# Patient Record
Sex: Female | Born: 1975
Health system: Southern US, Community
[De-identification: ages and names within clinical notes are randomized; demographics above are authoritative.]

## PROBLEM LIST (undated history)

## (undated) DIAGNOSIS — F329 Major depressive disorder, single episode, unspecified: Secondary | ICD-10-CM

## (undated) DIAGNOSIS — M329 Systemic lupus erythematosus, unspecified: Secondary | ICD-10-CM

## (undated) DIAGNOSIS — F32A Depression, unspecified: Secondary | ICD-10-CM

## (undated) DIAGNOSIS — I82409 Acute embolism and thrombosis of unspecified deep veins of unspecified lower extremity: Secondary | ICD-10-CM

## (undated) DIAGNOSIS — N289 Disorder of kidney and ureter, unspecified: Secondary | ICD-10-CM

## (undated) DIAGNOSIS — N898 Other specified noninflammatory disorders of vagina: Secondary | ICD-10-CM

## (undated) DIAGNOSIS — I10 Essential (primary) hypertension: Secondary | ICD-10-CM

## (undated) DIAGNOSIS — N760 Acute vaginitis: Secondary | ICD-10-CM

## (undated) DIAGNOSIS — B9689 Other specified bacterial agents as the cause of diseases classified elsewhere: Secondary | ICD-10-CM

## (undated) DIAGNOSIS — Z992 Dependence on renal dialysis: Secondary | ICD-10-CM

## (undated) DIAGNOSIS — IMO0002 Reserved for concepts with insufficient information to code with codable children: Secondary | ICD-10-CM

## (undated) DIAGNOSIS — D649 Anemia, unspecified: Secondary | ICD-10-CM

## (undated) HISTORY — DX: Depression, unspecified: F32.A

## (undated) HISTORY — DX: Other specified noninflammatory disorders of vagina: N89.8

## (undated) HISTORY — DX: Other specified bacterial agents as the cause of diseases classified elsewhere: B96.89

## (undated) HISTORY — DX: Acute vaginitis: N76.0

## (undated) HISTORY — PX: AV FISTULA PLACEMENT: SHX1204

## (undated) HISTORY — DX: Major depressive disorder, single episode, unspecified: F32.9

---

## 2004-12-24 ENCOUNTER — Inpatient Hospital Stay (HOSPITAL_COMMUNITY): Admission: EM | Admit: 2004-12-24 | Discharge: 2004-12-30 | Payer: Self-pay | Admitting: Emergency Medicine

## 2005-01-06 ENCOUNTER — Encounter (HOSPITAL_COMMUNITY): Admission: RE | Admit: 2005-01-06 | Discharge: 2005-02-05 | Payer: Self-pay | Admitting: Internal Medicine

## 2005-01-28 ENCOUNTER — Ambulatory Visit: Payer: Self-pay | Admitting: *Deleted

## 2005-01-29 ENCOUNTER — Ambulatory Visit: Payer: Self-pay | Admitting: Oncology

## 2005-01-29 ENCOUNTER — Inpatient Hospital Stay (HOSPITAL_COMMUNITY): Admission: EM | Admit: 2005-01-29 | Discharge: 2005-01-31 | Payer: Self-pay | Admitting: Emergency Medicine

## 2005-01-29 ENCOUNTER — Ambulatory Visit: Payer: Self-pay | Admitting: *Deleted

## 2005-02-14 ENCOUNTER — Encounter (HOSPITAL_COMMUNITY): Admission: RE | Admit: 2005-02-14 | Discharge: 2005-03-07 | Payer: Self-pay | Admitting: Internal Medicine

## 2005-02-24 ENCOUNTER — Encounter (HOSPITAL_COMMUNITY): Admission: RE | Admit: 2005-02-24 | Discharge: 2005-02-24 | Payer: Self-pay | Admitting: Oncology

## 2005-02-24 ENCOUNTER — Ambulatory Visit (HOSPITAL_COMMUNITY): Payer: Self-pay | Admitting: Oncology

## 2005-02-24 ENCOUNTER — Encounter: Admission: RE | Admit: 2005-02-24 | Discharge: 2005-02-24 | Payer: Self-pay | Admitting: Oncology

## 2005-06-02 ENCOUNTER — Encounter: Admission: RE | Admit: 2005-06-02 | Discharge: 2005-06-02 | Payer: Self-pay | Admitting: Oncology

## 2005-06-02 ENCOUNTER — Ambulatory Visit (HOSPITAL_COMMUNITY): Payer: Self-pay | Admitting: Oncology

## 2005-06-02 ENCOUNTER — Encounter (HOSPITAL_COMMUNITY): Admission: RE | Admit: 2005-06-02 | Discharge: 2005-07-02 | Payer: Self-pay | Admitting: Oncology

## 2005-06-10 ENCOUNTER — Encounter (INDEPENDENT_AMBULATORY_CARE_PROVIDER_SITE_OTHER): Payer: Self-pay | Admitting: *Deleted

## 2005-06-10 ENCOUNTER — Ambulatory Visit: Payer: Self-pay | Admitting: Internal Medicine

## 2005-07-09 ENCOUNTER — Ambulatory Visit: Payer: Self-pay | Admitting: Internal Medicine

## 2005-08-12 ENCOUNTER — Ambulatory Visit: Payer: Self-pay | Admitting: Internal Medicine

## 2005-08-12 ENCOUNTER — Other Ambulatory Visit: Admission: RE | Admit: 2005-08-12 | Discharge: 2005-08-12 | Payer: Self-pay | Admitting: Internal Medicine

## 2005-08-25 ENCOUNTER — Emergency Department (HOSPITAL_COMMUNITY): Admission: EM | Admit: 2005-08-25 | Discharge: 2005-08-25 | Payer: Self-pay | Admitting: Emergency Medicine

## 2006-03-31 ENCOUNTER — Encounter: Payer: Self-pay | Admitting: Internal Medicine

## 2006-03-31 DIAGNOSIS — G43909 Migraine, unspecified, not intractable, without status migrainosus: Secondary | ICD-10-CM | POA: Insufficient documentation

## 2006-03-31 DIAGNOSIS — G609 Hereditary and idiopathic neuropathy, unspecified: Secondary | ICD-10-CM | POA: Insufficient documentation

## 2006-03-31 DIAGNOSIS — F329 Major depressive disorder, single episode, unspecified: Secondary | ICD-10-CM

## 2006-03-31 DIAGNOSIS — F3289 Other specified depressive episodes: Secondary | ICD-10-CM | POA: Insufficient documentation

## 2006-03-31 DIAGNOSIS — F418 Other specified anxiety disorders: Secondary | ICD-10-CM | POA: Insufficient documentation

## 2006-03-31 DIAGNOSIS — D7289 Other specified disorders of white blood cells: Secondary | ICD-10-CM | POA: Insufficient documentation

## 2006-03-31 DIAGNOSIS — M1990 Unspecified osteoarthritis, unspecified site: Secondary | ICD-10-CM | POA: Insufficient documentation

## 2006-03-31 DIAGNOSIS — R7989 Other specified abnormal findings of blood chemistry: Secondary | ICD-10-CM | POA: Insufficient documentation

## 2006-03-31 DIAGNOSIS — I10 Essential (primary) hypertension: Secondary | ICD-10-CM | POA: Insufficient documentation

## 2006-03-31 DIAGNOSIS — D649 Anemia, unspecified: Secondary | ICD-10-CM | POA: Insufficient documentation

## 2006-03-31 DIAGNOSIS — K219 Gastro-esophageal reflux disease without esophagitis: Secondary | ICD-10-CM | POA: Insufficient documentation

## 2006-04-01 DIAGNOSIS — M329 Systemic lupus erythematosus, unspecified: Secondary | ICD-10-CM | POA: Insufficient documentation

## 2006-04-19 ENCOUNTER — Observation Stay (HOSPITAL_COMMUNITY): Admission: EM | Admit: 2006-04-19 | Discharge: 2006-04-24 | Payer: Self-pay | Admitting: Emergency Medicine

## 2006-04-24 ENCOUNTER — Ambulatory Visit (HOSPITAL_COMMUNITY): Admission: RE | Admit: 2006-04-24 | Discharge: 2006-04-24 | Payer: Self-pay | Admitting: Internal Medicine

## 2006-04-29 ENCOUNTER — Inpatient Hospital Stay (HOSPITAL_COMMUNITY): Admission: EM | Admit: 2006-04-29 | Discharge: 2006-05-07 | Payer: Self-pay | Admitting: Emergency Medicine

## 2006-04-30 ENCOUNTER — Encounter (INDEPENDENT_AMBULATORY_CARE_PROVIDER_SITE_OTHER): Payer: Self-pay | Admitting: Internal Medicine

## 2006-05-01 ENCOUNTER — Encounter (INDEPENDENT_AMBULATORY_CARE_PROVIDER_SITE_OTHER): Payer: Self-pay | Admitting: Internal Medicine

## 2006-05-08 ENCOUNTER — Encounter (INDEPENDENT_AMBULATORY_CARE_PROVIDER_SITE_OTHER): Payer: Self-pay | Admitting: Internal Medicine

## 2006-05-11 ENCOUNTER — Telehealth (INDEPENDENT_AMBULATORY_CARE_PROVIDER_SITE_OTHER): Payer: Self-pay | Admitting: *Deleted

## 2006-05-14 ENCOUNTER — Encounter (INDEPENDENT_AMBULATORY_CARE_PROVIDER_SITE_OTHER): Payer: Self-pay | Admitting: Internal Medicine

## 2006-05-15 ENCOUNTER — Inpatient Hospital Stay (HOSPITAL_COMMUNITY): Admission: EM | Admit: 2006-05-15 | Discharge: 2006-05-22 | Payer: Self-pay | Admitting: Emergency Medicine

## 2006-05-15 ENCOUNTER — Ambulatory Visit: Payer: Self-pay | Admitting: Internal Medicine

## 2006-05-15 ENCOUNTER — Emergency Department (HOSPITAL_COMMUNITY): Admission: EM | Admit: 2006-05-15 | Discharge: 2006-05-15 | Payer: Self-pay | Admitting: Emergency Medicine

## 2006-05-15 DIAGNOSIS — I959 Hypotension, unspecified: Secondary | ICD-10-CM

## 2006-05-15 DIAGNOSIS — I319 Disease of pericardium, unspecified: Secondary | ICD-10-CM | POA: Insufficient documentation

## 2006-05-15 DIAGNOSIS — R000 Tachycardia, unspecified: Secondary | ICD-10-CM | POA: Insufficient documentation

## 2006-05-17 LAB — CONVERTED CEMR LAB
CO2: 20 meq/L
Creatinine, Ser: 0.65 mg/dL
GFR calc non Af Amer: >60 mL/min
Glucose, Bld: 69 mg/dL

## 2006-05-18 ENCOUNTER — Encounter (INDEPENDENT_AMBULATORY_CARE_PROVIDER_SITE_OTHER): Payer: Self-pay | Admitting: Internal Medicine

## 2006-05-18 ENCOUNTER — Ambulatory Visit: Payer: Self-pay | Admitting: Cardiology

## 2006-05-20 LAB — CONVERTED CEMR LAB
Basophils Relative: 0 %
Eosinophils Absolute: 0.1 10*3/uL
MCHC: 33.7 g/dL
MCV: 86.2 fL
Monocytes Relative: 10 %
Neutrophils Relative %: 57 %
Platelets: 340 10*3/uL
RBC: 3.56 M/uL

## 2006-05-25 ENCOUNTER — Telehealth (INDEPENDENT_AMBULATORY_CARE_PROVIDER_SITE_OTHER): Payer: Self-pay | Admitting: Internal Medicine

## 2006-05-26 ENCOUNTER — Ambulatory Visit: Payer: Self-pay | Admitting: Internal Medicine

## 2006-05-26 DIAGNOSIS — R630 Anorexia: Secondary | ICD-10-CM | POA: Insufficient documentation

## 2006-05-27 ENCOUNTER — Encounter (INDEPENDENT_AMBULATORY_CARE_PROVIDER_SITE_OTHER): Payer: Self-pay | Admitting: Internal Medicine

## 2006-05-29 ENCOUNTER — Encounter (INDEPENDENT_AMBULATORY_CARE_PROVIDER_SITE_OTHER): Payer: Self-pay | Admitting: Internal Medicine

## 2006-06-03 ENCOUNTER — Encounter (INDEPENDENT_AMBULATORY_CARE_PROVIDER_SITE_OTHER): Payer: Self-pay | Admitting: Internal Medicine

## 2006-06-08 ENCOUNTER — Encounter (INDEPENDENT_AMBULATORY_CARE_PROVIDER_SITE_OTHER): Payer: Self-pay | Admitting: Internal Medicine

## 2006-06-09 ENCOUNTER — Encounter (INDEPENDENT_AMBULATORY_CARE_PROVIDER_SITE_OTHER): Payer: Self-pay | Admitting: Internal Medicine

## 2006-06-09 ENCOUNTER — Telehealth (INDEPENDENT_AMBULATORY_CARE_PROVIDER_SITE_OTHER): Payer: Self-pay | Admitting: Internal Medicine

## 2006-06-23 ENCOUNTER — Encounter (INDEPENDENT_AMBULATORY_CARE_PROVIDER_SITE_OTHER): Payer: Self-pay | Admitting: Internal Medicine

## 2006-06-25 ENCOUNTER — Ambulatory Visit: Payer: Self-pay | Admitting: Internal Medicine

## 2006-06-25 ENCOUNTER — Inpatient Hospital Stay (HOSPITAL_COMMUNITY): Admission: EM | Admit: 2006-06-25 | Discharge: 2006-07-04 | Payer: Self-pay | Admitting: Emergency Medicine

## 2006-06-30 ENCOUNTER — Ambulatory Visit: Payer: Self-pay | Admitting: Internal Medicine

## 2006-07-01 ENCOUNTER — Ambulatory Visit: Payer: Self-pay | Admitting: Gastroenterology

## 2006-07-07 ENCOUNTER — Telehealth (INDEPENDENT_AMBULATORY_CARE_PROVIDER_SITE_OTHER): Payer: Self-pay | Admitting: Internal Medicine

## 2006-07-08 ENCOUNTER — Telehealth (INDEPENDENT_AMBULATORY_CARE_PROVIDER_SITE_OTHER): Payer: Self-pay | Admitting: Internal Medicine

## 2006-07-11 ENCOUNTER — Emergency Department (HOSPITAL_COMMUNITY): Admission: EM | Admit: 2006-07-11 | Discharge: 2006-07-11 | Payer: Self-pay | Admitting: Emergency Medicine

## 2006-07-13 ENCOUNTER — Encounter: Payer: Self-pay | Admitting: Internal Medicine

## 2006-07-14 ENCOUNTER — Encounter (INDEPENDENT_AMBULATORY_CARE_PROVIDER_SITE_OTHER): Payer: Self-pay | Admitting: Internal Medicine

## 2006-07-16 ENCOUNTER — Ambulatory Visit: Payer: Self-pay | Admitting: Internal Medicine

## 2006-07-16 DIAGNOSIS — R4182 Altered mental status, unspecified: Secondary | ICD-10-CM | POA: Insufficient documentation

## 2006-07-16 LAB — CONVERTED CEMR LAB
Calcium: 9 mg/dL (ref 8.4–10.5)
Creatinine, Ser: 0.59 mg/dL (ref 0.40–1.20)
Glucose, Bld: 77 mg/dL (ref 70–99)
Sodium: 137 meq/L (ref 135–145)

## 2006-07-17 ENCOUNTER — Encounter (INDEPENDENT_AMBULATORY_CARE_PROVIDER_SITE_OTHER): Payer: Self-pay | Admitting: Internal Medicine

## 2006-07-23 ENCOUNTER — Encounter (INDEPENDENT_AMBULATORY_CARE_PROVIDER_SITE_OTHER): Payer: Self-pay | Admitting: Internal Medicine

## 2006-07-24 ENCOUNTER — Ambulatory Visit: Payer: Self-pay | Admitting: Internal Medicine

## 2006-07-27 ENCOUNTER — Encounter (INDEPENDENT_AMBULATORY_CARE_PROVIDER_SITE_OTHER): Payer: Self-pay | Admitting: Internal Medicine

## 2006-07-27 ENCOUNTER — Telehealth (INDEPENDENT_AMBULATORY_CARE_PROVIDER_SITE_OTHER): Payer: Self-pay | Admitting: Internal Medicine

## 2006-07-28 ENCOUNTER — Encounter (INDEPENDENT_AMBULATORY_CARE_PROVIDER_SITE_OTHER): Payer: Self-pay | Admitting: Internal Medicine

## 2006-07-31 ENCOUNTER — Ambulatory Visit: Payer: Self-pay | Admitting: Internal Medicine

## 2006-08-04 ENCOUNTER — Encounter (INDEPENDENT_AMBULATORY_CARE_PROVIDER_SITE_OTHER): Payer: Self-pay | Admitting: Internal Medicine

## 2006-08-11 ENCOUNTER — Ambulatory Visit: Payer: Self-pay | Admitting: Internal Medicine

## 2006-08-14 ENCOUNTER — Encounter (INDEPENDENT_AMBULATORY_CARE_PROVIDER_SITE_OTHER): Payer: Self-pay | Admitting: Internal Medicine

## 2006-08-31 ENCOUNTER — Ambulatory Visit: Payer: Self-pay | Admitting: Internal Medicine

## 2006-08-31 LAB — CONVERTED CEMR LAB
ALT: 18 units/L (ref 0–35)
Albumin: 4.5 g/dL (ref 3.5–5.2)
CO2: 25 meq/L (ref 19–32)
Calcium: 10 mg/dL (ref 8.4–10.5)
Chloride: 106 meq/L (ref 96–112)
Eosinophils Relative: 0 % (ref 0–5)
Glucose, Bld: 59 mg/dL — ABNORMAL LOW (ref 70–99)
HCT: 45 % (ref 36.0–46.0)
Lymphocytes Relative: 20 % (ref 12–46)
Lymphs Abs: 1.7 10*3/uL (ref 0.7–3.3)
Neutro Abs: 5.8 10*3/uL (ref 1.7–7.7)
Neutrophils Relative %: 70 % (ref 43–77)
Platelets: 320 10*3/uL (ref 150–400)
Potassium: 4.3 meq/L (ref 3.5–5.3)
Sodium: 143 meq/L (ref 135–145)
TSH: 0.798 microintl units/mL (ref 0.350–5.50)
Total Protein: 7.3 g/dL (ref 6.0–8.3)
WBC: 8.3 10*3/uL (ref 4.0–10.5)

## 2006-09-17 ENCOUNTER — Telehealth (INDEPENDENT_AMBULATORY_CARE_PROVIDER_SITE_OTHER): Payer: Self-pay | Admitting: *Deleted

## 2006-09-22 ENCOUNTER — Encounter (INDEPENDENT_AMBULATORY_CARE_PROVIDER_SITE_OTHER): Payer: Self-pay | Admitting: Internal Medicine

## 2006-10-05 ENCOUNTER — Ambulatory Visit: Payer: Self-pay | Admitting: Internal Medicine

## 2006-10-13 ENCOUNTER — Encounter (INDEPENDENT_AMBULATORY_CARE_PROVIDER_SITE_OTHER): Payer: Self-pay | Admitting: Internal Medicine

## 2006-11-02 ENCOUNTER — Ambulatory Visit: Payer: Self-pay | Admitting: Internal Medicine

## 2006-11-23 ENCOUNTER — Encounter (INDEPENDENT_AMBULATORY_CARE_PROVIDER_SITE_OTHER): Payer: Self-pay | Admitting: Internal Medicine

## 2006-11-24 ENCOUNTER — Encounter (INDEPENDENT_AMBULATORY_CARE_PROVIDER_SITE_OTHER): Payer: Self-pay | Admitting: Internal Medicine

## 2006-11-26 ENCOUNTER — Telehealth (INDEPENDENT_AMBULATORY_CARE_PROVIDER_SITE_OTHER): Payer: Self-pay | Admitting: *Deleted

## 2006-12-01 ENCOUNTER — Encounter (INDEPENDENT_AMBULATORY_CARE_PROVIDER_SITE_OTHER): Payer: Self-pay | Admitting: Internal Medicine

## 2006-12-01 ENCOUNTER — Ambulatory Visit: Payer: Self-pay | Admitting: Internal Medicine

## 2006-12-01 ENCOUNTER — Telehealth (INDEPENDENT_AMBULATORY_CARE_PROVIDER_SITE_OTHER): Payer: Self-pay | Admitting: Internal Medicine

## 2006-12-01 ENCOUNTER — Other Ambulatory Visit: Admission: RE | Admit: 2006-12-01 | Discharge: 2006-12-01 | Payer: Self-pay | Admitting: Internal Medicine

## 2006-12-02 ENCOUNTER — Encounter (INDEPENDENT_AMBULATORY_CARE_PROVIDER_SITE_OTHER): Payer: Self-pay | Admitting: Internal Medicine

## 2006-12-02 ENCOUNTER — Ambulatory Visit (HOSPITAL_COMMUNITY): Payer: Self-pay | Admitting: Psychology

## 2006-12-04 ENCOUNTER — Telehealth (INDEPENDENT_AMBULATORY_CARE_PROVIDER_SITE_OTHER): Payer: Self-pay | Admitting: *Deleted

## 2006-12-09 ENCOUNTER — Telehealth (INDEPENDENT_AMBULATORY_CARE_PROVIDER_SITE_OTHER): Payer: Self-pay | Admitting: Internal Medicine

## 2006-12-12 ENCOUNTER — Encounter (INDEPENDENT_AMBULATORY_CARE_PROVIDER_SITE_OTHER): Payer: Self-pay | Admitting: Internal Medicine

## 2006-12-16 ENCOUNTER — Ambulatory Visit (HOSPITAL_COMMUNITY): Payer: Self-pay | Admitting: Psychology

## 2006-12-24 ENCOUNTER — Telehealth (INDEPENDENT_AMBULATORY_CARE_PROVIDER_SITE_OTHER): Payer: Self-pay | Admitting: *Deleted

## 2006-12-25 ENCOUNTER — Ambulatory Visit: Payer: Self-pay | Admitting: Internal Medicine

## 2006-12-30 ENCOUNTER — Emergency Department (HOSPITAL_COMMUNITY): Admission: EM | Admit: 2006-12-30 | Discharge: 2006-12-30 | Payer: Self-pay | Admitting: Emergency Medicine

## 2007-01-04 ENCOUNTER — Telehealth (INDEPENDENT_AMBULATORY_CARE_PROVIDER_SITE_OTHER): Payer: Self-pay | Admitting: *Deleted

## 2007-01-07 ENCOUNTER — Telehealth (INDEPENDENT_AMBULATORY_CARE_PROVIDER_SITE_OTHER): Payer: Self-pay | Admitting: *Deleted

## 2007-01-19 ENCOUNTER — Ambulatory Visit: Payer: Self-pay | Admitting: Internal Medicine

## 2007-01-19 DIAGNOSIS — F71 Moderate intellectual disabilities: Secondary | ICD-10-CM | POA: Insufficient documentation

## 2007-01-26 ENCOUNTER — Telehealth (INDEPENDENT_AMBULATORY_CARE_PROVIDER_SITE_OTHER): Payer: Self-pay | Admitting: Internal Medicine

## 2007-01-29 ENCOUNTER — Encounter (INDEPENDENT_AMBULATORY_CARE_PROVIDER_SITE_OTHER): Payer: Self-pay | Admitting: Internal Medicine

## 2007-02-04 ENCOUNTER — Telehealth (INDEPENDENT_AMBULATORY_CARE_PROVIDER_SITE_OTHER): Payer: Self-pay | Admitting: Internal Medicine

## 2007-02-09 ENCOUNTER — Encounter (INDEPENDENT_AMBULATORY_CARE_PROVIDER_SITE_OTHER): Payer: Self-pay | Admitting: Internal Medicine

## 2007-03-15 ENCOUNTER — Ambulatory Visit: Payer: Self-pay | Admitting: Internal Medicine

## 2007-03-15 DIAGNOSIS — R059 Cough, unspecified: Secondary | ICD-10-CM | POA: Insufficient documentation

## 2007-03-15 DIAGNOSIS — J45909 Unspecified asthma, uncomplicated: Secondary | ICD-10-CM | POA: Insufficient documentation

## 2007-03-15 DIAGNOSIS — R05 Cough: Secondary | ICD-10-CM

## 2007-03-15 DIAGNOSIS — M79609 Pain in unspecified limb: Secondary | ICD-10-CM | POA: Insufficient documentation

## 2007-03-23 ENCOUNTER — Ambulatory Visit (HOSPITAL_COMMUNITY): Admission: RE | Admit: 2007-03-23 | Discharge: 2007-03-23 | Payer: Self-pay | Admitting: Internal Medicine

## 2007-03-24 ENCOUNTER — Telehealth (INDEPENDENT_AMBULATORY_CARE_PROVIDER_SITE_OTHER): Payer: Self-pay | Admitting: *Deleted

## 2007-04-02 ENCOUNTER — Inpatient Hospital Stay (HOSPITAL_COMMUNITY): Admission: EM | Admit: 2007-04-02 | Discharge: 2007-04-23 | Payer: Self-pay

## 2007-04-02 ENCOUNTER — Encounter: Payer: Self-pay | Admitting: Emergency Medicine

## 2007-04-02 ENCOUNTER — Ambulatory Visit: Payer: Self-pay | Admitting: Internal Medicine

## 2007-04-02 ENCOUNTER — Telehealth (INDEPENDENT_AMBULATORY_CARE_PROVIDER_SITE_OTHER): Payer: Self-pay | Admitting: *Deleted

## 2007-04-02 DIAGNOSIS — R220 Localized swelling, mass and lump, head: Secondary | ICD-10-CM | POA: Insufficient documentation

## 2007-04-02 DIAGNOSIS — R221 Localized swelling, mass and lump, neck: Secondary | ICD-10-CM

## 2007-04-19 ENCOUNTER — Encounter: Payer: Self-pay | Admitting: Internal Medicine

## 2007-04-20 ENCOUNTER — Ambulatory Visit: Payer: Self-pay | Admitting: Internal Medicine

## 2007-04-28 ENCOUNTER — Telehealth (INDEPENDENT_AMBULATORY_CARE_PROVIDER_SITE_OTHER): Payer: Self-pay | Admitting: Internal Medicine

## 2007-04-30 ENCOUNTER — Telehealth (INDEPENDENT_AMBULATORY_CARE_PROVIDER_SITE_OTHER): Payer: Self-pay | Admitting: *Deleted

## 2007-05-14 ENCOUNTER — Encounter (HOSPITAL_COMMUNITY): Admission: RE | Admit: 2007-05-14 | Discharge: 2007-05-21 | Payer: Self-pay | Admitting: Nephrology

## 2007-08-25 ENCOUNTER — Ambulatory Visit: Payer: Self-pay | Admitting: Internal Medicine

## 2007-08-27 ENCOUNTER — Ambulatory Visit (HOSPITAL_COMMUNITY): Admission: RE | Admit: 2007-08-27 | Discharge: 2007-08-27 | Payer: Self-pay | Admitting: Internal Medicine

## 2007-09-02 ENCOUNTER — Encounter (HOSPITAL_COMMUNITY): Admission: RE | Admit: 2007-09-02 | Discharge: 2007-10-02 | Payer: Self-pay | Admitting: Internal Medicine

## 2007-09-06 ENCOUNTER — Emergency Department (HOSPITAL_COMMUNITY): Admission: EM | Admit: 2007-09-06 | Discharge: 2007-09-06 | Payer: Self-pay | Admitting: Emergency Medicine

## 2007-10-15 ENCOUNTER — Ambulatory Visit (HOSPITAL_COMMUNITY): Admission: RE | Admit: 2007-10-15 | Discharge: 2007-10-15 | Payer: Self-pay | Admitting: Internal Medicine

## 2007-10-28 ENCOUNTER — Inpatient Hospital Stay (HOSPITAL_COMMUNITY): Admission: AD | Admit: 2007-10-28 | Discharge: 2007-11-03 | Payer: Self-pay | Admitting: Internal Medicine

## 2007-12-14 ENCOUNTER — Ambulatory Visit (HOSPITAL_COMMUNITY): Admission: RE | Admit: 2007-12-14 | Discharge: 2007-12-14 | Payer: Self-pay | Admitting: Internal Medicine

## 2007-12-16 ENCOUNTER — Inpatient Hospital Stay (HOSPITAL_COMMUNITY): Admission: EM | Admit: 2007-12-16 | Discharge: 2007-12-28 | Payer: Self-pay | Admitting: Emergency Medicine

## 2007-12-21 ENCOUNTER — Other Ambulatory Visit: Payer: Self-pay | Admitting: Surgery

## 2007-12-21 ENCOUNTER — Ambulatory Visit: Payer: Self-pay | Admitting: Surgery

## 2007-12-31 ENCOUNTER — Inpatient Hospital Stay (HOSPITAL_COMMUNITY): Admission: EM | Admit: 2007-12-31 | Discharge: 2008-01-20 | Payer: Self-pay | Admitting: Emergency Medicine

## 2007-12-31 ENCOUNTER — Ambulatory Visit: Payer: Self-pay | Admitting: Cardiology

## 2008-01-11 ENCOUNTER — Encounter: Payer: Self-pay | Admitting: Cardiology

## 2008-01-11 ENCOUNTER — Ambulatory Visit: Payer: Self-pay | Admitting: Internal Medicine

## 2008-01-12 ENCOUNTER — Ambulatory Visit: Payer: Self-pay | Admitting: Internal Medicine

## 2008-01-15 ENCOUNTER — Ambulatory Visit: Payer: Self-pay | Admitting: Gastroenterology

## 2008-01-17 ENCOUNTER — Encounter: Payer: Self-pay | Admitting: Cardiology

## 2008-02-01 ENCOUNTER — Inpatient Hospital Stay (HOSPITAL_COMMUNITY): Admission: EM | Admit: 2008-02-01 | Discharge: 2008-02-09 | Payer: Self-pay | Admitting: Emergency Medicine

## 2008-02-03 ENCOUNTER — Encounter (INDEPENDENT_AMBULATORY_CARE_PROVIDER_SITE_OTHER): Payer: Self-pay | Admitting: Diagnostic Radiology

## 2008-02-09 ENCOUNTER — Inpatient Hospital Stay (HOSPITAL_COMMUNITY): Admission: EM | Admit: 2008-02-09 | Discharge: 2008-02-17 | Payer: Self-pay | Admitting: Emergency Medicine

## 2008-02-14 ENCOUNTER — Ambulatory Visit: Payer: Self-pay | Admitting: Internal Medicine

## 2008-02-15 ENCOUNTER — Ambulatory Visit: Payer: Self-pay | Admitting: Internal Medicine

## 2008-02-16 ENCOUNTER — Ambulatory Visit: Payer: Self-pay | Admitting: Internal Medicine

## 2008-04-10 ENCOUNTER — Telehealth (INDEPENDENT_AMBULATORY_CARE_PROVIDER_SITE_OTHER): Payer: Self-pay | Admitting: Internal Medicine

## 2008-04-13 ENCOUNTER — Encounter (INDEPENDENT_AMBULATORY_CARE_PROVIDER_SITE_OTHER): Payer: Self-pay | Admitting: Internal Medicine

## 2008-10-06 ENCOUNTER — Ambulatory Visit (HOSPITAL_COMMUNITY): Admission: RE | Admit: 2008-10-06 | Discharge: 2008-10-06 | Payer: Self-pay | Admitting: Neurosurgery

## 2009-01-29 ENCOUNTER — Ambulatory Visit (HOSPITAL_COMMUNITY): Admission: RE | Admit: 2009-01-29 | Discharge: 2009-01-29 | Payer: Self-pay | Admitting: Nephrology

## 2009-03-06 ENCOUNTER — Ambulatory Visit (HOSPITAL_COMMUNITY): Admission: RE | Admit: 2009-03-06 | Discharge: 2009-03-06 | Payer: Self-pay | Admitting: Nephrology

## 2009-06-26 ENCOUNTER — Ambulatory Visit (HOSPITAL_COMMUNITY): Admission: RE | Admit: 2009-06-26 | Discharge: 2009-06-26 | Payer: Self-pay | Admitting: Nephrology

## 2010-04-07 ENCOUNTER — Encounter: Payer: Self-pay | Admitting: Internal Medicine

## 2010-06-12 IMAGING — CR DG CHEST 1V PORT
1 series · 1 of 1 positions shown · non-contrast
Comparison: Chest radiograph 01/01/2008 and 12/23/2007.

CLINICAL DATA: Pneumonia.  On ventilator.

PORTABLE CHEST - 1 VIEW

[view not recorded]
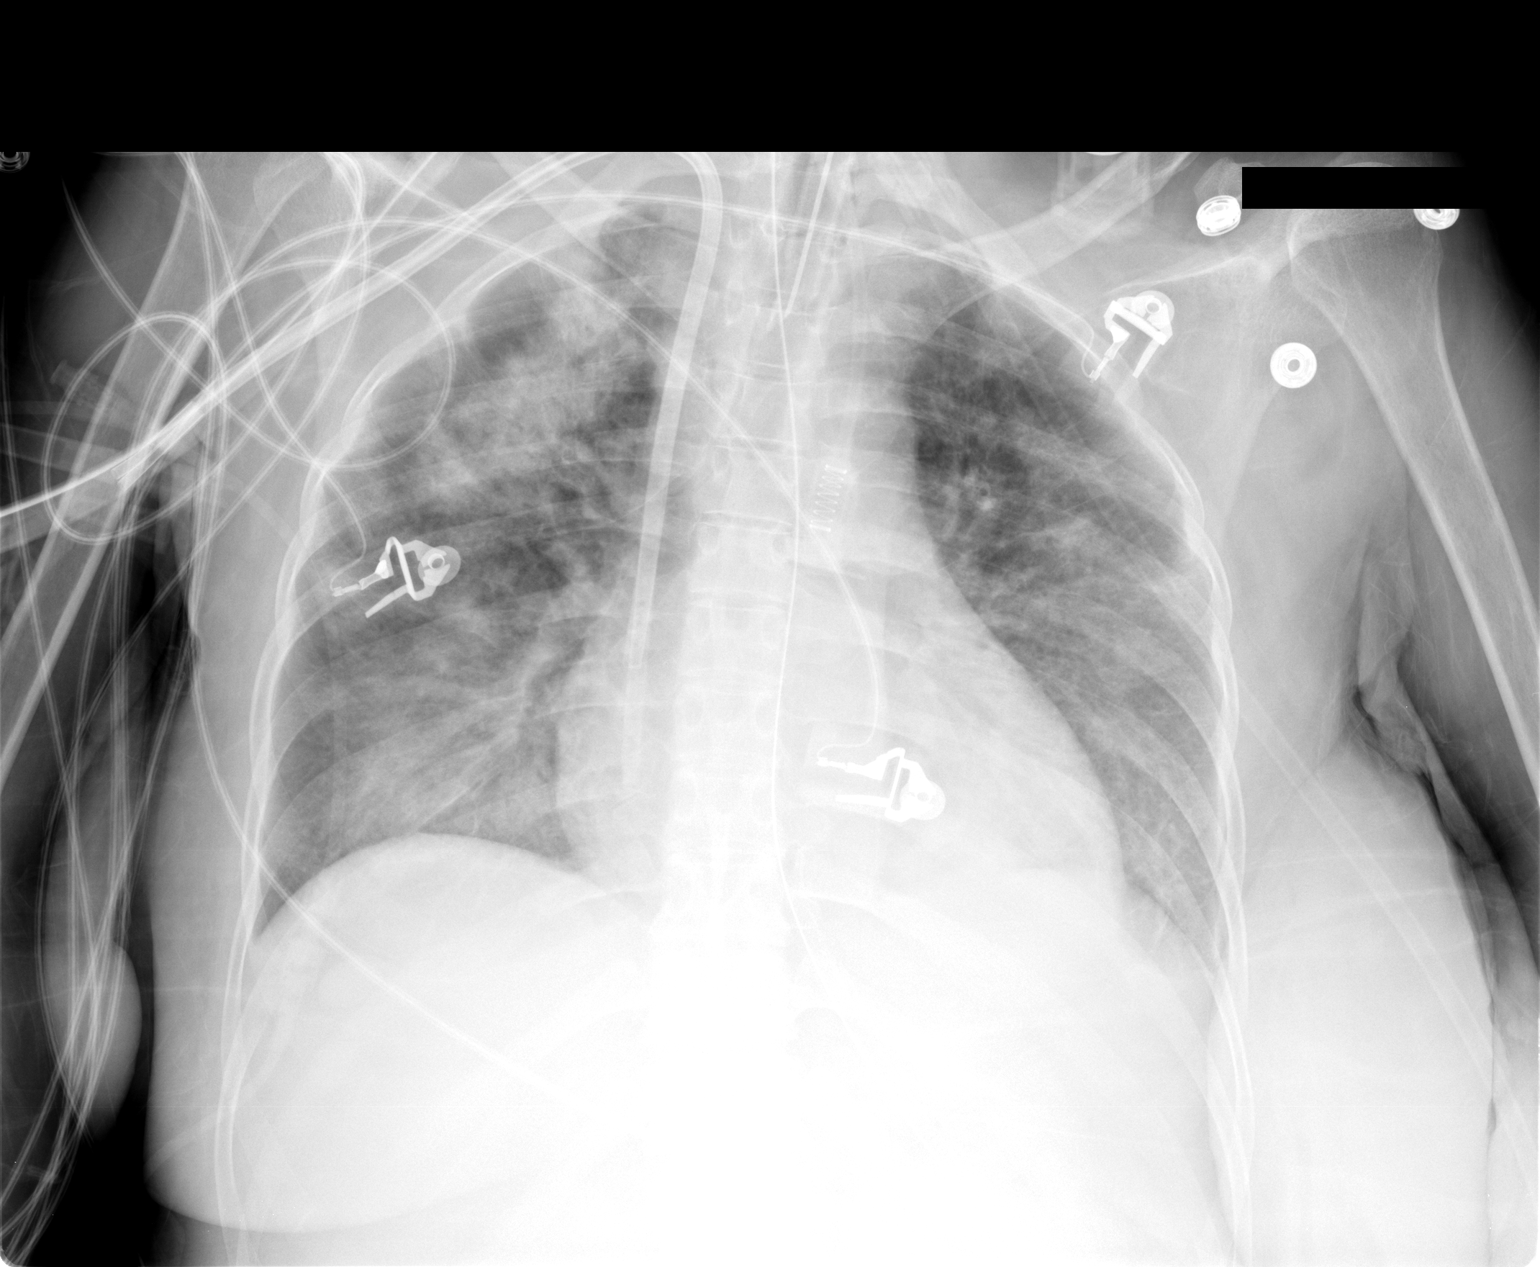

[1 of 1 positions shown; findings below may reference images not displayed]

FINDINGS: The endotracheal tube remains in satisfactory position,
with the tip at the level of the clavicular heads.  A right jugular
dialysis catheter is present, with one distal tip near the superior
cavoatrial junction and one tip in the right atrium.

A nasogastric tube has been placed, which courses into the stomach,
and continues below the edge of the image.

There has been interval improvement in airspace disease in the
right lung compared to 01/01/2008.  Currently, there is patchy
bilateral airspace disease, right greater than left.  Mild
pulmonary vascular congestion is present.  No definite pleural
effusion is appreciated.
IMPRESSION: 1.Bilateral airspace disease.  Improvement in aeration,
particularly in the right lung, compared to 01/01/2008.
2.  Nasogastric tube courses into the stomach but its distal tip is
not included on this image.

## 2010-06-14 IMAGING — CR DG CHEST 1V PORT
1 series · 1 of 1 positions shown · non-contrast
Comparison: 01/02/2008.

CLINICAL DATA: Pneumonia.

PORTABLE CHEST - 1 VIEW

[view not recorded]
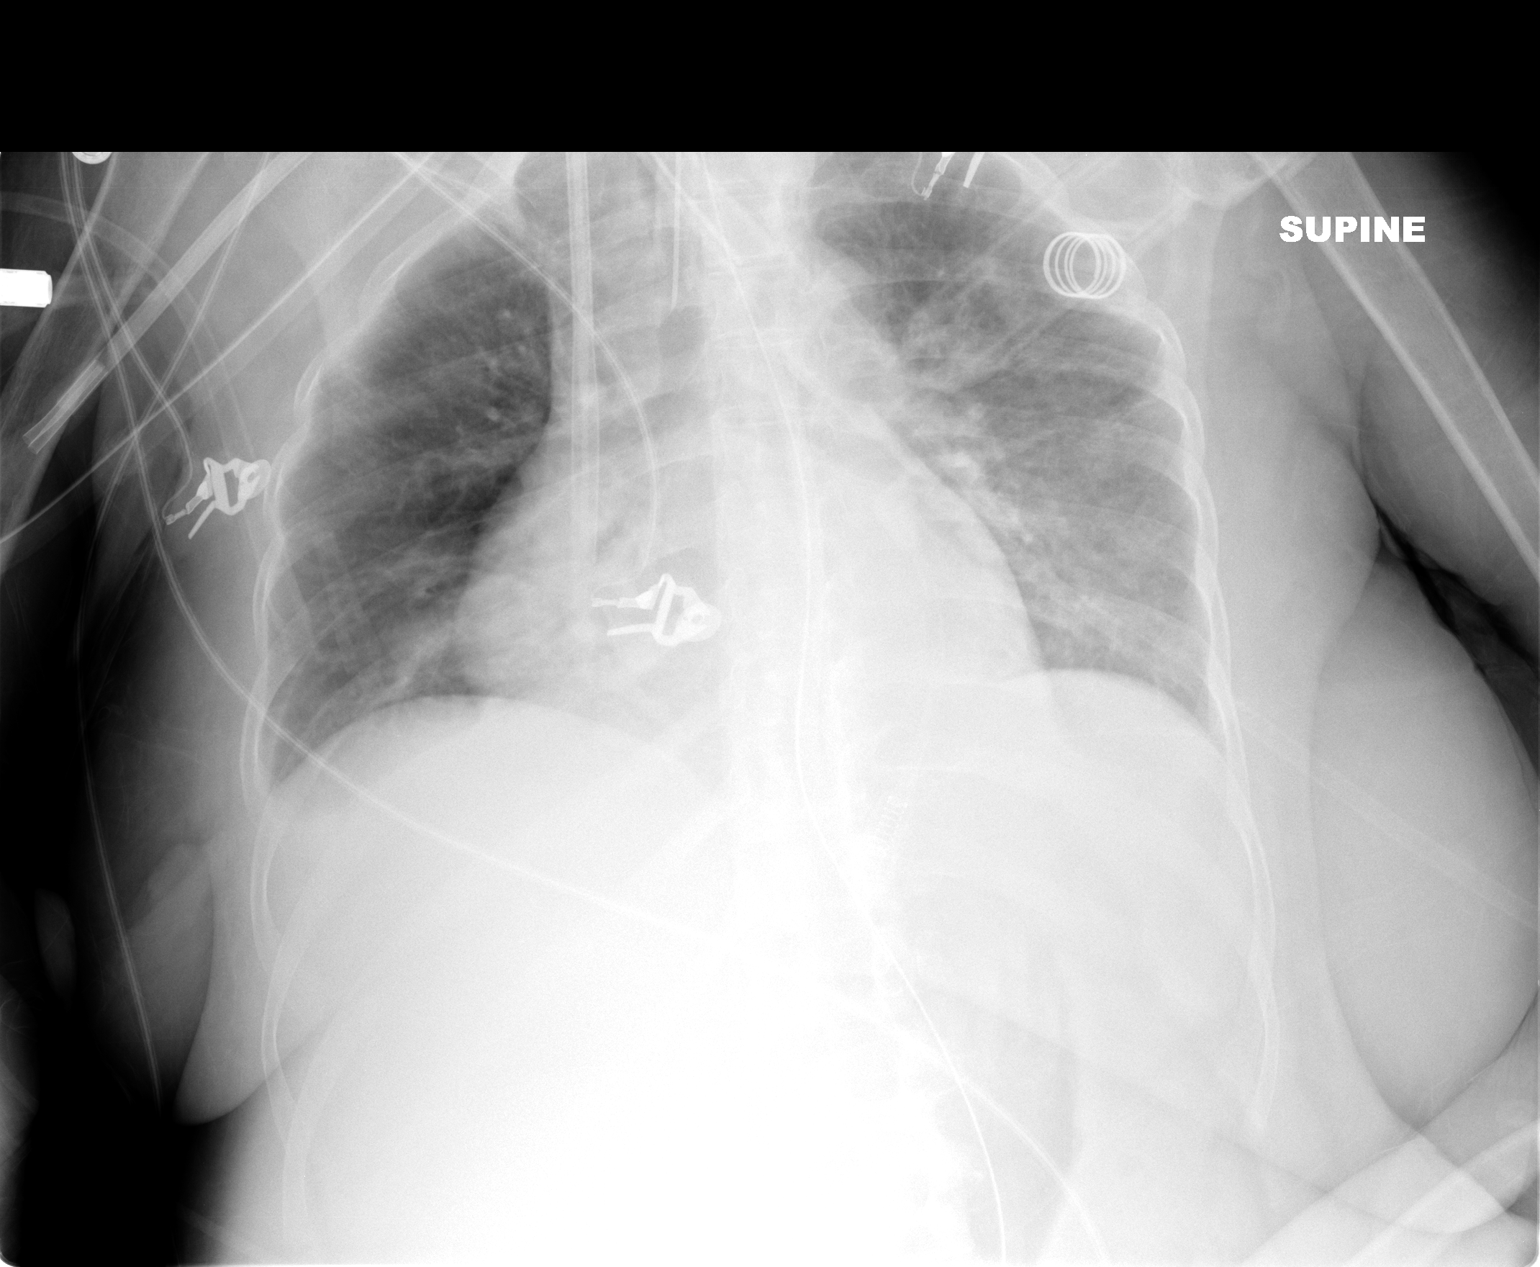

[1 of 1 positions shown; findings below may reference images not displayed]

FINDINGS: The patient remains intubated.  Endotracheal tube
terminates 3 cm above the carina.  An NG tube courses off the
inferior border the film.  The right IJ dialysis catheter is stable
position.  Previously seen airspace disease is mostly cleared.
Mild edema persists.  Minimal bibasilar atelectasis is noted.  The
patient is rotated to the right.
IMPRESSION: 1.  Interval resolution of right upper lobe airspace disease.
2.  Low lung volumes and mild edema persists.

## 2010-06-17 IMAGING — CR DG CHEST 1V PORT
1 series · 1 of 1 positions shown · non-contrast
Comparison: Portable chest 01/07/2008.

CLINICAL DATA: PICC placement.  Pneumonia.

PORTABLE CHEST - 1 VIEW

[view not recorded]
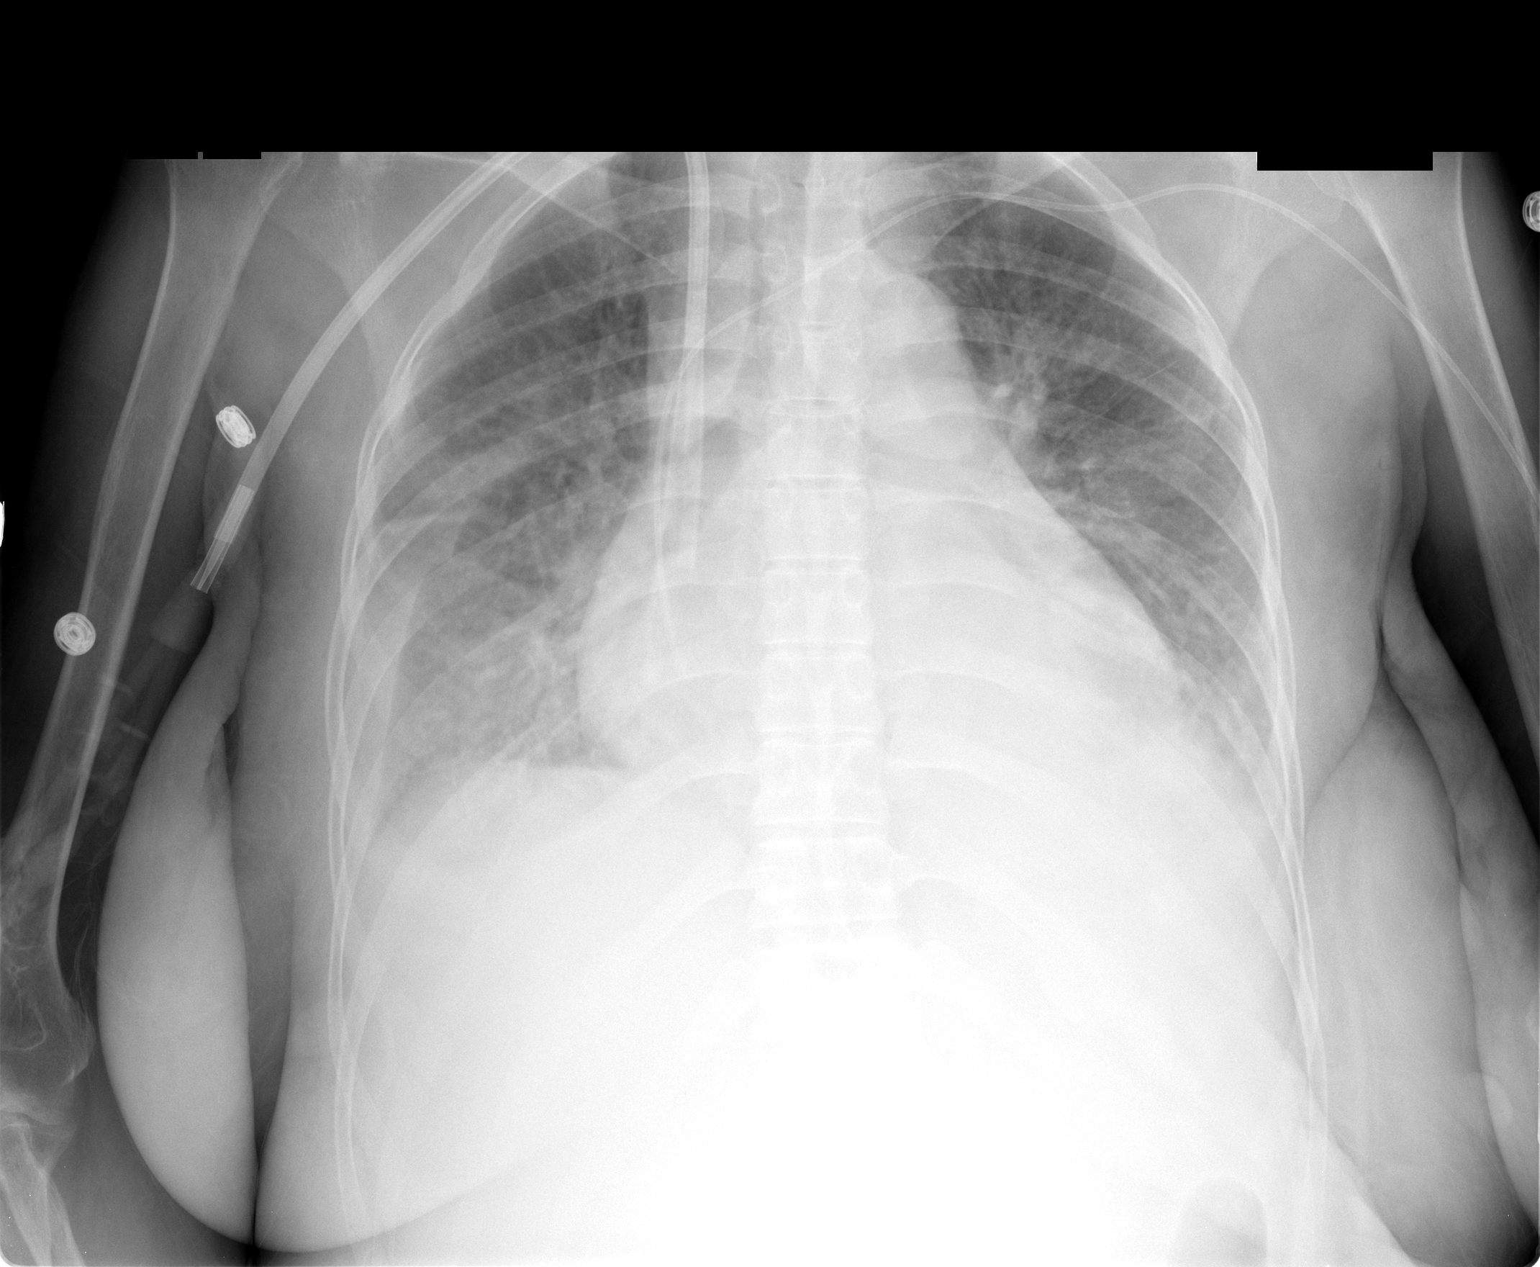

[1 of 1 positions shown; findings below may reference images not displayed]

FINDINGS: The patient has a new left PICC with the tip in the right
atrium.  The catheter should be withdrawn approximately 4 cm for
better positioning.  There is no pneumothorax.

There has been interval progression in bibasilar airspace disease
and a right pleural effusion.  Enlargement of the cardiopericardial
silhouette again noted.
IMPRESSION: 1.  Left PICC has its tip in the right atrium and should be
withdrawn approximately 4 cm.
2.  Some increase in bibasilar airspace disease and small right
effusion.

## 2010-06-19 LAB — GLUCOSE, CAPILLARY: Glucose-Capillary: 76 mg/dL (ref 70–99)

## 2010-06-22 IMAGING — CR DG CHEST 1V PORT
1 series · 1 of 1 positions shown · non-contrast
Comparison: 01/07/2008

CLINICAL DATA: Tube / catheter placement.  PICC placement.

PORTABLE CHEST - 1 VIEW

[view not recorded]
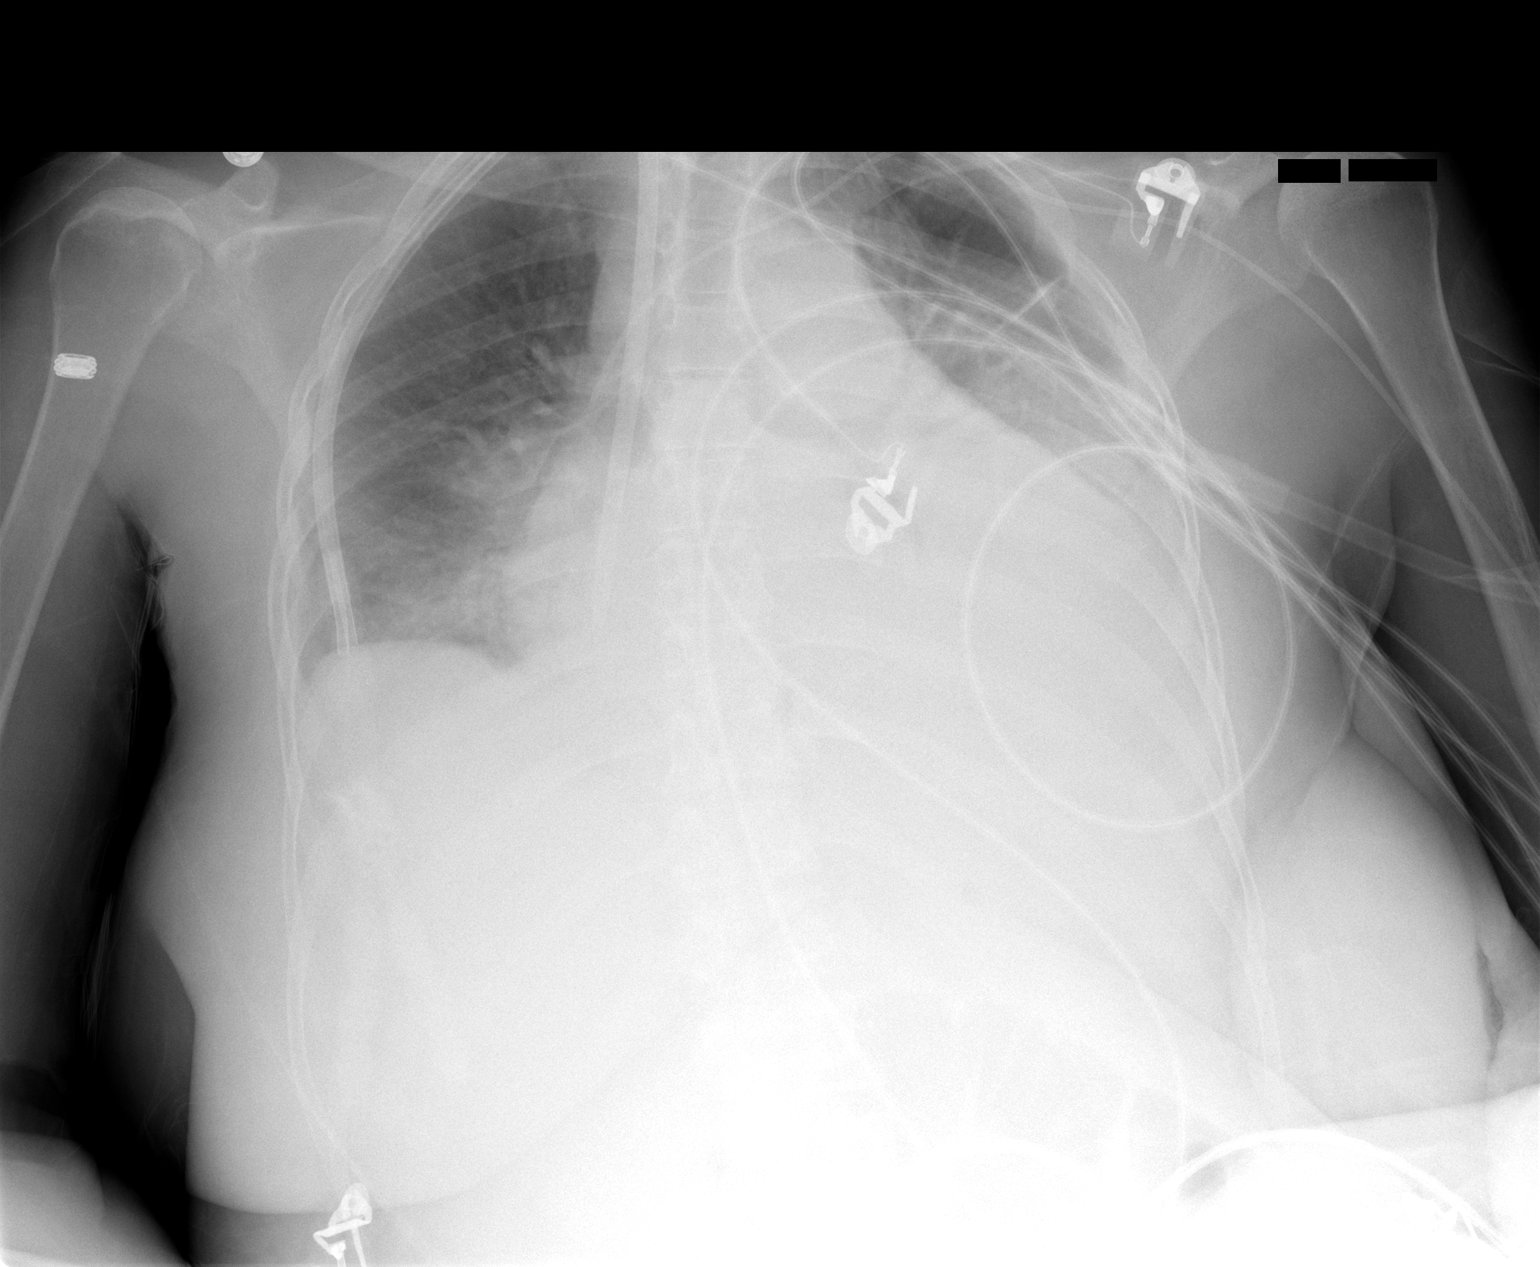

[1 of 1 positions shown; findings below may reference images not displayed]

FINDINGS: Trachea is midline.  Heart is enlarged, stable.  Right IJ
catheter tip projects over the right atrium.  Left PICC tip
projects over the SVC.

Lungs are low in volume with interstitial prominence and
indistinctness.  There is increasing left lower lobe consolidation.
Left pleural effusion.
IMPRESSION: 1.  Congestive heart failure.
2.  Worsening left lower lobe air space consolidation.  Pneumonia
is considered.

## 2010-06-23 LAB — CBC
MCHC: 33 g/dL (ref 30.0–36.0)
MCV: 90 fL (ref 78.0–100.0)
RDW: 16.1 % — ABNORMAL HIGH (ref 11.5–15.5)

## 2010-06-23 LAB — COMPREHENSIVE METABOLIC PANEL
ALT: 129 U/L — ABNORMAL HIGH (ref 0–35)
AST: 93 U/L — ABNORMAL HIGH (ref 0–37)
Calcium: 9 mg/dL (ref 8.4–10.5)
Creatinine, Ser: 2.67 mg/dL — ABNORMAL HIGH (ref 0.4–1.2)
GFR calc Af Amer: 25 mL/min — ABNORMAL LOW (ref >60)
GFR calc non Af Amer: 21 mL/min — ABNORMAL LOW (ref >60)
Glucose, Bld: 74 mg/dL (ref 70–99)
Sodium: 138 mEq/L (ref 135–145)
Total Protein: 7.1 g/dL (ref 6.0–8.3)

## 2010-06-23 LAB — PROTIME-INR: Prothrombin Time: 15 seconds (ref 11.6–15.2)

## 2010-06-23 LAB — APTT: aPTT: 42 seconds — ABNORMAL HIGH (ref 24–37)

## 2010-07-30 NOTE — Group Therapy Note (Signed)
[name], [name]             ACCOUNT NO.:  [id number]   MEDICAL RECORD NO.:  [id number]          PATIENT TYPE:  INP   LOCATION:  IC06                          FACILITY:  [facility]   PHYSICIAN:  Edward L. Luan Pulling, M.D.DATE OF BIRTH:  [birth year]   DATE OF PROCEDURE:  01/05/2008  DATE OF DISCHARGE:                                 PROGRESS NOTE   Ms. [name] was able to be successfully extubated yesterday, and she has  done well so far.  No new problems have been noted overnight.  She is  awake, responsive, although she is still somewhat slow.  Her pulse is  113.  Her blood pressure is 158/84.  Her chest is much clearer than  before.  Her heart is regular.  Her abdomen is soft.  Her phosphorus is  3.6.  Her white count is 17,000, hemoglobin is 9.5, platelets 203.  BMET, BUN is 31, creatinine is 2.16.  Respiratory culture, no organism  seen.   ASSESSMENT:  She is, I think, generally better.  She has had some  problems with volume overload related to her renal failure.  She, of  course, has renal failure based on her systemic lupus erythematosus.  She has had to be intubated and ventilated twice now in the last month  or so.  She has improved and plans are to continue with her treatments,  medications, and followup.      Edward L. Luan Pulling, M.D.  Electronically Signed     ELH/MEDQ  D:  01/05/2008  T:  01/05/2008  Job:  [id number]

## 2010-07-30 NOTE — Consult Note (Signed)
[name], [name]             ACCOUNT NO.:  [id number]   MEDICAL RECORD NO.:  [id number]          PATIENT TYPE:  AMB   LOCATION:  SDS                          FACILITY:  [facility]   PHYSICIAN:  Edward L. Luan Pulling, M.D.DATE OF BIRTH:  [birth year]   DATE OF CONSULTATION:  DATE OF DISCHARGE:  12/21/2007                                 CONSULTATION   REFERRING PHYSICIAN:  Tesfaye D. Legrand Rams, MD   REASON FOR CONSULTATION:  Respiratory failure.   SUBJECTIVE:  I saw [name] briefly last night when she had come back  from having a dialysis catheter placed and then had seizures.  [facility] was called, I was on the floor, so I asked them take her to the  intensive care unit for some labs, etc., and talked to Dr. Legrand Rams, but  then once she got to the ICU, she was essentially apneic and had to be  intubated and placed on mechanical ventilation.  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  When she was admitted to the hospital, she had a hemoglobin  of 6.9, hematocrit of 22.9, and worsened renal function.  She was  admitted on the first.   Her medications at the nursing home are metoprolol 100 mg b.i.d., Megace  400 mg b.i.d., Prilosec 20 mg daily, niacin 500 mg daily, Zoloft 150 mg  daily, ergocalciferol 50,000 units subcu weekly, MiraLax 17 g daily,  Norvasc 10 mg daily, Neurontin 100 mg daily, Plaquenil 200 mg daily,  multiple vitamin daily, Prostat 30 mL b.i.d., calcium carbonate 500 mg  t.i.d., Remeron which is 50 mg at bedtime, I am not sure if that is the  correct dose, Zaroxolyn 5 mg daily,  Demadex 60 mg b.i.d., Ensure 240 mL  b.i.d., oxycodone as needed for pain, and Anusol-HC ointment.   SOCIAL HISTORY:  She lives at [place].  She has been in the hospital  recently.  She does not use alcohol, tobacco, or illicit drugs.   PHYSICAL EXAMINATION:  GENERAL:  Now shows that she is sedated,  intubated.  She is on mechanical ventilation.  VITAL SIGNS:  Her heart rate is in the 90s, blood pressure 116/78,  respirations are 16, O2 sats 100% on the ventilator.  She is not very  responsive.  CHEST:  Shows rhonchi bilaterally.  HEART:  Regular without a gallop.  ABDOMEN:  Soft.  She has anasarca with diffuse swelling essentially  every where.   Her lab work this morning, blood gas about 5:15 show on a rate of 12, 5  of PEEP, tidal volume of 550% shows pH 7.35, pCO2 of 26, pO2 of 218.  White count is 19,300, hemoglobin is 11.4, platelets 166, and BMET shows  a BUN of 97, creatinine 4.33.  Chest x-ray post intubation shows  endotracheal tubes in good position.  The PICC line and dual-lumen  central venous catheter in good position, some atelectasis in the left  base.  CT head, no acute intracranial abnormality.   ASSESSMENT AND PLAN:  She has respiratory failure.  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  Plan is for her to have dialysis today and that should make it easier  for her to come off of the ventilator.  She is not going to be able to  be extubated today, I am certain, but if she can have dialysis and have  some fluid taken off, it will make things easier.      Edward L. Luan Pulling, M.D.  Electronically Signed     ELH/MEDQ  D:  12/22/2007  T:  12/22/2007  Job:  [id number]

## 2010-07-30 NOTE — Group Therapy Note (Signed)
NAMESHAWNDRIKA, BRINSON             ACCOUNT NO.:  [id number]   MEDICAL RECORD NO.:  [id number]          PATIENT TYPE:  INP   LOCATION:  A319                          FACILITY:  [facility]   PHYSICIAN:  Edward L. Luan Pulling, M.D.DATE OF BIRTH:  12-29-1975   DATE OF PROCEDURE:  DATE OF DISCHARGE:                                 PROGRESS NOTE   Patient of Dr. Josephine Cables.   Ms. Desanctis is overall I think about the same.  She still says she is  uncomfortable.   Her exam shows her temperature is 99.9, pulse 120, respirations 20,  blood pressure 125/93, O2 sats 96%.  Her chest shows some rhonchi, not a  great deal.  Her heart is regular.  Her abdomen is soft.   ASSESSMENT:  I think things are about the same.   PLAN:  Continue with the treatments, dialysis, etc., as needed.      Edward L. Luan Pulling, M.D.  Electronically Signed     ELH/MEDQ  D:  01/09/2008  T:  01/09/2008  Job:  [id number]

## 2010-07-30 NOTE — Procedures (Signed)
[name], [name]             ACCOUNT NO.:  [id number]   MEDICAL RECORD NO.:  [id number]          PATIENT TYPE:  INP   LOCATION:  IC03                          [facility]   PHYSICIAN:  Kofi A. Merlene Laughter, M.D. DATE OF BIRTH:  [birth year]   DATE OF PROCEDURE:  DATE OF DISCHARGE:                              EEG INTERPRETATION   HISTORY:  This is a 35 year old lady who presents with multiple  seizures.  She had a history of lupus.   MEDICATIONS:  Norvasc, Protonix, Megace, RenoCal, Zoloft, niacin,  Remeron, Lopressor, Neurontin, Lasix, Ativan, Diprivan, and Dilantin.   ANALYSIS:  A 16-channel recording is conducted using standard 10/20  measurements.  They recorded for 24 minutes.  There is a beta background  activity of 12 hertz.  Higher beta activity is observed in the frontal  area.  Photic stimulation is carried out without significant changes in  the background activity.  There is no focal or lateralized slowing.  There is no epileptiform activity observed.   IMPRESSION:  This recording is unremarkable.  There is no evidence of  epileptiform activity.  A single recording, however, does not rule out  epileptic seizures.      Kofi A. Merlene Laughter, M.D.  Electronically Signed     KAD/MEDQ  D:  12/23/2007  T:  12/23/2007  Job:  [id number]

## 2010-07-30 NOTE — Group Therapy Note (Signed)
NAMEJULIA, HUISMAN             ACCOUNT NO.:  [id number]   MEDICAL RECORD NO.:  [id number]          PATIENT TYPE:  INP   LOCATION:  IC03                          [facility]ILITY:  [facility]   PHYSICIAN:  Edward L. Luan Pulling, M.D.DATE OF BIRTH:  December 07, 1975   DATE OF PROCEDURE:  DATE OF DISCHARGE:                                 PROGRESS NOTE   Ms. Korzeniewski is, I think about, the same.  She is sluggish, but  arousable.  She has had no new complaints or problems noted at this  point.  She remains in the ICU.  She did get a feeding tube placed  yesterday and she started on nutritional replacement per NG.   PHYSICAL EXAMINATION:  VITAL SIGNS:  Her blood pressure 107/68 and pulse  111.  GENERAL:  She is afebrile.  CHEST:  Decreased breath sounds, some rhonchi.  HEART:  Regular with a mild tachycardia.  ABDOMEN:  Soft.   LABORATORY STUDIES:  Blood culture from the February 20, 2008, is  negative.  Her CBC this morning shows white count 19,800, which is about  the same.  Hemoglobin is 8.6.  She very well requires some blood.  Platelets 607.  BMET shows a potassium of 3.4, BUN at 12, and creatinine  2.39.   ASSESSMENT:  She has systemic lupus erythematosus with multiple involved  organ systems.  She has a chronic renal failure on dialysis.  She is  mildly hypokalemic.  She has very poor nutrition with an albumin level  yesterday at 1.  She is anemic and I will hold on blood transfusion  unless she gets below 8, but tomorrow we will go ahead and have her take  blood and go ahead and type and cross her as well.  We will plan to  continue with her antibiotics.  We do not have a source of her fever,  etc.   PLAN:  To continue with meds and follow.      Edward L. Luan Pulling, M.D.  Electronically Signed     ELH/MEDQ  D:  02/13/2008  T:  02/13/2008  Job:  [id number]

## 2010-07-30 NOTE — Discharge Summary (Signed)
NAMESHANNELL, MARICLE             ACCOUNT NO.:  [id number]   MEDICAL RECORD NO.:  [id number]          PATIENT TYPE:  INP   LOCATION:  A310                          FACILITY:  [facility]   PHYSICIAN:  Tesfaye D. Legrand Rams, MD   DATE OF BIRTH:  [birth year]   DATE OF ADMISSION:  12/31/2007  DATE OF DISCHARGE:  11/05/2009LH                               DISCHARGE SUMMARY   DISCHARGE DIAGNOSES:  1. Status post ventilatory dependent respiratory failure.  2. Pneumonia.  3. Sepsis.  4. Urinary tract infection.  5. Gastrointestinal bleed.  6. Anemia secondary to the above.  7. Chronic renal failure, on hemodialysis.  8. Seizure disorder.  9. Systemic lupus erythematosus.  10.Protein calorie malnutrition.  11.Anxiety/depression disorder.  12.Multiple electrolyte abnormalities.  13.Tachyarrhythmia.  14.Pericardial effusion.   DISCHARGE MEDICATIONS:  1. DuoNeb q.4 h. p.r.n.  2. Calcium acetate 667 mg 1 tablet t.i.d.  3. Plaquenil 200 mg daily.  4. Megace 400 mg b.i.d.  5. Remeron 15 mg q.h.s.  6. Multivitamin 1 tablet daily.  7. Renagel 800 mg a.c. and q.h.s.  8. Zoloft 175 mg daily.  9. Dilantin 200 mg b.i.d.  10.Neurontin 100 mg daily.  11.Lopressor 150 mg b.i.d.  12.Cardizem 240 mg p.o. daily.  13.Protonix 40 mg p.o. b.i.d.  14.Dulcolax suppository 10 mg per rectum p.r.n.  15.MiraLax 17 grams p.o. daily as needed.  16.Tylenol 650 q.4 h p.r.n.  17.Lortab 5/500 1 tablet q.6 h p.r.n.   DISPOSITION:  The patient will be discharged to [facility] home.   HOSPITAL COURSE:  This is a 35 year old female patient with a  complicated medical illness who was admitted to [facility] on  December 31, 2007 due to fever and chills.  The patient was in and out of  the hospital due to multiple problems.  She was recently started on  hemodialysis.  On admission the patient was found to have a left lower  lobe pneumonia.  She was started on IV antibiotics and was admitted to  the floor.   While the patient was on the floor she became severely  hypoxic and she was moved to the ICU and was intubated.  The patient was  managed on ventilatory support and later she was able to be weaned.  She  was continued on IV antibiotics and hemodialysis.  The patient had a  prolonged hospital stay.  She also suddenly developed a massive GI  bleed, for which she was moved back to the ICU and required about 11  units of blood transfusion.  GI consult was done and the patient was  evaluated and over the hospital stay her bleeding stopped and the  patient gradually improved.  The patient is currently able to tolerate  her hemodialysis in a dialysis chair.  She is off antibiotics and she is  gradually improving.  The patient will be discharged back to the nursing  home to continue her medication and outpatient dialysis.      Tesfaye D. Legrand Rams, MD  Electronically Signed     TDF/MEDQ  D:  01/20/2008  T:  01/20/2008  Job:  [id number]

## 2010-07-30 NOTE — H&P (Signed)
NAMENICHOLAS, [name]             ACCOUNT NO.:  [id number]   MEDICAL RECORD NO.:  [id number]          PATIENT TYPE:  INP   LOCATION:  A317                          FACILITY:  [facility]   PHYSICIAN:  Tesfaye D. Legrand Rams, MD   DATE OF BIRTH:  [birth year]   DATE OF ADMISSION:  10/28/2007  DATE OF DISCHARGE:  LH                              HISTORY & PHYSICAL   CHIEF COMPLAINT:  1. Low hemoglobin and hematocrit.  2. Generalized edema.   HISTORY OF PRESENT ILLNESS:  This is a 35 years old female patient with  history of chronic renal failure secondary to lupus and nephropathy was  directly admitted after the patient was found to have a hemoglobin of  7.9 with hematocrit of 23.  The patient also had progressively worsening  generalized edema in spite of high-dose diuretics.  The patient was  recently discharged from [facility] after she was treated for  hypercalcemia.   REVIEW OF SYSTEMS:  No fever, chills, cough, shortness of breath, chest  pain, nausea, vomiting, abdominal pain, dysuria, urgency, or frequency  of urination.   PAST MEDICAL HISTORY:  1. Lupus nephropathy.  2. Chronic renal failure secondary to the above.  3. Hypercalcemia.  4. Depression disorder.  5. Anemia.  6. Protein calorie malnutrition.  7. Generalized deconditioning.   CURRENT MEDICATIONS:  1. Vitamin D3 50,000 units q.4-hour 4 weeks.  2. Prednisolone 20 mg daily for 2 days, then 10 mg p.o. daily for      another 2 days.  3. Aranesp 40 mcg every weekly.  4. MiraLax 17 g daily.  5. Norvasc 10 mg daily.  6. Gabapentin 100 mg daily.  7. Calcium with vitamin D 1 tablet t.i.d.  8. Multivitamin 1 daily.  9. Hydroxychloroquine 200 mg daily.  10.Megace 40 mg daily.  11.Metoprolol 100 mg b.i.d.  12.Omeprazole 20 mg daily.  13.Bicarbonate 650 mg t.i.d.  14.Niacin 500 mg at bedtime.  15.Dulcolax 10 mg p.r.n.  16.Lasix 80 mg b.i.d.  17.Zoloft 150 mg daily.   SOCIAL HISTORY:  The patient has been a resident  of nursing home.  She  is disabled due to her illness.  No history of alcohol, tobacco, or  substance abuse.   PHYSICAL EXAMINATION:  GENERAL:  The patient is alert and awake with  chronically sick-looking.  VITAL SIGNS:  Blood pressure 113/80, pulse 98, respiratory rate 16, and  temperature 98.4 degrees Fahrenheit.  HEENT:  Pupils are equal and reactive.  NECK:  Supple.  CHEST:  Poor air entry.  Bilateral rhonchi.  CARDIOVASCULAR SYSTEM:  First and second heart sound heard.  No murmur  and no gallop.  ABDOMEN:  Soft and lax.  Bowel sound is positive.  No mass or no  organomegaly.  EXTREMITIES:  The patient has a generalized anasarca.   LABS:  On admission, CBC repeat after admission, WBC is 20.7, hemoglobin  9.2, hematocrit 27.8, platelet 200,000.  BMP, sodium 136, potassium 5.1,  chloride 100, carbon dioxide 22, glucose 154, BUN 29, creatinine 2.0,  and calcium 7.0.   ASSESSMENT:  1. Chronic renal failure secondary to lupus nephropathy  with      generalized anasarca.  2. Anemia.  3. Protein calorie malnutrition.  4. Generalized deconditioning.  5. History of hypercalcemia.  6. Anxiety, depression disorder.   PLAN:  We will hold planned transfusion at this time, since repeat  hemoglobin is 9.  We will continue the patient on IV diuretics.  We will  do nephrology consult.  Will monitor CBC and BMP.      Tesfaye D. Legrand Rams, MD  Electronically Signed     TDF/MEDQ  D:  10/28/2007  T:  10/29/2007  Job:  [id number]

## 2010-07-30 NOTE — Group Therapy Note (Signed)
NAMEYITTY, DACRES             ACCOUNT NO.:  [id number]   MEDICAL RECORD NO.:  [id number]          PATIENT TYPE:  INP   LOCATION:  IC06                          FACILITY:  [facility]   PHYSICIAN:  Edward L. Luan Pulling, M.D.DATE OF BIRTH:  1975-10-10   DATE OF PROCEDURE:  DATE OF DISCHARGE:                                 PROGRESS NOTE   The patient of Dr. Josephine Cables.   Ms. Beerman was brought back in over the weekend with increasing  problems with shortness of breath.  She ended up having to be intubated  again.  She reports she has renal failure, she has systemic lupus, and  she has had to have dialysis again.  She has Gram-negative rods in her  urine.  The etiology is not known yet, but she is on antibiotics, she  had covered that I think.   Her exam, her heart rate is about 100, blood pressure 135/70,  respirations 14.  Her chest is fairly clear with some rhonchi.  Her  heart is regular.  She still has some what looks like some peripheral  edema and some third spacing of fluids but is not as bad as earlier.   Her lab work, white count is 14,500, hemoglobin is 9.8, platelets 182.  Her BUN is 33, creatinine 2.43, and blood gas this morning she is on  40%, 500, rate 14, 5 of PEEP shows pO2 112, pCO2 of 34, pH 7.41.  Her  hemoglobin level is after receiving blood last night.  She did have a  heparin antibody screen as well, which was negative because she dropped  her platelet count.   ASSESSMENT:  She is going to continue with her treatments of medication,  and I think she is going to have dialysis today and then plan for being  if we can wean her later after she has had the dialysis.      Edward L. Luan Pulling, M.D.  Electronically Signed     ELH/MEDQ  D:  01/03/2008  T:  01/03/2008  Job:  [id number]

## 2010-07-30 NOTE — Discharge Summary (Signed)
NAME:  [name], [name]             ACCOUNT NO.:  [id number]   MEDICAL RECORD NO.:  [id number]          PATIENT TYPE:  INP   LOCATION:  A340                          FACILITY:  [facility]   PHYSICIAN:  Tesfaye D. Legrand Rams, MD   DATE OF BIRTH:  1976-02-15   DATE OF ADMISSION:  02/01/2008  DATE OF DISCHARGE:  LH                               DISCHARGE SUMMARY   DISCHARGE DIAGNOSES:  1. Pneumonia with pleural effusion.  2. Status post thoracentesis.  3. History of recurrent sepsis.  4. Chronic renal failure on hemodialysis.  5. Systemic lupus erythematosus.  6. Seizure disorder.  7. Protein calorie malnutrition.  8. Anxiety depression disorder.  9. History of multiple electrolyte abnormalities.  10.History of urinary tract infection.   DISCHARGE MEDICATIONS:  1. Renagel 800 mg with meals.  2. Zoloft 175 mg daily.  3. Neurontin 100 mg daily.  4. Lopressor 100 mg b.i.d.  5. Megace 400 mg b.i.d.  6. Plaquenil 200 mg daily.  7. Remeron 15 mg at bedtime.  8. Dilantin 200 mg b.i.d.  9. Cardizem CD 240 mg daily.  10.Rena-Vite 1 tablet daily.  11.Protonix 40 mg daily.  12.Rocephin 1 g IV piggyback q.24 h. for 5 more days.  13.MiraLax 17 g daily as needed.  14.Bisacodyl suppository 10 mg suppository daily as needed.  15.DuoNeb q.4 h. as needed.  16.Acetaminophen 500 mg q.6 h. as needed.  17.Xanax 0.25 mg q.6 h. as needed.   DISPOSITION:  The patient will be discharged back to [place]  to continue her treatment.   HOSPITAL COURSE:  This is a 35 year old female patient with complicated  medical history who was in and out of hospital, was readmitted on  February 01, 2008, due to chest pain and left-sided shortness of breath.  Her chest x-ray showed large pleural effusion, which was filling the  left side of the chest with consolidation and possible infiltrates.  The  patient was admitted as a case of pneumonia with pleural effusion.  She  was started on IV antibiotics and had  thoracentesis about 3 times.  Almost 3 L of fluid was aspirated.  Her lung re-expanded and the patient  started feeling better.  She was continued on hemodialysis and her  regular medications.  The patient is currently improved and she will be  discharged back to [place] to continue her medication  including IV Rocephin and to continue outpatient hemodialysis.      Tesfaye D. Legrand Rams, MD  Electronically Signed     TDF/MEDQ  D:  02/09/2008  T:  02/09/2008  Job:  [id number]

## 2010-07-30 NOTE — Group Therapy Note (Signed)
[name], [name]             ACCOUNT NO.:  [id number]   MEDICAL RECORD NO.:  [id number]          PATIENT TYPE:  INP   LOCATION:  IC06                          FACILITY:  [facility]   PHYSICIAN:  Edward L. Luan Pulling, M.D.DATE OF BIRTH:  08/11/1975   DATE OF PROCEDURE:  DATE OF DISCHARGE:                                 PROGRESS NOTE   A patient of Dr. Josephine Cables.   [name] is admitted with renal failure, what appears to be volume  overload.  She had what probably is a pneumonia.  She now has greater  than 100,000 colonies Gram-negative rods in her urine as well.  She  remains intubated and on the ventilator sedated.  She is going to be  dialyzed tomorrow, I believe, and I think we will try to see what we can  do with weaning after dialysis.  Otherwise, she is about the same.   PHYSICAL EXAMINATION:  VITAL SIGNS:  Her heart rate is about 116, looks  like sinus rhythm; blood pressure 114/77, respirations are 14.  HEENT:  Her pupils are reactive.  CHEST:  Shows rhonchi.  EXTREMITIES:  She still has some edema.  CENTRAL NERVOUS SYSTEM:  She is sedated, but she has no focal  neurological findings.   Chest x-ray actually looks a bit better, although there is significant  difference in technique.   On laboratory work, urine as mentioned is growing greater than 100,000  colonies.  The catheter tip culture so far is negative.  White blood  count 15,000, hemoglobin dropped to 7.4 from 9 yesterday.   We have not got a stool for blood yet, so I am going to order stools for  blood and go ahead and transfuse her 2 units of packed red blood cells.  Hold off on the weaning for now.   She has been getting Accu-Cheks done.  We will go ahead and put her on a  sensitive sliding-scale insulin.  She is receiving tube feedings etc.  She is currently on Avelox, Zosyn, and vancomycin, so I do not think we  need to add an antibiotic, and we will continue with all the other  treatments.  Hopefully, we  will be able to wean her tomorrow.      Edward L. Luan Pulling, M.D.  Electronically Signed     ELH/MEDQ  D:  01/02/2008  T:  01/02/2008  Job:  [id number]

## 2010-07-30 NOTE — Consult Note (Signed)
NAMECLIFFORD, [name]             ACCOUNT NO.:  [id number]   MEDICAL RECORD NO.:  [id number]          PATIENT TYPE:  INP   LOCATION:  A340                          FACILITY:  [facility]   PHYSICIAN:  Alison Murray, M.D.DATE OF BIRTH:  [birth year]   DATE OF CONSULTATION:  02/02/2008  DATE OF DISCHARGE:                                 CONSULTATION   REASON FOR CONSULT:  End-stage renal disease.   HISTORY OF PRESENT ILLNESS:  [name] is a 35 year old African  American with past medical history of lupus, history of seizure, history  of recurrent respiratory failure, status post intubation presently, came  yesterday from Dialysis Unit after the patient continued to complain of  chest pain and difficulty breathing.  [name] most of the time is  restless, and at the end of dialysis usually she complains of shortness  of breath, and because of her depression, she always cries.  However,  yesterday, she was stating she was having chest pain; because of that,  she was sent to the emergency room, where she was found she had pleural  effusion and also leukocytosis, admitted for that.  Presently, the  patient feels better, but still she complains of occasional shortness of  breath.  The patient most of the time is noncommunicative.  At this  moment, she denies any nausea or vomiting.   PAST MEDICAL HISTORY:  As stated above, a patient with:  1. History of lupus.  2  History of seizure disorder.  1. History of pericardial effusion.  2. History of tachyarrhythmia.  3. History of sepsis.  4. History of respiratory failure, she was on ventilator.  5. History of hypertension.  6. History of anemia.  7. History of, also, hypercholesterolemia.  8. History of deconditioning.   MEDICATIONS:  At this moment, consist of:  1. Zithromax 500 mg IV q.24 h.  2. Rocephin 1 g IV q.24 h.  3. Neurontin 100 mg p.o. daily.  4. Hydroxychloroquine 200 mg p.o. daily.  5. Megace 40 mg p.o. b.i.d.  6.  Lopressor 100 mg p.o. b.i.d.  7. Remeron 15 mg p.o. nightly.  8. Also, she is on Protonix 40 mg p.o. daily.  9. Dilantin 200 mg p.o. b.i.d.  10.Nephro-Vite 1 tablet p.o. daily.  11.Zoloft 175 mg p.o. daily.  12.She was on Renagel 800 mg 1 tablet p.o. t.i.d.  13.She is also on Ventolin inhaler.   ALLERGIES:  No known allergies.   SOCIAL HISTORY:  The patient is resident of [place].   REVIEW OF SYSTEMS:  Very difficult to get additional history, but she  shakes her head in response.   PHYSICAL EXAMINATION:  GENERAL:  On examination, the patient is  somnolent but arousable.  VITAL SIGNS:  Her blood pressure is 135/85, heart rate of 110, and O2  saturation is 95.  CHEST:  Decreased breath sounds.  At this moment, she does not have any  wheezing.  HEART:  Reveals regular rate and rhythm.  Seems to be somewhat  tachycardic.  No murmur.  ABDOMEN:  Soft.  Positive bowel sounds.  EXTREMITIES:  She has  trace edema.   LABORATORY DATA:  Her white blood cell count is 24.2, hemoglobin 10.8,  and hematocrit 32.5.  She has an INR of 1.7, PT of 17.8, and PTT of 53.  Sodium 141, potassium 4.1, BUN is 23, creatinine is 2.36, and albumin is  1.4.  Her chest x-ray shows she has a new left pleural effusion, which  seems to be very significant.  She also has some cardiomegaly, which she  has from before.  The pericardial effusion which was mentioned before at  this moment is noted.   ASSESSMENT:  1. End stage.  She is status post hemodialysis yesterday.  BUN and      creatinine within acceptable.  Normal potassium.  She does not have      any sign of fluid overload.  2. Pleural effusion.  It seems to be significant, for this moment      etiology not clear.  It is mainly on the left.  Since she has a      history of lupus, at this moment, the pericardial effusion and      probably also the pleural effusion could be associated with that;      however, also because of elevated white  blood cell count, pleural      effusion related to pneumonia to be considered.  3. History of lupus.  4. History of cardiomegaly with pericardial effusion.  5. History of seizure disorder.  She is on Dilantin.  No recent      seizure activity.  6. History of depression, which is a very severe problem.  7. History of protein-calorie malnutrition.  8. History of tachycardia.  Most of the time, it seems to be possibly      related to her anxiety disorder.  Most of the time it happens at      the end of dialysis when the patient becomes more restless and when      she starts complaining of, usually, shortness of breath.  Her heart      rate seems to have come down as she came to the hospital.   RECOMMENDATIONS:  We will make arrangement for her to get dialysis  tomorrow, which is her regular day.  We will continue this present  management and, hopefully, with thoracentesis the patient may feel  better.  However, because of her severe depression and also possible  anxiety disorder, still dialysis may be a little bit difficult.  Since  she came to the Dialysis Unit literally every day, the patient wanted to  come out of dialysis, and she becomes restless, crying.      Alison Murray, M.D.  Electronically Signed    BB/MEDQ  D:  02/02/2008  T:  02/02/2008  Job:  [id number]

## 2010-07-30 NOTE — Group Therapy Note (Signed)
[name], [name]             ACCOUNT NO.:  [id number]   MEDICAL RECORD NO.:  [id number]         PATIENT TYPE:  PINP   LOCATION:  IC03                          FACILITY:  [facility]   PHYSICIAN:  Edward L. Luan Pulling, M.D.DATE OF BIRTH:  18-Apr-1975   DATE OF PROCEDURE:  DATE OF DISCHARGE:                                 PROGRESS NOTE   [name] was successfully able to be weaned and extubated yesterday.  She is awake.  She is a little sluggish, but she is awake and seems to  be much improved in general.   Her exam shows her heart rate of 104, blood pressure 140/85, O2 sat is  97%.  Her chest is much clear.  She has still got significant edema  diffusely, but not as bad as it was.   Her white blood count is 9200, hemoglobin is 9.1, platelets 188.  BMET  shows a BUN is 45, creatinine 2.88, glucose of 225.   ASSESSMENT:  Overall, I think she is improved.   PLAN:  Continue with treatments and follow.      Edward L. Luan Pulling, M.D.  Electronically Signed     ELH/MEDQ  D:  12/25/2007  T:  12/25/2007  Job:  [id number]

## 2010-07-30 NOTE — Group Therapy Note (Signed)
NAMEPATIANCE, SOBCZYK             ACCOUNT NO.:  [id number]   MEDICAL RECORD NO.:  [id number]         PATIENT TYPE:  PINP   LOCATION:  IC03                          FACILITY:  [facility]   PHYSICIAN:  Edward L. Luan Pulling, M.D.DATE OF BIRTH:  04-30-1975   DATE OF PROCEDURE:  DATE OF DISCHARGE:                                 PROGRESS NOTE   This patient of Dr. Legrand Rams is in the ICU.  Ms. Stufflebean remains intubated  and on the ventilator.  She was dialyzed yesterday and got also a good  bit of fluid again.  Otherwise, she seems to be doing better.  She had a  lot of secretions yesterday which was something of a concern and had a  respiratory culture done which shows Gram-positive cocci in chains and  pairs plus Gram-negative rods.  She remains on vancomycin and Zosyn  which I think is totally appropriate.   Exam shows that she is awake and arousable.  Her blood pressure in the  120s, her pulse in the 70s now.  She is afebrile.  Her chest is must  clear.  Her heart is regular.  Her abdomen is soft.   ASSESSMENT:  She is doing well.   PLAN:  Continue with her treatments, medications, and followup.  Hopefully, we can get her off the ventilator later today.      Edward L. Luan Pulling, M.D.  Electronically Signed     ELH/MEDQ  D:  12/24/2007  T:  12/24/2007  Job:  [id number]

## 2010-07-30 NOTE — H&P (Signed)
NAME:  [name], [name]             ACCOUNT NO.:  [id number]   MEDICAL RECORD NO.:  [id number]          PATIENT TYPE:  INP   LOCATION:  A340                          FACILITY:  [facility]   PHYSICIAN:  Tesfaye D. Legrand Rams, MD   DATE OF BIRTH:  06-Mar-1976   DATE OF ADMISSION:  02/01/2008  DATE OF DISCHARGE:  LH                              HISTORY & PHYSICAL   CHIEF COMPLAINT:  Chest pain and shortness of breath.   HISTORY OF PRESENT ILLNESS:  This is a 35 year old female patient with  complicated medical history, who was in and out of hospital, was brought  to the emergency room from dialysis unit.  The patient was recently  started on hemodialysis.  She was discharged from this hospital about 2  weeks back after prolonged hospital stay due to respiratory failure and  renal failure.  She has been improved and was able to tolerate  outpatient dialysis.  However, today during dialysis, the patient  complained of shortness of breath and chest pain.  She was brought to  emergency room, where she was evaluated.  The patient was found to have  a large pleural effusion on the left side, which was almost filling the  whole hemithorax.  She had also leukocytosis.  The patient was admitted  with a possibility of pneumonia with large pleural effusion.  She was  started on IV antibiotics, and the patient is planned for thoracentesis.   REVIEW OF SYSTEMS:  The patient feels generally weak.  No headache,  fever, nausea, vomiting, abdominal pain, dysuria, urgency, or frequency  of urination.   PAST MEDICAL HISTORY:  1. Status post ventilatory-dependent respiratory failure.  2. Sepsis.  3. Urinary tract infection.  4. Chronic renal failure, on hemodialysis.  5. History of pneumonia.  6. Systemic lupus erythematosus.  7. Seizure disorder.  8. Protein-calorie malnutrition.  9. Pericardial effusion.  10.History of tachyarrhythmia.  11.History of multiple electrolyte abnormalities.   CURRENT  MEDICATIONS:  1. DuoNeb q.4 h. p.r.n.  2. Calcium carbonate 667 mg t.i.d.  3. Plaquenil 200 mg daily.  4. Megace 400 mg b.i.d.  5. Remeron 15 mg at bedtime.  6. Multivitamin 1 tablet daily.  7. Renagel 800 mg p.o. a.c. and nightly.  8. Zoloft 275 mg daily.  9. Dilantin 200 mg b.i.d.  10.Neurontin 100 mg daily.  11.Lopressor 150 mg b.i.d.  12.Cardizem 240 mg daily.  13.Protonix 40 mg b.i.d.  14.Dulcolax suppository 10 mg p.r.n.  15.MiraLax 17 g daily p.r.n.  16.Tylenol 650 p.r.n.  17.Lortab 5/500 one tablet p.o. b.i.d. p.r.n.   SOCIAL HISTORY:  The patient has been a resident of [facility].  No history of alcohol, tobacco, or substance abuse.   PHYSICAL EXAMINATION:  GENERAL:  The patient is alert, awake,  chronically sick-looking.  VITAL SIGNS:  Blood pressure 114/85, pulse 99, respiratory rate 20, and  temperature 99.1 degrees Fahrenheit.  HEENT:  Pupils are equal and reactive.  NECK:  Supple.  CHEST:  No air entry on the left side.  There are scattered rhonchi on  the right side.  CARDIOVASCULAR:  First and  second heart sounds heard.  Tachycardic.  Regular.  ABDOMEN:  Soft and lax.  Bowel sounds positive.  No masses or  organomegaly.  EXTREMITIES:  1+ leg edema.   LABORATORY DATA:  CBC; WBC 24.2, hemoglobin 10.8, hematocrit 32.5, and  platelets 400.  BNP; sodium 143, potassium 4.1, chloride 104, carbon  dioxide 26, glucose 91, BUN 23, creatinine 2.3, and calcium 9.2.   ASSESSMENT:  This is a 35 year old female patient with a complicated  medical history, who was in and out of hospital, came to emergency room  with shortness of breath and chest pain.  She has likely pleural  effusion on the left side, which has completely filled the hemothorax.  The patient has also leukocytosis.  The patient could have large pleural  effusion with possible underlying pneumonia.   PLAN:  We will do blood culture and start the patient on a combination  of IV antibiotics.  We  will do Radiology consult for therapeutic and  diagnostic thoracentesis.  We will do Nephrology consult for her  dialysis.  Continue her regular medications.      Tesfaye D. Legrand Rams, MD  Electronically Signed     TDF/MEDQ  D:  02/01/2008  T:  02/02/2008  Job:  [id number]

## 2010-07-30 NOTE — Group Therapy Note (Signed)
NAMEMARGEAN, Erica             ACCOUNT NO.:  [id number]   MEDICAL RECORD NO.:  [id number]          PATIENT TYPE:  INP   LOCATION:  IC03                          FACILITY:  [facility]   PHYSICIAN:  Edward L. Luan Pulling, M.D.DATE OF BIRTH:  1976-01-14   DATE OF PROCEDURE:  DATE OF DISCHARGE:                                 PROGRESS NOTE   [name] was admitted yesterday with possible urinary tract  infection.  She says she is a little better and she does look better.  She did require dopamine for a while yesterday, but this morning her  blood pressure is 120/89 on no dopamine, respirations 23, heart rate  115, O2 sats in the 90s and she looks significantly more comfortable.  Her chest is clearer than it was.  Her heart is regular.  Her abdomen is  soft.  Her extremities showed no edema.  CNS, she is sluggish, but  responsive.   LABORATORY WORK:  Albumin on a common metabolic profile yesterday was  1.4.  This morning white count is 21,400 down from 23,000 yesterday.  Hemoglobin is 9.3, platelets 520.  BMET, potassium is 3, so we are not  going to replace that.  Her BUN is up to 19, creatinine 3.72.  Cardiac  panel thus far negative.  Blood culture thus far negative.  Urine  culture is pending.   ASSESSMENT:  She has systemic lupus erythematosus with multiple  complications.  She has bilateral pleural effusion, the right now  greater than left.  She has renal failure consultation that has been  requested with [name] who has been providing her dialysis.  She is  hypokalemic.  She had some chest discomfort, but her cardiac enzymes  thus far negative.  She has seizure disorder.  Her Dilantin level was  okay at last visit.  She is severely malnourished with an albumin of  1.4.   PLAN:  Replace potassium, continue with her antibiotics, etc. and  follow.      Edward L. Luan Pulling, M.D.  Electronically Signed     ELH/MEDQ  D:  02/10/2008  T:  02/10/2008  Job:  [id number]

## 2010-07-30 NOTE — Group Therapy Note (Signed)
NAMERANDILYN, KRIGBAUM             ACCOUNT NO.:  [id number]   MEDICAL RECORD NO.:  [id number]          PATIENT TYPE:  INP   LOCATION:  A340                          FACILITY:  [facility]   PHYSICIAN:  Edward L. Luan Pulling, M.D.DATE OF BIRTH:  09/19/1975   DATE OF PROCEDURE:  02/07/2008  DATE OF DISCHARGE:                                 PROGRESS NOTE   HISTORY:  Ms. Mitnick overall seems to be about the same.  She has no  new complaints.  Dr. Legrand Rams has ordered another thoracentesis which I  think is appropriate.  Otherwise she is doing okay and had no new  complaints.   PHYSICAL EXAMINATION:  VITAL SIGNS:  Physical examination today shows  temperature is 98.2, pulse 95, respirations 18, blood pressure 104/84,  and O2 sats 97% on 2 liters.  CHEST:  Clear.   ASSESSMENT:  She is I think a little better.   PLAN:  To continue with her current treatments and medications and  follow.      Edward L. Luan Pulling, M.D.  Electronically Signed     ELH/MEDQ  D:  02/07/2008  T:  02/07/2008  Job:  [id number]

## 2010-07-30 NOTE — Discharge Summary (Signed)
NAME:  [name], [name]             ACCOUNT NO.:  [id number]   MEDICAL RECORD NO.:  [id number]          PATIENT TYPE:  INP   LOCATION:  A302                          FACILITY:  [facility]   PHYSICIAN:  Tesfaye D. Legrand Rams, MD   DATE OF BIRTH:  1975-12-31   DATE OF ADMISSION:  02/09/2008  DATE OF DISCHARGE:  12/03/2009LH                               DISCHARGE SUMMARY   DISCHARGE DIAGNOSES:  1. Pneumonia with pleural effusion.  2. End-stage renal failure on hemodialysis.  3. Failure to thrive.  4. Severe protein calorie malnutrition with albumin of 1.  5. Systemic lupus erythematosus.  6. Severe deconditioning.  7. Seizure disorder.  8. History of ventilatory-dependent respiratory failure.  9. History of urinary tract infection and recurrent sepsis.  10.Depression disorder.  11.Anemia of chronic disease.  12.Multiple electrolyte abnormalities.  13.History of pleural effusion and is status post multiple      thoracentesis.  14.History of gastrointestinal bleed.   DISCHARGE MEDICATIONS:  1. Renagel 800 mg with meals.  2. Plaquenil 200 mg daily.  3. Dilantin 200 mg b.i.d.  4. Zoloft 175 mg daily.  5. Cardizem CD 240 mg daily.  6. Gabapentin 100 mg daily.  7. Remeron 50 mg at bedtime.  8. Nephro-Vite 1 tablet daily.  9. Atrovent and Proventil nebulizer q.i.d. p.r.n.  10.Megace 400 mg b.i.d.  11.Lopressor 50 mg b.i.d.  12.Procrit 12,000 units during hemodialysis.  13.Xanax 0.25 mg q.i.d. p.r.n.  14.Acetaminophen 500 mg q.6 h. p.r.n.   DISPOSITION:  The patient will be transferred back to [facility].   HOSPITAL COURSE:  This 35 year old female patient with complicated  medical history was readmitted on February 09, 2008, after she was  discharged to nursing home on the same day.  The patient has been in and  out of hospital for the last 3 months.  She was admitted with shortness  of breath and fever.  The patient was found to have pneumonia with a  large pleural effusion.   She had a thoracentesis and was continued on IV  antibiotics for over 2 weeks.  The patient was discharged to continue  her treatment and hemodialysis in the nursing home.  However, the  patient was sent back in less than half an hour to the emergency room  because the patient continued to complain chest pain.  She was  readmitted and continued with IV antibiotics.  The patient remained very  fragile, malnourished, and severely deconditioned.  NG tube feeding was  initiated.  However, the patient continued to pull out NG tube.  A GI  consult was done also for PEG tube placement.  The patient refused to  have PEG placement.  I have tried to place the patient in long-term  hospital where she can be rehabilitated and nutritionally supported and  to continue her hemodialysis.  However, her insurance has declined and  did not approve her transfer to long-term hospital.  They have requested  to send her back to nursing home.  At this time, the patient is severely  deconditioned and severely malnourished and she remained critically ill.  The patient is on hemodialysis where she will be discharged back to  nursing home and will continue her regular medications.  We will also  continue nutritional support including protein supplements.  The patient  will continue outpatient hemodialysis.  Overall, the patient is very  weak and she requires total care at this time.      Tesfaye D. Legrand Rams, MD  Electronically Signed     TDF/MEDQ  D:  02/17/2008  T:  02/17/2008  Job:  [id number]

## 2010-07-30 NOTE — Group Therapy Note (Signed)
[name], [name]             ACCOUNT NO.:  [id number]   MEDICAL RECORD NO.:  [id number]          PATIENT TYPE:  INP   LOCATION:  IC03                          FACILITY:  [facility]   PHYSICIAN:  Edward L. Luan Pulling, M.D.DATE OF BIRTH:  March 09, 1976   DATE OF PROCEDURE:  DATE OF DISCHARGE:                                 PROGRESS NOTE   [name] is overall I think about the same.  She did have dialysis  yesterday, but she still got significant swelling.  She is, otherwise,  doing about the same.  She has no other new complaints.   Exam shows she is intubated, ventilated, and sedated.  Her blood  pressure is running in the 110-120 range and pulse about 90.  Her chest  is fairly clear with decreased breath sounds.  She has significant edema  still.   Her Dilantin level is 9.  BMET, BUN 70, creatinine 3.61 still elevated,  and glucose is 213.  CBC shows white count 10,600, hemoglobin is 9.9,  platelets 139, and blood gas on 40% at __________, 5 of PEEP, shows pH  of 7.4, PCO2 of 28, and PO2 of 140.  She has a concentrated metabolic  acidemia from a renal failure.   ASSESSMENT:  She has got respiratory failure, based upon multiple  factors including renal failure and __________.  We will treat her and  see if we can wean her today.  I do not think she will be able to come  off but we will see what she can do.      Edward L. Luan Pulling, M.D.  Electronically Signed     ELH/MEDQ  D:  12/23/2007  T:  12/23/2007  Job:  [id number]

## 2010-07-30 NOTE — Consult Note (Signed)
NAMEDEMRI, BRUGH             ACCOUNT NO.:  [id number]   MEDICAL RECORD NO.:  [id number]          PATIENT TYPE:  INP   LOCATION:  A317                          FACILITY:  [facility]   PHYSICIAN:  Alison Murray, M.D.DATE OF BIRTH:  Oct 27, 1975   DATE OF CONSULTATION:  10/29/2007  DATE OF DISCHARGE:                                 CONSULTATION   Ms. Caraker is 35 years old with chronic renal failure and history of  lupus nephritis, history of hypercalcemia, and presently admitted to the  hospital because of progressive worsening of edema.  Ms. Garguilo at this  moment __________ she has been seen at the [facility] and also at [facility].  Her biggest problem is worsening of her __________.  The patient  denies any shortness of breath, dizziness, or lightheadedness.   PAST MEDICAL HISTORY:  She has history of lupus nephritis, history of  chronic renal failure probably related to that, history of  hypercalcemia, history of depression, history of malnutrition, history  of GERD, history of hypertension, and history of generalized  deconditioning.  The patient spent most of the time lying in bed.   Her medications consist of:  1. Norvasc 10 mg p.o. daily.  2. Calcium carbonate 500 mg p.o. t.i.d.  3. Lasix 40 mg IV q.8 h.  4. Neurontin 100 mg p.o. daily.  5. Hydroxychloroquine 200 mg p.o. daily.  6. Megace 40 mg p.o. b.i.d.  7. Lopressor 400 mg p.o. daily.  8. Niacin 500 mg p.o. daily.  9. Protonix 40 mg p.o. daily.  10.She is also getting prednisone 40 mg p.o. daily.  11.Zoloft 150 mg p.o. daily.  12.She is also on a protein supplement.  13.She has received Kayexalate 1 dose.  14.She is getting __________ on a p.r.n. basis.   ALLERGIES:  No known allergies.   SOCIAL HISTORY:  No history of smoking, no history of alcohol abuse.  The patient seems to be a resident of [facility].   REVIEW OF SYSTEMS:  The patient at this moment denies any nausea or  vomiting.  She denies  also shortness of breath at this moment, but she  does not know much when she was in the hospital.  She states her  appetite is good.  She does not have any nausea or vomiting.  She has  swelling for __________.   PHYSICAL EXAMINATION:  GENERAL:  The patient is lying in bed.  She does  not seem to be in any apparent distress and looked very quiet and  depressed.  VITAL SIGNS:  __________, temperature is 97.5, pulse 88, blood pressure  is 147/100.  HEENT:  As stated above, the patient __________ and moist oral mucosa.  CHEST:  Decreased breath sounds Seems to be clear __________.  No  rhonchi.  No egophony.  HEART:  Regular rate and rhythm.  No murmur.  No S3.  ABDOMEN:  Soft.  Positive bowel sounds.  EXTREMITIES:  She has about 3+ edema.   Her urine output is 970 grams.  Her white blood cell count is 17.6,  hemoglobin 8.4, hematocrit 10.2.  Her sodium  is 107, potassium is 5.9,  BUN 61, and creatinine 2.6.  Her baseline creatinine seems to be around  2 when she was in in June at [facility].  Her calcium is 7.5.  Her ferritin is 900, iron saturation is 23, low albumin level.   ASSESSMENT:  1. Renal failure, possibly chronic, related to her lupus.  At this      moment, she does not have any uremic signs and symptoms.  BUN and      creatinine seems be stable.  2. Anasarca.  At this moment, the etiology is not clear.  Since she      had albumin last time, which was 1.3 in February 2009, and it was      1.4 when it was checked in April 23, 2007, probably she might      have nephrotic syndrome and probably that might account her      anasarca.  At this moment, since I do not have 24-hour urine, this      simply could be also because of malnutrition.  3. History of lupus.  She is on hydroxychloroquine and steroid.  No      signs of lupus flareup.  4. History of hypertension.  She is on Norvasc and also metoprolol.      Blood pressure seems to be controlled very well.  5.  Hyperkalemia.  She was given a dose of Kayexalate, probably related      to her renal failure.  6. History of depression.  7. History of metabolic acidosis.  She was on sodium bicarbonate as an      outpatient.  8. History of anemia, probably multifactorial including anemia of      chronic disease, and probably also iron-deficiency anemia.   RECOMMENDATIONS:  We will increase her Lasix to 100 mg IV b.i.d.  We  will add also metolazone 2.5 mg p.o. daily.  We will check her BMET,  albumin in the morning, and also we will check her phosphorus.  Since  the patient has previous workup, at this moment, we do not need to do a  workup.  The patient may require probably Epogen as an outpatient.  We  will continue his other treatment, and we will follow the patient.      Alison Murray, M.D.  Electronically Signed     BB/MEDQ  D:  10/29/2007  T:  10/30/2007  Job:  [id number]

## 2010-07-30 NOTE — Group Therapy Note (Signed)
[name], [name]             ACCOUNT NO.:  [id number]   MEDICAL RECORD NO.:  [id number]          PATIENT TYPE:  INP   LOCATION:  A303                          FACILITY:  [facility]   PHYSICIAN:  Kofi A. Merlene Laughter, M.D. DATE OF BIRTH:  Aug 18, 1975   DATE OF PROCEDURE:  DATE OF DISCHARGE:                                 PROGRESS NOTE   The patient has been extubated and placed in a floor.  She is awake and  alert and converses fairly well.   PHYSICAL EXAMINATION:  VITAL SIGNS:  Temperature 98.9, respirations 20,  pulse 116, and blood pressure 145/98.  HEENT:  Extraocular movements are full.  EXTREMITIES:  She does have poor strength in the upper extremities.  She  has at least anti-gravity in the legs.  She continued to have  significant edema, although improved.   LABORATORY DATA:  Dilantin level 9.   ASSESSMENT AND PLAN:  1. Resolved status epilepticus.  She has done well on Dilantin      therapy.  We will continue with current medication for seizure      control.  2. Systemic lupus erythematosus on Plaquenil and weaning dose of      prednisone.      Kofi A. Merlene Laughter, M.D.  Electronically Signed     KAD/MEDQ  D:  12/27/2007  T:  12/27/2007  Job:  [id number]

## 2010-07-30 NOTE — Group Therapy Note (Signed)
[name], [name]             ACCOUNT NO.:  [id number]   MEDICAL RECORD NO.:  [id number]          PATIENT TYPE:  INP   LOCATION:  IC03                          FACILITY:  [facility]   PHYSICIAN:  Edward L. Luan Pulling, M.D.DATE OF BIRTH:  1975-12-23   DATE OF PROCEDURE:  DATE OF DISCHARGE:                                 PROGRESS NOTE   Ms. [name] is coming with possible sepsis.  She had chest pain.  She  may have had urinary tract infection.  She is improved markedly.  She  has systemic lupus erythematosus and she has end-stage renal disease.  She has poor nutritional status as well.   Her exam; blood pressure 129/86, pulse 98, temperature is 99.7 or 37.6.  Her white blood count has come down from 23,000-20,500 this morning.  INR yesterday +3236, so far today +590, weight 51.3 kg, 48.6 yesterday.  Potassium is 3.   ASSESSMENT:  She is better.  Currently, her blood pressure has come up.  She looks better.  She seems to be improving.  She is set I think for  dialysis.  She is set for potential thoracentesis on the right lung this  time but overall, she does look improved and I think once we get things  and see how she does with dialysis, she can continue to move out of the  ICU.   My assessment then is that she has probable sepsis, which appears to be  potentially from urinary tract infection.  She has a pleural effusion,  malnutrition, end-stage renal disease, seizure disorder, encephalopathy,  and systemic lupus erythematosus.      Edward L. Luan Pulling, M.D.  Electronically Signed     ELH/MEDQ  D:  02/11/2008  T:  02/11/2008  Job:  [id number]

## 2010-07-30 NOTE — Group Therapy Note (Signed)
NAMESKYYE, GABA             ACCOUNT NO.:  [id number]   MEDICAL RECORD NO.:  [id number]          PATIENT TYPE:  INP   LOCATION:  A340                          FACILITY:  [facility]   PHYSICIAN:  Edward L. Luan Pulling, M.D.DATE OF BIRTH:  04-04-1975   DATE OF PROCEDURE:  02/09/2008  DATE OF DISCHARGE:                                 PROGRESS NOTE   Patient of Dr. Legrand Rams.  Dr. Legrand Rams has told me that he intends to send [name] back to the skilled care facility today.  So, I will plan to  sign off.   Thanks for allowing me to see her with you.      Edward L. Luan Pulling, M.D.  Electronically Signed     ELH/MEDQ  D:  02/09/2008  T:  02/09/2008  Job:  [id number]

## 2010-07-30 NOTE — Consult Note (Signed)
NAMEDARELI, IZZARD NO.:  [id number]   MEDICAL RECORD NO.:  [id number]          PATIENT TYPE:  INP   LOCATION:  2917                         FACILITY:  [facility]   PHYSICIAN:  Leonides Sake. Lucia Gaskins, M.D.DATE OF BIRTH:  1975-07-27   DATE OF CONSULTATION:  04/03/2007  DATE OF DISCHARGE:                                 CONSULTATION   REASON FOR CONSULTATION:  Evaluate patient with diffuse head and neck  swelling and cellulitis.   BRIEF HISTORY:  Deandre Brownfield is a 34 year old female with history of  SLE, seizure disorder and mental retardation.  She has had a several-day  history of sore throat, poor p.o. intake.  Was seen at [facility], where  she had a CT scan of the head and neck which showed diffuse swelling of  the neck consistent with cellulitis with questionable epiglottitis and a  questionable retropharyngeal abscess.  She is not having any airway  problems.  She is transferred to [facility] for IV antibiotics and  further care.  On initial evaluation by the medical service in [facility], she is dehydrated with renal failure secondary to dehydration.  She has diffuse neck swelling but no airway problems.   On exam at bedside, the patient has diffuse swelling of neck bilaterally  as well as the submental region.  Oral exam was clear.  She is having no  airway problems.  No stridor.   Review of CT scan at [facility]:  The patient has diffuse adenopathy on  both sides of the neck with some necrotic nodes as well as a diffuse  swelling of the palatine tonsils.  She has diffuse swelling of  hypopharyngeal mucosa as well as epiglottis and has an isolated area of  swelling, questionable small abscess on the distal end of the left AE  fold just above the arytenoid.  This was read as a questionable  retropharyngeal abscess, but the retropharynx did not show any evidence  of abscess and this area of swelling is actually in the left AE fold.   IMPRESSION:   Diffuse cellulitis, adenitis.  Questionable small abscess  involving the left aryepiglottic region.  There is no clinical evidence  of airway compromise.   RECOMMENDATIONS:  Treat with broad-spectrum IV antibiotics.  Obtain  blood cultures.  Will need repeat CT scan with contrast for evaluation  of neck swelling as well as the left AE fold region to rule out any  developing abscess.           ______________________________  Leonides Sake Lucia Gaskins, M.D.     CEN/MEDQ  D:  04/03/2007  T:  04/03/2007  Job:  [id number]

## 2010-07-30 NOTE — Group Therapy Note (Signed)
NAMECARMYNN, [name]             ACCOUNT NO.:  [id number]   MEDICAL RECORD NO.:  [id number]          PATIENT TYPE:  INP   LOCATION:  IC06                          FACILITY:  [facility]   PHYSICIAN:  Edward L. Luan Pulling, M.D.DATE OF BIRTH:  1975/05/13   DATE OF PROCEDURE:  DATE OF DISCHARGE:                                 PROGRESS NOTE   Patient of Dr. Legrand Rams.   [name] seems much better.  She is awake and alert.  She has been  extubated.  She has a respiratory rate of about 30 and a heart rate of  about 130, but overall she does look better.   Her exam this morning, as mentioned her heart rate is about 130, blood  pressure 167/93, respirations 23, O2 sats 99% that is on nasal cannula.  Her chest is fairly clear.  She does have the tachycardia.  She has much  less edema than before.   Her CBC shows white count 18,400, hemoglobin is 10, platelets 237 and  BMET shows potassium is 3.3, BUN of 20, creatinine 1.82.   ASSESSMENT:  She is better from a respiratory point of view.  She still  has significant problems with her renal function, etc., and with her  lupus, but overall I think she is improved.      Edward L. Luan Pulling, M.D.  Electronically Signed     ELH/MEDQ  D:  01/06/2008  T:  01/06/2008  Job:  [id number]

## 2010-07-30 NOTE — Op Note (Signed)
NAMEHILTON, [name]:  [id number]   MEDICAL RECORD NO.:  [id number]          PATIENT TYPE:  INP   LOCATION:  5531                         FACILITY:  [facility]   PHYSICIAN:  Lowella Bandy. Olevia Perches, MD     DATE OF BIRTH:  08-Mar-1976   DATE OF PROCEDURE:  04/19/2007  DATE OF DISCHARGE:                               OPERATIVE REPORT   PROCEDURE:  Flexible sigmoidoscopy.   INDICATIONS FOR PROCEDURE:  This 35 year old African-American female  with systemic lupus, CNS involvement, acute renal failure on high dose  steroids has developed a watery incontinent diarrhea, generalized  abdominal tenderness.  Clostridium difficile toxin has been negative  although she has been on multiple antibiotics for cellulitis.  She is  undergoing flexible sigmoidoscopy to assess for infectious colitis or  pseudomembranous colitis.   ENDOSCOPE:  Olympus single channel videoscope.   SEDATION:  Versed 5 mg IV, fentanyl 50 mcg IV.   FINDINGS:  Olympus single channel videoscope passed directly through the  rectum to the sigmoid colon.  The patient was monitored by pulse  oximetry and her oxygen saturations were normal and she was cooperative.  The patient's prep was only marginal.  She had yellow solid stool  throughout the left colon.  The colonic mucosa itself appeared normal.  Rectal tone was somewhat decreased, but the mucosa was normal.  There  was no diverticulosis or any evidence of inflammation.  There was solid  yellow stool in the sigmoid colon and descending colon.  Examination was  carried up to 60 cm.  Random biopsies of the left colon were obtained to  rule out microscopic colitis.   IMPRESSION:  Essentially normal left colon to 60 cm with solid formed  stool in the left colon, status post random biopsies.   PLAN:  The patient's diarrhea has improved on Flagyl, Questran, and  discontinuation of her hyperosmotic dietary supplements.  We will also  check stool for __________  stain.  We will continue the current measures  and await the results of the biopsies.      Lowella Bandy. Olevia Perches, MD  Electronically Signed     DMB/MEDQ  D:  04/19/2007  T:  04/20/2007  Job:  [id number]   cc:   Edythe Lynn, M.D.

## 2010-07-30 NOTE — Group Therapy Note (Signed)
NAMENEETU, BOKA             ACCOUNT NO.:  [id number]   MEDICAL RECORD NO.:  [id number]          PATIENT TYPE:  INP   LOCATION:  IC06                          FACILITY:  [facility]   PHYSICIAN:  Edward L. Luan Pulling, M.D.DATE OF BIRTH:  [birth year]   DATE OF PROCEDURE:  DATE OF DISCHARGE:                                 PROGRESS NOTE   Ms. Teater was dialyzed yesterday and seems to have done fairly well.  She remains intubated and on the ventilator.  She has no other new  complaints.   PHYSICAL EXAMINATION:  VITAL SIGNS:  Pulse 100, blood pressure 155/99,  respirations 14, and O2 sats 100%.  CHEST:  Fairly clear.  HEART:  Regular.  ABDOMEN:  Soft.  EXTREMITIES:  She does still have some edema.   Her blood gas shows pO2 of 169, pCO2 of 37, pH 7.44 this on 40%  __________ and a rate of 14.  CBC shows white count 14,900, hemoglobin  is 10, platelets 200 and BMET shows potassium is 3.1, BUN 23, and  creatinine 1.82.   ASSESSMENT:  She is better.   PLAN:  Plan is to see if we can do perhaps wean her today.  She still  has mostly cleared air space disease on her chest x-ray, there is still  some mild edema, but I think overall it is better.  We will see if she  can come off.  She may need another dialysis before she can do that.      Edward L. Luan Pulling, M.D.  Electronically Signed     ELH/MEDQ  D:  01/04/2008  T:  01/04/2008  Job:  [id number]

## 2010-07-30 NOTE — H&P (Signed)
NAMESUKHMANI, HALTERMAN             ACCOUNT NO.:  [id number]   MEDICAL RECORD NO.:  [id number]          PATIENT TYPE:  INP   LOCATION:  A329                          FACILITY:  [facility]   PHYSICIAN:  Tesfaye D. Legrand Rams, MD   DATE OF BIRTH:  [birth year]   DATE OF ADMISSION:  12/16/2007  DATE OF DISCHARGE:  LH                              HISTORY & PHYSICAL   CHIEF COMPLAINT:  Bleeding from left arm PICC line.   HISTORY OF PRESENT ILLNESS:  This is a 35 year old female patient with  complicated medical history who is currently a resident of [facility] brought to emergency room with above complaint.  The  patient had a PICC line inserted in [facility] yesterday.  However, since the insertion, the patient developed continuous slow  bleeding from the PICC line site.  She was brought to emergency room for  evaluation.  The patient was found to drop in her hemoglobin and  hematocrit.  Her hemoglobin was 6.9 and hemoglobin was 22.9.  She has  also worsening of her renal function as well.  The patient was then  started on IV fluids and admitted for type crossmatch in further  treatment.   REVIEW OF SYSTEMS:  The patient complains of generalized weakness and  edema.  Her appetite has been very poor.  No fever, chills, headache,  chest pain, shortness of breath, nausea, vomiting, abdominal pain,  dysuria, urgency, or frequency of urination.   PAST MEDICAL HISTORY:  1. Chronic renal failure secondary to lupus nephritis.  2. Anasarca.  3. Systemic lupus erythematosus.  4. Severe protein calorie malnutrition.  5. Anxiety/depression disorder.  6. Anemia of chronic disease.  7. Hypercalcemia.  8. Severe deconditioning.   MEDICATIONS:  1. Metoprolol 100 mg b.i.d.  2. Megace 400 mg b.i.d.  3. Prilosec 20 mg daily.  4. Niacin 500 mg daily.  5. Zoloft 150 mg daily.  6. Ergocalciferol 50,000 units subcu every weekly.  7. MiraLax 17 g daily.  8. Norvasc 10 mg daily.  9. Neurontin  100 mg daily.  10.Plaquenil 200 mg daily.  11.Multivitamin 1 tablet daily.  12.ProStat 30 mL b.i.d.  13.Calcium carbonate 500 mg t.i.d.  14.Remeron 50 mg at bedtime.  15.Zaroxolyn 5 mg daily.  16.Demadex 60 mg b.i.d.  17.Ensure 240 mg b.i.d.  18.Oxycodone every 5 mg q.6 h. p.r.n.  19.Anusol-HC ointment p.r.n.   SOCIAL HISTORY:  The patient is currently a resident of [facility].  She has been in and out of hospital with edema and renal failure.  No history of alcohol, tobacco, or substance abuse.   PHYSICAL EXAMINATION:  The patient is alert, awake, and chronically sick-  looking.  VITAL SIGNS:  Blood pressure 131/77, pulse 112, respiratory rate 16,  temperature 98 degrees Fahrenheit.  HEENT:  Pupils are equal and reactive.  NECK:  Supple.  CHEST:  Decreased air entry bilateral rhonchi.  CARDIOVASCULAR:  First and second heart sound heard.  No murmur, no  gallop.  ABDOMEN:  Soft and lax.  Bowel sound is positive.  No mass or  organomegaly.  EXTREMITIES:  4+ edema.   Labs on admission BNP, sodium 144, potassium 5.3, chloride 115, carbon  dioxide 15, glucose 112, BUN 120, creatinine 4.8, calcium 8.6.  CBC, WBC  22, hemoglobin 6.9, hematocrit 20.6, and platelets 240.   ASSESSMENT:  1. Anemia secondary to multifactorial causes including recent blood      loss.  2. Worsening chronic renal failure secondary to lupus nephritis.  3. Protein calorie malnutrition.  4. Anasarca slightly improved.  5. Anxiety depression disorder.  6. Systemic lupus erythematosus.  7. Severe deconditioning.   PLAN:  We will admit the patient and type crossmatch and transfuse 3  units of red blood cells.  We will continue the patient on IV fluids.  We will do nephrology consult.  We will continue the patient on the  current treatment and will do a strict intake and output monitoring as  well as daily weight.      Tesfaye D. Legrand Rams, MD  Electronically Signed     TDF/MEDQ  D:   12/16/2007  T:  12/17/2007  Job:  [id number]

## 2010-07-30 NOTE — Discharge Summary (Signed)
[name], [name]:  [id number]   MEDICAL RECORD NO.:  [id number]          PATIENT TYPE:  INP   LOCATION:  [id number]                         FACILITY:  [facility]   PHYSICIAN:  Girard Cooter, MD         DATE OF BIRTH:  Jul 12, 1975   DATE OF ADMISSION:  04/02/2007  DATE OF DISCHARGE:  04/23/2007                               DISCHARGE SUMMARY   ADDENDUM:  Please refer to Dr. Sampson Goon discharge summary dated April 16, 2007.   HOSPITAL COURSE:  Problem 1.  PHARANGEAL ABSCESS, WHICH HAS SINCE RESOLVED:  As stated  previously, the patient was admitted to the intensive care unit,  received intravenous antibiotics and fluids with vancomycin and Unasyn.  Multiple blood cultures since have been negative.  Since that time she  has been afebrile and she has been able to swallow without any  difficulty.  Since that time the ear, nose and throat doctor, Dr.  Chana Bode, has signed off.   Problem 2.  ACUTE RENAL FAILURE:  On April 16, 2007, her creatinine  was 3.8.  She continued to be monitored by [employer] on  a daily basis.  Currently her creatinine is 2.36.  again, per Dr. Anette Guarneri  discharge summary, the patient has a confirmed SLE diagnosis with  positive double-stranded DNA antibody and positive ANA antibody.  Given  her longstanding lupus erythematosus it would be possible, besides the  fact that she is having possible ATN from contrast nephropathy, that she  may also have underlying lupus nephritis.  Patient status post three  days of pulse steroids, which started April 12, 2007.  Her creatinine  has since decreased to 3.8.  It was thought by nephrology that the  patient is not a candidate for either immunosuppressant agents secondary  to longstanding comorbidities and compliance issues.  Since that time  nephrology has signed off.   Problem 3.  SEVERE METABOLIC ACIDOSIS SECONDARY TO RENAL FAILURE:  Resolved.   Problem 4.  HISTORY OF SEVERE  LUPUS:  The patient is to continue full  supervision.  She is clearly incompetent to make decisions.  She has a  guardian appointmentship by [facility] and should  be going to a skilled nursing facility, which has already been arranged.   Problem 5.  The patient's hospital course was complicated by having  persistent diarrhea and abdominal pain.  Stool studies, C. difficile  toxin were obtained.  The patient was started on empiric Flagyl.  Gastroenterology consult was requested.  The patient underwent a  flexible sigmoidoscopy on February 2, which was essentially normal in  the left colon.  The patient was advised to continue Flagyl and Questran  and it was recommended that she discontinue her hyperosmotic diet  supplement.  Since that time the patient's diarrhea has resolved.  She  is able to tolerate a regular diet.  Also a nutrition consult was  requested.  It was noted that her supplements were discontinued but it  was recommended by nutrition to restart them.  It was recommended to  change to Ensure With  Fiber with lower osmolarity than Ensure to  decrease the level of diarrhea.   Problem 6.  DEPRESSION:  This was noted by me when I saw her on the  floor on April 18, 2007.  The patient was tearful although taking into  account her multiple medical problems including SLE cerebritis and  encephalopathy and psychomotor retardation, she could still have  underlying depression.  In review of the records it was noted that she  was at some point on Paxil and somehow prior to this admission it was  switched to Zoloft.  In this regard, I started her back up on her Zoloft  25 mg x1 week and then 50 mg upon discharge.   Problem 7.  CHRONIC DECONDITIONING SECONDARY TO LONGSTANDING  HOSPITALIZATION:  A physical therapy consult was requested.  Essentially  they worked with her in the hospital.  It was noted that she has some  ankle dorsiflexion.  In this regard I  ordered her a resting foot splint.  This was thought to be secondary to her central deficits and spasticity.  She will need continuing physical therapy at the nursing home facility.   DISCHARGE MEDICATIONS:  1. Amlodipine (Norvasc) 10 mg p.o. nightly.  2. Calcitriol 0.5 mcg p.o. daily.  3. Questran 2 g p.o. q.24h. x3 additional days.  4. Aranesp 100 mcg subcutaneous every Thursday.  5. Lovenox 30 mg subcutaneous daily.  6. Ensure supplements 237 mL p.o. b.i.d.  7. Lopressor (metoprolol) 100 mg p.o. b.i.d.  8. Flagyl 250 mg p.o. t.i.d. for an additional 5 days.  9. Eucerin application, apply to dry skin three times a day.  10.Oxycodone 10 mg p.o. q.12h. p.r.n.  11.Protonix 40 mg p.o. daily.  12.She is to continue prednisone 60 mg p.o. daily for an additional 8      weeks.  13.Zoloft 50 mg p.o. daily.  14.Sodium bicarbonate tablets 1950 mg p.o. three times a day with      meals.  15.Tylenol 650 mg p.o. every 4 hours as needed.  [id number] cream application to her sacral area.  17.Dulcolax 10 mg per rectum p.r.n.  [id number] topical application to area p.r.n.  19.Zofran 4 mg IV q.4h. p.r.n.   DIET:  The patient is to resume a renal diet with 2 g of sodium.      Girard Cooter, MD  Electronically Signed     RR/MEDQ  D:  04/23/2007  T:  04/23/2007  Job:  [id number]   cc:   Nat Christen, M.D.  Donato Heinz, M.D.  Lowella Bandy. Olevia Perches, MD

## 2010-07-30 NOTE — Group Therapy Note (Signed)
[name], [name]             ACCOUNT NO.:  [id number]   MEDICAL RECORD NO.:  [id number]          PATIENT TYPE:  INP   LOCATION:  A319                          FACILITY:  [facility]   PHYSICIAN:  Edward L. Luan Pulling, M.D.DATE OF BIRTH:  [birth year]   DATE OF PROCEDURE:  DATE OF DISCHARGE:                                 PROGRESS NOTE   [name] is transferred out of the ICU, but continues to be pretty  sick.  She is awake and alert, but she has had fever.  Her temperature  now was 100.7, 99.9, and there was another one that was somewhat higher  while she was in the ICU.   Her exam this morning, she is awake and alert.  Blood pressure 165/90,  heart rate is 130, temperature as mentioned, and O2 sats 93% on 2 L.   Her CBC shows white count is 26,400.  BMET:  Potassium is 3.3, BUN is up  to 28, and creatinine 2.7.  Tracheal aspirate, no growth and blood  cultures were done on December 31, 2007, are still negative.   ASSESSMENT:  She remains quite sick.  Her source of her temperature is  not known at this point.  I have talk to Dr. Legrand Rams and I am going to  plan to order an another chest x-ray, etc and follow.      Edward L. Luan Pulling, M.D.  Electronically Signed     ELH/MEDQ  D:  01/07/2008  T:  01/07/2008  Job:  [id number]

## 2010-07-30 NOTE — Group Therapy Note (Signed)
NAMECASON, FALB             ACCOUNT NO.:  [id number]   MEDICAL RECORD NO.:  [id number]         PATIENT TYPE:  PINP   LOCATION:  A340                          FACILITY:  [facility]   PHYSICIAN:  Edward L. Luan Pulling, M.D.DATE OF BIRTH:  1975/05/22   DATE OF PROCEDURE:  DATE OF DISCHARGE:                                 PROGRESS NOTE   Ms. Molinelli is in dialysis, still having significant difficulties.  She  has no new complaints.  Her exam shows, blood pressure of 122/76,  temperature 98.5, pulse 118, and respirations 20.  She is complaining of  significant pain.  Chest is a little clearer.  The effusion seems to be  getting smaller, so I am happy with that.  We will plan to repeat a  chest x-ray in the morning and see where she is.  She has had another  thoracentesis which I think has been helpful, and I do not plan to  change any of her treatments now.      Edward L. Luan Pulling, M.D.  Electronically Signed     ELH/MEDQ  D:  02/05/2008  T:  02/05/2008  Job:  [id number]

## 2010-07-30 NOTE — H&P (Signed)
NAMEANDJELA, HJORTH             ACCOUNT NO.:  [id number]   MEDICAL RECORD NO.:  [id number]          PATIENT TYPE:  INP   LOCATION:  [id number]                          FACILITY:  [facility]   PHYSICIAN:  Tesfaye D. Legrand Rams, MD   DATE OF BIRTH:  28-Aug-1975   DATE OF ADMISSION:  12/31/2007  DATE OF DISCHARGE:  LH                              HISTORY & PHYSICAL   CHIEF COMPLAINT:  Fever and chills.   HISTORY OF PRESENT ILLNESS:  This is a 35 year old female patient with a  complicated medical history who was recently discharged from this  hospital to nursing home.  She was brought back with above complaint.  The patient was recently started on hemodialysis for worsening lupus  nephritis.  She had a prolonged hospital stay due to anasarca and  seizure disorder.  The patient was in nursing home for the last 5 days  since she was discharged from here.  She was receiving outpatient  hemodialysis; however, the patient developed fever in the range of 101  with chills yesterday.  The patient was brought to the emergency room  where she was evaluated.  Her chest x-ray showed left basilar air-space  opacity suspicious for pneumonia.  The patient also had abnormal  urinalysis with WBC too many to count.  She has also leukocytosis.  The  patient was started on IV antibiotics and she was admitted for further  treatment.   REVIEW OF SYSTEMS:  The patient complains of generalized weakness.  She  has intermittent to the right calf.  No chest pain, nausea, vomiting,  abdominal pain, dysuria, urgency, or frequency of urination.   PAST MEDICAL HISTORY:  1. End-stage renal failure on hemodialysis.  2. Anemia.  3. History of anasarca.  4. Systemic lupus erythematosus.  5. Seizure disorder.  6. History of ventilator-dependent respiratory failure.  7. Severe protein-calorie malnutrition.  8. Severe deconditioning.  9. Anxiety depression disorder.  10.Steroid-induced hyperglycemia.   CURRENT MEDICATIONS:  1.  Atrovent and Proventil nebulizer q.6 h.  2. Epogen 10,000 units during hemodialysis.  3. Norvasc 10 mg daily.  4. Gabapentin 100 mg daily.  5. Calcium carbonate 500 mg q.8 h.  6. Plaquenil 200 mg daily.  7. Megace 400 mg b.i.d.  8. Lopressor 100 mg b.i.d.  9. Remeron 15 mg nightly, for pain.  10.Multivitamin with minerals 1 tablet daily.  11.Niacin 500 mg once at bedtime.  12.Protonix 40 mg daily.  13.Dilantin 100 mg t.i.d.  14.MiraLax 17 g p.o. daily p.r.n.  15.Zoloft 175 mg daily.  16.Renagel 800 mg with meals.  17.Diflucan 100 mg p.o. daily for 5 more days.  18.Dulcolax suppository 10 mg daily as needed.   SOCIAL HISTORY:  The patient is currently disabled and she is a resident  of nursing home.  No history of alcohol, tobacco, or substance abuse.   PHYSICAL EXAMINATION:  GENERAL:  The patient is alert, awake, and  acutely sick looking.  VITAL SIGNS:  Blood pressure 155/77, pulse 127, respiratory rate 20, and  temperature 101.3 degrees Fahrenheit.  HEENT:  Pupils are equal and reactive.  NECK:  Supple.  CHEST:  Decreased air entry.  There are bilateral rhonchi at the bases.  CARDIOVASCULAR SYSTEM:  First and second heart sounds heard.  Tachycardic and regular.  ABDOMEN:  Soft and lax.  Bowel sound is positive.  No mass or  organomegaly.  EXTREMITIES:  Edema 3+.   LABORATORY DATA:  Urinalysis:  Specific gravity 1.015, pH is 6.0,  protein greater than 300.  Leuko esterase moderate, WBC too many to  count.  CBC:  WBC 7.5, hemoglobin 9.2, hematocrit 27.4, and platelet  222.  BMP:  Sodium 138, potassium 3.6, chloride 107, carbon dioxide 23,  glucose 201, BUN 51, creatinine 3.308, and calcium 7.8.   ASSESSMENT:  1. Left lower lobe pneumonia.  2. Urinary tract infection.  3. End-stage renal failure on hemodialysis.  4. Systemic lupus erythematosus.  5. Seizure disorder.  6. Protein-calorie malnutrition.  7. Anemia.  8. History of ventilator-dependence respiratory  failure.  9. Anxiety depression disorder.   PLAN:  We will do blood culture and urine culture.  We will continue  patient on combination of IV antibiotics.  We will do nephrology consult  to continue her hemodialysis.  We will continue the patient on her  regular medications.      Tesfaye D. Legrand Rams, MD  Electronically Signed     TDF/MEDQ  D:  12/31/2007  T:  12/31/2007  Job:  [id number]

## 2010-07-30 NOTE — Consult Note (Signed)
NAMECAYSE, WILLOCK             ACCOUNT NO.:  [id number]   MEDICAL RECORD NO.:  [id number]          PATIENT TYPE:  INP   LOCATION:  IC03                          FACILITY:  [facility]   PHYSICIAN:  R. Garfield Cornea, M.D. DATE OF BIRTH:  1976-02-06   DATE OF CONSULTATION:  02/14/2008  DATE OF DISCHARGE:                                 CONSULTATION   PRIORITY GASTROENTEROLOGY CONSULTATION   REFERRING PHYSICIAN:  Dr. Legrand Rams.   PRIMARY CARE PHYSICIAN:  ______________________.   REASON FOR CONSULTATION:  Possible PEG tube placement.   HISTORY OF PRESENT ILLNESS:  Ms. Depa is a 35 year old African  American female.  She has been hospitalized since February 09, 2008, and  was actually hospitalized prior to this with pneumonia.  She was sent  home and within the hour she was sent back to the nursing home and  within an hour she was returned to the hospital with chest pain.  She  has since undergone thoracentesis.  She is being treated for pneumonia  and sepsis.  She has a complicated history with significant weight loss,  failure to thrive, mentally challenged, systemic lupus erythematosus,  seizure disorder, chronic encephalopathy, gastroparesis, and a  torrential GI bleed in October of 2009.  We had been consulted  previously for PEG tube placement, but this woman has only declined.  She denies any abdominal pain at this time and denies any nausea or  vomiting.  She does have an NG tube in place and is tolerating feedings  well.  She has no residual per nursing staff.  She is having occasional  loose incontinent stools with tube feedings.   PAST MEDICAL AND SURGICAL HISTORY:  1. SLE with kidney disease and nephritis.  2. Osteoarthritis.  3. IDA.  4. Questioned GERD with torrentially bleeding  5. Peptic ulcer disease in October of 2009, which was the last EGD.  6. Hypertension.  7. Hyperglycemia.  8. Peripheral neuropathy.  9. Gastroparesis documented by gastric emptying study,  September 02, 2007.  10.A negative head CT on December 21, 2007.  11.A CT of the abdomen and pelvis without contrast on January 11, 2008.  _______________________.  12.A pericardial effusion and bilateral bibasilar pleural effusions.      She had a chest x-ray February 12, 2008, which showed bibasilar      opacities, pulmonary vascular congestion, interstitial edema, and a      right pleural effusion.  13.History of ventilator-dependent respiratory failure.  14.Seizure disorder.  15.Migraine headaches.  16.Cholelithiasis.  17.Benign hepatic lesions that were not seen on most recent CT.  18.Normal EGD by Dr. Juanita Craver on July 01, 2006.   MEDICATIONS PRIOR TO ADMISSION:  1. Renagel 800 mg with meals.  2. Zoloft 175 mg daily.  3. Neurontin 100 mg daily.  4. Lopressor 100 mg b.i.d.  5. Megace 400 mg b.i.d.  6. Plaquenil 200 mg daily.  7. Remeron 15 mg q.h.s.  8. Dilantin 200 mg b.i.d.  9. Cardizem-CD 207 mg daily.  10.Rena-Vite 1 tablet daily.  11.Protonix 40 mg daily.  12.Rocephin 1 g IV q.24  hours.  13.Maalox 17 g daily as needed.  14.Bisacodyl 10 mg per rectum p.r.n.  15.DuoNeb inhalers q.4 hours p.r.n.  16.Acetaminophen 500 mg q.6 hours p.r.n.  17.Xanax 0.25 mg q.6 hours p.r.n.   ALLERGIES:  No known drug allergies.   FAMILY HISTORY:  There is no known family history of __________________,  pneumonia, or chronic GI problems.   SOCIAL HISTORY:  Ms. Urbaniak resides at [facility].  She is a  ward of the [place].  Her mother lives in [place].  She does not have  contact with the patient on a regular basis.  She had previously resided  with her aunt.  Her aunt is caring for her 3 children.  Denies any  history of alcohol or drug use.  She quit smoking in 2006 with a 15-pack-  year history.   REVIEW OF SYSTEMS:  See HPI; otherwise, negative   PHYSICAL EXAMINATION:  VITAL SIGNS:  Temp 99.1, pulse 148, respirations  30, blood pressure 88/72, weight 52.2 kg, O2 SAT  98% on 2 L per minute.  GENERAL:  She is a thin, African American female, who is alert.  She  responds with head nods but is not communicative otherwise today.  HEENT:  Her sclerae are clear and nonicteric, conjunctivae pink.  Oropharynx is pink and moist without any lesions.  She does have an NG  tube intact.  NECK:  Supple without any thyromegaly.  CHEST:  With tachycardia.  LUNGS:  Clear to auscultation bilaterally.  ABDOMEN:  Positive bowel sounds x4, no __________________, nontender and  nondistended without pulse/hepatosplenomegaly, no rigidity or guarding.  EXTREMITIES:  Without edema.   LABORATORY STUDIES:  Hemoglobin is 7.7, hematocrit 23.5, white blood  cell count 16.9, platelets 535, calcium 8.3, sodium of 140, potassium 4,  chloride 110, CO2 21, BUN 17, creatinine 2.62, and glucose 100.  Total-  bili 10.5, alkaline-phosphatase 79, AST 23, ALT 18, total protein 4.7,  albumin 1.   IMPRESSION:  Ms. Baloga is a 35 year old African American female with  complicated medical history, who is mentally challenged, with a history  of systemic lupus erythematosus, seizure disorder, sepsis, pneumonia,  chronic encephalopathy, failure to thrive, gastroparesis, and  significant gastrointestinal bleeding in October of 2009.  We have been  consulted for a percutaneous endoscopic gastrostomy (PEG) placement, and  at this time Ms. Sonne does not want a percutaneous endoscopic  gastrostomy placement.  She is a ward of the [place].   PLAN:  1. I will discuss this further with Dr. Gala Romney as at this time Ms.      Kottke again does not to have a PEG tube placed.  She is doing      well with NG tube feedings at this time.   We would like to thank Dr. Legrand Rams for allowing [place] to participate in the  care of Ms. Starrett.      Vickey Huger, N.P.      Bridgette Habermann, M.D.  Electronically Signed    KJ/MEDQ  D:  02/14/2008  T:  02/14/2008  Job:  [id number]   cc:   Tesfaye D. Legrand Rams, MD   Fax: [phone]

## 2010-07-30 NOTE — Consult Note (Signed)
NAMEASUNCION, LUDLUM             ACCOUNT NO.:  [id number]   MEDICAL RECORD NO.:  [id number]          PATIENT TYPE:  INP   LOCATION:  IC07                          FACILITY:  [facility]   PHYSICIAN:  R. Garfield Cornea, M.D. DATE OF BIRTH:  04-Jul-1975   DATE OF CONSULTATION:  DATE OF DISCHARGE:  01/20/2008                                 CONSULTATION   REASON FOR CONSULTATION:  Torrential GI bleed.   HISTORY OF PRESENT ILLNESS:  Ms. Arnett Cardy is an unfortunately  mentally challenged 35 year old African American female who has been  hospitalized here since December 31, 2007, with pneumonia, respiratory  failure who has suffered a torrential GI bleed today.  According to  nursing staff, she started to have waxing and waning generalized  abdominal pain this morning and was noted to have multiple large grossly  bloody bowel movements this evening with associated hypertension and  tachycardia.  She was tachycardic earlier today.  There was no apparent  GI bleeding in fact.  Cardiology saw her for sinus tachycardia earlier  today.   She has not had any hematemesis.  There has been no melena.  She is  currently hypotensive with the blood pressure that is in 80 range  systolic and has a tachycardia 110 to 130 range.  Dr. Karie Kirks called me  and asked me to see this lady.   She is a mentally challenged.  She is a ward of the state and lives at  [facility].   I really get essentially no useful information from the patient  although, she is alert and conversant simply yes and no responses.   It is notable that her hemoglobin at 2025 today is 4.3 and hematocrit is  13.1.  Her INR is 2.6.  On January 08, 2008, her H and H was 9.2 and  27.2.  On January 06, 2008, her H and H was 10.0 and 30.4, previous to  that her hemoglobin was down to 7 range.  For this, she was transfused  with 3 units of packed blood cells.  She was hemoccult negative x2 on  January 05, 2008, and January 06, 2008, respectively.   Reporting to staff, she has had a very good appetite up until then when  she started having generalized abdominal pain.  Her oral intake today  has fallen off dramatically.   Plans are being made transfer her to ICU.  She was given a liter of  normal saline and 3 units of packed red blood cells have been ordered,  the first is hanging and 2 units of FFP have been thawed and are on  their way to the bedside at the time of this dictation.   In addition, she has received vitamin K 10 mg IV times 1 dose.  She is  getting hemodialysis regularly under the direction of Dr. Lowanda Foster; her  last session was yesterday.  We saw this lady in consultation back on  June 30, 2006, for failure to thrive and weight loss, has currently  placed on PEG at that time.  The patient was adamant about not having  the PEG placement.  She underwent an EGD by Dr. Stann Mainland on July 01, 2006,  which demonstrated a normal esophagus, stomach and duodenum.   PAST MEDICAL HISTORY:  1. She is mentally challenged nursing home her resident.  Longstanding      SLE was associated.  Renal failure, chronic hemodialysis, history      of the seizure disorder.  History of severe protein malnutrition      with anasarca, deconditioning anxiety, neurosis, depressed.  2. Encephalopathy secondary to SLE, history of hypertension,      peripheral neuropathy, osteoarthritis, and migraine headaches.   MEDICATIONS ON ADMISSION:  1. Atropine.  2. Epogen.  3. Norvasc.  4. Gabapentin.  5. Calcium carbonate.  6. Plaquenil.  7. Megace.  8. Lopressor.  9. Remeron.  10.Multivitamin.  11.Niacin.  12.Protonix.  13.Dilantin.  14.MiraLax.  15.Zoloft.  16.Renagel.  17.Diflucan.  18.Dulcolax.   CURRENT MEDICATIONS:  1. Albuterol.  2. Calcium acetate.  3. Diltiazem.  4. Epogen.  5. Fluconazole.  6. Gabapentin.  7. Hydrochloroquine.  8. Megestrol.  9. Metoprolol.  10.Mirtazapine.  11.Remeron.  12.Avelox.   13.Pantoprazole.  14.Phenytoin.  15.Vitamin K.  16.Zosyn.  17.Zoloft.  18.Renagel.  19.Vancomycin.   ALLERGIES:  No known drug allergies.   FAMILY HISTORY:  Negative for chronic GI or liver illness.   SOCIAL HISTORY:  The patient is [place] resident.  She has 3  young children.  She has the history of tobacco use, but not since 2006,  15-pack-year history.  No alchohol or illicit drugs.   REVIEW OF SYSTEMS:  Unobtainable at this time.   PHYSICAL EXAMINATION:  GENERAL:  Somewhat lethargic African American  female in room 318.  VITAL SIGNS:  Blood pressure 85/60, pulse rate 110.  SKIN:  Warm and dry.  HEENT:  No scleral icterus.  Conjunctivae are pale.  Oral cavity no  lesions.  CHEST AND LUNGS:  Poor inspiratory effort.  CARDIAC:  Tachycardia.  ABDOMEN:  Nondistended bowel sounds present.  No bruits.  The abdomen is  soft with diffuse guarding to palpation.  There is no rebound  tenderness.  There is a band-like firmness left lateral abdomen and  going for left upper quadrant until left lower quadrant laterally and  this seems to be an area of particular tenderness of the patient when  palpated.  No obvious hepatosplenomegaly.  EXTREMITIES:  Trace lower extremity edema.   LABORATORY DATA:  From this evening lipase is 14.  Hepatic function of  alkaline phosphatase 39, AST 39, ALT 22, albumin 1.0, total protein 3.7,  TSH from January 09, 2008 was 2.0.  White count from January 08, 2008  was 13,600, H & H  9.2 and  27.2.   IMPRESSION:  This is an unfortunate 35 year old African American female  with numerous medical problems.  She has had a 2 recent lengthy  hospitalizations related to pneumonia and respiratory failure requiring  ventilatory support and setting of renal failure and chronic  hemodialysis, who has now developed apparent torrential gastrointestinal  bleed, this likely explains her tachycardia to a good extent earlier  today and is well.   I am  deeply concerned about her abdominal pain in the setting of  gastrointestinal bleeding in her abdominal exam this evening.   The etiology of her bleeding is not clear.  There is multitude  possibilities, neither her upper or lower urinary tract as well as small  bowel slightly be concerned about the possibility of peptic ulcer  disease in addition, given her history of lupus, she is  at risk for  premature vascular disease and therefore gut ischemia in this setting.  It is also 1 of the many possibilities.   She does have a history of cholelithiasis on 2006 ultrasound.   RECOMMENDATIONS:  As discussed with Dr. Karie Kirks,  1. Moved to the ICU.  2. Change her PPI from the oral route to IV.  3. Agree with packed red blood cells.  She is likely need the 4 units      right off the bed and then possibly more than that over the next 24      hours.  4. Agree with vitamin K and FFP and it has been ordered.  5. Hold heparinization during dialysis at least in the immediate      future.  6. Discontinue niacin.  7. In addition to the above mentioned lab and check her lactic acid      level.  8. Eventually, endoscopic evaluation is feasible depending on her      stability.  9. Overall at this point in time her prognosis is guarded.  An albumin      of 1.0 and portends a poor outcome in this setting.   I thank Dr. Leslie Andrea, for his continuing confidence in me.  We  will be following Ms. Hairfield along with and closely.      Bridgette Habermann, M.D.  Electronically Signed     RMR/MEDQ  D:  01/11/2008  T:  01/12/2008  Job:  [id number]   cc:   Dr. Luan Pulling

## 2010-07-30 NOTE — Consult Note (Signed)
NAMETELENA, [name]             ACCOUNT NO.:  [id number]   MEDICAL RECORD NO.:  [id number]          PATIENT TYPE:  INP   LOCATION:  IC03                          FACILITY:  [facility]   PHYSICIAN:  Kofi A. Merlene Laughter, M.D. DATE OF BIRTH:  07/11/1975   DATE OF CONSULTATION:  DATE OF DISCHARGE:                                 CONSULTATION   REASON FOR CONSULTATION:  Seizures and altered mental status.   Patient is a 35 year old African American female who has a known history  of systemic lupus erythematosus.  The patient currently is a resident of  [facility].  She is being followed at [facility] for renal  failure secondary to lupus.  The patient was hospitalized because of  bleeding from her PICC line.  She had three generalized tonic/clonic  seizures on the floor and became unresponsive and apneic afterwards and  was sent to the ICU and intubated.  We saw her in consultation about a  year and a half ago, and she had altered mental status at that time.  She also had a couple of syncopal spells that were suspicious for  seizures and was on Dilantin, which was started by physicians in  [place].  She did have an EEG, which was unrevealing, here about a year  ago.  It is unclear why the Dilantin was discontinued.   PAST MEDICAL HISTORY:  1. Chronic renal failure secondary to lupus nephritis.  2. Anasarca.  3. Systemic lupus erythematosus.  4. Seizure disorder.  5. Anxiety disorder.  6. Depression, severe.  7. Protein malnutrition.  8. Chronic anemia.  9. Hypercalcemia.  10.Severe deconditioning.   ADMISSION MEDICATIONS:  1. Metoprolol 100 mg b.i.d.  2. Megace 400 mg b.i.d.  3. Prilosec 20 mg daily.  4. Niacin 500 mg daily.  5. Zoloft 150 mg daily.  6. Ergocalciferol 50,000 units subcu weekly.  7. MiraLax 17 gm daily.  8. Norvasc 10 mg daily.  9. Neurontin 100 mg.  10.Plaquenil 200 mg.  11.Multivitamins.  12.Pro-Stat 30 ml b.i.d.  13.Calcium carbonate 500 mg  t.i.d.  14.Remeron 50 mg at bedtime.  15.Zaroxolyn 5 mg.  16.Dermedex 60 mg b.i.d.  17.Ensure 200 mg b.i.d.  18.Oxycodone 5 mg p.r.n.  19.Anusol-HC ointment p.r.n.   SOCIAL HISTORY:  Patient is currently at [facility].  Patient has been in and out of the hospital due to renal failure and  significant edema.  No history of tobacco, alcohol, or illicit drug use.   PHYSICAL EXAMINATION:  Patient is currently intubated.  Temperature  98.9, pulse 120, respirations 24, blood pressure 214/126.  These vitals  were obtained after she was intubated and had her seizures.  Patient was examined just after Propofol was discontinued.  After a few  minutes, she did open her eyes to deep, painful stimuli.  The patient  does track and focuses, but she does not follow commands.  Cranial nerves evaluation:  The left pupil is 6 mm.  The right is 5.  Both are reactive.  Corneal reflexes are intact.  Oculocephalic reflexes  are intact.  Gag reflex is intact.  Motor  examination:  She has massive  edema, essentially anasarca of the body and extremities and only moves  the right leg minimally, 1+ to deep painful stimuli.  Again, she is  examined a couple of minutes after her Propofol is discontinued.  There  is no rigidity or cog-wheeling noted.  Coordination:  There is no  tremor.  Reflexes are markedly diminished or absent.   Head CT scan done after her event showed nothing acute.   Sodium 142, potassium 4.7, chloride 111, CO2 17, BUN 97, creatinine 4.3,  glucose 85, calcium 8.6.  WBC 19, hemoglobin 11.4, platelet count 166.  Calcium 8.5 on yesterday.  LFTs are normal.   ASSESSMENT:  1. Status epilepticus, resolved with a loading dose of Dilantin.  She      has been placed on 100 mg b.i.d., which may be a okay given her low      albumin and poor renal function.  2. Altered mental status due to multiple etiologies, including      postictal lethargy and metabolic derangement.  3. Systemic  lupus erythematosus:  This is most likely the cause of her      seizures.   RECOMMENDATIONS:  1. Agree with Dilantin.  Will just check a level in the morning.  2. EEG.  3. It may be worthwhile giving her pulse steroids over the next few      days and then weaning.   I will follow the patient and repeat her neurological exam.  Thanks for  this consultation.      Kofi A. Merlene Laughter, M.D.  Electronically Signed     KAD/MEDQ  D:  12/22/2007  T:  12/22/2007  Job:  [id number]

## 2010-07-30 NOTE — H&P (Signed)
NAMEVENETA, CICCONI             ACCOUNT NO.:  [id number]   MEDICAL RECORD NO.:  [id number]          PATIENT TYPE:  INP   LOCATION:  IC03                          FACILITY:  [facility]   PHYSICIAN:  Edward L. Luan Pulling, M.D.DATE OF BIRTH:  [birth year]   DATE OF ADMISSION:  02/09/2008  DATE OF DISCHARGE:  LH                              HISTORY & PHYSICAL   Patient of Dr. Legrand Rams.   REASON FOR ADMISSION:  Possible UTI and fever.   HISTORY:  Ms. Podraza is a 35 year old who has a very complicated  medical history and who has systemic lupus erythematosus, chronic renal  failure related to that.  She has been on hemodialysis.  She had been in  and out of the hospital the last several months.  She was hospitalized  from February 01, 2008, through February 09, 2008, and was in the  hospital then with a pneumonia and pleural effusion.  She had had  thoracentesis x3 with improvement, but continued to have some shortness  of breath.  She was transferred back to her nursing home today, but  within a very short time.  She began complaining of chest pain and  eventually was brought back to the emergency room.   Her past medical history is exceptionally complicated and includes  history of ventilator dependent respiratory failure, sepsis, history of  UTIs, chronic renal failure on hemodialysis, pneumonia, systemic lupus  erythematosus, seizure disorder, protein-calorie malnutrition,  pericardial effusions, pleural effusions, tachy arrhythmias, multiple  electrolyte abnormalities, depression, poor failure to thrive.   SOCIAL HISTORY:  She lives at [facility].  She does not use any  alcohol, tobacco, or substances.   FAMILY HISTORY:  Unknown.   REVIEW OF SYSTEMS:  Except as mentioned is negative.   Physical exam shows that she looks chronically ill.  Her blood pressure  is in the 80s.  It was about 100 when she was leaving the hospital, so  it has gone down some.  Pulses about 100.  Her  temperature is 100.8.  Her pupils are reactive.  Her mucous membranes are dry.  Her neck is  supple.  Her chest shows decreased breath sounds bilaterally.  Her heart  is regular without gallop.  Her abdomen is soft without masses.  The  extremities showed no edema.  Pulses are intact.   LABORATORY WORK:  White blood count is 23,000 with a hemoglobin of 9.5,  platelets 482.  Initial cardiac markers, myoglobin is 500, but troponin  is less than 0.05.  Chest x-ray shows that she has a dialysis catheter,  a left-sided PICC line.  She has a tiny left apical pneumothorax, a left  pleural effusion which is fairly small, a right-sided pleural effusion  as well.  There is said to be congestive heart failure.  Other labs are  pending.   ASSESSMENT:  Her urine was very infected in appearance.  I think at this  point since she is having chest pain and her history is so complicated  and she is somewhat hypotensive, although not a great deal different  than her baseline, I am going  to go and admit her in the ICU.  I am  going to put her on Avelox because that will help since she has  renal dysfunction, vancomycin, and Zosyn.  We would do cultures.  She  has had a fluid bolus in the emergency room.  She is going to have IV  fluids.  We will discuss with the nephrologist.  Continue with her  regular medications and follow.      Edward L. Luan Pulling, M.D.  Electronically Signed     ELH/MEDQ  D:  02/09/2008  T:  02/10/2008  Job:  [id number]

## 2010-07-30 NOTE — Op Note (Signed)
NAMELINNA, [name]             ACCOUNT NO.:  [id number]   MEDICAL RECORD NO.:  [id number]          PATIENT TYPE:  INP   LOCATION:  A303                          FACILITY:  [facility]   PHYSICIAN:  Theotis Burrow IV, MDDATE OF BIRTH:  January 17, 1976   DATE OF PROCEDURE:  12/21/2007  DATE OF DISCHARGE:  12/28/2007                               OPERATIVE REPORT   PREOPERATIVE DIAGNOSIS:  End-stage renal disease.   POSTOPERATIVE DIAGNOSIS:  End-stage renal disease.   PROCEDURES PERFORMED:  1. Ultrasound evaluation and access of right internal jugular vein.  2. Diatek catheter placement.   TYPE OF ANESTHESIA:  MAC.   BLOOD LOSS:  Minimal.   SPECIMENS:  None.   PROCEDURE:  The patient was identified in the holding area and taken to  room 17.  She was placed supine on the table.  MAC anesthesia was  administered.  The patient's neck and chest were prepped and draped in a  standard sterile fashion.  The right internal jugular vein was evaluated  with ultrasound and found to be easily compressible and widely patent.  Lidocaine 1% was used for local anesthesia.  The right internal jugular  vein was accessed with an 18-gauge needle under ultrasound guidance.  An  035 wire was then advanced into the inferior vena cava under  fluoroscopic visualization.  Dilators were then used to dilate  subcutaneous tract and peel-away sheath was placed.  A 28-cm Diatek  catheter was then advanced through the peel-away sheath, which was then  removed.  The site was selected for the skin exit site.  Lidocaine 1%  was used to anesthetize this area as well as the anticipated tunnel.  A  #11 blade was used to make a skin incision, subcutaneous tunnel was then  created, and then dilated with dilator from the kit.  Catheter was then  pulled through the tunnel and the cuff was placed in the skin exit site.  Fluoroscopy was used to confirm that there were no kinks within the  catheter and the catheter tip was in  the cavoatrial junction.  Both  ports flushed and aspirated without difficulty.  Catheter was sutured  into place with a 3-0 nylon.  The skin incision of the neck was closed  with 4-0 Vicryl.  Both ports flushed and aspirated without difficulty  and appropriate volume of heparin was  placed.  Sterile dressing was  applied.  There were no complications.           ______________________________  V. Leia Alf, MD  Electronically Signed     VWB/MEDQ  D:  12/21/2007  T:  12/22/2007  Job:  [id number]

## 2010-07-30 NOTE — Group Therapy Note (Signed)
[name]             ACCOUNT NO.:  [id number]   MEDICAL RECORD NO.:  [id number]          PATIENT TYPE:  INP   LOCATION:  A303                          FACILITY:  [facility]   PHYSICIAN:  Edward L. Luan Pulling, M.D.DATE OF BIRTH:  12-18-1975   DATE OF PROCEDURE:  DATE OF DISCHARGE:  12/28/2007                                 PROGRESS NOTE   Ms. Burow overall looks much better.  She was dialyzed again yesterday  and did quite well with that she has lost more fluid.  White count  12,100, hemoglobin 9, hematocrit 27.1, and platelets 186,000.  Her  electrolytes are normal with BUN of 30 and creatinine of 2.12.   Her physical examination this morning shows her chest is much clearer  than it has been.  Her heart is regular.  Abdomen is soft.   ASSESSMENT:  She is much improved.   PLAN:  Continue with treatment.  I will probably follow her a bit more  peripherally as we go.      Edward L. Luan Pulling, M.D.  Electronically Signed     ELH/MEDQ  D:  12/26/2007  T:  12/26/2007  Job:  [id number]

## 2010-07-30 NOTE — Group Therapy Note (Signed)
NAMECEASIA, DEFRAIN             ACCOUNT NO.:  [id number]   MEDICAL RECORD NO.:  [id number]          PATIENT TYPE:  INP   LOCATION:  A319                          FACILITY:  [facility]   PHYSICIAN:  Edward L. Luan Pulling, M.D.DATE OF BIRTH:  28-Oct-1975   DATE OF PROCEDURE:  DATE OF DISCHARGE:                                 PROGRESS NOTE   Ms. Angelica continues to be uncomfortable and was moaning with pain.  She has no other new complaints.   Her exam otherwise; temperature is 99, blood pressure 152/101,  respirations 20, pulse 124.  Temperature has been as high as 100  basically midnight last night, 101.2 before that, 100.9 before that.  We  still do not have a good source for her fever.  Chest x-ray done  yesterday showed increased bibasilar air space disease, and I think that  probably represents fluid.  Her BMET shows BUN 17, creatinine 2.1.  CBC;  white count 13,600, hemoglobin 9.2, platelets 249.  I do not plan to  change anything today and will plan to follow.      Edward L. Luan Pulling, M.D.  Electronically Signed     ELH/MEDQ  D:  01/08/2008  T:  01/08/2008  Job:  [id number]

## 2010-07-30 NOTE — Group Therapy Note (Signed)
[name], MARCHITTO             ACCOUNT NO.:  [id number]   MEDICAL RECORD NO.:  [id number]          PATIENT TYPE:  INP   LOCATION:  IC03                          FACILITY:  [facility]   PHYSICIAN:  Kofi A. Merlene Laughter, M.D. DATE OF BIRTH:  [birth year]   DATE OF PROCEDURE:  12/23/2007  DATE OF DISCHARGE:                                 PROGRESS NOTE   The patient continues to be intubated and sedated.  She already has been  attempted to be weaned because of significant fluid.  She did get  hemodialysis yesterday.  She was examined today.  She was sick, and  after  taken off propofol she actually has been more responsive today,  opened her eyes faster, and moving her extremities especially the right  side more vigorously.  Again, eyes are open.  She does have intact  brainstem reflexes with intact gag and corneal reflexes.  Pupils are  anisocoric again with the left being 5 and the right being 4.  Both are  reactive.  She continues to have significant anasarca.  Dilantin level  is 9.0, which is probably fine given that she has renal impairment and  low albumin.  Sodium 139, potassium 4.5, chloride 110, CO2 of 20, BUN 9,  creatinine 3.6, glucose 213.  WBC 10, hemoglobin 9.9, platelet count of  139, and calcium 8.7.   ASSESSMENT:  Multiple seizures, resolved, status epilepticus, and she  does have encephalopathy likely due to medication and nerve block  processes including acute illness.  Certainly, cognitive impairment is  present from lupus.  We will continue with the current Dilantin.  She  did have an EEG, which did not show any ongoing seizure activity.  We  will also continue with steroid bolus and wean thereafter.      Kofi A. Merlene Laughter, M.D.  Electronically Signed     KAD/MEDQ  D:  12/23/2007  T:  12/23/2007  Job:  [id number]

## 2010-07-30 NOTE — Consult Note (Signed)
NAMEALTOVISE, [name]             ACCOUNT NO.:  [id number]   MEDICAL RECORD NO.:  [id number]          PATIENT TYPE:  INP   LOCATION:  [id number]                          FACILITY:  [facility]   PHYSICIAN:  Alison Murray, M.D.DATE OF BIRTH:  June 12, 1975   DATE OF CONSULTATION:  12/31/2007  DATE OF DISCHARGE:                                 CONSULTATION   REASON FOR CONSULT:  End-stage renal disease for hemodialysis.   [name] is a 35 year old African American who has past medical  history of lupus, history of hypertension, depression, end-stage renal  disease on maintenance hemodialysis presently sent from nursing home  because of fever.  [name] was dialyzed yesterday.  End of dialysis,  the patient did not have any fever, chills or sweating.  This was her  first dialysis after she was discharged from the hospital.  Because of  her severe depression, the patient most of the time noncommunicative  hence very difficult to get any additional history.   PAST MEDICAL HISTORY:  As stated above.  The patient has lupus, history  of hypercalcemia, history of depression, history of severe malnutrition,  history of hypertension, history GERD, history of deconditioning,  history of anemia, history of end-stage renal disease on maintenance  hemodialysis.   MEDICATIONS:  1. Norvasc 10 mg p.o. daily.  2. Os-Cal 500 mg p.o. t.i.d.  3. She is getting Diflucan 100 mg p.o. daily.  4. Celebrex 500 mg daily.  5. Neurontin 100 mg p.o. daily.  6. Plaquenil 200 mg p.o. daily.  7. Megace 400 mg p.o. b.i.d.  8. Lopressor 100 mg p.o. b.i.d.  9. Remeron 15 mg p.o. nightly.  10.Niaspan 500 mg p.o. nightly.  11.Protonix 40 mg p.o. daily.  12.Dilantin 100 mg p.o. daily.  13.Zosyn 3.3 mg every q.h.  She is also on Zoloft 175 mg p.o. daily,      Renagel 800 mg p.o. daily, Vancomycin she received 100 mg IV q.24      h.  She is getting Atrovent inhaler and also MiraLax.   ALLERGIES:  No known allergies.   SOCIAL HISTORY:  She is resident of [facility].  No history of  smoking and no history also alcohol abuse.   REVIEW OF SYSTEMS:  At this moment not available, for every question the  patient shake her head. At this moment, she does not seem to have any  chest pain, no nausea, and no vomiting.   PHYSICAL EXAMINATION:  VITAL SIGNS:  Her temperature is 99.8, pulse of  120, blood pressure is 149/98.   Her white blood count of 9.2, hematocrit 27.4.  Her sodium 178,  potassium 3.5, BUN is 27, creatinine 2.27.  LFTs are normal.   ASSESSMENT:  1. Fever multiple source, she has IV line for antibiotics when she was      discharged and also she has a catheter.  Chest x-ray, there is a      question of pneumonia hence could be multiple source.  Presently,      she is afebrile, but she has leukocytosis.  2. End-stage, she is status post hemodialysis.  BUN and creatinine was      within acceptable, normal potassium.  She does not have any sign of      fluid overload.  3. History of hypertension.  Blood pressure seems to be reasonably      controlled.  4. History of lupus.  5. History of seizure.  No seizure activity.  6. History of deconditioning.  7. Malnutrition.  8. She has history of respiratory failure intubated and improved.   RECOMMENDATIONS:  We will make arrangement for the patient to get  dialysis tomorrow.  Possibly, we will continue her vancomycin and also  we will give her some gentamicin.  We will continue her other  medications and we will follow her blood culture.  At this moment, since  she has multiple reasons to have an infection, we will hold removing her  catheter.      Alison Murray, M.D.  Electronically Signed     BB/MEDQ  D:  12/31/2007  T:  12/31/2007  Job:  [id number]

## 2010-07-30 NOTE — Discharge Summary (Signed)
[name], [name]:  [id number]   MEDICAL RECORD NO.:  [id number]          PATIENT TYPE:  INP   LOCATION:  5531                         FACILITY:  [facility]   PHYSICIAN:  Edythe Lynn, M.D.       DATE OF BIRTH:  1976-02-18   DATE OF ADMISSION:  04/02/2007  DATE OF DISCHARGE:                               DISCHARGE SUMMARY   PRIMARY CARE PHYSICIAN:  Nat Christen, M.D. in [place], [place].   DISCHARGE DIAGNOSES:  1. Cellulitis of the face with small retropharyngeal phlegmon.  2. Longstanding history of systemic lupus erythematosus.  3. Chronic encephalopathy and seizure disorder felt to be secondary to      cerebral lupus.  4. Anemia of chronic disease.  5. Mental retardation.  6. Acute renal failure, multifactorial.   Medications and followup will be dictated at the time of the actual  discharge of the patient.   CONSULTATION:  1. Dr. Melony Overly from otorhinolaryngology.  2. [employer].   PROCEDURES:  1. On April 02, 2007, the patient underwent CT scan of the neck with      intravenous contrast with findings of edema of the subcutaneous      tissue in submandibular region, thickening of the epiglottis and      small left retropharyngeal phlegmon.  2. On April 05, 2007, renal ultrasound with findings of echogenic      kidney suggesting medical renal disease and cholelithiasis.  3. On April 09, 2007, placement of a peripherally inserted central      catheter line.   HISTORY OF PRESENT ILLNESS:  For admission history and physical, refer  to the dictated H&P which was done by Dr. Jana Hakim on April 02, 2007.   HOSPITAL COURSE:  Problem 1.  RETROPHARYNGEAL ABSCESS:  [name] was  admitted to the intensive care unit.  She received generous intravenous  fluids and broad-spectrum antibiotics with vancomycin and Unasyn.  The  patient had multiple blood cultures all of which were negative.  She  improved  significantly and by April 12, 2007, she was taken off of the  antibiotics.  She is currently afebrile and able to swallow without any  difficulties.   Problem 2.  ACUTE RENAL FAILURE:  Starting on September 02, 2007, it was  noted that the patient's creatinine was rising.  The patient has a  baseline normal creatinine at 0.65.  Upon admission, the creatinine was  a 1.6.  On September 02, 2007, has risen to 2.1.  Over the course of the  hospitalization, the patient was seen on a daily basis by the nephrology  service and under their careful observation, the creatinine has  continued to rise to a peak level of 4.9 on April 10, 2007.  When I  assumed care of this patient, I questioned if the patient may have lupus  nephritis.  Carefully studying her old records, it seems that she has a  confirmed SLE diagnosis with positive double-stranded DNA antibodies and  positive ANA antibodies.  It seems that given this patient's  longstanding history of system lupus  erythematosus, it would not be  impossible, besides the fact that the patient is having probable ATN  from contrast nephropathy, that she may also have underlying lupus  nephritis.  After this reasoning on April 12, 2007, the patient has  received 3 days of pulse-dose steroids.  By the time of my dictation on  April 16, 2007, the patient's creatinine has decreased to 3.8.  The  patient has continued to be monitored by the [facility]  on a daily basis.   Problem 3.  SEVERE METABOLIC ACIDOSIS SECONDARY TO RENAL FAILURE AND  DIARRHEA:  This has been corrected on a daily basis and currently the  patient's bicarbonate level measures at 24.   Problem 4.  HISTORY OF CEREBRAL LUPUS:  The patient is to continue full-  time supervision.  She is clearly incompetent to make any decisions.  She has a guardian appointed by the [facility] [employer]  and she will go to a skilled facility upon discharge.      Edythe Lynn,  M.D.  Electronically Signed     SL/MEDQ  D:  04/16/2007  T:  04/17/2007  Job:  [id number]   cc:   Nat Christen, M.D.  [facility]

## 2010-07-30 NOTE — Consult Note (Signed)
NAMEANALYSE, FRUEHAUF             ACCOUNT NO.:  [id number]   MEDICAL RECORD NO.:  [id number]          PATIENT TYPE:  INP   LOCATION:  [id number]                          FACILITY:  [facility]   PHYSICIAN:  Alison Murray, M.D.DATE OF BIRTH:  [birth year]   DATE OF CONSULTATION:  DATE OF DISCHARGE:                                 CONSULTATION   REASON FOR CONSULT:  Worsening of renal failure.   Ms. Lawler is a 35 year old African American who was seen by me in  August because of anasarca.  The patient with history of lupus being  followed up  in [facility].  Presently was brought because of altered  mental status, severe anemia, and blood work was done.  She was found to  have hyperkalemia and worsening of renal failure.  Hence, a consult is  called.  The patient at this moment seems to be somewhat alert, in no  apparent distress.  She does not communicate a lot.  She denies any  nausea or vomiting.   PAST MEDICAL HISTORY:  As stated above, the patient with:  1. History of lupus nephritis.  2. History of chronic renal failure.  3. History of hypercalcemia.  4. History of depression.  5. History of malnutrition.  6. History of hypertension.  7. History of GERD.  8. History of severe hypoalbuminemia.  9. History of deconditioning.  10.History of anemia.   MEDICATIONS:  Consist of:  1. Norvasc 10 mg p.o. daily.  2. Os-Cal 500 mg p.o. t.i.d.  3. __________ p.o. t.i.d.  4. Lasix 120 mg IV q.12 h.  5. Neurontin 100 mg p.o. daily.  6. She is on Plaquenil 200 mg p.o. daily.  7. Megace 400 mg p.o. b.i.d.  8. Lopressor 100 mg p.o. b.i.d.  9. Remeron 15 mg p.o. at bedtime.  10.Vitamin 1 tablet p.o. daily.  11.Protonix 40 mg p.o. daily.  12.She is on Zoloft 175 mg p.o. daily.  13.She is getting IV fluid at 100 mL per hour.  14.She is also on Demadex 60 mg p.o. daily.   ALLERGIES:  No known allergies.   SOCIAL HISTORY:  She is a nursing home resident.   FAMILY HISTORY:  No history of  renal failure.   REVIEW OF SYSTEMS:  At this moment, the patient does not have any  complaint.  She does not have any nausea or vomiting.  She shook her  head for shortness of breath, saying no.  No chest pain.  No urgency  or frequency.   PHYSICAL EXAMINATION:  GENERAL:  As stated above, the patient is alert.  Family members are with her.  Her face seems to be somewhat puffy.  VITAL SIGNS:  Temperature is 97.7, heart rate of 86, and blood pressure  is 142/100.  HEENT:  No conjunctival pallor.  No icterus.  CHEST:  Decreased breath sounds, otherwise, seems to be clear.  No  rales.  No rhonchi.  No egophony.  HEART:  Regular rate and rhythm.  No murmur.  ABDOMEN:  Soft, positive bowel sounds.  EXTREMITIES:  She has 3+ edema, but looks better than the last  time she  was here.  Her weight also has gone down to 69 kilos, previously it was  as high as 70.   Her white blood cell count is 18.9, hemoglobin 12.4, hematocrit is 46.1.  Her sodium is 140, potassium is 6, CO2 of 14, BUN is 116, creatinine is  4.5.  Her creatinine has been as low as 1.9 and 2.   ASSESSMENT:  1. Renal insufficiency, acute-on-chronic, probably related to her      fluid removal, however, since the patient has underlying chronic      renal failure and anasarca.  This also could be secondary to fluid      removal.  Presently, she does not have any uremic signs and      symptoms.  2. Hyperkalemia.  Potassium 6.  3. Low CO2 of 14, possibly metabolic acidosis.  4. History of lupus.  5. History of hypercalcemia.  Calcium seems to be normal.  But at this      moment, we do not have albumin.  6. History of anasarca, significant, possibly from her severe      malnutrition.  7. History of deconditioning, the patient is bedridden.   RECOMMENDATIONS:  We will do an ABG.  We will change her IV fluid to  half normal saline with sodium bicarbonate.  Since she is on Lasix, we  will discontinue Demadex.  We will check her  BMET and we will check also  her UA.  Since she has previous workup, we would just continue managing  her.  If her renal function does not improve, probably the patient may  require hemodialysis.      Alison Murray, M.D.  Electronically Signed     BB/MEDQ  D:  12/17/2007  T:  12/18/2007  Job:  [id number]

## 2010-07-30 NOTE — Group Therapy Note (Signed)
[name], [name]             ACCOUNT NO.:  [id number]   MEDICAL RECORD NO.:  [id number]          PATIENT TYPE:  INP   LOCATION:  IC03                          FACILITY:  [facility]   PHYSICIAN:  Kofi A. Merlene Laughter, M.D. DATE OF BIRTH:  1976/01/04   DATE OF PROCEDURE:  DATE OF DISCHARGE:                                 PROGRESS NOTE   The patient has not had any seizures.  She is on Diprivan, but this has  been weaned.  She will be undergoing a trial of weaning off the  ventilator today.  Temperature 98.6, pulse 100.  The patient is examined  on propofol.  She does only rise to the light sternal rub.  She actually  continues to have improved alertness.  She looks around, but does not  follow commands.  She continues to have anisocoria with the left pupil  being 5 mm and the right 4, both are reactive.  She does have corneal  and gag reflexes and full extraocular movements.   Sodium 137, potassium 3.9, chloride 103, CO2 23, BUN 24, creatinine 2.4,  glucose 227.  WBC 11, hemoglobin 9, platelet count of 158.   ASSESSMENT AND PLAN:  1. New-onset seizures status post status epilepticus.  She has done      well on the Dilantin.  2. Acute encephalopathy due to acute illness and medication effect.  3. Systemic lupus erythematosus.  She was given a bolus dose of      steroids.  So far, the patient has tolerated this.  She is supposed      to undergo a ventilation wean.  We will see how she does.      Kofi A. Merlene Laughter, M.D.  Electronically Signed     KAD/MEDQ  D:  12/24/2007  T:  12/24/2007  Job:  [id number]

## 2010-07-30 NOTE — Consult Note (Signed)
NAMEDENNY, [name]             ACCOUNT NO.:  [id number]   MEDICAL RECORD NO.:  [id number]          PATIENT TYPE:  INP   LOCATION:  A340                          FACILITY:  [facility]   PHYSICIAN:  Edward L. Luan Pulling, M.D.DATE OF BIRTH:  07-04-1975   DATE OF CONSULTATION:  DATE OF DISCHARGE:                                 CONSULTATION   REASON FOR CONSULTATION:  Pleural effusion.   HISTORY:  [name] is a 35 year old who has a very complicated  medical history and she has been in and out of the hospital and she was  brought to the ER from her dialysis unit.  She had been fairly recently  started on hemodialysis.  She had been hospitalized several times in the  past with problems with respiratory failure and renal failure.  During  dialysis on the day of admission, she complained of shortness of breath  and chest pain and she was brought to the emergency room and she was  found to have a very large pleural effusion on the left.  She had  leukocytosis.  She was found to have pneumonia and possible a large  pleural effusion.   Her past medical history shows that she has been quite sick for the last  several months.  She has had ventilator-dependent respiratory failure,  previous episodes of sepsis, urinary tract infection, chronic renal  failure on hemodialysis, systemic lupus erythematosus, seizure disorder,  protein-calorie malnutrition, pericardial effusion, history of  pneumonia, history of cardiac arrhythmias, and history of multiple  electrolyte abnormalities.   SOCIAL HISTORY:  She has been a resident at the [facility]  since she has gotten so sick.  She does not use alcohol, tobacco, or  illicit drugs.   Exam shows that she is awake she is alert.  She does she looks  chronically ill.  Her temperature 98.7, pulse 135, respirations 18,  blood pressure 110/58.  Her pupils are reactive.  Her mucous membranes  are moist.  Her nose and throat are clear.  Her chest shows  decreased  breath sounds on the left.  Her heart is regular.  I do not hear a  gallop.  Her abdomen is soft without masses.  Extremities showed edema,  1+ or so.   Her laboratory work on admission; her white blood count was 24,200,  hemoglobin is 10.8.  Electrolytes were normal.  Chest x-ray showed a  very large hemithorax.  She has a very large pleural effusion.  She has  had a thoracentesis, but there is not been a great deal of change in  this fusion.  She is going to get a PICC line today.  The information  from the thoracentesis fluid that we have available so far is at the  protein is 3.1, cell count 1120, 52% segmented forms, some lymphocytes.  Culture thus far okay.   ASSESSMENT:  She has a very large pleural effusion that is accumulated  rapidly.  I agree with Dr. Legrand Rams that we need to have her get another  thoracentesis, but she is having dialysis and a PICC line today, we may  not be able to get it done until tomorrow.  Otherwise, I would continue  with her treatments.  Her effusion may be better after the dialysis.  She may require chest tube drainage if we cannot get this to drain and  stay down.      Edward L. Luan Pulling, M.D.  Electronically Signed     ELH/MEDQ  D:  02/03/2008  T:  02/04/2008  Job:  [id number]

## 2010-07-30 NOTE — Group Therapy Note (Signed)
NAMEEMANII, KANNING             ACCOUNT NO.:  [id number]   MEDICAL RECORD NO.:  [id number]          PATIENT TYPE:  INP   LOCATION:  IC03                          FACILITY:  [facility]   PHYSICIAN:  Edward L. Luan Pulling, M.D.DATE OF BIRTH:  02/05/1976   DATE OF PROCEDURE:  DATE OF DISCHARGE:                                 PROGRESS NOTE   Ms. Pitzer is a patient of Dr. Legrand Rams.  She is in the intensive care  unit with what appeared to be sepsis.  She is much improved.   Her blood pressure today is 110/75, pulse is 104, her temperature is  37.7, her weight 50.9, INO -864 yesterday, +565 so far today.  She has  multiple medical problems including lupus erythematosus, which is  systemic, and she has multiple complications of it.  She has what  appears to be sepsis.  She has malnutrition.  She has chronic renal  failure, on dialysis.   Her laboratory work this morning shows her white blood count is 20,900,  hemoglobin is 8.8, albumin is 1, and creatinine 1.82.  She did have  dialysis yesterday.  She had a thoracentesis yesterday with 830 mL of  fluid removed.  This morning, I think the biggest problem now is that  her albumin level is so low.  I do not think she is going to be able to  maintain her nutritional status by mouth, and I think, we ought to go  ahead and try to get a feeding tube and then see if we can make any  difference with that.  Otherwise, she does seem to be doing generally  better.  Urine culture, which is a cath specimen grew 15,000 of multiple  colonies, so she is not totally clear what is the source for fever and  her sepsis syndrome is that she is much better.  I do not plan to change  anything else at this point, we will see what she does with all these  treatments.      Edward L. Luan Pulling, M.D.  Electronically Signed     ELH/MEDQ  D:  02/12/2008  T:  02/12/2008  Job:  [id number]

## 2010-07-30 NOTE — Consult Note (Signed)
[name], [name]             ACCOUNT NO.:  [id number]   MEDICAL RECORD NO.:  [id number]          PATIENT TYPE:  INP   LOCATION:  IC06                          FACILITY:  [facility]   PHYSICIAN:  Edward L. Luan Pulling, M.D.DATE OF BIRTH:  05-30-1975   DATE OF CONSULTATION:  DATE OF DISCHARGE:                                 CONSULTATION   Patient of Dr. Legrand Rams.   [name] is a 35 year old who has a complicated medical history with  renal failure on the basis of systemic lupus erythematosus.  She had  been discharged from the hospital on December 28, 2007, and had done well  initially, but then developed increasing problems with shortness of  breath, etc., eventually he was brought back to the emergency room, was  in respiratory distress, intubated.  Chest x-ray showed a left basilar  air space opacity suspicious for pneumonia, but also she has had  significant fluid.   Her past medical history is positive for renal failure.  She has been on  dialysis fairly briefly.  She has anemia related to chronic disease.  She had anasarca related to her renal failure.  She has SLE.  She has a  seizure disorder.  She has had a recent episode of ventilator-dependent  respiratory failure.  She has protein-calorie malnutrition,  deconditioning, anxiety, depression, and hyperglycemia that is thought  to be related to steroids.   MEDICATIONS:  1. Atrovent.  2. Proventil q.6 h.  3. Epogen 10,000 units which she gets in dialysis.  4. Norvasc 10 mg daily.  5. Gabapentin 100 mg daily.  6. Calcium carbonate 500 mg every 8 hours.  7. Plaquenil 200 mg daily.  8. Megace 400 mg b.i.d.  9. Lopressor 100 mg b.i.d.  10.Remeron 50 mg at night.  11.Multiple vitamin with minerals daily.  12.Niacin 500 mg at bedtime.  13.Protonix 40 mg daily.  14.Dilantin 100 mg t.i.d.  15.MiraLax 17 g daily.  16.Zoloft 175 mg daily,  I am not sure about that dose.  17.Renagel 800 mg with meals.  18.Diflucan 100 mg daily.  19.Dulcolax.   This list is from her history and physical.  She is unable to given me  any of this history now.   SOCIAL HISTORY:  She is a resident of a nursing home.  She does not use  alcohol, tobacco, or illicit drugs.   Her review of systems is unobtainable.   PHYSICAL EXAMINATION:  VITAL SIGNS:  Heart rate is 135, blood pressure  in the 123456 systolic.  HEENT:  Her pupils are reactive.  She does open her eyes.  NECK:  Supple without masses.  CHEST:  Clear without wheezes.  She does have some rhonchi.  HEART:  Regular without gallop.  ABDOMEN:  Soft without masses.  EXTREMITIES:  No edema.  She apparently had a midline catheter and I was  concerned that it was perhaps infected when she came in, it has been  removed.  She now has poor venous access.  She has already been dialyzed  since she has been here.   Blood gas done this morning shows on 60%  550 rate of 14, 5 of PEEP, pO2  is 134, pCO2 of 35, pH 7.49.  Her CBC shows white count 34,900,  hemoglobin is 12.1, platelets 315.  Her BMET, potassium is 2.2.  CBC,  white count 24,900 later white count with a hemoglobin of 9.  Chest x-  ray shows bilateral infiltrates, some question of pneumonia on the left.   ASSESSMENT:  She is obviously critically ill.  She has ventilator-  dependent respiratory failure again.  She has had volume overload.  She  has chronic renal failure.  She has been dialyzed.  She is hypokalemic.  We have poor venous access, and she may have a blood stream infection  from an infected IV.   PLAN:  Plan then is to see if we can get Dr. Arnoldo Morale to insert a central  catheter.  We are going to continue with antibiotics, etc.  She is  currently on Diflucan, Zosyn, vancomycin, and I think I would probably  add Avelox.      Edward L. Luan Pulling, M.D.  Electronically Signed     ELH/MEDQ  D:  01/01/2008  T:  01/01/2008  Job:  [id number]

## 2010-07-30 NOTE — Consult Note (Signed)
[name], [name]             ACCOUNT NO.:  [id number]   MEDICAL RECORD NO.:  [id number]          PATIENT TYPE:  INP   LOCATION:  5531                         FACILITY:  [facility]   PHYSICIAN:  Caren Griffins B. Lorrene Reid, M.D.DATE OF BIRTH:  1976-01-22   DATE OF CONSULTATION:  DATE OF DISCHARGE:                                 CONSULTATION   INTERIM CONSULTATION NOTE   This is a nephrology consultation.   HISTORY:  This is an interim update consult note on [name], who  is a 35 year old African-American female who has a past medical history  significant for longstanding lupus, who we are following in the hospital  for acute renal insufficiency.  She had a creatinine of 1.59 on January  16, 1.76 on January 17, and 2.18 on January 18, and had been receiving  lisinopril prior to her hospital admission, received IV contrast for a  CT scan of the head and neck April 02, 2007, and was volume depleted.  Her creatinine prior to hospital admission was 0.65.   Our initial working diagnosis was that of multifactorial acute kidney  injury, secondary to volume contraction, ACE, and contrast.  She has  received IV fluid hydration, IV sodium bicarbonate for metabolic  acidosis and replacement of potassium because of hypokalemia.   Her creatinine has continued to rise and today has reached max of 4.93  with a BUN of 29.   I have reviewed extensive E chart records.  I believe she probably has  underlying lupus nephritis.  In November 2006, I found a  24-hour urine  that showed 4.6 grams of protein in a 24-hour collection.  She states  that she has never seen a nephrologist, prior to this hospital admission  and has never had a renal biopsy.   Her urine protein-creatinine ratio at the current time is 3.22 grams of  protein per gram of creatinine, and she has significant  hypocomplementemia with a C3 of 28 and C4 of 5.  Her AMA is only weakly  positive at a level of 1 to 80.   She remains  not remains non-oliguric, and her creatinine of 4.93 today  is up from 4.24 yesterday, but is a slower rate of rise.   IMPRESSION:  1. Non-oliguric ATN - Still believe that there is probably a      multifactorial etiology here, with prior ACE inhibitor volume      depletion which may be ongoing to some degree because of diarrhea      and IV contrast exposure.  The rate of rise of creatinine appears      to be slowing, so hopefully the acute issue will improve.  Will      plan to replace her potassium, continue her IV fluids, and continue      to watch expectantly.  She has no dialysis indications.  2. She has probable underlying lupus nephritis of some duration, with      nephrotic range proteinuria as far back as 2006 with no prior renal      biopsy that she is aware of.  Even if her renal  function improves      baseline, with this      current acute kidney injury she will still require renal biopsy at      some point, but this could be postponed to a later time, after her      infectious issues are resolved.   We will continue to follow her closely in the hospital.           ______________________________  Elzie Rings. Lorrene Reid, M.D.     CBD/MEDQ  D:  04/10/2007  T:  04/10/2007  Job:  [id number]

## 2010-07-30 NOTE — Discharge Summary (Signed)
[name], [name]             ACCOUNT NO.:  [id number]   MEDICAL RECORD NO.:  [id number]          PATIENT TYPE:  INP   LOCATION:  A303                          FACILITY:  [facility]   PHYSICIAN:  Tesfaye D. Legrand Rams, MD   DATE OF BIRTH:  [birth year]   DATE OF ADMISSION:  12/16/2007  DATE OF DISCHARGE:  10/06/2009LH                               DISCHARGE SUMMARY   DISCHARGE DIAGNOSES:  1. Bleeding from peripherally inserted central catheter line.  2. Anemia secondary to the above.  3. Renal failure secondary to lupus nephritis.  4. Anasarca.  5. Systemic lupus  erythematosus.  6. Seizure disorder.  7. Status post ventilatory dependent respiratory failure.  8. Severe protein calorie malnutrition.  9. Anxiety depression disorder.  10.Severe deconditioning.  11.Yeast in urine.  12.Steroid induced hyperglycemia.   DISCHARGE MEDICATIONS:  1. Atrovent and Proventil nebulizer q.6 h p.r.n.  2. Epogen 10,000 units during hemodialysis.  3. Norvasc 10 mg daily.  4. Gabapentin 100 mg daily.  5. Calcium carbonate 500 mg q.8 h.  6. Plaquenil  200 mg p.o. on daily.  7. Megace 400 mg b.i.d.  8. Lopressor 100 mg b.i.d.  9. Remeron 15 mg nightly.  10.Multivitamin with minerals 1 tablet daily.  11.Niacin 500 mg nightly.  12.Protonix 40 mg daily.  13.Dilantin 100 mg p.o. t.i.d.  14.MiraLax 17 grams p.o. daily.  15.Zoloft 175 mg daily.  16.Renagel 800 mg p.o. q.8 h.  17.Diflucan 100 mg p.o. daily for 5 more days.  18.Dulcolax suppository 10 mg daily as needed.   DISPOSITION:  The patient will be discharged to [facility] to  continue her medical care.   HOSPITAL COURSE:  This is a 35 years old female patient with complicated  medical history who was in nursing home who was admitted through  emergency room due to bleeding from left PICC line.  On evaluation in  the emergency room, the patient had a hemoglobin of 6.9 with hematocrit  of 22.  The patient also had severe anasarca with  worsening renal  failure.  The patient was admitted and she was taped, crossmatched, and  transfused.  Her bleeding from PICC line was stopped.  The patient was  evaluated by a nephrologist and she was started on IV diuretics and IV  fluids.  However, her renal function did not improve significantly.  Decision was made to start the patient on hemodialysis.  While the  patient being prepared for hemodialysis, she had an episode of recurrent  seizure disorder and developed respiratory distress.  The patient was  immediately moved to ICU and she was intubated.  Pulmonary and Neurology  consult was done.  The patient was supported on ventilatory support.  She was started on IV Dilantin followed by oral Dilantin.  Her seizure  was controlled.  Over the few days, the patient was able to be  successfully extubated.  She was continued on hemodialysis.  The  patient's overall condition continued to improve.  Her edema is much  improved at this time.  Her oral fluid intake also has improved.  The  patient will be  discharged back to nursing home to continue her current  medications and follow up with dialysis unit for maintenance dialysis.      Tesfaye D. Legrand Rams, MD  Electronically Signed     TDF/MEDQ  D:  12/28/2007  T:  12/28/2007  Job:  [id number]

## 2010-07-30 NOTE — Assessment & Plan Note (Signed)
[name], [name]              CHART#:  [id number]   DATE:  08/25/2007                       DOB:  02-06-1976   REQUESTING PHYSICIAN:  Donato Heinz, MD.   PRIMARY CARE PHYSICIAN:  Tesfaye D. Fanta, MD.   CHIEF COMPLAINT:  Failure to thrive, chronic vomiting, anorexia, and  weight loss.   HISTORY OF PRESENT ILLNESS:  The patient is a 35 year old African  American female.  She is a resident of [facility].  She was  seen by Neil Crouch, PA-C, Dr. Gala Romney, and Dr. Stann Mainland while hospitalized  back in April 2008 for similar complaints and for consideration of PEG  tube.  Basically at that time, she had lost a significant amount of  weight and was refusing to eat.  She did have an element of depression  as well as a severe SLE, and she is mentally challenged.  She has been  on Megace for about 2 weeks.  I did get most of this report from a  combination of her nurse, Magda Paganini, at [facility], and her sister, Henderson Cloud,  with whom she used to reside.  Magda Paganini, her nurse, tells me over the last  month, she pretty much refuses to eat anything, she stays in her bed all  day and just lies there.  She is usually only getting up when assisted  by physical therapy.  She has become quite weak and withdrawn.  She  tells me that typically she will not eat her meals; however, if she does  eat her meals, she will either regurgitate or vomit within a few minutes  after eating.  She denies any abdominal pain.  She generally has regular  soft brown bowel movements on a daily basis.  She has previously been  treated with MiraLax; however, her nurse tells me she has refused this  recently.  There is no known history of rectal bleeding or melena.  Her  sister, Henderson Cloud, tells me that her only complaints are pain in her  left foot.  She has noticed her becoming weaker by the day.  She usually  goes and visits her everyday at the nursing home.  She also takes care  of her children.  She tells me she  has not been sexually active since  diagnosed with lupus about 3 years ago.  She has recently been treated  with Zoloft and the belief is that the dose has been increased recently.   The patient notes that she does eat 3 times per day; however, this is  not confirmed by nursing staff.  She tells me she likes tacos and  [place] food, and also tells me that she uses her food to feed her son  who resides at her aunt's house.   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  An abdominal  ultrasound last year showed hepatic lesions.  It was suggested that she  have a followup CT with hemangioma protocol; however, this was not done,  at least not per our records.  She had an EGD by Dr. Stann Mainland on July 01, 2006, which was completely normal.  CURRENT MEDICATIONS:  1. Omeprazole 20 mg daily.  2. Calcitriol 0.5 mcg daily.  3. Lasix 40 mg daily.  4. Niaspan 500 mg daily.  5. MiraLax 17 grams daily.  6. Multivitamin daily.  7. Metoprolol 100 mg b.i.d.  8. Sodium bicarb 650 mg t.i.d.  9. Amlodipine 10 mg daily.  10.Dulcolax suppositories p.r.n.  11.Zofran p.r.n. injections or tablets.  12.Phenergan p.r.n.  13.Tylenol 325 mg 2 q.4 h. p.r.n.  14.Oxycodone 5 mg 2 q.4 h. p.r.n. pain.  15.Zoloft 100 mg daily.  16.Megace 100 mg b.i.d.   ALLERGIES:  No known drug allergies.   FAMILY HISTORY:  There is no family history of colorectal carcinoma or  other chronic GI problems.   SOCIAL HISTORY:  The patient resides at [facility].  She does  not have power of attorney, but is a ward of the state.  Her mother is  living in [place].  She does not have contact with the patient on a  regular basis.   She previously resided with her aunt.  Her aunt is  taking care of her 3 children.  She has history of tobacco use, quit in  2006, 15-pack year history.  Denies any alcohol or drug use.   REVIEW OF SYSTEMS:  See HPI.  General:  She does have profound weakness,  fatigue, unable to get out of the bed most days.  Otherwise, negative  review of systems.   PHYSICAL EXAMINATION:  VITAL SIGNS:  Weight unable to obtain as she  could not stand upright on our scales, temperature 97.8, blood pressure  98/60, and pulse 72.  GENERAL:  The patient is a very thin, cachectic African American female  who is alert.  She is oriented to herself only.  HEENT:  Sclerae, clear and nonicteric.  Conjunctivae, pink.  Oropharynx,  pink and moist without any lesions.  NECK:  Supple without mass or thyromegaly.  CHEST:  Heart regular rate and rhythm.  Normal S1 and S2.  No murmurs,  clicks, rubs, or gallops.  LUNGS:  Clear to auscultation bilaterally.  ABDOMEN:  Positive bowel sounds x4.  No bruits auscultated.  Soft,  nontender, and nondistended without palpable mass or hepatosplenomegaly.  No rebound tenderness or guarding.  EXTREMITIES:  Without clubbing or edema bilaterally.   LABORATORY DATA:  Laboratory studies from Jul 22, 2007, showed white  blood cell count of 5.1, hemoglobin 13.7, hematocrit 41.2, and platelet  count 197.  She had an iron of 57, UIBC 79, TIBC 136 which is low,  percent saturation 42.  On August 19, 2007, she had an MCV of 73.6; she had  a creatinine of 1.67, and an albumin of 2.2, and otherwise normal renal  profile.  Ferritin was 521.  On Jul 27, 2007, she had an abdominal  ultrasound at [facility], which showed chronic renal insufficiency, liver  parenchyma was normal and uniform without hepatic mass, cyst, or biliary  ductal dilatation.  CBD was 3.5 mm.  Pancreas was grossly normal.   IMPRESSION:  The patient is a 35 year old African American female with  systemic lupus erythematosus and  chronic kidney disease who is mentally  challenged, who has had chronic anorexia, significant weight loss,  vomiting, and progressive weakness.  This case has been discussed with  Dr. Gala Romney as well.  Etiology of her above symptoms could be  multifactorial.  It is possible that it could be related to her  depression and mental illness, chronic kidney disease, complications  from systemic  lupus erythematosus including vasculitis and vasculitic  gastroparesis.  We also need to rule out obstruction and peptic ulcer  disease.  Central etiology should be ruled out as well.  Her situation  is quite complicated.   PLAN:  1. Begin with upper GI series.  If this is normal, we would proceed      with gastric emptying study.  2. Check LFTs.   Thank you Dr. Marval Regal for asking [place] to participate in the care of  this patient.       Vickey Huger, N.P.  Electronically Signed     R. Garfield Cornea, M.D.  Electronically Signed    KJ/MEDQ  D:  08/25/2007  T:  08/26/2007  Job:  [id number]   cc:   Donato Heinz, M.D.  Tesfaye D. Legrand Rams, MD

## 2010-07-30 NOTE — Consult Note (Signed)
Erica, [name]:  [id number]   MEDICAL RECORD NO.:  [id number]          PATIENT TYPE:  INP   LOCATION:  [id number]                          FACILITY:  [facility]   PHYSICIAN:  Satira Sark, MD DATE OF BIRTH:  17-Feb-1976   DATE OF CONSULTATION:  01/11/2008  DATE OF DISCHARGE:                                 CONSULTATION   REASON FOR CONSULTATION:  Tachycardia.   HISTORY OF PRESENT ILLNESS:  [name] is an unfortunate 35 year old  woman with multiple comorbid illnesses, recently residing and a nursing  facility.  She was admitted to the hospital on October 18 fever and  chills, having just recently been discharged back to her nursing home.  She has a history systemic lupus erythematosus as well as nephritis with  subsequent end-stage renal disease requiring hemodialysis, chronic  anemia, previously documented seizure disorder, severe protein calorie  malnutrition with anasarca, deconditioning, anxiety and depression, and  hyperglycemia related to steroid use.  She has had a recent episode  ventilator dependent respiratory failure related to general volume  overload and is presently being managed for presumed sepsis with  continued fevers.  She most recently had temperature up to 100.8 and has  had placement of a PICC line following removal of a previously placed  femoral central venous line.  She is being treated with Diflucan, Zosyn  and vancomycin at this time, and I see blood cultures from October 16  that are negative.  She did have greater than 100,000 colonies of  Klebsiella pneumonia in her urine at that time.  Previous tracheal  aspirate from October 19 was also negative.  I see a femoral catheter  tip culture from October 24 that is presently growing only 20 colonies  of Klebsiella pneumonia.  Her stool is negative for Clostridium  difficile toxin.  [name] gives a fairly limited history.  She  complains of generally feeling achy and has  significant malaise.  Denies  any chest pain or sense of palpitations.  We were asked to comment on  her rapid heart rate.  She is not on telemetry at this time.  In  reviewing her electrocardiogram.  She has sinus tachycardia, typically  in the 120-130 range.  She has fairly diffuse nonspecific ST-T wave  changes and borderline low voltage in the one leads.  I see no other  clearly documented dysrhythmia, even based on review of previous  telemetry strips in the chart dating back to mid October.  In reviewing  her record, I see that Dr. Wilhemina Cash saw her in consultation back in 2006,  also in the setting of sinus tachycardia and other acute illness.  She  had an echocardiogram done in November 2006 demonstrating a left  ventricular ejection fraction of 55-60% with no pericardial effusion and  normal right ventricular systolic function.   ALLERGIES:  NO KNOWN DRUG ALLERGIES.   PRESENT MEDICATIONS:  1. Ventolin 2.5 mg inhaled q.6 h.  2. PhosLo 1334 mg p.o. t.i.d.  3. Os-Cal 500 mg p.o. t.i.d. with meals.  4. Cardizem CD 60 mg p.o. q.6 h.  5. Epogen  10,000 units intravenously on Monday, Wednesday and Friday.  6. Diflucan 100 mg p.o. daily.  7. Neurontin 100 mg p.o. daily.  8. Heparin 5000 units subcu q.8 h.  9. Plaquenil 200 mg p.o. daily.  10.Atrovent 0.5 mg inhaled q.6 h.  11.Megace 400 mg p.o. b.i.d.  12.Lopressor 150 mg p.o. b.i.d.  13.Remeron 15 mg p.o. q.h.s.  14.Avelox 400 mg IV q.24 h.  15.Multivitamin one p.o. daily.  16.Niacin 500 mg p.o. q.h.s.  17.Protonix 40 mg p.o. daily.  18.Dilantin 300 mg p.o. b.i.d.  19.Zosyn 2.25 grams IV q.6 h.  20.Zoloft 175 mg daily.  21.Renagel 800 mg p.o. t.i.d.  22.Zosyn protocol.  23.Ventolin 2.5 mg inhaler q.3 h p.r.n.   PAST MEDICAL HISTORY:  As outlined above.  There is no obvious history  of cardiovascular disease or myopathy.  No described history of  pericardial effusion.   SOCIAL HISTORY:  [name] resides in a nursing home  at this time.  There is no active tobacco or alcohol use history.   FAMILY HISTORY:  Reviewed and is noncontributory at present.   REVIEW OF SYSTEMS:  As detailed above.  She has a poor appetite.  No  cough or hemoptysis.  Has chronic lower extremity edema, history of  anasarca.  No recent chest pain.  Otherwise, negative.   PHYSICAL EXAMINATION:  VITAL SIGNS:  Most recent T-max is 100.8 degrees,  heart rate has been fairly consistent in the 120 range in sinus rhythm,  respirations have been 20s to 30s.  Systolic blood pressure generally in  the 130-150's, lower more recently, saturation 95% 2 liters nasal  cannula.  GENERAL:  This is a chronically ill appearing woman 61 kg, in no acute  distress at this time.  Recent output 2700 mL more than in the last 24  hours.  HEENT:  Conjunctivae is normal.  Pharynx clear.  NECK:  Supple.  Increased girth.  No obvious elevated jugular venous  pressure.  No thyromegaly.  LUNGS:  Exhibit diffusely diminished breath sounds.  No rails at present  cardiac exam reveals a regular tachycardia without obvious S3 gallop or  pericardial rub.  Probable systolic flow murmur noted.  ABDOMEN:  Protuberant.  No guarding is noted.  Bowel sounds are  diminished throughout.  EXTREMITIES:  Exhibit chronic-appearing edema with diminished pulses.  MUSCULOSKELETAL:  No kyphosis is noted.  NEUROPSYCHIATRIC:  Patient is  alert, oriented x3.  Affect is flat.   LABORATORY DATA:  WBC 13.6 down from 26.4, hemoglobin 9.2 down from  10.4, hematocrit 27.2 down from 31.0, platelets 249.  Sodium 139,  potassium 3.6, chloride 104, bicarb 28, glucose 85, BUN 17, creatinine  2.1, TSH 2.01.   Chest x-ray from October 23 demonstrated bibasilar air space disease and  a small right pleural effusion.  There is description of a left sided  PICC line in place with a tip located in the right atrium.  Suggestion  is given that the catheter should be drawn approximately 4 cm for  better  positioning.  No pneumothorax was described.   IMPRESSION/RECOMMENDATIONS:  1. Apparent sinus tachycardia based on review of present strips.  I do      not have an old tracing in normal sinus rhythm for comparison.  It      is likely that she has sinus tachycardia secondary to underlying      active illness.  Please note that the patient's chest x-ray      following left sided PICC line  placement described the tip in the      right atrium with suggestion to move this catheter back 4 cm for      better positioning.  If not already accomplished, this should be      done to lessen the chance of another atrial arrhythmia.  Ms.      Bielefeld is denying any active chest at this time, but she has an      echocardiogram pending.  2. Severe comorbid illness with as outlined above including systemic      lupus erythematosus, previous nephritis, end-stage renal disease on      hemodialysis, apparent sepsis with documentation of urinary tract      infection and some minor growth of Klebsiella from recently removed      left femoral catheter.  She is on broad-spectrum antibiotics at      this time and did have a low grade fever yesterday.  She is being      given fluids for blood pressure stabilization as of this morning.      She continues to manifest a leukocytosis as well.   RECOMMENDATIONS:  Will plan to follow up on echocardiogram already  ordered to evaluate for sepsis related cardiomyopathy, pericardial  effusion, and exclude vegetations.  If heart rates fluctuate, it would  be worth considering placing Ms. Velis back on telemetry for more  clear documentation of her rhythm, specifically to include any rhythm  changes.  At this point, she appears to be in sinus tachycardia.  Would  suggest moving her left sided PICC catheter back 4 cm as recommended and  her chest x-ray report from October 23, if not already done.  This would  certainly lessen the chance of any other atrial  arrhythmias as well.  We  will plan to follow with you.      Satira Sark, MD  Electronically Signed     SGM/MEDQ  D:  01/11/2008  T:  01/11/2008  Job:  [id number]

## 2010-07-30 NOTE — Consult Note (Signed)
[name], AMORIM NO.:  [id number]   MEDICAL RECORD NO.:  [id number]          PATIENT TYPE:  INP   LOCATION:  2917                         FACILITY:  [facility]   PHYSICIAN:  Donato Heinz, M.D.DATE OF BIRTH:  03/10/1976   DATE OF CONSULTATION:  04/04/2007  DATE OF DISCHARGE:                                 CONSULTATION   REASON FOR CONSULTATION:  Acute renal failure.   HISTORY OF PRESENT ILLNESS:  Ms. Boodoo is a 35 year old African  American female with a past medical history significant for systemic  lupus erythematosus who presented to [facility] emergency room on  April 02, 2007, for increasing head and neck swelling.  She was found  to have extensive cellulitis and was admitted for IV antibiotics.  We  were asked to see the patient to evaluate rising serum creatinine since  her time of admission.  The trend in her creatinine is as follows:  Her  last known serum creatinine in October 2008 was 0.65.  On January 16 it  was 1.59; on January 17 it was 1.76; and today, January 18, it was 2.18.   Of note, the patient was on lisinopril 20 mg a day until time of  admission and had undergone a CT scan with IV contrast on April 02, 2007, to further evaluate her cellulitis and rule out abscess, and she  received 100 mL of contrast at that time.  She has no known drug  allergies.   PAST MEDICAL HISTORY:  1. Systemic lupus erythematosus with chronic encephalopathy.  2. Seizure disorder.  3. Anemia.  4. Mild mental retardation.  5. Hypertension.   OUTPATIENT MEDICATIONS:  1. Neurontin 100 mg at bedtime.  2. Paxil 20 mg daily.  3. Lisinopril 20 mg daily.  4. Multivitamin one a day.  5. Folic acid once a day.   INPATIENT MEDICATIONS:  Protonix IV, Zosyn IV, vancomycin IV, Tylenol  p.r.n., Dilaudid p.r.n., metoprolol IV, Zofran p.r.n.  She has not been  receiving her ACE inhibitor.   FAMILY HISTORY:  Is difficult to obtain due to the patient's poor  ability to convey information, but no family history of kidney disease.   SOCIAL HISTORY:  She lives with her aunt in [place].  Denies alcohol  or drug use but does smoke one cigarette a day.   REVIEW OF SYSTEMS:  GENERAL:  She has had malaise, fatigue.  HEENT:  She  has noticed increasing edema and pain in her face and neck prior to  admission.  CARDIAC:  No chest pain, palpitations, orthopnea.  PULMONARY:  No shortness of breath, hemoptysis, productive cough.  GI:  Has had some nausea.  No vomiting, hematochezia, melena, or bright red  blood per rectum.  GU:  No dysuria, pyuria, hematuria, urgency,  frequency or retention.  RHEUMATOLOGIC:  No arthralgias or myalgias.  DERMATOLOGIC:  She has had a rash around her neck prior to admission.  All other systems negative.   PHYSICAL EXAMINATION:  This is a frail, cachectic, chronically-ill-  appearing female in no apparent distress.  Temperature is 99.2, pulse  117, blood pressure 159/109,  respiratory rate 20.  HEENT:  She has some patches of alopecia and some discoid changes on her  scalp.  She has submandibular edema and erythema and some  hyperpigmentation around her neck with some lymphadenopathy that is  tender to touch.  Also some small pustules along the same area.  LUNGS:  Clear to auscultation and percussion.  No rales or rhonchi.  CARDIAC EXAM:  Tachycardic.  No precordial rub appreciated.  ABDOMINAL EXAM:  Normal active bowel sounds.  Soft, nontender.  EXTREMITIES:  No edema, 1+ pedal pulses bilaterally.   LABORATORIES:  Sodium 145, potassium 3.1, chloride 120, CO2 16, BUN 24,  creatinine 2.18, glucose 136, calcium 7.3, albumin 1.8, magnesium 1.4.  White blood cell count 28.8, hemoglobin 10.7, platelets 242.  Urinalysis  showed no red blood cells, no white blood cells on microscopic; she did  have proteinuria.   ASSESSMENT AND PLAN:  1. Acute renal failure.  This is likely multifactorial due to volume      contraction, ACE  inhibitor and contrast nephropathy.  Continue to      hold ACE inhibitor.  Continue with IV fluids and follow creatinine      and urine output.  Try to renal dose all medications and limit      nephrotoxic agents.  2. Metabolic acidosis secondary to acute renal failure.  Will change      IV fluids to include bicarb.  3. Hypernatremia.  The patient is n.p.o. for now given her extensive      head and neck swelling and edema.  We will change her IV fluids to      D5W with only two ampules of bicarbonate so that she receives      hypotonic fluids and follow her serum sodium levels.  Once she is      able to take p.o., will adjust her IV fluids that time.  4. Hypokalemia.  Agree with runs of potassium chloride but would not      add it in her bicarbonate drip in light of her renal failure.  Will      continue to replace her volume for now.  5. Hypertension.  Okay for IV labetalol or Lopressor per the primary      service.  No ACE or ARB.  If she is able to take p.o.'s would add      amlodipine 10 mg a day, but avoid ACE and ARB in the setting of      renal failure.  6. Cellulitis.  Renal dose antibiotics.  7. Anemia.  She is stable and follow.  8. Lupus, stable.   Thank you for this consultation.  Will continue to follow along with  you.           ______________________________  Donato Heinz, M.D.     JC/MEDQ  D:  04/04/2007  T:  04/04/2007  Job:  [id number]

## 2010-07-30 NOTE — Group Therapy Note (Signed)
NAMECHANEQUA, [name]             ACCOUNT NO.:  [id number]   MEDICAL RECORD NO.:  [id number]          PATIENT TYPE:  INP   LOCATION:  A340                          FACILITY:  [facility]   PHYSICIAN:  Edward L. Luan Pulling, M.D.DATE OF BIRTH:  07/26/1975   DATE OF PROCEDURE:  DATE OF DISCHARGE:                                 PROGRESS NOTE   Patient of Dr. Legrand Rams.  [name] had more fluid removed on Friday from  her left hemithorax and is doing a little bit better.  She has no other  new complaints.  She was dialyzed yesterday.   Her examination shows that she actually does look a little bit better.  Her temperature is 97.6, pulse 102, respirations 20, blood pressure  100/65, and O2 sats 100% on 2 L.  Her chest, I think, somewhat clear at  least.  Blood culture so far is negative.  Culture from the  thoracentesis fluid negative, so far have no staph, and then second  blood culture was positive for coag-negative staph, it is hard to say  what to make of that considering her clinical situation.   ASSESSMENT:  Overall, she seems a little better.   PLAN:  I have discussed her situation with Dr. Legrand Rams and the plan is to  go ahead and try to get perhaps another thoracentesis done tomorrow  depending on how she does.      Edward L. Luan Pulling, M.D.  Electronically Signed     ELH/MEDQ  D:  02/06/2008  T:  02/06/2008  Job:  [id number]

## 2010-07-30 NOTE — Discharge Summary (Signed)
[name], [name]             ACCOUNT NO.:  [id number]   MEDICAL RECORD NO.:  [id number]          PATIENT TYPE:  INP   LOCATION:  A317                          FACILITY:  [facility]   PHYSICIAN:  Tesfaye D. Legrand Rams, MD   DATE OF BIRTH:  Jul 18, 1975   DATE OF ADMISSION:  10/28/2007  DATE OF DISCHARGE:  08/19/2009LH                               DISCHARGE SUMMARY   DISCHARGE DIAGNOSES:  1. Anasarca.  2. Chronic renal failure secondary to lupus nephritis.  3. Severe protein calorie malnutrition.  4. Anxiety/depression disorder.  5. Anemia with significant drop in hemoglobin and hematocrit requiring      transfusion.  6. History of hypercalcemia.  7. Systemic lupus erythematosus.   DISCHARGE MEDICATIONS:  1. Metoprolol 100 mg p.o. b.i.d.  2. Megace 400 mg b.i.d.  3. Protonix 40 mg daily.  4. Niacin 500 mg p.o. nightly.  5. Zoloft 150 mg daily.  6. Ergocalciferol 50,000 units weekly.  7. MiraLax 17 gm daily.  8. Norvasc 10 mg daily.  9. Neurontin 100 mg daily.  10.__________200 mg daily.  11.Multivitamin with minerals 1 tablet daily.  12.Protein supplement b.i.d.  13.Calcium carbonate 500 mg t.i.d.  14.Remeron 15 mg at bedtime.  15.Zaroxolyn 5 mg daily.  16.Demadex 60 mg p.o. b.i.d.  17.Ensure 240 mL p.o. t.i.d. with meals.  18.Bisacodyl suppository 15 mg p.r.n.  19.Oxycodone 5 mg p.o. q.6 h., p.r.n.  20.Anusol HC ointment b.i.d.   DISPOSITION:  The patient will be transferred back to a nursing home.   HOSPITAL COURSE:  This is a 35 year old female patient who has a history  of multiple medical illnesses, including severe chronic renal failure  secondary to lupus nephritis with anasarca, who was admitted from a  nursing home due to a drop in hemoglobin and hematocrit and generalized  edema.  The patient was admitted and was started on IV diuretics.  A  nephrology consult was done and her medications were adjusted, and the  patient continued to diurese significantly.  Due to  her severe protein  calorie malnutrition, the patient remained edematous and chronically  sick looking.  She was transfused 2 units of packed red blood cells.  The patient is currently back to her baseline.  She will be discharged  back to a nursing home and will continue on a combination of high dose  diuretics.  We will continue to monitor her renal function and CBC.      Tesfaye D. Legrand Rams, MD  Electronically Signed     TDF/MEDQ  D:  11/03/2007  T:  11/03/2007  Job:  [id number]

## 2010-07-30 NOTE — Discharge Summary (Signed)
NAMECLOA, BERTZ             ACCOUNT NO.:  [id number]   MEDICAL RECORD NO.:  [id number]          PATIENT TYPE:  INP   LOCATION:  [id number]                          FACILITY:  [facility]   PHYSICIAN:  Anselmo Pickler, DO    DATE OF BIRTH:  07-13-1975   DATE OF ADMISSION:  06/25/2006  DATE OF DISCHARGE:  04/19/2008LH                               DISCHARGE SUMMARY   ADMISSION DIAGNOSES:  1. Dehydration with hypotension and tachycardia.  2. History of poor p.o. intake.  3. History of weight loss over 20 pounds in the last 1-2 months.  4. History of SLE.  5. History of chronic encephalopathy probably secondary to SLE.  6. History of seizure disorder but apparently no longer in Dilantin.  7. History of iron deficiency anemia.  8. History of possible mental retardation.   DISCHARGE DIAGNOSES:  Malnutrition and depression.   SERVICE:  The patient was admitted to the service on [place].   CONSULTATIONS:  Gastroenterology was consulted for PEG tube placement.   PROCEDURES:  On July 01, 2006, the patient had an EGD done.  Findings  were normal esophagus, normal stomach and normal duodenal bulb.   HISTORY AND PHYSICAL:  The pertinent findings upon admission to the ER  is that the patient is a 35 year old African American female who has  systemic lupus erythematosus who apparently had stopped eating for  several days and she was found to be while in the ED hypotensive at that  point in time.  Basically, she had a lot of social issues regarding her  family members, and because she had had poor nutrition and felt possibly  malnourished over the last couple of months, it was determined that may  she should be evaluated for PEG tube placement.  The patient was then  admitted for hydration and hypotension and it was planned to give her  fluids.  Also, have evaluated her by GI to see if she qualified for a  PEG tube placement to monitor her systemic lupus erythematous as well as  maintaining  her and monitoring her seizure activity.  She has a chronic  history of that.   HOSPITAL COURSE:  While the patient was here in the hospital, she was  seen by GI.  It was determined that after EGD that her results were  normal, and the patient also was followed by the dietician here to  determine caloric intake.  It was noted that she was not eating.  But it  was felt like her etiology of malnutrition was more psychiatric as  opposed to organic or anatomic.  At that point in time, it was  determined that social workers were working with her for placement of  her child as well as placement for herself.  It was deemed that the  family could not take care of her, and so it became more of an issue to  find a place where she could be independent.  While she was here, we  monitored her caloric intake.  When she was presented with the  possibility of a PEG tube, she began to eat more  heartily and was more  responsive to staff.  Also, PT/OT was consulted to get patient moving  and mobile.  Also, we had the ACT Team to come and evaluate her to help  facilitate placement as well.  It was determined through the work with  the social workers that Ms. Smithee then would be placed in a facility  and able to have some type of resolution with her child which was also a  concern.   DISPOSITION:  She was discharged to home with [facility] [place]  evaluation.  Also, she would be examined by the [facility] for examination.  She was sent home.   DISCHARGE MEDICATIONS:  1. Folic acid 1 mg p.o. daily.  2. Multivitamin one tablet p.o. daily.  3. Lisinopril 20 mg p.o. daily.  4. Paxil 20 mg p.o. daily.  5. Prednisone 20 mg p.o. daily.  6. Ferrous sulfate 325 mg p.o. b.i.d.  7. Neurontin 300 mg p.o. q.h.s.   FOLLOWUP:  It was recommended that she follow up with the [facility] and also to follow up with Dr. Truett Mainland, or to return to the  emergency room if symptoms  worsen.      Anselmo Pickler, DO  Electronically Signed     CB/MEDQ  D:  08/21/2006  T:  08/21/2006  Job:  [id number]   cc:   Nat Christen, M.D.

## 2010-07-30 NOTE — Op Note (Signed)
[name], [name]             ACCOUNT NO.:  [id number]   MEDICAL RECORD NO.:  [id number]          PATIENT TYPE:  INP   LOCATION:  IC07                          FACILITY:  [facility]   PHYSICIAN:  R. Garfield Cornea, M.D. DATE OF BIRTH:  December 03, 1975   DATE OF PROCEDURE:  01/12/2008  DATE OF DISCHARGE:                               OPERATIVE REPORT   PROCEDURE:  EGD with bleeding control therapy.   INDICATIONS FOR PROCEDURE:  A 35 year old lady with numerous medical  problems consisting of SCLE with encephalopathy, end-stage renal disease  on hemodialysis and hospitalized for prolonged period of time with  respiratory failure and pneumonia.  She has now developed a torrential  GI bleed, gross blood per rectum, and a multi-unit hemodynamically  unstable bleed.  She has now been stabilized in this process getting her  5th and 6th units of packed red blood cells.  She is not having  hematemesis.  EGD is now being done to rule out rapid transit upper GI  source.  I have explained this to [name] as best I could.  She has  limited understanding.  She is a ward of the state and there is no  family or power of attorney.  This procedure is being done emergently.   PROCEDURE NOTE:  O2 saturation, blood pressure, pulse, and respiration  were monitored throughout the entire procedure.   CONSCIOUS SEDATION:  Versed 3 mg IV and Demerol 50 mg IV in divided  doses.  Cetacaine spray for topical pharyngeal anesthesia.   INSTRUMENT:  Pentax video chip system.   FINDINGS:  Examination of the tubular esophagus revealed no mucosal  abnormalities.  EG junction was easily traversed.  Stomach:  In the  stomach, there was a large amount of fresh blood and clot, which  precluded complete examination of the stomach.  I was able to easily  advance the scope and antrum where she was noted to have some antral  erosions.  Pylorus was patent and easily traversed.  Examination of the  bulb and second portion  revealed no abnormalities.  Attention was turned  back to the stomach where I did do a lavage and suctioned out some of  the blood and clot.  I got a couple of 100 mLs out.  On the  retroflexion, I was able to see a 6-mm deep ulcer crater with brisk  bleeding on the angularis.  Please see multiple photographs.  This  lesion was treated with 3 mL of 1:10,000 epinephrine, injected  submucosally and a total of 5 hemostasis.  The clips were applied with  good hemostasis being achieved.  This was a difficult approach and I had  to work from the retroflex position.  Please see serial photographs  taken for the record.  A good two-thirds of the gastric mucosa were not  seen on today's examination.  The patient tolerated the procedure well.   IMPRESSION:  1. Normal esophagus.  2. Much blood and clot in stomach precluded complete examination,      actively bleeding gastric ulcer on the angularis and treated as  described above, antral erosions, patent pylorus, and normal-      appearing D1 and D2.   RECOMMENDATIONS:  1. Continue b.i.d. PPI therapy.  2. Clear liquid diet.  3. We would transfuse her to an H&H around 10 and 30.  4. Check H&H again at 1600 today.  5. Check H. pylori serologies.  6. No MRI until resolution clips are noted to have passed.      [name], M.D.  Electronically Signed     RMR/MEDQ  D:  01/12/2008  T:  01/13/2008  Job:  [id number]   cc:   Tesfaye D. Legrand Rams, MD  [phone]   Alison Murray, M.D.  Fax: [place] Karie Kirks, M.D.  Fax: [phone]

## 2010-07-30 NOTE — Group Therapy Note (Signed)
[name], [name]             ACCOUNT NO.:  [id number]   MEDICAL RECORD NO.:  [id number]          PATIENT TYPE:  INP   LOCATION:  IC07                          FACILITY:  [facility]   PHYSICIAN:  Edward L. Luan Pulling, M.D.DATE OF BIRTH:  03-06-1976   DATE OF PROCEDURE:  DATE OF DISCHARGE:                                 PROGRESS NOTE   The patient of Dr. Josephine Cables, Ms. Yoshinaga continues about the same.  She  has a little change.  She is still having fever.  She is growing Gram-  negative rods.  This is from her cath tip.   PHYSICAL EXAMINATION:  GENERAL:  She looks pretty well as far as her  respiratory status is concerned.  VITAL SIGNS:  Her exam otherwise, temperature 100.8, pulse still 124,  respirations 20, blood pressure 130/98, and O2 sats 98% on 3 L.  CHEST:  Her chest is clear.  HEART:  Her heart is regular.   ASSESSMENT:  She has multiple medical problems including respiratory  failure x2.  She has a renal failure based on her lupus, but she has  improved and I do not think there is much respiratory going on at this  point and I am going to plan to sing off at this point.   Thank you very much for allowing me to see her with you.  I will be glad  to see her again at your request.      Percell Miller L. Luan Pulling, M.D.  Electronically Signed     ELH/MEDQ  D:  01/11/2008  T:  01/11/2008  Job:  [id number]

## 2010-07-30 NOTE — H&P (Signed)
[name], [name]:  [id number]   MEDICAL RECORD NO.:  [id number]          PATIENT TYPE:  INP   LOCATION:  2917                         FACILITY:  [facility]   PHYSICIAN:  Jana Hakim, M.D. DATE OF BIRTH:  07/09/1975   DATE OF ADMISSION:  04/02/2007  DATE OF DISCHARGE:                              HISTORY & PHYSICAL   NOTE:  This is an unassigned patient.   CHIEF COMPLAINT:  Increased swelling of the face and neck.   HISTORY OF PRESENT ILLNESS:  This is a 35 year old female with a history  of systemic lupus erythematosus who presented to the emergency  department at [facility] for evaluation and had a workup which  included a CT scan of the neck which revealed a retropharyngeal abscess  and diffuse face and neck cellulitis.  The patient reported having a  sore throat and increased swelling and difficulty swallowing for the  past 3 days.  She also reports being unable to eat and drink.   PAST MEDICAL HISTORY:  Significant for systemic lupus erythematosus with  chronic encephalopathy, seizure disorder, anemia, mild mental  retardation.   MEDICATIONS:  1. Include Neurontin 100 mg one p.o. daily at bedtime.  2. Paxil 20 mg one p.o. daily.  3. Lisinopril 20 mg one p.o. daily.  4. Multivitamin one p.o. daily.  5. Folic acid 1 mg one p.o. daily.   ALLERGIES:  No known drug allergies.   SOCIAL HISTORY:  Nonsmoker, nondrinker.   FAMILY HISTORY:  Is noncontributory.   PHYSICAL EXAMINATION:  GENERAL:  This is a small in stature, well-  nourished, well-developed female in discomfort but no acute distress.  VITAL SIGNS:  Her vital signs are temperature 99.7, blood pressure  174/112, heart rate at [facility] was 148, respirations 16, O2  saturation 100% on room air.  HEENT:  Normocephalic except for the diffuse submental edema of her face  and neck.  This area is painful to palpation and fluctuant.  The patient  is unable to open her mouth  secondary to the swelling.  Eye examination,  pupils are equally round and reactive to light.  Extraocular muscles are  intact and funduscopic benign.  NECK:  As mentioned above.  Unable to visualize the oropharynx secondary  to the edema.  CHEST:  Wall nontender, regular rate and rhythm.  No murmurs, gallops or  rubs.  LUNGS:  Lungs are clear to auscultation bilaterally.  ABDOMEN:  Positive bowel sounds, soft, nontender, nondistended.  EXTREMITIES:  Without cyanosis, clubbing or edema.  NEUROLOGICAL:  The patient is alert.  Speech is sparse but clear.  There  are no focal deficits on examination.   LABORATORY STUDIES:  White blood cell count 31.6, hemoglobin 12.6,  hematocrit 37.9, MCV 82.9 and platelets 278.  Neutrophils 67%, band  neutrophils 27%, lymphocytes 3%.  Chemistry with a sodium of 140,  potassium 3.6, chloride 111, bicarb 19, BUN 21, creatinine 1.59 and  glucose 21.  Urinalysis - small bilirubin, trace ketones, greater than  300 protein per mg per deciliter and nitrates and leukocyte esterase  negative.  Urine glucose  negative.  Blood cultures were performed at  [facility] and are pending.   ASSESSMENT:  A 35 year old female being admitted with:  1. Face and neck cellulitis.  2. Retropharyngeal abscess and phlegmon on CT scan of the soft tissue      of the neck.  3. History of systemic lupus erythematosus.  4. Encephalopathy.  5. Early sepsis.   PLAN:  The patient has been admitted to [facility] for further  evaluation and treatment.  She will continue on IV antibiotic therapy of  clindamycin and Unasyn which may be changed pending culture results.  ENT was notified of the patient prior to transfer and will consult on  the patient for further surgical intervention if needed.  The patient  will be placed on a clear liquid diet and will be monitored for  cardiovascular and pulmonary changes.  The patient has no airway  compromise at this time.  This  patient will continue on her regular  medications, and DVT and GI prophylaxis have been ordered as well.      Jana Hakim, M.D.  Electronically Signed     HJ/MEDQ  D:  04/03/2007  T:  04/03/2007  Job:  [id number]   cc:   Leonides Sake. Lucia Gaskins, M.D.

## 2010-07-30 NOTE — Group Therapy Note (Signed)
[name], [name]             ACCOUNT NO.:  [id number]   MEDICAL RECORD NO.:  [id number]          PATIENT TYPE:  INP   LOCATION:  A319                          FACILITY:  [facility]   PHYSICIAN:  Edward L. Luan Pulling, M.D.DATE OF BIRTH:  10-10-1975   DATE OF PROCEDURE:  DATE OF DISCHARGE:                                 PROGRESS NOTE   The patient of Dr. Legrand Rams.  [name] is overall, I think a little bit  better.  She has no new complaints.  She is still complaining of pain.  She still does not seem to feel well, but nothing particularly new.   Her temperature is 99.2, pulse 120, respirations 20, blood pressure  160/100, O2 sats 98% on 2 L.   Her last chest x-ray did not show anything specific to explain her  fever.  She has a TSH, it was normal.  Stool culture, re-incubated.  C.  diff, negative.   ASSESSMENT:  She is tachycardic.  She still has some fever.  She, of  course, has renal failure.  She has a lupus.   PLANS:  To continue with her treatments and medications, no changes.      Edward L. Luan Pulling, M.D.  Electronically Signed     ELH/MEDQ  D:  01/10/2008  T:  01/10/2008  Job:  [id number]

## 2010-08-02 NOTE — Op Note (Signed)
NAMEJAIEL, [name]             ACCOUNT NO.:  [id number]   MEDICAL RECORD NO.:  [id number]          PATIENT TYPE:  INP   LOCATION:  [id number]                          FACILITY:  [facility]   PHYSICIAN:  Caro Hight, M.D.      DATE OF BIRTH:  1976/03/03   DATE OF PROCEDURE:  07/01/2006  DATE OF DISCHARGE:                                PROCEDURE NOTE   PRIMARY PHYSICIAN:  Nat Christen, M.D.   REFERRING PHYSICIAN:  [facility].   PROCEDURE:  Esophagogastroduodenoscopy.   INDICATIONS FOR PROCEDURE:  [name] is a 35 year old female who was  admitted to the hospital with decreased p.o. intake and a major  depressive disorder. She also has mild mental retardation.  She weighed  93.4 pounds according to the review of the electronic medical records in  October of 2006.  Her current weight is 94 pounds.  She also has an  albumin which ranges from 2.5 to 3.3.  When questioned she states she  does feel hungry.  She reports that she will eat if given the particular  foods that she prefers to eat.  She did have an abdominal ultrasound  which showed no evidence of cholecystitis or any acute intra-abdominal  pathology.  The EGD is being performed today to rule out any other  organic pathology which may be preventing her from eating.   FINDINGS:  1. Normal esophagus without any evidence of Barrett's erosion,      ulceration or mass.  2. Normal stomach without any evidence of erosions or ulcerations.  3. Normal duodenal bulb and second portion of the duodenum.   RECOMMENDATIONS:  1. [name] is adamantly against having a percutaneous feeding tube      being placed.  She is alert and aware enough to understand what a      PEG feeding tube means.  She would only pull a feeding tube out      and, therefore, is not a candidate for a percutaneous feeding tube.      Have discussed with her primary team.  2. I would treat her underlying depression.  If she requires any type      of assistance  with nutrition then I recommend placing a Port-A-Cath      with hyperalimentation.  3. I would encourage p.o. intake with offering her food that she      prefers to eat.   MEDICATIONS:  1. Demerol 50 mg IV.  2. Versed 3 mg IV.   PROCEDURE TECHNIQUE:  Physical examination was performed and informed  consent was obtained from the patient after explaining the benefits,  risks and alternatives to the procedure.  The patient was connected to  the monitor and placed in the left lateral position.  Continuous oxygen  was provided by nasal cannula and IV medicine administered with an  indwelling cannula.  After the administration of sedation, the patient's  esophagus was intubated  and the scope was advanced under direct visualization to the second  portion of the duodenum.  The scope was subsequently removed slowly  carefully examining the color, texture, anatomy  and integrity of the  mucosa on the way out.  The patient was recovered in the endoscopy and  then discharged to the floor in satisfactory condition.      Caro Hight, M.D.  Electronically Signed     SM/MEDQ  D:  07/01/2006  T:  07/01/2006  Job:  [id number]   cc:   Nat Christen, M.D.

## 2010-08-02 NOTE — H&P (Signed)
[name], [name]             ACCOUNT NO.:  [id number]   MEDICAL RECORD NO.:  [id number]          PATIENT TYPE:  INP   LOCATION:  A201                          FACILITY:  [facility]   PHYSICIAN:  Erica Haff, MD     DATE OF BIRTH:  [birth year]   DATE OF ADMISSION:  05/15/2006  DATE OF DISCHARGE:  LH                              HISTORY & PHYSICAL   Patient is followed by Dr. Nat [name].  She is also followed by Dr.  Merlene [name].   Please note patient was recently discharged on May 07, 2006 after  an eight day stay in the hospital for subacute encephalopathy.   ADMISSION DIAGNOSES:  1. Likely orthostatic hypotension, possibly secondary to dehydration.  2. Supraventricular tachycardia related to posture.  3. History of systemic lupus erythematosus.  4. Seizure disorder on Dilantin.  5. History of iron-deficiency anemia.  6. Hypertension.  7. History of chronic lower extremity weakness requiring a walker to      ambulate.  8. Possible mental retardation.  9. Subacute Encephalopathy   CHIEF COMPLAINT:  High heart rate and low blood pressure.   HISTORY OF PRESENT ILLNESS:  Patient is a 35 year old African-American  female who was recently discharged after being evaluated with a subacute  encephalopathy.  She was at her PMD's office earlier today and was found  to have a heart rate of more 140.  Blood pressure was unrecordable.  The  patient was alert.  She was sent to the ED, where she was noted to have  a normal vital signs and was sent back home.  At home, she was being  evaluated by the physical therapy where again, she was found to be  tachycardic, hypotensive, and was sent back to the ED.   The patient is completely asymptomatic.  Denies any chest pain,  shortness of breath, palpitations.  Denies any nausea, vomiting, or  diarrhea over the past few days.  She says she has been eating and  drinking okay.  She has been taking all of her medications as  prescribed.  In  all, the patient has somewhat of a flat affect and  speaks mostly in monosyllables.  Unable to give any history suggestive  of dehydration at this point.   HOME MEDICATIONS:  Not available.  Based on discharge summary  previously, she is on:  1. Folic acid 1 mg p.o. daily.  2. Lisinopril 20 mg p.o. daily.  3. Paxil 20 mg p.o. daily.  4. Prednisone 20 mg p.o. daily.  She is on a tapering dose.  5. Ferrous sulfate 325 mg p.o. daily.  6. Neurontin 300 mg p.o. nightly.  7. She is also on Dilantin 200 mg p.o. nightly.  8. Hydrocodone as needed.   ALLERGIES:  No known drug allergies.   PAST MEDICAL HISTORY:  1. Systemic lupus erythematosus for many years.  2. History of recently diagnosed seizure disorder.  3. History of mental retardation.  4. Iron-deficiency anemia.   During her previous admission, she had an extensive workup for  encephalopathy.  No clear etiology was found.  Dr. Merlene [name] felt that  this could all be related to SLE.  We made her an appointment with the  neurology clinic at [facility] to see if they had anything else to offer  this patient.   Of note, she was tachycardic during the previous admission, sinus  tachycardia in 120s with no clear-cut etiology.   FAMILY HISTORY:  Unobtainable.   SOCIAL HISTORY:  Lives with her aunt.  Other history is not available.   REVIEW OF SYSTEMS:  Unable to do on this patient.   PHYSICAL EXAMINATION:  VITAL SIGNS:  Temperature 98.3, heart rate when  she presented was in the 130s.  Blood pressure 98/64.  Heart rate went  up to 170s after some point when she was stood up; the blood pressure  was 103/70. The HR dropped immediately, to 103-110, sinus.  Blood  pressure last reading was 117/70s.  Saturations 100% on room air.  GENERAL:  A thin African-American female in no distress.  HEENT:  There is no pallor.  No icterus.  Oropharynx is moist.  No  lesions are noted.  LUNGS:  Clear to auscultation bilaterally.  CARDIOVASCULAR:  S1  and S2.  Slightly tachycardic.  Regular.  No murmurs  appreciated.  No carotid bruits heard.  ABDOMEN:  Soft, nontender, nondistended.  Bowel sounds present.  No mass  or organomegaly is appreciated.  EXTREMITIES:  No edema.  Peripheral pulses were palpable.  NEUROLOGIC:  She has evidence for nystagmus, otherwise no other focal  neurological deficits are present.   LAB DATA:  She had labs done earlier today, including a venous pH of  7.35, venous pCO2 43, hemoglobin 16.7, which is a dramatic rise from the  last level of 12.  Her BMET was unremarkable.   No imaging studies have been done.   The EKG was done, which showed sinus tachycardic rhythm at a rate of  113.  Axis is normal.  Intervals are normal.  No concerning Q waves are  noted.  No ST-T wave segment changes are noted.   When her heart rate went up to the 170s, the rhythm strip showed SVT.  Very regular.   Her TSH was normal just a few weeks ago.   ASSESSMENT:  This is a 35 year old African-American female with systemic  lupus erythematosus and subacute encephalopathy, possibly related to the  systemic lupus erythematosus, who was found to have tachycardia and  hypertensive and was sent into the ED.  Patient could have postural-  associated tachycardia.  She could be dehydrated, although clinical  examination does not suggest that.  I am not sure if SLE can cause any  of these autonomic dysfunctions.  Her other medical issues remain  stable.  She does have nystagmus, so Dilantin level will be important.   PLAN:  1. Cardiac issues:  We will observe her on telemetry to see what she      does.  Will hold her antihypertensive agent for now.  We will get      an echocardiogram to evaluate her cardiac structures.  Will rule      her out for acute cardiac syndrome with serial cardiac markers,      although it is unlikely that this would be positive.  If need be, a     cardiology consult will be obtained.  There is no reason to  suspect      sepsis at this point with her being afebrile; however, we will send      a UA and will also  get a chest x-ray done to rule out pneumonia.  2. Possible dehydration:  We will give her IV bolus and IV fluids in      order to correct this.  3. History of systemic lupus erythematosus:  Continue the prednisone      at 20 mg daily for now.  4. DVT/GI prophylaxis will be continued.  5. Continue all of her other medications as before.   Further management decision will be based on the results of initial  testing and patient's response to treatment.      Erica Haff, MD  Electronically Signed     GK/MEDQ  D:  05/15/2006  T:  05/15/2006  Job:  [id number]   cc:   [name], M.D.

## 2010-08-02 NOTE — Discharge Summary (Signed)
[name], [name]             ACCOUNT NO.:  [id number]   MEDICAL RECORD NO.:  [id number]          PATIENT TYPE:  INP   LOCATION:  [id number]                          FACILITY:  [facility]   PHYSICIAN:  Bonnielee Haff, MD     DATE OF BIRTH:  22-Mar-1975   DATE OF ADMISSION:  12/24/2004  DATE OF DISCHARGE:  10/16/2006LH                                 DISCHARGE SUMMARY   Please review H&P dictated by Dr. Marcello Moores for details regarding patient's  presenting illness.   DISCHARGE DIAGNOSES:  1.  Flare of systemic lupus erythematosus.  2.  Left foot pain likely neuropathic secondary to systemic lupus      erythematosus.  3.  Chronic anemia, severe iron deficient.   BRIEF HOSPITAL COURSE:  Briefly, this is a 35 year old African-American  female who is new to [place] from [place] who presented to the hospital  with complaints of pain and swelling in the left foot.  The patient  describes the pain as sharp shooting pain.  Patient was found to have left  foot drop as well.  This has been ongoing for more than two weeks.  Patient  moved from [place] where all these symptoms started.  Patient gave history  of SLE which was diagnosed in [place] for which she has required periodic  steroids to control the flare-ups.   Patient was also found to have fever during this hospitalization.  Since  patient was experiencing this left foot/leg pain, concern was about DVT for  which patient underwent Doppler study which did not show any DVT.  Patient  also underwent x-rays of the foot which also did not show any acute  traumatic finding.  Patient's pulses are all present.  She was subsequently  seen by physical therapy who was able to ambulate the patient using a  crutch.  It was recommended that patient have outpatient physical therapy.   The patient did not have any other focal neurological deficits.  Patient  initially was started on IV antibiotics for her fever as there was a  suspicion of cellulitis  as well.  However, on subsequent examinations there  was no erythema, warmth, or swelling, and the antibiotics were discontinued.  She remained febrile despite being on the antibiotics.  I discussed this  case with Dr. Kristen Loader, rheumatologist at [facility], who recommended  starting the patient on steroids which we did.  Subsequently, patient's  temperature has subsided.  She has been afebrile for over three days now.  Her IV Solu-Medrol has been changed over to p.o. prednisone and she is  considered today stable for discharge from this standpoint.   Patient, however, continues to have this left foot pain which is sharp pain.  I have started the patient on Neurontin which should help patient's pains.  This morning I stood the patient up and she did walk a few steps without any  crutch and she did okay with that.  I do not think there is any more acute  need for hospitalization.  She does need to have close follow-up with  rheumatologist, Dr. Justine Null, which will be arranged  by him.  I have also signed  Dr. Orson Ape as her primary doctor who is the unassigned physician for today.   The above has been discussed in detail with the patient and she seems to be  in agreement.   Patient was also found to be severely anemic with a hemoglobin of 8.  We did  iron studies which suggested severe iron deficiency anemia for which we have  started her on ferrous sulfate.  Her hemoglobin has been stable during this  hospital stay without requiring any transfusions. There was no evidence for  overt bleeding.   ANA and DsDNA antibodies were checked, both of which were significantly  elevated. We attempted to get records from [place] but were unable to do so.   DISCHARGE MEDICATIONS:  1.  Prednisone taper 60mg  for four more days followed by 40mg  for seven days      followed by 20mg  until seen by rheumatologist.  2.  Ferrous sulfate 325mg  b.i.d.  3.  Neurontin 300 mg b.i.d.  4.  Protonix 40 mg daily.  5.   Vicodin 7.5/500 p.r.n. for pain.   FOLLOW-UP:  1.  Follow-up care with Dr. Justine Null to be arranged by him.  He will call the      patient for appointment.  2.  Dr. Orson Ape in two weeks.   DIET:  No restrictions.   PHYSICAL ACTIVITY:  Patient to have outpatient physical therapy for left leg  pain.  She will need to use crutch until she has regular PT.   Imaging studies done during this hospital stay include lower extremity  venous Dopplers with no evidence for DVT and x-rays of the foot as mentioned  above.  She also had a chest x-ray which also did not show any acute event.   CONSULTS:  I did discuss this patient over the phone  with Dr. Kristen Loader.      Bonnielee Haff, MD  Electronically Signed     GK/MEDQ  D:  12/30/2004  T:  12/30/2004  Job:  [id number]   cc:   Michael Litter, M.D.  Fax: [phone]   Leonides Grills, M.D.  Fax: [phone]

## 2010-08-02 NOTE — Group Therapy Note (Signed)
[name], [name]             ACCOUNT NO.:  [id number]   MEDICAL RECORD NO.:  [id number]          PATIENT TYPE:  INP   LOCATION:  A340                          FACILITY:  [facility]   PHYSICIAN:  Audria Nine, M.D.DATE OF BIRTH:  Jun 14, 1975   DATE OF PROCEDURE:  05/17/2006  DATE OF DISCHARGE:                                 PROGRESS NOTE   SUBJECTIVE:  The patient feels better today.  She was able to ambulate  yesterday around her room into the bathroom.  She has not had any  seizures since being in the hospital.  She denies any fever or chills.  Blood pressure is slightly improved.   OBJECTIVE:  GENERAL:  Conscious, alert, remains weak, lethargic,  chronically ill looking.  VITAL SIGNS:  Blood pressure is improved to 120/70 with pulse of 108.  Respiratory rate of 18.  Temperature is 97.1.  Oxygen saturation was  100% on room air.  HEENT:  Normocephalic, atraumatic.  Oral mucosa was moist, no exudate.  NECK:  Supple, no JVD.  LUNGS:  Clear clinically.  HEART:  S1, S2, regular.  ABDOMEN:  Soft, nontender.  Bowel sounds present.  EXTREMITIES:  No edema.   LABORATORY DATA:  CBC and basic metabolic profile are stable.   ASSESSMENT/PLAN:  1. Orthostatic hypotension secondary to dehydration.  Patient received      IV fluids and blood pressure a lot better now.  She is also able to      ambulate without feeling lightheaded or dizzy.  Will decrease her      IV fluids to 75 cc an hour.  I have discontinued her lisinopril      which may have been exacerbating her hypotension.  2. Urinary tract infection.  Patient is stable with no fevers, no      evidence suggestive of sepsis.  She will continue Levaquin.  Per      disposition, will continue telemetry and transfer her to 3A.      Audria Nine, M.D.  Electronically Signed     AM/MEDQ  D:  05/17/2006  T:  05/17/2006  Job:  [id number]

## 2010-08-02 NOTE — H&P (Signed)
[name], [name]             ACCOUNT NO.:  [id number]   MEDICAL RECORD NO.:  [id number]          PATIENT TYPE:  INP   LOCATION:  A211                          FACILITY:  [facility]   PHYSICIAN:  Audria Nine, M.D.DATE OF BIRTH:  [birth year]   DATE OF ADMISSION:  12/24/2004  DATE OF DISCHARGE:  LH                                HISTORY & PHYSICAL   PRIMARY CARE PHYSICIAN:  The patient is unassigned.   ADMISSION DIAGNOSES:  1.  Fever.  2.  Pain with swelling of the left leg, foot, and ankle.  3.  History of systemic lupus erythematosus diagnosed in December 2005 with      frequent exacerbations.  4.  Chronic anemia which patient states is familial.   CHIEF COMPLAINT:  Pain left foot and weakness with fever.   HISTORY OF PRESENT ILLNESS:  [name] is a 35 year old African-American  female who presented to the emergency room with complaints of pain and  swelling in her left foot. She reports that this has been going on for a  over week.  The patient has just recently relocated from [place] where she  was living until now.  She currently lives with her cousin and two other  children.  She was diagnosed with lupus about a year ago she had extensive  body rash with a butterfly rash on her face.  The patient states that she  has been admitted to the hospital for frequent exacerbations, has been on  multiple steroid tapers.  She currently is not on any steroids.  She reports  falling and injuring her left foot but did not do anything to have it  evaluated until now.  Sometime this evening, she also noticed that she was  having a fever.   She reports that whenever she gets exacerbations, she usually gets fever  with no underlying etiology other than her lupus identified.  She denies any  cough, no shortness of breath. No hemoptysis.  No headache, dizziness or  light-headedness.  No nausea, vomiting, abdominal pain or diarrhea.  She  continues to have general body itching with  rash.   Denies any change in her urine color or volume.   Initial evaluation in the emergency room revealed a fever of 102.5, some  slight leukocytosis with increased bands.  A D-dimer was also slightly  elevated.  She was also anemic but patient says she is about at her baseline  although she has had transfusions in the past.  She has been admitted now  for further evaluation and management.   PAST MEDICAL HISTORY:  1.  Systemic lupus erythematosus diagnosed in November 2005 in [place].  2.  Chronic anemia, familial with past history of transfusions.   MEDICATIONS:  She is currently taking Tylenol p.r.n. but has been on  prednisone in the past for what she describes are exacerbations of her SLE.   ALLERGIES:  She has no known drug allergies.   FAMILY HISTORY:  Positive for chronic anemia.  She does not know the exact  etiology.  It is also positive for diabetes and hypertension.   SOCIAL HISTORY:  The patient is a lifelong nonsmoker.  Does not use drugs or  drink alcohol.  She has two children, ages 86 and 2.  She is a single mom.  She recently located from [place] about a week ago. She flew from [place] to  [place].  She denies any previous history of deep venous thrombosis.   REVIEW OF SYSTEMS:  Ten point review of systems otherwise negative except as  mentioned in the history of present illness.   PHYSICAL EXAMINATION:  Conscious, alert, comfortable, no acute distress.  Well oriented to time, place, and person.  The patient was continuously  itching while I was examining her in the emergency room.  VITAL SIGNS:  Blood pressure 118/63, pulse right around 114, respiratory  rate 20, temperature max was 102.5 when she was high.  She had a pain scale  on admission of 8/10; this improved to 1/10 with analgesia.  HEENT:  Normocephalic, atraumatic.  The patient has a butterfly rash on her  face.  Oral mucosa is moist with no exudates.  NECK:  Supple.  No JVD, lymphadenopathy.   LUNGS:  Clear clinically with good air entry bilaterally.  HEART:  S1, S2 regular.  No S3, S4 gallops or rubs.  ABDOMEN:  Soft, nontender.  Bowel sounds positive. No mass palpable.  EXTREMITIES:  No pitting pedal edema was noted on the right.  The left foot  appeared slightly tender and warm to touch.  It was not markedly swollen.  Dorsalis pedis pulse was palpable bilaterally.  Range of movement in the  ankle was satisfactory.  CNS:  Exam grossly intact with no focal deficits.   LABORATORY/DIAGNOSTIC STUDIES:  She had a CBC with a white blood cell count  of 11.7, hemoglobin 8, hematocrit 24.2, MCV 69.2, MCH is 32.9.  She has  bandemia with greater than 20% bands.  A U/A was negative.  Sodium 130,  potassium 4.0, chloride 100, CO2 25, glucose 98, BUN 6, creatinine 0.7.  Calcium 7.3.  Total protein 6, albumin 2.2, AST 23, ALT 12, alkaline  phosphatase 73, total bilirubin 0.3.  ESR 42.  ________ was negative. PT  15.1, INR 1.2, PTT 38.  D-dimer 3.88.  Blood cultures are pending.   ASSESSMENT/PLAN:  [name] is a 35 year old African-American female with  a history of lupus diagnosed in November 2005 in [place].  The patient  recently located to [place] about a week ago.  She presented to the  emergency room with complaints of fever and pain in her left leg, foot, and  ankle.  The patient has documented a history of trauma.   Possible differentials include infection in that leg, possibly early  cellulitis although it does not have induration .  Other possibilities  include deep venous thrombosis.  The patient's D-dimer is elevated.  She  also has lupus and possibly could have a lupus anticoagulant which could  make her hypercoagulable.   Our plan is to admit her at this time and treat her empirically with Ancef  for presumed cellulitis in her left leg.  We will also start her empirically  on Lovenox 1 mg/kg and with a Doppler of her left leg in the morning.  We will also get an  x-ray to rule out occult fracture.   If her fever continues despite these measures, the patient may need to be  referred to [facility] for rheumatological evaluation.   I will institute pain control p.r.n.   I have discussed our plan with the patient.  She verbalized full  understanding.  Her severe anemia appears to be stable at this time.  She is  hemodynamically stable.  Will not transfuse her unless her H&H shows a trend  towards dropping especially with her being on Lovenox.   Stool OB x3 has been ordered.   CODE STATUS:  She is a full code.      Audria Nine, M.D.  Electronically Signed     AM/MEDQ  D:  12/25/2004  T:  12/25/2004  Job:  [id number]

## 2010-08-02 NOTE — Group Therapy Note (Signed)
[name], [name]             ACCOUNT NO.:  [id number]   MEDICAL RECORD NO.:  [id number]          PATIENT TYPE:  INP   LOCATION:  A212                          FACILITY:  [facility]   PHYSICIAN:  Audria Nine, M.D.DATE OF BIRTH:  [birth year]   DATE OF PROCEDURE:  06/27/2006  DATE OF DISCHARGE:                                 PROGRESS NOTE   SUBJECTIVE:  The patient feels better today. She is sitting up in a  chair trying to eat her breakfast. She denies any pain. No headaches. No  dizziness. States her right thigh pain is slightly improved. She denies  any chest pain or shortness of breath.   OBJECTIVE:  Conscious, alert, comfortable, not in acute distress.  VITAL SIGNS:  Blood pressure was 100/82, pulse of 90, respiratory rate  of 18, temperature 98.9. Oxygen saturation was 100% on room air.  HEENT EXAM:  Normocephalic, atraumatic. Oral mucosa was dry. No  exudates.  NECK:  Was supple. No JVD or lymphadenopathy.  LUNGS:  Were clear clinically. Good air entry bilaterally.  HEART:  S1 and S2 regular. No S3, gallops or rubs.  ABDOMEN:  Was soft, nontender. Bowel sounds were positive. No masses  palpable.  EXTREMITIES:  No edema.   LABORATORY DATA:  White blood cell count was 7.9, hemoglobin 10.2,  hematocrit 30.3, platelet count 244.   Electrolytes were normal. Magnesium 1.7. Calcium was 8.4.   ASSESSMENT AND PLAN:  1. Dehydration. Patient improving. BUN and creatinine shows      improvement. We will continue hydration and also her diet. Will      plan to discontinue IV fluids over the next day also.  2. History of right leg pain. No evidence of DVT. No injury or trauma.      Will continue to follow.  3. History of systemic lupus erythematosus. The patient has had      multiple admissions in the past for what was felt to be      exacerbation of the lupus. 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 She is currently on prednisone 30 mg p.o. once a day.  4.  Chronic encephalopathy, likely from systemic lupus erythematosus      above.  5. Seizure disorder. The patient remains on Neurontin 300 mg p.o.      every night and also Dilantin 200 mg p.o. every night. No recent      history of seizure. Last level was noted to be subtherapeutic. We      will repeat a level on Monday.  6. History of malnutrition. The patient is currently on calorie count.      We will continue to encourage increased p.o. intake. Her weight,      though, has been noticed to be stable. She is on a fair amount of      prednisone, and one would have expected a slight amount of weight      gain.  7. Depression with likely psychomotor retardation. The patient's Paxil      was recently increased.  8. Anemia. Stable. Continue to follow.  DISPOSITION:  Continue DVT prophylaxis and GI prophylaxis. The patient  likely go home in the next two to three days.      Audria Nine, M.D.  Electronically Signed     AM/MEDQ  D:  06/27/2006  T:  06/27/2006  Job:  [id number]

## 2010-08-02 NOTE — Consult Note (Signed)
NAMECIANNE, USUI             ACCOUNT NO.:  [id number]   MEDICAL RECORD NO.:  [id number]          PATIENT TYPE:  INP   LOCATION:  A204                          FACILITY:  [facility]   PHYSICIAN:  Gaston Islam. Neijstrom, MD  DATE OF BIRTH:  [birth year]   DATE OF CONSULTATION:  01/29/2005  DATE OF DISCHARGE:                                   CONSULTATION   DIAGNOSES:  1.  Systemic lupus erythematosus.  2.  Microcytic anemia.  3.  Leukocytosis with fever.   HISTORY:  This is a 35 year old African-American lady who has moved here  from [place] in October.  She states that she was diagnosed with lupus  approximately one year ago but has never seen a rheumatologist. She was  managed by, perhaps, doctors in the hospital setting with several  admissions.  She was given blood transfusion one time, she states, but is  not sure of how many units.   She recently was here on October 16th with an acute flare of lupus.  She had  an appointment with Dr. Justine Null this Monday, but it was rescheduled because of  death in his family, she states.   She was having pain all over, upper and lower extremities, torso, fevers, no  distinct chills.  She has a sore throat.  She was not having chest pain.  She felt slightly winded at times, however.  She states that she was  diagnosed a year ago with a rash over her face, joint pains, fevers.   MEDICATIONS ON ADMISSION:  Hydrocodone, prednisone, ferrous sulfate,  omeprazole, gabapentin, Combivent.   She has no known allergies.   She is a single mother.  She states that she has three children, ages 35, 76,  and 89.  She states that they are in good health.  She states that she has  one sister who is younger.  Her mother lives in [place], [place].  She is  alive and well.  She did not know her father.  She has not worked outside  the home.   She states that her biggest problem is weakness and fevers, just not feeling  well all over.  She occasionally has headaches.   She does not have one now.  She states that she knows she is in a hospital, but she cannot remember the  name.   PAST MEDICAL HISTORY:  As mentioned above.   She denies any tobacco use, alcohol use, or illegal drug use.   PHYSICAL EXAMINATION:  VITAL SIGNS:  She was as high as 101 degrees on  yesterday's flow chart.  She has been as high as 100.9 degrees today at 7  a.m.  SKIN:  Warm and dry to the touch.  It does not feel hot at this time.  LYMPH:  She has no palpable cervical, clavicular, supraclavicular, axillary,  or inguinal adenopathy.  She has no palpable hepatosplenomegaly.  HEART:  Regular rate and rhythm.  The rate is right around 80.  Blood  pressure is 140/90.  Respirations are 18 and unlabored.  Her heart does not  show murmur, rub or gallop.  LUNGS:  Clear at this time, anteriorly and posteriorly.  HEENT:  She has some candida within her mouth.  Pupils are asymmetric.  The  left is approximately 3-4 mm.  The right is 2 mm, at most.  They both  respond to light very minimally.  ABDOMEN:  Nontender.  Bowel sounds are diminished.  She does have what looks  like a butterfly rash over her nasal bridge.  NEUROLOGIC:  She is alert.  She knows the day of the week, the date, the  month, and the year, but she seems somewhat lethargic.  She has some faint  alopecia over her scalp.  She is wearing a hair piece.  She is left-handed.  She follows all commands, but she seems very slow and sluggish mentally.  In  talking with her, however, all questions appear to be answered adequately.  Toes are downgoing.  There is no evidence for a Babinski's sign.  She is  weak, however, in her lower extremities, as far as pelvic girdle  musculature.  She can easily overcome with her psoas muscles.   Her labs on this admission show that her white count is elevated at 23-  24,000.  She does have a shift to the left.  She did come in on steroids.  Hemoglobin 10.5, MCV 72.7, platelets 639,000.  Her  ESR was only 12  yesterday.  Her PT/PTT were normal.  Her liver enzymes were normal.  Albumin  was 2.7.  Total protein was 6.2.  BUN and creatinine were normal at 13 and  0.8, respectively.  Her C-reactive protein was high at 6.3 yesterday.  Normal is less than 0.6.   This lady has a microcytic anemia, and Dr. Megan Salon has already ordered all  of the appropriate lab tests in my opinion.  He has ordered a  hemoglobin  electrophoresis.  He has ordered some iron studies in the form of serum  ferritin.  I think she needs some Diflucan for her mouth.  She certainly  needs to keep up her appointment with Dr. Justine Null, but she has adequate reason  for microcytic anemia from anemia of chronic disease alone, but we will make  sure she is not iron deficient.  Ferritin may or may not be helpful in that  regard.   We will probably see her as an outpatient and treat her as needed.  Right  now, her hemoglobin does not need therapy.      Gaston Islam. Tressie Stalker, MD  Electronically Signed     ESN/MEDQ  D:  01/29/2005  T:  01/29/2005  Job:  [id number]   cc:   Karlyn Agee, M.D.

## 2010-08-02 NOTE — Consult Note (Signed)
[name], GENERETTE NO.:  [id number]   MEDICAL RECORD NO.:  [id number]          PATIENT TYPE:  INP   LOCATION:  A204                          FACILITY:  [facility]   PHYSICIAN:  Scarlett Presto, M.D.   DATE OF BIRTH:  1975-11-29   DATE OF CONSULTATION:  DATE OF DISCHARGE:                                   CONSULTATION   REFERRING PHYSICIAN:  Karlyn Agee, M.D.   PRIMARY CARE PHYSICIAN:  None currently.   HISTORY OF PRESENT ILLNESS:  Ms. Treto is a 35 year old female with a past  medical history significant for lupus, who was admitted to [facility] with complaints of fever and pain all over.  She describes sharp  anterior chest discomfort and shortness of breath.  She states that these  symptoms are typical of her lupus flares.  Upon admission, she was noted to  have elevated heart rate and blood pressure; therefore, cardiology was  consulted.  Ms. Overbaugh has been treated with antibiotics, steroids, and  pain medication and has noted some improvement in her symptoms since that  time.   PAST MEDICAL HISTORY:  1.  Systemic lupus.  2.  Chronic anemia with iron deficiency.   MEDICATIONS PRIOR TO ADMISSION:  Hydrocodone/APAP, prednisone, ferrous  sulfate, omeprazole, gabapentin, Combivent.   ALLERGIES:  No known drug allergies.   HOSPITAL MEDICATIONS:  1.  Rocephin 1 gm IV daily.  2.  Lovenox 40 mg daily.  3.  Ferrous sulfate 325 mg t.i.d.  4.  Neurontin 300 mg t.i.d.  5.  Toradol IV q.6h.  6.  Levaquin 750 mg IV daily.  7.  Solu-Medrol 60 mg IV q.6h.  8.  Lopressor 5 mg IV q.6h.  9.  Lopressor 25 mg b.i.d.  10. Protonix 40 mg p.o. daily.  11. IV normal saline potassium 150 cc/hr.   SOCIAL HISTORY:  Ms. Alias currently lives in [place] with family.  She  has recently moved here from [place] within the last one month.  She is  single and has two children.  She is on disability.  She denies any history  of tobacco abuse, alcohol use, or  illegal drug use.   FAMILY HISTORY:  Mother is alive.  Uncertain of her age.  She has a history  of anemia.  No coronary disease.  Father is deceased of unknown age and  unknown cause.  She has one younger sister with no coronary disease.   REVIEW OF SYSTEMS:  Positive for fevers and chills.  Positive for sore  throat.  No congestion.  No rashes.  Positive for chest discomfort and  shortness of breath.  No dyspnea on exertion, orthopnea, PND, or peripheral  edema.  Positive for palpitations.  No syncope.  No coughing.  No wheezing.  She denies any urinary frequency or dysuria.  She reports generalized  weakness.  She denies any nausea, vomiting, diarrhea, bright red blood per  rectum, or melena.  She does report myalgias and arthralgias all over her  body.  All other systems are reviewed and are negative, except as per HPI.   PHYSICAL EXAMINATION:  VITAL SIGNS:  Temperature 98.9, T max 100.9, pulse  103, respirations 20, blood pressure 149/101.  Weight 99.7.  GENERAL:  This is a well-developed female in no acute distress.  HEENT:  Normocephalic and atraumatic.  Pupils are equal, round and reactive  to light.  She does have a butterfly pattern rash on her face.  NECK:  No bruits.  No jugular venous distention.  CARDIOVASCULAR:  Regular rate and rhythm with a 2/6 systolic ejection murmur  noted.  LUNGS:  Clear to auscultation bilaterally.  BREASTS:  Clinical breast exam is deferred.  ABDOMEN:  Soft and nontender with active bowel sounds.  GU/RECTAL:  Deferred.  EXTREMITIES:  No clubbing, cyanosis or edema.  Distal pulses are intact  bilaterally.  No joint deformity is noted.  NEUROLOGIC:  She is alert and oriented x3.  Cranial nerves are grossly  intact.   She had right shoulder films which showed no acute abnormalities.   Bilateral hip and pelvis film with no acute abnormalities.   Chest x-ray revealed no acute abnormalities.   Electrocardiogram reveals sinus tachycardia at 139  beats per minute.  Axis  is normal.  Normal P-R interval and QRS duration and QTC.  She has some  nonspecific ST abnormalities.  No hypertrophy is noted.   LABORATORY DATA:  White blood cells 24.5, hemoglobin 10.4, hematocrit 31.6,  MCV 73, MCHC 33, platelets 547.  Sodium 134, potassium 4.5, chloride 107,  CO2 25, BUN 8, creatinine 0.6, glucose 110, total bilirubin 0.4, alk phos  78, AST 17, ALT 13, total protein 6.2, albumin 2.7, calcium 7.5.  CRP is  elevated at 6.3.  ESR 12.  BNP 336.  INR 1.1.  Urinalysis reveals protein  greater than 300.  Specific gravity 1.020, pH 5, negative leukocytes,  negative nitrites.   IMPRESSION/PLAN:  1.  Sinus tachycardia, likely secondary to lupus flare, fever, and/or severe      pain:  Her heart rate has decreased since admission and treatment of her      flare and treatment with beta blockers.  Would continue with her beta      blockers but be cautious, considering her sinus tachycardia is likely      secondary to underlying condition.  Would further evaluate with a 2D      echocardiogram.  2.  Systemic lupus flare:  Treatment per the primary team.  3.  Chronic anemia with iron deficiency, per primary team.  4.  Hypertension, possibly secondary to pain.  Patient has no history of      hypertension.  Would continue her      treatment with beta blockers and continue to monitor blood pressure      through her treatment during hospitalization.   We appreciate this consult and will be happy to follow this patient along  with you while she is in the hospital.      Cherre Blanc, P.A. [facility]      Scarlett Presto, M.D.  Electronically Signed    AB/MEDQ  D:  01/29/2005  T:  01/29/2005  Job:  [id number]

## 2010-08-02 NOTE — Group Therapy Note (Signed)
[name], [name]             ACCOUNT NO.:  [id number]   MEDICAL RECORD NO.:  [id number]          PATIENT TYPE:  INP   LOCATION:  [id number]                          FACILITY:  [facility]   PHYSICIAN:  Audria Nine, M.D.DATE OF BIRTH:  09-Jun-1975   DATE OF PROCEDURE:  06/28/2006  DATE OF DISCHARGE:                                 PROGRESS NOTE   SUBJECTIVE:  The patient says she is a little better today.  Appetite is  much improved.  Denies any nausea or vomiting.  Right foot pain still  continues to bother her but slightly improved.  She denies chest pain or  shortness of breath.   OBJECTIVE:  GENERAL:  Conscious, alert, comfortable.  Not in acute  distress.  VITAL SIGNS:  Blood pressure stable, unchanged from yesterday.  Saturation 100% on room air.  Physical exam is really unremarkable and unchanged from June 27, 2006.   LABORATORY DATA:  White blood cell count 8.1, hemoglobin 10.8,  hematocrit 31.6, platelet count 145.  Basic metabolic profile shows  potassium of 3.4, otherwise was unremarkable.   ASSESSMENT AND PLAN:  1. Dehydration.  The patient is improved.  Will discontinue IV fluids      at this time and continue to encourage p.o. intake.  2. History of right leg pain, possibly a neuropathy.  No evidence of      DVT.  Continue pain control as needed.  3. History of systemic lupus erythematosus .  The patient has had      multiple admissions in the past.  She is currently on prednisone 30      mg p.o. once a day.  4. Chronic encephalopathy, likely secondary to systemic lupus      erythematosus and cerebritis.  The patient appears much more stable      and lucid and well oriented today.  5. Malnutrition.  Prealbumin was slightly low at 14, and dietary      consult is pending.   DISPOSITION:  We are concerned about the patient being able to live with  her sister at home with a care giver who also works.  We have requested  social services to see her in the morning.  We  will transfer her to 3A,  discontinue telemetry, and continue dietary recommendations when  available.      Audria Nine, M.D.  Electronically Signed     AM/MEDQ  D:  06/28/2006  T:  06/28/2006  Job:  [id number]

## 2010-08-02 NOTE — Discharge Summary (Signed)
[name], [name]             ACCOUNT NO.:  [id number]   MEDICAL RECORD NO.:  [id number]          PATIENT TYPE:  INP   LOCATION:  A311                          FACILITY:  [facility]   PHYSICIAN:  Bonnielee Haff, MD     DATE OF BIRTH:  July 07, 1975   DATE OF ADMISSION:  04/29/2006  DATE OF DISCHARGE:  02/21/2008LH                               DISCHARGE SUMMARY   PRIMARY CARE PHYSICIAN:  Nat Christen, M.D.   She is also followed by Dr. Merlene Laughter for seizure disorder and  encephalopathy.  She also sees a rheumatologist in [place].   DISCHARGE DIAGNOSES:  1. Subacute encephalopathy, likely related to SLE, though, etiology      not clear.  2. History of systemic lupus erythematosus.  3. History of seizure disorder with Dilantin toxicity, now improved.  4. History of iron deficiency anemia, stable.  5. Hypertension, stable.  6. Lower extremity weakness, improved.  7. Depression.  8. Poor p.o. intake.   BRIEF HOSPITAL COURSE:  #1 - MENTAL STATUS CHANGES:  The patient is a 35-  year-old [place] American female who presented to the ED with confusion.  There was a mention of chest pain, but she did not really mention it to  me when I saw her in evaluation.  The patient was admitted to the  hospital.  It was found out that she was recently discharged from this  hospital with similar issues to just about a week ago.  According to the  family, she was much more confused, and thus she was sent back to the  ED.  The patient did have somewhat of an evaluation during her previous  admission including MRI of the brain which showed extensive white matter  changes but no acute lesion was noted.  Because she was readmitted to  the hospital with similar issues, again Neurology was consulted, and  this time she underwent a little bit more of a workup.  She underwent a  spinal tap by Dr. Merlene Laughter.  The results were not specific for any  problem.  Some of the blood tests are still pending, but the  cell count  was mildly elevated.  Glucose and protein were not very remarkable.  RPR  was negative.  JC virus antigen is still pending at the time.  HIV test  is also pending at this time.  The patient's mental status has somewhat  improved.  She is not confused.  Confounding this issue is also the fact  that the patient has mental retardation as a child.  Hence, I did not  know how much of that is interfering with her probable diagnosis of  encephalopathy.  In any case, the patient has been stable for the past  few days.  Dr. Merlene Laughter feels that she needs to be referred to [facility], and indeed, we have sent them paperwork for an  appointment, and the appointment has been made which will be discussed  below.  This will be at the Neurology Clinic.   #2 - CHEST PAIN:  Again, there was a mention in the ED notes that the  patient was apparently having some chest pain.  She denied this to me  when I saw her.  We did obtain a chest x-ray which was unremarkable.  EKG showed just sinus tachycardia at a rate of 107, but otherwise, was  again not really remarkable.  Cardiac enzymes were negative.  D. dimer  was only mildly elevated at 0.62.  Since the patient did not have any  more chest pains and shortness of breath since he was saturating 100% on  room air, we did not proceed with workup of this at this point.   #3 - SYSTEMIC LUPUS ERYTHEMATOSUS (SLE):  She is being followed by a  rheumatologist in [place].  She will be continued on the steroid taper,  and she was asked to follow up with him as soon as possible.   #4 - SEIZURE DISORDER:  The patient was diagnosed with seizures at  [facility] and was put on Dilantin.  She had a  high Dilantin level when she presented to the hospital here and required  adjustment to her doses.  She will need other Dilantin level checked in  a few days time.   #5- GAIT IMPAIRMENT:  This was noted when physical therapy was  trying to  see her initially.  Over a period of time, she did improve, and she was  able to ambulate about 300 feet with a walker.  She does have home PT  which will be continued.  We did obtain an MRI of the L/S spine to rule  out any concerning lesions in her spine, and no abnormality was noted.  I think her gait impairment is probably a combination of SLE and  generalized weakness from poor p.o. intake and poor nutritional status.  She was encouraged to take p.o. intake.   #6 - HYPERTENSION:  Stable.   #7 - DEPRESSION:  The patient was noted to be tearful during this  admission.  She was missing her family.  It is possible that some of her  poor p.o. intake may be a sign of depression.  Hence, I will go ahead  and start the patient on Paxil and PMD to please follow up on this.  She  does not, however, have any suicidal or homicidal ideations.   Her clinical status was discussed with patient's aunt.   DISCHARGE MEDICATIONS:  1. Folic acid 1 mg daily.  2. Lisinopril 20 mg once daily.  3. Paxil 20 mg once daily.  4. Dilantin 200 mg q.h.s., this is a changed dose.  5. Prednisone taper 40 mg for five days, then 30 mg for five days,      then 20 mg for five days, then 10 mg daily until seen by a      rheumatologist.  6. She may continue her ferrous sulfate, hydrocodone as needed and      Neurontin as before.   FOLLOWUP:  1. Appointment with Neurology Clinic at [facility] on      March 31 at 8:45 a.m.  2. Dr.  Merlene Laughter February 27 at 12:45 p.m.  3. The patient has been asked to call her rheumatologist as soon as      possible for an appointment.  4. PMD as needed.   DISCHARGE INSTRUCTIONS:  1. Home Health will be resumed.  2. Diet:  Low-salt diet.  3. Activity:  She uses a walker.   CONSULTATIONS:  Dr. Merlene Laughter.   PROCEDURES:  Lumbar puncture, discussed earlier.  She  also had an EEG  which was normal recording.  TOTAL TIME SPENT AT DISCHARGE:  40  minutes.      Bonnielee Haff, MD  Electronically Signed     GK/MEDQ  D:  05/07/2006  T:  05/07/2006  Job:  [id number]   cc:   Nat Christen, M.D.   Kofi A. Merlene Laughter, M.D.  Fax: [facility]

## 2010-08-02 NOTE — Consult Note (Signed)
NAMELACRETIA, DICIOCCIO             ACCOUNT NO.:  [id number]   MEDICAL RECORD NO.:  [id number]          PATIENT TYPE:  INP   LOCATION:  A331                          FACILITY:  [facility]   PHYSICIAN:  R. Garfield Cornea, M.D. DATE OF BIRTH:  April 10, 1975   DATE OF CONSULTATION:  06/30/2006  DATE OF DISCHARGE:                                 CONSULTATION   REQUESTING PHYSICIAN:  [facility].   REASON FOR CONSULTATION:  PEG placement.   HISTORY OF PRESENT ILLNESS:  The patient is a 35 year old female, a  patient of Dr. Nat Christen, who was admitted to the hospitalists  service due to dehydration, poor p.o. intake with a history of weight  loss of 20 pounds in the last few months.  She has a history of SLE and  chronic encephalopathy as well as mental retardation.  Since her  hospitalization, she has had a calorie count which has shown that she is  meeting 25% of her caloric needs and 35% of her protein needs in over a  3-day period of time.  Her prealbumin is slightly low at 14.3; her  albumin was 2.5.  Cortisol level and TSH levels have been normal.  She  is going to be placed in the nursing home upon discharge.  We have been  asked to see the patient regarding possible PEG placement.  The patient  denies any gastroesophageal reflux disease, dysphagia, abdominal pain,  constipation, melena or bright red blood per rectum.  She had an  abdominal ultrasound a couple of months that showed gallstones.   MEDICATIONS AT HOME:  1. Folic acid 1 mg daily.  2. Multivitamin daily.  3. Lisinopril 20 mg daily.  4. Paxil 20 mg daily.  5. Prednisone 20 mg daily.  6. Ferrous sulfate 325 mg b.i.d.  7. Dilantin 200 mg daily.  8. Neurontin 300 mg nightly.   ALLERGIES:  No known drug allergies.   PAST MEDICAL HISTORY:  1. History of IDA.  2. Depression.  3. GERD.  4. Hypertension.  5. Hyperglycemia.  6. Peripheral neuropathy.  7. Osteoarthritis.  8. SLE with nephritis.  9. Migraines.  10.Left foot drop.  11.Seizure disorder.  12.Chronic encephalopathy possibly due to SLE.  13.Multiple hospitalizations over the last several months and a recent      one for dehydration.  14.History of mental retardation.  15.Gallstones.   FAMILY HISTORY:  Maternal grandfather had lung cancer.   SOCIAL HISTORY:  She lives with her aunt.  Her mother is living in  [place].  Nursing home placement is planned.  The patient has 3  children, ages 43, 43 and 57; the 73-year-old is living with her.  She has a  history of tobacco use, but quit in 2006, reportedly a 15-pack-year  history.  She denies any alcohol or drug use.   REVIEW OF SYSTEMS:  See HPI for GI and CONSTITUTIONAL.  CARDIOPULMONARY:  She denies any chest pain or shortness of breath.  GENITOURINARY:  She  denies any dysuria or hematuria.   PHYSICAL EXAMINATION:  VITAL SIGNS:  Temperature 97.5, pulse 86,  respirations 13 and  blood pressure 125/84.  Weight 97 pounds; November  2006, her weight was 114 pounds.  GENERAL:  A young, black, thin female in no acute distress.  She becomes  tearful on exam when nursing home placement is discussed.  SKIN:  Warm and dry.  No jaundice.  HEENT:  Sclerae anicteric.  Oropharyngeal mucosa moist and pink.  No  lesions, erythema or exudate.  NECK:  No lymphadenopathy or thyromegaly.  CHEST:  Lungs are clear to auscultation.  CARDIAC:  Exam reveals regular rate and rhythm.  Normal S1 and S2.  No  murmurs, rubs, or gallops.  ABDOMEN:  Positive bowel sounds.  Abdomen is soft, nontender and non-  distended.  No organomegaly or masses.  No rebound tenderness or  guarding.  No abdominal hernias.  EXTREMITIES:  No edema.   LABORATORY DATA:  White count 8100, hemoglobin 10.3, hematocrit 31.6,  MCV 85.7 and platelets 254,000.  Sodium 141, potassium 3.5, BUN 3,  creatinine 0.56, glucose 78, total bilirubin 0.2, alkaline phosphatase  47, AST 16, ALT 13, albumin 2.5, magnesium 1.7.  Phenytoin level less   than 2.5.  Prealbumin 14.3.  A.m. cortisol level 24.1.  TSH was 1.088 in  March 2008.  In February 2008, her iron level was elevated at 166, iron  saturation 69%, ferritin 387, TIBC 241, B12 level 445, folate 526.   IMPRESSION:  The patient is a 35 year old female with systemic lupus  erythematosus, depression, chronic encephalopathy possibly secondary to  systemic lupus erythematosus and history of mental retardation, who  presents with a 20-pound weight loss and poor oral intake.  It is felt  that this may be due to her severe depression and/or mental disorders.  Calorie count is abnormal.  The patient denies any pain associated with  eating or dysphagia.  She also has a history of anemia which has been  stable.  Iron studies in February revealed elevated iron saturations and  ferritin possibly due to supplements.  With regards to her weight loss  and poor oral intake, she is not meeting her  caloric needs; this may be due to her severe depression.  I will discuss  further with Dr. Gala Romney; we may need to consider ruling out organic cause  for the weight loss prior to consideration of percutaneous endoscopic  gastrostomy placement.  Also consider repeating iron studies.      Neil Crouch, P.ABridgette Habermann, M.D.  Electronically Signed    LL/MEDQ  D:  06/30/2006  T:  06/30/2006  Job:  [id number]   cc:   Nat Christen, M.D.   [facility]

## 2010-08-02 NOTE — Op Note (Signed)
NAMELIANI, LEBEOUF             ACCOUNT NO.:  [id number]   MEDICAL RECORD NO.:  [id number]          PATIENT TYPE:  INP   LOCATION:  A311                          FACILITY:  [facility]   PHYSICIAN:  Kofi A. Merlene Laughter, M.D. DATE OF BIRTH:  03-27-1975   DATE OF PROCEDURE:  05/03/2006  DATE OF DISCHARGE:                                PROCEDURE NOTE   PROCEDURE:  Spinal tap.   INDICATIONS FOR PROCEDURE:  A 35 year old with altered mental status and  abnormal MRI findings.   DESCRIPTION OF PROCEDURE:  Informed consent was obtained from the aunt.  The patient was initially placed in a lying position and prepped and  draped, but the interspace between the L3-L4 and L4-L5 levels were nt  accessible after 3-4 attempts.  She was subsequently placed in the  sitting position, and the L4-L5 interspace was accessed on the second  attempt.  The fluid was traumatic but did clear and sent for appropriate  labs.  She tolerated the procedure well.      Kofi A. Merlene Laughter, M.D.  Electronically Signed     KAD/MEDQ  D:  05/03/2006  T:  05/03/2006  Job:  [id number]

## 2010-08-02 NOTE — Group Therapy Note (Signed)
[name], FRANCIS             ACCOUNT NO.:  [id number]   MEDICAL RECORD NO.:  [id number]          PATIENT TYPE:  INP   LOCATION:  A311                          FACILITY:  [facility]   PHYSICIAN:  Kofi A. Merlene Laughter, M.D. DATE OF BIRTH:  [birth year]   DATE OF PROCEDURE:  05/02/2006  DATE OF DISCHARGE:                                 PROGRESS NOTE   I did get a chance to speak with the patient's aunt today.  It appears  the patient relocated from the [place] area about three years ago and  has been staying with her mother in the [place] area.  The aunt  indicated that the patient has been on disability for a few years due to  her medical problems and has essentially not worked.  However, she has  apparently been relatively intact cognitively until last month,  essentially since she was hospitalized at [facility] for a  seizure.  Since then it appears that she has had a persistent impaired  cognition characterized by nonsensical speech and confusion.   PHYSICAL EXAMINATION:  GENERAL:  The patient on exam today seems  essentially unchanged from her initial presentation.  She is awake and  alert but has poor insight into her medical condition, sometimes has  some nonsensical speech.  NEUROLOGIC:  She has good strength of the upper and lower extremities  and no focal deficits.   ASSESSMENT/PLAN:  Subacute encephalopathy.  She does have extensive  white matter hypersensitivity on diffusion imaging but not on flare  imaging or T2 or T1 imaging.  The suspicion is that this is due to lupus  vasculopathy.  She has been started on a bolus of steroid.  I think we  will continue this.  I think we probably should do a spinal tap to rule  out for any other processes.  The aunt has agreed with this and  therefore we will proceed with this.      Kofi A. Merlene Laughter, M.D.  Electronically Signed     KAD/MEDQ  D:  05/02/2006  T:  05/02/2006  Job:  [id number]

## 2010-08-02 NOTE — Procedures (Signed)
NAMELARONA, SHEFFLER NO.:  [id number]   MEDICAL RECORD NO.:  [id number]          PATIENT TYPE:  INP   LOCATION:  A204                          FACILITY:  [facility]   PHYSICIAN:  Scarlett Presto, M.D.   DATE OF BIRTH:  December 03, 1975   DATE OF PROCEDURE:  01/30/2005  DATE OF DISCHARGE:                                  ECHOCARDIOGRAM   TAPE NUMBER:  [id number].  Tape count 0-553.   INDICATIONS:  This is a woman with dehydration and question of sepsis who  has lupus.   Technical quality study is adequate.   M-MODE TRACINGS:  Aorta is 24 mm.   Left atrium is 40 mm.   Septum is 10 mm.   Posterior wall 10 mm.   Left ventricular diastolic dimension 45 mm.   Left ventricular systolic dimension 34 mm.   Two-dimensional echocardiogram and Doppler imaging:  The left ventricle is  normal size with normal systolic function.  No wall motion abnormalities  were seen.  The overall ejection fraction is 55-60%.   Right ventricle is normal size with normal systolic function.   Both atria are top normal in size.   The aortic valve is morphologically unremarkable with no stenosis or  regurgitation.   Mitral valve has trace regurgitation.   Tricuspid valve has trace regurgitation.   Pulmonic valve has trace regurgitation.   No pericardial effusion.   The inferior vena cava is normal size.      Scarlett Presto, M.D.  Electronically Signed     JH/MEDQ  D:  01/30/2005  T:  01/30/2005  Job:  [id number]

## 2010-08-02 NOTE — Discharge Summary (Signed)
[name], VANDENBOSSCHE NO.:  [id number]   MEDICAL RECORD NO.:  [id number]          PATIENT TYPE:  INP   LOCATION:  A204                          FACILITY:  [facility]   PHYSICIAN:  Karlyn Agee, M.D. DATE OF BIRTH:  02-19-1976   DATE OF ADMISSION:  01/28/2005  DATE OF DISCHARGE:  11/17/2006LH                                 DISCHARGE SUMMARY   PRIMARY CARE PHYSICIAN:  Unassigned.  Assigned to Dr. Orson Ape   DISCHARGE DIAGNOSES:  1.  Sepsis syndrome, resolved.  2.  Systemic lupus erythematosus flare.  3.  Lupus nephritis with marked proteinuria.  4.  Lupus arthritis.  5.  Severe dehydration.  6.  Hypertension much improved.  7.  B12 deficiency.  8.  Anemia of chronic disease.  9.  Neuropathic pain, likely related to lupus.  10. Oral Candida.   DISPOSITION:  Discharged to home.   DISCHARGE CONDITION:  Stable.   DISCHARGE MEDICATIONS:  1.  Prednisone 50 mg daily for 4 days, then 40 mg daily for 7 days, then 30      mg daily until seen by Dr. Justine Null.  2.  Diflucan 100 mg daily for 3 weeks.  3.  Neurontin 600 mg three times daily.  4.  Levaquin 750 mg daily for 5 days.  5.  Lopressor 25 mg twice daily.  6.  Lortab 5/500 one tablet q.4 h. when necessary.  7.  Cyanocobalamin 2000 mg daily.  8.  Iron sulfate 325 mg three times daily.  9.  Aranesp 100 mcg subcu weekly.   FOLLOWUP:  The patient is to be followed up by Dr. Justine Null, the rheumatologist  in [place], on 02/19/05.  She is to make an appointment to see her  assigned primary care physician, Dr. Orson Ape.  The patient has been provided  with home health to resume physical therapy and to monitor her blood  pressures.  She has been advised to stop the Lopressor if her systolic blood  pressure falls below 100.   HOSPITAL COURSE:  Please refer to the admission history and physical  dictated on the day of discharge.  This is a 35 year old African-American  lady with known systemic lupus erythematosus who  recently returned to the  area after living in [place].  She was seen at this facility less than a  month ago, treated for lupus flare, and discharged home with an appointment  to see Dr. Justine Null on 01/27/05.  For some reason she was unable to keep that  appointment and returned to the emergency room on 01/28/05, in almost  prostate, very weak and confused, tachycardic, hypertensive, and dehydrated.  The patient was admitted, hydrated vigorously.  Because she had a  leukocytosis and a fever over 101, she was cultured and started on  antibiotics.  Because she had joint pains and was noted to have a microcytic  anemia with a normal red cell count and target cells, she was also  investigated for possible hemoglobinopathy.  She has a family history of  chronic anemia.  She also had a full anemia workup with the exception of a  bone marrow,  and she also had the benefit of a consult with the hematologist  and a consult with the cardiologist.   The patient's mental status improved considerably with hydration and high-  dose intravenous steroids.  Her fevers settled with introduction of  antibiotics and it is not clear if she actually did have an infection since  she also received steroids and hydration.  Her tachycardia and hypertension  improved with hydration and institution of beta blocker.  She had the  benefit of a 2-D echo which was reviewed by the cardiologist and was  essentially normal.  There was no evidence of valvular disease.   She had a repeat anemia workup which was done during her previous admission.   Once again, her B12 level was found to be low and she was started on B12  replacement intramuscularly in the hospital and she will be continued on  oral vitamin B12 as an outpatient.  Her iron levels were found once again to  be low at this time despite the fact that she was already on iron  supplementation.  Her serum ferritin was 70 and she has been started on  Aranesp, as well as  continued on iron preparation.   Although her joint pains resolved somewhat with Dilaudid, she complained of  shooting-like neuropathic type pains moving down the left leg and her  Neurontin dose was increased, and has also developed swelling and effusion  of the knees, Right greater than Left.   This morning the patient is alert and oriented, apart from the pains in her  leg and the weakness.  Her temperature is 96.9, pulse 90, respirations 20,  blood pressure 129/73.  Her weight is recorded as 114.7 pounds.  Her oral  candida is much improved.   Her white count is 19.4, hemoglobin 8.6, MCV 71.9, platelets 475.  Her retic  count was normal at 2.3, absolute retic count normal at 97.  Her PT, PTT,  and INR are normal.  Her sodium is 133, potassium 4.2, chloride 106, CO2 22,  glucose 151, BUN 17, creatinine 0.8.  Her calcium is 8.2.  Her LDH was 132,  which is normal.  Her liver function tests were unremarkable.  Her serum  iron was 14, TIBC 230, 6% saturation.  Her serum folate was 9.5, serum  ferritin was 79, and B12 level was 193.  Her homocysteine was 12.4.  Her C-  reactive protein was 6.3 on admission.  Her ESR was 12 on admission.   She had a 24-hour urine collection in which she produced 1225 mL of urine in  24 hours, and it contained 4631 mg of protein.  Urinalysis contained over  300 protein but no red cells.   X-rays of her shoulder and hip girdle revealed no evidence of  necrosis or  degeneration.  Abdominal ultrasound revealed a contracted gallbladder with  multiple small gallstones but liver and spleen were unremarkable.     Karlyn Agee, M.D.  Electronically Signed    LC/MEDQ  D:  01/31/2005  T:  01/31/2005  Job:  [id number]

## 2010-08-02 NOTE — Discharge Summary (Signed)
[name]:  [id number]   MEDICAL RECORD NO.:  [id number]          PATIENT TYPE:  INP   LOCATION:  A328                          FACILITY:  [facility]   PHYSICIAN:  Salem Caster, DO    DATE OF BIRTH:  [birth year]   DATE OF ADMISSION:  04/19/2006  DATE OF DISCHARGE:  02/07/2008LH                               DISCHARGE SUMMARY   DISCHARGE DIAGNOSES:  1. Acute flare of systemic lupus erythematosus.  2. Altered mental status change.  3. Seizure disorder.  4. Chronic anemia.  5. History of mental retardation.   BRIEF HOSPITAL COURSE:  [name] is a 35 year old African American  female who presented with a long history of lupus.  The patient was  recently discharged from [facility] on April 14, 2006  with new onset of seizures.  At [facility], she was placed on a load of  Dilantin, seen by neurology, had EEG, MRI, which were all within normal  limits.  The patient was told at that time to follow up with her primary  care physician in [place] and then start back on her Plaquenil.  Apparently, the patient did not follow up with the physician, and on the  day of admission here, just recently moved back to [place] with her  aunt.  The patient has been seen multiple times with similar flare of  her lupus.  The patient presented on admission with mental status  changes and complaining of body aches.  The patient does have a history  of mental retardation, and the patient was apparently seen by a  rheumatologist back in 2006, but never followed up with the  rheumatologist at that time.  The patient was admitted to the [facility].  She was placed on IV steroids, as well as IV Ativan as needed  for any seizure activity.  IV steroids were changed over to p.o.  Neurology was consulted, and the patient was placed on seizure  precautions.  The patient has had no seizures since admission, and  neurology consult was obtained, and her  Dilantin dose was increased.  The patient did have a CT of her head which showed no acute intracranial  findings and some probable atrophy.  The patient had a chest x-ray which  showed no acute disease.  The patient at this time says she is feeling  better, decreased body aches, and is stable enough for discharge.  The  patient's vitals have remained stable throughout her hospital stay.  The  patient did have blood cultures which have shown no growth.   DISCHARGE CONDITION:  Stable.   The patient will be discharged to home.   MEDICATIONS:  1. Ferrous sulfate 325 mg daily.  2. Lisinopril 5 mg daily.  3. Dilantin 300 mg p.o. at bedtime.  4. Prednisone taper 40 mg p.o. for 4 more days, followed by 20 mg for      7 days, then 10 mg until seen by rheumatologist.  5. Hydrocodone/acetaminophen 1 tab p.o. p.r.n.  6. Neurontin 100 mg daily.   DIET:  No restrictions.   The patient  is to follow up with her primary care physician as well as  rheumatologist for an appointment in the next 1-2 weeks.   PHYSICAL ACTIVITIES:  Increase activity slowly.   The patient is instructed on the importance of taking medications as  directed and following up with the above physicians in the near future.  Patient instructed to return to the ER if any complications or  increasing concerns or complaints.      Salem Caster, DO  Electronically Signed     SM/MEDQ  D:  04/23/2006  T:  04/23/2006  Job:  [id number]

## 2010-08-02 NOTE — Discharge Summary (Signed)
[name], [name]             ACCOUNT NO.:  [id number]   MEDICAL RECORD NO.:  [id number]          PATIENT TYPE:  INP   LOCATION:  A340                          FACILITY:  [facility]   PHYSICIAN:  Merry Lofty, MD   DATE OF BIRTH:  [birth year]   DATE OF ADMISSION:  05/15/2006  DATE OF DISCHARGE:  [place] [facility]                               DISCHARGE SUMMARY   [name] is a 35 year old female patient with a history of SLE and  seizures, anemia and history of mental retardation and depression, who  presented on May 15, 2006 with tachycardia and hypotension and was  admitted with assessment of dehydration. She was put on IV fluids. She  slowly got rehydrated over days and had good urine output. The pulse  rate normalized and her blood pressure also remained stable. Despite the  improvement in blood pressure and heart rate, she remained bedridden and  severely deconditioned and unable to walk alone without support.  Physical therapy was consulted to help her ambulate. They tried but  still she remained very limited and she is severely depressed, always  sleeping with less appetite and uncooperative in taking her meals  regularly and the physical therapy team was called to evaluate her and  it was recommended that she have follow-up as an outpatient rather than  inpatient treatment.  The other issue was whether to send her home or to  resting facility. I recommended it would be better to send her to a  resting facility to have proper help, physical therapy as well as  nutritional support but she declined as well as her aunt who lives with  her. I talked to her and she declined as well.  Investigation was done  during her hospital stay on top of the daily chemistries and CBC.  Chest  x-ray was done which shows no acute disease and an echocardiogram was  also done which showed basically no abnormality with a normal ejection  fraction. Stool guaiac was negative and thyroid stimulating hormone  level was within normal range.   PHYSICAL EXAMINATION TODAY:  GENERAL: She is lying on the bed without  any respiratory distress.  VITAL SIGNS: Within normal range. Blood pressure is 114/74, temperature  97.7, pulse is 98, respiratory rate is 20 per minute and O2 saturation  is 99%.  HEENT: She has slight pale conjunctivae and non icteric sclerae. Buccal  mucosa is moist.  NECK: Supple, no lymphadenopathy.  CHEST: She has good air entry bilaterally.  CARDIAC: S1, S2 regular, no murmur.  ABDOMEN: Soft without tenderness.  EXTREMITIES: No pedal edema.  CNS: She is alert and oriented but very slow to respond and depressed.   The last lab we have on May 20, 2006: CBC shows white blood cells 4.6  thousand, hemoglobin is 10.3, hematocrit 30.6, MCV is 86, platelet count  is 340,000.  Chemistries on May 17, 2006 were basically normal.   ASSESSMENT AT DISCHARGE:  1. Dehydration. The patient presented with hypotension and tachycardia      which are well resolved.  2. Symptomatic lupus erythematosus which is stable; she will continue  on prednisone.  3. Depression. She is currently on medication but needs follow-up by a      psychiatrist.  4. Seizure disorder. She is on Dilantin and will follow-up with Dr.      Merlene Laughter as well.  5. Mental retardation. Stable.  6. Chronic anemia probably related to symptomatic lupus erythematosus      as well as malnutrition.  7. She is undernourished. She has a very poor appetite and depression      is contributing as well.   HOME MEDICATIONS:  1. Folic acid 1 mg p.o. daily.  2. Multivitamin one tablet b.i.d.  3. Lisinopril 20 mg p.o. daily.  4. Paxil 20 mg p.o. daily.  5. Prednisone 20 mg p.o. daily.  6. Ferrous-sulfate 325 mg p.o. b.i.d.  7. Neurontin 300 mg p.o. at h.s.  8. Dilantin 200 mg p.o. daily.   PLAN:  The plan is to discharge her home with her medication and to have  follow-up with the neurologist and with her primary care  physician as  well as a psychiatrist. [facility] physical therapist and a  visiting nurse also recommended at home to help her ambulate and take  care of herself.      Merry Lofty, MD  Electronically Signed     MT/MEDQ  D:  05/22/2006  T:  05/22/2006  Job:  [id number]   cc:   Trey Sailors A. Merlene Laughter, M.D.  Fax: [phone]

## 2010-08-02 NOTE — H&P (Signed)
[name], [name]:  [id number]   MEDICAL RECORD NO.:  [id number]          PATIENT TYPE:  INP   LOCATION:  A311                          FACILITY:  [facility]   PHYSICIAN:  Erica Haff, MD     DATE OF BIRTH:  26-Dec-1975   DATE OF ADMISSION:  04/29/2006  DATE OF DISCHARGE:  [facility]                              HISTORY & PHYSICAL   Patient is supposed to follow up with Dr. Truett [name], who is supposed to be  her primary care physician.  She also sees a rheumatologist in [place].   Patient was discharged on February 7th from this hospital after being  managed for possible lupus flare and mental status changes.   ADMISSION DIAGNOSES:  1. Altered mental status of unclear etiology.  2. Transient chest pain, unclear etiology.  3. History of systemic lupus erythematosus.  4. History of seizure disorder.  5. Iron-deficiency anemia.   CHIEF COMPLAINT:  Unclear.   HISTORY OF PRESENT ILLNESS:  Patient is a 35 year old African-American  female who has SLE and was diagnosed with a seizure disorder just a few  weeks ago at [facility] and was put on Dilantin.  Patient presented to the  ED and was actually brought in by the EMS for complaints of chest pain.  By the time she got into the ED, the pain had resolved.  Currently,  unfortunately, the patient is not able to give me any history.  She is  awake and alert, but she does not know why she is in the hospital.  She  is very confused.  She states that she has moved from [place] recently;  however, she has been living in [place] for at least a few months to  maybe a couple of years.  She denies any pain or discomfort at this  time.  She denies any fever, chills, nausea, vomiting, diarrhea,  abdominal pain.  Denied any dysuria.  Denied any recent falls.  No  headaches.  Basically, the patient is not offering too much history.  Based on some of her previous records, it appears that she has a history  of mental retardation.   Medications at home, based on discharge summary, discharged by Dr.  Sherryle [name], it appears that she was on prednisone taper, Neurontin,  ferrous sulfate, lisinopril, hydrocodone for pain as needed, Dilantin  300 mg nightly.   ALLERGIES:  She has no known drug allergies, based on previous records.   PAST MEDICAL HISTORY:  History of SLE for many years.  Recently  diagnosed with seizure disorder at [facility] and  was started on Dilantin.  She was seen by Dr. Merlene [name] the last time she  was admitted, and he recommended increasing the dose of Dilantin.  She  also has a history of iron-deficiency anemia.  No other history is  elicited at this time.   SOCIAL HISTORY:  Patient mentioned that she lives alone; however, am not  very sure.  Her history is not very accurate.  There is no history  suggestive of smoking or drinking use.   FAMILY HISTORY:  Unobtainable at this time.  REVIEW OF SYSTEMS:  Could not be done because of patient's confusion.   PHYSICAL EXAMINATION:  VITAL SIGNS:  When she presented to the ED, she  had a temperature of 97.8, blood pressure 122/88, heart rate in the  130s, respiratory rate 16, saturation 100%.  Her heart rate now is about  105, otherwise vital signs are stable.  GENERAL:  This is a thin African-American female in no distress, mildly  confused but no agitation is noted.  HEENT:  There is no pallor, no icterus.  Oral mucous membranes are  moist.  No oral lesions are noted.  NECK:  Soft and supple with full range of motion.  CARDIOVASCULAR:  S1 and S2 is regular and normal.  No murmurs  appreciated.  No S3 or S4.  LUNGS:  Clear to auscultation bilaterally with reduced air entry at the  bases.  ABDOMEN:  Soft, nontender, nondistended.  Bowel sounds are present.  No  mass or organomegaly is appreciated.  EXTREMITIES:  Without edema.  NEUROLOGIC:  Patient is alert but disoriented.  She does not know where  she is.  She does not know  the date or time; however, she does know her  name.  Cranial nerves seem intact.  Tone seems intact, upper and lower  extremities.  Motor strength:  There is mild weakness, probably about  4/5 strength in the right lower extremity but has 5/5 in the left lower.  Both upper extremities have 5/5 strength.  Cerebellar signs cannot be  elicited because of lack of patient cooperation.  Really no other focal  deficits are appreciated.   LAB DATA:  White count was 13,700, hemoglobin 13.3, platelet count 470,  85% neutrophils.  BMET was unremarkable.  Ammonia level is normal.  Urine tox screen was unremarkable.  Fenytoin level was 24.8, mildly  elevated.  UA was unremarkable.   Chest x-ray was done, which did not show any acute process.   Other imaging studies done earlier include an MRI done on February 8,  which showed a lot of white matter changes but no acute abnormalities.   ASSESSMENT:  This is a 35 year old African-American female with systemic  lupus erythematosus who was recently diagnosed with possible seizure  disorder, who presents with mental status changes.  She is noted to have  a history of mental retardation based on some of these previous records,  but when she was examined back in 2006, she was oriented.  I think  patient likely has acute encephalopathy for unclear reasons.  Some of  this could be related to medication.  It could also be related to her  lupus.  I wonder if this patient needs to have a lumbar puncture done at  some point.  Could she be having unwitnessed seizures causing her  encephalopathy?  Yes, it is a possibility which also needs to be  entertained.  The chest pain was transient.  She was very tachycardic  when she came in; hence, we will also do a preliminary workup for this.   PLAN:  1. Acute encephalopathy of unclear etiology.  We will reconsult      neurologist to see this patient.  I will also order an EEG on this     individual.  She has already  had an MRI recently; hence, we will      not proceed in that direction.  I think the patient may need to      have a lumbar puncture at some point.  Will continue the Dilantin      although would skip tonight's dose because of a high level, and we      will recheck her Dilantin level tomorrow.  2. Transient chest pain of unclear etiology:  We will take a D-dimer,      one set of cardiac markers, and an EKG for this individual.      Patient is at a risk for thromboembolic events because of her      lupus.  3. History of systemic lupus erythematosus:  Continue prednisone taper      that was ongoing.  Patient is supposed to be on Plaquenil after      discussions with her rheumatologist.  I will not start her on this      medication for now.  4. Deep venous thrombosis/gastrointestinal prophylaxis will be      initiated.  5. Continue all of her other medications as before.  6. PT evaluation will be obtained.  For now, we will leave the Foley      in.  7. According to the ED note, she also had some fecal incontinence in      the emergency department,  We will continue to monitor her for that      for now.  If she continues to have evidence for diarrhea or      incontinence, stool studies will be sent, and GI consult may be      obtained.   Further management decision will be based on the results of initial  testing and patient's response to treatment.      Erica Haff, MD  Electronically Signed     GK/MEDQ  D:  04/29/2006  T:  04/29/2006  Job:  [id number]   cc:   Trey Sailors A. [name], M.D.  Fax: [phone]

## 2010-08-02 NOTE — Group Therapy Note (Signed)
[name], [name]             ACCOUNT NO.:  [id number]   MEDICAL RECORD NO.:  [id number]          PATIENT TYPE:  INP   LOCATION:  A311                          FACILITY:  [facility]   PHYSICIAN:  Kofi A. Merlene Laughter, M.D. DATE OF BIRTH:  [birth year]   DATE OF PROCEDURE:  05/04/2006  DATE OF DISCHARGE:                                 PROGRESS NOTE   The patient has no new complaints today.  She is awake and alert.  She  seems slightly more responsive, but still not oriented totally. She  thinks she is in the hospital at [facility]. She does follow commands  well.  She has normal strength in the upper extremities.  Her legs are  4/5.  Reflexes are diminished significantly in the legs; however spinal  fluid analyses show wbc of 7, rbc 18, glucose 72, protein 59,  Cryptococcal antigen negative.   ASSESSMENT AND PLAN:  1. Subacute encephalopathy, of unknown etiology.  The spinal fluid      analyses seemed to suggest mild leukocytosis; possibly due to      lupus.  She has other things pending, such as herpes PCRA and the      JC virus VCR.  We should continue with the 3-day course of steroid      bolus and subsequently wean.  2. Seizure disorder.  Dilantin level was slightly high.  This is      probably fine at a 23 range.  This likely does not contribute to      her altered mental status state.  3. I think we may consider tertiary care input to assess this subacute      encephalopathy issue.  This could be done in an outpatient setting      once discharged.      Kofi A. Merlene Laughter, M.D.  Electronically Signed     KAD/MEDQ  D:  05/04/2006  T:  05/04/2006  Job:  [id number]

## 2010-08-02 NOTE — Consult Note (Signed)
[name], SPRUNG             ACCOUNT NO.:  [id number]   MEDICAL RECORD NO.:  [id number]          PATIENT TYPE:  INP   LOCATION:  A328                          FACILITY:  [facility]   PHYSICIAN:  Kofi A. Merlene Laughter, M.D. DATE OF BIRTH:  03/30/1975   DATE OF CONSULTATION:  04/21/2006  DATE OF DISCHARGE:                                 CONSULTATION   REASON FOR CONSULTATION:  This is a 35 year old [place] female,  who has a long history of lupus.  She was recently admitted to the  [facility] for new onset seizures.  She apparently had an MRI and  electroencephalogram, both of which were unrevealing.  She was started  on Dilantin.  The patient apparently has had flare ups in the past with  some alteration of mentation, with the most recent one probably being  one year ago.  The patient presented to the hospital, apparently taken  here by her aunt, with alteration of mental status and pain all over.  She probably has a baseline history of mental retardation.  No reports  of fevers are reported.  No joint aches, despite the complaint of  hurting all over.  No headaches are reported.   PAST MEDICAL HISTORY:  1. Systemic lupus.  2. New onset seizures.   ADMISSION MEDICATIONS:  1. Dilantin 200 mg.  2. Gabapentin 10 mg.  3. Iron sulfate 325 mg.   ALLERGIES:  No known drug allergies.   SOCIAL HISTORY:  No alcohol, tobacco or illicit drug use.   FAMILY HISTORY:  Positive for coronary artery disease and hypertension.   REVIEW OF SYSTEMS:  Unremarkable other than stated in the history of  present illness.   PHYSICAL EXAMINATION:  GENERAL:  A thin, somewhat chronically ill-  appearing female, in no acute distress.  VITAL SIGNS:  She is afebrile.  Temperature 98.6 degrees, pulse 108,  respirations 20, blood pressure 120/83.  HEENT/NECK:  Neck is supple.  Head is normocephalic and atraumatic.  ABDOMEN:  Soft.  EXTREMITIES:  No significant varicosities or edema.  There is  some mild  swelling, non-pitting, involving the upper extremities, particularly the  hands and feet.  NEUROLOGIC:  Mentation:  She is somewhat slow cognitively-wise,  indicating some bradyphrenia.  She does follow commands.  Speech is  normal.  Language is also normal.  No dysarthria is noted.  She is  oriented x2, however.  Cranial nerve evaluation:  Pupils equal, round,  reactive to light and accommodation.  Extraocular movements full.  Visual fields are intact.  Facial muscle strength si symmetric.  Tongue  is midline.  Uvula is also midline.  Shoulder shrugs are normal.  Motor  examination shows mild global weakness 4+/5.  Bulk and tone are normal,  however,  Reflexes are normal with plantar reflexes downgoing.  Sensation is normal to temperature and light touch.  Coordination is  normal.   A head CT scan shows nothing acute.  A chest x-ray is also negative.   LABORATORY DATA:  WBC 7, hemoglobin 10, platelet count 462, neutrophil  81%, sedimentation rate 9.  Sodium 139, potassium 3.8, chloride 110,  glucose 120, BUN 1, creatinine 0.6.  Alkaline phosphatase 62, AST 75,  ALT 100, calcium 9.1, ammonia 15, protein 5, albumin 3.2.  TSH 0.7.  Admission phenantoin level 10, repeat 4.2.  Urine drug screen positive  for metabolites of barbiturates.  Urinalysis negative.   ASSESSMENT:  Acute encephalopathy in a patient who probably has a  baseline cognitive impairment/mental retardation.  The etiology of the  acute encephalopathy is unknown at this time, but she appears to have  improved, which may point to an un-witnessed seizure event as the  explanation.  She certainly could have acute changes relative to her  lupus.  Medication effect also has been raised.   RECOMMENDATIONS:  I would suggest that the Dilantin be increased to 300  mg, and the use of the brand name only, to reduce the risk of erratic  absorption.  Will also continue with serial neurological evaluations.   Thank you for  this consultation.      Kofi A. Merlene Laughter, M.D.  Electronically Signed     KAD/MEDQ  D:  04/21/2006  T:  04/21/2006  Job:  [id number]

## 2010-08-02 NOTE — H&P (Signed)
NAMEBRUCE, MARTINDALE NO.:  [id number]   MEDICAL RECORD NO.:  [id number]          PATIENT TYPE:  INP   LOCATION:  A212                          FACILITY:  [facility]   PHYSICIAN:  Bonnielee Haff, MD     DATE OF BIRTH:  15-Apr-1975   DATE OF ADMISSION:  06/25/2006  DATE OF DISCHARGE:  [facility]                              HISTORY & PHYSICAL   PRIMARY CARE PHYSICIAN:  Dr. Nat Christen   ADMISSION DIAGNOSES:  1. Dehydration with hypotension and tachycardia.  2. History of poor p.o. intake.  3. History of weight loss of over 20 pounds in the last 1 or 2 months.  4. History of SLE.  5. History of chronic encephalopathy, possibly secondary to SLE.  6. History of seizure disorder, but apparently no longer on dilantin.  7. History of iron deficiency anemia.  8. History of possible mental retardation.   CHIEF COMPLAINT:  Dehydration and hypotension.   HISTORY OF PRESENT ILLNESS:  The patient is a 35 year old African-  American female who has SLE, possibly chronic encephalopathy secondary  to SLE who was last admitted back in February for dehydration,  tachycardia and hypotension.  The patient was in Dr. Truett Mainland' office  earlier today and she was again found to be hypotensive and tachycardic,  though she was not symptomatic with this.  The patient was sent in to  the ED for further workup and evaluation.  In the ED she was noted to  have a blood pressure initially 86/65 with a heart rate of 120.  This  improved to 92/56 with a heart rate of 110.  She was given some IV  hydration.  The patient is completely asymptomatic at this point, though  she admits to feeling weak.  The patient is actually tearful and says  that she wants to be close to her family.   I was informed by Dr. Truett Mainland that patient has lost a lot of weight over  the past couple of months.  Apparently her family is not able to take  care of her at home.  She has not had good p.o. intake over the past  many weeks to  months as well.  Dr. Truett Mainland wants the patient to be  evaluated for a PEG tube because of this situation.  She also wanted the  patient to be placed in a nursing home because of poor and unsafe home  environment.   MEDICATIONS AT HOME:  Paroxetine 20 mg daily.  Mirtazapine 15 mg at bedtime.  Prednisone 30 mg once a day.  Neurontin 100 mg once a day.  Lortab as needed.  Folic acid 1 mg once a day.   She is also supposed to be on ferrous sulfate, multivitamins.  She is  supposed to be on dilantin, although it says that this is a medication  that has been discontinued but I am not quite sure.   ALLERGIES:  No known drug allergies.   PAST MEDICAL HISTORY:  Consists of SLE for many years, seizure disorder  on dilantin, history of mental retardation, iron deficiency anemia,  encephalopathy thought to  be secondary to SLE.   SOCIAL HISTORY:  Lives with her aunt.  Does not smoke cigarettes.  No  alcohol consumption.  No illicit drug use.   FAMILY HISTORY:  Unobtainable.   REVIEW OF SYSTEMS:  Difficult to do on this patient who does not answer  consistently, but she is denying any major symptoms.   PHYSICAL EXAMINATION:  VITAL SIGNS:  Temperature 97.6.  Blood pressure  92/56, initially was 86/65.  Saturation 100%.  GENERAL EXAM:  This is a thin female, crying at this time, wanting to go  home.  HEENT:  There is no pallor, no icterus.  Oral mucous membranes dry, no  oral lesions are noted.  NECK is soft and supple.  No thyromegaly is appreciated.  CARDIOVASCULAR:  S1, S2, slightly tachycardic, regular, no murmurs  appreciated.  ABDOMEN is soft, nontender, nondistended.  Bowel sounds are present.  No  masses or organomegaly is appreciated.  EXTREMITIES:  Without edema.  Peripheral pulses are palpable.  NEUROLOGICAL:  Patient is alert, not fully oriented, but no focal  neurological deficits are present.   LABORATORY DATA:  Her CBC reveals a hemoglobin of 11.4; otherwise  unremarkable.   D-dimer is normal.  BMET reveals a BUN of 5.   No imaging studies have been done.   ASSESSMENT:  This is a 35 year old African-American female with medical  problems as stated earlier who was sent by her PMD for dehydration,  hypotension and tachycardia.  I was contacted by Dr. Truett Mainland because this  patient apparently is not doing well at home.  She does have family but  they are not there all the time and as a result patient does not eat or  drink.  She has also lost a significant amount of weight.  Home safety  is a problem at this time.  I note that she was offered nursing home  placement during the last admission and she refused and her aunt also  refused, but apparently these situations have changed now.  I was  informed by Dr. Truett Mainland that her aunt is agreeable to nursing home  placement.  Nutrition is also an issue and Dr. Truett Mainland wants this patient  evaluated for a PEG tube placement.  Venous access is also an issue at  this point.   PLAN:  1. Dehydration with hypotension.  We will give IV fluids, monitor on      telemetry.  She is on chronic steroids, hence adrenal insufficiency      needs to be considered and we will check a cortisol level in the      morning.  No suspicion of any sepsis at this point.  Will go ahead      and check a UA on her.  If needed, pressors will be used.  2. Poor p.o. intake.  Dr. Truett Mainland wants this patient evaluated for PEG      tube.  We will do a calorie count, have a nutritionist see her, see      what her caloric intake is over the next few days, and then we will      consult GI to consider this patient for PEG tube if she does seem      to need it.  Her albumin was 3.7 back in February of this year.  We      will check a pre-albumin level as well tomorrow morning.  3. SLE with encephalopathy.  This is chronic.  Continue prednisone.  She is not on any immunosuppressant at this time.  I think patient     probably has not followed up with her  rheumatologist.  We will      continue the prednisone.  4. I will withhold her antihypertensive agents.  Continue the rest of      her medications.  5. Seizure disorder.  She is supposed to be on dilantin, but I am      unsure if she continues to be on it at this point or not.  We will      check a dilantin and obtain a medication list from her PMD's      office.  6. Venous access is also an issue.  She has very poor veins and this      is always a problem when she comes in.  She may be a candidate for      Port-A-Cath placement.  We will consult General Surgery for this.  7. Social Work will be consulted to find a place for this patient as      it appears that she is no longer safe to go back home.  8. DVT/GI prophylaxis has been initiated.   Further management decisions will be based on results of initial testing  and patient's response to treatment.      Bonnielee Haff, MD  Electronically Signed     GK/MEDQ  D:  06/25/2006  T:  06/25/2006  Job:  [id number]   cc:   Nat Christen, M.D.

## 2010-08-02 NOTE — H&P (Signed)
NAMEIVAN, BENNE NO.:  [id number]   MEDICAL RECORD NO.:  [id number]          PATIENT TYPE:  OBV   LOCATION:  A204                          FACILITY:  [facility]   PHYSICIAN:  Karlyn Agee, M.D. DATE OF BIRTH:  1975-12-15   DATE OF ADMISSION:  01/28/2005  DATE OF DISCHARGE:  LH                                HISTORY & PHYSICAL   PRIMARY CARE PHYSICIAN:  Unassigned.   CHIEF COMPLAINT:  Pain all over, feeling weak.   HISTORY OF PRESENT ILLNESS:  This is a 35 year old African American lady  with a history of SLE who was discharged from this facility, on December 30, 2004, after treatment of an acute flare.  The patient at that time was  scheduled to follow up with Dr. Orson Ape as a primary care physician and with  Dr. Justine Null in [place] as a rheumatologist but the patient has not yet seen  these physicians and comes back to the emergency room today complaining of  the above symptoms for the past few days.  The patient denies fever, sore  throat, chest pain, or shortness of breath but says this is how she usually  feels when she is entering a flare of her lupus.  The patient states she has  been eating and drinking okay but is feeling very thirsty.  She has had no  nausea, vomiting, diarrhea.  She in particular has been having pain in the  right upper extremity and the left lower extremity.   PAST MEDICAL HISTORY:  1.  SLE.  2.  Left foot pain.  3.  Chronic anemia with iron deficiency.   MEDICATIONS:  Hydrocodone acetaminophen, prednisone, ferrous sulfate,  omeprazole, gabapentin, Combivent.   ALLERGIES:  No known drug allergies.   SOCIAL HISTORY:  She is a single mother of two children.  Denies tobacco,  alcohol, or illicit drug use.  Recently relocated from [place] in October  and has no local physicians.   FAMILY HISTORY:  Significant for chronic anemia of unknown cause.   REVIEW OF SYSTEMS:  Other than noted above, a 10-point review of systems was  negative.   PHYSICAL EXAMINATION:  GENERAL:  This is a very weak and ill-looking African  American lady lying in bed.  VITAL SIGNS:  Her temp is 101.  Her pulse is 147, respirations 20, blood  pressure 157/105.  She is saturating at 100% on room air.  HEENT:  Her pupils are round, equal, and reactive.  Her mucous membranes are  dry.  Her oral cavity, there appears that there is a possibility of  Candidiasis but it may be food.  On her scalp, she has marked alopecia  covered by a wig.  She has marked malar rash but it does not appear to be an  acute flare.  There is no jugular venous distention, no thyromegaly, no  cervical lymphadenopathy.  CHEST:  Clear to auscultation throughout. She is not tachypneic.  CARDIOVASCULAR:  She is tachycardic.  ABDOMEN:  Soft but she does complain of generalized tenderness and it is  somewhat distended.  EXTREMITIES:  She has a trace  edema bilaterally.  She has 2+ pulses  bilaterally.  She has no bone or joint deformities.   LABORATORY DATA:  Her white count is 23.5.  Her hemoglobin is 10.5.  Her MCV  is 72.7.  Her platelets are 639 with 90% neutrophils, absolute neutrophil  count is 21.2.  Her RBC morphology shows polychromasia, 2-5 schistocytes,  target cells are present and ESR is only 12.  Her coags are normal.  Her  sodium is 134, potassium 3.8, chloride 101, CO2 26, glucose 108, BUN 13,  creatinine 0.8.  Total bilirubin is 0.4.  Liver function tests are  essentially normal.  Total protein is 6.1, albumin is 2.7, calcium is 8.2.   ASSESSMENT:  1.  Acute flare of systemic lupus erythematosus by history.  The patient was      admitted for a similar episode a year ago.  2.  Chronic microcytic anemia with a normal RBC count with a family history      of chronic anemia.  The patient possibly has thalassemia in view of the      description of target cells also.  3.  History of iron-deficiency anemia.  4.  Dehydration as evidenced by the tachycardia.   5.  Leukocytosis and fever, sepsis syndrome, although this may be related to      the flare of systemic lupus erythematosus.  6.  Elevated BP without h/o HTN   PLAN:  1.  We will admit this lady.  2.  Hydrate her vigorously.  3.  Get a 2D echo.  4.  We will bear in mind that she may be having pericarditis.  5.  As well as hydrating her, we will also give her NSAIDs for the pain and      for a possible pericarditis.  6.  We will also give her intravenous steroids.  7.  We will get her hemoglobin electrophoresis and see what comes out of      that.  8.  We will do blood cultures and cover her with antibiotics.  9.  We will treat her hypertension with metoprolol.      Karlyn Agee, M.D.  Electronically Signed     LC/MEDQ  D:  01/28/2005  T:  01/28/2005  Job:  [id number]

## 2010-08-02 NOTE — H&P (Signed)
[name], [name] NO.:  [id number]   MEDICAL RECORD NO.:  [id number]          PATIENT TYPE:  INP   LOCATION:  A328                          FACILITY:  [facility]   PHYSICIAN:  Thayer Headings, MD    DATE OF BIRTH:  1975/03/29   DATE OF ADMISSION:  04/19/2006  DATE OF DISCHARGE:  LH                              HISTORY & PHYSICAL   HISTORY OF PRESENT ILLNESS:  This is a 35 year old African American  female with a reported history of lupus, recently discharged from  [facility] in [place] on January 29 with a new onset of  seizures.  The patient, at that time, was loaded with Dilantin, seen by  Neurology, had an EEG and MRI, both reported to be normal, and  discharged on po Dilantin as well as plan to follow up with the PCP in  [place] and start her back on Plaquenil.  However, the patient did not  follow up with the physician, and the day of admission moved to the  [place] area with her aunt.  The patient has lived in the [place]  area for about a year.  Previously, she had been in the [place] area  and has actually been admitted for a similar flare about a year ago.  The patient presents today with mental status changes and hurt all  over.  the patient also has apparently a history of mental retardation.  The patient was apparently seen by a rheumatologist a year ago for  following her hospital admission late 2006, but never followed up with  that rheumatologist after one visit.  The patient is a poor historian,  and the aunt has not been involved with her care over the last year or  so.  Of note, the patient was in [place] for about three years prior to  her last admission of a year and a half ago and was followed by a  rheumatologist there.  She does have three children, one of which is in  [place], one with the aunt who is caring for her now and one in  [place].  The patient and her aunt report no current fever, joint aches  or any other  issues.   PAST MEDICAL HISTORY:  1. SLE.  2. New onset seizure disorder.   MEDICATIONS:  1. Dilantin 200 mg q.h.s.  2. Ferrous sulfate 325 mg daily.  3. Gabapentin 100 mg daily.   ALLERGIES:  No known drug allergies.   SOCIAL HISTORY:  Denies any smoking, drinking or drug use.   FAMILY HISTORY:  CAD and hypertension.   REVIEW OF SYSTEMS:  My 12-point review of systems was obtained and was  negative and that is reported in the history of present illness.   PHYSICAL EXAMINATION:  VITAL SIGNS:  Temperature 98.2, blood pressure  106/72, pulse 130, respirations 18.  GENERAL:  The patient is awake, alert and does not answer many  questions, only yes and no.  CARDIOVASCULAR:  Regular rate and rhythm.  No murmurs, rubs or gallops.  LUNGS:  Clear to auscultation bilaterally.  EXTREMITIES:  No cyanosis, clubbing  or edema.  SKIN:  Slight darkening of the cheeks bilaterally consistent with a mild  malar rash, also noted by the aunt.   LABORATORY DATA:  CT of the head with no acute disease, some noted  atrophy.  Chest x-ray:  No acute disease.  Urine drug screen, positive  for barbiturates.  Urinalysis with 15 ketones.  CBC with WBC 4.5,  hemoglobin 11.8, platelets 406.  Sodium 135, potassium 3.8, chloride  102, bicarbonate 25, glucose 87, BUN 3, creatinine 0.61.  Total  bilirubin 0.6, alk-phos 71, AST 92, ALT 102, total protein 6.8 and  albumin 3.8.  Ammonia 15, Dilantin level of 10.2 and a sed rate of 9.   ASSESSMENT/PLAN:  1. Mental status changes.  Etiology certainly of mental status changes      could be the barbiturates that she has taken.  Although, the aunt      of the patient is unaware of any medications containing      barbiturates that she has had.  Additionally, a lupus CNS flare,      although, the sed rate is normal.  Another etiology could be a      hepatic encephalopathy, although pneumonia is negative.  She does      have an elevation of her transaminase of unclear  etiology.  I am      really unclear at this time if this is patient's baseline and if an      atrophy noted on CT scan is consistent with her usual functioning.      Will have patient seen by a rheumatologist while in house and      likely start her on Plaquenil.  I will also have her start Solu-      Medrol, in the meantime, pending further evaluation of her mental      status.  2. Transaminitis. Etiology could be the patient's Dilantin or the      Neurontin that she has been taking.  We will hold off on her p.m.      dose of Dilantin tonight and use Ativan as needed for any seizure      activity and reevaluate her liver function in conjunction with the      neurologist.   DISPOSITION:  Will have Case Management get her a primary care physician  as well as a rheumatologist in the area.  Also, will get records of an  EEG and an MRI that was done at the [facility].  The  patient is a full code.      Thayer Headings, MD  Electronically Signed     RWC/MEDQ  D:  04/19/2006  T:  04/20/2006  Job:  [id number]

## 2010-08-02 NOTE — Group Therapy Note (Signed)
[name], [name]             ACCOUNT NO.:  [id number]   MEDICAL RECORD NO.:  [id number]          PATIENT TYPE:  INP   LOCATION:  A311                          FACILITY:  [facility]   PHYSICIAN:  Audria Nine, M.D.DATE OF BIRTH:  1975-08-15   DATE OF PROCEDURE:  05/03/2006  DATE OF DISCHARGE:                                 PROGRESS NOTE   SUBJECTIVE:  The patient really does not communicate well, but she  states she feels a little bit better.  Continues to feel weak.  She is a  little more alert today.  She, however, has no insight into her medical  condition.  Continues to complain of weakness and she is essentially  curled in the fetal position.   OBJECTIVE:  GENERAL:  The patient is curled in a fetal position.  She is  awake and alert but not really communicative.  VITAL SIGNS:  Blood pressure 129/94.  Pulse 108.  Respirations 20.  Temperature 97.8.  Oxygen saturation was 100% on room air.  HEENT EXAM:  Normocephalic, atraumatic.  Oral mucosa is moist.  Pupils  are equal, reactive to light and accommodation.  NECK:  Supple.  No JVD or lymphadenopathy.  LUNGS:  Clear clinically anteriorly bilaterally.  HEART:  S1, S2.  No S3, S4, gallops, or rubs.  ABDOMEN:  Soft, nontender.  Bowel sounds positive.  CNS EXAM:  Really unremarkable other than her being lethargic.  She had  no focal neurological deficits.   LABORATORY/DIAGNOSTIC DATA:  1. White blood cell count 12.5.  Hemoglobin 12.2.  Hematocrit 6.9.      Platelet count 433.  Neutrophils 93%.  2. Basic metabolic profile was normal except for a mildly elevated      blood glucose of 122, likely secondary to the steroids she      received.  Calcium 9.4.   ASSESSMENT AND PLAN:  This is a 35 year old African-American female with  a history of SLE, admitted to the hospital with altered mental status  and what appears to be a seizure episode.  The patient has  had impaired  cognition with nonsensical speech and confusion.  The  patient appears to  be a little more alert today, although she continues to be lethargic.  The patient received a bolus of steroid as by Dr. Merlene Laughter, neurologist.  The patient also had an EEG which was really unremarkable.  As per the  neurologist, there is a plan to have an LP to evaluate for possibility  of lupus cerebritis.  In the interim, she remains on Neurontin 100 mg  p.o. once a day and Dilantin for seizure control.  She is also now on  Prednisone 40 mg p.o. once a day.  DVT prophylaxis is with Lovenox.  We  are deferring to the neurologist, really, in terms of ongoing care and  recommendations.     Audria Nine, M.D.  Electronically Signed    AM/MEDQ  D:  05/03/2006  T:  05/03/2006  Job:  [id number]

## 2010-08-02 NOTE — Consult Note (Signed)
NAMECATHALENE, [name]             ACCOUNT NO.:  [id number]   MEDICAL RECORD NO.:  [id number]          PATIENT TYPE:  INP   LOCATION:  A311                          FACILITY:  [facility]   PHYSICIAN:  Kofi A. Merlene Laughter, M.D. DATE OF BIRTH:  October 06, 1975   DATE OF CONSULTATION:  DATE OF DISCHARGE:                                 CONSULTATION   This is a 34 year old lady who was recently discharged from the hospital  for altered mental status that was thought to be due to un-witnessed  seizure.  The patient presented to the hospital again for an episode of  chest pain.  The history was actually obtained from the chart, notes,  and in talking to relatives via telephone.  It appears that the patient  was undergoing physical therapy and on walking around she had a drop in  her blood pressure.  EMS was subsequently activated and the patient was  taken to the hospital with a complaint of chest pain.  During the  evaluation downstairs by Dr. Maryland Pink, the patient was noted to have  altered mental status, not knowing why she was in the hospital, and not  being able to give a significant history.  On the last hospitalization,  the patient was given a high dose of prednisone and her Dilantin was  increased from 200 to 300 mg.  The family members were questioned about  her current episode, and no syncope or loss of consciousness was  reported.  No headaches were reported.  No fevers, focal neurological  symptoms, or abdominal symptoms.   PAST MEDICAL HISTORY:  1. Seizure disorder.  2. Systemic lupus erythematosus.  3. Iron-deficiency anemia.   CURRENT MEDICATIONS:  1. Prednisone 10 mg.  2. Protonix 40 mg.  3. Lisinopril.  4. Neurontin.  5. Lovenox.  6. Dilantin 300 mg q.h.s.   SOCIAL HISTORY:  Apparently lives alone, although this is in doubt as I  was able to contact family member at the number on the chart.  No  history of tobacco, alcohol, or illicit drug use.   PHYSICAL EXAMINATION:   GENERAL:  Showed a thin, somewhat chronically ill-  appearing, pleasant female in no acute distress.  VITAL SIGNS:  Temperature 98.6, pulse 108, respirations 22, blood  pressure 117/74.  HEENT:  Neck is supple.  Head is normocephalic atraumatic.  ABDOMEN:  Soft.  EXTREMITIES:  Mild edema.  MENTATION:  The patient is awake and alert.  She is cooperative with the  evaluation.  She follows commands well.  No dysarthria is noted.  No  aphasia is noted.  She is disoriented, however, stating that it is June  2007.  She, however, knows that she is in the [facility].  She  has poor insight into her medical problems stating that she is in the  hospital to be with her family member and was unable to state why she  was admitted to the hospital.  CRANIAL NERVES:  Pupils are equal, round, and reactive to light and  accommodation.  Extraocular movements are full.  Visual fields are  intact.  Facial muscle strength is symmetric.  Tongue is midline.  Uvula  is midline.  Shoulder shrugs are normal.  MOTOR:  Shows normal tone, bulk, and strength.  There is no pronator  drift.  Coordination is intact.  Reflexes are slow and slightly  diminished in the legs but normal in the upper extremities.  Plantar  reflexes are downgoing. Coordination is normal.  Finger-to-nose  bilaterally there is no dysmetria, tremors, rigidity, or bradykinesia.  Face shows normal to temperature and light touch.   An MRI done about a week ago shows a diffuse white moderate hyper-  intensity on diffusion imaging, flare imaging, and all those sequences  are essentially unremarkable.  There is some mild hyper-intensity noted  on diffusion in the periventricular regions.   Dilantin level 24.7, sed rate 5, ammonia level is normal, calcium 8,  liver enzymes are normal, and chemistry is essentially unrevealing.   ASSESSMENT:  1. Mild to moderate encephalopathy, unfortunately this may be the      patient's baseline as a  sequelae of her lupus.  2. Seizure disorder.  3. Mild toxicity from Dilantin with a sustained end gaze nystagmus      noted on physical examination.   RECOMMENDATIONS:  1. EEG which actually has been ordered.  This will be followed.  2. Trial of a bolus of pulse steroids.  I recommend Solu-Medrol 500 mg      for 3 days and then taper afterwards.  3. Reduce the Dilantin level, probably alternating dose of 300 and 200      every other day.   Thanks for this consultation.      Kofi A. Merlene Laughter, M.D.  Electronically Signed     KAD/MEDQ  D:  04/30/2006  T:  04/30/2006  Job:  [id number]

## 2010-08-02 NOTE — Group Therapy Note (Signed)
[name], MOHRBACHER             ACCOUNT NO.:  [id number]   MEDICAL RECORD NO.:  [id number]          PATIENT TYPE:  INP   LOCATION:  A201                          FACILITY:  [facility]   PHYSICIAN:  Audria Nine, M.D.DATE OF BIRTH:  [birth year]   DATE OF PROCEDURE:  05/16/2006  DATE OF DISCHARGE:                                 PROGRESS NOTE   PROGRESS NOTE  May 16, 2006   SUBJECTIVE:  The patient feels a little better today but continues to be  weak.  The patient is curled up in the fetal position. Does not really  respond to questioning. She has had no seizure since being in the  hospital. The patient does have a history of seizures with  subtherapeutic dilantin and that has been loaded and is being followed  by the pharmacist.  She was recently hospitalized for subacute  encephalopathy.  The patient admitted status post an orthostatic  hypotension and tachycardia felt likely secondary to fluids.  She  remains on IV normal saline at 200 cc/Hr.  Her blood pressure is  slightly improved but is still low.   OBJECTIVE:  GENERAL APPEARANCE: She is a lethargic, chronically ill  looking, weak patient.  VITAL SIGNS: Blood pressure was 93/65, pulse of 118, respiration of 18,  temperature 97.9, oxygen saturation was 100% on room air.  HEENT EXAM: Normocephalic, atraumatic. Oral mucosa was moist with no  exudates.  NECK: Supple, no JVD. No lymphadenopathy.  LUNGS: Clear clinically with good air entry bilaterally.  HEART: S1, S2 regular, no S3, gallops or rubs.  ABDOMEN: Soft, nontender, bowel sounds positive. No masses palpable.  EXTREMITIES: No edema.   LABORATORY/DIAGNOSTIC DATA:  Cardiac enzymes were negative. Pregnancy  test is negative. A phenytoin level checked yesterday was 2.9. Urine  toxicology screen was negative.   ASSESSMENT AND PLAN:  This is a 35 year old African-American female with  SLE, felt to have subacute encephalopathy from a recent hospitalization.  Patient was  admitted with what appeared to be orthostatic hypotension  and subtherapeutic dilantin levels. The patient was treated with  dilantin and had no trouble tolerating it.   Her blood pressure is slightly improved but still in a hypotensive  range. Will continue her fluid therapy at this level at a current rate  of 200 cc/Hr.  Will continue to monitor her closely and institute fall precautions.   Would discontinue her Lisinopril entirely. Will check a CBC, BMET in the  a.m.      Audria Nine, M.D.  Electronically Signed     AM/MEDQ  D:  05/16/2006  T:  05/16/2006  Job:  [id number]

## 2010-08-02 NOTE — Procedures (Signed)
NAMETWANNA, CROWEL             ACCOUNT NO.:  [id number]   MEDICAL RECORD NO.:  [id number]          PATIENT TYPE:  INP   LOCATION:  A340                          FACILITY:  [facility]   PHYSICIAN:  Cristopher Estimable. Lattie Haw, MD, FACCDATE OF BIRTH:  [birth year]   DATE OF PROCEDURE:  05/18/2006  DATE OF DISCHARGE:                                ECHOCARDIOGRAM   REFERRING PHYSICIAN:  Cristopher Estimable. Lattie Haw, MD.   CLINICAL DATA:  A 35 year old woman with hypotension and seizures.   M-mode:  Aorta 2.5, left atrium 2.7, septum 1.4, posterior wall 1.0, LV  diastole 2.7, LV systole 1.8.   1. Technically adequate echocardiographic study.  2. Normal left atrium, right atrium and right ventricle.  3. Normal aortic, mitral and tricuspid valves; normal Doppler study      with physiologic tricuspid regurgitation.  4. Normal proximal pulmonary artery; pulmonic valve not well seen, but      is grossly normal.  5. Normal left ventricular size; borderline hypertrophy of the septum;      normal regional and global LV systolic function.  6. Normal IVC.  7. Comparison with prior study of January 30, 2005:  No significant      interval change.      Cristopher Estimable. Lattie Haw, MD, [facility]  Electronically Signed     RMR/MEDQ  D:  05/18/2006  T:  05/19/2006  Job:  [id number]

## 2010-08-02 NOTE — Procedures (Signed)
NAMEZILA, LEAS             ACCOUNT NO.:  [id number]   MEDICAL RECORD NO.:  [id number]          PATIENT TYPE:  INP   LOCATION:  A311                          FACILITY:  [facility]   PHYSICIAN:  Kofi A. Merlene Laughter, M.D. DATE OF BIRTH:  13-May-1975   DATE OF PROCEDURE:  05/01/2006  DATE OF DISCHARGE:                              EEG INTERPRETATION   This is a 35 year old lady who has history of seizures and recurrent  altered mental status.   MEDICATION:  Zestril, Dilantin, Lovenox, Neurontin, Protonix, Vicodin.   This is a 16-channel recording conducted for 27 minutes.  There is a  well formed posterior beta activity of 14 hertz bilaterally which  attenuates with eye opening.  There is low electrical voltage noted.  Weight gain, sleep activity are both observed with K complexes and sleep  spindles noted, indicating stage 2 sleep.  There is no focal slowing,  lateralized slowing or epileptiform activity noted.   IMPRESSION:  This is a normal recording of the awake and sleep state.      Kofi A. Merlene Laughter, M.D.  Electronically Signed     KAD/MEDQ  D:  05/01/2006  T:  05/01/2006  Job:  [id number]

## 2010-12-05 LAB — RENAL FUNCTION PANEL
Albumin: 1 — ABNORMAL LOW
Albumin: 1 — ABNORMAL LOW
Albumin: 1 — ABNORMAL LOW
Albumin: 1.2 — ABNORMAL LOW
Albumin: 1.2 — ABNORMAL LOW
Albumin: 1.4 — ABNORMAL LOW
Albumin: 1.4 — ABNORMAL LOW
BUN: 25 — ABNORMAL HIGH
BUN: 25 — ABNORMAL HIGH
BUN: 25 — ABNORMAL HIGH
CO2: 16 — ABNORMAL LOW
CO2: 20
CO2: 24
CO2: 28
Calcium: 6.7 — ABNORMAL LOW
Calcium: 6.9 — ABNORMAL LOW
Calcium: 6.9 — ABNORMAL LOW
Calcium: 7.3 — ABNORMAL LOW
Chloride: 105
Chloride: 111
Chloride: 117 — ABNORMAL HIGH
Creatinine, Ser: 3.65 — ABNORMAL HIGH
Creatinine, Ser: 4.29 — ABNORMAL HIGH
Creatinine, Ser: 4.93 — ABNORMAL HIGH
GFR calc Af Amer: 12 — ABNORMAL LOW
GFR calc Af Amer: 12 — ABNORMAL LOW
GFR calc Af Amer: 14 — ABNORMAL LOW
GFR calc Af Amer: 14 — ABNORMAL LOW
GFR calc Af Amer: 17 — ABNORMAL LOW
GFR calc Af Amer: 27 — ABNORMAL LOW
GFR calc Af Amer: >60
GFR calc non Af Amer: 10 — ABNORMAL LOW
GFR calc non Af Amer: 10 — ABNORMAL LOW
GFR calc non Af Amer: 12 — ABNORMAL LOW
GFR calc non Af Amer: 12 — ABNORMAL LOW
GFR calc non Af Amer: 14 — ABNORMAL LOW
GFR calc non Af Amer: 23 — ABNORMAL LOW
GFR calc non Af Amer: >60
Glucose, Bld: 106 — ABNORMAL HIGH
Glucose, Bld: 98
Phosphorus: 2.6
Phosphorus: 3.6
Phosphorus: 3.6
Phosphorus: 3.7
Phosphorus: 3.9
Phosphorus: 4.2
Potassium: 2.9 — ABNORMAL LOW
Potassium: 3.3 — ABNORMAL LOW
Potassium: 3.8
Potassium: 3.9
Potassium: 4.4
Sodium: 132 — ABNORMAL LOW
Sodium: 135
Sodium: 136

## 2010-12-05 LAB — DIFFERENTIAL
Band Neutrophils: 27 — ABNORMAL HIGH
Band Neutrophils: 15 — ABNORMAL HIGH
Basophils Absolute: 0
Basophils Absolute: 0
Basophils Relative: 0
Basophils Relative: 0
Blasts: 0
Eosinophils Absolute: 0
Eosinophils Relative: 1
Lymphocytes Relative: 6 — ABNORMAL LOW
Lymphs Abs: 0.8
Metamyelocytes Relative: 1
Monocytes Absolute: 0.6
Monocytes Absolute: 0.9
Monocytes Relative: 2 — ABNORMAL LOW
Myelocytes: 0
Myelocytes: 0
Neutro Abs: 12.7 — ABNORMAL HIGH
Neutrophils Relative %: 93 — ABNORMAL HIGH
Promyelocytes Absolute: 0
Promyelocytes Absolute: 0
WBC Morphology: INCREASED
nRBC: 0

## 2010-12-05 LAB — TSH: TSH: 1.379

## 2010-12-05 LAB — URINALYSIS, ROUTINE W REFLEX MICROSCOPIC
Bilirubin Urine: NEGATIVE
Glucose, UA: NEGATIVE
Glucose, UA: NEGATIVE
Leukocytes, UA: NEGATIVE
Nitrite: NEGATIVE
Protein, ur: >300 — AB
Protein, ur: >300 — AB
Urobilinogen, UA: 0.2

## 2010-12-05 LAB — SODIUM, URINE, RANDOM: Sodium, Ur: 69

## 2010-12-05 LAB — BASIC METABOLIC PANEL
BUN: 21
CO2: 19
CO2: 17 — ABNORMAL LOW
Chloride: 111
Chloride: 112
Chloride: 123 — ABNORMAL HIGH
Creatinine, Ser: 1.59 — ABNORMAL HIGH
Creatinine, Ser: 1.76 — ABNORMAL HIGH
GFR calc Af Amer: 40 — ABNORMAL LOW
GFR calc non Af Amer: 23 — ABNORMAL LOW
GFR calc non Af Amer: 27 — ABNORMAL LOW
Glucose, Bld: 97
Glucose, Bld: 108 — ABNORMAL HIGH
Glucose, Bld: 97
Potassium: 3.6
Potassium: 3.3 — ABNORMAL LOW
Potassium: 3.9
Sodium: 141
Sodium: 145

## 2010-12-05 LAB — CBC
HCT: 37.9
HCT: 29.7 — ABNORMAL LOW
HCT: 29.9 — ABNORMAL LOW
HCT: 30.8 — ABNORMAL LOW
HCT: 31 — ABNORMAL LOW
HCT: 31.4 — ABNORMAL LOW
HCT: 32.8 — ABNORMAL LOW
Hemoglobin: 10 — ABNORMAL LOW
Hemoglobin: 10.3 — ABNORMAL LOW
Hemoglobin: 10.4 — ABNORMAL LOW
Hemoglobin: 10.7 — ABNORMAL LOW
Hemoglobin: 11.3 — ABNORMAL LOW
MCHC: 33.1
MCHC: 33.2
MCHC: 33.7
MCV: 82.9
MCV: 80.5
MCV: 82.2
MCV: 82.5
MCV: 84.3
Platelets: 278
Platelets: 211
Platelets: 226
Platelets: 261
Platelets: 270
Platelets: 390
Platelets: 408 — ABNORMAL HIGH
Platelets: 444 — ABNORMAL HIGH
RBC: 3.71 — ABNORMAL LOW
RBC: 3.76 — ABNORMAL LOW
RBC: 3.81 — ABNORMAL LOW
RBC: 4.04
RDW: 15.5
RDW: 14.4
RDW: 14.9
RDW: 15.3
RDW: 15.4
RDW: 15.5
RDW: 16.2 — ABNORMAL HIGH
WBC: 11.3 — ABNORMAL HIGH
WBC: 14.2 — ABNORMAL HIGH
WBC: 14.2 — ABNORMAL HIGH
WBC: 17.8 — ABNORMAL HIGH
WBC: 20.7 — ABNORMAL HIGH
WBC: 28.8 — ABNORMAL HIGH

## 2010-12-05 LAB — COMPREHENSIVE METABOLIC PANEL
ALT: 10
ALT: 10
ALT: 11
AST: 28
AST: 29
Albumin: 1.1 — ABNORMAL LOW
Albumin: 1.4 — ABNORMAL LOW
Albumin: <1 — ABNORMAL LOW
Alkaline Phosphatase: 108
Alkaline Phosphatase: 69
Alkaline Phosphatase: 81
BUN: 20
BUN: 28 — ABNORMAL HIGH
CO2: 16 — ABNORMAL LOW
CO2: 31
Calcium: 5.6 — CL
Chloride: 103
Chloride: 103
Chloride: 120 — ABNORMAL HIGH
Creatinine, Ser: 3.17 — ABNORMAL HIGH
Creatinine, Ser: 3.54 — ABNORMAL HIGH
GFR calc Af Amer: 13 — ABNORMAL LOW
GFR calc Af Amer: 21 — ABNORMAL LOW
GFR calc non Af Amer: 17 — ABNORMAL LOW
GFR calc non Af Amer: 26 — ABNORMAL LOW
Glucose, Bld: 136 — ABNORMAL HIGH
Potassium: 3.1 — ABNORMAL LOW
Potassium: 3.2 — ABNORMAL LOW
Potassium: 3.4 — ABNORMAL LOW
Potassium: 3.6
Sodium: 136
Sodium: 136
Sodium: 145
Total Bilirubin: 0.5
Total Bilirubin: 0.7
Total Bilirubin: 0.7
Total Protein: 4.3 — ABNORMAL LOW
Total Protein: 4.8 — ABNORMAL LOW
Total Protein: 5.4 — ABNORMAL LOW
Total Protein: 5.9 — ABNORMAL LOW

## 2010-12-05 LAB — URINE MICROSCOPIC-ADD ON

## 2010-12-05 LAB — CLOSTRIDIUM DIFFICILE EIA
C difficile Toxins A+B, EIA: NEGATIVE
C difficile Toxins A+B, EIA: NEGATIVE
C difficile Toxins A+B, EIA: NEGATIVE

## 2010-12-05 LAB — CREATININE, URINE, RANDOM: Creatinine, Urine: 81.8

## 2010-12-05 LAB — PHOSPHORUS: Phosphorus: 1.5 — ABNORMAL LOW

## 2010-12-05 LAB — PTH, INTACT AND CALCIUM: PTH: 196.8 — ABNORMAL HIGH

## 2010-12-05 LAB — CULTURE, BLOOD (ROUTINE X 2)
Culture: NO GROWTH
Culture: NO GROWTH

## 2010-12-05 LAB — VANCOMYCIN, TROUGH
Vancomycin Tr: 13
Vancomycin Tr: 16.7

## 2010-12-05 LAB — URINALYSIS, MICROSCOPIC ONLY
Ketones, ur: NEGATIVE
Nitrite: NEGATIVE
Protein, ur: >300 — AB

## 2010-12-05 LAB — ANTI-NUCLEAR AB-TITER (ANA TITER): ANA Titer 1: 1:80 {titer} — ABNORMAL HIGH

## 2010-12-05 LAB — FECAL LACTOFERRIN, QUANT: Fecal Lactoferrin: NEGATIVE

## 2010-12-05 LAB — PROTEIN / CREATININE RATIO, URINE
Creatinine, Urine: 69
Protein Creatinine Ratio: 3.22 — ABNORMAL HIGH (ref <0.15)
Total Protein, Urine: 222

## 2010-12-05 LAB — IRON AND TIBC
Iron: 71
UIBC: 69

## 2010-12-05 LAB — CALCIUM, IONIZED: Calcium, Ion: 0.86 — ABNORMAL LOW

## 2010-12-05 LAB — STOOL CULTURE

## 2010-12-05 LAB — GIARDIA/CRYPTOSPORIDIUM SCREEN(EIA): Cryptosporidium Screen (EIA): NEGATIVE

## 2010-12-05 LAB — C4 COMPLEMENT: Complement C4, Body Fluid: 5 — ABNORMAL LOW

## 2010-12-06 LAB — HEMOGLOBIN AND HEMATOCRIT, BLOOD
HCT: 27.3 — ABNORMAL LOW
HCT: 28 — ABNORMAL LOW
HCT: 31 — ABNORMAL LOW
HCT: 34.7 — ABNORMAL LOW
Hemoglobin: 10.4 — ABNORMAL LOW
Hemoglobin: 11.6 — ABNORMAL LOW
Hemoglobin: 9 — ABNORMAL LOW
Hemoglobin: 9.3 — ABNORMAL LOW
Hemoglobin: 9.5 — ABNORMAL LOW

## 2010-12-06 LAB — CROSSMATCH
ABO/RH(D): O POS
Antibody Screen: NEGATIVE

## 2010-12-06 LAB — CBC
HCT: 26 — ABNORMAL LOW
HCT: 27.4 — ABNORMAL LOW
HCT: 29.7 — ABNORMAL LOW
Hemoglobin: 10 — ABNORMAL LOW
Hemoglobin: 10.2 — ABNORMAL LOW
Hemoglobin: 8.7 — ABNORMAL LOW
Hemoglobin: 8.8 — ABNORMAL LOW
Hemoglobin: 9.3 — ABNORMAL LOW
MCHC: 33.2
MCHC: 33.4
MCHC: 33.6
MCV: 82.1
MCV: 82.4
MCV: 83.8
MCV: 83.8
Platelets: 264
Platelets: 279
Platelets: 308
RBC: 3.17 — ABNORMAL LOW
RBC: 3.2 — ABNORMAL LOW
RBC: 3.29 — ABNORMAL LOW
RBC: 3.35 — ABNORMAL LOW
RBC: 3.54 — ABNORMAL LOW
RBC: 3.65 — ABNORMAL LOW
RDW: 15.8 — ABNORMAL HIGH
RDW: 16.2 — ABNORMAL HIGH
RDW: 16.9 — ABNORMAL HIGH
RDW: 17.3 — ABNORMAL HIGH
WBC: 16.8 — ABNORMAL HIGH
WBC: 19.3 — ABNORMAL HIGH
WBC: 19.7 — ABNORMAL HIGH
WBC: 19.7 — ABNORMAL HIGH
WBC: 22.1 — ABNORMAL HIGH

## 2010-12-06 LAB — CLOSTRIDIUM DIFFICILE EIA: C difficile Toxins A+B, EIA: NEGATIVE

## 2010-12-06 LAB — RENAL FUNCTION PANEL
Albumin: 1.3 — ABNORMAL LOW
Albumin: 1.3 — ABNORMAL LOW
BUN: 26 — ABNORMAL HIGH
BUN: 27 — ABNORMAL HIGH
BUN: 30 — ABNORMAL HIGH
CO2: 23
CO2: 24
Calcium: 7.4 — ABNORMAL LOW
Chloride: 107
Chloride: 108
Chloride: 111
Creatinine, Ser: 2.64 — ABNORMAL HIGH
Creatinine, Ser: 3.46 — ABNORMAL HIGH
GFR calc Af Amer: 25 — ABNORMAL LOW
GFR calc Af Amer: 29 — ABNORMAL LOW
GFR calc non Af Amer: 21 — ABNORMAL LOW
GFR calc non Af Amer: 24 — ABNORMAL LOW
Glucose, Bld: 115 — ABNORMAL HIGH
Glucose, Bld: 139 — ABNORMAL HIGH
Phosphorus: 1.8 — ABNORMAL LOW
Phosphorus: 2.9
Potassium: 3.1 — ABNORMAL LOW
Potassium: 3.3 — ABNORMAL LOW
Potassium: 3.4 — ABNORMAL LOW
Sodium: 138
Sodium: 138

## 2010-12-06 LAB — COMPREHENSIVE METABOLIC PANEL
ALT: 11
ALT: 12
AST: 18
Alkaline Phosphatase: 109
Alkaline Phosphatase: 97
BUN: 26 — ABNORMAL HIGH
CO2: 20
CO2: 22
Calcium: 7.4 — ABNORMAL LOW
Calcium: 7.7 — ABNORMAL LOW
Chloride: 103
Creatinine, Ser: 2.65 — ABNORMAL HIGH
GFR calc Af Amer: 29 — ABNORMAL LOW
GFR calc non Af Amer: 20 — ABNORMAL LOW
GFR calc non Af Amer: 21 — ABNORMAL LOW
Glucose, Bld: 109 — ABNORMAL HIGH
Glucose, Bld: 279 — ABNORMAL HIGH
Potassium: 3.6
Potassium: 3.6
Sodium: 130 — ABNORMAL LOW
Sodium: 137
Total Bilirubin: 0.4
Total Protein: 5.3 — ABNORMAL LOW

## 2010-12-06 LAB — IRON AND TIBC
Iron: 44
Saturation Ratios: 31
TIBC: 141 — ABNORMAL LOW
UIBC: 97

## 2010-12-06 LAB — COMPREHENSIVE METABOLIC PANEL WITH GFR
ALT: 13
AST: 21
Albumin: 1.3 — ABNORMAL LOW
Alkaline Phosphatase: 101
BUN: 30 — ABNORMAL HIGH
CO2: 22
Calcium: 7.3 — ABNORMAL LOW
Chloride: 109
Creatinine, Ser: 3.12 — ABNORMAL HIGH
GFR calc Af Amer: 21 — ABNORMAL LOW
GFR calc non Af Amer: 17 — ABNORMAL LOW
Glucose, Bld: 137 — ABNORMAL HIGH
Potassium: 3.6
Sodium: 138
Total Bilirubin: 0.5
Total Protein: 4.8 — ABNORMAL LOW

## 2010-12-06 LAB — FERRITIN: Ferritin: 345 — ABNORMAL HIGH (ref 10–291)

## 2010-12-06 LAB — ABO/RH: ABO/RH(D): O POS

## 2010-12-06 LAB — FECAL FAT, QUALITATIVE

## 2010-12-06 LAB — VITAMIN B12: Vitamin B-12: 447 (ref 211–911)

## 2010-12-06 LAB — PHOSPHORUS: Phosphorus: 2.8

## 2010-12-06 LAB — OCCULT BLOOD X 1 CARD TO LAB, STOOL
Fecal Occult Bld: NEGATIVE
Fecal Occult Bld: POSITIVE
Fecal Occult Bld: POSITIVE

## 2010-12-06 LAB — MAGNESIUM
Magnesium: 1.5
Magnesium: 1.6

## 2010-12-12 LAB — URINE MICROSCOPIC-ADD ON

## 2010-12-12 LAB — CBC
MCV: 78.5
Platelets: 486 — ABNORMAL HIGH
WBC: 11.2 — ABNORMAL HIGH

## 2010-12-12 LAB — URINALYSIS, ROUTINE W REFLEX MICROSCOPIC
Glucose, UA: NEGATIVE
Leukocytes, UA: NEGATIVE
Protein, ur: >300 — AB
Specific Gravity, Urine: 1.015
pH: 8

## 2010-12-12 LAB — DIFFERENTIAL
Basophils Absolute: 0
Eosinophils Absolute: 0.1
Lymphocytes Relative: 17
Lymphs Abs: 1.9
Neutro Abs: 8.8 — ABNORMAL HIGH

## 2010-12-12 LAB — BASIC METABOLIC PANEL
BUN: 29 — ABNORMAL HIGH
Calcium: 11.4 — ABNORMAL HIGH
Chloride: 105
Creatinine, Ser: 2.12 — ABNORMAL HIGH

## 2010-12-13 LAB — BASIC METABOLIC PANEL
CO2: 22
Calcium: 7.5 — ABNORMAL LOW
Chloride: 110
Creatinine, Ser: 2.05 — ABNORMAL HIGH
GFR calc Af Amer: 32 — ABNORMAL LOW
GFR calc Af Amer: 34 — ABNORMAL LOW
GFR calc non Af Amer: 26 — ABNORMAL LOW
Glucose, Bld: 154 — ABNORMAL HIGH
Potassium: 5.9 — ABNORMAL HIGH
Sodium: 137

## 2010-12-13 LAB — CBC
HCT: 25.2 — ABNORMAL LOW
Hemoglobin: 8.4 — ABNORMAL LOW
MCHC: 33.1
MCV: 87.3
Platelets: 205
RBC: 2.89 — ABNORMAL LOW
RDW: 16.9 — ABNORMAL HIGH
WBC: 17.6 — ABNORMAL HIGH

## 2010-12-13 LAB — DIFFERENTIAL
Basophils Absolute: 0
Basophils Relative: 0
Eosinophils Absolute: 0.2
Eosinophils Relative: 0
Eosinophils Relative: 1
Lymphocytes Relative: 4 — ABNORMAL LOW
Lymphocytes Relative: 5 — ABNORMAL LOW
Lymphs Abs: 0.7
Lymphs Abs: 1
Monocytes Absolute: 0.6
Monocytes Relative: 2 — ABNORMAL LOW
Monocytes Relative: 3
Neutro Abs: 18.9 — ABNORMAL HIGH
Neutrophils Relative %: 91 — ABNORMAL HIGH
WBC Morphology: INCREASED

## 2010-12-13 LAB — IRON AND TIBC: Iron: 31 — ABNORMAL LOW

## 2010-12-13 LAB — FERRITIN: Ferritin: 964 — ABNORMAL HIGH (ref 10–291)

## 2010-12-13 LAB — CROSSMATCH: Antibody Screen: NEGATIVE

## 2010-12-13 LAB — PROTIME-INR
INR: 1
Prothrombin Time: 13.1

## 2010-12-13 LAB — VITAMIN B12: Vitamin B-12: 203 — ABNORMAL LOW (ref 211–911)

## 2010-12-13 LAB — ABO/RH: ABO/RH(D): O POS

## 2010-12-16 LAB — BASIC METABOLIC PANEL
BUN: 106 — ABNORMAL HIGH
BUN: 15
BUN: 20
BUN: 25 — ABNORMAL HIGH
BUN: 29 — ABNORMAL HIGH
BUN: 45 — ABNORMAL HIGH
BUN: 70 — ABNORMAL HIGH
BUN: 97 — ABNORMAL HIGH
CO2: 14 — ABNORMAL LOW
CO2: 15 — ABNORMAL LOW
CO2: 15 — ABNORMAL LOW
CO2: 17 — ABNORMAL LOW
CO2: 22
CO2: 23
CO2: 23
CO2: 24
CO2: 24
CO2: 26
CO2: 28
CO2: 28
Calcium: 7.8 — ABNORMAL LOW
Calcium: 7.9 — ABNORMAL LOW
Calcium: 8 — ABNORMAL LOW
Calcium: 8.1 — ABNORMAL LOW
Calcium: 8.1 — ABNORMAL LOW
Calcium: 8.2 — ABNORMAL LOW
Calcium: 8.2 — ABNORMAL LOW
Calcium: 8.3 — ABNORMAL LOW
Calcium: 8.4
Calcium: 8.7
Calcium: 8.8
Calcium: 8.8
Chloride: 103
Chloride: 104
Chloride: 104
Chloride: 104
Chloride: 104
Chloride: 104
Chloride: 107
Chloride: 108
Chloride: 111
Chloride: 111
Chloride: 114 — ABNORMAL HIGH
Chloride: 114 — ABNORMAL HIGH
Chloride: 115 — ABNORMAL HIGH
Chloride: 115 — ABNORMAL HIGH
Creatinine, Ser: 1.82 — ABNORMAL HIGH
Creatinine, Ser: 1.82 — ABNORMAL HIGH
Creatinine, Ser: 1.86 — ABNORMAL HIGH
Creatinine, Ser: 2.12 — ABNORMAL HIGH
Creatinine, Ser: 2.16 — ABNORMAL HIGH
Creatinine, Ser: 2.43 — ABNORMAL HIGH
Creatinine, Ser: 2.63 — ABNORMAL HIGH
Creatinine, Ser: 2.88 — ABNORMAL HIGH
Creatinine, Ser: 3.61 — ABNORMAL HIGH
Creatinine, Ser: 4.1 — ABNORMAL HIGH
Creatinine, Ser: 4.28 — ABNORMAL HIGH
Creatinine, Ser: 4.4 — ABNORMAL HIGH
Creatinine, Ser: 4.5 — ABNORMAL HIGH
GFR calc Af Amer: 14 — ABNORMAL LOW
GFR calc Af Amer: 15 — ABNORMAL LOW
GFR calc Af Amer: 15 — ABNORMAL LOW
GFR calc Af Amer: 21 — ABNORMAL LOW
GFR calc Af Amer: 23 — ABNORMAL LOW
GFR calc Af Amer: 24 — ABNORMAL LOW
GFR calc Af Amer: 25 — ABNORMAL LOW
GFR calc Af Amer: 28 — ABNORMAL LOW
GFR calc Af Amer: 28 — ABNORMAL LOW
GFR calc Af Amer: 32 — ABNORMAL LOW
GFR calc Af Amer: 33 — ABNORMAL LOW
GFR calc Af Amer: 33 — ABNORMAL LOW
GFR calc Af Amer: 38 — ABNORMAL LOW
GFR calc Af Amer: 39 — ABNORMAL LOW
GFR calc Af Amer: >60
GFR calc non Af Amer: 12 — ABNORMAL LOW
GFR calc non Af Amer: 15 — ABNORMAL LOW
GFR calc non Af Amer: 17 — ABNORMAL LOW
GFR calc non Af Amer: 20 — ABNORMAL LOW
GFR calc non Af Amer: 23 — ABNORMAL LOW
GFR calc non Af Amer: 23 — ABNORMAL LOW
GFR calc non Af Amer: 27 — ABNORMAL LOW
GFR calc non Af Amer: 31 — ABNORMAL LOW
GFR calc non Af Amer: 32 — ABNORMAL LOW
GFR calc non Af Amer: >60
Glucose, Bld: 112 — ABNORMAL HIGH
Glucose, Bld: 114 — ABNORMAL HIGH
Glucose, Bld: 117 — ABNORMAL HIGH
Glucose, Bld: 122 — ABNORMAL HIGH
Glucose, Bld: 213 — ABNORMAL HIGH
Glucose, Bld: 72
Glucose, Bld: 85
Glucose, Bld: 88
Glucose, Bld: 99
Glucose, Bld: 99
Potassium: 2.2 — CL
Potassium: 3.5
Potassium: 3.6
Potassium: 3.6
Potassium: 4.7
Potassium: 5.1
Potassium: 5.2 — ABNORMAL HIGH
Sodium: 136
Sodium: 137
Sodium: 138
Sodium: 139
Sodium: 139
Sodium: 139
Sodium: 140
Sodium: 140
Sodium: 142
Sodium: 142
Sodium: 143

## 2010-12-16 LAB — BLOOD GAS, ARTERIAL
Acid-Base Excess: 0.9
Acid-Base Excess: 6.3 — ABNORMAL HIGH
Acid-base deficit: 0.2
Acid-base deficit: 10 — ABNORMAL HIGH
Acid-base deficit: 10.6 — ABNORMAL HIGH
Acid-base deficit: 12.2 — ABNORMAL HIGH
Acid-base deficit: 9.9 — ABNORMAL HIGH
Bicarbonate: 14.2 — ABNORMAL LOW
Bicarbonate: 21.9
Bicarbonate: 23
Bicarbonate: 24.4 — ABNORMAL HIGH
Bicarbonate: 24.4 — ABNORMAL HIGH
Bicarbonate: 25.2 — ABNORMAL HIGH
FIO2: 0.4
FIO2: 100
FIO2: 40
FIO2: 40
FIO2: 50
MECHVT: 500
MECHVT: 500
MECHVT: 500
MECHVT: 500
MECHVT: 550
Mode: POSITIVE
Mode: POSITIVE
O2 Content: 100
O2 Content: 5
O2 Saturation: 81.4
O2 Saturation: 92.5
O2 Saturation: 93.5
O2 Saturation: 96.1
O2 Saturation: 97.7
O2 Saturation: 98.3
O2 Saturation: 98.6
O2 Saturation: 99.3
PEEP: 5
PEEP: 5
PEEP: 5
PEEP: 5
PEEP: 5
PEEP: 5
PEEP: 5
PEEP: 5
Patient temperature: 37
Patient temperature: 37
Patient temperature: 37
Patient temperature: 37
Patient temperature: 37
Pressure support: 5
RATE: 12
RATE: 12
RATE: 14
RATE: 14
RATE: 14
TCO2: 13
TCO2: 14.1
TCO2: 20.2
TCO2: 21.8
TCO2: 23.3
TCO2: 24.8
pCO2 arterial: 22.8 — ABNORMAL LOW
pCO2 arterial: 26.5 — ABNORMAL LOW
pCO2 arterial: 30.6 — ABNORMAL LOW
pCO2 arterial: 33.4 — ABNORMAL LOW
pCO2 arterial: 35.3
pCO2 arterial: 35.6
pCO2 arterial: 36.2
pCO2 arterial: 36.2
pH, Arterial: 7.357
pH, Arterial: 7.405 — ABNORMAL HIGH
pH, Arterial: 7.42 — ABNORMAL HIGH
pH, Arterial: 7.474 — ABNORMAL HIGH
pH, Arterial: 7.496 — ABNORMAL HIGH
pH, Arterial: 7.526 — ABNORMAL HIGH
pO2, Arterial: 112 — ABNORMAL HIGH
pO2, Arterial: 134 — ABNORMAL HIGH
pO2, Arterial: 218 — ABNORMAL HIGH
pO2, Arterial: 247 — ABNORMAL HIGH
pO2, Arterial: 43 — ABNORMAL LOW
pO2, Arterial: 69.4 — ABNORMAL LOW

## 2010-12-16 LAB — URINE CULTURE
Colony Count: >=100000
Colony Count: >=100000
Special Requests: NEGATIVE

## 2010-12-16 LAB — DIFFERENTIAL
Band Neutrophils: 13 — ABNORMAL HIGH
Basophils Absolute: 0
Basophils Absolute: 0
Basophils Absolute: 0
Basophils Absolute: 0
Basophils Absolute: 0
Basophils Absolute: 0
Basophils Absolute: 0
Basophils Absolute: 0.1
Basophils Absolute: 0.1
Basophils Absolute: 0.1
Basophils Absolute: 0.1
Basophils Absolute: 0.1
Basophils Relative: 0
Basophils Relative: 0
Basophils Relative: 0
Basophils Relative: 0
Basophils Relative: 0
Basophils Relative: 0
Basophils Relative: 0
Basophils Relative: 0
Basophils Relative: 1
Basophils Relative: 1
Basophils Relative: 1
Basophils Relative: 1
Blasts: 0
Eosinophils Absolute: 0
Eosinophils Absolute: 0.2
Eosinophils Absolute: 0.2
Eosinophils Absolute: 0.3
Eosinophils Absolute: 0.3
Eosinophils Absolute: 0.4
Eosinophils Absolute: 0.4
Eosinophils Absolute: 0.5
Eosinophils Absolute: 0.7
Eosinophils Absolute: 0.8 — ABNORMAL HIGH
Eosinophils Absolute: 0.9 — ABNORMAL HIGH
Eosinophils Absolute: 1 — ABNORMAL HIGH
Eosinophils Absolute: 1 — ABNORMAL HIGH
Eosinophils Relative: 0
Eosinophils Relative: 0
Eosinophils Relative: 1
Eosinophils Relative: 2
Eosinophils Relative: 2
Eosinophils Relative: 2
Eosinophils Relative: 2
Eosinophils Relative: 2
Eosinophils Relative: 3
Eosinophils Relative: 4
Eosinophils Relative: 5
Eosinophils Relative: 6 — ABNORMAL HIGH
Eosinophils Relative: 7 — ABNORMAL HIGH
Lymphocytes Relative: 10 — ABNORMAL LOW
Lymphocytes Relative: 11 — ABNORMAL LOW
Lymphocytes Relative: 15
Lymphocytes Relative: 5 — ABNORMAL LOW
Lymphocytes Relative: 5 — ABNORMAL LOW
Lymphocytes Relative: 6 — ABNORMAL LOW
Lymphocytes Relative: 6 — ABNORMAL LOW
Lymphocytes Relative: 6 — ABNORMAL LOW
Lymphocytes Relative: 8 — ABNORMAL LOW
Lymphocytes Relative: 8 — ABNORMAL LOW
Lymphocytes Relative: 8 — ABNORMAL LOW
Lymphocytes Relative: 8 — ABNORMAL LOW
Lymphs Abs: 1.1
Lymphs Abs: 1.1
Lymphs Abs: 1.2
Lymphs Abs: 1.4
Lymphs Abs: 1.4
Lymphs Abs: 1.5
Lymphs Abs: 1.5
Lymphs Abs: 1.6
Lymphs Abs: 1.6
Lymphs Abs: 1.6
Lymphs Abs: 1.8
Lymphs Abs: 1.9
Lymphs Abs: 2.1
Lymphs Abs: 3.9
Monocytes Absolute: 0.4
Monocytes Absolute: 0.4
Monocytes Absolute: 0.5
Monocytes Absolute: 0.8
Monocytes Absolute: 0.9
Monocytes Absolute: 1
Monocytes Absolute: 1.1 — ABNORMAL HIGH
Monocytes Absolute: 1.1 — ABNORMAL HIGH
Monocytes Absolute: 1.1 — ABNORMAL HIGH
Monocytes Absolute: 1.1 — ABNORMAL HIGH
Monocytes Absolute: 1.2 — ABNORMAL HIGH
Monocytes Absolute: 1.2 — ABNORMAL HIGH
Monocytes Absolute: 1.2 — ABNORMAL HIGH
Monocytes Absolute: 1.2 — ABNORMAL HIGH
Monocytes Absolute: 1.3 — ABNORMAL HIGH
Monocytes Absolute: 1.5 — ABNORMAL HIGH
Monocytes Absolute: 2.7 — ABNORMAL HIGH
Monocytes Relative: 1 — ABNORMAL LOW
Monocytes Relative: 10
Monocytes Relative: 10
Monocytes Relative: 11
Monocytes Relative: 3
Monocytes Relative: 3
Monocytes Relative: 4
Monocytes Relative: 4
Monocytes Relative: 4
Monocytes Relative: 5
Monocytes Relative: 5
Monocytes Relative: 7
Monocytes Relative: 7
Monocytes Relative: 7
Monocytes Relative: 8
Monocytes Relative: 8
Neutro Abs: 10.1 — ABNORMAL HIGH
Neutro Abs: 10.5 — ABNORMAL HIGH
Neutro Abs: 12 — ABNORMAL HIGH
Neutro Abs: 12.2 — ABNORMAL HIGH
Neutro Abs: 14.2 — ABNORMAL HIGH
Neutro Abs: 15.5 — ABNORMAL HIGH
Neutro Abs: 15.7 — ABNORMAL HIGH
Neutro Abs: 16.4 — ABNORMAL HIGH
Neutro Abs: 16.4 — ABNORMAL HIGH
Neutro Abs: 20.7 — ABNORMAL HIGH
Neutro Abs: 21.7 — ABNORMAL HIGH
Neutro Abs: 22.1 — ABNORMAL HIGH
Neutro Abs: 9.6 — ABNORMAL HIGH
Neutro Abs: 9.8 — ABNORMAL HIGH
Neutro Abs: 9.9 — ABNORMAL HIGH
Neutrophils Relative %: 75
Neutrophils Relative %: 75
Neutrophils Relative %: 79 — ABNORMAL HIGH
Neutrophils Relative %: 81 — ABNORMAL HIGH
Neutrophils Relative %: 82 — ABNORMAL HIGH
Neutrophils Relative %: 82 — ABNORMAL HIGH
Neutrophils Relative %: 83 — ABNORMAL HIGH
Neutrophils Relative %: 83 — ABNORMAL HIGH
Neutrophils Relative %: 83 — ABNORMAL HIGH
Neutrophils Relative %: 83 — ABNORMAL HIGH
Neutrophils Relative %: 84 — ABNORMAL HIGH
Neutrophils Relative %: 86 — ABNORMAL HIGH
Neutrophils Relative %: 89 — ABNORMAL HIGH
Neutrophils Relative %: 91 — ABNORMAL HIGH
Neutrophils Relative %: 94 — ABNORMAL HIGH

## 2010-12-16 LAB — CBC
HCT: 21.7 — ABNORMAL LOW
HCT: 24.2 — ABNORMAL LOW
HCT: 25.1 — ABNORMAL LOW
HCT: 25.4 — ABNORMAL LOW
HCT: 28.5 — ABNORMAL LOW
HCT: 29.2 — ABNORMAL LOW
HCT: 33.6 — ABNORMAL LOW
HCT: 36
HCT: 36.2
HCT: 37.2
HCT: 38
Hemoglobin: 10.1 — ABNORMAL LOW
Hemoglobin: 10.2 — ABNORMAL LOW
Hemoglobin: 11.4 — ABNORMAL LOW
Hemoglobin: 12.1
Hemoglobin: 12.4
Hemoglobin: 12.5
Hemoglobin: 12.7
Hemoglobin: 6.9 — CL
Hemoglobin: 8.4 — ABNORMAL LOW
Hemoglobin: 8.8 — ABNORMAL LOW
Hemoglobin: 9 — ABNORMAL LOW
Hemoglobin: 9 — ABNORMAL LOW
Hemoglobin: 9.2 — ABNORMAL LOW
Hemoglobin: 9.4 — ABNORMAL LOW
Hemoglobin: 9.8 — ABNORMAL LOW
MCHC: 32.9
MCHC: 33.5
MCHC: 33.5
MCHC: 33.8
MCHC: 33.8
MCHC: 33.8
MCHC: 33.8
MCHC: 34
MCHC: 34.1
MCHC: 34.1
MCHC: 34.1
MCHC: 34.3
MCHC: 34.5
MCHC: 34.8
MCHC: 35
MCHC: 35.3
MCV: 86.2
MCV: 86.2
MCV: 86.4
MCV: 86.6
MCV: 86.7
MCV: 86.8
MCV: 86.9
MCV: 86.9
MCV: 87.2
MCV: 87.3
MCV: 87.3
MCV: 87.5
MCV: 87.7
MCV: 89.3
MCV: 90.3
MCV: 92.3
Platelets: 139 — ABNORMAL LOW
Platelets: 162
Platelets: 166
Platelets: 181
Platelets: 186
Platelets: 196
Platelets: 222
Platelets: 237
Platelets: 238
Platelets: 267
Platelets: 286
RBC: 2.37 — ABNORMAL LOW
RBC: 2.58 — ABNORMAL LOW
RBC: 3.04 — ABNORMAL LOW
RBC: 3.1 — ABNORMAL LOW
RBC: 3.11 — ABNORMAL LOW
RBC: 3.11 — ABNORMAL LOW
RBC: 3.16 — ABNORMAL LOW
RBC: 3.26 — ABNORMAL LOW
RBC: 3.27 — ABNORMAL LOW
RBC: 3.34 — ABNORMAL LOW
RBC: 3.48 — ABNORMAL LOW
RBC: 3.58 — ABNORMAL LOW
RBC: 4.1
RBC: 4.14
RBC: 4.38
RDW: 14.7
RDW: 14.9
RDW: 15.3
RDW: 15.4
RDW: 15.6 — ABNORMAL HIGH
RDW: 15.6 — ABNORMAL HIGH
RDW: 15.7 — ABNORMAL HIGH
RDW: 15.8 — ABNORMAL HIGH
RDW: 15.9 — ABNORMAL HIGH
RDW: 16 — ABNORMAL HIGH
RDW: 16.1 — ABNORMAL HIGH
RDW: 16.5 — ABNORMAL HIGH
RDW: 16.8 — ABNORMAL HIGH
RDW: 17.2 — ABNORMAL HIGH
WBC: 12.1 — ABNORMAL HIGH
WBC: 13.6 — ABNORMAL HIGH
WBC: 14.5 — ABNORMAL HIGH
WBC: 14.9 — ABNORMAL HIGH
WBC: 16.9 — ABNORMAL HIGH
WBC: 17 — ABNORMAL HIGH
WBC: 17.5 — ABNORMAL HIGH
WBC: 18 — ABNORMAL HIGH
WBC: 18.4 — ABNORMAL HIGH
WBC: 19.7 — ABNORMAL HIGH
WBC: 20 — ABNORMAL HIGH
WBC: 24.9 — ABNORMAL HIGH
WBC: 26.4 — ABNORMAL HIGH
WBC: 34.9 — ABNORMAL HIGH

## 2010-12-16 LAB — CULTURE, BLOOD (ROUTINE X 2): Culture: NO GROWTH

## 2010-12-16 LAB — GLUCOSE, CAPILLARY
Glucose-Capillary: 100 — ABNORMAL HIGH
Glucose-Capillary: 104 — ABNORMAL HIGH
Glucose-Capillary: 113 — ABNORMAL HIGH
Glucose-Capillary: 113 — ABNORMAL HIGH
Glucose-Capillary: 113 — ABNORMAL HIGH
Glucose-Capillary: 118 — ABNORMAL HIGH
Glucose-Capillary: 128 — ABNORMAL HIGH
Glucose-Capillary: 129 — ABNORMAL HIGH
Glucose-Capillary: 134 — ABNORMAL HIGH
Glucose-Capillary: 134 — ABNORMAL HIGH
Glucose-Capillary: 143 — ABNORMAL HIGH
Glucose-Capillary: 144 — ABNORMAL HIGH
Glucose-Capillary: 168 — ABNORMAL HIGH
Glucose-Capillary: 183 — ABNORMAL HIGH
Glucose-Capillary: 199 — ABNORMAL HIGH
Glucose-Capillary: 250 — ABNORMAL HIGH
Glucose-Capillary: 26 — CL
Glucose-Capillary: 69 — ABNORMAL LOW
Glucose-Capillary: 82
Glucose-Capillary: 86
Glucose-Capillary: 89
Glucose-Capillary: 91
Glucose-Capillary: 94
Glucose-Capillary: 99

## 2010-12-16 LAB — PREPARE FRESH FROZEN PLASMA

## 2010-12-16 LAB — COMPREHENSIVE METABOLIC PANEL
AST: 17
AST: 25
AST: 25
Albumin: 1.7 — ABNORMAL LOW
Albumin: 1.8 — ABNORMAL LOW
Albumin: 2 — ABNORMAL LOW
BUN: 98 — ABNORMAL HIGH
Calcium: 8.1 — ABNORMAL LOW
Calcium: 8.5
Chloride: 107
Creatinine, Ser: 2.27 — ABNORMAL HIGH
Creatinine, Ser: 4.38 — ABNORMAL HIGH
Creatinine, Ser: 4.41 — ABNORMAL HIGH
GFR calc Af Amer: 14 — ABNORMAL LOW
GFR calc Af Amer: 14 — ABNORMAL LOW
GFR calc Af Amer: 30 — ABNORMAL LOW
GFR calc non Af Amer: 12 — ABNORMAL LOW
Total Bilirubin: 0.4
Total Protein: 4.2 — ABNORMAL LOW
Total Protein: 4.5 — ABNORMAL LOW

## 2010-12-16 LAB — POCT I-STAT 4, (NA,K, GLUC, HGB,HCT)
Glucose, Bld: 78
HCT: 35 — ABNORMAL LOW
Hemoglobin: 11.9 — ABNORMAL LOW
Potassium: 4.8
Sodium: 135

## 2010-12-16 LAB — HEPATIC FUNCTION PANEL
ALT: 22
Albumin: 1 — ABNORMAL LOW
Alkaline Phosphatase: 39
Total Bilirubin: 0.6
Total Protein: 3.7 — ABNORMAL LOW

## 2010-12-16 LAB — HEMOGLOBIN AND HEMATOCRIT, BLOOD
HCT: 25.5 — ABNORMAL LOW
Hemoglobin: 8.9 — ABNORMAL LOW
Hemoglobin: 8.9 — ABNORMAL LOW

## 2010-12-16 LAB — CROSSMATCH
ABO/RH(D): O POS
Antibody Screen: NEGATIVE
Antibody Screen: NEGATIVE
Antibody Screen: NEGATIVE

## 2010-12-16 LAB — CULTURE, RESPIRATORY W GRAM STAIN

## 2010-12-16 LAB — OCCULT BLOOD X 1 CARD TO LAB, STOOL
Fecal Occult Bld: NEGATIVE
Fecal Occult Bld: NEGATIVE
Fecal Occult Bld: NEGATIVE

## 2010-12-16 LAB — LIPID PANEL
Cholesterol: 123
LDL Cholesterol: 66
Triglycerides: 136

## 2010-12-16 LAB — PHENYTOIN LEVEL, TOTAL
Phenytoin Lvl: 4.1 — ABNORMAL LOW
Phenytoin Lvl: 9 — ABNORMAL LOW

## 2010-12-16 LAB — URINALYSIS, ROUTINE W REFLEX MICROSCOPIC
Bilirubin Urine: NEGATIVE
Bilirubin Urine: NEGATIVE
Glucose, UA: 250 — AB
Glucose, UA: NEGATIVE
Ketones, ur: NEGATIVE
Ketones, ur: NEGATIVE
Nitrite: NEGATIVE
Protein, ur: >300 — AB
pH: 8

## 2010-12-16 LAB — URINE MICROSCOPIC-ADD ON

## 2010-12-16 LAB — PROTIME-INR
INR: 1.3
Prothrombin Time: 16.6 — ABNORMAL HIGH
Prothrombin Time: 17.3 — ABNORMAL HIGH
Prothrombin Time: 30 — ABNORMAL HIGH

## 2010-12-16 LAB — APTT: aPTT: 50 — ABNORMAL HIGH

## 2010-12-16 LAB — H. PYLORI ANTIBODY, IGG: H Pylori IgG: 0.4

## 2010-12-16 LAB — CLOSTRIDIUM DIFFICILE EIA

## 2010-12-16 LAB — CATH TIP CULTURE

## 2010-12-16 LAB — STOOL CULTURE

## 2010-12-16 LAB — TSH: TSH: 2.01

## 2010-12-17 LAB — CBC
HCT: 26.3 % — ABNORMAL LOW (ref 36.0–46.0)
HCT: 27.6 % — ABNORMAL LOW (ref 36.0–46.0)
HCT: 29.5 — ABNORMAL LOW
HCT: 29.8 — ABNORMAL LOW
Hemoglobin: 7.7 g/dL — CL (ref 12.0–15.0)
Hemoglobin: 9.5 g/dL — ABNORMAL LOW (ref 12.0–15.0)
Hemoglobin: 10.8 — ABNORMAL LOW
Hemoglobin: 7.8 — CL
Hemoglobin: 9.3 — ABNORMAL LOW
Hemoglobin: 9.9 — ABNORMAL LOW
Hemoglobin: 9.9 — ABNORMAL LOW
MCHC: 32.5 g/dL (ref 30.0–36.0)
MCHC: 32.7 g/dL (ref 30.0–36.0)
MCHC: 33.2 g/dL (ref 30.0–36.0)
MCHC: 33
MCHC: 33.1
MCHC: 33.2
MCHC: 33.3
MCV: 85.3 fL (ref 78.0–100.0)
MCV: 85.5 fL (ref 78.0–100.0)
MCV: 85.7 fL (ref 78.0–100.0)
MCV: 85.5
MCV: 85.8
MCV: 86.6
MCV: 91.9
Platelets: 520 10*3/uL — ABNORMAL HIGH (ref 150–400)
Platelets: 543 10*3/uL — ABNORMAL HIGH (ref 150–400)
Platelets: 577 10*3/uL — ABNORMAL HIGH (ref 150–400)
Platelets: 607 10*3/uL — ABNORMAL HIGH (ref 150–400)
Platelets: 414 — ABNORMAL HIGH
Platelets: 437 — ABNORMAL HIGH
Platelets: 442 — ABNORMAL HIGH
RBC: 2.74 MIL/uL — ABNORMAL LOW (ref 3.87–5.11)
RBC: 3.35 MIL/uL — ABNORMAL LOW (ref 3.87–5.11)
RBC: 2.66 — ABNORMAL LOW
RBC: 3.06 — ABNORMAL LOW
RBC: 3.13 — ABNORMAL LOW
RBC: 3.22 — ABNORMAL LOW
RBC: 3.5 — ABNORMAL LOW
RBC: 3.74 — ABNORMAL LOW
RDW: 17.2 % — ABNORMAL HIGH (ref 11.5–15.5)
RDW: 17.5 % — ABNORMAL HIGH (ref 11.5–15.5)
RDW: 17.7 % — ABNORMAL HIGH (ref 11.5–15.5)
RDW: 15.4
RDW: 16.9 — ABNORMAL HIGH
RDW: 17.3 — ABNORMAL HIGH
WBC: 16.9 10*3/uL — ABNORMAL HIGH (ref 4.0–10.5)
WBC: 20.9 10*3/uL — ABNORMAL HIGH (ref 4.0–10.5)
WBC: 13.9 — ABNORMAL HIGH
WBC: 15.8 — ABNORMAL HIGH
WBC: 19.4 — ABNORMAL HIGH
WBC: 20 — ABNORMAL HIGH
WBC: 21.1 — ABNORMAL HIGH

## 2010-12-17 LAB — CULTURE, BLOOD (ROUTINE X 2)
Culture: NO GROWTH
Report Status: 11302009
Report Status: 11222009

## 2010-12-17 LAB — DIFFERENTIAL
Basophils Absolute: 0 10*3/uL (ref 0.0–0.1)
Basophils Absolute: 0.1 10*3/uL (ref 0.0–0.1)
Basophils Absolute: 0.1 10*3/uL (ref 0.0–0.1)
Basophils Absolute: 0
Basophils Absolute: 0
Basophils Absolute: 0
Basophils Absolute: 0.1
Basophils Relative: 0 % (ref 0–1)
Basophils Relative: 1 % (ref 0–1)
Basophils Relative: 0
Basophils Relative: 1
Basophils Relative: 1
Eosinophils Absolute: 0.5 10*3/uL (ref 0.0–0.7)
Eosinophils Absolute: 0.7 10*3/uL (ref 0.0–0.7)
Eosinophils Absolute: 0.4
Eosinophils Absolute: 0.5
Eosinophils Absolute: 0.6
Eosinophils Absolute: 0.9 — ABNORMAL HIGH
Eosinophils Relative: 2 % (ref 0–5)
Eosinophils Relative: 3 % (ref 0–5)
Eosinophils Relative: 3 % (ref 0–5)
Eosinophils Relative: 4 % (ref 0–5)
Eosinophils Relative: 3
Eosinophils Relative: 6 — ABNORMAL HIGH
Lymphocytes Relative: 5 % — ABNORMAL LOW (ref 12–46)
Lymphocytes Relative: 5 % — ABNORMAL LOW (ref 12–46)
Lymphocytes Relative: 6 % — ABNORMAL LOW (ref 12–46)
Lymphocytes Relative: 10 — ABNORMAL LOW
Lymphocytes Relative: 15
Lymphocytes Relative: 7 — ABNORMAL LOW
Lymphocytes Relative: 8 — ABNORMAL LOW
Lymphs Abs: 1.1 10*3/uL (ref 0.7–4.0)
Lymphs Abs: 1.4 10*3/uL (ref 0.7–4.0)
Lymphs Abs: 1.1
Lymphs Abs: 1.3
Lymphs Abs: 1.5
Lymphs Abs: 1.6
Lymphs Abs: 1.7
Monocytes Absolute: 1.4 10*3/uL — ABNORMAL HIGH (ref 0.1–1.0)
Monocytes Absolute: 1.1 — ABNORMAL HIGH
Monocytes Absolute: 1.2 — ABNORMAL HIGH
Monocytes Absolute: 2.1 — ABNORMAL HIGH
Monocytes Relative: 7 % (ref 3–12)
Monocytes Relative: 9 % (ref 3–12)
Monocytes Relative: 12
Monocytes Relative: 13 — ABNORMAL HIGH
Monocytes Relative: 6
Monocytes Relative: 7
Monocytes Relative: 9
Neutro Abs: 17.1 10*3/uL — ABNORMAL HIGH (ref 1.7–7.7)
Neutro Abs: 18.1 10*3/uL — ABNORMAL HIGH (ref 1.7–7.7)
Neutro Abs: 14.2 — ABNORMAL HIGH
Neutro Abs: 14.6 — ABNORMAL HIGH
Neutro Abs: 16.2 — ABNORMAL HIGH
Neutro Abs: 16.3 — ABNORMAL HIGH
Neutro Abs: 8.8 — ABNORMAL HIGH
Neutro Abs: 9.8 — ABNORMAL HIGH
Neutrophils Relative %: 81 % — ABNORMAL HIGH (ref 43–77)
Neutrophils Relative %: 81 % — ABNORMAL HIGH (ref 43–77)
Neutrophils Relative %: 85 % — ABNORMAL HIGH (ref 43–77)
Neutrophils Relative %: 68
Neutrophils Relative %: 71
Neutrophils Relative %: 81 — ABNORMAL HIGH
Neutrophils Relative %: 81 — ABNORMAL HIGH
Neutrophils Relative %: 81 — ABNORMAL HIGH
Neutrophils Relative %: 84 — ABNORMAL HIGH
Neutrophils Relative %: 86 — ABNORMAL HIGH

## 2010-12-17 LAB — CARDIAC PANEL(CRET KIN+CKTOT+MB+TROPI)
CK, MB: 0.9 ng/mL (ref 0.3–4.0)
Relative Index: INVALID (ref 0.0–2.5)
Relative Index: INVALID
Relative Index: INVALID
Total CK: 56 U/L (ref 7–177)
Total CK: 107
Total CK: 46
Troponin I: 0.02 ng/mL (ref 0.00–0.06)
Troponin I: 0.02
Troponin I: <0.01

## 2010-12-17 LAB — CROSSMATCH
ABO/RH(D): O POS
Antibody Screen: NEGATIVE
Antibody Screen: NEGATIVE

## 2010-12-17 LAB — URINE CULTURE
Colony Count: 15000
Colony Count: NO GROWTH
Culture: NO GROWTH

## 2010-12-17 LAB — BASIC METABOLIC PANEL
BUN: 12 mg/dL (ref 6–23)
BUN: 19 mg/dL (ref 6–23)
BUN: 17
BUN: 22
BUN: 30 — ABNORMAL HIGH
CO2: 24 mEq/L (ref 19–32)
CO2: 25
CO2: 28
CO2: 29
Calcium: 8.2 mg/dL — ABNORMAL LOW (ref 8.4–10.5)
Calcium: 8.3 — ABNORMAL LOW
Calcium: 8.5
Calcium: 8.6
Calcium: 8.8
Calcium: 9
Calcium: 9
Chloride: 108 mEq/L (ref 96–112)
Chloride: 100
Chloride: 105
Chloride: 105
Creatinine, Ser: 2.39 mg/dL — ABNORMAL HIGH (ref 0.4–1.2)
Creatinine, Ser: 2.02 — ABNORMAL HIGH
Creatinine, Ser: 2.46 — ABNORMAL HIGH
Creatinine, Ser: 2.63 — ABNORMAL HIGH
Creatinine, Ser: 3.1 — ABNORMAL HIGH
GFR calc Af Amer: 17 — ABNORMAL LOW
GFR calc Af Amer: 21 — ABNORMAL LOW
GFR calc Af Amer: 25 — ABNORMAL LOW
GFR calc Af Amer: 27 — ABNORMAL LOW
GFR calc Af Amer: 34 — ABNORMAL LOW
GFR calc non Af Amer: 14 mL/min — ABNORMAL LOW (ref >60)
GFR calc non Af Amer: 14 — ABNORMAL LOW
GFR calc non Af Amer: 16 — ABNORMAL LOW
GFR calc non Af Amer: 17 — ABNORMAL LOW
GFR calc non Af Amer: 19 — ABNORMAL LOW
GFR calc non Af Amer: 28 — ABNORMAL LOW
Glucose, Bld: 134 mg/dL — ABNORMAL HIGH (ref 70–99)
Glucose, Bld: 80
Glucose, Bld: 84
Potassium: 3.6
Potassium: 3.8
Sodium: 138
Sodium: 138
Sodium: 138
Sodium: 140
Sodium: 141

## 2010-12-17 LAB — COMPREHENSIVE METABOLIC PANEL
ALT: 18 U/L (ref 0–35)
ALT: 35 U/L (ref 0–35)
ALT: 16
AST: 17 U/L (ref 0–37)
AST: 23 U/L (ref 0–37)
Albumin: 1 g/dL — ABNORMAL LOW (ref 3.5–5.2)
Alkaline Phosphatase: 79 U/L (ref 39–117)
Alkaline Phosphatase: 79
CO2: 21 mEq/L (ref 19–32)
CO2: 23 mEq/L (ref 19–32)
CO2: 26
Calcium: 8 mg/dL — ABNORMAL LOW (ref 8.4–10.5)
Calcium: 8.3 mg/dL — ABNORMAL LOW (ref 8.4–10.5)
Calcium: 8.8 mg/dL (ref 8.4–10.5)
Calcium: 9.2
Chloride: 103 mEq/L (ref 96–112)
Chloride: 110 mEq/L (ref 96–112)
Chloride: 104
Creatinine, Ser: 1.82 mg/dL — ABNORMAL HIGH (ref 0.4–1.2)
Creatinine, Ser: 3.72 mg/dL — ABNORMAL HIGH (ref 0.4–1.2)
GFR calc Af Amer: 17 mL/min — ABNORMAL LOW (ref >60)
GFR calc Af Amer: 25 mL/min — ABNORMAL LOW (ref >60)
GFR calc Af Amer: 39 mL/min — ABNORMAL LOW (ref >60)
GFR calc non Af Amer: 14 mL/min — ABNORMAL LOW (ref >60)
GFR calc non Af Amer: 21 mL/min — ABNORMAL LOW (ref >60)
GFR calc non Af Amer: 24 — ABNORMAL LOW
Glucose, Bld: 100 mg/dL — ABNORMAL HIGH (ref 70–99)
Glucose, Bld: 99 mg/dL (ref 70–99)
Glucose, Bld: 91
Potassium: 4 mEq/L (ref 3.5–5.1)
Potassium: 4.1
Sodium: 132 mEq/L — ABNORMAL LOW (ref 135–145)
Sodium: 140 mEq/L (ref 135–145)
Sodium: 141
Total Bilirubin: 0.5 mg/dL (ref 0.3–1.2)
Total Bilirubin: 0.5 mg/dL (ref 0.3–1.2)
Total Bilirubin: 0.4
Total Protein: 4.9 g/dL — ABNORMAL LOW (ref 6.0–8.3)
Total Protein: 6.6 g/dL (ref 6.0–8.3)

## 2010-12-17 LAB — PROTIME-INR
INR: 1.3
Prothrombin Time: 19.6 seconds — ABNORMAL HIGH (ref 11.6–15.2)
Prothrombin Time: 17.1 — ABNORMAL HIGH
Prothrombin Time: 17.8 — ABNORMAL HIGH

## 2010-12-17 LAB — BODY FLUID CELL COUNT WITH DIFFERENTIAL
Lymphs, Fluid: 8
Neutrophil Count, Fluid: 52 — ABNORMAL HIGH
Total Nucleated Cell Count, Fluid: 1120 — ABNORMAL HIGH

## 2010-12-17 LAB — POCT CARDIAC MARKERS
CKMB, poc: 1.2 ng/mL (ref 1.0–8.0)
Myoglobin, poc: >500 ng/mL (ref 12–200)
Myoglobin, poc: 259
Troponin i, poc: <0.05

## 2010-12-17 LAB — GLUCOSE, CAPILLARY
Glucose-Capillary: 147 — ABNORMAL HIGH
Glucose-Capillary: 89
Glucose-Capillary: 96

## 2010-12-17 LAB — URINALYSIS, ROUTINE W REFLEX MICROSCOPIC
Bilirubin Urine: NEGATIVE
Glucose, UA: NEGATIVE
Ketones, ur: 15 mg/dL — AB
Nitrite: NEGATIVE
Nitrite: NEGATIVE
Protein, ur: >300 mg/dL — AB
Protein, ur: >300 — AB
pH: 8

## 2010-12-17 LAB — BODY FLUID CULTURE: Culture: NO GROWTH

## 2010-12-17 LAB — BLOOD GAS, ARTERIAL
O2 Saturation: 87.3 %
Patient temperature: 37
pH, Arterial: 7.48 — ABNORMAL HIGH (ref 7.350–7.400)
pO2, Arterial: 53.1 mmHg — ABNORMAL LOW (ref 80.0–100.0)

## 2010-12-17 LAB — PHENYTOIN LEVEL, TOTAL: Phenytoin Lvl: 14.3 ug/mL (ref 10.0–20.0)

## 2010-12-17 LAB — VANCOMYCIN, TROUGH: Vancomycin Tr: 15.2

## 2010-12-17 LAB — APTT: aPTT: 46 — ABNORMAL HIGH

## 2010-12-17 LAB — URINE MICROSCOPIC-ADD ON

## 2010-12-20 LAB — CBC
HCT: 33.3 % — ABNORMAL LOW (ref 36.0–46.0)
HCT: 35.9 % — ABNORMAL LOW (ref 36.0–46.0)
Hemoglobin: 11.1 g/dL — ABNORMAL LOW (ref 12.0–15.0)
MCHC: 32.9 g/dL (ref 30.0–36.0)
MCV: 87.2 fL (ref 78.0–100.0)
Platelets: 591 10*3/uL — ABNORMAL HIGH (ref 150–400)
RBC: 3.83 MIL/uL — ABNORMAL LOW (ref 3.87–5.11)
RDW: 16.7 % — ABNORMAL HIGH (ref 11.5–15.5)
WBC: 14.9 10*3/uL — ABNORMAL HIGH (ref 4.0–10.5)

## 2010-12-20 LAB — BASIC METABOLIC PANEL
BUN: 8 mg/dL (ref 6–23)
CO2: 25 mEq/L (ref 19–32)
Chloride: 109 mEq/L (ref 96–112)
Creatinine, Ser: 1.9 mg/dL — ABNORMAL HIGH (ref 0.4–1.2)
GFR calc Af Amer: 41 mL/min — ABNORMAL LOW (ref >60)
GFR calc non Af Amer: 33 mL/min — ABNORMAL LOW (ref >60)
Glucose, Bld: 66 mg/dL — ABNORMAL LOW (ref 70–99)
Glucose, Bld: 67 mg/dL — ABNORMAL LOW (ref 70–99)
Potassium: 3.7 mEq/L (ref 3.5–5.1)
Potassium: 3.8 mEq/L (ref 3.5–5.1)
Sodium: 138 mEq/L (ref 135–145)

## 2010-12-20 LAB — DIFFERENTIAL
Basophils Relative: 0 % (ref 0–1)
Eosinophils Absolute: 0.5 10*3/uL (ref 0.0–0.7)
Eosinophils Relative: 3 % (ref 0–5)
Eosinophils Relative: 5 % (ref 0–5)
Lymphocytes Relative: 11 % — ABNORMAL LOW (ref 12–46)
Lymphs Abs: 1.6 10*3/uL (ref 0.7–4.0)
Monocytes Absolute: 1.3 10*3/uL — ABNORMAL HIGH (ref 0.1–1.0)
Monocytes Relative: 8 % (ref 3–12)
Neutrophils Relative %: 80 % — ABNORMAL HIGH (ref 43–77)

## 2010-12-25 LAB — CBC
HCT: 39
Hemoglobin: 12.7
MCHC: 32.5
RBC: 4.74
RDW: 15.1 — ABNORMAL HIGH

## 2010-12-25 LAB — DIFFERENTIAL
Basophils Absolute: 0
Basophils Relative: 0
Lymphocytes Relative: 21
Lymphs Abs: 1.2
Monocytes Relative: 10
Neutro Abs: 4

## 2010-12-25 LAB — BASIC METABOLIC PANEL
CO2: 26
GFR calc Af Amer: >60
GFR calc non Af Amer: >60
Glucose, Bld: 79
Potassium: 3.6
Sodium: 144

## 2010-12-27 DIAGNOSIS — M3210 Systemic lupus erythematosus, organ or system involvement unspecified: Secondary | ICD-10-CM | POA: Insufficient documentation

## 2010-12-27 DIAGNOSIS — D631 Anemia in chronic kidney disease: Secondary | ICD-10-CM | POA: Insufficient documentation

## 2010-12-27 DIAGNOSIS — Z992 Dependence on renal dialysis: Secondary | ICD-10-CM | POA: Insufficient documentation

## 2010-12-27 DIAGNOSIS — E119 Type 2 diabetes mellitus without complications: Secondary | ICD-10-CM | POA: Insufficient documentation

## 2010-12-27 DIAGNOSIS — F329 Major depressive disorder, single episode, unspecified: Secondary | ICD-10-CM | POA: Insufficient documentation

## 2010-12-27 DIAGNOSIS — N186 End stage renal disease: Secondary | ICD-10-CM | POA: Insufficient documentation

## 2010-12-27 DIAGNOSIS — N2581 Secondary hyperparathyroidism of renal origin: Secondary | ICD-10-CM | POA: Insufficient documentation

## 2011-03-19 DIAGNOSIS — D509 Iron deficiency anemia, unspecified: Secondary | ICD-10-CM | POA: Diagnosis not present

## 2011-03-19 DIAGNOSIS — N186 End stage renal disease: Secondary | ICD-10-CM | POA: Diagnosis not present

## 2011-03-19 DIAGNOSIS — N2581 Secondary hyperparathyroidism of renal origin: Secondary | ICD-10-CM | POA: Diagnosis not present

## 2011-03-19 DIAGNOSIS — D631 Anemia in chronic kidney disease: Secondary | ICD-10-CM | POA: Diagnosis not present

## 2011-03-26 DIAGNOSIS — E119 Type 2 diabetes mellitus without complications: Secondary | ICD-10-CM | POA: Diagnosis not present

## 2011-04-17 DIAGNOSIS — N186 End stage renal disease: Secondary | ICD-10-CM | POA: Diagnosis not present

## 2011-04-18 DIAGNOSIS — N2581 Secondary hyperparathyroidism of renal origin: Secondary | ICD-10-CM | POA: Diagnosis not present

## 2011-04-18 DIAGNOSIS — D509 Iron deficiency anemia, unspecified: Secondary | ICD-10-CM | POA: Diagnosis not present

## 2011-04-18 DIAGNOSIS — N186 End stage renal disease: Secondary | ICD-10-CM | POA: Diagnosis not present

## 2011-04-18 DIAGNOSIS — D631 Anemia in chronic kidney disease: Secondary | ICD-10-CM | POA: Diagnosis not present

## 2011-04-18 DIAGNOSIS — N039 Chronic nephritic syndrome with unspecified morphologic changes: Secondary | ICD-10-CM | POA: Diagnosis not present

## 2011-04-21 DIAGNOSIS — N186 End stage renal disease: Secondary | ICD-10-CM | POA: Diagnosis not present

## 2011-04-21 DIAGNOSIS — D509 Iron deficiency anemia, unspecified: Secondary | ICD-10-CM | POA: Diagnosis not present

## 2011-04-21 DIAGNOSIS — N039 Chronic nephritic syndrome with unspecified morphologic changes: Secondary | ICD-10-CM | POA: Diagnosis not present

## 2011-04-21 DIAGNOSIS — N2581 Secondary hyperparathyroidism of renal origin: Secondary | ICD-10-CM | POA: Diagnosis not present

## 2011-04-21 DIAGNOSIS — D631 Anemia in chronic kidney disease: Secondary | ICD-10-CM | POA: Diagnosis not present

## 2011-04-23 DIAGNOSIS — D631 Anemia in chronic kidney disease: Secondary | ICD-10-CM | POA: Diagnosis not present

## 2011-04-23 DIAGNOSIS — N186 End stage renal disease: Secondary | ICD-10-CM | POA: Diagnosis not present

## 2011-04-23 DIAGNOSIS — E119 Type 2 diabetes mellitus without complications: Secondary | ICD-10-CM | POA: Diagnosis not present

## 2011-04-23 DIAGNOSIS — N039 Chronic nephritic syndrome with unspecified morphologic changes: Secondary | ICD-10-CM | POA: Diagnosis not present

## 2011-04-23 DIAGNOSIS — D509 Iron deficiency anemia, unspecified: Secondary | ICD-10-CM | POA: Diagnosis not present

## 2011-04-23 DIAGNOSIS — N2581 Secondary hyperparathyroidism of renal origin: Secondary | ICD-10-CM | POA: Diagnosis not present

## 2011-04-25 DIAGNOSIS — D509 Iron deficiency anemia, unspecified: Secondary | ICD-10-CM | POA: Diagnosis not present

## 2011-04-25 DIAGNOSIS — D631 Anemia in chronic kidney disease: Secondary | ICD-10-CM | POA: Diagnosis not present

## 2011-04-25 DIAGNOSIS — N2581 Secondary hyperparathyroidism of renal origin: Secondary | ICD-10-CM | POA: Diagnosis not present

## 2011-04-25 DIAGNOSIS — N186 End stage renal disease: Secondary | ICD-10-CM | POA: Diagnosis not present

## 2011-04-25 DIAGNOSIS — N039 Chronic nephritic syndrome with unspecified morphologic changes: Secondary | ICD-10-CM | POA: Diagnosis not present

## 2011-04-28 DIAGNOSIS — D509 Iron deficiency anemia, unspecified: Secondary | ICD-10-CM | POA: Diagnosis not present

## 2011-04-28 DIAGNOSIS — N039 Chronic nephritic syndrome with unspecified morphologic changes: Secondary | ICD-10-CM | POA: Diagnosis not present

## 2011-04-28 DIAGNOSIS — N186 End stage renal disease: Secondary | ICD-10-CM | POA: Diagnosis not present

## 2011-04-28 DIAGNOSIS — D631 Anemia in chronic kidney disease: Secondary | ICD-10-CM | POA: Diagnosis not present

## 2011-04-28 DIAGNOSIS — N2581 Secondary hyperparathyroidism of renal origin: Secondary | ICD-10-CM | POA: Diagnosis not present

## 2011-04-30 DIAGNOSIS — D509 Iron deficiency anemia, unspecified: Secondary | ICD-10-CM | POA: Diagnosis not present

## 2011-04-30 DIAGNOSIS — N186 End stage renal disease: Secondary | ICD-10-CM | POA: Diagnosis not present

## 2011-04-30 DIAGNOSIS — N2581 Secondary hyperparathyroidism of renal origin: Secondary | ICD-10-CM | POA: Diagnosis not present

## 2011-04-30 DIAGNOSIS — D631 Anemia in chronic kidney disease: Secondary | ICD-10-CM | POA: Diagnosis not present

## 2011-05-02 DIAGNOSIS — D509 Iron deficiency anemia, unspecified: Secondary | ICD-10-CM | POA: Diagnosis not present

## 2011-05-02 DIAGNOSIS — N2581 Secondary hyperparathyroidism of renal origin: Secondary | ICD-10-CM | POA: Diagnosis not present

## 2011-05-02 DIAGNOSIS — D631 Anemia in chronic kidney disease: Secondary | ICD-10-CM | POA: Diagnosis not present

## 2011-05-02 DIAGNOSIS — N186 End stage renal disease: Secondary | ICD-10-CM | POA: Diagnosis not present

## 2011-05-05 DIAGNOSIS — D631 Anemia in chronic kidney disease: Secondary | ICD-10-CM | POA: Diagnosis not present

## 2011-05-05 DIAGNOSIS — N186 End stage renal disease: Secondary | ICD-10-CM | POA: Diagnosis not present

## 2011-05-05 DIAGNOSIS — D509 Iron deficiency anemia, unspecified: Secondary | ICD-10-CM | POA: Diagnosis not present

## 2011-05-05 DIAGNOSIS — N2581 Secondary hyperparathyroidism of renal origin: Secondary | ICD-10-CM | POA: Diagnosis not present

## 2011-05-05 DIAGNOSIS — N039 Chronic nephritic syndrome with unspecified morphologic changes: Secondary | ICD-10-CM | POA: Diagnosis not present

## 2011-05-07 DIAGNOSIS — N2581 Secondary hyperparathyroidism of renal origin: Secondary | ICD-10-CM | POA: Diagnosis not present

## 2011-05-07 DIAGNOSIS — N186 End stage renal disease: Secondary | ICD-10-CM | POA: Diagnosis not present

## 2011-05-07 DIAGNOSIS — N039 Chronic nephritic syndrome with unspecified morphologic changes: Secondary | ICD-10-CM | POA: Diagnosis not present

## 2011-05-07 DIAGNOSIS — D509 Iron deficiency anemia, unspecified: Secondary | ICD-10-CM | POA: Diagnosis not present

## 2011-05-07 DIAGNOSIS — D631 Anemia in chronic kidney disease: Secondary | ICD-10-CM | POA: Diagnosis not present

## 2011-05-09 DIAGNOSIS — D631 Anemia in chronic kidney disease: Secondary | ICD-10-CM | POA: Diagnosis not present

## 2011-05-09 DIAGNOSIS — N186 End stage renal disease: Secondary | ICD-10-CM | POA: Diagnosis not present

## 2011-05-09 DIAGNOSIS — D509 Iron deficiency anemia, unspecified: Secondary | ICD-10-CM | POA: Diagnosis not present

## 2011-05-09 DIAGNOSIS — N2581 Secondary hyperparathyroidism of renal origin: Secondary | ICD-10-CM | POA: Diagnosis not present

## 2011-05-12 DIAGNOSIS — N186 End stage renal disease: Secondary | ICD-10-CM | POA: Diagnosis not present

## 2011-05-12 DIAGNOSIS — D631 Anemia in chronic kidney disease: Secondary | ICD-10-CM | POA: Diagnosis not present

## 2011-05-12 DIAGNOSIS — D509 Iron deficiency anemia, unspecified: Secondary | ICD-10-CM | POA: Diagnosis not present

## 2011-05-12 DIAGNOSIS — N2581 Secondary hyperparathyroidism of renal origin: Secondary | ICD-10-CM | POA: Diagnosis not present

## 2011-05-14 DIAGNOSIS — N039 Chronic nephritic syndrome with unspecified morphologic changes: Secondary | ICD-10-CM | POA: Diagnosis not present

## 2011-05-14 DIAGNOSIS — N186 End stage renal disease: Secondary | ICD-10-CM | POA: Diagnosis not present

## 2011-05-14 DIAGNOSIS — N2581 Secondary hyperparathyroidism of renal origin: Secondary | ICD-10-CM | POA: Diagnosis not present

## 2011-05-14 DIAGNOSIS — D631 Anemia in chronic kidney disease: Secondary | ICD-10-CM | POA: Diagnosis not present

## 2011-05-14 DIAGNOSIS — D509 Iron deficiency anemia, unspecified: Secondary | ICD-10-CM | POA: Diagnosis not present

## 2011-05-15 DIAGNOSIS — N186 End stage renal disease: Secondary | ICD-10-CM | POA: Diagnosis not present

## 2011-05-16 DIAGNOSIS — D631 Anemia in chronic kidney disease: Secondary | ICD-10-CM | POA: Diagnosis not present

## 2011-05-16 DIAGNOSIS — N186 End stage renal disease: Secondary | ICD-10-CM | POA: Diagnosis not present

## 2011-05-16 DIAGNOSIS — N2581 Secondary hyperparathyroidism of renal origin: Secondary | ICD-10-CM | POA: Diagnosis not present

## 2011-05-16 DIAGNOSIS — D509 Iron deficiency anemia, unspecified: Secondary | ICD-10-CM | POA: Diagnosis not present

## 2011-05-19 DIAGNOSIS — D509 Iron deficiency anemia, unspecified: Secondary | ICD-10-CM | POA: Diagnosis not present

## 2011-05-19 DIAGNOSIS — N186 End stage renal disease: Secondary | ICD-10-CM | POA: Diagnosis not present

## 2011-05-19 DIAGNOSIS — D631 Anemia in chronic kidney disease: Secondary | ICD-10-CM | POA: Diagnosis not present

## 2011-05-19 DIAGNOSIS — N2581 Secondary hyperparathyroidism of renal origin: Secondary | ICD-10-CM | POA: Diagnosis not present

## 2011-05-21 DIAGNOSIS — D509 Iron deficiency anemia, unspecified: Secondary | ICD-10-CM | POA: Diagnosis not present

## 2011-05-21 DIAGNOSIS — D631 Anemia in chronic kidney disease: Secondary | ICD-10-CM | POA: Diagnosis not present

## 2011-05-21 DIAGNOSIS — N186 End stage renal disease: Secondary | ICD-10-CM | POA: Diagnosis not present

## 2011-05-21 DIAGNOSIS — E119 Type 2 diabetes mellitus without complications: Secondary | ICD-10-CM | POA: Diagnosis not present

## 2011-05-21 DIAGNOSIS — N2581 Secondary hyperparathyroidism of renal origin: Secondary | ICD-10-CM | POA: Diagnosis not present

## 2011-05-23 DIAGNOSIS — D631 Anemia in chronic kidney disease: Secondary | ICD-10-CM | POA: Diagnosis not present

## 2011-05-23 DIAGNOSIS — N039 Chronic nephritic syndrome with unspecified morphologic changes: Secondary | ICD-10-CM | POA: Diagnosis not present

## 2011-05-23 DIAGNOSIS — D509 Iron deficiency anemia, unspecified: Secondary | ICD-10-CM | POA: Diagnosis not present

## 2011-05-23 DIAGNOSIS — N186 End stage renal disease: Secondary | ICD-10-CM | POA: Diagnosis not present

## 2011-05-23 DIAGNOSIS — N2581 Secondary hyperparathyroidism of renal origin: Secondary | ICD-10-CM | POA: Diagnosis not present

## 2011-05-26 DIAGNOSIS — D509 Iron deficiency anemia, unspecified: Secondary | ICD-10-CM | POA: Diagnosis not present

## 2011-05-26 DIAGNOSIS — N186 End stage renal disease: Secondary | ICD-10-CM | POA: Diagnosis not present

## 2011-05-26 DIAGNOSIS — D631 Anemia in chronic kidney disease: Secondary | ICD-10-CM | POA: Diagnosis not present

## 2011-05-26 DIAGNOSIS — N2581 Secondary hyperparathyroidism of renal origin: Secondary | ICD-10-CM | POA: Diagnosis not present

## 2011-05-28 DIAGNOSIS — D509 Iron deficiency anemia, unspecified: Secondary | ICD-10-CM | POA: Diagnosis not present

## 2011-05-28 DIAGNOSIS — D631 Anemia in chronic kidney disease: Secondary | ICD-10-CM | POA: Diagnosis not present

## 2011-05-28 DIAGNOSIS — N186 End stage renal disease: Secondary | ICD-10-CM | POA: Diagnosis not present

## 2011-05-28 DIAGNOSIS — N2581 Secondary hyperparathyroidism of renal origin: Secondary | ICD-10-CM | POA: Diagnosis not present

## 2011-05-30 DIAGNOSIS — N039 Chronic nephritic syndrome with unspecified morphologic changes: Secondary | ICD-10-CM | POA: Diagnosis not present

## 2011-05-30 DIAGNOSIS — N186 End stage renal disease: Secondary | ICD-10-CM | POA: Diagnosis not present

## 2011-05-30 DIAGNOSIS — D509 Iron deficiency anemia, unspecified: Secondary | ICD-10-CM | POA: Diagnosis not present

## 2011-05-30 DIAGNOSIS — N2581 Secondary hyperparathyroidism of renal origin: Secondary | ICD-10-CM | POA: Diagnosis not present

## 2011-05-30 DIAGNOSIS — D631 Anemia in chronic kidney disease: Secondary | ICD-10-CM | POA: Diagnosis not present

## 2011-06-02 DIAGNOSIS — D631 Anemia in chronic kidney disease: Secondary | ICD-10-CM | POA: Diagnosis not present

## 2011-06-02 DIAGNOSIS — D509 Iron deficiency anemia, unspecified: Secondary | ICD-10-CM | POA: Diagnosis not present

## 2011-06-02 DIAGNOSIS — N186 End stage renal disease: Secondary | ICD-10-CM | POA: Diagnosis not present

## 2011-06-02 DIAGNOSIS — N2581 Secondary hyperparathyroidism of renal origin: Secondary | ICD-10-CM | POA: Diagnosis not present

## 2011-06-04 DIAGNOSIS — N2581 Secondary hyperparathyroidism of renal origin: Secondary | ICD-10-CM | POA: Diagnosis not present

## 2011-06-04 DIAGNOSIS — N186 End stage renal disease: Secondary | ICD-10-CM | POA: Diagnosis not present

## 2011-06-04 DIAGNOSIS — D631 Anemia in chronic kidney disease: Secondary | ICD-10-CM | POA: Diagnosis not present

## 2011-06-04 DIAGNOSIS — N039 Chronic nephritic syndrome with unspecified morphologic changes: Secondary | ICD-10-CM | POA: Diagnosis not present

## 2011-06-04 DIAGNOSIS — D509 Iron deficiency anemia, unspecified: Secondary | ICD-10-CM | POA: Diagnosis not present

## 2011-06-06 DIAGNOSIS — N186 End stage renal disease: Secondary | ICD-10-CM | POA: Diagnosis not present

## 2011-06-06 DIAGNOSIS — D631 Anemia in chronic kidney disease: Secondary | ICD-10-CM | POA: Diagnosis not present

## 2011-06-06 DIAGNOSIS — N2581 Secondary hyperparathyroidism of renal origin: Secondary | ICD-10-CM | POA: Diagnosis not present

## 2011-06-06 DIAGNOSIS — N039 Chronic nephritic syndrome with unspecified morphologic changes: Secondary | ICD-10-CM | POA: Diagnosis not present

## 2011-06-06 DIAGNOSIS — D509 Iron deficiency anemia, unspecified: Secondary | ICD-10-CM | POA: Diagnosis not present

## 2011-06-09 DIAGNOSIS — N2581 Secondary hyperparathyroidism of renal origin: Secondary | ICD-10-CM | POA: Diagnosis not present

## 2011-06-09 DIAGNOSIS — D509 Iron deficiency anemia, unspecified: Secondary | ICD-10-CM | POA: Diagnosis not present

## 2011-06-09 DIAGNOSIS — D631 Anemia in chronic kidney disease: Secondary | ICD-10-CM | POA: Diagnosis not present

## 2011-06-09 DIAGNOSIS — N186 End stage renal disease: Secondary | ICD-10-CM | POA: Diagnosis not present

## 2011-06-11 DIAGNOSIS — D509 Iron deficiency anemia, unspecified: Secondary | ICD-10-CM | POA: Diagnosis not present

## 2011-06-11 DIAGNOSIS — N2581 Secondary hyperparathyroidism of renal origin: Secondary | ICD-10-CM | POA: Diagnosis not present

## 2011-06-11 DIAGNOSIS — D631 Anemia in chronic kidney disease: Secondary | ICD-10-CM | POA: Diagnosis not present

## 2011-06-11 DIAGNOSIS — N186 End stage renal disease: Secondary | ICD-10-CM | POA: Diagnosis not present

## 2011-06-13 DIAGNOSIS — N186 End stage renal disease: Secondary | ICD-10-CM | POA: Diagnosis not present

## 2011-06-13 DIAGNOSIS — D509 Iron deficiency anemia, unspecified: Secondary | ICD-10-CM | POA: Diagnosis not present

## 2011-06-13 DIAGNOSIS — N2581 Secondary hyperparathyroidism of renal origin: Secondary | ICD-10-CM | POA: Diagnosis not present

## 2011-06-13 DIAGNOSIS — D631 Anemia in chronic kidney disease: Secondary | ICD-10-CM | POA: Diagnosis not present

## 2011-06-15 DIAGNOSIS — N186 End stage renal disease: Secondary | ICD-10-CM | POA: Diagnosis not present

## 2011-06-16 DIAGNOSIS — N186 End stage renal disease: Secondary | ICD-10-CM | POA: Diagnosis not present

## 2011-06-16 DIAGNOSIS — D509 Iron deficiency anemia, unspecified: Secondary | ICD-10-CM | POA: Diagnosis not present

## 2011-06-16 DIAGNOSIS — N2581 Secondary hyperparathyroidism of renal origin: Secondary | ICD-10-CM | POA: Diagnosis not present

## 2011-06-16 DIAGNOSIS — D631 Anemia in chronic kidney disease: Secondary | ICD-10-CM | POA: Diagnosis not present

## 2011-06-18 DIAGNOSIS — E119 Type 2 diabetes mellitus without complications: Secondary | ICD-10-CM | POA: Diagnosis not present

## 2011-06-18 DIAGNOSIS — N2581 Secondary hyperparathyroidism of renal origin: Secondary | ICD-10-CM | POA: Diagnosis not present

## 2011-06-18 DIAGNOSIS — N186 End stage renal disease: Secondary | ICD-10-CM | POA: Diagnosis not present

## 2011-06-18 DIAGNOSIS — D509 Iron deficiency anemia, unspecified: Secondary | ICD-10-CM | POA: Diagnosis not present

## 2011-06-18 DIAGNOSIS — D631 Anemia in chronic kidney disease: Secondary | ICD-10-CM | POA: Diagnosis not present

## 2011-06-20 DIAGNOSIS — N039 Chronic nephritic syndrome with unspecified morphologic changes: Secondary | ICD-10-CM | POA: Diagnosis not present

## 2011-06-20 DIAGNOSIS — N2581 Secondary hyperparathyroidism of renal origin: Secondary | ICD-10-CM | POA: Diagnosis not present

## 2011-06-20 DIAGNOSIS — D631 Anemia in chronic kidney disease: Secondary | ICD-10-CM | POA: Diagnosis not present

## 2011-06-20 DIAGNOSIS — D509 Iron deficiency anemia, unspecified: Secondary | ICD-10-CM | POA: Diagnosis not present

## 2011-06-20 DIAGNOSIS — N186 End stage renal disease: Secondary | ICD-10-CM | POA: Diagnosis not present

## 2011-06-23 DIAGNOSIS — D631 Anemia in chronic kidney disease: Secondary | ICD-10-CM | POA: Diagnosis not present

## 2011-06-23 DIAGNOSIS — N186 End stage renal disease: Secondary | ICD-10-CM | POA: Diagnosis not present

## 2011-06-23 DIAGNOSIS — N2581 Secondary hyperparathyroidism of renal origin: Secondary | ICD-10-CM | POA: Diagnosis not present

## 2011-06-23 DIAGNOSIS — D509 Iron deficiency anemia, unspecified: Secondary | ICD-10-CM | POA: Diagnosis not present

## 2011-06-25 DIAGNOSIS — N2581 Secondary hyperparathyroidism of renal origin: Secondary | ICD-10-CM | POA: Diagnosis not present

## 2011-06-25 DIAGNOSIS — N039 Chronic nephritic syndrome with unspecified morphologic changes: Secondary | ICD-10-CM | POA: Diagnosis not present

## 2011-06-25 DIAGNOSIS — N186 End stage renal disease: Secondary | ICD-10-CM | POA: Diagnosis not present

## 2011-06-25 DIAGNOSIS — D631 Anemia in chronic kidney disease: Secondary | ICD-10-CM | POA: Diagnosis not present

## 2011-06-25 DIAGNOSIS — D509 Iron deficiency anemia, unspecified: Secondary | ICD-10-CM | POA: Diagnosis not present

## 2011-06-27 DIAGNOSIS — N039 Chronic nephritic syndrome with unspecified morphologic changes: Secondary | ICD-10-CM | POA: Diagnosis not present

## 2011-06-27 DIAGNOSIS — N2581 Secondary hyperparathyroidism of renal origin: Secondary | ICD-10-CM | POA: Diagnosis not present

## 2011-06-27 DIAGNOSIS — D631 Anemia in chronic kidney disease: Secondary | ICD-10-CM | POA: Diagnosis not present

## 2011-06-27 DIAGNOSIS — D509 Iron deficiency anemia, unspecified: Secondary | ICD-10-CM | POA: Diagnosis not present

## 2011-06-27 DIAGNOSIS — N186 End stage renal disease: Secondary | ICD-10-CM | POA: Diagnosis not present

## 2011-06-30 DIAGNOSIS — D509 Iron deficiency anemia, unspecified: Secondary | ICD-10-CM | POA: Diagnosis not present

## 2011-06-30 DIAGNOSIS — D631 Anemia in chronic kidney disease: Secondary | ICD-10-CM | POA: Diagnosis not present

## 2011-06-30 DIAGNOSIS — N039 Chronic nephritic syndrome with unspecified morphologic changes: Secondary | ICD-10-CM | POA: Diagnosis not present

## 2011-06-30 DIAGNOSIS — N2581 Secondary hyperparathyroidism of renal origin: Secondary | ICD-10-CM | POA: Diagnosis not present

## 2011-06-30 DIAGNOSIS — N186 End stage renal disease: Secondary | ICD-10-CM | POA: Diagnosis not present

## 2011-07-02 DIAGNOSIS — N186 End stage renal disease: Secondary | ICD-10-CM | POA: Diagnosis not present

## 2011-07-02 DIAGNOSIS — N039 Chronic nephritic syndrome with unspecified morphologic changes: Secondary | ICD-10-CM | POA: Diagnosis not present

## 2011-07-02 DIAGNOSIS — D631 Anemia in chronic kidney disease: Secondary | ICD-10-CM | POA: Diagnosis not present

## 2011-07-02 DIAGNOSIS — D509 Iron deficiency anemia, unspecified: Secondary | ICD-10-CM | POA: Diagnosis not present

## 2011-07-02 DIAGNOSIS — N2581 Secondary hyperparathyroidism of renal origin: Secondary | ICD-10-CM | POA: Diagnosis not present

## 2011-07-04 DIAGNOSIS — N2581 Secondary hyperparathyroidism of renal origin: Secondary | ICD-10-CM | POA: Diagnosis not present

## 2011-07-04 DIAGNOSIS — N186 End stage renal disease: Secondary | ICD-10-CM | POA: Diagnosis not present

## 2011-07-04 DIAGNOSIS — D509 Iron deficiency anemia, unspecified: Secondary | ICD-10-CM | POA: Diagnosis not present

## 2011-07-04 DIAGNOSIS — D631 Anemia in chronic kidney disease: Secondary | ICD-10-CM | POA: Diagnosis not present

## 2011-07-07 DIAGNOSIS — N186 End stage renal disease: Secondary | ICD-10-CM | POA: Diagnosis not present

## 2011-07-07 DIAGNOSIS — D631 Anemia in chronic kidney disease: Secondary | ICD-10-CM | POA: Diagnosis not present

## 2011-07-07 DIAGNOSIS — D509 Iron deficiency anemia, unspecified: Secondary | ICD-10-CM | POA: Diagnosis not present

## 2011-07-07 DIAGNOSIS — N2581 Secondary hyperparathyroidism of renal origin: Secondary | ICD-10-CM | POA: Diagnosis not present

## 2011-07-07 DIAGNOSIS — N039 Chronic nephritic syndrome with unspecified morphologic changes: Secondary | ICD-10-CM | POA: Diagnosis not present

## 2011-07-09 DIAGNOSIS — D631 Anemia in chronic kidney disease: Secondary | ICD-10-CM | POA: Diagnosis not present

## 2011-07-09 DIAGNOSIS — N2581 Secondary hyperparathyroidism of renal origin: Secondary | ICD-10-CM | POA: Diagnosis not present

## 2011-07-09 DIAGNOSIS — N186 End stage renal disease: Secondary | ICD-10-CM | POA: Diagnosis not present

## 2011-07-09 DIAGNOSIS — D509 Iron deficiency anemia, unspecified: Secondary | ICD-10-CM | POA: Diagnosis not present

## 2011-07-11 DIAGNOSIS — D509 Iron deficiency anemia, unspecified: Secondary | ICD-10-CM | POA: Diagnosis not present

## 2011-07-11 DIAGNOSIS — N186 End stage renal disease: Secondary | ICD-10-CM | POA: Diagnosis not present

## 2011-07-11 DIAGNOSIS — D631 Anemia in chronic kidney disease: Secondary | ICD-10-CM | POA: Diagnosis not present

## 2011-07-11 DIAGNOSIS — N039 Chronic nephritic syndrome with unspecified morphologic changes: Secondary | ICD-10-CM | POA: Diagnosis not present

## 2011-07-11 DIAGNOSIS — N2581 Secondary hyperparathyroidism of renal origin: Secondary | ICD-10-CM | POA: Diagnosis not present

## 2011-07-14 DIAGNOSIS — N2581 Secondary hyperparathyroidism of renal origin: Secondary | ICD-10-CM | POA: Diagnosis not present

## 2011-07-14 DIAGNOSIS — N186 End stage renal disease: Secondary | ICD-10-CM | POA: Diagnosis not present

## 2011-07-14 DIAGNOSIS — D509 Iron deficiency anemia, unspecified: Secondary | ICD-10-CM | POA: Diagnosis not present

## 2011-07-14 DIAGNOSIS — D631 Anemia in chronic kidney disease: Secondary | ICD-10-CM | POA: Diagnosis not present

## 2011-07-15 DIAGNOSIS — N186 End stage renal disease: Secondary | ICD-10-CM | POA: Diagnosis not present

## 2011-07-16 DIAGNOSIS — D509 Iron deficiency anemia, unspecified: Secondary | ICD-10-CM | POA: Diagnosis not present

## 2011-07-16 DIAGNOSIS — D631 Anemia in chronic kidney disease: Secondary | ICD-10-CM | POA: Diagnosis not present

## 2011-07-16 DIAGNOSIS — N039 Chronic nephritic syndrome with unspecified morphologic changes: Secondary | ICD-10-CM | POA: Diagnosis not present

## 2011-07-16 DIAGNOSIS — N2581 Secondary hyperparathyroidism of renal origin: Secondary | ICD-10-CM | POA: Diagnosis not present

## 2011-07-16 DIAGNOSIS — N186 End stage renal disease: Secondary | ICD-10-CM | POA: Diagnosis not present

## 2011-07-17 DIAGNOSIS — E785 Hyperlipidemia, unspecified: Secondary | ICD-10-CM | POA: Diagnosis not present

## 2011-07-17 DIAGNOSIS — J441 Chronic obstructive pulmonary disease with (acute) exacerbation: Secondary | ICD-10-CM | POA: Diagnosis not present

## 2011-07-17 DIAGNOSIS — I10 Essential (primary) hypertension: Secondary | ICD-10-CM | POA: Diagnosis not present

## 2011-07-17 DIAGNOSIS — R569 Unspecified convulsions: Secondary | ICD-10-CM | POA: Diagnosis not present

## 2011-07-18 DIAGNOSIS — N186 End stage renal disease: Secondary | ICD-10-CM | POA: Diagnosis not present

## 2011-07-18 DIAGNOSIS — D509 Iron deficiency anemia, unspecified: Secondary | ICD-10-CM | POA: Diagnosis not present

## 2011-07-18 DIAGNOSIS — D631 Anemia in chronic kidney disease: Secondary | ICD-10-CM | POA: Diagnosis not present

## 2011-07-18 DIAGNOSIS — N2581 Secondary hyperparathyroidism of renal origin: Secondary | ICD-10-CM | POA: Diagnosis not present

## 2011-07-21 DIAGNOSIS — N186 End stage renal disease: Secondary | ICD-10-CM | POA: Diagnosis not present

## 2011-07-21 DIAGNOSIS — N2581 Secondary hyperparathyroidism of renal origin: Secondary | ICD-10-CM | POA: Diagnosis not present

## 2011-07-21 DIAGNOSIS — D509 Iron deficiency anemia, unspecified: Secondary | ICD-10-CM | POA: Diagnosis not present

## 2011-07-21 DIAGNOSIS — D631 Anemia in chronic kidney disease: Secondary | ICD-10-CM | POA: Diagnosis not present

## 2011-07-23 DIAGNOSIS — D509 Iron deficiency anemia, unspecified: Secondary | ICD-10-CM | POA: Diagnosis not present

## 2011-07-23 DIAGNOSIS — N186 End stage renal disease: Secondary | ICD-10-CM | POA: Diagnosis not present

## 2011-07-23 DIAGNOSIS — D631 Anemia in chronic kidney disease: Secondary | ICD-10-CM | POA: Diagnosis not present

## 2011-07-23 DIAGNOSIS — N2581 Secondary hyperparathyroidism of renal origin: Secondary | ICD-10-CM | POA: Diagnosis not present

## 2011-07-23 DIAGNOSIS — E119 Type 2 diabetes mellitus without complications: Secondary | ICD-10-CM | POA: Diagnosis not present

## 2011-07-25 DIAGNOSIS — N2581 Secondary hyperparathyroidism of renal origin: Secondary | ICD-10-CM | POA: Diagnosis not present

## 2011-07-25 DIAGNOSIS — N186 End stage renal disease: Secondary | ICD-10-CM | POA: Diagnosis not present

## 2011-07-25 DIAGNOSIS — D631 Anemia in chronic kidney disease: Secondary | ICD-10-CM | POA: Diagnosis not present

## 2011-07-25 DIAGNOSIS — D509 Iron deficiency anemia, unspecified: Secondary | ICD-10-CM | POA: Diagnosis not present

## 2011-07-28 DIAGNOSIS — N2581 Secondary hyperparathyroidism of renal origin: Secondary | ICD-10-CM | POA: Diagnosis not present

## 2011-07-28 DIAGNOSIS — N186 End stage renal disease: Secondary | ICD-10-CM | POA: Diagnosis not present

## 2011-07-28 DIAGNOSIS — N039 Chronic nephritic syndrome with unspecified morphologic changes: Secondary | ICD-10-CM | POA: Diagnosis not present

## 2011-07-28 DIAGNOSIS — D631 Anemia in chronic kidney disease: Secondary | ICD-10-CM | POA: Diagnosis not present

## 2011-07-28 DIAGNOSIS — D509 Iron deficiency anemia, unspecified: Secondary | ICD-10-CM | POA: Diagnosis not present

## 2011-07-30 DIAGNOSIS — N186 End stage renal disease: Secondary | ICD-10-CM | POA: Diagnosis not present

## 2011-07-30 DIAGNOSIS — N039 Chronic nephritic syndrome with unspecified morphologic changes: Secondary | ICD-10-CM | POA: Diagnosis not present

## 2011-07-30 DIAGNOSIS — D509 Iron deficiency anemia, unspecified: Secondary | ICD-10-CM | POA: Diagnosis not present

## 2011-07-30 DIAGNOSIS — D631 Anemia in chronic kidney disease: Secondary | ICD-10-CM | POA: Diagnosis not present

## 2011-07-30 DIAGNOSIS — N2581 Secondary hyperparathyroidism of renal origin: Secondary | ICD-10-CM | POA: Diagnosis not present

## 2011-08-01 DIAGNOSIS — N186 End stage renal disease: Secondary | ICD-10-CM | POA: Diagnosis not present

## 2011-08-01 DIAGNOSIS — D631 Anemia in chronic kidney disease: Secondary | ICD-10-CM | POA: Diagnosis not present

## 2011-08-01 DIAGNOSIS — N2581 Secondary hyperparathyroidism of renal origin: Secondary | ICD-10-CM | POA: Diagnosis not present

## 2011-08-01 DIAGNOSIS — D509 Iron deficiency anemia, unspecified: Secondary | ICD-10-CM | POA: Diagnosis not present

## 2011-08-04 DIAGNOSIS — D509 Iron deficiency anemia, unspecified: Secondary | ICD-10-CM | POA: Diagnosis not present

## 2011-08-04 DIAGNOSIS — N2581 Secondary hyperparathyroidism of renal origin: Secondary | ICD-10-CM | POA: Diagnosis not present

## 2011-08-04 DIAGNOSIS — D631 Anemia in chronic kidney disease: Secondary | ICD-10-CM | POA: Diagnosis not present

## 2011-08-04 DIAGNOSIS — N039 Chronic nephritic syndrome with unspecified morphologic changes: Secondary | ICD-10-CM | POA: Diagnosis not present

## 2011-08-04 DIAGNOSIS — N186 End stage renal disease: Secondary | ICD-10-CM | POA: Diagnosis not present

## 2011-08-06 DIAGNOSIS — D631 Anemia in chronic kidney disease: Secondary | ICD-10-CM | POA: Diagnosis not present

## 2011-08-06 DIAGNOSIS — N2581 Secondary hyperparathyroidism of renal origin: Secondary | ICD-10-CM | POA: Diagnosis not present

## 2011-08-06 DIAGNOSIS — N186 End stage renal disease: Secondary | ICD-10-CM | POA: Diagnosis not present

## 2011-08-06 DIAGNOSIS — D509 Iron deficiency anemia, unspecified: Secondary | ICD-10-CM | POA: Diagnosis not present

## 2011-08-06 DIAGNOSIS — N039 Chronic nephritic syndrome with unspecified morphologic changes: Secondary | ICD-10-CM | POA: Diagnosis not present

## 2011-08-08 DIAGNOSIS — D631 Anemia in chronic kidney disease: Secondary | ICD-10-CM | POA: Diagnosis not present

## 2011-08-08 DIAGNOSIS — N2581 Secondary hyperparathyroidism of renal origin: Secondary | ICD-10-CM | POA: Diagnosis not present

## 2011-08-08 DIAGNOSIS — D509 Iron deficiency anemia, unspecified: Secondary | ICD-10-CM | POA: Diagnosis not present

## 2011-08-08 DIAGNOSIS — N186 End stage renal disease: Secondary | ICD-10-CM | POA: Diagnosis not present

## 2011-08-11 DIAGNOSIS — D509 Iron deficiency anemia, unspecified: Secondary | ICD-10-CM | POA: Diagnosis not present

## 2011-08-11 DIAGNOSIS — N186 End stage renal disease: Secondary | ICD-10-CM | POA: Diagnosis not present

## 2011-08-11 DIAGNOSIS — D631 Anemia in chronic kidney disease: Secondary | ICD-10-CM | POA: Diagnosis not present

## 2011-08-11 DIAGNOSIS — N2581 Secondary hyperparathyroidism of renal origin: Secondary | ICD-10-CM | POA: Diagnosis not present

## 2011-08-13 DIAGNOSIS — N2581 Secondary hyperparathyroidism of renal origin: Secondary | ICD-10-CM | POA: Diagnosis not present

## 2011-08-13 DIAGNOSIS — D631 Anemia in chronic kidney disease: Secondary | ICD-10-CM | POA: Diagnosis not present

## 2011-08-13 DIAGNOSIS — N039 Chronic nephritic syndrome with unspecified morphologic changes: Secondary | ICD-10-CM | POA: Diagnosis not present

## 2011-08-13 DIAGNOSIS — D509 Iron deficiency anemia, unspecified: Secondary | ICD-10-CM | POA: Diagnosis not present

## 2011-08-13 DIAGNOSIS — N186 End stage renal disease: Secondary | ICD-10-CM | POA: Diagnosis not present

## 2011-08-15 DIAGNOSIS — N186 End stage renal disease: Secondary | ICD-10-CM | POA: Diagnosis not present

## 2011-08-15 DIAGNOSIS — D509 Iron deficiency anemia, unspecified: Secondary | ICD-10-CM | POA: Diagnosis not present

## 2011-08-15 DIAGNOSIS — N2581 Secondary hyperparathyroidism of renal origin: Secondary | ICD-10-CM | POA: Diagnosis not present

## 2011-08-15 DIAGNOSIS — D631 Anemia in chronic kidney disease: Secondary | ICD-10-CM | POA: Diagnosis not present

## 2011-08-18 DIAGNOSIS — N039 Chronic nephritic syndrome with unspecified morphologic changes: Secondary | ICD-10-CM | POA: Diagnosis not present

## 2011-08-18 DIAGNOSIS — D631 Anemia in chronic kidney disease: Secondary | ICD-10-CM | POA: Diagnosis not present

## 2011-08-18 DIAGNOSIS — N186 End stage renal disease: Secondary | ICD-10-CM | POA: Diagnosis not present

## 2011-08-18 DIAGNOSIS — N2581 Secondary hyperparathyroidism of renal origin: Secondary | ICD-10-CM | POA: Diagnosis not present

## 2011-08-18 DIAGNOSIS — D509 Iron deficiency anemia, unspecified: Secondary | ICD-10-CM | POA: Diagnosis not present

## 2011-08-20 DIAGNOSIS — N039 Chronic nephritic syndrome with unspecified morphologic changes: Secondary | ICD-10-CM | POA: Diagnosis not present

## 2011-08-20 DIAGNOSIS — D509 Iron deficiency anemia, unspecified: Secondary | ICD-10-CM | POA: Diagnosis not present

## 2011-08-20 DIAGNOSIS — E119 Type 2 diabetes mellitus without complications: Secondary | ICD-10-CM | POA: Diagnosis not present

## 2011-08-20 DIAGNOSIS — N186 End stage renal disease: Secondary | ICD-10-CM | POA: Diagnosis not present

## 2011-08-20 DIAGNOSIS — N2581 Secondary hyperparathyroidism of renal origin: Secondary | ICD-10-CM | POA: Diagnosis not present

## 2011-08-20 DIAGNOSIS — D631 Anemia in chronic kidney disease: Secondary | ICD-10-CM | POA: Diagnosis not present

## 2011-08-22 DIAGNOSIS — D631 Anemia in chronic kidney disease: Secondary | ICD-10-CM | POA: Diagnosis not present

## 2011-08-22 DIAGNOSIS — D509 Iron deficiency anemia, unspecified: Secondary | ICD-10-CM | POA: Diagnosis not present

## 2011-08-22 DIAGNOSIS — N2581 Secondary hyperparathyroidism of renal origin: Secondary | ICD-10-CM | POA: Diagnosis not present

## 2011-08-22 DIAGNOSIS — N186 End stage renal disease: Secondary | ICD-10-CM | POA: Diagnosis not present

## 2011-08-25 DIAGNOSIS — D631 Anemia in chronic kidney disease: Secondary | ICD-10-CM | POA: Diagnosis not present

## 2011-08-25 DIAGNOSIS — N186 End stage renal disease: Secondary | ICD-10-CM | POA: Diagnosis not present

## 2011-08-25 DIAGNOSIS — D509 Iron deficiency anemia, unspecified: Secondary | ICD-10-CM | POA: Diagnosis not present

## 2011-08-25 DIAGNOSIS — N2581 Secondary hyperparathyroidism of renal origin: Secondary | ICD-10-CM | POA: Diagnosis not present

## 2011-08-25 DIAGNOSIS — N039 Chronic nephritic syndrome with unspecified morphologic changes: Secondary | ICD-10-CM | POA: Diagnosis not present

## 2011-08-27 DIAGNOSIS — D509 Iron deficiency anemia, unspecified: Secondary | ICD-10-CM | POA: Diagnosis not present

## 2011-08-27 DIAGNOSIS — N186 End stage renal disease: Secondary | ICD-10-CM | POA: Diagnosis not present

## 2011-08-27 DIAGNOSIS — N2581 Secondary hyperparathyroidism of renal origin: Secondary | ICD-10-CM | POA: Diagnosis not present

## 2011-08-27 DIAGNOSIS — D631 Anemia in chronic kidney disease: Secondary | ICD-10-CM | POA: Diagnosis not present

## 2011-08-29 DIAGNOSIS — N186 End stage renal disease: Secondary | ICD-10-CM | POA: Diagnosis not present

## 2011-08-29 DIAGNOSIS — D509 Iron deficiency anemia, unspecified: Secondary | ICD-10-CM | POA: Diagnosis not present

## 2011-08-29 DIAGNOSIS — N039 Chronic nephritic syndrome with unspecified morphologic changes: Secondary | ICD-10-CM | POA: Diagnosis not present

## 2011-08-29 DIAGNOSIS — D631 Anemia in chronic kidney disease: Secondary | ICD-10-CM | POA: Diagnosis not present

## 2011-08-29 DIAGNOSIS — N2581 Secondary hyperparathyroidism of renal origin: Secondary | ICD-10-CM | POA: Diagnosis not present

## 2011-09-01 DIAGNOSIS — D631 Anemia in chronic kidney disease: Secondary | ICD-10-CM | POA: Diagnosis not present

## 2011-09-01 DIAGNOSIS — N039 Chronic nephritic syndrome with unspecified morphologic changes: Secondary | ICD-10-CM | POA: Diagnosis not present

## 2011-09-01 DIAGNOSIS — N2581 Secondary hyperparathyroidism of renal origin: Secondary | ICD-10-CM | POA: Diagnosis not present

## 2011-09-01 DIAGNOSIS — N186 End stage renal disease: Secondary | ICD-10-CM | POA: Diagnosis not present

## 2011-09-01 DIAGNOSIS — D509 Iron deficiency anemia, unspecified: Secondary | ICD-10-CM | POA: Diagnosis not present

## 2011-09-03 DIAGNOSIS — N2581 Secondary hyperparathyroidism of renal origin: Secondary | ICD-10-CM | POA: Diagnosis not present

## 2011-09-03 DIAGNOSIS — D631 Anemia in chronic kidney disease: Secondary | ICD-10-CM | POA: Diagnosis not present

## 2011-09-03 DIAGNOSIS — N186 End stage renal disease: Secondary | ICD-10-CM | POA: Diagnosis not present

## 2011-09-03 DIAGNOSIS — D509 Iron deficiency anemia, unspecified: Secondary | ICD-10-CM | POA: Diagnosis not present

## 2011-09-03 DIAGNOSIS — N039 Chronic nephritic syndrome with unspecified morphologic changes: Secondary | ICD-10-CM | POA: Diagnosis not present

## 2011-09-05 DIAGNOSIS — N2581 Secondary hyperparathyroidism of renal origin: Secondary | ICD-10-CM | POA: Diagnosis not present

## 2011-09-05 DIAGNOSIS — D631 Anemia in chronic kidney disease: Secondary | ICD-10-CM | POA: Diagnosis not present

## 2011-09-05 DIAGNOSIS — N186 End stage renal disease: Secondary | ICD-10-CM | POA: Diagnosis not present

## 2011-09-05 DIAGNOSIS — D509 Iron deficiency anemia, unspecified: Secondary | ICD-10-CM | POA: Diagnosis not present

## 2011-09-08 DIAGNOSIS — D631 Anemia in chronic kidney disease: Secondary | ICD-10-CM | POA: Diagnosis not present

## 2011-09-08 DIAGNOSIS — N2581 Secondary hyperparathyroidism of renal origin: Secondary | ICD-10-CM | POA: Diagnosis not present

## 2011-09-08 DIAGNOSIS — D509 Iron deficiency anemia, unspecified: Secondary | ICD-10-CM | POA: Diagnosis not present

## 2011-09-08 DIAGNOSIS — N186 End stage renal disease: Secondary | ICD-10-CM | POA: Diagnosis not present

## 2011-09-10 DIAGNOSIS — N2581 Secondary hyperparathyroidism of renal origin: Secondary | ICD-10-CM | POA: Diagnosis not present

## 2011-09-10 DIAGNOSIS — N039 Chronic nephritic syndrome with unspecified morphologic changes: Secondary | ICD-10-CM | POA: Diagnosis not present

## 2011-09-10 DIAGNOSIS — N186 End stage renal disease: Secondary | ICD-10-CM | POA: Diagnosis not present

## 2011-09-10 DIAGNOSIS — D631 Anemia in chronic kidney disease: Secondary | ICD-10-CM | POA: Diagnosis not present

## 2011-09-10 DIAGNOSIS — D509 Iron deficiency anemia, unspecified: Secondary | ICD-10-CM | POA: Diagnosis not present

## 2011-09-11 DIAGNOSIS — E1159 Type 2 diabetes mellitus with other circulatory complications: Secondary | ICD-10-CM | POA: Diagnosis not present

## 2011-09-11 DIAGNOSIS — E119 Type 2 diabetes mellitus without complications: Secondary | ICD-10-CM | POA: Diagnosis not present

## 2011-09-12 DIAGNOSIS — N186 End stage renal disease: Secondary | ICD-10-CM | POA: Diagnosis not present

## 2011-09-12 DIAGNOSIS — N2581 Secondary hyperparathyroidism of renal origin: Secondary | ICD-10-CM | POA: Diagnosis not present

## 2011-09-12 DIAGNOSIS — D509 Iron deficiency anemia, unspecified: Secondary | ICD-10-CM | POA: Diagnosis not present

## 2011-09-12 DIAGNOSIS — D631 Anemia in chronic kidney disease: Secondary | ICD-10-CM | POA: Diagnosis not present

## 2011-09-14 DIAGNOSIS — N186 End stage renal disease: Secondary | ICD-10-CM | POA: Diagnosis not present

## 2011-09-15 DIAGNOSIS — N2581 Secondary hyperparathyroidism of renal origin: Secondary | ICD-10-CM | POA: Diagnosis not present

## 2011-09-15 DIAGNOSIS — N186 End stage renal disease: Secondary | ICD-10-CM | POA: Diagnosis not present

## 2011-09-15 DIAGNOSIS — D509 Iron deficiency anemia, unspecified: Secondary | ICD-10-CM | POA: Diagnosis not present

## 2011-09-17 DIAGNOSIS — D509 Iron deficiency anemia, unspecified: Secondary | ICD-10-CM | POA: Diagnosis not present

## 2011-09-17 DIAGNOSIS — N2581 Secondary hyperparathyroidism of renal origin: Secondary | ICD-10-CM | POA: Diagnosis not present

## 2011-09-17 DIAGNOSIS — N186 End stage renal disease: Secondary | ICD-10-CM | POA: Diagnosis not present

## 2011-09-19 DIAGNOSIS — N186 End stage renal disease: Secondary | ICD-10-CM | POA: Diagnosis not present

## 2011-09-19 DIAGNOSIS — N2581 Secondary hyperparathyroidism of renal origin: Secondary | ICD-10-CM | POA: Diagnosis not present

## 2011-09-19 DIAGNOSIS — E119 Type 2 diabetes mellitus without complications: Secondary | ICD-10-CM | POA: Diagnosis not present

## 2011-09-19 DIAGNOSIS — D509 Iron deficiency anemia, unspecified: Secondary | ICD-10-CM | POA: Diagnosis not present

## 2011-09-22 DIAGNOSIS — N186 End stage renal disease: Secondary | ICD-10-CM | POA: Diagnosis not present

## 2011-09-22 DIAGNOSIS — D509 Iron deficiency anemia, unspecified: Secondary | ICD-10-CM | POA: Diagnosis not present

## 2011-09-22 DIAGNOSIS — N2581 Secondary hyperparathyroidism of renal origin: Secondary | ICD-10-CM | POA: Diagnosis not present

## 2011-09-24 DIAGNOSIS — D509 Iron deficiency anemia, unspecified: Secondary | ICD-10-CM | POA: Diagnosis not present

## 2011-09-24 DIAGNOSIS — N2581 Secondary hyperparathyroidism of renal origin: Secondary | ICD-10-CM | POA: Diagnosis not present

## 2011-09-24 DIAGNOSIS — N186 End stage renal disease: Secondary | ICD-10-CM | POA: Diagnosis not present

## 2011-09-26 DIAGNOSIS — N186 End stage renal disease: Secondary | ICD-10-CM | POA: Diagnosis not present

## 2011-09-26 DIAGNOSIS — D509 Iron deficiency anemia, unspecified: Secondary | ICD-10-CM | POA: Diagnosis not present

## 2011-09-26 DIAGNOSIS — N2581 Secondary hyperparathyroidism of renal origin: Secondary | ICD-10-CM | POA: Diagnosis not present

## 2011-09-29 DIAGNOSIS — D509 Iron deficiency anemia, unspecified: Secondary | ICD-10-CM | POA: Diagnosis not present

## 2011-09-29 DIAGNOSIS — N186 End stage renal disease: Secondary | ICD-10-CM | POA: Diagnosis not present

## 2011-09-29 DIAGNOSIS — N2581 Secondary hyperparathyroidism of renal origin: Secondary | ICD-10-CM | POA: Diagnosis not present

## 2011-10-01 DIAGNOSIS — N2581 Secondary hyperparathyroidism of renal origin: Secondary | ICD-10-CM | POA: Diagnosis not present

## 2011-10-01 DIAGNOSIS — D509 Iron deficiency anemia, unspecified: Secondary | ICD-10-CM | POA: Diagnosis not present

## 2011-10-01 DIAGNOSIS — N186 End stage renal disease: Secondary | ICD-10-CM | POA: Diagnosis not present

## 2011-10-03 DIAGNOSIS — D509 Iron deficiency anemia, unspecified: Secondary | ICD-10-CM | POA: Diagnosis not present

## 2011-10-03 DIAGNOSIS — N186 End stage renal disease: Secondary | ICD-10-CM | POA: Diagnosis not present

## 2011-10-03 DIAGNOSIS — N2581 Secondary hyperparathyroidism of renal origin: Secondary | ICD-10-CM | POA: Diagnosis not present

## 2011-10-06 DIAGNOSIS — N2581 Secondary hyperparathyroidism of renal origin: Secondary | ICD-10-CM | POA: Diagnosis not present

## 2011-10-06 DIAGNOSIS — D509 Iron deficiency anemia, unspecified: Secondary | ICD-10-CM | POA: Diagnosis not present

## 2011-10-06 DIAGNOSIS — N186 End stage renal disease: Secondary | ICD-10-CM | POA: Diagnosis not present

## 2011-10-08 DIAGNOSIS — N2581 Secondary hyperparathyroidism of renal origin: Secondary | ICD-10-CM | POA: Diagnosis not present

## 2011-10-08 DIAGNOSIS — N186 End stage renal disease: Secondary | ICD-10-CM | POA: Diagnosis not present

## 2011-10-08 DIAGNOSIS — D509 Iron deficiency anemia, unspecified: Secondary | ICD-10-CM | POA: Diagnosis not present

## 2011-10-10 DIAGNOSIS — D509 Iron deficiency anemia, unspecified: Secondary | ICD-10-CM | POA: Diagnosis not present

## 2011-10-10 DIAGNOSIS — N186 End stage renal disease: Secondary | ICD-10-CM | POA: Diagnosis not present

## 2011-10-10 DIAGNOSIS — N2581 Secondary hyperparathyroidism of renal origin: Secondary | ICD-10-CM | POA: Diagnosis not present

## 2011-10-13 DIAGNOSIS — N2581 Secondary hyperparathyroidism of renal origin: Secondary | ICD-10-CM | POA: Diagnosis not present

## 2011-10-13 DIAGNOSIS — N186 End stage renal disease: Secondary | ICD-10-CM | POA: Diagnosis not present

## 2011-10-13 DIAGNOSIS — D509 Iron deficiency anemia, unspecified: Secondary | ICD-10-CM | POA: Diagnosis not present

## 2011-10-15 DIAGNOSIS — N186 End stage renal disease: Secondary | ICD-10-CM | POA: Diagnosis not present

## 2011-10-15 DIAGNOSIS — N2581 Secondary hyperparathyroidism of renal origin: Secondary | ICD-10-CM | POA: Diagnosis not present

## 2011-10-15 DIAGNOSIS — D509 Iron deficiency anemia, unspecified: Secondary | ICD-10-CM | POA: Diagnosis not present

## 2011-10-17 DIAGNOSIS — N039 Chronic nephritic syndrome with unspecified morphologic changes: Secondary | ICD-10-CM | POA: Diagnosis not present

## 2011-10-17 DIAGNOSIS — N186 End stage renal disease: Secondary | ICD-10-CM | POA: Diagnosis not present

## 2011-10-17 DIAGNOSIS — D631 Anemia in chronic kidney disease: Secondary | ICD-10-CM | POA: Diagnosis not present

## 2011-10-17 DIAGNOSIS — D509 Iron deficiency anemia, unspecified: Secondary | ICD-10-CM | POA: Diagnosis not present

## 2011-10-17 DIAGNOSIS — N2581 Secondary hyperparathyroidism of renal origin: Secondary | ICD-10-CM | POA: Diagnosis not present

## 2011-10-20 DIAGNOSIS — N186 End stage renal disease: Secondary | ICD-10-CM | POA: Diagnosis not present

## 2011-10-20 DIAGNOSIS — D631 Anemia in chronic kidney disease: Secondary | ICD-10-CM | POA: Diagnosis not present

## 2011-10-20 DIAGNOSIS — N2581 Secondary hyperparathyroidism of renal origin: Secondary | ICD-10-CM | POA: Diagnosis not present

## 2011-10-20 DIAGNOSIS — D509 Iron deficiency anemia, unspecified: Secondary | ICD-10-CM | POA: Diagnosis not present

## 2011-10-20 DIAGNOSIS — N039 Chronic nephritic syndrome with unspecified morphologic changes: Secondary | ICD-10-CM | POA: Diagnosis not present

## 2011-10-22 DIAGNOSIS — D509 Iron deficiency anemia, unspecified: Secondary | ICD-10-CM | POA: Diagnosis not present

## 2011-10-22 DIAGNOSIS — D631 Anemia in chronic kidney disease: Secondary | ICD-10-CM | POA: Diagnosis not present

## 2011-10-22 DIAGNOSIS — E119 Type 2 diabetes mellitus without complications: Secondary | ICD-10-CM | POA: Diagnosis not present

## 2011-10-22 DIAGNOSIS — N2581 Secondary hyperparathyroidism of renal origin: Secondary | ICD-10-CM | POA: Diagnosis not present

## 2011-10-22 DIAGNOSIS — N039 Chronic nephritic syndrome with unspecified morphologic changes: Secondary | ICD-10-CM | POA: Diagnosis not present

## 2011-10-22 DIAGNOSIS — N186 End stage renal disease: Secondary | ICD-10-CM | POA: Diagnosis not present

## 2011-10-24 DIAGNOSIS — D631 Anemia in chronic kidney disease: Secondary | ICD-10-CM | POA: Diagnosis not present

## 2011-10-24 DIAGNOSIS — N039 Chronic nephritic syndrome with unspecified morphologic changes: Secondary | ICD-10-CM | POA: Diagnosis not present

## 2011-10-24 DIAGNOSIS — N2581 Secondary hyperparathyroidism of renal origin: Secondary | ICD-10-CM | POA: Diagnosis not present

## 2011-10-24 DIAGNOSIS — D509 Iron deficiency anemia, unspecified: Secondary | ICD-10-CM | POA: Diagnosis not present

## 2011-10-24 DIAGNOSIS — N186 End stage renal disease: Secondary | ICD-10-CM | POA: Diagnosis not present

## 2011-10-27 DIAGNOSIS — N2581 Secondary hyperparathyroidism of renal origin: Secondary | ICD-10-CM | POA: Diagnosis not present

## 2011-10-27 DIAGNOSIS — D631 Anemia in chronic kidney disease: Secondary | ICD-10-CM | POA: Diagnosis not present

## 2011-10-27 DIAGNOSIS — D509 Iron deficiency anemia, unspecified: Secondary | ICD-10-CM | POA: Diagnosis not present

## 2011-10-27 DIAGNOSIS — N186 End stage renal disease: Secondary | ICD-10-CM | POA: Diagnosis not present

## 2011-10-27 DIAGNOSIS — N039 Chronic nephritic syndrome with unspecified morphologic changes: Secondary | ICD-10-CM | POA: Diagnosis not present

## 2011-10-29 DIAGNOSIS — D509 Iron deficiency anemia, unspecified: Secondary | ICD-10-CM | POA: Diagnosis not present

## 2011-10-29 DIAGNOSIS — N2581 Secondary hyperparathyroidism of renal origin: Secondary | ICD-10-CM | POA: Diagnosis not present

## 2011-10-29 DIAGNOSIS — D631 Anemia in chronic kidney disease: Secondary | ICD-10-CM | POA: Diagnosis not present

## 2011-10-29 DIAGNOSIS — N186 End stage renal disease: Secondary | ICD-10-CM | POA: Diagnosis not present

## 2011-10-31 DIAGNOSIS — N186 End stage renal disease: Secondary | ICD-10-CM | POA: Diagnosis not present

## 2011-10-31 DIAGNOSIS — D631 Anemia in chronic kidney disease: Secondary | ICD-10-CM | POA: Diagnosis not present

## 2011-10-31 DIAGNOSIS — N039 Chronic nephritic syndrome with unspecified morphologic changes: Secondary | ICD-10-CM | POA: Diagnosis not present

## 2011-10-31 DIAGNOSIS — N2581 Secondary hyperparathyroidism of renal origin: Secondary | ICD-10-CM | POA: Diagnosis not present

## 2011-10-31 DIAGNOSIS — D509 Iron deficiency anemia, unspecified: Secondary | ICD-10-CM | POA: Diagnosis not present

## 2011-11-03 DIAGNOSIS — D509 Iron deficiency anemia, unspecified: Secondary | ICD-10-CM | POA: Diagnosis not present

## 2011-11-03 DIAGNOSIS — N186 End stage renal disease: Secondary | ICD-10-CM | POA: Diagnosis not present

## 2011-11-03 DIAGNOSIS — N2581 Secondary hyperparathyroidism of renal origin: Secondary | ICD-10-CM | POA: Diagnosis not present

## 2011-11-03 DIAGNOSIS — D631 Anemia in chronic kidney disease: Secondary | ICD-10-CM | POA: Diagnosis not present

## 2011-11-05 DIAGNOSIS — D509 Iron deficiency anemia, unspecified: Secondary | ICD-10-CM | POA: Diagnosis not present

## 2011-11-05 DIAGNOSIS — N186 End stage renal disease: Secondary | ICD-10-CM | POA: Diagnosis not present

## 2011-11-05 DIAGNOSIS — N039 Chronic nephritic syndrome with unspecified morphologic changes: Secondary | ICD-10-CM | POA: Diagnosis not present

## 2011-11-05 DIAGNOSIS — N2581 Secondary hyperparathyroidism of renal origin: Secondary | ICD-10-CM | POA: Diagnosis not present

## 2011-11-05 DIAGNOSIS — D631 Anemia in chronic kidney disease: Secondary | ICD-10-CM | POA: Diagnosis not present

## 2011-11-07 DIAGNOSIS — N2581 Secondary hyperparathyroidism of renal origin: Secondary | ICD-10-CM | POA: Diagnosis not present

## 2011-11-07 DIAGNOSIS — D509 Iron deficiency anemia, unspecified: Secondary | ICD-10-CM | POA: Diagnosis not present

## 2011-11-07 DIAGNOSIS — N039 Chronic nephritic syndrome with unspecified morphologic changes: Secondary | ICD-10-CM | POA: Diagnosis not present

## 2011-11-07 DIAGNOSIS — D631 Anemia in chronic kidney disease: Secondary | ICD-10-CM | POA: Diagnosis not present

## 2011-11-07 DIAGNOSIS — N186 End stage renal disease: Secondary | ICD-10-CM | POA: Diagnosis not present

## 2011-11-10 DIAGNOSIS — N186 End stage renal disease: Secondary | ICD-10-CM | POA: Diagnosis not present

## 2011-11-10 DIAGNOSIS — D631 Anemia in chronic kidney disease: Secondary | ICD-10-CM | POA: Diagnosis not present

## 2011-11-10 DIAGNOSIS — D509 Iron deficiency anemia, unspecified: Secondary | ICD-10-CM | POA: Diagnosis not present

## 2011-11-10 DIAGNOSIS — N039 Chronic nephritic syndrome with unspecified morphologic changes: Secondary | ICD-10-CM | POA: Diagnosis not present

## 2011-11-10 DIAGNOSIS — N2581 Secondary hyperparathyroidism of renal origin: Secondary | ICD-10-CM | POA: Diagnosis not present

## 2011-11-12 DIAGNOSIS — D631 Anemia in chronic kidney disease: Secondary | ICD-10-CM | POA: Diagnosis not present

## 2011-11-12 DIAGNOSIS — D509 Iron deficiency anemia, unspecified: Secondary | ICD-10-CM | POA: Diagnosis not present

## 2011-11-12 DIAGNOSIS — N2581 Secondary hyperparathyroidism of renal origin: Secondary | ICD-10-CM | POA: Diagnosis not present

## 2011-11-12 DIAGNOSIS — N186 End stage renal disease: Secondary | ICD-10-CM | POA: Diagnosis not present

## 2011-11-14 DIAGNOSIS — N039 Chronic nephritic syndrome with unspecified morphologic changes: Secondary | ICD-10-CM | POA: Diagnosis not present

## 2011-11-14 DIAGNOSIS — N2581 Secondary hyperparathyroidism of renal origin: Secondary | ICD-10-CM | POA: Diagnosis not present

## 2011-11-14 DIAGNOSIS — D631 Anemia in chronic kidney disease: Secondary | ICD-10-CM | POA: Diagnosis not present

## 2011-11-14 DIAGNOSIS — N186 End stage renal disease: Secondary | ICD-10-CM | POA: Diagnosis not present

## 2011-11-14 DIAGNOSIS — D509 Iron deficiency anemia, unspecified: Secondary | ICD-10-CM | POA: Diagnosis not present

## 2011-11-15 DIAGNOSIS — N186 End stage renal disease: Secondary | ICD-10-CM | POA: Diagnosis not present

## 2011-11-17 DIAGNOSIS — N2581 Secondary hyperparathyroidism of renal origin: Secondary | ICD-10-CM | POA: Diagnosis not present

## 2011-11-17 DIAGNOSIS — D509 Iron deficiency anemia, unspecified: Secondary | ICD-10-CM | POA: Diagnosis not present

## 2011-11-17 DIAGNOSIS — N186 End stage renal disease: Secondary | ICD-10-CM | POA: Diagnosis not present

## 2011-11-17 DIAGNOSIS — D631 Anemia in chronic kidney disease: Secondary | ICD-10-CM | POA: Diagnosis not present

## 2011-11-17 DIAGNOSIS — Z23 Encounter for immunization: Secondary | ICD-10-CM | POA: Diagnosis not present

## 2011-11-19 DIAGNOSIS — D631 Anemia in chronic kidney disease: Secondary | ICD-10-CM | POA: Diagnosis not present

## 2011-11-19 DIAGNOSIS — N186 End stage renal disease: Secondary | ICD-10-CM | POA: Diagnosis not present

## 2011-11-19 DIAGNOSIS — D509 Iron deficiency anemia, unspecified: Secondary | ICD-10-CM | POA: Diagnosis not present

## 2011-11-19 DIAGNOSIS — N2581 Secondary hyperparathyroidism of renal origin: Secondary | ICD-10-CM | POA: Diagnosis not present

## 2011-11-19 DIAGNOSIS — Z23 Encounter for immunization: Secondary | ICD-10-CM | POA: Diagnosis not present

## 2011-11-21 DIAGNOSIS — N2581 Secondary hyperparathyroidism of renal origin: Secondary | ICD-10-CM | POA: Diagnosis not present

## 2011-11-21 DIAGNOSIS — D509 Iron deficiency anemia, unspecified: Secondary | ICD-10-CM | POA: Diagnosis not present

## 2011-11-21 DIAGNOSIS — Z23 Encounter for immunization: Secondary | ICD-10-CM | POA: Diagnosis not present

## 2011-11-21 DIAGNOSIS — N039 Chronic nephritic syndrome with unspecified morphologic changes: Secondary | ICD-10-CM | POA: Diagnosis not present

## 2011-11-21 DIAGNOSIS — D631 Anemia in chronic kidney disease: Secondary | ICD-10-CM | POA: Diagnosis not present

## 2011-11-21 DIAGNOSIS — N186 End stage renal disease: Secondary | ICD-10-CM | POA: Diagnosis not present

## 2011-11-25 DIAGNOSIS — T82898A Other specified complication of vascular prosthetic devices, implants and grafts, initial encounter: Secondary | ICD-10-CM | POA: Diagnosis not present

## 2011-11-25 DIAGNOSIS — N186 End stage renal disease: Secondary | ICD-10-CM | POA: Diagnosis not present

## 2011-11-25 DIAGNOSIS — I871 Compression of vein: Secondary | ICD-10-CM | POA: Diagnosis not present

## 2011-11-26 DIAGNOSIS — D509 Iron deficiency anemia, unspecified: Secondary | ICD-10-CM | POA: Diagnosis not present

## 2011-11-26 DIAGNOSIS — Z23 Encounter for immunization: Secondary | ICD-10-CM | POA: Diagnosis not present

## 2011-11-26 DIAGNOSIS — N186 End stage renal disease: Secondary | ICD-10-CM | POA: Diagnosis not present

## 2011-11-26 DIAGNOSIS — D631 Anemia in chronic kidney disease: Secondary | ICD-10-CM | POA: Diagnosis not present

## 2011-11-26 DIAGNOSIS — N2581 Secondary hyperparathyroidism of renal origin: Secondary | ICD-10-CM | POA: Diagnosis not present

## 2011-11-28 DIAGNOSIS — N186 End stage renal disease: Secondary | ICD-10-CM | POA: Diagnosis not present

## 2011-11-28 DIAGNOSIS — N039 Chronic nephritic syndrome with unspecified morphologic changes: Secondary | ICD-10-CM | POA: Diagnosis not present

## 2011-11-28 DIAGNOSIS — D631 Anemia in chronic kidney disease: Secondary | ICD-10-CM | POA: Diagnosis not present

## 2011-11-28 DIAGNOSIS — D509 Iron deficiency anemia, unspecified: Secondary | ICD-10-CM | POA: Diagnosis not present

## 2011-11-28 DIAGNOSIS — N2581 Secondary hyperparathyroidism of renal origin: Secondary | ICD-10-CM | POA: Diagnosis not present

## 2011-11-28 DIAGNOSIS — Z23 Encounter for immunization: Secondary | ICD-10-CM | POA: Diagnosis not present

## 2011-12-01 DIAGNOSIS — Z23 Encounter for immunization: Secondary | ICD-10-CM | POA: Diagnosis not present

## 2011-12-01 DIAGNOSIS — N2581 Secondary hyperparathyroidism of renal origin: Secondary | ICD-10-CM | POA: Diagnosis not present

## 2011-12-01 DIAGNOSIS — D509 Iron deficiency anemia, unspecified: Secondary | ICD-10-CM | POA: Diagnosis not present

## 2011-12-01 DIAGNOSIS — D631 Anemia in chronic kidney disease: Secondary | ICD-10-CM | POA: Diagnosis not present

## 2011-12-01 DIAGNOSIS — N186 End stage renal disease: Secondary | ICD-10-CM | POA: Diagnosis not present

## 2011-12-04 DIAGNOSIS — Z23 Encounter for immunization: Secondary | ICD-10-CM | POA: Diagnosis not present

## 2011-12-04 DIAGNOSIS — N186 End stage renal disease: Secondary | ICD-10-CM | POA: Diagnosis not present

## 2011-12-04 DIAGNOSIS — D631 Anemia in chronic kidney disease: Secondary | ICD-10-CM | POA: Diagnosis not present

## 2011-12-04 DIAGNOSIS — N2581 Secondary hyperparathyroidism of renal origin: Secondary | ICD-10-CM | POA: Diagnosis not present

## 2011-12-04 DIAGNOSIS — D509 Iron deficiency anemia, unspecified: Secondary | ICD-10-CM | POA: Diagnosis not present

## 2011-12-04 DIAGNOSIS — I871 Compression of vein: Secondary | ICD-10-CM | POA: Diagnosis not present

## 2011-12-04 DIAGNOSIS — T82898A Other specified complication of vascular prosthetic devices, implants and grafts, initial encounter: Secondary | ICD-10-CM | POA: Diagnosis not present

## 2011-12-04 DIAGNOSIS — N039 Chronic nephritic syndrome with unspecified morphologic changes: Secondary | ICD-10-CM | POA: Diagnosis not present

## 2011-12-05 DIAGNOSIS — Z23 Encounter for immunization: Secondary | ICD-10-CM | POA: Diagnosis not present

## 2011-12-05 DIAGNOSIS — D631 Anemia in chronic kidney disease: Secondary | ICD-10-CM | POA: Diagnosis not present

## 2011-12-05 DIAGNOSIS — N2581 Secondary hyperparathyroidism of renal origin: Secondary | ICD-10-CM | POA: Diagnosis not present

## 2011-12-05 DIAGNOSIS — D509 Iron deficiency anemia, unspecified: Secondary | ICD-10-CM | POA: Diagnosis not present

## 2011-12-05 DIAGNOSIS — N039 Chronic nephritic syndrome with unspecified morphologic changes: Secondary | ICD-10-CM | POA: Diagnosis not present

## 2011-12-05 DIAGNOSIS — N186 End stage renal disease: Secondary | ICD-10-CM | POA: Diagnosis not present

## 2011-12-08 DIAGNOSIS — D631 Anemia in chronic kidney disease: Secondary | ICD-10-CM | POA: Diagnosis not present

## 2011-12-08 DIAGNOSIS — N039 Chronic nephritic syndrome with unspecified morphologic changes: Secondary | ICD-10-CM | POA: Diagnosis not present

## 2011-12-08 DIAGNOSIS — Z23 Encounter for immunization: Secondary | ICD-10-CM | POA: Diagnosis not present

## 2011-12-08 DIAGNOSIS — N2581 Secondary hyperparathyroidism of renal origin: Secondary | ICD-10-CM | POA: Diagnosis not present

## 2011-12-08 DIAGNOSIS — D509 Iron deficiency anemia, unspecified: Secondary | ICD-10-CM | POA: Diagnosis not present

## 2011-12-08 DIAGNOSIS — N186 End stage renal disease: Secondary | ICD-10-CM | POA: Diagnosis not present

## 2011-12-10 DIAGNOSIS — Z23 Encounter for immunization: Secondary | ICD-10-CM | POA: Diagnosis not present

## 2011-12-10 DIAGNOSIS — D631 Anemia in chronic kidney disease: Secondary | ICD-10-CM | POA: Diagnosis not present

## 2011-12-10 DIAGNOSIS — N039 Chronic nephritic syndrome with unspecified morphologic changes: Secondary | ICD-10-CM | POA: Diagnosis not present

## 2011-12-10 DIAGNOSIS — N186 End stage renal disease: Secondary | ICD-10-CM | POA: Diagnosis not present

## 2011-12-10 DIAGNOSIS — N2581 Secondary hyperparathyroidism of renal origin: Secondary | ICD-10-CM | POA: Diagnosis not present

## 2011-12-10 DIAGNOSIS — D509 Iron deficiency anemia, unspecified: Secondary | ICD-10-CM | POA: Diagnosis not present

## 2011-12-12 DIAGNOSIS — D509 Iron deficiency anemia, unspecified: Secondary | ICD-10-CM | POA: Diagnosis not present

## 2011-12-12 DIAGNOSIS — D631 Anemia in chronic kidney disease: Secondary | ICD-10-CM | POA: Diagnosis not present

## 2011-12-12 DIAGNOSIS — N186 End stage renal disease: Secondary | ICD-10-CM | POA: Diagnosis not present

## 2011-12-12 DIAGNOSIS — N2581 Secondary hyperparathyroidism of renal origin: Secondary | ICD-10-CM | POA: Diagnosis not present

## 2011-12-12 DIAGNOSIS — N039 Chronic nephritic syndrome with unspecified morphologic changes: Secondary | ICD-10-CM | POA: Diagnosis not present

## 2011-12-12 DIAGNOSIS — Z23 Encounter for immunization: Secondary | ICD-10-CM | POA: Diagnosis not present

## 2011-12-15 DIAGNOSIS — D509 Iron deficiency anemia, unspecified: Secondary | ICD-10-CM | POA: Diagnosis not present

## 2011-12-15 DIAGNOSIS — Z23 Encounter for immunization: Secondary | ICD-10-CM | POA: Diagnosis not present

## 2011-12-15 DIAGNOSIS — D631 Anemia in chronic kidney disease: Secondary | ICD-10-CM | POA: Diagnosis not present

## 2011-12-15 DIAGNOSIS — N2581 Secondary hyperparathyroidism of renal origin: Secondary | ICD-10-CM | POA: Diagnosis not present

## 2011-12-15 DIAGNOSIS — N186 End stage renal disease: Secondary | ICD-10-CM | POA: Diagnosis not present

## 2011-12-15 DIAGNOSIS — N039 Chronic nephritic syndrome with unspecified morphologic changes: Secondary | ICD-10-CM | POA: Diagnosis not present

## 2011-12-17 DIAGNOSIS — D631 Anemia in chronic kidney disease: Secondary | ICD-10-CM | POA: Diagnosis not present

## 2011-12-17 DIAGNOSIS — N2581 Secondary hyperparathyroidism of renal origin: Secondary | ICD-10-CM | POA: Diagnosis not present

## 2011-12-17 DIAGNOSIS — Z992 Dependence on renal dialysis: Secondary | ICD-10-CM | POA: Diagnosis not present

## 2011-12-17 DIAGNOSIS — N186 End stage renal disease: Secondary | ICD-10-CM | POA: Diagnosis not present

## 2011-12-17 DIAGNOSIS — D509 Iron deficiency anemia, unspecified: Secondary | ICD-10-CM | POA: Diagnosis not present

## 2011-12-17 DIAGNOSIS — N039 Chronic nephritic syndrome with unspecified morphologic changes: Secondary | ICD-10-CM | POA: Diagnosis not present

## 2011-12-22 DIAGNOSIS — Z992 Dependence on renal dialysis: Secondary | ICD-10-CM | POA: Diagnosis not present

## 2011-12-22 DIAGNOSIS — N2581 Secondary hyperparathyroidism of renal origin: Secondary | ICD-10-CM | POA: Diagnosis not present

## 2011-12-22 DIAGNOSIS — D509 Iron deficiency anemia, unspecified: Secondary | ICD-10-CM | POA: Diagnosis not present

## 2011-12-22 DIAGNOSIS — D631 Anemia in chronic kidney disease: Secondary | ICD-10-CM | POA: Diagnosis not present

## 2011-12-22 DIAGNOSIS — N186 End stage renal disease: Secondary | ICD-10-CM | POA: Diagnosis not present

## 2011-12-22 DIAGNOSIS — T82898A Other specified complication of vascular prosthetic devices, implants and grafts, initial encounter: Secondary | ICD-10-CM | POA: Diagnosis not present

## 2011-12-22 DIAGNOSIS — I871 Compression of vein: Secondary | ICD-10-CM | POA: Diagnosis not present

## 2011-12-24 DIAGNOSIS — D631 Anemia in chronic kidney disease: Secondary | ICD-10-CM | POA: Diagnosis not present

## 2011-12-24 DIAGNOSIS — N186 End stage renal disease: Secondary | ICD-10-CM | POA: Diagnosis not present

## 2011-12-24 DIAGNOSIS — E119 Type 2 diabetes mellitus without complications: Secondary | ICD-10-CM | POA: Diagnosis not present

## 2011-12-24 DIAGNOSIS — D509 Iron deficiency anemia, unspecified: Secondary | ICD-10-CM | POA: Diagnosis not present

## 2011-12-24 DIAGNOSIS — Z992 Dependence on renal dialysis: Secondary | ICD-10-CM | POA: Diagnosis not present

## 2011-12-24 DIAGNOSIS — N2581 Secondary hyperparathyroidism of renal origin: Secondary | ICD-10-CM | POA: Diagnosis not present

## 2011-12-26 DIAGNOSIS — N186 End stage renal disease: Secondary | ICD-10-CM | POA: Diagnosis not present

## 2011-12-26 DIAGNOSIS — D509 Iron deficiency anemia, unspecified: Secondary | ICD-10-CM | POA: Diagnosis not present

## 2011-12-26 DIAGNOSIS — D631 Anemia in chronic kidney disease: Secondary | ICD-10-CM | POA: Diagnosis not present

## 2011-12-26 DIAGNOSIS — N2581 Secondary hyperparathyroidism of renal origin: Secondary | ICD-10-CM | POA: Diagnosis not present

## 2011-12-26 DIAGNOSIS — Z992 Dependence on renal dialysis: Secondary | ICD-10-CM | POA: Diagnosis not present

## 2011-12-26 DIAGNOSIS — N039 Chronic nephritic syndrome with unspecified morphologic changes: Secondary | ICD-10-CM | POA: Diagnosis not present

## 2011-12-29 DIAGNOSIS — Z992 Dependence on renal dialysis: Secondary | ICD-10-CM | POA: Diagnosis not present

## 2011-12-29 DIAGNOSIS — D509 Iron deficiency anemia, unspecified: Secondary | ICD-10-CM | POA: Diagnosis not present

## 2011-12-29 DIAGNOSIS — N2581 Secondary hyperparathyroidism of renal origin: Secondary | ICD-10-CM | POA: Diagnosis not present

## 2011-12-29 DIAGNOSIS — N186 End stage renal disease: Secondary | ICD-10-CM | POA: Diagnosis not present

## 2011-12-29 DIAGNOSIS — N039 Chronic nephritic syndrome with unspecified morphologic changes: Secondary | ICD-10-CM | POA: Diagnosis not present

## 2011-12-29 DIAGNOSIS — D631 Anemia in chronic kidney disease: Secondary | ICD-10-CM | POA: Diagnosis not present

## 2011-12-31 DIAGNOSIS — Z992 Dependence on renal dialysis: Secondary | ICD-10-CM | POA: Diagnosis not present

## 2011-12-31 DIAGNOSIS — D509 Iron deficiency anemia, unspecified: Secondary | ICD-10-CM | POA: Diagnosis not present

## 2011-12-31 DIAGNOSIS — D631 Anemia in chronic kidney disease: Secondary | ICD-10-CM | POA: Diagnosis not present

## 2011-12-31 DIAGNOSIS — N186 End stage renal disease: Secondary | ICD-10-CM | POA: Diagnosis not present

## 2011-12-31 DIAGNOSIS — N2581 Secondary hyperparathyroidism of renal origin: Secondary | ICD-10-CM | POA: Diagnosis not present

## 2012-01-02 DIAGNOSIS — D631 Anemia in chronic kidney disease: Secondary | ICD-10-CM | POA: Diagnosis not present

## 2012-01-02 DIAGNOSIS — N2581 Secondary hyperparathyroidism of renal origin: Secondary | ICD-10-CM | POA: Diagnosis not present

## 2012-01-02 DIAGNOSIS — D509 Iron deficiency anemia, unspecified: Secondary | ICD-10-CM | POA: Diagnosis not present

## 2012-01-02 DIAGNOSIS — Z992 Dependence on renal dialysis: Secondary | ICD-10-CM | POA: Diagnosis not present

## 2012-01-02 DIAGNOSIS — N186 End stage renal disease: Secondary | ICD-10-CM | POA: Diagnosis not present

## 2012-01-05 DIAGNOSIS — D631 Anemia in chronic kidney disease: Secondary | ICD-10-CM | POA: Diagnosis not present

## 2012-01-05 DIAGNOSIS — N2581 Secondary hyperparathyroidism of renal origin: Secondary | ICD-10-CM | POA: Diagnosis not present

## 2012-01-05 DIAGNOSIS — D509 Iron deficiency anemia, unspecified: Secondary | ICD-10-CM | POA: Diagnosis not present

## 2012-01-05 DIAGNOSIS — N039 Chronic nephritic syndrome with unspecified morphologic changes: Secondary | ICD-10-CM | POA: Diagnosis not present

## 2012-01-05 DIAGNOSIS — Z992 Dependence on renal dialysis: Secondary | ICD-10-CM | POA: Diagnosis not present

## 2012-01-05 DIAGNOSIS — N186 End stage renal disease: Secondary | ICD-10-CM | POA: Diagnosis not present

## 2012-01-08 DIAGNOSIS — D631 Anemia in chronic kidney disease: Secondary | ICD-10-CM | POA: Diagnosis not present

## 2012-01-08 DIAGNOSIS — T82898A Other specified complication of vascular prosthetic devices, implants and grafts, initial encounter: Secondary | ICD-10-CM | POA: Diagnosis not present

## 2012-01-08 DIAGNOSIS — I871 Compression of vein: Secondary | ICD-10-CM | POA: Diagnosis not present

## 2012-01-08 DIAGNOSIS — N2581 Secondary hyperparathyroidism of renal origin: Secondary | ICD-10-CM | POA: Diagnosis not present

## 2012-01-08 DIAGNOSIS — I771 Stricture of artery: Secondary | ICD-10-CM | POA: Diagnosis not present

## 2012-01-08 DIAGNOSIS — Z992 Dependence on renal dialysis: Secondary | ICD-10-CM | POA: Diagnosis not present

## 2012-01-08 DIAGNOSIS — N186 End stage renal disease: Secondary | ICD-10-CM | POA: Diagnosis not present

## 2012-01-08 DIAGNOSIS — D509 Iron deficiency anemia, unspecified: Secondary | ICD-10-CM | POA: Diagnosis not present

## 2012-01-09 DIAGNOSIS — N2581 Secondary hyperparathyroidism of renal origin: Secondary | ICD-10-CM | POA: Diagnosis not present

## 2012-01-09 DIAGNOSIS — Z992 Dependence on renal dialysis: Secondary | ICD-10-CM | POA: Diagnosis not present

## 2012-01-09 DIAGNOSIS — D509 Iron deficiency anemia, unspecified: Secondary | ICD-10-CM | POA: Diagnosis not present

## 2012-01-09 DIAGNOSIS — D631 Anemia in chronic kidney disease: Secondary | ICD-10-CM | POA: Diagnosis not present

## 2012-01-09 DIAGNOSIS — N186 End stage renal disease: Secondary | ICD-10-CM | POA: Diagnosis not present

## 2012-01-12 DIAGNOSIS — N039 Chronic nephritic syndrome with unspecified morphologic changes: Secondary | ICD-10-CM | POA: Diagnosis not present

## 2012-01-12 DIAGNOSIS — D631 Anemia in chronic kidney disease: Secondary | ICD-10-CM | POA: Diagnosis not present

## 2012-01-12 DIAGNOSIS — D509 Iron deficiency anemia, unspecified: Secondary | ICD-10-CM | POA: Diagnosis not present

## 2012-01-12 DIAGNOSIS — Z992 Dependence on renal dialysis: Secondary | ICD-10-CM | POA: Diagnosis not present

## 2012-01-12 DIAGNOSIS — N2581 Secondary hyperparathyroidism of renal origin: Secondary | ICD-10-CM | POA: Diagnosis not present

## 2012-01-12 DIAGNOSIS — N186 End stage renal disease: Secondary | ICD-10-CM | POA: Diagnosis not present

## 2012-01-14 DIAGNOSIS — N2581 Secondary hyperparathyroidism of renal origin: Secondary | ICD-10-CM | POA: Diagnosis not present

## 2012-01-14 DIAGNOSIS — D509 Iron deficiency anemia, unspecified: Secondary | ICD-10-CM | POA: Diagnosis not present

## 2012-01-14 DIAGNOSIS — D631 Anemia in chronic kidney disease: Secondary | ICD-10-CM | POA: Diagnosis not present

## 2012-01-14 DIAGNOSIS — N186 End stage renal disease: Secondary | ICD-10-CM | POA: Diagnosis not present

## 2012-01-14 DIAGNOSIS — Z992 Dependence on renal dialysis: Secondary | ICD-10-CM | POA: Diagnosis not present

## 2012-01-15 DIAGNOSIS — N186 End stage renal disease: Secondary | ICD-10-CM | POA: Diagnosis not present

## 2012-01-16 DIAGNOSIS — D631 Anemia in chronic kidney disease: Secondary | ICD-10-CM | POA: Diagnosis not present

## 2012-01-16 DIAGNOSIS — N039 Chronic nephritic syndrome with unspecified morphologic changes: Secondary | ICD-10-CM | POA: Diagnosis not present

## 2012-01-16 DIAGNOSIS — N186 End stage renal disease: Secondary | ICD-10-CM | POA: Diagnosis not present

## 2012-01-16 DIAGNOSIS — D509 Iron deficiency anemia, unspecified: Secondary | ICD-10-CM | POA: Diagnosis not present

## 2012-01-16 DIAGNOSIS — N2581 Secondary hyperparathyroidism of renal origin: Secondary | ICD-10-CM | POA: Diagnosis not present

## 2012-01-19 DIAGNOSIS — D631 Anemia in chronic kidney disease: Secondary | ICD-10-CM | POA: Diagnosis not present

## 2012-01-19 DIAGNOSIS — N2581 Secondary hyperparathyroidism of renal origin: Secondary | ICD-10-CM | POA: Diagnosis not present

## 2012-01-19 DIAGNOSIS — N039 Chronic nephritic syndrome with unspecified morphologic changes: Secondary | ICD-10-CM | POA: Diagnosis not present

## 2012-01-19 DIAGNOSIS — D509 Iron deficiency anemia, unspecified: Secondary | ICD-10-CM | POA: Diagnosis not present

## 2012-01-19 DIAGNOSIS — N186 End stage renal disease: Secondary | ICD-10-CM | POA: Diagnosis not present

## 2012-01-21 DIAGNOSIS — D509 Iron deficiency anemia, unspecified: Secondary | ICD-10-CM | POA: Diagnosis not present

## 2012-01-21 DIAGNOSIS — N2581 Secondary hyperparathyroidism of renal origin: Secondary | ICD-10-CM | POA: Diagnosis not present

## 2012-01-21 DIAGNOSIS — N186 End stage renal disease: Secondary | ICD-10-CM | POA: Diagnosis not present

## 2012-01-21 DIAGNOSIS — D631 Anemia in chronic kidney disease: Secondary | ICD-10-CM | POA: Diagnosis not present

## 2012-01-23 DIAGNOSIS — D509 Iron deficiency anemia, unspecified: Secondary | ICD-10-CM | POA: Diagnosis not present

## 2012-01-23 DIAGNOSIS — N039 Chronic nephritic syndrome with unspecified morphologic changes: Secondary | ICD-10-CM | POA: Diagnosis not present

## 2012-01-23 DIAGNOSIS — N186 End stage renal disease: Secondary | ICD-10-CM | POA: Diagnosis not present

## 2012-01-23 DIAGNOSIS — N2581 Secondary hyperparathyroidism of renal origin: Secondary | ICD-10-CM | POA: Diagnosis not present

## 2012-01-23 DIAGNOSIS — D631 Anemia in chronic kidney disease: Secondary | ICD-10-CM | POA: Diagnosis not present

## 2012-01-26 DIAGNOSIS — D631 Anemia in chronic kidney disease: Secondary | ICD-10-CM | POA: Diagnosis not present

## 2012-01-26 DIAGNOSIS — N2581 Secondary hyperparathyroidism of renal origin: Secondary | ICD-10-CM | POA: Diagnosis not present

## 2012-01-26 DIAGNOSIS — D509 Iron deficiency anemia, unspecified: Secondary | ICD-10-CM | POA: Diagnosis not present

## 2012-01-26 DIAGNOSIS — N186 End stage renal disease: Secondary | ICD-10-CM | POA: Diagnosis not present

## 2012-01-28 DIAGNOSIS — D509 Iron deficiency anemia, unspecified: Secondary | ICD-10-CM | POA: Diagnosis not present

## 2012-01-28 DIAGNOSIS — N186 End stage renal disease: Secondary | ICD-10-CM | POA: Diagnosis not present

## 2012-01-28 DIAGNOSIS — N2581 Secondary hyperparathyroidism of renal origin: Secondary | ICD-10-CM | POA: Diagnosis not present

## 2012-01-28 DIAGNOSIS — D631 Anemia in chronic kidney disease: Secondary | ICD-10-CM | POA: Diagnosis not present

## 2012-01-30 DIAGNOSIS — N2581 Secondary hyperparathyroidism of renal origin: Secondary | ICD-10-CM | POA: Diagnosis not present

## 2012-01-30 DIAGNOSIS — N186 End stage renal disease: Secondary | ICD-10-CM | POA: Diagnosis not present

## 2012-01-30 DIAGNOSIS — D631 Anemia in chronic kidney disease: Secondary | ICD-10-CM | POA: Diagnosis not present

## 2012-01-30 DIAGNOSIS — D509 Iron deficiency anemia, unspecified: Secondary | ICD-10-CM | POA: Diagnosis not present

## 2012-02-02 DIAGNOSIS — D509 Iron deficiency anemia, unspecified: Secondary | ICD-10-CM | POA: Diagnosis not present

## 2012-02-02 DIAGNOSIS — D631 Anemia in chronic kidney disease: Secondary | ICD-10-CM | POA: Diagnosis not present

## 2012-02-02 DIAGNOSIS — N2581 Secondary hyperparathyroidism of renal origin: Secondary | ICD-10-CM | POA: Diagnosis not present

## 2012-02-02 DIAGNOSIS — N186 End stage renal disease: Secondary | ICD-10-CM | POA: Diagnosis not present

## 2012-02-03 DIAGNOSIS — I10 Essential (primary) hypertension: Secondary | ICD-10-CM | POA: Diagnosis not present

## 2012-02-03 DIAGNOSIS — J441 Chronic obstructive pulmonary disease with (acute) exacerbation: Secondary | ICD-10-CM | POA: Diagnosis not present

## 2012-02-03 DIAGNOSIS — E785 Hyperlipidemia, unspecified: Secondary | ICD-10-CM | POA: Diagnosis not present

## 2012-02-03 DIAGNOSIS — J45901 Unspecified asthma with (acute) exacerbation: Secondary | ICD-10-CM | POA: Diagnosis not present

## 2012-02-03 DIAGNOSIS — N19 Unspecified kidney failure: Secondary | ICD-10-CM | POA: Diagnosis not present

## 2012-02-04 DIAGNOSIS — N186 End stage renal disease: Secondary | ICD-10-CM | POA: Diagnosis not present

## 2012-02-04 DIAGNOSIS — N039 Chronic nephritic syndrome with unspecified morphologic changes: Secondary | ICD-10-CM | POA: Diagnosis not present

## 2012-02-04 DIAGNOSIS — N2581 Secondary hyperparathyroidism of renal origin: Secondary | ICD-10-CM | POA: Diagnosis not present

## 2012-02-04 DIAGNOSIS — D509 Iron deficiency anemia, unspecified: Secondary | ICD-10-CM | POA: Diagnosis not present

## 2012-02-04 DIAGNOSIS — D631 Anemia in chronic kidney disease: Secondary | ICD-10-CM | POA: Diagnosis not present

## 2012-02-05 DIAGNOSIS — N186 End stage renal disease: Secondary | ICD-10-CM | POA: Diagnosis not present

## 2012-02-05 DIAGNOSIS — T82898A Other specified complication of vascular prosthetic devices, implants and grafts, initial encounter: Secondary | ICD-10-CM | POA: Diagnosis not present

## 2012-02-06 DIAGNOSIS — D631 Anemia in chronic kidney disease: Secondary | ICD-10-CM | POA: Diagnosis not present

## 2012-02-06 DIAGNOSIS — N186 End stage renal disease: Secondary | ICD-10-CM | POA: Diagnosis not present

## 2012-02-06 DIAGNOSIS — N039 Chronic nephritic syndrome with unspecified morphologic changes: Secondary | ICD-10-CM | POA: Diagnosis not present

## 2012-02-06 DIAGNOSIS — D509 Iron deficiency anemia, unspecified: Secondary | ICD-10-CM | POA: Diagnosis not present

## 2012-02-06 DIAGNOSIS — N2581 Secondary hyperparathyroidism of renal origin: Secondary | ICD-10-CM | POA: Diagnosis not present

## 2012-02-09 DIAGNOSIS — N039 Chronic nephritic syndrome with unspecified morphologic changes: Secondary | ICD-10-CM | POA: Diagnosis not present

## 2012-02-09 DIAGNOSIS — D631 Anemia in chronic kidney disease: Secondary | ICD-10-CM | POA: Diagnosis not present

## 2012-02-09 DIAGNOSIS — D509 Iron deficiency anemia, unspecified: Secondary | ICD-10-CM | POA: Diagnosis not present

## 2012-02-09 DIAGNOSIS — N186 End stage renal disease: Secondary | ICD-10-CM | POA: Diagnosis not present

## 2012-02-09 DIAGNOSIS — N2581 Secondary hyperparathyroidism of renal origin: Secondary | ICD-10-CM | POA: Diagnosis not present

## 2012-02-11 DIAGNOSIS — N186 End stage renal disease: Secondary | ICD-10-CM | POA: Diagnosis not present

## 2012-02-11 DIAGNOSIS — N039 Chronic nephritic syndrome with unspecified morphologic changes: Secondary | ICD-10-CM | POA: Diagnosis not present

## 2012-02-11 DIAGNOSIS — N2581 Secondary hyperparathyroidism of renal origin: Secondary | ICD-10-CM | POA: Diagnosis not present

## 2012-02-11 DIAGNOSIS — D631 Anemia in chronic kidney disease: Secondary | ICD-10-CM | POA: Diagnosis not present

## 2012-02-11 DIAGNOSIS — D509 Iron deficiency anemia, unspecified: Secondary | ICD-10-CM | POA: Diagnosis not present

## 2012-02-13 DIAGNOSIS — D509 Iron deficiency anemia, unspecified: Secondary | ICD-10-CM | POA: Diagnosis not present

## 2012-02-13 DIAGNOSIS — N039 Chronic nephritic syndrome with unspecified morphologic changes: Secondary | ICD-10-CM | POA: Diagnosis not present

## 2012-02-13 DIAGNOSIS — N2581 Secondary hyperparathyroidism of renal origin: Secondary | ICD-10-CM | POA: Diagnosis not present

## 2012-02-13 DIAGNOSIS — N186 End stage renal disease: Secondary | ICD-10-CM | POA: Diagnosis not present

## 2012-02-13 DIAGNOSIS — D631 Anemia in chronic kidney disease: Secondary | ICD-10-CM | POA: Diagnosis not present

## 2012-02-14 DIAGNOSIS — N186 End stage renal disease: Secondary | ICD-10-CM | POA: Diagnosis not present

## 2012-02-16 DIAGNOSIS — D631 Anemia in chronic kidney disease: Secondary | ICD-10-CM | POA: Diagnosis not present

## 2012-02-16 DIAGNOSIS — N039 Chronic nephritic syndrome with unspecified morphologic changes: Secondary | ICD-10-CM | POA: Diagnosis not present

## 2012-02-16 DIAGNOSIS — N2581 Secondary hyperparathyroidism of renal origin: Secondary | ICD-10-CM | POA: Diagnosis not present

## 2012-02-16 DIAGNOSIS — N186 End stage renal disease: Secondary | ICD-10-CM | POA: Diagnosis not present

## 2012-02-16 DIAGNOSIS — D509 Iron deficiency anemia, unspecified: Secondary | ICD-10-CM | POA: Diagnosis not present

## 2012-02-18 DIAGNOSIS — D509 Iron deficiency anemia, unspecified: Secondary | ICD-10-CM | POA: Diagnosis not present

## 2012-02-18 DIAGNOSIS — N186 End stage renal disease: Secondary | ICD-10-CM | POA: Diagnosis not present

## 2012-02-18 DIAGNOSIS — D631 Anemia in chronic kidney disease: Secondary | ICD-10-CM | POA: Diagnosis not present

## 2012-02-18 DIAGNOSIS — N2581 Secondary hyperparathyroidism of renal origin: Secondary | ICD-10-CM | POA: Diagnosis not present

## 2012-02-20 DIAGNOSIS — D631 Anemia in chronic kidney disease: Secondary | ICD-10-CM | POA: Diagnosis not present

## 2012-02-20 DIAGNOSIS — N039 Chronic nephritic syndrome with unspecified morphologic changes: Secondary | ICD-10-CM | POA: Diagnosis not present

## 2012-02-20 DIAGNOSIS — N186 End stage renal disease: Secondary | ICD-10-CM | POA: Diagnosis not present

## 2012-02-20 DIAGNOSIS — D509 Iron deficiency anemia, unspecified: Secondary | ICD-10-CM | POA: Diagnosis not present

## 2012-02-20 DIAGNOSIS — N2581 Secondary hyperparathyroidism of renal origin: Secondary | ICD-10-CM | POA: Diagnosis not present

## 2012-02-23 DIAGNOSIS — N186 End stage renal disease: Secondary | ICD-10-CM | POA: Diagnosis not present

## 2012-02-23 DIAGNOSIS — D631 Anemia in chronic kidney disease: Secondary | ICD-10-CM | POA: Diagnosis not present

## 2012-02-23 DIAGNOSIS — N2581 Secondary hyperparathyroidism of renal origin: Secondary | ICD-10-CM | POA: Diagnosis not present

## 2012-02-23 DIAGNOSIS — D509 Iron deficiency anemia, unspecified: Secondary | ICD-10-CM | POA: Diagnosis not present

## 2012-02-25 DIAGNOSIS — N186 End stage renal disease: Secondary | ICD-10-CM | POA: Diagnosis not present

## 2012-02-25 DIAGNOSIS — D631 Anemia in chronic kidney disease: Secondary | ICD-10-CM | POA: Diagnosis not present

## 2012-02-25 DIAGNOSIS — N2581 Secondary hyperparathyroidism of renal origin: Secondary | ICD-10-CM | POA: Diagnosis not present

## 2012-02-25 DIAGNOSIS — D509 Iron deficiency anemia, unspecified: Secondary | ICD-10-CM | POA: Diagnosis not present

## 2012-02-27 DIAGNOSIS — N186 End stage renal disease: Secondary | ICD-10-CM | POA: Diagnosis not present

## 2012-02-27 DIAGNOSIS — D509 Iron deficiency anemia, unspecified: Secondary | ICD-10-CM | POA: Diagnosis not present

## 2012-02-27 DIAGNOSIS — N2581 Secondary hyperparathyroidism of renal origin: Secondary | ICD-10-CM | POA: Diagnosis not present

## 2012-02-27 DIAGNOSIS — N039 Chronic nephritic syndrome with unspecified morphologic changes: Secondary | ICD-10-CM | POA: Diagnosis not present

## 2012-02-27 DIAGNOSIS — D631 Anemia in chronic kidney disease: Secondary | ICD-10-CM | POA: Diagnosis not present

## 2012-03-01 DIAGNOSIS — D509 Iron deficiency anemia, unspecified: Secondary | ICD-10-CM | POA: Diagnosis not present

## 2012-03-01 DIAGNOSIS — N2581 Secondary hyperparathyroidism of renal origin: Secondary | ICD-10-CM | POA: Diagnosis not present

## 2012-03-01 DIAGNOSIS — N186 End stage renal disease: Secondary | ICD-10-CM | POA: Diagnosis not present

## 2012-03-01 DIAGNOSIS — D631 Anemia in chronic kidney disease: Secondary | ICD-10-CM | POA: Diagnosis not present

## 2012-03-01 DIAGNOSIS — N039 Chronic nephritic syndrome with unspecified morphologic changes: Secondary | ICD-10-CM | POA: Diagnosis not present

## 2012-03-03 DIAGNOSIS — N2581 Secondary hyperparathyroidism of renal origin: Secondary | ICD-10-CM | POA: Diagnosis not present

## 2012-03-03 DIAGNOSIS — D631 Anemia in chronic kidney disease: Secondary | ICD-10-CM | POA: Diagnosis not present

## 2012-03-03 DIAGNOSIS — N186 End stage renal disease: Secondary | ICD-10-CM | POA: Diagnosis not present

## 2012-03-03 DIAGNOSIS — D509 Iron deficiency anemia, unspecified: Secondary | ICD-10-CM | POA: Diagnosis not present

## 2012-03-05 DIAGNOSIS — N186 End stage renal disease: Secondary | ICD-10-CM | POA: Diagnosis not present

## 2012-03-05 DIAGNOSIS — D631 Anemia in chronic kidney disease: Secondary | ICD-10-CM | POA: Diagnosis not present

## 2012-03-05 DIAGNOSIS — D509 Iron deficiency anemia, unspecified: Secondary | ICD-10-CM | POA: Diagnosis not present

## 2012-03-05 DIAGNOSIS — N2581 Secondary hyperparathyroidism of renal origin: Secondary | ICD-10-CM | POA: Diagnosis not present

## 2012-03-07 DIAGNOSIS — D631 Anemia in chronic kidney disease: Secondary | ICD-10-CM | POA: Diagnosis not present

## 2012-03-07 DIAGNOSIS — N186 End stage renal disease: Secondary | ICD-10-CM | POA: Diagnosis not present

## 2012-03-07 DIAGNOSIS — D509 Iron deficiency anemia, unspecified: Secondary | ICD-10-CM | POA: Diagnosis not present

## 2012-03-07 DIAGNOSIS — N2581 Secondary hyperparathyroidism of renal origin: Secondary | ICD-10-CM | POA: Diagnosis not present

## 2012-03-09 DIAGNOSIS — D509 Iron deficiency anemia, unspecified: Secondary | ICD-10-CM | POA: Diagnosis not present

## 2012-03-09 DIAGNOSIS — D631 Anemia in chronic kidney disease: Secondary | ICD-10-CM | POA: Diagnosis not present

## 2012-03-09 DIAGNOSIS — N039 Chronic nephritic syndrome with unspecified morphologic changes: Secondary | ICD-10-CM | POA: Diagnosis not present

## 2012-03-09 DIAGNOSIS — N2581 Secondary hyperparathyroidism of renal origin: Secondary | ICD-10-CM | POA: Diagnosis not present

## 2012-03-09 DIAGNOSIS — N186 End stage renal disease: Secondary | ICD-10-CM | POA: Diagnosis not present

## 2012-03-12 DIAGNOSIS — D509 Iron deficiency anemia, unspecified: Secondary | ICD-10-CM | POA: Diagnosis not present

## 2012-03-12 DIAGNOSIS — N2581 Secondary hyperparathyroidism of renal origin: Secondary | ICD-10-CM | POA: Diagnosis not present

## 2012-03-12 DIAGNOSIS — N039 Chronic nephritic syndrome with unspecified morphologic changes: Secondary | ICD-10-CM | POA: Diagnosis not present

## 2012-03-12 DIAGNOSIS — D631 Anemia in chronic kidney disease: Secondary | ICD-10-CM | POA: Diagnosis not present

## 2012-03-12 DIAGNOSIS — N186 End stage renal disease: Secondary | ICD-10-CM | POA: Diagnosis not present

## 2012-03-14 DIAGNOSIS — D631 Anemia in chronic kidney disease: Secondary | ICD-10-CM | POA: Diagnosis not present

## 2012-03-14 DIAGNOSIS — N2581 Secondary hyperparathyroidism of renal origin: Secondary | ICD-10-CM | POA: Diagnosis not present

## 2012-03-14 DIAGNOSIS — N186 End stage renal disease: Secondary | ICD-10-CM | POA: Diagnosis not present

## 2012-03-14 DIAGNOSIS — D509 Iron deficiency anemia, unspecified: Secondary | ICD-10-CM | POA: Diagnosis not present

## 2012-03-16 DIAGNOSIS — D509 Iron deficiency anemia, unspecified: Secondary | ICD-10-CM | POA: Diagnosis not present

## 2012-03-16 DIAGNOSIS — N2581 Secondary hyperparathyroidism of renal origin: Secondary | ICD-10-CM | POA: Diagnosis not present

## 2012-03-16 DIAGNOSIS — N186 End stage renal disease: Secondary | ICD-10-CM | POA: Diagnosis not present

## 2012-03-16 DIAGNOSIS — D631 Anemia in chronic kidney disease: Secondary | ICD-10-CM | POA: Diagnosis not present

## 2012-03-19 DIAGNOSIS — Z23 Encounter for immunization: Secondary | ICD-10-CM | POA: Diagnosis not present

## 2012-03-19 DIAGNOSIS — N186 End stage renal disease: Secondary | ICD-10-CM | POA: Diagnosis not present

## 2012-03-19 DIAGNOSIS — N039 Chronic nephritic syndrome with unspecified morphologic changes: Secondary | ICD-10-CM | POA: Diagnosis not present

## 2012-03-19 DIAGNOSIS — D631 Anemia in chronic kidney disease: Secondary | ICD-10-CM | POA: Diagnosis not present

## 2012-03-19 DIAGNOSIS — D509 Iron deficiency anemia, unspecified: Secondary | ICD-10-CM | POA: Diagnosis not present

## 2012-03-19 DIAGNOSIS — N2581 Secondary hyperparathyroidism of renal origin: Secondary | ICD-10-CM | POA: Diagnosis not present

## 2012-03-22 DIAGNOSIS — N186 End stage renal disease: Secondary | ICD-10-CM | POA: Diagnosis not present

## 2012-03-22 DIAGNOSIS — N2581 Secondary hyperparathyroidism of renal origin: Secondary | ICD-10-CM | POA: Diagnosis not present

## 2012-03-22 DIAGNOSIS — N039 Chronic nephritic syndrome with unspecified morphologic changes: Secondary | ICD-10-CM | POA: Diagnosis not present

## 2012-03-22 DIAGNOSIS — D631 Anemia in chronic kidney disease: Secondary | ICD-10-CM | POA: Diagnosis not present

## 2012-03-22 DIAGNOSIS — D509 Iron deficiency anemia, unspecified: Secondary | ICD-10-CM | POA: Diagnosis not present

## 2012-03-22 DIAGNOSIS — Z23 Encounter for immunization: Secondary | ICD-10-CM | POA: Diagnosis not present

## 2012-03-24 DIAGNOSIS — N186 End stage renal disease: Secondary | ICD-10-CM | POA: Diagnosis not present

## 2012-03-24 DIAGNOSIS — Z23 Encounter for immunization: Secondary | ICD-10-CM | POA: Diagnosis not present

## 2012-03-24 DIAGNOSIS — E119 Type 2 diabetes mellitus without complications: Secondary | ICD-10-CM | POA: Diagnosis not present

## 2012-03-24 DIAGNOSIS — D509 Iron deficiency anemia, unspecified: Secondary | ICD-10-CM | POA: Diagnosis not present

## 2012-03-24 DIAGNOSIS — N2581 Secondary hyperparathyroidism of renal origin: Secondary | ICD-10-CM | POA: Diagnosis not present

## 2012-03-24 DIAGNOSIS — N039 Chronic nephritic syndrome with unspecified morphologic changes: Secondary | ICD-10-CM | POA: Diagnosis not present

## 2012-03-24 DIAGNOSIS — D631 Anemia in chronic kidney disease: Secondary | ICD-10-CM | POA: Diagnosis not present

## 2012-03-26 DIAGNOSIS — N186 End stage renal disease: Secondary | ICD-10-CM | POA: Diagnosis not present

## 2012-03-26 DIAGNOSIS — N2581 Secondary hyperparathyroidism of renal origin: Secondary | ICD-10-CM | POA: Diagnosis not present

## 2012-03-26 DIAGNOSIS — Z23 Encounter for immunization: Secondary | ICD-10-CM | POA: Diagnosis not present

## 2012-03-26 DIAGNOSIS — D509 Iron deficiency anemia, unspecified: Secondary | ICD-10-CM | POA: Diagnosis not present

## 2012-03-26 DIAGNOSIS — D631 Anemia in chronic kidney disease: Secondary | ICD-10-CM | POA: Diagnosis not present

## 2012-03-29 DIAGNOSIS — D631 Anemia in chronic kidney disease: Secondary | ICD-10-CM | POA: Diagnosis not present

## 2012-03-29 DIAGNOSIS — D509 Iron deficiency anemia, unspecified: Secondary | ICD-10-CM | POA: Diagnosis not present

## 2012-03-29 DIAGNOSIS — N039 Chronic nephritic syndrome with unspecified morphologic changes: Secondary | ICD-10-CM | POA: Diagnosis not present

## 2012-03-29 DIAGNOSIS — N186 End stage renal disease: Secondary | ICD-10-CM | POA: Diagnosis not present

## 2012-03-29 DIAGNOSIS — Z23 Encounter for immunization: Secondary | ICD-10-CM | POA: Diagnosis not present

## 2012-03-29 DIAGNOSIS — N2581 Secondary hyperparathyroidism of renal origin: Secondary | ICD-10-CM | POA: Diagnosis not present

## 2012-03-31 DIAGNOSIS — D631 Anemia in chronic kidney disease: Secondary | ICD-10-CM | POA: Diagnosis not present

## 2012-03-31 DIAGNOSIS — N186 End stage renal disease: Secondary | ICD-10-CM | POA: Diagnosis not present

## 2012-03-31 DIAGNOSIS — N2581 Secondary hyperparathyroidism of renal origin: Secondary | ICD-10-CM | POA: Diagnosis not present

## 2012-03-31 DIAGNOSIS — Z23 Encounter for immunization: Secondary | ICD-10-CM | POA: Diagnosis not present

## 2012-03-31 DIAGNOSIS — D509 Iron deficiency anemia, unspecified: Secondary | ICD-10-CM | POA: Diagnosis not present

## 2012-04-02 DIAGNOSIS — D631 Anemia in chronic kidney disease: Secondary | ICD-10-CM | POA: Diagnosis not present

## 2012-04-02 DIAGNOSIS — N039 Chronic nephritic syndrome with unspecified morphologic changes: Secondary | ICD-10-CM | POA: Diagnosis not present

## 2012-04-02 DIAGNOSIS — D509 Iron deficiency anemia, unspecified: Secondary | ICD-10-CM | POA: Diagnosis not present

## 2012-04-02 DIAGNOSIS — N2581 Secondary hyperparathyroidism of renal origin: Secondary | ICD-10-CM | POA: Diagnosis not present

## 2012-04-02 DIAGNOSIS — Z23 Encounter for immunization: Secondary | ICD-10-CM | POA: Diagnosis not present

## 2012-04-02 DIAGNOSIS — N186 End stage renal disease: Secondary | ICD-10-CM | POA: Diagnosis not present

## 2012-04-05 DIAGNOSIS — D509 Iron deficiency anemia, unspecified: Secondary | ICD-10-CM | POA: Diagnosis not present

## 2012-04-05 DIAGNOSIS — D631 Anemia in chronic kidney disease: Secondary | ICD-10-CM | POA: Diagnosis not present

## 2012-04-05 DIAGNOSIS — N186 End stage renal disease: Secondary | ICD-10-CM | POA: Diagnosis not present

## 2012-04-05 DIAGNOSIS — N2581 Secondary hyperparathyroidism of renal origin: Secondary | ICD-10-CM | POA: Diagnosis not present

## 2012-04-05 DIAGNOSIS — Z23 Encounter for immunization: Secondary | ICD-10-CM | POA: Diagnosis not present

## 2012-04-07 DIAGNOSIS — N186 End stage renal disease: Secondary | ICD-10-CM | POA: Diagnosis not present

## 2012-04-07 DIAGNOSIS — N2581 Secondary hyperparathyroidism of renal origin: Secondary | ICD-10-CM | POA: Diagnosis not present

## 2012-04-07 DIAGNOSIS — D509 Iron deficiency anemia, unspecified: Secondary | ICD-10-CM | POA: Diagnosis not present

## 2012-04-07 DIAGNOSIS — D631 Anemia in chronic kidney disease: Secondary | ICD-10-CM | POA: Diagnosis not present

## 2012-04-07 DIAGNOSIS — Z23 Encounter for immunization: Secondary | ICD-10-CM | POA: Diagnosis not present

## 2012-04-09 DIAGNOSIS — N186 End stage renal disease: Secondary | ICD-10-CM | POA: Diagnosis not present

## 2012-04-09 DIAGNOSIS — Z23 Encounter for immunization: Secondary | ICD-10-CM | POA: Diagnosis not present

## 2012-04-09 DIAGNOSIS — D631 Anemia in chronic kidney disease: Secondary | ICD-10-CM | POA: Diagnosis not present

## 2012-04-09 DIAGNOSIS — N2581 Secondary hyperparathyroidism of renal origin: Secondary | ICD-10-CM | POA: Diagnosis not present

## 2012-04-09 DIAGNOSIS — D509 Iron deficiency anemia, unspecified: Secondary | ICD-10-CM | POA: Diagnosis not present

## 2012-04-12 DIAGNOSIS — Z23 Encounter for immunization: Secondary | ICD-10-CM | POA: Diagnosis not present

## 2012-04-12 DIAGNOSIS — N186 End stage renal disease: Secondary | ICD-10-CM | POA: Diagnosis not present

## 2012-04-12 DIAGNOSIS — D509 Iron deficiency anemia, unspecified: Secondary | ICD-10-CM | POA: Diagnosis not present

## 2012-04-12 DIAGNOSIS — D631 Anemia in chronic kidney disease: Secondary | ICD-10-CM | POA: Diagnosis not present

## 2012-04-12 DIAGNOSIS — N2581 Secondary hyperparathyroidism of renal origin: Secondary | ICD-10-CM | POA: Diagnosis not present

## 2012-04-14 DIAGNOSIS — N039 Chronic nephritic syndrome with unspecified morphologic changes: Secondary | ICD-10-CM | POA: Diagnosis not present

## 2012-04-14 DIAGNOSIS — D631 Anemia in chronic kidney disease: Secondary | ICD-10-CM | POA: Diagnosis not present

## 2012-04-14 DIAGNOSIS — D509 Iron deficiency anemia, unspecified: Secondary | ICD-10-CM | POA: Diagnosis not present

## 2012-04-14 DIAGNOSIS — N186 End stage renal disease: Secondary | ICD-10-CM | POA: Diagnosis not present

## 2012-04-14 DIAGNOSIS — N2581 Secondary hyperparathyroidism of renal origin: Secondary | ICD-10-CM | POA: Diagnosis not present

## 2012-04-14 DIAGNOSIS — Z23 Encounter for immunization: Secondary | ICD-10-CM | POA: Diagnosis not present

## 2012-04-16 DIAGNOSIS — N2581 Secondary hyperparathyroidism of renal origin: Secondary | ICD-10-CM | POA: Diagnosis not present

## 2012-04-16 DIAGNOSIS — D631 Anemia in chronic kidney disease: Secondary | ICD-10-CM | POA: Diagnosis not present

## 2012-04-16 DIAGNOSIS — Z23 Encounter for immunization: Secondary | ICD-10-CM | POA: Diagnosis not present

## 2012-04-16 DIAGNOSIS — D509 Iron deficiency anemia, unspecified: Secondary | ICD-10-CM | POA: Diagnosis not present

## 2012-04-16 DIAGNOSIS — N186 End stage renal disease: Secondary | ICD-10-CM | POA: Diagnosis not present

## 2012-04-19 DIAGNOSIS — D631 Anemia in chronic kidney disease: Secondary | ICD-10-CM | POA: Diagnosis not present

## 2012-04-19 DIAGNOSIS — N186 End stage renal disease: Secondary | ICD-10-CM | POA: Diagnosis not present

## 2012-04-19 DIAGNOSIS — N2581 Secondary hyperparathyroidism of renal origin: Secondary | ICD-10-CM | POA: Diagnosis not present

## 2012-04-19 DIAGNOSIS — D509 Iron deficiency anemia, unspecified: Secondary | ICD-10-CM | POA: Diagnosis not present

## 2012-04-21 DIAGNOSIS — N186 End stage renal disease: Secondary | ICD-10-CM | POA: Diagnosis not present

## 2012-05-14 DIAGNOSIS — N186 End stage renal disease: Secondary | ICD-10-CM | POA: Diagnosis not present

## 2012-05-17 DIAGNOSIS — D509 Iron deficiency anemia, unspecified: Secondary | ICD-10-CM | POA: Diagnosis not present

## 2012-05-17 DIAGNOSIS — N186 End stage renal disease: Secondary | ICD-10-CM | POA: Diagnosis not present

## 2012-05-17 DIAGNOSIS — N039 Chronic nephritic syndrome with unspecified morphologic changes: Secondary | ICD-10-CM | POA: Diagnosis not present

## 2012-05-17 DIAGNOSIS — D631 Anemia in chronic kidney disease: Secondary | ICD-10-CM | POA: Diagnosis not present

## 2012-05-17 DIAGNOSIS — N2581 Secondary hyperparathyroidism of renal origin: Secondary | ICD-10-CM | POA: Diagnosis not present

## 2012-05-19 DIAGNOSIS — N039 Chronic nephritic syndrome with unspecified morphologic changes: Secondary | ICD-10-CM | POA: Diagnosis not present

## 2012-05-19 DIAGNOSIS — N186 End stage renal disease: Secondary | ICD-10-CM | POA: Diagnosis not present

## 2012-05-19 DIAGNOSIS — N2581 Secondary hyperparathyroidism of renal origin: Secondary | ICD-10-CM | POA: Diagnosis not present

## 2012-05-19 DIAGNOSIS — D631 Anemia in chronic kidney disease: Secondary | ICD-10-CM | POA: Diagnosis not present

## 2012-05-19 DIAGNOSIS — D509 Iron deficiency anemia, unspecified: Secondary | ICD-10-CM | POA: Diagnosis not present

## 2012-05-20 DIAGNOSIS — N186 End stage renal disease: Secondary | ICD-10-CM | POA: Diagnosis not present

## 2012-05-20 DIAGNOSIS — T82898A Other specified complication of vascular prosthetic devices, implants and grafts, initial encounter: Secondary | ICD-10-CM | POA: Diagnosis not present

## 2012-05-20 DIAGNOSIS — I871 Compression of vein: Secondary | ICD-10-CM | POA: Diagnosis not present

## 2012-05-24 DIAGNOSIS — N039 Chronic nephritic syndrome with unspecified morphologic changes: Secondary | ICD-10-CM | POA: Diagnosis not present

## 2012-05-24 DIAGNOSIS — D509 Iron deficiency anemia, unspecified: Secondary | ICD-10-CM | POA: Diagnosis not present

## 2012-05-24 DIAGNOSIS — N2581 Secondary hyperparathyroidism of renal origin: Secondary | ICD-10-CM | POA: Diagnosis not present

## 2012-05-24 DIAGNOSIS — D631 Anemia in chronic kidney disease: Secondary | ICD-10-CM | POA: Diagnosis not present

## 2012-05-24 DIAGNOSIS — N186 End stage renal disease: Secondary | ICD-10-CM | POA: Diagnosis not present

## 2012-05-26 DIAGNOSIS — D509 Iron deficiency anemia, unspecified: Secondary | ICD-10-CM | POA: Diagnosis not present

## 2012-05-26 DIAGNOSIS — D631 Anemia in chronic kidney disease: Secondary | ICD-10-CM | POA: Diagnosis not present

## 2012-05-26 DIAGNOSIS — N2581 Secondary hyperparathyroidism of renal origin: Secondary | ICD-10-CM | POA: Diagnosis not present

## 2012-05-26 DIAGNOSIS — N186 End stage renal disease: Secondary | ICD-10-CM | POA: Diagnosis not present

## 2012-05-31 DIAGNOSIS — D631 Anemia in chronic kidney disease: Secondary | ICD-10-CM | POA: Diagnosis not present

## 2012-05-31 DIAGNOSIS — I871 Compression of vein: Secondary | ICD-10-CM | POA: Diagnosis not present

## 2012-05-31 DIAGNOSIS — T82898A Other specified complication of vascular prosthetic devices, implants and grafts, initial encounter: Secondary | ICD-10-CM | POA: Diagnosis not present

## 2012-05-31 DIAGNOSIS — N186 End stage renal disease: Secondary | ICD-10-CM | POA: Diagnosis not present

## 2012-05-31 DIAGNOSIS — N2581 Secondary hyperparathyroidism of renal origin: Secondary | ICD-10-CM | POA: Diagnosis not present

## 2012-05-31 DIAGNOSIS — D509 Iron deficiency anemia, unspecified: Secondary | ICD-10-CM | POA: Diagnosis not present

## 2012-06-02 DIAGNOSIS — D631 Anemia in chronic kidney disease: Secondary | ICD-10-CM | POA: Diagnosis not present

## 2012-06-02 DIAGNOSIS — D509 Iron deficiency anemia, unspecified: Secondary | ICD-10-CM | POA: Diagnosis not present

## 2012-06-02 DIAGNOSIS — N2581 Secondary hyperparathyroidism of renal origin: Secondary | ICD-10-CM | POA: Diagnosis not present

## 2012-06-02 DIAGNOSIS — N186 End stage renal disease: Secondary | ICD-10-CM | POA: Diagnosis not present

## 2012-06-04 DIAGNOSIS — D509 Iron deficiency anemia, unspecified: Secondary | ICD-10-CM | POA: Diagnosis not present

## 2012-06-04 DIAGNOSIS — N186 End stage renal disease: Secondary | ICD-10-CM | POA: Diagnosis not present

## 2012-06-04 DIAGNOSIS — N2581 Secondary hyperparathyroidism of renal origin: Secondary | ICD-10-CM | POA: Diagnosis not present

## 2012-06-04 DIAGNOSIS — D631 Anemia in chronic kidney disease: Secondary | ICD-10-CM | POA: Diagnosis not present

## 2012-06-07 DIAGNOSIS — N039 Chronic nephritic syndrome with unspecified morphologic changes: Secondary | ICD-10-CM | POA: Diagnosis not present

## 2012-06-07 DIAGNOSIS — N2581 Secondary hyperparathyroidism of renal origin: Secondary | ICD-10-CM | POA: Diagnosis not present

## 2012-06-07 DIAGNOSIS — D631 Anemia in chronic kidney disease: Secondary | ICD-10-CM | POA: Diagnosis not present

## 2012-06-07 DIAGNOSIS — N186 End stage renal disease: Secondary | ICD-10-CM | POA: Diagnosis not present

## 2012-06-07 DIAGNOSIS — D509 Iron deficiency anemia, unspecified: Secondary | ICD-10-CM | POA: Diagnosis not present

## 2012-06-09 DIAGNOSIS — N186 End stage renal disease: Secondary | ICD-10-CM | POA: Diagnosis not present

## 2012-06-09 DIAGNOSIS — D631 Anemia in chronic kidney disease: Secondary | ICD-10-CM | POA: Diagnosis not present

## 2012-06-09 DIAGNOSIS — N2581 Secondary hyperparathyroidism of renal origin: Secondary | ICD-10-CM | POA: Diagnosis not present

## 2012-06-09 DIAGNOSIS — D509 Iron deficiency anemia, unspecified: Secondary | ICD-10-CM | POA: Diagnosis not present

## 2012-06-11 DIAGNOSIS — D631 Anemia in chronic kidney disease: Secondary | ICD-10-CM | POA: Diagnosis not present

## 2012-06-11 DIAGNOSIS — N2581 Secondary hyperparathyroidism of renal origin: Secondary | ICD-10-CM | POA: Diagnosis not present

## 2012-06-11 DIAGNOSIS — N039 Chronic nephritic syndrome with unspecified morphologic changes: Secondary | ICD-10-CM | POA: Diagnosis not present

## 2012-06-11 DIAGNOSIS — N186 End stage renal disease: Secondary | ICD-10-CM | POA: Diagnosis not present

## 2012-06-11 DIAGNOSIS — D509 Iron deficiency anemia, unspecified: Secondary | ICD-10-CM | POA: Diagnosis not present

## 2012-06-14 DIAGNOSIS — N186 End stage renal disease: Secondary | ICD-10-CM | POA: Diagnosis not present

## 2012-06-14 DIAGNOSIS — D631 Anemia in chronic kidney disease: Secondary | ICD-10-CM | POA: Diagnosis not present

## 2012-06-14 DIAGNOSIS — N2581 Secondary hyperparathyroidism of renal origin: Secondary | ICD-10-CM | POA: Diagnosis not present

## 2012-06-14 DIAGNOSIS — D509 Iron deficiency anemia, unspecified: Secondary | ICD-10-CM | POA: Diagnosis not present

## 2012-06-16 DIAGNOSIS — N2581 Secondary hyperparathyroidism of renal origin: Secondary | ICD-10-CM | POA: Diagnosis not present

## 2012-06-16 DIAGNOSIS — N186 End stage renal disease: Secondary | ICD-10-CM | POA: Diagnosis not present

## 2012-06-16 DIAGNOSIS — D631 Anemia in chronic kidney disease: Secondary | ICD-10-CM | POA: Diagnosis not present

## 2012-06-16 DIAGNOSIS — D509 Iron deficiency anemia, unspecified: Secondary | ICD-10-CM | POA: Diagnosis not present

## 2012-06-18 DIAGNOSIS — N2581 Secondary hyperparathyroidism of renal origin: Secondary | ICD-10-CM | POA: Diagnosis not present

## 2012-06-18 DIAGNOSIS — D631 Anemia in chronic kidney disease: Secondary | ICD-10-CM | POA: Diagnosis not present

## 2012-06-18 DIAGNOSIS — D509 Iron deficiency anemia, unspecified: Secondary | ICD-10-CM | POA: Diagnosis not present

## 2012-06-18 DIAGNOSIS — N186 End stage renal disease: Secondary | ICD-10-CM | POA: Diagnosis not present

## 2012-06-21 DIAGNOSIS — D509 Iron deficiency anemia, unspecified: Secondary | ICD-10-CM | POA: Diagnosis not present

## 2012-06-21 DIAGNOSIS — N186 End stage renal disease: Secondary | ICD-10-CM | POA: Diagnosis not present

## 2012-06-21 DIAGNOSIS — D631 Anemia in chronic kidney disease: Secondary | ICD-10-CM | POA: Diagnosis not present

## 2012-06-21 DIAGNOSIS — N2581 Secondary hyperparathyroidism of renal origin: Secondary | ICD-10-CM | POA: Diagnosis not present

## 2012-06-21 DIAGNOSIS — N039 Chronic nephritic syndrome with unspecified morphologic changes: Secondary | ICD-10-CM | POA: Diagnosis not present

## 2012-06-23 DIAGNOSIS — D509 Iron deficiency anemia, unspecified: Secondary | ICD-10-CM | POA: Diagnosis not present

## 2012-06-23 DIAGNOSIS — N186 End stage renal disease: Secondary | ICD-10-CM | POA: Diagnosis not present

## 2012-06-23 DIAGNOSIS — E119 Type 2 diabetes mellitus without complications: Secondary | ICD-10-CM | POA: Diagnosis not present

## 2012-06-23 DIAGNOSIS — D631 Anemia in chronic kidney disease: Secondary | ICD-10-CM | POA: Diagnosis not present

## 2012-06-23 DIAGNOSIS — N2581 Secondary hyperparathyroidism of renal origin: Secondary | ICD-10-CM | POA: Diagnosis not present

## 2012-06-25 DIAGNOSIS — D509 Iron deficiency anemia, unspecified: Secondary | ICD-10-CM | POA: Diagnosis not present

## 2012-06-25 DIAGNOSIS — D631 Anemia in chronic kidney disease: Secondary | ICD-10-CM | POA: Diagnosis not present

## 2012-06-25 DIAGNOSIS — N2581 Secondary hyperparathyroidism of renal origin: Secondary | ICD-10-CM | POA: Diagnosis not present

## 2012-06-25 DIAGNOSIS — N186 End stage renal disease: Secondary | ICD-10-CM | POA: Diagnosis not present

## 2012-06-28 DIAGNOSIS — D509 Iron deficiency anemia, unspecified: Secondary | ICD-10-CM | POA: Diagnosis not present

## 2012-06-28 DIAGNOSIS — D631 Anemia in chronic kidney disease: Secondary | ICD-10-CM | POA: Diagnosis not present

## 2012-06-28 DIAGNOSIS — N2581 Secondary hyperparathyroidism of renal origin: Secondary | ICD-10-CM | POA: Diagnosis not present

## 2012-06-28 DIAGNOSIS — N186 End stage renal disease: Secondary | ICD-10-CM | POA: Diagnosis not present

## 2012-06-29 DIAGNOSIS — N186 End stage renal disease: Secondary | ICD-10-CM | POA: Diagnosis not present

## 2012-06-29 DIAGNOSIS — T82898A Other specified complication of vascular prosthetic devices, implants and grafts, initial encounter: Secondary | ICD-10-CM | POA: Diagnosis not present

## 2012-06-30 DIAGNOSIS — N039 Chronic nephritic syndrome with unspecified morphologic changes: Secondary | ICD-10-CM | POA: Diagnosis not present

## 2012-06-30 DIAGNOSIS — N2581 Secondary hyperparathyroidism of renal origin: Secondary | ICD-10-CM | POA: Diagnosis not present

## 2012-06-30 DIAGNOSIS — N186 End stage renal disease: Secondary | ICD-10-CM | POA: Diagnosis not present

## 2012-06-30 DIAGNOSIS — D509 Iron deficiency anemia, unspecified: Secondary | ICD-10-CM | POA: Diagnosis not present

## 2012-06-30 DIAGNOSIS — D631 Anemia in chronic kidney disease: Secondary | ICD-10-CM | POA: Diagnosis not present

## 2012-07-02 DIAGNOSIS — D631 Anemia in chronic kidney disease: Secondary | ICD-10-CM | POA: Diagnosis not present

## 2012-07-02 DIAGNOSIS — D509 Iron deficiency anemia, unspecified: Secondary | ICD-10-CM | POA: Diagnosis not present

## 2012-07-02 DIAGNOSIS — N186 End stage renal disease: Secondary | ICD-10-CM | POA: Diagnosis not present

## 2012-07-02 DIAGNOSIS — N2581 Secondary hyperparathyroidism of renal origin: Secondary | ICD-10-CM | POA: Diagnosis not present

## 2012-07-02 DIAGNOSIS — N039 Chronic nephritic syndrome with unspecified morphologic changes: Secondary | ICD-10-CM | POA: Diagnosis not present

## 2012-07-05 DIAGNOSIS — N186 End stage renal disease: Secondary | ICD-10-CM | POA: Diagnosis not present

## 2012-07-05 DIAGNOSIS — N2581 Secondary hyperparathyroidism of renal origin: Secondary | ICD-10-CM | POA: Diagnosis not present

## 2012-07-05 DIAGNOSIS — N039 Chronic nephritic syndrome with unspecified morphologic changes: Secondary | ICD-10-CM | POA: Diagnosis not present

## 2012-07-05 DIAGNOSIS — D631 Anemia in chronic kidney disease: Secondary | ICD-10-CM | POA: Diagnosis not present

## 2012-07-05 DIAGNOSIS — D509 Iron deficiency anemia, unspecified: Secondary | ICD-10-CM | POA: Diagnosis not present

## 2012-07-07 DIAGNOSIS — N186 End stage renal disease: Secondary | ICD-10-CM | POA: Diagnosis not present

## 2012-07-07 DIAGNOSIS — N2581 Secondary hyperparathyroidism of renal origin: Secondary | ICD-10-CM | POA: Diagnosis not present

## 2012-07-07 DIAGNOSIS — D509 Iron deficiency anemia, unspecified: Secondary | ICD-10-CM | POA: Diagnosis not present

## 2012-07-07 DIAGNOSIS — D631 Anemia in chronic kidney disease: Secondary | ICD-10-CM | POA: Diagnosis not present

## 2012-07-07 DIAGNOSIS — N039 Chronic nephritic syndrome with unspecified morphologic changes: Secondary | ICD-10-CM | POA: Diagnosis not present

## 2012-07-08 DIAGNOSIS — T82898A Other specified complication of vascular prosthetic devices, implants and grafts, initial encounter: Secondary | ICD-10-CM | POA: Diagnosis not present

## 2012-07-08 DIAGNOSIS — N186 End stage renal disease: Secondary | ICD-10-CM | POA: Diagnosis not present

## 2012-07-09 DIAGNOSIS — N186 End stage renal disease: Secondary | ICD-10-CM | POA: Diagnosis not present

## 2012-07-09 DIAGNOSIS — N2581 Secondary hyperparathyroidism of renal origin: Secondary | ICD-10-CM | POA: Diagnosis not present

## 2012-07-09 DIAGNOSIS — D509 Iron deficiency anemia, unspecified: Secondary | ICD-10-CM | POA: Diagnosis not present

## 2012-07-09 DIAGNOSIS — N039 Chronic nephritic syndrome with unspecified morphologic changes: Secondary | ICD-10-CM | POA: Diagnosis not present

## 2012-07-09 DIAGNOSIS — D631 Anemia in chronic kidney disease: Secondary | ICD-10-CM | POA: Diagnosis not present

## 2012-07-12 DIAGNOSIS — D631 Anemia in chronic kidney disease: Secondary | ICD-10-CM | POA: Diagnosis not present

## 2012-07-12 DIAGNOSIS — N186 End stage renal disease: Secondary | ICD-10-CM | POA: Diagnosis not present

## 2012-07-12 DIAGNOSIS — D509 Iron deficiency anemia, unspecified: Secondary | ICD-10-CM | POA: Diagnosis not present

## 2012-07-12 DIAGNOSIS — N2581 Secondary hyperparathyroidism of renal origin: Secondary | ICD-10-CM | POA: Diagnosis not present

## 2012-07-12 DIAGNOSIS — N039 Chronic nephritic syndrome with unspecified morphologic changes: Secondary | ICD-10-CM | POA: Diagnosis not present

## 2012-07-14 DIAGNOSIS — D631 Anemia in chronic kidney disease: Secondary | ICD-10-CM | POA: Diagnosis not present

## 2012-07-14 DIAGNOSIS — N2581 Secondary hyperparathyroidism of renal origin: Secondary | ICD-10-CM | POA: Diagnosis not present

## 2012-07-14 DIAGNOSIS — D509 Iron deficiency anemia, unspecified: Secondary | ICD-10-CM | POA: Diagnosis not present

## 2012-07-14 DIAGNOSIS — N186 End stage renal disease: Secondary | ICD-10-CM | POA: Diagnosis not present

## 2012-07-16 DIAGNOSIS — D631 Anemia in chronic kidney disease: Secondary | ICD-10-CM | POA: Diagnosis not present

## 2012-07-16 DIAGNOSIS — N186 End stage renal disease: Secondary | ICD-10-CM | POA: Diagnosis not present

## 2012-07-16 DIAGNOSIS — D509 Iron deficiency anemia, unspecified: Secondary | ICD-10-CM | POA: Diagnosis not present

## 2012-07-16 DIAGNOSIS — N2581 Secondary hyperparathyroidism of renal origin: Secondary | ICD-10-CM | POA: Diagnosis not present

## 2012-07-19 DIAGNOSIS — N039 Chronic nephritic syndrome with unspecified morphologic changes: Secondary | ICD-10-CM | POA: Diagnosis not present

## 2012-07-19 DIAGNOSIS — D509 Iron deficiency anemia, unspecified: Secondary | ICD-10-CM | POA: Diagnosis not present

## 2012-07-19 DIAGNOSIS — N186 End stage renal disease: Secondary | ICD-10-CM | POA: Diagnosis not present

## 2012-07-19 DIAGNOSIS — N2581 Secondary hyperparathyroidism of renal origin: Secondary | ICD-10-CM | POA: Diagnosis not present

## 2012-07-19 DIAGNOSIS — D631 Anemia in chronic kidney disease: Secondary | ICD-10-CM | POA: Diagnosis not present

## 2012-07-21 DIAGNOSIS — N2581 Secondary hyperparathyroidism of renal origin: Secondary | ICD-10-CM | POA: Diagnosis not present

## 2012-07-21 DIAGNOSIS — N039 Chronic nephritic syndrome with unspecified morphologic changes: Secondary | ICD-10-CM | POA: Diagnosis not present

## 2012-07-21 DIAGNOSIS — D631 Anemia in chronic kidney disease: Secondary | ICD-10-CM | POA: Diagnosis not present

## 2012-07-21 DIAGNOSIS — N186 End stage renal disease: Secondary | ICD-10-CM | POA: Diagnosis not present

## 2012-07-21 DIAGNOSIS — D509 Iron deficiency anemia, unspecified: Secondary | ICD-10-CM | POA: Diagnosis not present

## 2012-07-23 DIAGNOSIS — N186 End stage renal disease: Secondary | ICD-10-CM | POA: Diagnosis not present

## 2012-07-23 DIAGNOSIS — D509 Iron deficiency anemia, unspecified: Secondary | ICD-10-CM | POA: Diagnosis not present

## 2012-07-23 DIAGNOSIS — D631 Anemia in chronic kidney disease: Secondary | ICD-10-CM | POA: Diagnosis not present

## 2012-07-23 DIAGNOSIS — N2581 Secondary hyperparathyroidism of renal origin: Secondary | ICD-10-CM | POA: Diagnosis not present

## 2012-07-26 DIAGNOSIS — N039 Chronic nephritic syndrome with unspecified morphologic changes: Secondary | ICD-10-CM | POA: Diagnosis not present

## 2012-07-26 DIAGNOSIS — D509 Iron deficiency anemia, unspecified: Secondary | ICD-10-CM | POA: Diagnosis not present

## 2012-07-26 DIAGNOSIS — D631 Anemia in chronic kidney disease: Secondary | ICD-10-CM | POA: Diagnosis not present

## 2012-07-26 DIAGNOSIS — N2581 Secondary hyperparathyroidism of renal origin: Secondary | ICD-10-CM | POA: Diagnosis not present

## 2012-07-26 DIAGNOSIS — N186 End stage renal disease: Secondary | ICD-10-CM | POA: Diagnosis not present

## 2012-07-28 DIAGNOSIS — N2581 Secondary hyperparathyroidism of renal origin: Secondary | ICD-10-CM | POA: Diagnosis not present

## 2012-07-28 DIAGNOSIS — D631 Anemia in chronic kidney disease: Secondary | ICD-10-CM | POA: Diagnosis not present

## 2012-07-28 DIAGNOSIS — D509 Iron deficiency anemia, unspecified: Secondary | ICD-10-CM | POA: Diagnosis not present

## 2012-07-28 DIAGNOSIS — N039 Chronic nephritic syndrome with unspecified morphologic changes: Secondary | ICD-10-CM | POA: Diagnosis not present

## 2012-07-28 DIAGNOSIS — N186 End stage renal disease: Secondary | ICD-10-CM | POA: Diagnosis not present

## 2012-07-30 DIAGNOSIS — D631 Anemia in chronic kidney disease: Secondary | ICD-10-CM | POA: Diagnosis not present

## 2012-07-30 DIAGNOSIS — N2581 Secondary hyperparathyroidism of renal origin: Secondary | ICD-10-CM | POA: Diagnosis not present

## 2012-07-30 DIAGNOSIS — D509 Iron deficiency anemia, unspecified: Secondary | ICD-10-CM | POA: Diagnosis not present

## 2012-07-30 DIAGNOSIS — N186 End stage renal disease: Secondary | ICD-10-CM | POA: Diagnosis not present

## 2012-08-02 DIAGNOSIS — D631 Anemia in chronic kidney disease: Secondary | ICD-10-CM | POA: Diagnosis not present

## 2012-08-02 DIAGNOSIS — N186 End stage renal disease: Secondary | ICD-10-CM | POA: Diagnosis not present

## 2012-08-02 DIAGNOSIS — N2581 Secondary hyperparathyroidism of renal origin: Secondary | ICD-10-CM | POA: Diagnosis not present

## 2012-08-02 DIAGNOSIS — D509 Iron deficiency anemia, unspecified: Secondary | ICD-10-CM | POA: Diagnosis not present

## 2012-08-03 DIAGNOSIS — R569 Unspecified convulsions: Secondary | ICD-10-CM | POA: Diagnosis not present

## 2012-08-03 DIAGNOSIS — I129 Hypertensive chronic kidney disease with stage 1 through stage 4 chronic kidney disease, or unspecified chronic kidney disease: Secondary | ICD-10-CM | POA: Diagnosis not present

## 2012-08-03 DIAGNOSIS — L930 Discoid lupus erythematosus: Secondary | ICD-10-CM | POA: Diagnosis not present

## 2012-08-03 DIAGNOSIS — J301 Allergic rhinitis due to pollen: Secondary | ICD-10-CM | POA: Diagnosis not present

## 2012-08-04 DIAGNOSIS — D509 Iron deficiency anemia, unspecified: Secondary | ICD-10-CM | POA: Diagnosis not present

## 2012-08-04 DIAGNOSIS — D631 Anemia in chronic kidney disease: Secondary | ICD-10-CM | POA: Diagnosis not present

## 2012-08-04 DIAGNOSIS — N2581 Secondary hyperparathyroidism of renal origin: Secondary | ICD-10-CM | POA: Diagnosis not present

## 2012-08-04 DIAGNOSIS — N186 End stage renal disease: Secondary | ICD-10-CM | POA: Diagnosis not present

## 2012-08-06 DIAGNOSIS — D509 Iron deficiency anemia, unspecified: Secondary | ICD-10-CM | POA: Diagnosis not present

## 2012-08-06 DIAGNOSIS — N2581 Secondary hyperparathyroidism of renal origin: Secondary | ICD-10-CM | POA: Diagnosis not present

## 2012-08-06 DIAGNOSIS — N186 End stage renal disease: Secondary | ICD-10-CM | POA: Diagnosis not present

## 2012-08-06 DIAGNOSIS — D631 Anemia in chronic kidney disease: Secondary | ICD-10-CM | POA: Diagnosis not present

## 2012-08-09 DIAGNOSIS — D631 Anemia in chronic kidney disease: Secondary | ICD-10-CM | POA: Diagnosis not present

## 2012-08-09 DIAGNOSIS — N2581 Secondary hyperparathyroidism of renal origin: Secondary | ICD-10-CM | POA: Diagnosis not present

## 2012-08-09 DIAGNOSIS — D509 Iron deficiency anemia, unspecified: Secondary | ICD-10-CM | POA: Diagnosis not present

## 2012-08-09 DIAGNOSIS — N186 End stage renal disease: Secondary | ICD-10-CM | POA: Diagnosis not present

## 2012-08-09 DIAGNOSIS — N039 Chronic nephritic syndrome with unspecified morphologic changes: Secondary | ICD-10-CM | POA: Diagnosis not present

## 2012-08-11 DIAGNOSIS — N2581 Secondary hyperparathyroidism of renal origin: Secondary | ICD-10-CM | POA: Diagnosis not present

## 2012-08-11 DIAGNOSIS — N039 Chronic nephritic syndrome with unspecified morphologic changes: Secondary | ICD-10-CM | POA: Diagnosis not present

## 2012-08-11 DIAGNOSIS — D631 Anemia in chronic kidney disease: Secondary | ICD-10-CM | POA: Diagnosis not present

## 2012-08-11 DIAGNOSIS — N186 End stage renal disease: Secondary | ICD-10-CM | POA: Diagnosis not present

## 2012-08-11 DIAGNOSIS — D509 Iron deficiency anemia, unspecified: Secondary | ICD-10-CM | POA: Diagnosis not present

## 2012-08-13 DIAGNOSIS — N2581 Secondary hyperparathyroidism of renal origin: Secondary | ICD-10-CM | POA: Diagnosis not present

## 2012-08-13 DIAGNOSIS — D509 Iron deficiency anemia, unspecified: Secondary | ICD-10-CM | POA: Diagnosis not present

## 2012-08-13 DIAGNOSIS — D631 Anemia in chronic kidney disease: Secondary | ICD-10-CM | POA: Diagnosis not present

## 2012-08-13 DIAGNOSIS — N186 End stage renal disease: Secondary | ICD-10-CM | POA: Diagnosis not present

## 2012-08-14 DIAGNOSIS — N186 End stage renal disease: Secondary | ICD-10-CM | POA: Diagnosis not present

## 2012-08-16 DIAGNOSIS — N2581 Secondary hyperparathyroidism of renal origin: Secondary | ICD-10-CM | POA: Diagnosis not present

## 2012-08-16 DIAGNOSIS — D509 Iron deficiency anemia, unspecified: Secondary | ICD-10-CM | POA: Diagnosis not present

## 2012-08-16 DIAGNOSIS — N186 End stage renal disease: Secondary | ICD-10-CM | POA: Diagnosis not present

## 2012-08-16 DIAGNOSIS — D631 Anemia in chronic kidney disease: Secondary | ICD-10-CM | POA: Diagnosis not present

## 2012-08-18 DIAGNOSIS — N186 End stage renal disease: Secondary | ICD-10-CM | POA: Diagnosis not present

## 2012-09-02 DIAGNOSIS — I10 Essential (primary) hypertension: Secondary | ICD-10-CM | POA: Diagnosis not present

## 2012-09-02 DIAGNOSIS — T82898A Other specified complication of vascular prosthetic devices, implants and grafts, initial encounter: Secondary | ICD-10-CM | POA: Diagnosis not present

## 2012-09-02 DIAGNOSIS — N186 End stage renal disease: Secondary | ICD-10-CM | POA: Diagnosis not present

## 2012-09-02 DIAGNOSIS — F068 Other specified mental disorders due to known physiological condition: Secondary | ICD-10-CM | POA: Diagnosis not present

## 2012-09-03 DIAGNOSIS — I871 Compression of vein: Secondary | ICD-10-CM | POA: Diagnosis not present

## 2012-09-03 DIAGNOSIS — T82898A Other specified complication of vascular prosthetic devices, implants and grafts, initial encounter: Secondary | ICD-10-CM | POA: Diagnosis not present

## 2012-09-03 DIAGNOSIS — N186 End stage renal disease: Secondary | ICD-10-CM | POA: Diagnosis not present

## 2012-09-13 DIAGNOSIS — N186 End stage renal disease: Secondary | ICD-10-CM | POA: Diagnosis not present

## 2012-09-15 DIAGNOSIS — D509 Iron deficiency anemia, unspecified: Secondary | ICD-10-CM | POA: Diagnosis not present

## 2012-09-15 DIAGNOSIS — D631 Anemia in chronic kidney disease: Secondary | ICD-10-CM | POA: Diagnosis not present

## 2012-09-15 DIAGNOSIS — N2581 Secondary hyperparathyroidism of renal origin: Secondary | ICD-10-CM | POA: Diagnosis not present

## 2012-09-15 DIAGNOSIS — N186 End stage renal disease: Secondary | ICD-10-CM | POA: Diagnosis not present

## 2012-09-15 DIAGNOSIS — N039 Chronic nephritic syndrome with unspecified morphologic changes: Secondary | ICD-10-CM | POA: Diagnosis not present

## 2012-09-17 ENCOUNTER — Encounter (HOSPITAL_COMMUNITY): Payer: Self-pay | Admitting: Emergency Medicine

## 2012-09-17 ENCOUNTER — Emergency Department (HOSPITAL_COMMUNITY)
Admission: EM | Admit: 2012-09-17 | Discharge: 2012-09-17 | Disposition: A | Discharge status: Home / Self Care | Payer: Medicare Other | Attending: Emergency Medicine | Admitting: Emergency Medicine

## 2012-09-17 DIAGNOSIS — N186 End stage renal disease: Secondary | ICD-10-CM | POA: Diagnosis not present

## 2012-09-17 DIAGNOSIS — Z87448 Personal history of other diseases of urinary system: Secondary | ICD-10-CM | POA: Insufficient documentation

## 2012-09-17 DIAGNOSIS — Z8739 Personal history of other diseases of the musculoskeletal system and connective tissue: Secondary | ICD-10-CM | POA: Insufficient documentation

## 2012-09-17 DIAGNOSIS — Z79899 Other long term (current) drug therapy: Secondary | ICD-10-CM | POA: Diagnosis not present

## 2012-09-17 DIAGNOSIS — T82868A Thrombosis of vascular prosthetic devices, implants and grafts, initial encounter: Secondary | ICD-10-CM

## 2012-09-17 DIAGNOSIS — Y849 Medical procedure, unspecified as the cause of abnormal reaction of the patient, or of later complication, without mention of misadventure at the time of the procedure: Secondary | ICD-10-CM | POA: Insufficient documentation

## 2012-09-17 DIAGNOSIS — Z992 Dependence on renal dialysis: Secondary | ICD-10-CM | POA: Diagnosis not present

## 2012-09-17 DIAGNOSIS — T82898A Other specified complication of vascular prosthetic devices, implants and grafts, initial encounter: Secondary | ICD-10-CM | POA: Insufficient documentation

## 2012-09-17 DIAGNOSIS — Z7982 Long term (current) use of aspirin: Secondary | ICD-10-CM | POA: Insufficient documentation

## 2012-09-17 DIAGNOSIS — F17200 Nicotine dependence, unspecified, uncomplicated: Secondary | ICD-10-CM | POA: Diagnosis not present

## 2012-09-17 HISTORY — DX: Systemic lupus erythematosus, unspecified: M32.9

## 2012-09-17 HISTORY — DX: Disorder of kidney and ureter, unspecified: N28.9

## 2012-09-17 HISTORY — DX: Reserved for concepts with insufficient information to code with codable children: IMO0002

## 2012-09-17 LAB — POCT I-STAT, CHEM 8
BUN: 26 mg/dL — ABNORMAL HIGH (ref 6–23)
Calcium, Ion: 1.17 mmol/L (ref 1.12–1.23)
Creatinine, Ser: 3.2 mg/dL — ABNORMAL HIGH (ref 0.50–1.10)
Glucose, Bld: 63 mg/dL — ABNORMAL LOW (ref 70–99)
Hemoglobin: 14.3 g/dL (ref 12.0–15.0)
Sodium: 138 mEq/L (ref 135–145)
TCO2: 24 mmol/L (ref 0–100)

## 2012-09-17 LAB — CBC
Hemoglobin: 12 g/dL (ref 12.0–15.0)
MCH: 29.9 pg (ref 26.0–34.0)
MCHC: 35.3 g/dL (ref 30.0–36.0)
MCV: 84.6 fL (ref 78.0–100.0)
RBC: 4.02 MIL/uL (ref 3.87–5.11)

## 2012-09-17 NOTE — ED Notes (Signed)
Pt presents to ED today with complaints of clotted dialysis graft. Pt went to her dialysis treatment today and graft was clotted. Staff at [facility] told pt to go to ED.

## 2012-09-17 NOTE — ED Provider Notes (Signed)
History    CSN: [id number] Arrival date & time 09/17/12  1309  First MD Initiated Contact with Patient 09/17/12 1327     Chief Complaint  Patient presents with  . Vascular Access Problem   (Consider location/radiation/quality/duration/timing/severity/associated sxs/prior Treatment) HPI [name] is a 37 y.o. female who presents to ED with complaint of "clotted dialysis fistula." Pt states she went to dialysis today, unable to get it bc it was clotted. Left upper arm fistula, stats was placed in [place], receives dialysis here in [place]. Was sent here to get fistula unclogged. Pt denies any complaints. No sob, no chest pain.   Past Medical History  Diagnosis Date  . Renal disorder   . Lupus    Past Surgical History  Procedure Laterality Date  . Av fistula placement     History reviewed. No pertinent family history. History  Substance Use Topics  . Smoking status: Current Every Day Smoker    Types: Cigarettes  . Smokeless tobacco: Not on file  . Alcohol Use: Not on file   OB History   Grav Para Term Preterm Abortions TAB SAB Ect Mult Living                 Review of Systems  All other systems reviewed and are negative.    Allergies  Review of patient's allergies indicates no known allergies.  Home Medications   Current Outpatient Rx  Name  Route  Sig  Dispense  Refill  . albuterol (PROVENTIL) (2.5 MG/3ML) 0.083% nebulizer solution   Nebulization   Take 2.5 mg by nebulization 4 (four) times daily as needed (breathing).         . ALPRAZolam (XANAX) 0.25 MG tablet   Oral   Take 0.25 mg by mouth 2 (two) times daily.         Marland Kitchen aspirin EC 81 MG tablet   Oral   Take 81 mg by mouth daily.         . chlorhexidine (PERIDEX) 0.12 % solution   Mouth/Throat   Use as directed 15 mLs in the mouth or throat 2 (two) times daily. Swish for 1 minute and spit out.         . clopidogrel (PLAVIX) 75 MG tablet   Oral   Take 75 mg by mouth daily.          Marland Kitchen docusate sodium (COLACE) 100 MG capsule   Oral   Take 100 mg by mouth daily.         Marland Kitchen epoetin alfa (EPOGEN,PROCRIT) 57846 UNIT/ML injection   Subcutaneous   Inject 4,000 Units into the skin 3 (three) times a week. Tuesday, Wednesday, and saturday         . GuaiFENesin (TUSSIN PO)   Oral   Take 10 mLs by mouth every 6 (six) hours as needed (cough).         . hydroxychloroquine (PLAQUENIL) 200 MG tablet   Oral   Take 200 mg by mouth daily.         Marland Kitchen ipratropium (ATROVENT) 0.02 % nebulizer solution   Nebulization   Take 250 mcg by nebulization 4 (four) times daily as needed (breathing).         Marland Kitchen loratadine (CLARITIN) 10 MG tablet   Oral   Take 10 mg by mouth daily as needed for allergies.         . metoprolol (LOPRESSOR) 50 MG tablet   Oral   Take 50 mg by mouth  2 (two) times daily.         . midodrine (PROAMATINE) 10 MG tablet   Oral   Take 10 mg by mouth 3 (three) times a week. Monday, Wednesday, and Friday before dialysis         . multivitamin (RENA-VIT) TABS tablet   Oral   Take 1 tablet by mouth daily.         . pantoprazole (PROTONIX) 40 MG tablet   Oral   Take 40 mg by mouth daily.         . phenytoin (DILANTIN) 100 MG ER capsule   Oral   Take 100 mg by mouth 2 (two) times daily.         . polyethylene glycol (MIRALAX / GLYCOLAX) packet   Oral   Take 17 g by mouth as needed (for constipation).         . sertraline (ZOLOFT) 100 MG tablet   Oral   Take 100 mg by mouth 2 (two) times daily.         . simvastatin (ZOCOR) 40 MG tablet   Oral   Take 40 mg by mouth daily.          BP 133/77  Pulse 48  Temp(Src) 98 F (36.7 C) (Oral)  Resp 20  Ht 5\' 3"  (1.6 m)  Wt 94 lb 4 oz (42.752 kg)  BMI 16.7 kg/m2  SpO2 100% Physical Exam  Vitals reviewed. Constitutional: She is oriented to person, place, and time. She appears well-developed and well-nourished. No distress.  Cardiovascular: Regular rhythm and normal heart  sounds.   bradycardic  Pulmonary/Chest: Effort normal and breath sounds normal. No respiratory distress. She has no wheezes. She has no rales.  Abdominal: Soft. Bowel sounds are normal. She exhibits no distension. There is no tenderness. There is no rebound.  Musculoskeletal: She exhibits no edema.  Neurological: She is alert and oriented to person, place, and time.  Skin: Skin is warm and dry.  Graft in the left upper arm.    ED Course  Procedures (including critical care time) Labs Reviewed  CBC - Abnormal; Notable for the following:    HCT 34.0 (*)    All other components within normal limits  POCT I-STAT, CHEM 8 - Abnormal; Notable for the following:    BUN 26 (*)    Creatinine, Ser 3.20 (*)    Glucose, Bld 63 (*)    All other components within normal limits   2:48 PM Spoke with Dr. Jimmy Footman who advised to contact pts dialysis center to see whether arrangements were made for her declotting. He checked to see if our center is still opened and it is not. He advised to consult vascular surgery.   Tried calling [facility], they are closed for today.   3:19 PM Spoke with Dr. Kellie Simmering, recommended caling IR  Spoke with Dr. Anselm Pancoast, IR, will be able to get in for dethrombolization tomorrow around 11. Be in Pt. Check in at 9:30am.  Discussed with pt, agreed to the plan.    1. Clotted renal dialysis AV graft, initial encounter     MDM  Pt with clotted left vascular graft. Pt does not appear to be fluid overloaded. She has no complaints. She is bradycardic, but states she has hx of the same. Her electrolytes are non emergent. After several phone calls, pt will be set up for thrombolisis of clot in her graft tomorrow by Dr. Anselm Pancoast, instructed to be there at 9:30. Pt  and her family agree to the plan.   Filed Vitals:   09/17/12 1315 09/17/12 1411 09/17/12 1430 09/17/12 1515  BP: 117/78 133/77 130/83 126/81  Pulse: 48 48 48 47  Temp: 98 F (36.7 C)     TempSrc: Oral      Resp: 16 20    Height: 5\' 3"  (1.6 m)     Weight: 94 lb 4 oz (42.752 kg)     SpO2: 93% 100% 100% 100%     Erica Genta, PA-C 09/17/12 1554

## 2012-09-18 ENCOUNTER — Other Ambulatory Visit (HOSPITAL_COMMUNITY): Payer: Self-pay | Admitting: Nephrology

## 2012-09-18 ENCOUNTER — Encounter (HOSPITAL_COMMUNITY): Payer: Self-pay

## 2012-09-18 ENCOUNTER — Ambulatory Visit (HOSPITAL_COMMUNITY)
Admission: RE | Admit: 2012-09-18 | Discharge: 2012-09-18 | Disposition: A | Discharge status: Home / Self Care | Payer: Medicare Other | Source: Ambulatory Visit | Attending: Nephrology | Admitting: Nephrology

## 2012-09-18 DIAGNOSIS — I82609 Acute embolism and thrombosis of unspecified veins of unspecified upper extremity: Secondary | ICD-10-CM | POA: Diagnosis not present

## 2012-09-18 DIAGNOSIS — N186 End stage renal disease: Secondary | ICD-10-CM

## 2012-09-18 DIAGNOSIS — I871 Compression of vein: Secondary | ICD-10-CM | POA: Insufficient documentation

## 2012-09-18 DIAGNOSIS — Y832 Surgical operation with anastomosis, bypass or graft as the cause of abnormal reaction of the patient, or of later complication, without mention of misadventure at the time of the procedure: Secondary | ICD-10-CM | POA: Insufficient documentation

## 2012-09-18 DIAGNOSIS — M329 Systemic lupus erythematosus, unspecified: Secondary | ICD-10-CM | POA: Insufficient documentation

## 2012-09-18 DIAGNOSIS — Z992 Dependence on renal dialysis: Secondary | ICD-10-CM

## 2012-09-18 DIAGNOSIS — F17200 Nicotine dependence, unspecified, uncomplicated: Secondary | ICD-10-CM | POA: Diagnosis not present

## 2012-09-18 DIAGNOSIS — T82898A Other specified complication of vascular prosthetic devices, implants and grafts, initial encounter: Secondary | ICD-10-CM | POA: Insufficient documentation

## 2012-09-18 LAB — POCT I-STAT 4, (NA,K, GLUC, HGB,HCT)
Glucose, Bld: 79 mg/dL (ref 70–99)
Hemoglobin: 12.2 g/dL (ref 12.0–15.0)
Potassium: 5 mEq/L (ref 3.5–5.1)

## 2012-09-18 MED ORDER — IOHEXOL 300 MG/ML  SOLN
100.0000 mL | Freq: Once | INTRAMUSCULAR | Status: AC | PRN
  Administered 2012-09-18: 60 mL via INTRAVENOUS

## 2012-09-18 MED ORDER — HEPARIN SODIUM (PORCINE) 1000 UNIT/ML IJ SOLN
INTRAMUSCULAR | Status: AC | PRN
  Administered 2012-09-18: 3000 [IU] via INTRAVENOUS

## 2012-09-18 MED ORDER — SODIUM POLYSTYRENE SULFONATE 15 GM/60ML PO SUSP
30.0000 g | Freq: Once | ORAL | Status: AC
  Administered 2012-09-18: 30 g via ORAL
  Filled 2012-09-18: qty 120

## 2012-09-18 MED ORDER — ALTEPLASE 100 MG IV SOLR
INTRAVENOUS | Status: AC | PRN
  Administered 2012-09-18: 2 mg

## 2012-09-18 MED ORDER — SODIUM POLYSTYRENE SULFONATE 15 GM/60ML PO SUSP: 30.0000 g | Freq: Once | ORAL | Status: DC

## 2012-09-18 MED ORDER — HEPARIN SODIUM (PORCINE) 1000 UNIT/ML IJ SOLN
INTRAMUSCULAR | Status: AC
  Filled 2012-09-18: qty 1

## 2012-09-18 MED ORDER — FENTANYL CITRATE 0.05 MG/ML IJ SOLN
INTRAMUSCULAR | Status: AC
  Filled 2012-09-18: qty 4

## 2012-09-18 MED ORDER — MIDAZOLAM HCL 2 MG/2ML IJ SOLN
INTRAMUSCULAR | Status: AC
  Filled 2012-09-18: qty 4

## 2012-09-18 MED ORDER — SODIUM CHLORIDE 0.9 % IV SOLN: Freq: Once | INTRAVENOUS | Status: DC

## 2012-09-18 MED ORDER — ALTEPLASE 100 MG IV SOLR
2.0000 mg | Freq: Once | INTRAVENOUS | Status: DC
  Filled 2012-09-18: qty 2

## 2012-09-18 NOTE — Procedures (Signed)
Successful declot of the left upper arm HeRO graft.  No immediate complication.  See Radiology report.

## 2012-09-18 NOTE — ED Provider Notes (Signed)
Medical screening examination/treatment/procedure(s) were performed by non-physician practitioner and as supervising physician I was immediately available for consultation/collaboration.   Wandra Arthurs, MD 09/18/12 [phone]

## 2012-09-18 NOTE — ED Notes (Signed)
K 5.0  I-Stat

## 2012-09-18 NOTE — ED Notes (Signed)
Did not use sedation meds- Pt didn't need and also BP and HR low. Pt tolerated procedure well

## 2012-09-18 NOTE — H&P (Signed)
[name] is an 37 y.o. female.   Chief Complaint: clotted left arm dialysis graft Last use 7/2: good flow Last attempt: yesterday: clotted Scheduled now for left arm graft thrombolysis and possible angioplasty/stent placement Possible dialysis catheter placement HPI: ESRD; Lupus  Past Medical History  Diagnosis Date  . Renal disorder   . Lupus     Past Surgical History  Procedure Laterality Date  . Av fistula placement      No family history on file. Social History:  reports that she has been smoking Cigarettes.  She has been smoking about 0.00 packs per day. She does not have any smokeless tobacco history on file. Her alcohol and drug histories are not on file.  Allergies: No Known Allergies   (Not in a hospital admission)  Results for orders placed during the hospital encounter of 09/17/12 (from the past 48 hour(s))  CBC     Status: Abnormal   Collection Time    09/17/12  1:54 PM      Result Value Range   WBC 7.1  4.0 - 10.5 K/uL   RBC 4.02  3.87 - 5.11 MIL/uL   Hemoglobin 12.0  12.0 - 15.0 g/dL   HCT 34.0 (*) 36.0 - 46.0 %   MCV 84.6  78.0 - 100.0 fL   MCH 29.9  26.0 - 34.0 pg   MCHC 35.3  30.0 - 36.0 g/dL   RDW 14.2  11.5 - 15.5 %   Platelets 196  150 - 400 K/uL  POCT I-STAT, CHEM 8     Status: Abnormal   Collection Time    09/17/12  2:17 PM      Result Value Range   Sodium 138  135 - 145 mEq/L   Potassium 4.7  3.5 - 5.1 mEq/L   Chloride 104  96 - 112 mEq/L   BUN 26 (*) 6 - 23 mg/dL   Creatinine, Ser 3.20 (*) 0.50 - 1.10 mg/dL   Glucose, Bld 63 (*) 70 - 99 mg/dL   Calcium, Ion 1.17  1.12 - 1.23 mmol/L   TCO2 24  0 - 100 mmol/L   Hemoglobin 14.3  12.0 - 15.0 g/dL   HCT 42.0  36.0 - 46.0 %   No results found.  Review of Systems  Constitutional: Negative for fever.  Respiratory: Negative for shortness of breath.   Cardiovascular: Negative for chest pain.  Gastrointestinal: Negative for nausea, vomiting and abdominal pain.  Neurological:  Negative for headaches.    There were no vitals taken for this visit. Physical Exam  Constitutional: She is oriented to person, place, and time. She appears well-developed and well-nourished.  Cardiovascular: Normal rate and regular rhythm.   No murmur heard. Respiratory: Effort normal and breath sounds normal. She has no wheezes.  GI: Soft. Bowel sounds are normal. There is no tenderness.  Musculoskeletal: Normal range of motion.  Clotted left outer upper arm Hero Graft  Neurological: She is alert and oriented to person, place, and time.  Skin: Skin is warm and dry.  Psychiatric: She has a normal mood and affect. Her behavior is normal. Judgment and thought content normal.     Assessment/Plan ESRD Clotted left arm dialysis Hero graft  Scheduled for thrombolysis and possible angioplasty/stent placement Possible dialysis catheter placement Pt and guardian aware of procedure benefits and risks and agreeable to proceed Consent signed and in chart- via guardian over phone  Zephaniah Enyeart A 09/18/2012, 10:04 AM

## 2012-09-18 NOTE — ED Notes (Signed)
NO IV access- MD will give sedation meds during procedure intra-arterial

## 2012-09-18 NOTE — ED Notes (Signed)
Pt doing well, will let her have snack and family will be able to take her home. Pt was not sedated.

## 2012-09-20 DIAGNOSIS — D509 Iron deficiency anemia, unspecified: Secondary | ICD-10-CM | POA: Diagnosis not present

## 2012-09-20 DIAGNOSIS — N186 End stage renal disease: Secondary | ICD-10-CM | POA: Diagnosis not present

## 2012-09-20 DIAGNOSIS — N039 Chronic nephritic syndrome with unspecified morphologic changes: Secondary | ICD-10-CM | POA: Diagnosis not present

## 2012-09-20 DIAGNOSIS — N2581 Secondary hyperparathyroidism of renal origin: Secondary | ICD-10-CM | POA: Diagnosis not present

## 2012-09-20 DIAGNOSIS — D631 Anemia in chronic kidney disease: Secondary | ICD-10-CM | POA: Diagnosis not present

## 2012-09-21 NOTE — H&P (Signed)
Agree 

## 2012-09-22 DIAGNOSIS — E119 Type 2 diabetes mellitus without complications: Secondary | ICD-10-CM | POA: Diagnosis not present

## 2012-09-22 DIAGNOSIS — N2581 Secondary hyperparathyroidism of renal origin: Secondary | ICD-10-CM | POA: Diagnosis not present

## 2012-09-22 DIAGNOSIS — N039 Chronic nephritic syndrome with unspecified morphologic changes: Secondary | ICD-10-CM | POA: Diagnosis not present

## 2012-09-22 DIAGNOSIS — D631 Anemia in chronic kidney disease: Secondary | ICD-10-CM | POA: Diagnosis not present

## 2012-09-22 DIAGNOSIS — D509 Iron deficiency anemia, unspecified: Secondary | ICD-10-CM | POA: Diagnosis not present

## 2012-09-22 DIAGNOSIS — N186 End stage renal disease: Secondary | ICD-10-CM | POA: Diagnosis not present

## 2012-09-24 DIAGNOSIS — N186 End stage renal disease: Secondary | ICD-10-CM | POA: Diagnosis not present

## 2012-09-24 DIAGNOSIS — N2581 Secondary hyperparathyroidism of renal origin: Secondary | ICD-10-CM | POA: Diagnosis not present

## 2012-09-24 DIAGNOSIS — D509 Iron deficiency anemia, unspecified: Secondary | ICD-10-CM | POA: Diagnosis not present

## 2012-09-24 DIAGNOSIS — N039 Chronic nephritic syndrome with unspecified morphologic changes: Secondary | ICD-10-CM | POA: Diagnosis not present

## 2012-09-24 DIAGNOSIS — D631 Anemia in chronic kidney disease: Secondary | ICD-10-CM | POA: Diagnosis not present

## 2012-09-27 DIAGNOSIS — N039 Chronic nephritic syndrome with unspecified morphologic changes: Secondary | ICD-10-CM | POA: Diagnosis not present

## 2012-09-27 DIAGNOSIS — N186 End stage renal disease: Secondary | ICD-10-CM | POA: Diagnosis not present

## 2012-09-27 DIAGNOSIS — N2581 Secondary hyperparathyroidism of renal origin: Secondary | ICD-10-CM | POA: Diagnosis not present

## 2012-09-27 DIAGNOSIS — D509 Iron deficiency anemia, unspecified: Secondary | ICD-10-CM | POA: Diagnosis not present

## 2012-09-27 DIAGNOSIS — D631 Anemia in chronic kidney disease: Secondary | ICD-10-CM | POA: Diagnosis not present

## 2012-09-27 LAB — POCT I-STAT 4, (NA,K, GLUC, HGB,HCT)
HCT: 30 % — ABNORMAL LOW (ref 36.0–46.0)
Sodium: 140 mEq/L (ref 135–145)

## 2012-09-29 DIAGNOSIS — D509 Iron deficiency anemia, unspecified: Secondary | ICD-10-CM | POA: Diagnosis not present

## 2012-09-29 DIAGNOSIS — N186 End stage renal disease: Secondary | ICD-10-CM | POA: Diagnosis not present

## 2012-09-29 DIAGNOSIS — D631 Anemia in chronic kidney disease: Secondary | ICD-10-CM | POA: Diagnosis not present

## 2012-09-29 DIAGNOSIS — N2581 Secondary hyperparathyroidism of renal origin: Secondary | ICD-10-CM | POA: Diagnosis not present

## 2012-09-30 DIAGNOSIS — I871 Compression of vein: Secondary | ICD-10-CM | POA: Diagnosis not present

## 2012-09-30 DIAGNOSIS — N186 End stage renal disease: Secondary | ICD-10-CM | POA: Diagnosis not present

## 2012-09-30 DIAGNOSIS — T82898A Other specified complication of vascular prosthetic devices, implants and grafts, initial encounter: Secondary | ICD-10-CM | POA: Diagnosis not present

## 2012-10-01 DIAGNOSIS — D631 Anemia in chronic kidney disease: Secondary | ICD-10-CM | POA: Diagnosis not present

## 2012-10-01 DIAGNOSIS — N186 End stage renal disease: Secondary | ICD-10-CM | POA: Diagnosis not present

## 2012-10-01 DIAGNOSIS — D509 Iron deficiency anemia, unspecified: Secondary | ICD-10-CM | POA: Diagnosis not present

## 2012-10-01 DIAGNOSIS — N2581 Secondary hyperparathyroidism of renal origin: Secondary | ICD-10-CM | POA: Diagnosis not present

## 2012-10-04 DIAGNOSIS — D631 Anemia in chronic kidney disease: Secondary | ICD-10-CM | POA: Diagnosis not present

## 2012-10-04 DIAGNOSIS — N186 End stage renal disease: Secondary | ICD-10-CM | POA: Diagnosis not present

## 2012-10-04 DIAGNOSIS — N2581 Secondary hyperparathyroidism of renal origin: Secondary | ICD-10-CM | POA: Diagnosis not present

## 2012-10-04 DIAGNOSIS — D509 Iron deficiency anemia, unspecified: Secondary | ICD-10-CM | POA: Diagnosis not present

## 2012-10-06 DIAGNOSIS — D631 Anemia in chronic kidney disease: Secondary | ICD-10-CM | POA: Diagnosis not present

## 2012-10-06 DIAGNOSIS — D509 Iron deficiency anemia, unspecified: Secondary | ICD-10-CM | POA: Diagnosis not present

## 2012-10-06 DIAGNOSIS — N2581 Secondary hyperparathyroidism of renal origin: Secondary | ICD-10-CM | POA: Diagnosis not present

## 2012-10-06 DIAGNOSIS — N186 End stage renal disease: Secondary | ICD-10-CM | POA: Diagnosis not present

## 2012-10-06 DIAGNOSIS — N039 Chronic nephritic syndrome with unspecified morphologic changes: Secondary | ICD-10-CM | POA: Diagnosis not present

## 2012-10-08 DIAGNOSIS — D509 Iron deficiency anemia, unspecified: Secondary | ICD-10-CM | POA: Diagnosis not present

## 2012-10-08 DIAGNOSIS — D631 Anemia in chronic kidney disease: Secondary | ICD-10-CM | POA: Diagnosis not present

## 2012-10-08 DIAGNOSIS — N186 End stage renal disease: Secondary | ICD-10-CM | POA: Diagnosis not present

## 2012-10-08 DIAGNOSIS — N2581 Secondary hyperparathyroidism of renal origin: Secondary | ICD-10-CM | POA: Diagnosis not present

## 2012-10-11 DIAGNOSIS — D509 Iron deficiency anemia, unspecified: Secondary | ICD-10-CM | POA: Diagnosis not present

## 2012-10-11 DIAGNOSIS — N2581 Secondary hyperparathyroidism of renal origin: Secondary | ICD-10-CM | POA: Diagnosis not present

## 2012-10-11 DIAGNOSIS — D631 Anemia in chronic kidney disease: Secondary | ICD-10-CM | POA: Diagnosis not present

## 2012-10-11 DIAGNOSIS — N186 End stage renal disease: Secondary | ICD-10-CM | POA: Diagnosis not present

## 2012-10-13 DIAGNOSIS — N186 End stage renal disease: Secondary | ICD-10-CM | POA: Diagnosis not present

## 2012-10-14 DIAGNOSIS — N186 End stage renal disease: Secondary | ICD-10-CM | POA: Diagnosis not present

## 2012-10-14 DIAGNOSIS — T82898A Other specified complication of vascular prosthetic devices, implants and grafts, initial encounter: Secondary | ICD-10-CM | POA: Diagnosis not present

## 2012-10-14 DIAGNOSIS — I871 Compression of vein: Secondary | ICD-10-CM | POA: Diagnosis not present

## 2012-10-15 DIAGNOSIS — N186 End stage renal disease: Secondary | ICD-10-CM | POA: Diagnosis not present

## 2012-10-15 DIAGNOSIS — D509 Iron deficiency anemia, unspecified: Secondary | ICD-10-CM | POA: Diagnosis not present

## 2012-10-15 DIAGNOSIS — D631 Anemia in chronic kidney disease: Secondary | ICD-10-CM | POA: Diagnosis not present

## 2012-10-15 DIAGNOSIS — N039 Chronic nephritic syndrome with unspecified morphologic changes: Secondary | ICD-10-CM | POA: Diagnosis not present

## 2012-10-15 DIAGNOSIS — N2581 Secondary hyperparathyroidism of renal origin: Secondary | ICD-10-CM | POA: Diagnosis not present

## 2012-10-18 DIAGNOSIS — N186 End stage renal disease: Secondary | ICD-10-CM | POA: Diagnosis not present

## 2012-10-18 DIAGNOSIS — D631 Anemia in chronic kidney disease: Secondary | ICD-10-CM | POA: Diagnosis not present

## 2012-10-18 DIAGNOSIS — N2581 Secondary hyperparathyroidism of renal origin: Secondary | ICD-10-CM | POA: Diagnosis not present

## 2012-10-18 DIAGNOSIS — D509 Iron deficiency anemia, unspecified: Secondary | ICD-10-CM | POA: Diagnosis not present

## 2012-10-18 DIAGNOSIS — N039 Chronic nephritic syndrome with unspecified morphologic changes: Secondary | ICD-10-CM | POA: Diagnosis not present

## 2012-10-20 DIAGNOSIS — N186 End stage renal disease: Secondary | ICD-10-CM | POA: Diagnosis not present

## 2012-10-20 DIAGNOSIS — D509 Iron deficiency anemia, unspecified: Secondary | ICD-10-CM | POA: Diagnosis not present

## 2012-10-20 DIAGNOSIS — N2581 Secondary hyperparathyroidism of renal origin: Secondary | ICD-10-CM | POA: Diagnosis not present

## 2012-10-20 DIAGNOSIS — D631 Anemia in chronic kidney disease: Secondary | ICD-10-CM | POA: Diagnosis not present

## 2012-10-22 DIAGNOSIS — D509 Iron deficiency anemia, unspecified: Secondary | ICD-10-CM | POA: Diagnosis not present

## 2012-10-22 DIAGNOSIS — N039 Chronic nephritic syndrome with unspecified morphologic changes: Secondary | ICD-10-CM | POA: Diagnosis not present

## 2012-10-22 DIAGNOSIS — N2581 Secondary hyperparathyroidism of renal origin: Secondary | ICD-10-CM | POA: Diagnosis not present

## 2012-10-22 DIAGNOSIS — D631 Anemia in chronic kidney disease: Secondary | ICD-10-CM | POA: Diagnosis not present

## 2012-10-22 DIAGNOSIS — N186 End stage renal disease: Secondary | ICD-10-CM | POA: Diagnosis not present

## 2012-10-25 DIAGNOSIS — N186 End stage renal disease: Secondary | ICD-10-CM | POA: Diagnosis not present

## 2012-10-25 DIAGNOSIS — D631 Anemia in chronic kidney disease: Secondary | ICD-10-CM | POA: Diagnosis not present

## 2012-10-25 DIAGNOSIS — N2581 Secondary hyperparathyroidism of renal origin: Secondary | ICD-10-CM | POA: Diagnosis not present

## 2012-10-25 DIAGNOSIS — D509 Iron deficiency anemia, unspecified: Secondary | ICD-10-CM | POA: Diagnosis not present

## 2012-10-27 DIAGNOSIS — D509 Iron deficiency anemia, unspecified: Secondary | ICD-10-CM | POA: Diagnosis not present

## 2012-10-27 DIAGNOSIS — N039 Chronic nephritic syndrome with unspecified morphologic changes: Secondary | ICD-10-CM | POA: Diagnosis not present

## 2012-10-27 DIAGNOSIS — N2581 Secondary hyperparathyroidism of renal origin: Secondary | ICD-10-CM | POA: Diagnosis not present

## 2012-10-27 DIAGNOSIS — D631 Anemia in chronic kidney disease: Secondary | ICD-10-CM | POA: Diagnosis not present

## 2012-10-27 DIAGNOSIS — N186 End stage renal disease: Secondary | ICD-10-CM | POA: Diagnosis not present

## 2012-10-29 ENCOUNTER — Emergency Department (HOSPITAL_COMMUNITY)
Admission: EM | Admit: 2012-10-29 | Discharge: 2012-10-29 | Disposition: A | Discharge status: Home / Self Care | Payer: Medicare Other | Attending: Emergency Medicine | Admitting: Emergency Medicine

## 2012-10-29 ENCOUNTER — Encounter (HOSPITAL_COMMUNITY): Payer: Self-pay | Admitting: Emergency Medicine

## 2012-10-29 ENCOUNTER — Other Ambulatory Visit: Payer: Self-pay | Admitting: Radiology

## 2012-10-29 DIAGNOSIS — F17200 Nicotine dependence, unspecified, uncomplicated: Secondary | ICD-10-CM | POA: Insufficient documentation

## 2012-10-29 DIAGNOSIS — R0602 Shortness of breath: Secondary | ICD-10-CM | POA: Diagnosis not present

## 2012-10-29 DIAGNOSIS — N186 End stage renal disease: Secondary | ICD-10-CM | POA: Diagnosis not present

## 2012-10-29 DIAGNOSIS — Z992 Dependence on renal dialysis: Secondary | ICD-10-CM | POA: Insufficient documentation

## 2012-10-29 DIAGNOSIS — Z79899 Other long term (current) drug therapy: Secondary | ICD-10-CM | POA: Diagnosis not present

## 2012-10-29 DIAGNOSIS — Z8739 Personal history of other diseases of the musculoskeletal system and connective tissue: Secondary | ICD-10-CM | POA: Insufficient documentation

## 2012-10-29 DIAGNOSIS — Z87448 Personal history of other diseases of urinary system: Secondary | ICD-10-CM | POA: Diagnosis not present

## 2012-10-29 DIAGNOSIS — Z7982 Long term (current) use of aspirin: Secondary | ICD-10-CM | POA: Diagnosis not present

## 2012-10-29 DIAGNOSIS — T82868A Thrombosis of vascular prosthetic devices, implants and grafts, initial encounter: Secondary | ICD-10-CM

## 2012-10-29 DIAGNOSIS — T82598A Other mechanical complication of other cardiac and vascular devices and implants, initial encounter: Secondary | ICD-10-CM | POA: Diagnosis not present

## 2012-10-29 LAB — CBC WITH DIFFERENTIAL/PLATELET
Basophils Relative: 1 % (ref 0–1)
Eosinophils Absolute: 0.4 10*3/uL (ref 0.0–0.7)
HCT: 29.5 % — ABNORMAL LOW (ref 36.0–46.0)
Hemoglobin: 10.4 g/dL — ABNORMAL LOW (ref 12.0–15.0)
MCH: 30.1 pg (ref 26.0–34.0)
MCHC: 35.3 g/dL (ref 30.0–36.0)
Monocytes Absolute: 1 10*3/uL (ref 0.1–1.0)
Monocytes Relative: 10 % (ref 3–12)

## 2012-10-29 LAB — POCT I-STAT, CHEM 8
Calcium, Ion: 1.05 mmol/L — ABNORMAL LOW (ref 1.12–1.23)
Glucose, Bld: 69 mg/dL — ABNORMAL LOW (ref 70–99)
HCT: 31 % — ABNORMAL LOW (ref 36.0–46.0)
Hemoglobin: 10.5 g/dL — ABNORMAL LOW (ref 12.0–15.0)

## 2012-10-29 MED ORDER — SODIUM POLYSTYRENE SULFONATE 15 GM/60ML PO SUSP
30.0000 g | Freq: Once | ORAL | Status: AC
  Administered 2012-10-29: 30 g via ORAL
  Filled 2012-10-29: qty 120

## 2012-10-29 NOTE — ED Provider Notes (Signed)
CSN: [id number]     Arrival date & time 10/29/12  1425 History     First MD Initiated Contact with Patient 10/29/12 1436     No chief complaint on file.  (Consider location/radiation/quality/duration/timing/severity/associated sxs/prior Treatment) HPI Comments: Patient is a 37 year old female history of end-stage renal disease who presents today with a clotted Hero graft. Her graft clotted 6 times in the past 30 days per her guardian. She last had unclotted on 7/5 by Dr. Anselm Pancoast. She seens the Vascular Access clinic in [place], [place] with her last visit on 7/17. Currently she feels well with no new complaints. She is in no pain. She does have dialysis in [place] on Mondays, Wednesdays, and Fridays. She was unable to dialyzed today and last dialyzed on Wednesday.   The history is provided by the patient and a friend. No language interpreter was used.    Past Medical History  Diagnosis Date  . Renal disorder   . Lupus    Past Surgical History  Procedure Laterality Date  . Av fistula placement     No family history on file. History  Substance Use Topics  . Smoking status: Current Every Day Smoker    Types: Cigarettes  . Smokeless tobacco: Not on file  . Alcohol Use: Not on file   OB History   Grav Para Term Preterm Abortions TAB SAB Ect Mult Living                 Review of Systems  Constitutional: Negative for fever and chills.  Respiratory: Positive for shortness of breath (chronic).   Cardiovascular: Negative for chest pain.  Genitourinary:       Clotted graft.   All other systems reviewed and are negative.    Allergies  Review of patient's allergies indicates no known allergies.  Home Medications   Current Outpatient Rx  Name  Route  Sig  Dispense  Refill  . albuterol (PROVENTIL) (2.5 MG/3ML) 0.083% nebulizer solution   Nebulization   Take 2.5 mg by nebulization 4 (four) times daily as needed (breathing).         . ALPRAZolam (XANAX) 0.25 MG  tablet   Oral   Take 0.25 mg by mouth 2 (two) times daily.         Marland Kitchen aspirin EC 81 MG tablet   Oral   Take 81 mg by mouth daily.         . chlorhexidine (PERIDEX) 0.12 % solution   Mouth/Throat   Use as directed 15 mLs in the mouth or throat 2 (two) times daily. Swish for 1 minute and spit out.         . clopidogrel (PLAVIX) 75 MG tablet   Oral   Take 75 mg by mouth daily.         Marland Kitchen docusate sodium (COLACE) 100 MG capsule   Oral   Take 100 mg by mouth daily.         Marland Kitchen epoetin alfa (EPOGEN,PROCRIT) 16109 UNIT/ML injection   Subcutaneous   Inject 4,000 Units into the skin 3 (three) times a week. Tuesday, Wednesday, and saturday         . GuaiFENesin (TUSSIN PO)   Oral   Take 10 mLs by mouth every 6 (six) hours as needed (cough).         . hydroxychloroquine (PLAQUENIL) 200 MG tablet   Oral   Take 200 mg by mouth daily.         Marland Kitchen ipratropium (  ATROVENT) 0.02 % nebulizer solution   Nebulization   Take 250 mcg by nebulization 4 (four) times daily as needed (breathing).         Marland Kitchen loratadine (CLARITIN) 10 MG tablet   Oral   Take 10 mg by mouth daily as needed for allergies.         . metoprolol (LOPRESSOR) 50 MG tablet   Oral   Take 50 mg by mouth 2 (two) times daily.         . midodrine (PROAMATINE) 10 MG tablet   Oral   Take 10 mg by mouth 3 (three) times a week. Monday, Wednesday, and Friday before dialysis         . multivitamin (RENA-VIT) TABS tablet   Oral   Take 1 tablet by mouth daily.         . pantoprazole (PROTONIX) 40 MG tablet   Oral   Take 40 mg by mouth daily.         . phenytoin (DILANTIN) 100 MG ER capsule   Oral   Take 100 mg by mouth 2 (two) times daily.         . polyethylene glycol (MIRALAX / GLYCOLAX) packet   Oral   Take 17 g by mouth as needed (for constipation).         . sertraline (ZOLOFT) 100 MG tablet   Oral   Take 100 mg by mouth 2 (two) times daily.         . simvastatin (ZOCOR) 40 MG tablet    Oral   Take 40 mg by mouth daily.         . sodium polystyrene (KAYEXALATE) 15 GM/60ML suspension   Oral   Take 120 mLs (30 g total) by mouth once.   500 mL   0    BP 129/76  Pulse 91  Temp(Src) 97.9 F (36.6 C) (Oral)  SpO2 100% Physical Exam  Nursing note and vitals reviewed. Constitutional: She is oriented to person, place, and time. She appears well-developed and well-nourished. No distress.  HENT:  Head: Normocephalic and atraumatic.  Right Ear: External ear normal.  Left Ear: External ear normal.  Nose: Nose normal.  Mouth/Throat: Oropharynx is clear and moist.  Eyes: Conjunctivae are normal.  Neck: Normal range of motion.  Cardiovascular: Normal rate, regular rhythm and normal heart sounds.   Palpable, hard AV fistula in place in left arm. No surrounding erythema, drainage. No thrill.   Pulmonary/Chest: Effort normal and breath sounds normal. No stridor. No respiratory distress. She has no wheezes. She has no rales.  Abdominal: Soft. She exhibits no distension.  Musculoskeletal: Normal range of motion.  Neurological: She is alert and oriented to person, place, and time. She has normal strength.  Skin: Skin is warm and dry. She is not diaphoretic. No erythema.  Psychiatric: She has a normal mood and affect. Her behavior is normal.    ED Course   Procedures (including critical care time)  3:16 PM Spoke with Dr. Donnetta Hutching from Vascular Surgery who recommends interventional radiology. Her graft on 7/5 was amenable to treatment.   3:24 PM Spoke with Network engineer from [employer]. She will call me back after speaking to the radiologist.   3:49 PM Discussed case with Lavella Lemons who is a Marine scientist from [facility]. It appears the patient was sent from the dialysis center because Dr. Lowanda Foster wanted her dialyzed as soon as possible. She has an appointment with the [facility] on Monday. She needs  dialysis tomorrow.    3:58 PM Spoke with Dr. Lowanda Foster, the patient's  nephrologist. He reports he is unsure why she was sent to the emergency department, but would like her to be dialyzed tomorrow.    Labs Reviewed  CBC WITH DIFFERENTIAL - Abnormal; Notable for the following:    RBC 3.46 (*)    Hemoglobin 10.4 (*)    HCT 29.5 (*)    All other components within normal limits  POCT I-STAT, CHEM 8 - Abnormal; Notable for the following:    Potassium 5.6 (*)    BUN 27 (*)    Creatinine, Ser 3.70 (*)    Glucose, Bld 69 (*)    Calcium, Ion 1.05 (*)    Hemoglobin 10.5 (*)    HCT 31.0 (*)    All other components within normal limits   No results found. 1. End stage renal disease   2. Clotted renal dialysis AV graft, initial encounter     MDM  Patient presents with clotted Hero Graft. No other complaints. Electrolytes non emergent. Given Kayexalate for hyperkalemia.  After phone calls to dialysis center, IR, and her nephrologist the plan is for her to be de-clotted tomorrow at 8am and then go to dialysis. She has an appointment with the vascular access center in [place] on Monday. Vital signs stable for discharge. Return instructions given. Dr. Jeneen Rinks has evaluated the patient and agrees with plan. Patient / Family / Caregiver informed of clinical course, understand medical decision-making process, and agree with plan.   Elwyn Lade, PA-C 10/29/12 [phone]

## 2012-10-29 NOTE — ED Notes (Addendum)
Pt to ED for evaluation of clotted AVF.  Pt attempted dialysis today and was unsuccessful.  Pt has had AVF unclogged 3 weeks ago, states it has been clogged several times in the past.  (-) bruit/thrill.

## 2012-10-29 NOTE — ED Notes (Signed)
Family states dropped pt off to dialysis at 73 and she had clotted graft states 6  thimes in 30 days last dialysis was wed needs to have cath placed

## 2012-10-30 ENCOUNTER — Other Ambulatory Visit (HOSPITAL_COMMUNITY): Payer: Self-pay | Admitting: Nephrology

## 2012-10-30 ENCOUNTER — Ambulatory Visit (HOSPITAL_COMMUNITY)
Admission: EM | Admit: 2012-10-30 | Discharge: 2012-10-30 | Disposition: A | Discharge status: Home / Self Care | Payer: Medicare Other | Attending: Diagnostic Radiology | Admitting: Diagnostic Radiology

## 2012-10-30 ENCOUNTER — Other Ambulatory Visit: Payer: Self-pay | Admitting: Radiology

## 2012-10-30 ENCOUNTER — Ambulatory Visit (HOSPITAL_COMMUNITY)
Admission: RE | Admit: 2012-10-30 | Discharge: 2012-10-30 | Disposition: A | Discharge status: Home / Self Care | Payer: Medicare Other | Source: Ambulatory Visit | Attending: Nephrology | Admitting: Nephrology

## 2012-10-30 ENCOUNTER — Emergency Department (HOSPITAL_COMMUNITY)
Admission: EM | Admit: 2012-10-30 | Discharge: 2012-10-30 | Discharge status: Against Medical Advice | Payer: Medicare Other

## 2012-10-30 ENCOUNTER — Ambulatory Visit (HOSPITAL_COMMUNITY)
Admission: RE | Admit: 2012-10-30 | Discharge: 2012-10-30 | Disposition: A | Discharge status: Home / Self Care | Payer: Medicare Other | Source: Ambulatory Visit | Attending: Diagnostic Radiology | Admitting: Diagnostic Radiology

## 2012-10-30 ENCOUNTER — Encounter (HOSPITAL_COMMUNITY): Payer: Self-pay | Admitting: Radiology

## 2012-10-30 DIAGNOSIS — M329 Systemic lupus erythematosus, unspecified: Secondary | ICD-10-CM | POA: Insufficient documentation

## 2012-10-30 DIAGNOSIS — N186 End stage renal disease: Secondary | ICD-10-CM

## 2012-10-30 DIAGNOSIS — I749 Embolism and thrombosis of unspecified artery: Secondary | ICD-10-CM | POA: Insufficient documentation

## 2012-10-30 DIAGNOSIS — F70 Mild intellectual disabilities: Secondary | ICD-10-CM | POA: Diagnosis not present

## 2012-10-30 DIAGNOSIS — F17200 Nicotine dependence, unspecified, uncomplicated: Secondary | ICD-10-CM | POA: Insufficient documentation

## 2012-10-30 DIAGNOSIS — T82898A Other specified complication of vascular prosthetic devices, implants and grafts, initial encounter: Secondary | ICD-10-CM | POA: Diagnosis not present

## 2012-10-30 DIAGNOSIS — I871 Compression of vein: Secondary | ICD-10-CM | POA: Diagnosis not present

## 2012-10-30 DIAGNOSIS — Y929 Unspecified place or not applicable: Secondary | ICD-10-CM | POA: Insufficient documentation

## 2012-10-30 DIAGNOSIS — Y832 Surgical operation with anastomosis, bypass or graft as the cause of abnormal reaction of the patient, or of later complication, without mention of misadventure at the time of the procedure: Secondary | ICD-10-CM | POA: Insufficient documentation

## 2012-10-30 LAB — POCT I-STAT 4, (NA,K, GLUC, HGB,HCT): Glucose, Bld: 77 mg/dL (ref 70–99)

## 2012-10-30 MED ORDER — NEPRO/CARBSTEADY PO LIQD: 237.0000 mL | ORAL | Status: DC | PRN

## 2012-10-30 MED ORDER — LIDOCAINE-PRILOCAINE 2.5-2.5 % EX CREA: 1.0000 "application " | TOPICAL_CREAM | CUTANEOUS | Status: DC | PRN

## 2012-10-30 MED ORDER — LIDOCAINE HCL (PF) 1 % IJ SOLN: 5.0000 mL | INTRAMUSCULAR | Status: DC | PRN

## 2012-10-30 MED ORDER — PENTAFLUOROPROP-TETRAFLUOROETH EX AERO
1.0000 | INHALATION_SPRAY | CUTANEOUS | Status: DC | PRN
Start: 2012-10-30 — End: 2012-10-30

## 2012-10-30 MED ORDER — CEFAZOLIN SODIUM-DEXTROSE 2-3 GM-% IV SOLR: 2.0000 g | Freq: Once | INTRAVENOUS | Status: DC

## 2012-10-30 MED ORDER — OXYCODONE-ACETAMINOPHEN 5-325 MG PO TABS
2.0000 | ORAL_TABLET | Freq: Once | ORAL | Status: AC
  Administered 2012-10-30: 2 via ORAL

## 2012-10-30 MED ORDER — SODIUM CHLORIDE 0.9 % IV SOLN: 100.0000 mL | INTRAVENOUS | Status: DC | PRN

## 2012-10-30 MED ORDER — ALTEPLASE 2 MG IJ SOLR: 2.0000 mg | Freq: Once | INTRAMUSCULAR | Status: DC | PRN

## 2012-10-30 MED ORDER — MIDAZOLAM HCL 2 MG/2ML IJ SOLN
INTRAMUSCULAR | Status: AC
  Filled 2012-10-30: qty 4

## 2012-10-30 MED ORDER — IOHEXOL 300 MG/ML  SOLN
100.0000 mL | Freq: Once | INTRAMUSCULAR | Status: AC | PRN
  Administered 2012-10-30: 50 mL via INTRAVENOUS

## 2012-10-30 MED ORDER — OXYCODONE-ACETAMINOPHEN 5-325 MG PO TABS
ORAL_TABLET | ORAL | Status: AC
  Filled 2012-10-30: qty 2

## 2012-10-30 MED ORDER — HEPARIN SODIUM (PORCINE) 1000 UNIT/ML IJ SOLN
INTRAMUSCULAR | Status: AC | PRN
  Administered 2012-10-30: 3000 [IU] via INTRAVENOUS

## 2012-10-30 MED ORDER — HEPARIN SODIUM (PORCINE) 1000 UNIT/ML IJ SOLN
INTRAMUSCULAR | Status: AC
  Filled 2012-10-30: qty 1

## 2012-10-30 MED ORDER — FENTANYL CITRATE 0.05 MG/ML IJ SOLN
INTRAMUSCULAR | Status: AC
  Filled 2012-10-30: qty 4

## 2012-10-30 MED ORDER — ALTEPLASE 100 MG IV SOLR
2.0000 mg | Freq: Once | INTRAVENOUS | Status: AC
  Administered 2012-10-30: 2 mg
  Filled 2012-10-30: qty 2

## 2012-10-30 MED ORDER — SODIUM CHLORIDE 0.9 % IV SOLN: Freq: Once | INTRAVENOUS | Status: DC

## 2012-10-30 MED ORDER — HEPARIN SODIUM (PORCINE) 1000 UNIT/ML DIALYSIS: 1000.0000 [IU] | INTRAMUSCULAR | Status: DC | PRN

## 2012-10-30 NOTE — ED Notes (Signed)
No sedation has been given for this procedure

## 2012-10-30 NOTE — Progress Notes (Signed)
Pt tolerated hemodialysis tx well.  No problems with the AVG, sutures to left upper arm clean, dry, and intact.  Pt discharged to home with family post tx.  Instructed pt to return to dialysis on Monday for her regularly scheduled tx.

## 2012-10-30 NOTE — ED Provider Notes (Addendum)
Medical screening examination/treatment/procedure(s) were conducted as a shared visit with non-physician practitioner(s) and myself.  I personally evaluated the patient during the encounter  Pt examined.  Agree with evaluation and plan.   Lolita Patella, MD 10/30/12 [place], MD 11/04/12 [phone]

## 2012-10-30 NOTE — Procedures (Signed)
Successful declot of the left upper extremity HeRO graft.  No immediate complication.  Plan for dialysis at [facility]

## 2012-10-30 NOTE — H&P (Signed)
[name] is an 37 y.o. female.   Chief Complaint: Clotted Left arm Hero dialysis graft Last use 8/13- good flow Last attempt: 8/15: clotted Last declot here was 09/18/12- successful Pt sees MD in [place] for treatment of clotted graft also "declotted 6x in last month or so" per pt Scheduled now for L arm Hero dialysis graft thrombolysis and possible angioplasty/stent placement. Possible dialysis catheter placement if needed.  HPI: ESRD; Lupus  Past Medical History  Diagnosis Date  . Renal disorder   . Lupus     Past Surgical History  Procedure Laterality Date  . Av fistula placement      No family history on file. Social History:  reports that she has been smoking Cigarettes.  She has been smoking about 0.00 packs per day. She does not have any smokeless tobacco history on file. Her alcohol and drug histories are not on file.  Allergies: No Known Allergies   (Not in a hospital admission)  Results for orders placed during the hospital encounter of 10/29/12 (from the past 48 hour(s))  CBC WITH DIFFERENTIAL     Status: Abnormal   Collection Time    10/29/12  3:05 PM      Result Value Range   WBC 9.3  4.0 - 10.5 K/uL   RBC 3.46 (*) 3.87 - 5.11 MIL/uL   Hemoglobin 10.4 (*) 12.0 - 15.0 g/dL   HCT 29.5 (*) 36.0 - 46.0 %   MCV 85.3  78.0 - 100.0 fL   MCH 30.1  26.0 - 34.0 pg   MCHC 35.3  30.0 - 36.0 g/dL   RDW 13.8  11.5 - 15.5 %   Platelets 260  150 - 400 K/uL   Neutrophils Relative % 63  43 - 77 %   Neutro Abs 5.8  1.7 - 7.7 K/uL   Lymphocytes Relative 22  12 - 46 %   Lymphs Abs 2.0  0.7 - 4.0 K/uL   Monocytes Relative 10  3 - 12 %   Monocytes Absolute 1.0  0.1 - 1.0 K/uL   Eosinophils Relative 4  0 - 5 %   Eosinophils Absolute 0.4  0.0 - 0.7 K/uL   Basophils Relative 1  0 - 1 %   Basophils Absolute 0.1  0.0 - 0.1 K/uL  POCT I-STAT, CHEM 8     Status: Abnormal   Collection Time    10/29/12  3:30 PM      Result Value Range   Sodium 136  135 - 145 mEq/L   Potassium 5.6 (*) 3.5 - 5.1 mEq/L   Chloride 108  96 - 112 mEq/L   BUN 27 (*) 6 - 23 mg/dL   Creatinine, Ser 3.70 (*) 0.50 - 1.10 mg/dL   Glucose, Bld 69 (*) 70 - 99 mg/dL   Calcium, Ion 1.05 (*) 1.12 - 1.23 mmol/L   TCO2 19  0 - 100 mmol/L   Hemoglobin 10.5 (*) 12.0 - 15.0 g/dL   HCT 31.0 (*) 36.0 - 46.0 %   No results found.  Review of Systems  Constitutional: Negative for fever.  Respiratory: Negative for cough.   Cardiovascular: Negative for chest pain.  Gastrointestinal: Negative for vomiting, abdominal pain and diarrhea.  Neurological: Negative for headaches.    There were no vitals taken for this visit. Physical Exam  Constitutional: She is oriented to person, place, and time. She appears well-nourished.  Cardiovascular: Normal rate, regular rhythm and normal heart sounds.   No murmur heard. Respiratory:  Effort normal and breath sounds normal. She has no wheezes.  GI: Soft. Bowel sounds are normal. There is no tenderness.  Musculoskeletal: Normal range of motion.  Left arm Hero Graft clotted- no pulse/thrill  Neurological: She is alert and oriented to person, place, and time.  Mildly mentally handicapped  Skin: Skin is warm and dry.  Psychiatric: She has a normal mood and affect. Her behavior is normal.  Phone consent from guardian     Assessment/Plan Left arm Hero dialysis graft clotted Scheduled for thrombolysis and possible pta/stent placement. Possible dialysis catheter placement if needed pts guardian - [place] aware of procedure benefits and risks and agreeable to proceed Consent signed via phone and in chart  Lemoyne A 10/30/2012, 9:06 AM

## 2012-10-30 NOTE — Procedures (Addendum)
Pt referred to IR here for clotted HeRO access from [facility] HD unit.  Access declotted by IR physician, renal service asked to do IP dialysis since outpatient unit is not open.   Patient is calm in no distress, poor historian. Family member does not know her medical hx except for ESRD and lupus.  Usual HD is MWF, missed Fri Rx due to clotted access.   On exam pt is calm, simple, no distress.  No jvd, no edema, patent LUA AVG, heart sounds normal, no M, no abd tenderness. Chest is clear bilat.    Plan is for HD 3 hrs with 1kg UF, no heparin as we have no OP orders. Labs reviewed, K 4.8, Hb 10.  Plan D/C home after HD tonight.    Kelly Splinter  MD Pager [phone]    Cell  [phone] 10/30/2012, 4:54 PM

## 2012-11-01 DIAGNOSIS — N2581 Secondary hyperparathyroidism of renal origin: Secondary | ICD-10-CM | POA: Diagnosis not present

## 2012-11-01 DIAGNOSIS — D631 Anemia in chronic kidney disease: Secondary | ICD-10-CM | POA: Diagnosis not present

## 2012-11-01 DIAGNOSIS — D509 Iron deficiency anemia, unspecified: Secondary | ICD-10-CM | POA: Diagnosis not present

## 2012-11-01 DIAGNOSIS — N186 End stage renal disease: Secondary | ICD-10-CM | POA: Diagnosis not present

## 2012-11-01 LAB — POCT ACTIVATED CLOTTING TIME: Activated Clotting Time: 191 seconds

## 2012-11-03 DIAGNOSIS — D631 Anemia in chronic kidney disease: Secondary | ICD-10-CM | POA: Diagnosis not present

## 2012-11-03 DIAGNOSIS — N2581 Secondary hyperparathyroidism of renal origin: Secondary | ICD-10-CM | POA: Diagnosis not present

## 2012-11-03 DIAGNOSIS — D509 Iron deficiency anemia, unspecified: Secondary | ICD-10-CM | POA: Diagnosis not present

## 2012-11-03 DIAGNOSIS — N186 End stage renal disease: Secondary | ICD-10-CM | POA: Diagnosis not present

## 2012-11-05 DIAGNOSIS — N2581 Secondary hyperparathyroidism of renal origin: Secondary | ICD-10-CM | POA: Diagnosis not present

## 2012-11-05 DIAGNOSIS — D509 Iron deficiency anemia, unspecified: Secondary | ICD-10-CM | POA: Diagnosis not present

## 2012-11-05 DIAGNOSIS — D631 Anemia in chronic kidney disease: Secondary | ICD-10-CM | POA: Diagnosis not present

## 2012-11-05 DIAGNOSIS — N186 End stage renal disease: Secondary | ICD-10-CM | POA: Diagnosis not present

## 2012-11-09 DIAGNOSIS — N2581 Secondary hyperparathyroidism of renal origin: Secondary | ICD-10-CM | POA: Diagnosis not present

## 2012-11-09 DIAGNOSIS — D631 Anemia in chronic kidney disease: Secondary | ICD-10-CM | POA: Diagnosis not present

## 2012-11-09 DIAGNOSIS — N186 End stage renal disease: Secondary | ICD-10-CM | POA: Diagnosis not present

## 2012-11-09 DIAGNOSIS — T82898A Other specified complication of vascular prosthetic devices, implants and grafts, initial encounter: Secondary | ICD-10-CM | POA: Diagnosis not present

## 2012-11-09 DIAGNOSIS — D509 Iron deficiency anemia, unspecified: Secondary | ICD-10-CM | POA: Diagnosis not present

## 2012-11-09 DIAGNOSIS — I871 Compression of vein: Secondary | ICD-10-CM | POA: Diagnosis not present

## 2012-11-10 DIAGNOSIS — D509 Iron deficiency anemia, unspecified: Secondary | ICD-10-CM | POA: Diagnosis not present

## 2012-11-10 DIAGNOSIS — N186 End stage renal disease: Secondary | ICD-10-CM | POA: Diagnosis not present

## 2012-11-10 DIAGNOSIS — D631 Anemia in chronic kidney disease: Secondary | ICD-10-CM | POA: Diagnosis not present

## 2012-11-10 DIAGNOSIS — N2581 Secondary hyperparathyroidism of renal origin: Secondary | ICD-10-CM | POA: Diagnosis not present

## 2012-11-12 DIAGNOSIS — D631 Anemia in chronic kidney disease: Secondary | ICD-10-CM | POA: Diagnosis not present

## 2012-11-12 DIAGNOSIS — D509 Iron deficiency anemia, unspecified: Secondary | ICD-10-CM | POA: Diagnosis not present

## 2012-11-12 DIAGNOSIS — N186 End stage renal disease: Secondary | ICD-10-CM | POA: Diagnosis not present

## 2012-11-12 DIAGNOSIS — N2581 Secondary hyperparathyroidism of renal origin: Secondary | ICD-10-CM | POA: Diagnosis not present

## 2012-11-14 DIAGNOSIS — N186 End stage renal disease: Secondary | ICD-10-CM | POA: Diagnosis not present

## 2012-11-15 DIAGNOSIS — N186 End stage renal disease: Secondary | ICD-10-CM | POA: Diagnosis not present

## 2012-11-15 DIAGNOSIS — N2581 Secondary hyperparathyroidism of renal origin: Secondary | ICD-10-CM | POA: Diagnosis not present

## 2012-11-15 DIAGNOSIS — D509 Iron deficiency anemia, unspecified: Secondary | ICD-10-CM | POA: Diagnosis not present

## 2012-11-15 DIAGNOSIS — D631 Anemia in chronic kidney disease: Secondary | ICD-10-CM | POA: Diagnosis not present

## 2012-11-17 DIAGNOSIS — N186 End stage renal disease: Secondary | ICD-10-CM | POA: Diagnosis not present

## 2012-11-17 DIAGNOSIS — D631 Anemia in chronic kidney disease: Secondary | ICD-10-CM | POA: Diagnosis not present

## 2012-11-17 DIAGNOSIS — N2581 Secondary hyperparathyroidism of renal origin: Secondary | ICD-10-CM | POA: Diagnosis not present

## 2012-11-17 DIAGNOSIS — D509 Iron deficiency anemia, unspecified: Secondary | ICD-10-CM | POA: Diagnosis not present

## 2012-11-19 DIAGNOSIS — D631 Anemia in chronic kidney disease: Secondary | ICD-10-CM | POA: Diagnosis not present

## 2012-11-19 DIAGNOSIS — D509 Iron deficiency anemia, unspecified: Secondary | ICD-10-CM | POA: Diagnosis not present

## 2012-11-19 DIAGNOSIS — N2581 Secondary hyperparathyroidism of renal origin: Secondary | ICD-10-CM | POA: Diagnosis not present

## 2012-11-19 DIAGNOSIS — N186 End stage renal disease: Secondary | ICD-10-CM | POA: Diagnosis not present

## 2012-11-22 DIAGNOSIS — D631 Anemia in chronic kidney disease: Secondary | ICD-10-CM | POA: Diagnosis not present

## 2012-11-22 DIAGNOSIS — N186 End stage renal disease: Secondary | ICD-10-CM | POA: Diagnosis not present

## 2012-11-22 DIAGNOSIS — N2581 Secondary hyperparathyroidism of renal origin: Secondary | ICD-10-CM | POA: Diagnosis not present

## 2012-11-22 DIAGNOSIS — D509 Iron deficiency anemia, unspecified: Secondary | ICD-10-CM | POA: Diagnosis not present

## 2012-11-24 DIAGNOSIS — N2581 Secondary hyperparathyroidism of renal origin: Secondary | ICD-10-CM | POA: Diagnosis not present

## 2012-11-24 DIAGNOSIS — N186 End stage renal disease: Secondary | ICD-10-CM | POA: Diagnosis not present

## 2012-11-24 DIAGNOSIS — D631 Anemia in chronic kidney disease: Secondary | ICD-10-CM | POA: Diagnosis not present

## 2012-11-24 DIAGNOSIS — D509 Iron deficiency anemia, unspecified: Secondary | ICD-10-CM | POA: Diagnosis not present

## 2012-11-26 DIAGNOSIS — N186 End stage renal disease: Secondary | ICD-10-CM | POA: Diagnosis not present

## 2012-11-26 DIAGNOSIS — N2581 Secondary hyperparathyroidism of renal origin: Secondary | ICD-10-CM | POA: Diagnosis not present

## 2012-11-26 DIAGNOSIS — D631 Anemia in chronic kidney disease: Secondary | ICD-10-CM | POA: Diagnosis not present

## 2012-11-26 DIAGNOSIS — D509 Iron deficiency anemia, unspecified: Secondary | ICD-10-CM | POA: Diagnosis not present

## 2012-11-29 DIAGNOSIS — D509 Iron deficiency anemia, unspecified: Secondary | ICD-10-CM | POA: Diagnosis not present

## 2012-11-29 DIAGNOSIS — N2581 Secondary hyperparathyroidism of renal origin: Secondary | ICD-10-CM | POA: Diagnosis not present

## 2012-11-29 DIAGNOSIS — N186 End stage renal disease: Secondary | ICD-10-CM | POA: Diagnosis not present

## 2012-11-29 DIAGNOSIS — D631 Anemia in chronic kidney disease: Secondary | ICD-10-CM | POA: Diagnosis not present

## 2012-12-01 DIAGNOSIS — N2581 Secondary hyperparathyroidism of renal origin: Secondary | ICD-10-CM | POA: Diagnosis not present

## 2012-12-01 DIAGNOSIS — D509 Iron deficiency anemia, unspecified: Secondary | ICD-10-CM | POA: Diagnosis not present

## 2012-12-01 DIAGNOSIS — N186 End stage renal disease: Secondary | ICD-10-CM | POA: Diagnosis not present

## 2012-12-01 DIAGNOSIS — D631 Anemia in chronic kidney disease: Secondary | ICD-10-CM | POA: Diagnosis not present

## 2012-12-03 DIAGNOSIS — N186 End stage renal disease: Secondary | ICD-10-CM | POA: Diagnosis not present

## 2012-12-03 DIAGNOSIS — D509 Iron deficiency anemia, unspecified: Secondary | ICD-10-CM | POA: Diagnosis not present

## 2012-12-03 DIAGNOSIS — D631 Anemia in chronic kidney disease: Secondary | ICD-10-CM | POA: Diagnosis not present

## 2012-12-03 DIAGNOSIS — N2581 Secondary hyperparathyroidism of renal origin: Secondary | ICD-10-CM | POA: Diagnosis not present

## 2012-12-06 DIAGNOSIS — D631 Anemia in chronic kidney disease: Secondary | ICD-10-CM | POA: Diagnosis not present

## 2012-12-06 DIAGNOSIS — N186 End stage renal disease: Secondary | ICD-10-CM | POA: Diagnosis not present

## 2012-12-06 DIAGNOSIS — N2581 Secondary hyperparathyroidism of renal origin: Secondary | ICD-10-CM | POA: Diagnosis not present

## 2012-12-06 DIAGNOSIS — D509 Iron deficiency anemia, unspecified: Secondary | ICD-10-CM | POA: Diagnosis not present

## 2012-12-07 DIAGNOSIS — T82898A Other specified complication of vascular prosthetic devices, implants and grafts, initial encounter: Secondary | ICD-10-CM | POA: Diagnosis not present

## 2012-12-07 DIAGNOSIS — I871 Compression of vein: Secondary | ICD-10-CM | POA: Diagnosis not present

## 2012-12-07 DIAGNOSIS — N186 End stage renal disease: Secondary | ICD-10-CM | POA: Diagnosis not present

## 2012-12-08 DIAGNOSIS — D631 Anemia in chronic kidney disease: Secondary | ICD-10-CM | POA: Diagnosis not present

## 2012-12-08 DIAGNOSIS — N186 End stage renal disease: Secondary | ICD-10-CM | POA: Diagnosis not present

## 2012-12-08 DIAGNOSIS — N2581 Secondary hyperparathyroidism of renal origin: Secondary | ICD-10-CM | POA: Diagnosis not present

## 2012-12-08 DIAGNOSIS — D509 Iron deficiency anemia, unspecified: Secondary | ICD-10-CM | POA: Diagnosis not present

## 2012-12-10 DIAGNOSIS — D509 Iron deficiency anemia, unspecified: Secondary | ICD-10-CM | POA: Diagnosis not present

## 2012-12-10 DIAGNOSIS — N2581 Secondary hyperparathyroidism of renal origin: Secondary | ICD-10-CM | POA: Diagnosis not present

## 2012-12-10 DIAGNOSIS — D631 Anemia in chronic kidney disease: Secondary | ICD-10-CM | POA: Diagnosis not present

## 2012-12-10 DIAGNOSIS — N186 End stage renal disease: Secondary | ICD-10-CM | POA: Diagnosis not present

## 2012-12-13 DIAGNOSIS — N186 End stage renal disease: Secondary | ICD-10-CM | POA: Diagnosis not present

## 2012-12-13 DIAGNOSIS — N2581 Secondary hyperparathyroidism of renal origin: Secondary | ICD-10-CM | POA: Diagnosis not present

## 2012-12-13 DIAGNOSIS — D631 Anemia in chronic kidney disease: Secondary | ICD-10-CM | POA: Diagnosis not present

## 2012-12-13 DIAGNOSIS — D509 Iron deficiency anemia, unspecified: Secondary | ICD-10-CM | POA: Diagnosis not present

## 2012-12-14 DIAGNOSIS — N186 End stage renal disease: Secondary | ICD-10-CM | POA: Diagnosis not present

## 2012-12-16 DIAGNOSIS — T82898A Other specified complication of vascular prosthetic devices, implants and grafts, initial encounter: Secondary | ICD-10-CM | POA: Diagnosis not present

## 2012-12-16 DIAGNOSIS — I871 Compression of vein: Secondary | ICD-10-CM | POA: Diagnosis not present

## 2012-12-16 DIAGNOSIS — N186 End stage renal disease: Secondary | ICD-10-CM | POA: Diagnosis not present

## 2012-12-17 DIAGNOSIS — D631 Anemia in chronic kidney disease: Secondary | ICD-10-CM | POA: Diagnosis not present

## 2012-12-17 DIAGNOSIS — D509 Iron deficiency anemia, unspecified: Secondary | ICD-10-CM | POA: Diagnosis not present

## 2012-12-17 DIAGNOSIS — Z23 Encounter for immunization: Secondary | ICD-10-CM | POA: Diagnosis not present

## 2012-12-17 DIAGNOSIS — N2581 Secondary hyperparathyroidism of renal origin: Secondary | ICD-10-CM | POA: Diagnosis not present

## 2012-12-17 DIAGNOSIS — N186 End stage renal disease: Secondary | ICD-10-CM | POA: Diagnosis not present

## 2012-12-17 DIAGNOSIS — Z992 Dependence on renal dialysis: Secondary | ICD-10-CM | POA: Diagnosis not present

## 2012-12-20 DIAGNOSIS — D631 Anemia in chronic kidney disease: Secondary | ICD-10-CM | POA: Diagnosis not present

## 2012-12-20 DIAGNOSIS — Z992 Dependence on renal dialysis: Secondary | ICD-10-CM | POA: Diagnosis not present

## 2012-12-20 DIAGNOSIS — Z23 Encounter for immunization: Secondary | ICD-10-CM | POA: Diagnosis not present

## 2012-12-20 DIAGNOSIS — N186 End stage renal disease: Secondary | ICD-10-CM | POA: Diagnosis not present

## 2012-12-20 DIAGNOSIS — D509 Iron deficiency anemia, unspecified: Secondary | ICD-10-CM | POA: Diagnosis not present

## 2012-12-20 DIAGNOSIS — N2581 Secondary hyperparathyroidism of renal origin: Secondary | ICD-10-CM | POA: Diagnosis not present

## 2012-12-22 DIAGNOSIS — N186 End stage renal disease: Secondary | ICD-10-CM | POA: Diagnosis not present

## 2012-12-22 DIAGNOSIS — N2581 Secondary hyperparathyroidism of renal origin: Secondary | ICD-10-CM | POA: Diagnosis not present

## 2012-12-22 DIAGNOSIS — Z992 Dependence on renal dialysis: Secondary | ICD-10-CM | POA: Diagnosis not present

## 2012-12-22 DIAGNOSIS — Z23 Encounter for immunization: Secondary | ICD-10-CM | POA: Diagnosis not present

## 2012-12-22 DIAGNOSIS — D631 Anemia in chronic kidney disease: Secondary | ICD-10-CM | POA: Diagnosis not present

## 2012-12-22 DIAGNOSIS — D509 Iron deficiency anemia, unspecified: Secondary | ICD-10-CM | POA: Diagnosis not present

## 2012-12-22 DIAGNOSIS — E119 Type 2 diabetes mellitus without complications: Secondary | ICD-10-CM | POA: Diagnosis not present

## 2012-12-24 DIAGNOSIS — N2581 Secondary hyperparathyroidism of renal origin: Secondary | ICD-10-CM | POA: Diagnosis not present

## 2012-12-24 DIAGNOSIS — N186 End stage renal disease: Secondary | ICD-10-CM | POA: Diagnosis not present

## 2012-12-24 DIAGNOSIS — D631 Anemia in chronic kidney disease: Secondary | ICD-10-CM | POA: Diagnosis not present

## 2012-12-24 DIAGNOSIS — Z992 Dependence on renal dialysis: Secondary | ICD-10-CM | POA: Diagnosis not present

## 2012-12-24 DIAGNOSIS — D509 Iron deficiency anemia, unspecified: Secondary | ICD-10-CM | POA: Diagnosis not present

## 2012-12-24 DIAGNOSIS — Z23 Encounter for immunization: Secondary | ICD-10-CM | POA: Diagnosis not present

## 2012-12-27 DIAGNOSIS — Z23 Encounter for immunization: Secondary | ICD-10-CM | POA: Diagnosis not present

## 2012-12-27 DIAGNOSIS — N2581 Secondary hyperparathyroidism of renal origin: Secondary | ICD-10-CM | POA: Diagnosis not present

## 2012-12-27 DIAGNOSIS — N186 End stage renal disease: Secondary | ICD-10-CM | POA: Diagnosis not present

## 2012-12-27 DIAGNOSIS — Z992 Dependence on renal dialysis: Secondary | ICD-10-CM | POA: Diagnosis not present

## 2012-12-27 DIAGNOSIS — D631 Anemia in chronic kidney disease: Secondary | ICD-10-CM | POA: Diagnosis not present

## 2012-12-27 DIAGNOSIS — D509 Iron deficiency anemia, unspecified: Secondary | ICD-10-CM | POA: Diagnosis not present

## 2012-12-30 DIAGNOSIS — I871 Compression of vein: Secondary | ICD-10-CM | POA: Diagnosis not present

## 2012-12-30 DIAGNOSIS — N186 End stage renal disease: Secondary | ICD-10-CM | POA: Diagnosis not present

## 2012-12-30 DIAGNOSIS — T82898A Other specified complication of vascular prosthetic devices, implants and grafts, initial encounter: Secondary | ICD-10-CM | POA: Diagnosis not present

## 2012-12-31 DIAGNOSIS — N2581 Secondary hyperparathyroidism of renal origin: Secondary | ICD-10-CM | POA: Diagnosis not present

## 2012-12-31 DIAGNOSIS — Z992 Dependence on renal dialysis: Secondary | ICD-10-CM | POA: Diagnosis not present

## 2012-12-31 DIAGNOSIS — N186 End stage renal disease: Secondary | ICD-10-CM | POA: Diagnosis not present

## 2012-12-31 DIAGNOSIS — Z23 Encounter for immunization: Secondary | ICD-10-CM | POA: Diagnosis not present

## 2012-12-31 DIAGNOSIS — D631 Anemia in chronic kidney disease: Secondary | ICD-10-CM | POA: Diagnosis not present

## 2012-12-31 DIAGNOSIS — D509 Iron deficiency anemia, unspecified: Secondary | ICD-10-CM | POA: Diagnosis not present

## 2013-01-04 DIAGNOSIS — N186 End stage renal disease: Secondary | ICD-10-CM | POA: Diagnosis not present

## 2013-01-04 DIAGNOSIS — T82898A Other specified complication of vascular prosthetic devices, implants and grafts, initial encounter: Secondary | ICD-10-CM | POA: Diagnosis not present

## 2013-01-04 DIAGNOSIS — I871 Compression of vein: Secondary | ICD-10-CM | POA: Diagnosis not present

## 2013-01-05 DIAGNOSIS — Z992 Dependence on renal dialysis: Secondary | ICD-10-CM | POA: Diagnosis not present

## 2013-01-05 DIAGNOSIS — Z23 Encounter for immunization: Secondary | ICD-10-CM | POA: Diagnosis not present

## 2013-01-05 DIAGNOSIS — D631 Anemia in chronic kidney disease: Secondary | ICD-10-CM | POA: Diagnosis not present

## 2013-01-05 DIAGNOSIS — N2581 Secondary hyperparathyroidism of renal origin: Secondary | ICD-10-CM | POA: Diagnosis not present

## 2013-01-05 DIAGNOSIS — N186 End stage renal disease: Secondary | ICD-10-CM | POA: Diagnosis not present

## 2013-01-05 DIAGNOSIS — D509 Iron deficiency anemia, unspecified: Secondary | ICD-10-CM | POA: Diagnosis not present

## 2013-01-06 DIAGNOSIS — I10 Essential (primary) hypertension: Secondary | ICD-10-CM | POA: Diagnosis not present

## 2013-01-06 DIAGNOSIS — F17200 Nicotine dependence, unspecified, uncomplicated: Secondary | ICD-10-CM | POA: Diagnosis not present

## 2013-01-06 DIAGNOSIS — N186 End stage renal disease: Secondary | ICD-10-CM | POA: Diagnosis not present

## 2013-01-06 DIAGNOSIS — T82898A Other specified complication of vascular prosthetic devices, implants and grafts, initial encounter: Secondary | ICD-10-CM | POA: Diagnosis not present

## 2013-01-07 DIAGNOSIS — Z23 Encounter for immunization: Secondary | ICD-10-CM | POA: Diagnosis not present

## 2013-01-07 DIAGNOSIS — N2581 Secondary hyperparathyroidism of renal origin: Secondary | ICD-10-CM | POA: Diagnosis not present

## 2013-01-07 DIAGNOSIS — D509 Iron deficiency anemia, unspecified: Secondary | ICD-10-CM | POA: Diagnosis not present

## 2013-01-07 DIAGNOSIS — N186 End stage renal disease: Secondary | ICD-10-CM | POA: Diagnosis not present

## 2013-01-07 DIAGNOSIS — Z992 Dependence on renal dialysis: Secondary | ICD-10-CM | POA: Diagnosis not present

## 2013-01-07 DIAGNOSIS — D631 Anemia in chronic kidney disease: Secondary | ICD-10-CM | POA: Diagnosis not present

## 2013-01-10 DIAGNOSIS — D509 Iron deficiency anemia, unspecified: Secondary | ICD-10-CM | POA: Diagnosis not present

## 2013-01-10 DIAGNOSIS — Z23 Encounter for immunization: Secondary | ICD-10-CM | POA: Diagnosis not present

## 2013-01-10 DIAGNOSIS — Z992 Dependence on renal dialysis: Secondary | ICD-10-CM | POA: Diagnosis not present

## 2013-01-10 DIAGNOSIS — D631 Anemia in chronic kidney disease: Secondary | ICD-10-CM | POA: Diagnosis not present

## 2013-01-10 DIAGNOSIS — N2581 Secondary hyperparathyroidism of renal origin: Secondary | ICD-10-CM | POA: Diagnosis not present

## 2013-01-10 DIAGNOSIS — N186 End stage renal disease: Secondary | ICD-10-CM | POA: Diagnosis not present

## 2013-01-11 DIAGNOSIS — I10 Essential (primary) hypertension: Secondary | ICD-10-CM | POA: Diagnosis not present

## 2013-01-11 DIAGNOSIS — T82898A Other specified complication of vascular prosthetic devices, implants and grafts, initial encounter: Secondary | ICD-10-CM | POA: Diagnosis not present

## 2013-01-11 DIAGNOSIS — N186 End stage renal disease: Secondary | ICD-10-CM | POA: Diagnosis not present

## 2013-01-12 DIAGNOSIS — N186 End stage renal disease: Secondary | ICD-10-CM | POA: Diagnosis not present

## 2013-01-12 DIAGNOSIS — D509 Iron deficiency anemia, unspecified: Secondary | ICD-10-CM | POA: Diagnosis not present

## 2013-01-12 DIAGNOSIS — N2581 Secondary hyperparathyroidism of renal origin: Secondary | ICD-10-CM | POA: Diagnosis not present

## 2013-01-12 DIAGNOSIS — D631 Anemia in chronic kidney disease: Secondary | ICD-10-CM | POA: Diagnosis not present

## 2013-01-12 DIAGNOSIS — Z992 Dependence on renal dialysis: Secondary | ICD-10-CM | POA: Diagnosis not present

## 2013-01-12 DIAGNOSIS — Z23 Encounter for immunization: Secondary | ICD-10-CM | POA: Diagnosis not present

## 2013-01-13 ENCOUNTER — Ambulatory Visit: Payer: Self-pay | Admitting: Vascular Surgery

## 2013-01-13 DIAGNOSIS — Z7982 Long term (current) use of aspirin: Secondary | ICD-10-CM | POA: Diagnosis not present

## 2013-01-13 DIAGNOSIS — Z9889 Other specified postprocedural states: Secondary | ICD-10-CM | POA: Diagnosis not present

## 2013-01-13 DIAGNOSIS — F17200 Nicotine dependence, unspecified, uncomplicated: Secondary | ICD-10-CM | POA: Diagnosis not present

## 2013-01-13 DIAGNOSIS — Z79899 Other long term (current) drug therapy: Secondary | ICD-10-CM | POA: Diagnosis not present

## 2013-01-13 DIAGNOSIS — Z992 Dependence on renal dialysis: Secondary | ICD-10-CM | POA: Diagnosis not present

## 2013-01-13 DIAGNOSIS — T82898A Other specified complication of vascular prosthetic devices, implants and grafts, initial encounter: Secondary | ICD-10-CM | POA: Diagnosis not present

## 2013-01-13 DIAGNOSIS — I120 Hypertensive chronic kidney disease with stage 5 chronic kidney disease or end stage renal disease: Secondary | ICD-10-CM | POA: Diagnosis not present

## 2013-01-13 DIAGNOSIS — N186 End stage renal disease: Secondary | ICD-10-CM | POA: Diagnosis not present

## 2013-01-13 DIAGNOSIS — Z86718 Personal history of other venous thrombosis and embolism: Secondary | ICD-10-CM | POA: Diagnosis not present

## 2013-01-13 LAB — HCG, QUANTITATIVE, PREGNANCY: Beta Hcg, Quant.: <1 m[IU]/mL — ABNORMAL LOW

## 2013-01-14 DIAGNOSIS — N186 End stage renal disease: Secondary | ICD-10-CM | POA: Diagnosis not present

## 2013-01-14 DIAGNOSIS — Z23 Encounter for immunization: Secondary | ICD-10-CM | POA: Diagnosis not present

## 2013-01-14 DIAGNOSIS — D631 Anemia in chronic kidney disease: Secondary | ICD-10-CM | POA: Diagnosis not present

## 2013-01-14 DIAGNOSIS — Z992 Dependence on renal dialysis: Secondary | ICD-10-CM | POA: Diagnosis not present

## 2013-01-14 DIAGNOSIS — N2581 Secondary hyperparathyroidism of renal origin: Secondary | ICD-10-CM | POA: Diagnosis not present

## 2013-01-14 DIAGNOSIS — D509 Iron deficiency anemia, unspecified: Secondary | ICD-10-CM | POA: Diagnosis not present

## 2013-01-17 DIAGNOSIS — D631 Anemia in chronic kidney disease: Secondary | ICD-10-CM | POA: Diagnosis not present

## 2013-01-17 DIAGNOSIS — N186 End stage renal disease: Secondary | ICD-10-CM | POA: Diagnosis not present

## 2013-01-17 DIAGNOSIS — D509 Iron deficiency anemia, unspecified: Secondary | ICD-10-CM | POA: Diagnosis not present

## 2013-01-17 DIAGNOSIS — N2581 Secondary hyperparathyroidism of renal origin: Secondary | ICD-10-CM | POA: Diagnosis not present

## 2013-01-19 DIAGNOSIS — D509 Iron deficiency anemia, unspecified: Secondary | ICD-10-CM | POA: Diagnosis not present

## 2013-01-19 DIAGNOSIS — N2581 Secondary hyperparathyroidism of renal origin: Secondary | ICD-10-CM | POA: Diagnosis not present

## 2013-01-19 DIAGNOSIS — N186 End stage renal disease: Secondary | ICD-10-CM | POA: Diagnosis not present

## 2013-01-19 DIAGNOSIS — D631 Anemia in chronic kidney disease: Secondary | ICD-10-CM | POA: Diagnosis not present

## 2013-01-21 DIAGNOSIS — D509 Iron deficiency anemia, unspecified: Secondary | ICD-10-CM | POA: Diagnosis not present

## 2013-01-21 DIAGNOSIS — N186 End stage renal disease: Secondary | ICD-10-CM | POA: Diagnosis not present

## 2013-01-21 DIAGNOSIS — D631 Anemia in chronic kidney disease: Secondary | ICD-10-CM | POA: Diagnosis not present

## 2013-01-21 DIAGNOSIS — N2581 Secondary hyperparathyroidism of renal origin: Secondary | ICD-10-CM | POA: Diagnosis not present

## 2013-01-24 DIAGNOSIS — D509 Iron deficiency anemia, unspecified: Secondary | ICD-10-CM | POA: Diagnosis not present

## 2013-01-24 DIAGNOSIS — D631 Anemia in chronic kidney disease: Secondary | ICD-10-CM | POA: Diagnosis not present

## 2013-01-24 DIAGNOSIS — N2581 Secondary hyperparathyroidism of renal origin: Secondary | ICD-10-CM | POA: Diagnosis not present

## 2013-01-24 DIAGNOSIS — N186 End stage renal disease: Secondary | ICD-10-CM | POA: Diagnosis not present

## 2013-01-26 DIAGNOSIS — D509 Iron deficiency anemia, unspecified: Secondary | ICD-10-CM | POA: Diagnosis not present

## 2013-01-26 DIAGNOSIS — D631 Anemia in chronic kidney disease: Secondary | ICD-10-CM | POA: Diagnosis not present

## 2013-01-26 DIAGNOSIS — N2581 Secondary hyperparathyroidism of renal origin: Secondary | ICD-10-CM | POA: Diagnosis not present

## 2013-01-26 DIAGNOSIS — N186 End stage renal disease: Secondary | ICD-10-CM | POA: Diagnosis not present

## 2013-01-28 DIAGNOSIS — N186 End stage renal disease: Secondary | ICD-10-CM | POA: Diagnosis not present

## 2013-01-28 DIAGNOSIS — D631 Anemia in chronic kidney disease: Secondary | ICD-10-CM | POA: Diagnosis not present

## 2013-01-28 DIAGNOSIS — D509 Iron deficiency anemia, unspecified: Secondary | ICD-10-CM | POA: Diagnosis not present

## 2013-01-28 DIAGNOSIS — N2581 Secondary hyperparathyroidism of renal origin: Secondary | ICD-10-CM | POA: Diagnosis not present

## 2013-01-31 DIAGNOSIS — N186 End stage renal disease: Secondary | ICD-10-CM | POA: Diagnosis not present

## 2013-01-31 DIAGNOSIS — N2581 Secondary hyperparathyroidism of renal origin: Secondary | ICD-10-CM | POA: Diagnosis not present

## 2013-01-31 DIAGNOSIS — D509 Iron deficiency anemia, unspecified: Secondary | ICD-10-CM | POA: Diagnosis not present

## 2013-01-31 DIAGNOSIS — D631 Anemia in chronic kidney disease: Secondary | ICD-10-CM | POA: Diagnosis not present

## 2013-02-02 DIAGNOSIS — I129 Hypertensive chronic kidney disease with stage 1 through stage 4 chronic kidney disease, or unspecified chronic kidney disease: Secondary | ICD-10-CM | POA: Diagnosis not present

## 2013-02-02 DIAGNOSIS — L930 Discoid lupus erythematosus: Secondary | ICD-10-CM | POA: Diagnosis not present

## 2013-02-02 DIAGNOSIS — G40909 Epilepsy, unspecified, not intractable, without status epilepticus: Secondary | ICD-10-CM | POA: Diagnosis not present

## 2013-02-02 DIAGNOSIS — K2100 Gastro-esophageal reflux disease with esophagitis, without bleeding: Secondary | ICD-10-CM | POA: Diagnosis not present

## 2013-02-03 ENCOUNTER — Inpatient Hospital Stay: Payer: Self-pay | Admitting: Internal Medicine

## 2013-02-03 DIAGNOSIS — N186 End stage renal disease: Secondary | ICD-10-CM | POA: Diagnosis not present

## 2013-02-03 DIAGNOSIS — F3289 Other specified depressive episodes: Secondary | ICD-10-CM | POA: Diagnosis present

## 2013-02-03 DIAGNOSIS — G40909 Epilepsy, unspecified, not intractable, without status epilepticus: Secondary | ICD-10-CM | POA: Diagnosis present

## 2013-02-03 DIAGNOSIS — M216X9 Other acquired deformities of unspecified foot: Secondary | ICD-10-CM | POA: Diagnosis present

## 2013-02-03 DIAGNOSIS — Z7902 Long term (current) use of antithrombotics/antiplatelets: Secondary | ICD-10-CM | POA: Diagnosis not present

## 2013-02-03 DIAGNOSIS — G43909 Migraine, unspecified, not intractable, without status migrainosus: Secondary | ICD-10-CM | POA: Diagnosis present

## 2013-02-03 DIAGNOSIS — Z8673 Personal history of transient ischemic attack (TIA), and cerebral infarction without residual deficits: Secondary | ICD-10-CM | POA: Diagnosis not present

## 2013-02-03 DIAGNOSIS — F17200 Nicotine dependence, unspecified, uncomplicated: Secondary | ICD-10-CM | POA: Diagnosis present

## 2013-02-03 DIAGNOSIS — G609 Hereditary and idiopathic neuropathy, unspecified: Secondary | ICD-10-CM | POA: Diagnosis present

## 2013-02-03 DIAGNOSIS — I498 Other specified cardiac arrhythmias: Secondary | ICD-10-CM | POA: Diagnosis not present

## 2013-02-03 DIAGNOSIS — I120 Hypertensive chronic kidney disease with stage 5 chronic kidney disease or end stage renal disease: Secondary | ICD-10-CM | POA: Diagnosis present

## 2013-02-03 DIAGNOSIS — M329 Systemic lupus erythematosus, unspecified: Secondary | ICD-10-CM | POA: Diagnosis not present

## 2013-02-03 DIAGNOSIS — T82598A Other mechanical complication of other cardiac and vascular devices and implants, initial encounter: Secondary | ICD-10-CM | POA: Diagnosis not present

## 2013-02-03 DIAGNOSIS — Z992 Dependence on renal dialysis: Secondary | ICD-10-CM | POA: Diagnosis not present

## 2013-02-03 DIAGNOSIS — N2581 Secondary hyperparathyroidism of renal origin: Secondary | ICD-10-CM | POA: Diagnosis not present

## 2013-02-03 DIAGNOSIS — E785 Hyperlipidemia, unspecified: Secondary | ICD-10-CM | POA: Diagnosis present

## 2013-02-03 DIAGNOSIS — D631 Anemia in chronic kidney disease: Secondary | ICD-10-CM | POA: Diagnosis not present

## 2013-02-03 DIAGNOSIS — F70 Mild intellectual disabilities: Secondary | ICD-10-CM | POA: Diagnosis present

## 2013-02-03 DIAGNOSIS — T82898A Other specified complication of vascular prosthetic devices, implants and grafts, initial encounter: Secondary | ICD-10-CM | POA: Diagnosis present

## 2013-02-03 DIAGNOSIS — F329 Major depressive disorder, single episode, unspecified: Secondary | ICD-10-CM | POA: Diagnosis present

## 2013-02-03 DIAGNOSIS — N058 Unspecified nephritic syndrome with other morphologic changes: Secondary | ICD-10-CM | POA: Diagnosis present

## 2013-02-03 DIAGNOSIS — E875 Hyperkalemia: Secondary | ICD-10-CM | POA: Diagnosis not present

## 2013-02-03 DIAGNOSIS — Z7982 Long term (current) use of aspirin: Secondary | ICD-10-CM | POA: Diagnosis not present

## 2013-02-03 DIAGNOSIS — M1990 Unspecified osteoarthritis, unspecified site: Secondary | ICD-10-CM | POA: Diagnosis present

## 2013-02-03 LAB — HCG, QUANTITATIVE, PREGNANCY: Beta Hcg, Quant.: <1 m[IU]/mL — ABNORMAL LOW

## 2013-02-03 LAB — CBC WITH DIFFERENTIAL/PLATELET
Eosinophil %: 4.9 %
HGB: 10.4 g/dL — ABNORMAL LOW (ref 12.0–16.0)
Lymphocyte #: 1.8 10*3/uL (ref 1.0–3.6)
Lymphocyte %: 24.8 %
MCH: 29.6 pg (ref 26.0–34.0)
MCV: 88 fL (ref 80–100)
Monocyte %: 7.1 %
Platelet: 346 10*3/uL (ref 150–440)
RDW: 14.4 % (ref 11.5–14.5)
WBC: 7.4 10*3/uL (ref 3.6–11.0)

## 2013-02-04 LAB — BASIC METABOLIC PANEL
Anion Gap: 5 — ABNORMAL LOW (ref 7–16)
Calcium, Total: 8.6 mg/dL (ref 8.5–10.1)
Chloride: 98 mmol/L (ref 98–107)
Co2: 32 mmol/L (ref 21–32)
EGFR (African American): 23 — ABNORMAL LOW
EGFR (Non-African Amer.): 20 — ABNORMAL LOW
Osmolality: 270 (ref 275–301)
Potassium: 4.2 mmol/L (ref 3.5–5.1)
Sodium: 135 mmol/L — ABNORMAL LOW (ref 136–145)

## 2013-02-07 ENCOUNTER — Emergency Department (HOSPITAL_COMMUNITY): Payer: Medicare Other

## 2013-02-07 ENCOUNTER — Encounter (HOSPITAL_COMMUNITY): Payer: Self-pay | Admitting: Emergency Medicine

## 2013-02-07 ENCOUNTER — Emergency Department (HOSPITAL_COMMUNITY)
Admission: EM | Admit: 2013-02-07 | Discharge: 2013-02-07 | Disposition: A | Discharge status: Home / Self Care | Payer: Medicare Other | Attending: Emergency Medicine | Admitting: Emergency Medicine

## 2013-02-07 ENCOUNTER — Ambulatory Visit: Payer: Self-pay | Admitting: Vascular Surgery

## 2013-02-07 DIAGNOSIS — Z992 Dependence on renal dialysis: Secondary | ICD-10-CM | POA: Diagnosis not present

## 2013-02-07 DIAGNOSIS — F17200 Nicotine dependence, unspecified, uncomplicated: Secondary | ICD-10-CM | POA: Insufficient documentation

## 2013-02-07 DIAGNOSIS — Z7982 Long term (current) use of aspirin: Secondary | ICD-10-CM | POA: Insufficient documentation

## 2013-02-07 DIAGNOSIS — M329 Systemic lupus erythematosus, unspecified: Secondary | ICD-10-CM | POA: Insufficient documentation

## 2013-02-07 DIAGNOSIS — N186 End stage renal disease: Secondary | ICD-10-CM | POA: Diagnosis not present

## 2013-02-07 DIAGNOSIS — E875 Hyperkalemia: Secondary | ICD-10-CM | POA: Insufficient documentation

## 2013-02-07 DIAGNOSIS — Z7902 Long term (current) use of antithrombotics/antiplatelets: Secondary | ICD-10-CM | POA: Diagnosis not present

## 2013-02-07 DIAGNOSIS — E876 Hypokalemia: Secondary | ICD-10-CM | POA: Diagnosis not present

## 2013-02-07 DIAGNOSIS — T82898A Other specified complication of vascular prosthetic devices, implants and grafts, initial encounter: Secondary | ICD-10-CM | POA: Diagnosis not present

## 2013-02-07 DIAGNOSIS — R112 Nausea with vomiting, unspecified: Secondary | ICD-10-CM | POA: Diagnosis not present

## 2013-02-07 DIAGNOSIS — R0602 Shortness of breath: Secondary | ICD-10-CM | POA: Diagnosis not present

## 2013-02-07 DIAGNOSIS — Z79899 Other long term (current) drug therapy: Secondary | ICD-10-CM | POA: Insufficient documentation

## 2013-02-07 DIAGNOSIS — Z86718 Personal history of other venous thrombosis and embolism: Secondary | ICD-10-CM | POA: Diagnosis not present

## 2013-02-07 DIAGNOSIS — Z9889 Other specified postprocedural states: Secondary | ICD-10-CM | POA: Diagnosis not present

## 2013-02-07 HISTORY — DX: Dependence on renal dialysis: Z99.2

## 2013-02-07 LAB — CBC WITH DIFFERENTIAL/PLATELET
Eosinophils Relative: 0 % (ref 0–5)
HCT: 31.2 % — ABNORMAL LOW (ref 36.0–46.0)
Lymphocytes Relative: 8 % — ABNORMAL LOW (ref 12–46)
Lymphs Abs: 1 10*3/uL (ref 0.7–4.0)
MCV: 86.9 fL (ref 78.0–100.0)
Monocytes Absolute: 0.6 10*3/uL (ref 0.1–1.0)
RDW: 14.6 % (ref 11.5–15.5)
WBC: 13.5 10*3/uL — ABNORMAL HIGH (ref 4.0–10.5)

## 2013-02-07 LAB — COMPREHENSIVE METABOLIC PANEL
BUN: 36 mg/dL — ABNORMAL HIGH (ref 6–23)
CO2: 23 mEq/L (ref 19–32)
Calcium: 9.1 mg/dL (ref 8.4–10.5)
Creatinine, Ser: 4.88 mg/dL — ABNORMAL HIGH (ref 0.50–1.10)
GFR calc Af Amer: 12 mL/min — ABNORMAL LOW (ref >90)
GFR calc non Af Amer: 10 mL/min — ABNORMAL LOW (ref >90)
Glucose, Bld: 97 mg/dL (ref 70–99)

## 2013-02-07 LAB — BASIC METABOLIC PANEL
BUN: 33 mg/dL — ABNORMAL HIGH (ref 7–18)
Co2: 25 mmol/L (ref 21–32)
Creatinine: 5.16 mg/dL — ABNORMAL HIGH (ref 0.60–1.30)
EGFR (African American): 11 — ABNORMAL LOW
EGFR (Non-African Amer.): 10 — ABNORMAL LOW
Osmolality: 284 (ref 275–301)

## 2013-02-07 LAB — HCG, QUANTITATIVE, PREGNANCY: Beta Hcg, Quant.: <1 m[IU]/mL — ABNORMAL LOW

## 2013-02-07 MED ORDER — ONDANSETRON HCL 4 MG/2ML IJ SOLN
4.0000 mg | Freq: Once | INTRAMUSCULAR | Status: AC
  Administered 2013-02-07: 4 mg via INTRAVENOUS
  Filled 2013-02-07: qty 2

## 2013-02-07 MED ORDER — SODIUM POLYSTYRENE SULFONATE 15 GM/60ML PO SUSP: 30.0000 g | Freq: Once | ORAL | Status: AC

## 2013-02-07 MED ORDER — SODIUM POLYSTYRENE SULFONATE 15 GM/60ML PO SUSP
30.0000 g | Freq: Once | ORAL | Status: AC
  Administered 2013-02-07: 30 g via ORAL
  Filled 2013-02-07: qty 120

## 2013-02-07 MED ORDER — ONDANSETRON 8 MG PO TBDP: ORAL_TABLET | ORAL | Status: DC

## 2013-02-07 NOTE — ED Notes (Addendum)
Vomiting  Today, seen at [place] today to take care of clot in graft.  No diarrhea,   No fever.Pt is from [facility]. Drowsy,  Was supposed to have dialysis on Thursday. But unable to due to clot. Will have dialysis in morning.

## 2013-02-07 NOTE — ED Provider Notes (Signed)
CSN: [id number]     Arrival date & time 02/07/13  1841 History   First MD Initiated Contact with Patient 02/07/13 1858    Scribed for Veryl Speak, MD, the patient was seen in room APA07/APA07. This chart was scribed by Denice Bors, ED scribe. Patient's care was started at 7:08 PM  Chief Complaint  Patient presents with  . Emesis   (Consider location/radiation/quality/duration/timing/severity/associated sxs/prior Treatment) The history is provided by the patient and a relative. No language interpreter was used.   HPI Comments: ZAI LYNDON is a 37 y.o. female who presents to the Emergency Department complaining of intermittent non-bloody emesis onset today following de-clotting of dialysis graft at [facility]. States next dialysis treatment is tomorrow. Reports associated nausea. Denies any aggravating or alleviating factors. Denies associated abdominal pain, diarrhea, fever, chest pain, and shortness of breath. PCP is Dr. Luan Pulling    Past Medical History  Diagnosis Date  . Lupus   . Renal disorder   . Dialysis patient    Past Surgical History  Procedure Laterality Date  . Av fistula placement     History reviewed. No pertinent family history. History  Substance Use Topics  . Smoking status: Current Every Day Smoker    Types: Cigarettes  . Smokeless tobacco: Not on file  . Alcohol Use: No   OB History   Grav Para Term Preterm Abortions TAB SAB Ect Mult Living                 Review of Systems  Respiratory: Negative for shortness of breath.   Gastrointestinal: Positive for vomiting.  All other systems reviewed and are negative.   A complete 10 system review of systems was obtained and all systems are negative except as noted in the HPI and PMHx.    Allergies  Review of patient's allergies indicates no known allergies.  Home Medications   Current Outpatient Rx  Name  Route  Sig  Dispense  Refill  . albuterol (PROVENTIL) (2.5 MG/3ML) 0.083% nebulizer  solution   Nebulization   Take 2.5 mg by nebulization 4 (four) times daily as needed (breathing).         . ALPRAZolam (XANAX) 0.25 MG tablet   Oral   Take 0.25 mg by mouth 2 (two) times daily.         Marland Kitchen aspirin EC 81 MG tablet   Oral   Take 81 mg by mouth daily.         . clopidogrel (PLAVIX) 75 MG tablet   Oral   Take 75 mg by mouth daily.         Marland Kitchen docusate sodium (COLACE) 100 MG capsule   Oral   Take 100 mg by mouth daily.         Marland Kitchen epoetin alfa (EPOGEN,PROCRIT) 16109 UNIT/ML injection   Subcutaneous   Inject 4,000 Units into the skin 3 (three) times a week. Tuesday, Wednesday, and saturday         . hydroxychloroquine (PLAQUENIL) 200 MG tablet   Oral   Take 200 mg by mouth daily.         Marland Kitchen ipratropium (ATROVENT) 0.02 % nebulizer solution   Nebulization   Take 250 mcg by nebulization 4 (four) times daily as needed (breathing).         Marland Kitchen lidocaine-prilocaine (EMLA) cream               . loratadine (CLARITIN) 10 MG tablet   Oral   Take 10 mg  by mouth daily as needed for allergies.         . metoprolol (LOPRESSOR) 50 MG tablet   Oral   Take 50 mg by mouth 2 (two) times daily.         . midodrine (PROAMATINE) 10 MG tablet   Oral   Take 10 mg by mouth 3 (three) times a week. Monday, Wednesday, and Friday before dialysis         . multivitamin (RENA-VIT) TABS tablet   Oral   Take 1 tablet by mouth daily.         . pantoprazole (PROTONIX) 40 MG tablet   Oral   Take 40 mg by mouth daily.         . phenytoin (DILANTIN) 100 MG ER capsule   Oral   Take 100 mg by mouth 2 (two) times daily.         . sertraline (ZOLOFT) 100 MG tablet   Oral   Take 100 mg by mouth 2 (two) times daily.         . simvastatin (ZOCOR) 40 MG tablet   Oral   Take 40 mg by mouth daily.         . SPS 15 GM/60ML suspension   Oral   Take 30-60 mLs by mouth.          BP 136/77  Pulse 128  Temp(Src) 97.3 F (36.3 C) (Oral)  Resp 20  Wt 98 lb  (44.453 kg)  SpO2 %  LMP 02/07/2013 Physical Exam  Nursing note and vitals reviewed. Constitutional: She is oriented to person, place, and time. She appears well-developed and well-nourished. No distress.  Pt appears chronically ill and somewhat cachectic.  HENT:  Head: Normocephalic and atraumatic.  Eyes: EOM are normal.  Neck: Neck supple. No tracheal deviation present.  Cardiovascular: Normal rate and regular rhythm.   Pulmonary/Chest: Effort normal and breath sounds normal. No respiratory distress.  Abdominal: Soft. There is no tenderness.  Musculoskeletal: Normal range of motion.  The graft in the left upper bicep region is noted to have a palpable thrill. No redness, no erythema.   Neurological: She is alert and oriented to person, place, and time.  Skin: Skin is warm and dry.  Psychiatric: She has a normal mood and affect. Her behavior is normal.    ED Course  Procedures (including critical care time)  COORDINATION OF CARE:  Nursing notes reviewed. Vital signs reviewed. Initial pt interview and examination performed.   7:12 PM-Discussed work up plan with pt at bedside, which includes EKG, CBC with diff panel, CMP, and CXR. Pt agrees with plan.  10:26 PM Consulted with Nephrology. Recommends kayexalate and f/u in the office   10:28 PM Nursing Notes Reviewed/ Care Coordinated Applicable Imaging Reviewed  Interpretation of Laboratory Data incorporated into ED treatment Discussed results and treatment plan with pt. Pt demonstrates understanding and agrees with plan.   Treatment plan initiated: Medications  ondansetron [facility]) injection 4 mg (4 mg Intravenous Given 02/07/13 2000)     Initial diagnostic testing ordered.    Labs Review Labs Reviewed  CBC WITH DIFFERENTIAL - Abnormal; Notable for the following:    WBC 13.5 (*)    RBC 3.59 (*)    Hemoglobin 10.6 (*)    HCT 31.2 (*)    Neutrophils Relative % 87 (*)    Neutro Abs 11.7 (*)    Lymphocytes Relative 8  (*)    All other components within normal limits  COMPREHENSIVE  METABOLIC PANEL - Abnormal; Notable for the following:    Potassium 5.5 (*)    BUN 36 (*)    Creatinine, Ser 4.88 (*)    AST 38 (*)    GFR calc non Af Amer 10 (*)    GFR calc Af Amer 12 (*)    All other components within normal limits   Imaging Review Dg Chest 1 View  02/07/2013   CLINICAL DATA:  Nausea and vomiting, recent declot  EXAM: CHEST - 1 VIEW  COMPARISON:  None.  FINDINGS: Cardiac shadow is within normal limits. A HERO graft is seen with the tip at the cavoatrial junction. The lungs are clear. No pneumothorax or sizable effusion is seen.  IMPRESSION: No acute abnormality noted.   Electronically Signed   By: Inez Catalina M.D.   On: 02/07/2013 20:28    EKG Interpretation    Date/Time:  Monday February 07 2013 19:18:48 EST Ventricular Rate:  69 PR Interval:  144 QRS Duration: 72 QT Interval:  426 QTC Calculation: 456 R Axis:   70 Text Interpretation:  Normal sinus rhythm Possible Left atrial enlargement Borderline ECG When compared with ECG of 09-Feb-2008 14:12, Nonspecific T wave abnormality no longer evident in Inferior leads Nonspecific T wave abnormality no longer evident in Lateral leads QT has shortened Confirmed by DELOS  MD, Manolito Jurewicz ([id number]) on 02/07/2013 10:29:56 PM            MDM  No diagnosis found. Patient is a 37 year old female with history of end-stage renal disease on hemodialysis. She presents today with complaints of nausea and vomiting. She had her fistula declotted today at [facility] and her symptoms started shortly after this. Workup today reveals no acidosis however she does have a mildly elevated potassium of 5.5 with no EKG changes noted. She was given Zofran and seems to be feeling better. I discussed the care with Dr. Lowanda Foster who has recommended a dose of Kayexalate in the emergency department and followup for her scheduled dialysis in the morning.   I personally performed the  services described in this documentation, which was scribed in my presence. The recorded information has been reviewed and is accurate.       Veryl Speak, MD 02/07/13 2231

## 2013-02-07 NOTE — ED Notes (Signed)
Patient given discharge instruction, verbalized understand. Patient wheelchair out of the department.  

## 2013-02-09 DIAGNOSIS — N2581 Secondary hyperparathyroidism of renal origin: Secondary | ICD-10-CM | POA: Diagnosis not present

## 2013-02-09 DIAGNOSIS — D509 Iron deficiency anemia, unspecified: Secondary | ICD-10-CM | POA: Diagnosis not present

## 2013-02-09 DIAGNOSIS — D631 Anemia in chronic kidney disease: Secondary | ICD-10-CM | POA: Diagnosis not present

## 2013-02-09 DIAGNOSIS — N186 End stage renal disease: Secondary | ICD-10-CM | POA: Diagnosis not present

## 2013-02-11 DIAGNOSIS — N186 End stage renal disease: Secondary | ICD-10-CM | POA: Diagnosis not present

## 2013-02-11 DIAGNOSIS — N2581 Secondary hyperparathyroidism of renal origin: Secondary | ICD-10-CM | POA: Diagnosis not present

## 2013-02-11 DIAGNOSIS — D509 Iron deficiency anemia, unspecified: Secondary | ICD-10-CM | POA: Diagnosis not present

## 2013-02-11 DIAGNOSIS — D631 Anemia in chronic kidney disease: Secondary | ICD-10-CM | POA: Diagnosis not present

## 2013-02-13 DIAGNOSIS — N186 End stage renal disease: Secondary | ICD-10-CM | POA: Diagnosis not present

## 2013-02-14 DIAGNOSIS — D509 Iron deficiency anemia, unspecified: Secondary | ICD-10-CM | POA: Diagnosis not present

## 2013-02-14 DIAGNOSIS — N186 End stage renal disease: Secondary | ICD-10-CM | POA: Diagnosis not present

## 2013-02-14 DIAGNOSIS — D631 Anemia in chronic kidney disease: Secondary | ICD-10-CM | POA: Diagnosis not present

## 2013-02-14 DIAGNOSIS — N2581 Secondary hyperparathyroidism of renal origin: Secondary | ICD-10-CM | POA: Diagnosis not present

## 2013-02-14 DIAGNOSIS — Z992 Dependence on renal dialysis: Secondary | ICD-10-CM | POA: Diagnosis not present

## 2013-02-16 DIAGNOSIS — N182 Chronic kidney disease, stage 2 (mild): Secondary | ICD-10-CM | POA: Diagnosis not present

## 2013-02-17 ENCOUNTER — Ambulatory Visit: Payer: Self-pay | Admitting: Internal Medicine

## 2013-02-17 DIAGNOSIS — T82898A Other specified complication of vascular prosthetic devices, implants and grafts, initial encounter: Secondary | ICD-10-CM | POA: Diagnosis not present

## 2013-02-17 DIAGNOSIS — I120 Hypertensive chronic kidney disease with stage 5 chronic kidney disease or end stage renal disease: Secondary | ICD-10-CM | POA: Diagnosis not present

## 2013-02-17 DIAGNOSIS — D631 Anemia in chronic kidney disease: Secondary | ICD-10-CM | POA: Diagnosis not present

## 2013-02-17 DIAGNOSIS — Z992 Dependence on renal dialysis: Secondary | ICD-10-CM | POA: Diagnosis not present

## 2013-02-17 DIAGNOSIS — E876 Hypokalemia: Secondary | ICD-10-CM | POA: Diagnosis not present

## 2013-02-17 DIAGNOSIS — T859XXA Unspecified complication of internal prosthetic device, implant and graft, initial encounter: Secondary | ICD-10-CM | POA: Diagnosis not present

## 2013-02-17 DIAGNOSIS — Z7902 Long term (current) use of antithrombotics/antiplatelets: Secondary | ICD-10-CM | POA: Diagnosis not present

## 2013-02-17 DIAGNOSIS — F70 Mild intellectual disabilities: Secondary | ICD-10-CM | POA: Diagnosis not present

## 2013-02-17 DIAGNOSIS — F3289 Other specified depressive episodes: Secondary | ICD-10-CM | POA: Diagnosis not present

## 2013-02-17 DIAGNOSIS — I10 Essential (primary) hypertension: Secondary | ICD-10-CM | POA: Diagnosis not present

## 2013-02-17 DIAGNOSIS — Z79899 Other long term (current) drug therapy: Secondary | ICD-10-CM | POA: Diagnosis not present

## 2013-02-17 DIAGNOSIS — IMO0002 Reserved for concepts with insufficient information to code with codable children: Secondary | ICD-10-CM | POA: Diagnosis not present

## 2013-02-17 DIAGNOSIS — I739 Peripheral vascular disease, unspecified: Secondary | ICD-10-CM | POA: Diagnosis not present

## 2013-02-17 DIAGNOSIS — D638 Anemia in other chronic diseases classified elsewhere: Secondary | ICD-10-CM | POA: Diagnosis not present

## 2013-02-17 DIAGNOSIS — I059 Rheumatic mitral valve disease, unspecified: Secondary | ICD-10-CM | POA: Diagnosis not present

## 2013-02-17 DIAGNOSIS — F17200 Nicotine dependence, unspecified, uncomplicated: Secondary | ICD-10-CM | POA: Diagnosis not present

## 2013-02-17 DIAGNOSIS — G40909 Epilepsy, unspecified, not intractable, without status epilepticus: Secondary | ICD-10-CM | POA: Diagnosis not present

## 2013-02-17 DIAGNOSIS — I749 Embolism and thrombosis of unspecified artery: Secondary | ICD-10-CM | POA: Diagnosis not present

## 2013-02-17 DIAGNOSIS — M216X9 Other acquired deformities of unspecified foot: Secondary | ICD-10-CM | POA: Diagnosis not present

## 2013-02-17 DIAGNOSIS — T85898A Other specified complication of other internal prosthetic devices, implants and grafts, initial encounter: Secondary | ICD-10-CM | POA: Diagnosis not present

## 2013-02-17 DIAGNOSIS — E785 Hyperlipidemia, unspecified: Secondary | ICD-10-CM | POA: Diagnosis not present

## 2013-02-17 DIAGNOSIS — N058 Unspecified nephritic syndrome with other morphologic changes: Secondary | ICD-10-CM | POA: Diagnosis not present

## 2013-02-17 DIAGNOSIS — M1990 Unspecified osteoarthritis, unspecified site: Secondary | ICD-10-CM | POA: Diagnosis not present

## 2013-02-17 DIAGNOSIS — K219 Gastro-esophageal reflux disease without esophagitis: Secondary | ICD-10-CM | POA: Diagnosis not present

## 2013-02-17 DIAGNOSIS — E875 Hyperkalemia: Secondary | ICD-10-CM | POA: Diagnosis not present

## 2013-02-17 DIAGNOSIS — M329 Systemic lupus erythematosus, unspecified: Secondary | ICD-10-CM | POA: Diagnosis not present

## 2013-02-17 DIAGNOSIS — N186 End stage renal disease: Secondary | ICD-10-CM | POA: Diagnosis not present

## 2013-02-17 DIAGNOSIS — G609 Hereditary and idiopathic neuropathy, unspecified: Secondary | ICD-10-CM | POA: Diagnosis not present

## 2013-02-17 DIAGNOSIS — F411 Generalized anxiety disorder: Secondary | ICD-10-CM | POA: Diagnosis not present

## 2013-02-17 LAB — POTASSIUM: Potassium: 5.8 mmol/L — ABNORMAL HIGH (ref 3.5–5.1)

## 2013-02-17 LAB — HCG, QUANTITATIVE, PREGNANCY: Beta Hcg, Quant.: <1 m[IU]/mL — ABNORMAL LOW

## 2013-02-18 DIAGNOSIS — T85898A Other specified complication of other internal prosthetic devices, implants and grafts, initial encounter: Secondary | ICD-10-CM | POA: Diagnosis not present

## 2013-02-18 DIAGNOSIS — I10 Essential (primary) hypertension: Secondary | ICD-10-CM | POA: Diagnosis not present

## 2013-02-18 DIAGNOSIS — F17200 Nicotine dependence, unspecified, uncomplicated: Secondary | ICD-10-CM | POA: Diagnosis not present

## 2013-02-18 DIAGNOSIS — N186 End stage renal disease: Secondary | ICD-10-CM | POA: Diagnosis not present

## 2013-02-18 DIAGNOSIS — E785 Hyperlipidemia, unspecified: Secondary | ICD-10-CM | POA: Diagnosis not present

## 2013-02-18 DIAGNOSIS — E875 Hyperkalemia: Secondary | ICD-10-CM | POA: Diagnosis not present

## 2013-02-18 DIAGNOSIS — T82898A Other specified complication of vascular prosthetic devices, implants and grafts, initial encounter: Secondary | ICD-10-CM | POA: Diagnosis not present

## 2013-02-18 LAB — BASIC METABOLIC PANEL
Anion Gap: 4 — ABNORMAL LOW (ref 7–16)
BUN: 9 mg/dL (ref 7–18)
Co2: 33 mmol/L — ABNORMAL HIGH (ref 21–32)
EGFR (African American): 37 — ABNORMAL LOW
EGFR (Non-African Amer.): 32 — ABNORMAL LOW
Glucose: 73 mg/dL (ref 65–99)
Osmolality: 271 (ref 275–301)
Potassium: 3.9 mmol/L (ref 3.5–5.1)

## 2013-03-08 DIAGNOSIS — T82898A Other specified complication of vascular prosthetic devices, implants and grafts, initial encounter: Secondary | ICD-10-CM | POA: Diagnosis not present

## 2013-03-08 DIAGNOSIS — I10 Essential (primary) hypertension: Secondary | ICD-10-CM | POA: Diagnosis not present

## 2013-03-08 DIAGNOSIS — N186 End stage renal disease: Secondary | ICD-10-CM | POA: Diagnosis not present

## 2013-03-16 DIAGNOSIS — N186 End stage renal disease: Secondary | ICD-10-CM | POA: Diagnosis not present

## 2013-03-18 DIAGNOSIS — D631 Anemia in chronic kidney disease: Secondary | ICD-10-CM | POA: Diagnosis not present

## 2013-03-18 DIAGNOSIS — D509 Iron deficiency anemia, unspecified: Secondary | ICD-10-CM | POA: Diagnosis not present

## 2013-03-18 DIAGNOSIS — N186 End stage renal disease: Secondary | ICD-10-CM | POA: Diagnosis not present

## 2013-03-18 DIAGNOSIS — N2581 Secondary hyperparathyroidism of renal origin: Secondary | ICD-10-CM | POA: Diagnosis not present

## 2013-03-23 DIAGNOSIS — E119 Type 2 diabetes mellitus without complications: Secondary | ICD-10-CM | POA: Diagnosis not present

## 2013-03-23 DIAGNOSIS — N186 End stage renal disease: Secondary | ICD-10-CM | POA: Diagnosis not present

## 2013-03-24 DIAGNOSIS — M216X9 Other acquired deformities of unspecified foot: Secondary | ICD-10-CM | POA: Diagnosis not present

## 2013-03-24 DIAGNOSIS — M79609 Pain in unspecified limb: Secondary | ICD-10-CM | POA: Diagnosis not present

## 2013-03-24 DIAGNOSIS — Q828 Other specified congenital malformations of skin: Secondary | ICD-10-CM | POA: Diagnosis not present

## 2013-03-24 DIAGNOSIS — M25579 Pain in unspecified ankle and joints of unspecified foot: Secondary | ICD-10-CM | POA: Diagnosis not present

## 2013-04-16 DIAGNOSIS — N186 End stage renal disease: Secondary | ICD-10-CM | POA: Diagnosis not present

## 2013-04-18 DIAGNOSIS — N186 End stage renal disease: Secondary | ICD-10-CM | POA: Diagnosis not present

## 2013-04-18 DIAGNOSIS — N2581 Secondary hyperparathyroidism of renal origin: Secondary | ICD-10-CM | POA: Diagnosis not present

## 2013-04-18 DIAGNOSIS — D509 Iron deficiency anemia, unspecified: Secondary | ICD-10-CM | POA: Diagnosis not present

## 2013-04-20 DIAGNOSIS — N186 End stage renal disease: Secondary | ICD-10-CM | POA: Diagnosis not present

## 2013-05-14 DIAGNOSIS — N186 End stage renal disease: Secondary | ICD-10-CM | POA: Diagnosis not present

## 2013-05-16 DIAGNOSIS — N186 End stage renal disease: Secondary | ICD-10-CM | POA: Diagnosis not present

## 2013-05-16 DIAGNOSIS — N2581 Secondary hyperparathyroidism of renal origin: Secondary | ICD-10-CM | POA: Diagnosis not present

## 2013-05-16 DIAGNOSIS — D509 Iron deficiency anemia, unspecified: Secondary | ICD-10-CM | POA: Diagnosis not present

## 2013-05-16 DIAGNOSIS — D631 Anemia in chronic kidney disease: Secondary | ICD-10-CM | POA: Diagnosis not present

## 2013-06-07 DIAGNOSIS — T82898A Other specified complication of vascular prosthetic devices, implants and grafts, initial encounter: Secondary | ICD-10-CM | POA: Diagnosis not present

## 2013-06-07 DIAGNOSIS — I10 Essential (primary) hypertension: Secondary | ICD-10-CM | POA: Diagnosis not present

## 2013-06-07 DIAGNOSIS — N186 End stage renal disease: Secondary | ICD-10-CM | POA: Diagnosis not present

## 2013-06-14 DIAGNOSIS — N186 End stage renal disease: Secondary | ICD-10-CM | POA: Diagnosis not present

## 2013-06-15 DIAGNOSIS — D509 Iron deficiency anemia, unspecified: Secondary | ICD-10-CM | POA: Diagnosis not present

## 2013-06-15 DIAGNOSIS — D631 Anemia in chronic kidney disease: Secondary | ICD-10-CM | POA: Diagnosis not present

## 2013-06-15 DIAGNOSIS — N186 End stage renal disease: Secondary | ICD-10-CM | POA: Diagnosis not present

## 2013-06-15 DIAGNOSIS — N2581 Secondary hyperparathyroidism of renal origin: Secondary | ICD-10-CM | POA: Diagnosis not present

## 2013-06-16 ENCOUNTER — Ambulatory Visit: Payer: Self-pay | Admitting: Vascular Surgery

## 2013-06-16 DIAGNOSIS — T82898A Other specified complication of vascular prosthetic devices, implants and grafts, initial encounter: Secondary | ICD-10-CM | POA: Diagnosis not present

## 2013-06-16 DIAGNOSIS — I10 Essential (primary) hypertension: Secondary | ICD-10-CM | POA: Diagnosis not present

## 2013-06-16 DIAGNOSIS — Z992 Dependence on renal dialysis: Secondary | ICD-10-CM | POA: Diagnosis not present

## 2013-06-16 DIAGNOSIS — Z79899 Other long term (current) drug therapy: Secondary | ICD-10-CM | POA: Diagnosis not present

## 2013-06-16 DIAGNOSIS — I120 Hypertensive chronic kidney disease with stage 5 chronic kidney disease or end stage renal disease: Secondary | ICD-10-CM | POA: Diagnosis not present

## 2013-06-16 DIAGNOSIS — N186 End stage renal disease: Secondary | ICD-10-CM | POA: Diagnosis not present

## 2013-06-16 DIAGNOSIS — Z7982 Long term (current) use of aspirin: Secondary | ICD-10-CM | POA: Diagnosis not present

## 2013-06-16 DIAGNOSIS — F17200 Nicotine dependence, unspecified, uncomplicated: Secondary | ICD-10-CM | POA: Diagnosis not present

## 2013-06-16 LAB — POTASSIUM: POTASSIUM: 4 mmol/L (ref 3.5–5.1)

## 2013-06-16 LAB — HCG, QUANTITATIVE, PREGNANCY

## 2013-06-17 ENCOUNTER — Encounter (HOSPITAL_COMMUNITY): Payer: Self-pay | Admitting: Emergency Medicine

## 2013-06-17 ENCOUNTER — Emergency Department (HOSPITAL_COMMUNITY): Payer: Medicare Other

## 2013-06-17 ENCOUNTER — Emergency Department (HOSPITAL_COMMUNITY)
Admission: EM | Admit: 2013-06-17 | Discharge: 2013-06-17 | Disposition: A | Discharge status: Home / Self Care | Payer: Medicare Other | Attending: Emergency Medicine | Admitting: Emergency Medicine

## 2013-06-17 DIAGNOSIS — Z7982 Long term (current) use of aspirin: Secondary | ICD-10-CM | POA: Insufficient documentation

## 2013-06-17 DIAGNOSIS — R091 Pleurisy: Secondary | ICD-10-CM

## 2013-06-17 DIAGNOSIS — N186 End stage renal disease: Secondary | ICD-10-CM | POA: Diagnosis not present

## 2013-06-17 DIAGNOSIS — Z79899 Other long term (current) drug therapy: Secondary | ICD-10-CM | POA: Insufficient documentation

## 2013-06-17 DIAGNOSIS — Z992 Dependence on renal dialysis: Secondary | ICD-10-CM | POA: Diagnosis not present

## 2013-06-17 DIAGNOSIS — Z862 Personal history of diseases of the blood and blood-forming organs and certain disorders involving the immune mechanism: Secondary | ICD-10-CM | POA: Diagnosis not present

## 2013-06-17 DIAGNOSIS — F17200 Nicotine dependence, unspecified, uncomplicated: Secondary | ICD-10-CM | POA: Insufficient documentation

## 2013-06-17 DIAGNOSIS — Z8739 Personal history of other diseases of the musculoskeletal system and connective tissue: Secondary | ICD-10-CM | POA: Insufficient documentation

## 2013-06-17 DIAGNOSIS — R05 Cough: Secondary | ICD-10-CM | POA: Diagnosis not present

## 2013-06-17 DIAGNOSIS — R059 Cough, unspecified: Secondary | ICD-10-CM | POA: Diagnosis not present

## 2013-06-17 HISTORY — DX: Anemia, unspecified: D64.9

## 2013-06-17 LAB — CBC WITH DIFFERENTIAL/PLATELET
BASOS ABS: 0.1 10*3/uL (ref 0.0–0.1)
Basophils Relative: 1 % (ref 0–1)
Eosinophils Absolute: 0.2 10*3/uL (ref 0.0–0.7)
Eosinophils Relative: 2 % (ref 0–5)
HCT: 31.7 % — ABNORMAL LOW (ref 36.0–46.0)
Hemoglobin: 11 g/dL — ABNORMAL LOW (ref 12.0–15.0)
LYMPHS PCT: 15 % (ref 12–46)
Lymphs Abs: 1.6 10*3/uL (ref 0.7–4.0)
MCH: 28.9 pg (ref 26.0–34.0)
MCHC: 34.7 g/dL (ref 30.0–36.0)
MCV: 83.2 fL (ref 78.0–100.0)
Monocytes Absolute: 0.9 10*3/uL (ref 0.1–1.0)
Monocytes Relative: 8 % (ref 3–12)
NEUTROS PCT: 75 % (ref 43–77)
Neutro Abs: 8.1 10*3/uL — ABNORMAL HIGH (ref 1.7–7.7)
PLATELETS: 306 10*3/uL (ref 150–400)
RBC: 3.81 MIL/uL — AB (ref 3.87–5.11)
RDW: 14 % (ref 11.5–15.5)
WBC: 10.8 10*3/uL — ABNORMAL HIGH (ref 4.0–10.5)

## 2013-06-17 LAB — COMPREHENSIVE METABOLIC PANEL
ALBUMIN: 3.7 g/dL (ref 3.5–5.2)
ALT: 12 U/L (ref 0–35)
AST: 17 U/L (ref 0–37)
Alkaline Phosphatase: 92 U/L (ref 39–117)
BUN: 33 mg/dL — ABNORMAL HIGH (ref 6–23)
CALCIUM: 9.3 mg/dL (ref 8.4–10.5)
CHLORIDE: 95 meq/L — AB (ref 96–112)
CO2: 28 meq/L (ref 19–32)
Creatinine, Ser: 5.71 mg/dL — ABNORMAL HIGH (ref 0.50–1.10)
GFR calc Af Amer: 10 mL/min — ABNORMAL LOW (ref >90)
GFR, EST NON AFRICAN AMERICAN: 9 mL/min — AB (ref >90)
Glucose, Bld: 84 mg/dL (ref 70–99)
Potassium: 4.4 mEq/L (ref 3.7–5.3)
SODIUM: 139 meq/L (ref 137–147)
Total Bilirubin: 0.2 mg/dL — ABNORMAL LOW (ref 0.3–1.2)
Total Protein: 8.1 g/dL (ref 6.0–8.3)

## 2013-06-17 LAB — LIPASE, BLOOD: Lipase: 56 U/L (ref 11–59)

## 2013-06-17 MED ORDER — ONDANSETRON HCL 4 MG/2ML IJ SOLN
4.0000 mg | Freq: Once | INTRAMUSCULAR | Status: AC
  Administered 2013-06-17: 4 mg via INTRAVENOUS
  Filled 2013-06-17: qty 2

## 2013-06-17 MED ORDER — HYDROCODONE-ACETAMINOPHEN 5-325 MG PO TABS: 2.0000 | ORAL_TABLET | ORAL | Status: DC | PRN

## 2013-06-17 MED ORDER — KETOROLAC TROMETHAMINE 30 MG/ML IJ SOLN
30.0000 mg | Freq: Once | INTRAMUSCULAR | Status: AC
  Administered 2013-06-17: 30 mg via INTRAVENOUS
  Filled 2013-06-17: qty 1

## 2013-06-17 MED ORDER — MORPHINE SULFATE 4 MG/ML IJ SOLN
4.0000 mg | Freq: Once | INTRAMUSCULAR | Status: AC
Start: 2013-06-17 — End: 2013-06-17
  Administered 2013-06-17: 4 mg via INTRAVENOUS
  Filled 2013-06-17: qty 1

## 2013-06-17 NOTE — ED Notes (Signed)
Pt did not have dialysis done today, plans to go back once discharged

## 2013-06-17 NOTE — Discharge Instructions (Signed)
Pleurisy °Pleurisy is redness, puffiness (swelling), and soreness (inflammation) of the lining of the lungs. It can be hard to breathe and hurt to breathe. Coughing or deep breathing will make it hurt more. It is often caused by an existing infection or disease.  °HOME CARE °· Only take medicine as told by your doctor. °· Only take antibiotic medicine as directed. Make sure to finish it even if you start to feel better. °GET HELP RIGHT AWAY IF:  °· Your lips, fingernails, or toenails are blue or dark. °· You cough up blood. °· You have a hard time breathing. °· Your pain is not controlled with medicine or it lasts for more than 1 week. °· Your pain spreads (radiates) into your neck, arms, or jaw. °· You are short of breath or wheezing. °· You develop a fever, rash, throw up (vomit), or faint. °MAKE SURE YOU:  °· Understand these instructions. °· Will watch your condition. °· Will get help right away if you are not doing well or get worse. °Document Released: 02/14/2008 Document Revised: 11/03/2012 Document Reviewed: 08/15/2012 °[employer]® Patient Information ©2014 [employer]. ° °

## 2013-06-17 NOTE — ED Notes (Signed)
Pt states cough and abd pain started today, pt voiced no other complaints

## 2013-06-17 NOTE — ED Notes (Signed)
Pt sent from dialysis for cough, abdominal pain and weakness.

## 2013-06-17 NOTE — ED Provider Notes (Addendum)
CSN: [id number]     Arrival date & time 06/17/13  1130 History   First MD Initiated Contact with Patient 06/17/13 1222     Chief Complaint  Patient presents with  . Cough      HPI  Patient presents with a cough and some left-sided sharp chest pain. She was going to dialysis when she developed the symptoms.  She has had a slight  cough for about 24 hours. She's not been short of breath. Sudden sharp left-sided pain is not lightheaded or short of breath. No fevers no chills. No falls no injuries. No abdominal pain nausea or vomiting. She is a Monday Wednesday dialysis patient. Has not missed any recent episodes of dialysis. No history of heart disease. No history of DVT or PE.  Past Medical History  Diagnosis Date  . Lupus   . Renal disorder   . Dialysis patient   . Anemia    Past Surgical History  Procedure Laterality Date  . Av fistula placement     History reviewed. No pertinent family history. History  Substance Use Topics  . Smoking status: Current Every Day Smoker    Types: Cigarettes  . Smokeless tobacco: Not on file  . Alcohol Use: No   OB History   Grav Para Term Preterm Abortions TAB SAB Ect Mult Living                 Review of Systems  Constitutional: Negative for fever, chills, diaphoresis, appetite change and fatigue.  HENT: Negative for mouth sores, sore throat and trouble swallowing.   Eyes: Negative for visual disturbance.  Respiratory: Negative for cough, chest tightness, shortness of breath and wheezing.   Cardiovascular: Positive for chest pain.       Sharp anterior pleuritic chest pain.  Gastrointestinal: Negative for nausea, vomiting, abdominal pain, diarrhea and abdominal distention.  Endocrine: Negative for polydipsia, polyphagia and polyuria.  Genitourinary: Negative for dysuria, frequency and hematuria.  Musculoskeletal: Negative for gait problem.  Skin: Negative for color change, pallor and rash.  Neurological: Negative for dizziness, syncope,  light-headedness and headaches.  Hematological: Does not bruise/bleed easily.  Psychiatric/Behavioral: Negative for behavioral problems and confusion.      Allergies  Review of patient's allergies indicates no known allergies.  Home Medications   Current Outpatient Rx  Name  Route  Sig  Dispense  Refill  . albuterol (PROVENTIL) (2.5 MG/3ML) 0.083% nebulizer solution   Nebulization   Take 2.5 mg by nebulization 4 (four) times daily as needed (breathing).         . ALPRAZolam (XANAX) 0.25 MG tablet   Oral   Take 0.25 mg by mouth 2 (two) times daily as needed for anxiety.          Marland Kitchen aspirin EC 81 MG tablet   Oral   Take 81 mg by mouth daily.         . carvedilol (COREG) 25 MG tablet   Oral   Take 25 mg by mouth daily.         . chlorhexidine (PERIDEX) 0.12 % solution   Mouth/Throat   Use as directed 10 mLs in the mouth or throat 2 (two) times daily.         Marland Kitchen docusate sodium (COLACE) 100 MG capsule   Oral   Take 100 mg by mouth daily.         Marland Kitchen gabapentin (NEURONTIN) 100 MG capsule   Oral   Take 100 mg by mouth daily.         Marland Kitchen  HYDROcodone-acetaminophen (VICODIN) 5-500 MG per tablet   Oral   Take 1 tablet by mouth every 4 (four) hours as needed for pain.         . hydroxychloroquine (PLAQUENIL) 200 MG tablet   Oral   Take 200 mg by mouth daily.         Marland Kitchen ipratropium (ATROVENT) 0.02 % nebulizer solution   Nebulization   Take 250 mcg by nebulization 4 (four) times daily as needed (breathing).         Marland Kitchen lidocaine-prilocaine (EMLA) cream   Topical   Apply 1 application topically as needed (for port access).          Marland Kitchen loratadine (CLARITIN) 10 MG tablet   Oral   Take 10 mg by mouth daily as needed for allergies.         . midodrine (PROAMATINE) 10 MG tablet   Oral   Take 10 mg by mouth 2 (two) times daily. Monday, Wednesday, and Friday before dialysis         . multivitamin (RENA-VIT) TABS tablet   Oral   Take 1 tablet by mouth  daily.         . pantoprazole (PROTONIX) 40 MG tablet   Oral   Take 40 mg by mouth daily.         . phenytoin (DILANTIN) 100 MG ER capsule   Oral   Take 200 mg by mouth 2 (two) times daily.          . polyethylene glycol (MIRALAX / GLYCOLAX) packet   Oral   Take 17 g by mouth daily as needed for mild constipation.         . sertraline (ZOLOFT) 100 MG tablet   Oral   Take 200 mg by mouth 2 (two) times daily.          . sevelamer carbonate (RENVELA) 800 MG tablet   Oral   Take 800-1,600 mg by mouth 4 (four) times daily. Takes 1600 mg with each meal and 800 mg with snack         . simvastatin (ZOCOR) 40 MG tablet   Oral   Take 40 mg by mouth daily.         Marland Kitchen epoetin alfa (EPOGEN,PROCRIT) 36644 UNIT/ML injection   Subcutaneous   Inject 4,000 Units into the skin 3 (three) times a week. Mondays, Wednesdays, Fridays         . HYDROcodone-acetaminophen (NORCO/VICODIN) 5-325 MG per tablet   Oral   Take 2 tablets by mouth every 4 (four) hours as needed.   10 tablet   0    BP 95/67  Pulse 76  Temp(Src) 98.1 F (36.7 C) (Oral)  Resp 18  Ht 5' (1.524 m)  Wt 90 lb (40.824 kg)  BMI 17.58 kg/m2  SpO2 96%  LMP 05/29/2013 Physical Exam  Constitutional: She is oriented to person, place, and time. She appears well-developed. No distress.  Thin black female. Awake alert. No distress. Not tachypneic. Not tachycardic.  HENT:  Head: Normocephalic.  Eyes: Conjunctivae are normal. Pupils are equal, round, and reactive to light. No scleral icterus.  Neck: Normal range of motion. Neck supple. No thyromegaly present.  Cardiovascular: Normal rate and regular rhythm.  Exam reveals no gallop and no friction rub.   No murmur heard. Pulmonary/Chest: Effort normal and breath sounds normal. No respiratory distress. She has no wheezes. She has no rales.    Abdominal: Soft. Bowel sounds are normal. She exhibits no distension.  There is no tenderness. There is no rebound.    Musculoskeletal: Normal range of motion.  Neurological: She is alert and oriented to person, place, and time.  Skin: Skin is warm and dry. No rash noted.  Psychiatric: She has a normal mood and affect. Her behavior is normal.    ED Course  Procedures (including critical care time) Labs Review Labs Reviewed  CBC WITH DIFFERENTIAL - Abnormal; Notable for the following:    WBC 10.8 (*)    RBC 3.81 (*)    Hemoglobin 11.0 (*)    HCT 31.7 (*)    Neutro Abs 8.1 (*)    All other components within normal limits  COMPREHENSIVE METABOLIC PANEL - Abnormal; Notable for the following:    Chloride 95 (*)    BUN 33 (*)    Creatinine, Ser 5.71 (*)    Total Bilirubin 0.2 (*)    GFR calc non Af Amer 9 (*)    GFR calc Af Amer 10 (*)    All other components within normal limits  LIPASE, BLOOD   Imaging Review Dg Chest 2 View  06/17/2013   CLINICAL DATA:  Cough, abdominal pain  EXAM: CHEST - 2 VIEW  COMPARISON:  02/07/2013  FINDINGS: Stable left he rode catheter to the cavoatrial junction. Lungs clear. Heart size normal. . No effusion. Visualized skeletal structures are unremarkable.  IMPRESSION: No acute cardiopulmonary disease.   Electronically Signed   By: Arne Cleveland M.D.   On: 06/17/2013 12:14     EKG Interpretation None      MDM   Final diagnoses:  Pleurisy    EKG showed no acute or ischemic changes no injury or ectopy. Chest x-ray shows no abnormalities. WBC count is 10.  Potassium is 4.4.  Is not tachypneic or tachycardic. No risk for PE. This is  simple pleurisy. Plan will be simply control. Vicodin as needed.   Tanna Furry, MD 06/17/13 1452  Tanna Furry, MD 06/17/13 [phone]

## 2013-06-18 DIAGNOSIS — D631 Anemia in chronic kidney disease: Secondary | ICD-10-CM | POA: Diagnosis not present

## 2013-06-18 DIAGNOSIS — N186 End stage renal disease: Secondary | ICD-10-CM | POA: Diagnosis not present

## 2013-06-18 DIAGNOSIS — N2581 Secondary hyperparathyroidism of renal origin: Secondary | ICD-10-CM | POA: Diagnosis not present

## 2013-06-18 DIAGNOSIS — D509 Iron deficiency anemia, unspecified: Secondary | ICD-10-CM | POA: Diagnosis not present

## 2013-06-20 DIAGNOSIS — N186 End stage renal disease: Secondary | ICD-10-CM | POA: Diagnosis not present

## 2013-06-20 DIAGNOSIS — N2581 Secondary hyperparathyroidism of renal origin: Secondary | ICD-10-CM | POA: Diagnosis not present

## 2013-06-20 DIAGNOSIS — D631 Anemia in chronic kidney disease: Secondary | ICD-10-CM | POA: Diagnosis not present

## 2013-06-20 DIAGNOSIS — D509 Iron deficiency anemia, unspecified: Secondary | ICD-10-CM | POA: Diagnosis not present

## 2013-06-22 DIAGNOSIS — E119 Type 2 diabetes mellitus without complications: Secondary | ICD-10-CM | POA: Diagnosis not present

## 2013-06-22 DIAGNOSIS — D631 Anemia in chronic kidney disease: Secondary | ICD-10-CM | POA: Diagnosis not present

## 2013-06-22 DIAGNOSIS — N186 End stage renal disease: Secondary | ICD-10-CM | POA: Diagnosis not present

## 2013-06-22 DIAGNOSIS — N2581 Secondary hyperparathyroidism of renal origin: Secondary | ICD-10-CM | POA: Diagnosis not present

## 2013-06-22 DIAGNOSIS — D509 Iron deficiency anemia, unspecified: Secondary | ICD-10-CM | POA: Diagnosis not present

## 2013-06-24 DIAGNOSIS — N186 End stage renal disease: Secondary | ICD-10-CM | POA: Diagnosis not present

## 2013-06-24 DIAGNOSIS — D509 Iron deficiency anemia, unspecified: Secondary | ICD-10-CM | POA: Diagnosis not present

## 2013-06-24 DIAGNOSIS — N2581 Secondary hyperparathyroidism of renal origin: Secondary | ICD-10-CM | POA: Diagnosis not present

## 2013-06-24 DIAGNOSIS — D631 Anemia in chronic kidney disease: Secondary | ICD-10-CM | POA: Diagnosis not present

## 2013-06-27 DIAGNOSIS — D631 Anemia in chronic kidney disease: Secondary | ICD-10-CM | POA: Diagnosis not present

## 2013-06-27 DIAGNOSIS — D509 Iron deficiency anemia, unspecified: Secondary | ICD-10-CM | POA: Diagnosis not present

## 2013-06-27 DIAGNOSIS — N039 Chronic nephritic syndrome with unspecified morphologic changes: Secondary | ICD-10-CM | POA: Diagnosis not present

## 2013-06-27 DIAGNOSIS — N186 End stage renal disease: Secondary | ICD-10-CM | POA: Diagnosis not present

## 2013-06-27 DIAGNOSIS — N2581 Secondary hyperparathyroidism of renal origin: Secondary | ICD-10-CM | POA: Diagnosis not present

## 2013-06-29 DIAGNOSIS — D631 Anemia in chronic kidney disease: Secondary | ICD-10-CM | POA: Diagnosis not present

## 2013-06-29 DIAGNOSIS — N186 End stage renal disease: Secondary | ICD-10-CM | POA: Diagnosis not present

## 2013-06-29 DIAGNOSIS — D509 Iron deficiency anemia, unspecified: Secondary | ICD-10-CM | POA: Diagnosis not present

## 2013-06-29 DIAGNOSIS — N2581 Secondary hyperparathyroidism of renal origin: Secondary | ICD-10-CM | POA: Diagnosis not present

## 2013-07-01 DIAGNOSIS — N2581 Secondary hyperparathyroidism of renal origin: Secondary | ICD-10-CM | POA: Diagnosis not present

## 2013-07-01 DIAGNOSIS — D631 Anemia in chronic kidney disease: Secondary | ICD-10-CM | POA: Diagnosis not present

## 2013-07-01 DIAGNOSIS — N186 End stage renal disease: Secondary | ICD-10-CM | POA: Diagnosis not present

## 2013-07-01 DIAGNOSIS — D509 Iron deficiency anemia, unspecified: Secondary | ICD-10-CM | POA: Diagnosis not present

## 2013-07-04 DIAGNOSIS — D509 Iron deficiency anemia, unspecified: Secondary | ICD-10-CM | POA: Diagnosis not present

## 2013-07-04 DIAGNOSIS — D631 Anemia in chronic kidney disease: Secondary | ICD-10-CM | POA: Diagnosis not present

## 2013-07-04 DIAGNOSIS — N2581 Secondary hyperparathyroidism of renal origin: Secondary | ICD-10-CM | POA: Diagnosis not present

## 2013-07-04 DIAGNOSIS — N186 End stage renal disease: Secondary | ICD-10-CM | POA: Diagnosis not present

## 2013-07-06 DIAGNOSIS — N186 End stage renal disease: Secondary | ICD-10-CM | POA: Diagnosis not present

## 2013-07-06 DIAGNOSIS — N2581 Secondary hyperparathyroidism of renal origin: Secondary | ICD-10-CM | POA: Diagnosis not present

## 2013-07-06 DIAGNOSIS — D509 Iron deficiency anemia, unspecified: Secondary | ICD-10-CM | POA: Diagnosis not present

## 2013-07-06 DIAGNOSIS — N039 Chronic nephritic syndrome with unspecified morphologic changes: Secondary | ICD-10-CM | POA: Diagnosis not present

## 2013-07-06 DIAGNOSIS — D631 Anemia in chronic kidney disease: Secondary | ICD-10-CM | POA: Diagnosis not present

## 2013-07-08 DIAGNOSIS — N2581 Secondary hyperparathyroidism of renal origin: Secondary | ICD-10-CM | POA: Diagnosis not present

## 2013-07-08 DIAGNOSIS — D509 Iron deficiency anemia, unspecified: Secondary | ICD-10-CM | POA: Diagnosis not present

## 2013-07-08 DIAGNOSIS — N186 End stage renal disease: Secondary | ICD-10-CM | POA: Diagnosis not present

## 2013-07-08 DIAGNOSIS — D631 Anemia in chronic kidney disease: Secondary | ICD-10-CM | POA: Diagnosis not present

## 2013-07-08 DIAGNOSIS — N039 Chronic nephritic syndrome with unspecified morphologic changes: Secondary | ICD-10-CM | POA: Diagnosis not present

## 2013-07-11 DIAGNOSIS — D631 Anemia in chronic kidney disease: Secondary | ICD-10-CM | POA: Diagnosis not present

## 2013-07-11 DIAGNOSIS — N186 End stage renal disease: Secondary | ICD-10-CM | POA: Diagnosis not present

## 2013-07-11 DIAGNOSIS — D509 Iron deficiency anemia, unspecified: Secondary | ICD-10-CM | POA: Diagnosis not present

## 2013-07-11 DIAGNOSIS — N2581 Secondary hyperparathyroidism of renal origin: Secondary | ICD-10-CM | POA: Diagnosis not present

## 2013-07-13 DIAGNOSIS — D631 Anemia in chronic kidney disease: Secondary | ICD-10-CM | POA: Diagnosis not present

## 2013-07-13 DIAGNOSIS — D509 Iron deficiency anemia, unspecified: Secondary | ICD-10-CM | POA: Diagnosis not present

## 2013-07-13 DIAGNOSIS — N186 End stage renal disease: Secondary | ICD-10-CM | POA: Diagnosis not present

## 2013-07-13 DIAGNOSIS — N039 Chronic nephritic syndrome with unspecified morphologic changes: Secondary | ICD-10-CM | POA: Diagnosis not present

## 2013-07-13 DIAGNOSIS — N2581 Secondary hyperparathyroidism of renal origin: Secondary | ICD-10-CM | POA: Diagnosis not present

## 2013-07-14 DIAGNOSIS — N186 End stage renal disease: Secondary | ICD-10-CM | POA: Diagnosis not present

## 2013-07-14 DIAGNOSIS — I10 Essential (primary) hypertension: Secondary | ICD-10-CM | POA: Diagnosis not present

## 2013-07-14 DIAGNOSIS — T82898A Other specified complication of vascular prosthetic devices, implants and grafts, initial encounter: Secondary | ICD-10-CM | POA: Diagnosis not present

## 2013-07-15 DIAGNOSIS — N2581 Secondary hyperparathyroidism of renal origin: Secondary | ICD-10-CM | POA: Diagnosis not present

## 2013-07-15 DIAGNOSIS — N186 End stage renal disease: Secondary | ICD-10-CM | POA: Diagnosis not present

## 2013-07-15 DIAGNOSIS — D631 Anemia in chronic kidney disease: Secondary | ICD-10-CM | POA: Diagnosis not present

## 2013-07-15 DIAGNOSIS — D509 Iron deficiency anemia, unspecified: Secondary | ICD-10-CM | POA: Diagnosis not present

## 2013-07-18 DIAGNOSIS — N2581 Secondary hyperparathyroidism of renal origin: Secondary | ICD-10-CM | POA: Diagnosis not present

## 2013-07-18 DIAGNOSIS — N186 End stage renal disease: Secondary | ICD-10-CM | POA: Diagnosis not present

## 2013-07-18 DIAGNOSIS — D631 Anemia in chronic kidney disease: Secondary | ICD-10-CM | POA: Diagnosis not present

## 2013-07-18 DIAGNOSIS — D509 Iron deficiency anemia, unspecified: Secondary | ICD-10-CM | POA: Diagnosis not present

## 2013-07-20 DIAGNOSIS — N186 End stage renal disease: Secondary | ICD-10-CM | POA: Diagnosis not present

## 2013-07-20 DIAGNOSIS — N2581 Secondary hyperparathyroidism of renal origin: Secondary | ICD-10-CM | POA: Diagnosis not present

## 2013-07-20 DIAGNOSIS — D509 Iron deficiency anemia, unspecified: Secondary | ICD-10-CM | POA: Diagnosis not present

## 2013-07-20 DIAGNOSIS — N039 Chronic nephritic syndrome with unspecified morphologic changes: Secondary | ICD-10-CM | POA: Diagnosis not present

## 2013-07-20 DIAGNOSIS — D631 Anemia in chronic kidney disease: Secondary | ICD-10-CM | POA: Diagnosis not present

## 2013-07-22 DIAGNOSIS — N039 Chronic nephritic syndrome with unspecified morphologic changes: Secondary | ICD-10-CM | POA: Diagnosis not present

## 2013-07-22 DIAGNOSIS — D509 Iron deficiency anemia, unspecified: Secondary | ICD-10-CM | POA: Diagnosis not present

## 2013-07-22 DIAGNOSIS — N186 End stage renal disease: Secondary | ICD-10-CM | POA: Diagnosis not present

## 2013-07-22 DIAGNOSIS — D631 Anemia in chronic kidney disease: Secondary | ICD-10-CM | POA: Diagnosis not present

## 2013-07-22 DIAGNOSIS — N2581 Secondary hyperparathyroidism of renal origin: Secondary | ICD-10-CM | POA: Diagnosis not present

## 2013-07-25 DIAGNOSIS — D631 Anemia in chronic kidney disease: Secondary | ICD-10-CM | POA: Diagnosis not present

## 2013-07-25 DIAGNOSIS — D509 Iron deficiency anemia, unspecified: Secondary | ICD-10-CM | POA: Diagnosis not present

## 2013-07-25 DIAGNOSIS — N186 End stage renal disease: Secondary | ICD-10-CM | POA: Diagnosis not present

## 2013-07-25 DIAGNOSIS — N2581 Secondary hyperparathyroidism of renal origin: Secondary | ICD-10-CM | POA: Diagnosis not present

## 2013-07-27 DIAGNOSIS — N039 Chronic nephritic syndrome with unspecified morphologic changes: Secondary | ICD-10-CM | POA: Diagnosis not present

## 2013-07-27 DIAGNOSIS — D509 Iron deficiency anemia, unspecified: Secondary | ICD-10-CM | POA: Diagnosis not present

## 2013-07-27 DIAGNOSIS — D631 Anemia in chronic kidney disease: Secondary | ICD-10-CM | POA: Diagnosis not present

## 2013-07-27 DIAGNOSIS — N2581 Secondary hyperparathyroidism of renal origin: Secondary | ICD-10-CM | POA: Diagnosis not present

## 2013-07-27 DIAGNOSIS — N186 End stage renal disease: Secondary | ICD-10-CM | POA: Diagnosis not present

## 2013-07-29 DIAGNOSIS — N039 Chronic nephritic syndrome with unspecified morphologic changes: Secondary | ICD-10-CM | POA: Diagnosis not present

## 2013-07-29 DIAGNOSIS — D509 Iron deficiency anemia, unspecified: Secondary | ICD-10-CM | POA: Diagnosis not present

## 2013-07-29 DIAGNOSIS — N186 End stage renal disease: Secondary | ICD-10-CM | POA: Diagnosis not present

## 2013-07-29 DIAGNOSIS — D631 Anemia in chronic kidney disease: Secondary | ICD-10-CM | POA: Diagnosis not present

## 2013-07-29 DIAGNOSIS — N2581 Secondary hyperparathyroidism of renal origin: Secondary | ICD-10-CM | POA: Diagnosis not present

## 2013-08-01 DIAGNOSIS — D509 Iron deficiency anemia, unspecified: Secondary | ICD-10-CM | POA: Diagnosis not present

## 2013-08-01 DIAGNOSIS — D631 Anemia in chronic kidney disease: Secondary | ICD-10-CM | POA: Diagnosis not present

## 2013-08-01 DIAGNOSIS — N039 Chronic nephritic syndrome with unspecified morphologic changes: Secondary | ICD-10-CM | POA: Diagnosis not present

## 2013-08-01 DIAGNOSIS — N186 End stage renal disease: Secondary | ICD-10-CM | POA: Diagnosis not present

## 2013-08-01 DIAGNOSIS — N2581 Secondary hyperparathyroidism of renal origin: Secondary | ICD-10-CM | POA: Diagnosis not present

## 2013-08-02 DIAGNOSIS — I10 Essential (primary) hypertension: Secondary | ICD-10-CM | POA: Diagnosis not present

## 2013-08-02 DIAGNOSIS — R569 Unspecified convulsions: Secondary | ICD-10-CM | POA: Diagnosis not present

## 2013-08-02 DIAGNOSIS — N19 Unspecified kidney failure: Secondary | ICD-10-CM | POA: Diagnosis not present

## 2013-08-03 DIAGNOSIS — D509 Iron deficiency anemia, unspecified: Secondary | ICD-10-CM | POA: Diagnosis not present

## 2013-08-03 DIAGNOSIS — N2581 Secondary hyperparathyroidism of renal origin: Secondary | ICD-10-CM | POA: Diagnosis not present

## 2013-08-03 DIAGNOSIS — N186 End stage renal disease: Secondary | ICD-10-CM | POA: Diagnosis not present

## 2013-08-03 DIAGNOSIS — D631 Anemia in chronic kidney disease: Secondary | ICD-10-CM | POA: Diagnosis not present

## 2013-08-05 DIAGNOSIS — N186 End stage renal disease: Secondary | ICD-10-CM | POA: Diagnosis not present

## 2013-08-05 DIAGNOSIS — N2581 Secondary hyperparathyroidism of renal origin: Secondary | ICD-10-CM | POA: Diagnosis not present

## 2013-08-05 DIAGNOSIS — D631 Anemia in chronic kidney disease: Secondary | ICD-10-CM | POA: Diagnosis not present

## 2013-08-05 DIAGNOSIS — D509 Iron deficiency anemia, unspecified: Secondary | ICD-10-CM | POA: Diagnosis not present

## 2013-08-08 DIAGNOSIS — D509 Iron deficiency anemia, unspecified: Secondary | ICD-10-CM | POA: Diagnosis not present

## 2013-08-08 DIAGNOSIS — N2581 Secondary hyperparathyroidism of renal origin: Secondary | ICD-10-CM | POA: Diagnosis not present

## 2013-08-08 DIAGNOSIS — N186 End stage renal disease: Secondary | ICD-10-CM | POA: Diagnosis not present

## 2013-08-08 DIAGNOSIS — D631 Anemia in chronic kidney disease: Secondary | ICD-10-CM | POA: Diagnosis not present

## 2013-08-10 DIAGNOSIS — N2581 Secondary hyperparathyroidism of renal origin: Secondary | ICD-10-CM | POA: Diagnosis not present

## 2013-08-10 DIAGNOSIS — N186 End stage renal disease: Secondary | ICD-10-CM | POA: Diagnosis not present

## 2013-08-10 DIAGNOSIS — D509 Iron deficiency anemia, unspecified: Secondary | ICD-10-CM | POA: Diagnosis not present

## 2013-08-10 DIAGNOSIS — D631 Anemia in chronic kidney disease: Secondary | ICD-10-CM | POA: Diagnosis not present

## 2013-08-12 DIAGNOSIS — D509 Iron deficiency anemia, unspecified: Secondary | ICD-10-CM | POA: Diagnosis not present

## 2013-08-12 DIAGNOSIS — N186 End stage renal disease: Secondary | ICD-10-CM | POA: Diagnosis not present

## 2013-08-12 DIAGNOSIS — N2581 Secondary hyperparathyroidism of renal origin: Secondary | ICD-10-CM | POA: Diagnosis not present

## 2013-08-12 DIAGNOSIS — N039 Chronic nephritic syndrome with unspecified morphologic changes: Secondary | ICD-10-CM | POA: Diagnosis not present

## 2013-08-12 DIAGNOSIS — D631 Anemia in chronic kidney disease: Secondary | ICD-10-CM | POA: Diagnosis not present

## 2013-08-14 DIAGNOSIS — N186 End stage renal disease: Secondary | ICD-10-CM | POA: Diagnosis not present

## 2013-08-15 DIAGNOSIS — N2581 Secondary hyperparathyroidism of renal origin: Secondary | ICD-10-CM | POA: Diagnosis not present

## 2013-08-15 DIAGNOSIS — N186 End stage renal disease: Secondary | ICD-10-CM | POA: Diagnosis not present

## 2013-08-15 DIAGNOSIS — D631 Anemia in chronic kidney disease: Secondary | ICD-10-CM | POA: Diagnosis not present

## 2013-08-15 DIAGNOSIS — D509 Iron deficiency anemia, unspecified: Secondary | ICD-10-CM | POA: Diagnosis not present

## 2013-08-17 DIAGNOSIS — D631 Anemia in chronic kidney disease: Secondary | ICD-10-CM | POA: Diagnosis not present

## 2013-08-17 DIAGNOSIS — N2581 Secondary hyperparathyroidism of renal origin: Secondary | ICD-10-CM | POA: Diagnosis not present

## 2013-08-17 DIAGNOSIS — D509 Iron deficiency anemia, unspecified: Secondary | ICD-10-CM | POA: Diagnosis not present

## 2013-08-17 DIAGNOSIS — N186 End stage renal disease: Secondary | ICD-10-CM | POA: Diagnosis not present

## 2013-08-19 DIAGNOSIS — N186 End stage renal disease: Secondary | ICD-10-CM | POA: Diagnosis not present

## 2013-08-19 DIAGNOSIS — D509 Iron deficiency anemia, unspecified: Secondary | ICD-10-CM | POA: Diagnosis not present

## 2013-08-19 DIAGNOSIS — N2581 Secondary hyperparathyroidism of renal origin: Secondary | ICD-10-CM | POA: Diagnosis not present

## 2013-08-19 DIAGNOSIS — D631 Anemia in chronic kidney disease: Secondary | ICD-10-CM | POA: Diagnosis not present

## 2013-08-22 DIAGNOSIS — D509 Iron deficiency anemia, unspecified: Secondary | ICD-10-CM | POA: Diagnosis not present

## 2013-08-22 DIAGNOSIS — D631 Anemia in chronic kidney disease: Secondary | ICD-10-CM | POA: Diagnosis not present

## 2013-08-22 DIAGNOSIS — N2581 Secondary hyperparathyroidism of renal origin: Secondary | ICD-10-CM | POA: Diagnosis not present

## 2013-08-22 DIAGNOSIS — N186 End stage renal disease: Secondary | ICD-10-CM | POA: Diagnosis not present

## 2013-08-24 DIAGNOSIS — N039 Chronic nephritic syndrome with unspecified morphologic changes: Secondary | ICD-10-CM | POA: Diagnosis not present

## 2013-08-24 DIAGNOSIS — N2581 Secondary hyperparathyroidism of renal origin: Secondary | ICD-10-CM | POA: Diagnosis not present

## 2013-08-24 DIAGNOSIS — D631 Anemia in chronic kidney disease: Secondary | ICD-10-CM | POA: Diagnosis not present

## 2013-08-24 DIAGNOSIS — N186 End stage renal disease: Secondary | ICD-10-CM | POA: Diagnosis not present

## 2013-08-24 DIAGNOSIS — D509 Iron deficiency anemia, unspecified: Secondary | ICD-10-CM | POA: Diagnosis not present

## 2013-08-26 DIAGNOSIS — D631 Anemia in chronic kidney disease: Secondary | ICD-10-CM | POA: Diagnosis not present

## 2013-08-26 DIAGNOSIS — N2581 Secondary hyperparathyroidism of renal origin: Secondary | ICD-10-CM | POA: Diagnosis not present

## 2013-08-26 DIAGNOSIS — D509 Iron deficiency anemia, unspecified: Secondary | ICD-10-CM | POA: Diagnosis not present

## 2013-08-26 DIAGNOSIS — N186 End stage renal disease: Secondary | ICD-10-CM | POA: Diagnosis not present

## 2013-08-29 DIAGNOSIS — D631 Anemia in chronic kidney disease: Secondary | ICD-10-CM | POA: Diagnosis not present

## 2013-08-29 DIAGNOSIS — N186 End stage renal disease: Secondary | ICD-10-CM | POA: Diagnosis not present

## 2013-08-29 DIAGNOSIS — D509 Iron deficiency anemia, unspecified: Secondary | ICD-10-CM | POA: Diagnosis not present

## 2013-08-29 DIAGNOSIS — N2581 Secondary hyperparathyroidism of renal origin: Secondary | ICD-10-CM | POA: Diagnosis not present

## 2013-08-31 DIAGNOSIS — D631 Anemia in chronic kidney disease: Secondary | ICD-10-CM | POA: Diagnosis not present

## 2013-08-31 DIAGNOSIS — D509 Iron deficiency anemia, unspecified: Secondary | ICD-10-CM | POA: Diagnosis not present

## 2013-08-31 DIAGNOSIS — N039 Chronic nephritic syndrome with unspecified morphologic changes: Secondary | ICD-10-CM | POA: Diagnosis not present

## 2013-08-31 DIAGNOSIS — N2581 Secondary hyperparathyroidism of renal origin: Secondary | ICD-10-CM | POA: Diagnosis not present

## 2013-08-31 DIAGNOSIS — N186 End stage renal disease: Secondary | ICD-10-CM | POA: Diagnosis not present

## 2013-09-02 DIAGNOSIS — N2581 Secondary hyperparathyroidism of renal origin: Secondary | ICD-10-CM | POA: Diagnosis not present

## 2013-09-02 DIAGNOSIS — D631 Anemia in chronic kidney disease: Secondary | ICD-10-CM | POA: Diagnosis not present

## 2013-09-02 DIAGNOSIS — D509 Iron deficiency anemia, unspecified: Secondary | ICD-10-CM | POA: Diagnosis not present

## 2013-09-02 DIAGNOSIS — N186 End stage renal disease: Secondary | ICD-10-CM | POA: Diagnosis not present

## 2013-09-05 DIAGNOSIS — N186 End stage renal disease: Secondary | ICD-10-CM | POA: Diagnosis not present

## 2013-09-05 DIAGNOSIS — N039 Chronic nephritic syndrome with unspecified morphologic changes: Secondary | ICD-10-CM | POA: Diagnosis not present

## 2013-09-05 DIAGNOSIS — D631 Anemia in chronic kidney disease: Secondary | ICD-10-CM | POA: Diagnosis not present

## 2013-09-05 DIAGNOSIS — N2581 Secondary hyperparathyroidism of renal origin: Secondary | ICD-10-CM | POA: Diagnosis not present

## 2013-09-05 DIAGNOSIS — D509 Iron deficiency anemia, unspecified: Secondary | ICD-10-CM | POA: Diagnosis not present

## 2013-09-07 DIAGNOSIS — D509 Iron deficiency anemia, unspecified: Secondary | ICD-10-CM | POA: Diagnosis not present

## 2013-09-07 DIAGNOSIS — N186 End stage renal disease: Secondary | ICD-10-CM | POA: Diagnosis not present

## 2013-09-07 DIAGNOSIS — D631 Anemia in chronic kidney disease: Secondary | ICD-10-CM | POA: Diagnosis not present

## 2013-09-07 DIAGNOSIS — N2581 Secondary hyperparathyroidism of renal origin: Secondary | ICD-10-CM | POA: Diagnosis not present

## 2013-09-07 DIAGNOSIS — N039 Chronic nephritic syndrome with unspecified morphologic changes: Secondary | ICD-10-CM | POA: Diagnosis not present

## 2013-09-09 DIAGNOSIS — N186 End stage renal disease: Secondary | ICD-10-CM | POA: Diagnosis not present

## 2013-09-09 DIAGNOSIS — D509 Iron deficiency anemia, unspecified: Secondary | ICD-10-CM | POA: Diagnosis not present

## 2013-09-09 DIAGNOSIS — D631 Anemia in chronic kidney disease: Secondary | ICD-10-CM | POA: Diagnosis not present

## 2013-09-09 DIAGNOSIS — N2581 Secondary hyperparathyroidism of renal origin: Secondary | ICD-10-CM | POA: Diagnosis not present

## 2013-09-12 DIAGNOSIS — D631 Anemia in chronic kidney disease: Secondary | ICD-10-CM | POA: Diagnosis not present

## 2013-09-12 DIAGNOSIS — N186 End stage renal disease: Secondary | ICD-10-CM | POA: Diagnosis not present

## 2013-09-12 DIAGNOSIS — D509 Iron deficiency anemia, unspecified: Secondary | ICD-10-CM | POA: Diagnosis not present

## 2013-09-12 DIAGNOSIS — N2581 Secondary hyperparathyroidism of renal origin: Secondary | ICD-10-CM | POA: Diagnosis not present

## 2013-09-13 DIAGNOSIS — N186 End stage renal disease: Secondary | ICD-10-CM | POA: Diagnosis not present

## 2013-09-14 DIAGNOSIS — N2581 Secondary hyperparathyroidism of renal origin: Secondary | ICD-10-CM | POA: Diagnosis not present

## 2013-09-14 DIAGNOSIS — N039 Chronic nephritic syndrome with unspecified morphologic changes: Secondary | ICD-10-CM | POA: Diagnosis not present

## 2013-09-14 DIAGNOSIS — D509 Iron deficiency anemia, unspecified: Secondary | ICD-10-CM | POA: Diagnosis not present

## 2013-09-14 DIAGNOSIS — D631 Anemia in chronic kidney disease: Secondary | ICD-10-CM | POA: Diagnosis not present

## 2013-09-14 DIAGNOSIS — N186 End stage renal disease: Secondary | ICD-10-CM | POA: Diagnosis not present

## 2013-09-21 DIAGNOSIS — N186 End stage renal disease: Secondary | ICD-10-CM | POA: Diagnosis not present

## 2013-09-21 DIAGNOSIS — E119 Type 2 diabetes mellitus without complications: Secondary | ICD-10-CM | POA: Diagnosis not present

## 2013-10-14 DIAGNOSIS — N186 End stage renal disease: Secondary | ICD-10-CM | POA: Diagnosis not present

## 2013-10-17 DIAGNOSIS — N186 End stage renal disease: Secondary | ICD-10-CM | POA: Diagnosis not present

## 2013-10-17 DIAGNOSIS — N039 Chronic nephritic syndrome with unspecified morphologic changes: Secondary | ICD-10-CM | POA: Diagnosis not present

## 2013-10-17 DIAGNOSIS — N2581 Secondary hyperparathyroidism of renal origin: Secondary | ICD-10-CM | POA: Diagnosis not present

## 2013-10-17 DIAGNOSIS — D509 Iron deficiency anemia, unspecified: Secondary | ICD-10-CM | POA: Diagnosis not present

## 2013-10-17 DIAGNOSIS — D631 Anemia in chronic kidney disease: Secondary | ICD-10-CM | POA: Diagnosis not present

## 2013-10-18 DIAGNOSIS — T82898A Other specified complication of vascular prosthetic devices, implants and grafts, initial encounter: Secondary | ICD-10-CM | POA: Diagnosis not present

## 2013-10-18 DIAGNOSIS — I10 Essential (primary) hypertension: Secondary | ICD-10-CM | POA: Diagnosis not present

## 2013-10-18 DIAGNOSIS — N186 End stage renal disease: Secondary | ICD-10-CM | POA: Diagnosis not present

## 2013-10-19 DIAGNOSIS — N186 End stage renal disease: Secondary | ICD-10-CM | POA: Diagnosis not present

## 2013-10-19 DIAGNOSIS — N039 Chronic nephritic syndrome with unspecified morphologic changes: Secondary | ICD-10-CM | POA: Diagnosis not present

## 2013-10-19 DIAGNOSIS — N2581 Secondary hyperparathyroidism of renal origin: Secondary | ICD-10-CM | POA: Diagnosis not present

## 2013-10-19 DIAGNOSIS — D509 Iron deficiency anemia, unspecified: Secondary | ICD-10-CM | POA: Diagnosis not present

## 2013-10-19 DIAGNOSIS — D631 Anemia in chronic kidney disease: Secondary | ICD-10-CM | POA: Diagnosis not present

## 2013-10-21 DIAGNOSIS — N2581 Secondary hyperparathyroidism of renal origin: Secondary | ICD-10-CM | POA: Diagnosis not present

## 2013-10-21 DIAGNOSIS — D509 Iron deficiency anemia, unspecified: Secondary | ICD-10-CM | POA: Diagnosis not present

## 2013-10-21 DIAGNOSIS — N186 End stage renal disease: Secondary | ICD-10-CM | POA: Diagnosis not present

## 2013-10-21 DIAGNOSIS — D631 Anemia in chronic kidney disease: Secondary | ICD-10-CM | POA: Diagnosis not present

## 2013-10-24 DIAGNOSIS — D631 Anemia in chronic kidney disease: Secondary | ICD-10-CM | POA: Diagnosis not present

## 2013-10-24 DIAGNOSIS — D509 Iron deficiency anemia, unspecified: Secondary | ICD-10-CM | POA: Diagnosis not present

## 2013-10-24 DIAGNOSIS — N2581 Secondary hyperparathyroidism of renal origin: Secondary | ICD-10-CM | POA: Diagnosis not present

## 2013-10-24 DIAGNOSIS — N039 Chronic nephritic syndrome with unspecified morphologic changes: Secondary | ICD-10-CM | POA: Diagnosis not present

## 2013-10-24 DIAGNOSIS — N186 End stage renal disease: Secondary | ICD-10-CM | POA: Diagnosis not present

## 2013-10-26 DIAGNOSIS — D631 Anemia in chronic kidney disease: Secondary | ICD-10-CM | POA: Diagnosis not present

## 2013-10-26 DIAGNOSIS — D509 Iron deficiency anemia, unspecified: Secondary | ICD-10-CM | POA: Diagnosis not present

## 2013-10-26 DIAGNOSIS — N186 End stage renal disease: Secondary | ICD-10-CM | POA: Diagnosis not present

## 2013-10-26 DIAGNOSIS — N2581 Secondary hyperparathyroidism of renal origin: Secondary | ICD-10-CM | POA: Diagnosis not present

## 2013-10-28 DIAGNOSIS — N186 End stage renal disease: Secondary | ICD-10-CM | POA: Diagnosis not present

## 2013-10-28 DIAGNOSIS — D509 Iron deficiency anemia, unspecified: Secondary | ICD-10-CM | POA: Diagnosis not present

## 2013-10-28 DIAGNOSIS — D631 Anemia in chronic kidney disease: Secondary | ICD-10-CM | POA: Diagnosis not present

## 2013-10-28 DIAGNOSIS — N2581 Secondary hyperparathyroidism of renal origin: Secondary | ICD-10-CM | POA: Diagnosis not present

## 2013-10-31 DIAGNOSIS — N039 Chronic nephritic syndrome with unspecified morphologic changes: Secondary | ICD-10-CM | POA: Diagnosis not present

## 2013-10-31 DIAGNOSIS — N2581 Secondary hyperparathyroidism of renal origin: Secondary | ICD-10-CM | POA: Diagnosis not present

## 2013-10-31 DIAGNOSIS — D631 Anemia in chronic kidney disease: Secondary | ICD-10-CM | POA: Diagnosis not present

## 2013-10-31 DIAGNOSIS — N186 End stage renal disease: Secondary | ICD-10-CM | POA: Diagnosis not present

## 2013-10-31 DIAGNOSIS — D509 Iron deficiency anemia, unspecified: Secondary | ICD-10-CM | POA: Diagnosis not present

## 2013-11-02 DIAGNOSIS — N19 Unspecified kidney failure: Secondary | ICD-10-CM | POA: Diagnosis not present

## 2013-11-02 DIAGNOSIS — D631 Anemia in chronic kidney disease: Secondary | ICD-10-CM | POA: Diagnosis not present

## 2013-11-02 DIAGNOSIS — N2581 Secondary hyperparathyroidism of renal origin: Secondary | ICD-10-CM | POA: Diagnosis not present

## 2013-11-02 DIAGNOSIS — D509 Iron deficiency anemia, unspecified: Secondary | ICD-10-CM | POA: Diagnosis not present

## 2013-11-02 DIAGNOSIS — M329 Systemic lupus erythematosus, unspecified: Secondary | ICD-10-CM | POA: Diagnosis not present

## 2013-11-02 DIAGNOSIS — N039 Chronic nephritic syndrome with unspecified morphologic changes: Secondary | ICD-10-CM | POA: Diagnosis not present

## 2013-11-02 DIAGNOSIS — R569 Unspecified convulsions: Secondary | ICD-10-CM | POA: Diagnosis not present

## 2013-11-02 DIAGNOSIS — N186 End stage renal disease: Secondary | ICD-10-CM | POA: Diagnosis not present

## 2013-11-04 DIAGNOSIS — N2581 Secondary hyperparathyroidism of renal origin: Secondary | ICD-10-CM | POA: Diagnosis not present

## 2013-11-04 DIAGNOSIS — D509 Iron deficiency anemia, unspecified: Secondary | ICD-10-CM | POA: Diagnosis not present

## 2013-11-04 DIAGNOSIS — D631 Anemia in chronic kidney disease: Secondary | ICD-10-CM | POA: Diagnosis not present

## 2013-11-04 DIAGNOSIS — N186 End stage renal disease: Secondary | ICD-10-CM | POA: Diagnosis not present

## 2013-11-07 DIAGNOSIS — D631 Anemia in chronic kidney disease: Secondary | ICD-10-CM | POA: Diagnosis not present

## 2013-11-07 DIAGNOSIS — N2581 Secondary hyperparathyroidism of renal origin: Secondary | ICD-10-CM | POA: Diagnosis not present

## 2013-11-07 DIAGNOSIS — D509 Iron deficiency anemia, unspecified: Secondary | ICD-10-CM | POA: Diagnosis not present

## 2013-11-07 DIAGNOSIS — N186 End stage renal disease: Secondary | ICD-10-CM | POA: Diagnosis not present

## 2013-11-08 ENCOUNTER — Other Ambulatory Visit (HOSPITAL_COMMUNITY)
Admission: RE | Admit: 2013-11-08 | Discharge: 2013-11-08 | Disposition: A | Discharge status: Home / Self Care | Payer: Medicare Other | Source: Ambulatory Visit | Attending: Adult Health | Admitting: Adult Health

## 2013-11-08 ENCOUNTER — Encounter: Payer: Self-pay | Admitting: Adult Health

## 2013-11-08 ENCOUNTER — Ambulatory Visit (INDEPENDENT_AMBULATORY_CARE_PROVIDER_SITE_OTHER): Payer: Medicare Other | Admitting: Adult Health

## 2013-11-08 VITALS — BP 120/70 | HR 72 | Ht 62.5 in | Wt 89.5 lb

## 2013-11-08 DIAGNOSIS — N898 Other specified noninflammatory disorders of vagina: Secondary | ICD-10-CM

## 2013-11-08 DIAGNOSIS — N760 Acute vaginitis: Secondary | ICD-10-CM

## 2013-11-08 DIAGNOSIS — B9689 Other specified bacterial agents as the cause of diseases classified elsewhere: Secondary | ICD-10-CM | POA: Diagnosis not present

## 2013-11-08 DIAGNOSIS — N9489 Other specified conditions associated with female genital organs and menstrual cycle: Secondary | ICD-10-CM

## 2013-11-08 DIAGNOSIS — Z0000 Encounter for general adult medical examination without abnormal findings: Secondary | ICD-10-CM

## 2013-11-08 DIAGNOSIS — Z124 Encounter for screening for malignant neoplasm of cervix: Secondary | ICD-10-CM

## 2013-11-08 DIAGNOSIS — Z01419 Encounter for gynecological examination (general) (routine) without abnormal findings: Secondary | ICD-10-CM

## 2013-11-08 DIAGNOSIS — A499 Bacterial infection, unspecified: Secondary | ICD-10-CM | POA: Diagnosis not present

## 2013-11-08 DIAGNOSIS — Z1151 Encounter for screening for human papillomavirus (HPV): Secondary | ICD-10-CM | POA: Diagnosis not present

## 2013-11-08 HISTORY — DX: Other specified bacterial agents as the cause of diseases classified elsewhere: B96.89

## 2013-11-08 HISTORY — DX: Other specified bacterial agents as the cause of diseases classified elsewhere: N76.0

## 2013-11-08 HISTORY — DX: Other specified noninflammatory disorders of vagina: N89.8

## 2013-11-08 LAB — POCT WET PREP (WET MOUNT)

## 2013-11-08 MED ORDER — METRONIDAZOLE 500 MG PO TABS: 500.0000 mg | ORAL_TABLET | Freq: Two times a day (BID) | ORAL | Status: DC

## 2013-11-08 NOTE — Patient Instructions (Addendum)
Bacterial Vaginosis Bacterial vaginosis is a vaginal infection that occurs when the normal balance of bacteria in the vagina is disrupted. It results from an overgrowth of certain bacteria. This is the most common vaginal infection in women of childbearing age. Treatment is important to prevent complications, especially in pregnant women, as it can cause a premature delivery. CAUSES  Bacterial vaginosis is caused by an increase in harmful bacteria that are normally present in smaller amounts in the vagina. Several different kinds of bacteria can cause bacterial vaginosis. However, the reason that the condition develops is not fully understood. RISK FACTORS Certain activities or behaviors can put you at an increased risk of developing bacterial vaginosis, including:  Having a new sex partner or multiple sex partners.  Douching.  Using an intrauterine device (IUD) for contraception. Women do not get bacterial vaginosis from toilet seats, bedding, swimming pools, or contact with objects around them. SIGNS AND SYMPTOMS  Some women with bacterial vaginosis have no signs or symptoms. Common symptoms include:  Grey vaginal discharge.  A fishlike odor with discharge, especially after sexual intercourse.  Itching or burning of the vagina and vulva.  Burning or pain with urination. DIAGNOSIS  Your health care provider will take a medical history and examine the vagina for signs of bacterial vaginosis. A sample of vaginal fluid may be taken. Your health care provider will look at this sample under a microscope to check for bacteria and abnormal cells. A vaginal pH test may also be done.  TREATMENT  Bacterial vaginosis may be treated with antibiotic medicines. These may be given in the form of a pill or a vaginal cream. A second round of antibiotics may be prescribed if the condition comes back after treatment.  HOME CARE INSTRUCTIONS   Only take over-the-counter or prescription medicines as  directed by your health care provider.  If antibiotic medicine was prescribed, take it as directed. Make sure you finish it even if you start to feel better.  Do not have sex until treatment is completed.  Tell all sexual partners that you have a vaginal infection. They should see their health care provider and be treated if they have problems, such as a mild rash or itching.  Practice safe sex by using condoms and only having one sex partner. SEEK MEDICAL CARE IF:   Your symptoms are not improving after 3 days of treatment.  You have increased discharge or pain.  You have a fever. MAKE SURE YOU:   Understand these instructions.  Will watch your condition.  Will get help right away if you are not doing well or get worse. FOR MORE INFORMATION  Centers for Disease Control and Prevention, Division of STD Prevention: Appraiser[employer]raud[employer] American Sexual Health Association (ASHA): www.ashastd.org  Document Released: 03/03/2005 Document Revised: 12/22/2012 Document Reviewed: 10/13/2012 [facility] Patient Information 2015 [place], [place]. This information is not intended to replace advice given to you by your health care provider. Make sure you discuss any questions you have with your health care provider. Take flagyl 500 mg 1 bid x 7 days no alcohol

## 2013-11-08 NOTE — Addendum Note (Signed)
Addended by: Derrek Monaco A on: 11/08/2013 10:26 AM   Modules accepted: Level of Service

## 2013-11-08 NOTE — Progress Notes (Signed)
Patient ID: [name], female   DOB: May 23, 1975, 38 y.o.   MRN: [id number] History of Present Illness: [name] is a 38 year old black female,single,lives at [facility], in for pap and physical.Has vaginal odor.   Current Medications, Allergies, Past Medical History, Past Surgical History, Family History and Social History were reviewed in [employer] record.   Past Medical History  Diagnosis Date  . Lupus   . Renal disorder   . Dialysis patient   . Anemia   . Vaginal odor 11/08/2013  . BV (bacterial vaginosis) 11/08/2013  Current outpatient prescriptions:albuterol (PROVENTIL) (2.5 MG/3ML) 0.083% nebulizer solution, Take 2.5 mg by nebulization 4 (four) times daily as needed (breathing)., Disp: , Rfl: ;  ALPRAZolam (XANAX) 0.25 MG tablet, Take 0.25 mg by mouth 2 (two) times daily as needed for anxiety. , Disp: , Rfl: ;  aspirin EC 81 MG tablet, Take 81 mg by mouth daily., Disp: , Rfl:  chlorhexidine (PERIDEX) 0.12 % solution, Use as directed 10 mLs in the mouth or throat 2 (two) times daily., Disp: , Rfl: ;  clopidogrel (PLAVIX) 75 MG tablet, Take 75 mg by mouth daily., Disp: , Rfl: ;  docusate sodium (COLACE) 100 MG capsule, Take 100 mg by mouth daily., Disp: , Rfl: ;  epoetin alfa (EPOGEN,PROCRIT) 91478 UNIT/ML injection, Inject 4,000 Units into the skin 3 (three) times a week. Mondays, Wednesdays, Fridays, Disp: , Rfl:  guaiFENesin (ROBITUSSIN) 100 MG/5ML liquid, Take 200 mg by mouth 3 (three) times daily as needed for cough., Disp: , Rfl: ;  HYDROcodone-acetaminophen (NORCO/VICODIN) 5-325 MG per tablet, Take 2 tablets by mouth every 4 (four) hours as needed., Disp: 10 tablet, Rfl: 0;  hydroxychloroquine (PLAQUENIL) 200 MG tablet, Take 200 mg by mouth daily., Disp: , Rfl:  ipratropium (ATROVENT) 0.02 % nebulizer solution, Take 250 mcg by nebulization 4 (four) times daily as needed (breathing)., Disp: , Rfl: ;  lidocaine-prilocaine (EMLA) cream, Apply 1  application topically as needed (for port access). , Disp: , Rfl: ;  loratadine (CLARITIN) 10 MG tablet, Take 10 mg by mouth daily as needed for allergies., Disp: , Rfl:  metroNIDAZOLE (FLAGYL) 500 MG tablet, Take 1 tablet (500 mg total) by mouth 2 (two) times daily., Disp: 14 tablet, Rfl: 0;  midodrine (PROAMATINE) 10 MG tablet, Take 10 mg by mouth 2 (two) times daily. Monday, Wednesday, and Friday before dialysis, Disp: , Rfl: ;  mirtazapine (REMERON) 15 MG tablet, , Disp: , Rfl: ;  multivitamin (RENA-VIT) TABS tablet, Take 1 tablet by mouth daily., Disp: , Rfl:  multivitamin (RENA-VIT) TABS tablet, Take 1 tablet by mouth daily., Disp: , Rfl: ;  ondansetron (ZOFRAN) 8 MG tablet, Take by mouth as needed for nausea or vomiting., Disp: , Rfl: ;  pantoprazole (PROTONIX) 40 MG tablet, Take 40 mg by mouth daily., Disp: , Rfl: ;  phenytoin (DILANTIN) 100 MG ER capsule, Take 200 mg by mouth 2 (two) times daily. , Disp: , Rfl:  polyethylene glycol (MIRALAX / GLYCOLAX) packet, Take 17 g by mouth daily as needed for mild constipation., Disp: , Rfl: ;  sertraline (ZOLOFT) 100 MG tablet, Take 200 mg by mouth 2 (two) times daily. , Disp: , Rfl: ;  sevelamer carbonate (RENVELA) 800 MG tablet, Take 800-1,600 mg by mouth 4 (four) times daily. Takes 1600 mg with each meal and 800 mg with snack, Disp: , Rfl:  simvastatin (ZOCOR) 40 MG tablet, Take 40 mg by mouth daily., Disp: , Rfl:  No current facility-administered medications for this visit. Facility-Administered Medications Ordered in Other Visits: 0.9 %  sodium chloride infusion, , Intravenous, Once, Lavonia Drafts, PA-C;  ceFAZolin (ANCEF) IVPB 2 g/50 mL premix, 2 g, Intravenous, Once, Lavonia Drafts, PA-C  Review of Systems: Patient denies any headaches, blurred vision, shortness of breath, chest pain, abdominal pain, problems with bowel movements, urination, or intercourse.Not having sex.No joint pain or mood swings,see HPI for positives.     Physical Exam:BP  120/70  Pulse 72  Ht 5' 2.5" (1.588 m)  Wt 89 lb 8 oz (40.597 kg)  BMI 16.10 kg/m2  LMP 10/12/2013 General:  Well developed, well nourished, no acute distress,thin,child like Skin:  Warm and dry Neck:  Midline trachea, normal thyroid Lungs; Clear to auscultation bilaterally Breast:  No dominant palpable mass, retraction, or nipple discharge Cardiovascular: Regular rate and rhythm Abdomen:  Soft, non tender, no hepatosplenomegaly Pelvic:  External genitalia is normal in appearance.  The vagina has grayish discharge with odor. The cervix is smooth, pap with HPV performed.Marland Kitchen  Uterus is felt to be normal size, shape, and contour.  No      adnexal masses or tenderness noted.wet prep shows few WBCs and + clue cells. Extremities:  No swelling or varicosities noted Psych:  No mood changes,alert and cooperative, seems happy   Impression: Yearly gyn exam Vaginal discharge with odor BV    Plan: Rx flagyl 500 mg 1 bid x 7 days, no alcohol, review handout on BV   Physical in 1 year Pap in 3 if normal with negative HPV Mammogram at 39

## 2013-11-09 DIAGNOSIS — N2581 Secondary hyperparathyroidism of renal origin: Secondary | ICD-10-CM | POA: Diagnosis not present

## 2013-11-09 DIAGNOSIS — N186 End stage renal disease: Secondary | ICD-10-CM | POA: Diagnosis not present

## 2013-11-09 DIAGNOSIS — D509 Iron deficiency anemia, unspecified: Secondary | ICD-10-CM | POA: Diagnosis not present

## 2013-11-09 DIAGNOSIS — D631 Anemia in chronic kidney disease: Secondary | ICD-10-CM | POA: Diagnosis not present

## 2013-11-09 LAB — CYTOLOGY - PAP

## 2013-11-11 DIAGNOSIS — D631 Anemia in chronic kidney disease: Secondary | ICD-10-CM | POA: Diagnosis not present

## 2013-11-11 DIAGNOSIS — N039 Chronic nephritic syndrome with unspecified morphologic changes: Secondary | ICD-10-CM | POA: Diagnosis not present

## 2013-11-11 DIAGNOSIS — D509 Iron deficiency anemia, unspecified: Secondary | ICD-10-CM | POA: Diagnosis not present

## 2013-11-11 DIAGNOSIS — N186 End stage renal disease: Secondary | ICD-10-CM | POA: Diagnosis not present

## 2013-11-11 DIAGNOSIS — N2581 Secondary hyperparathyroidism of renal origin: Secondary | ICD-10-CM | POA: Diagnosis not present

## 2013-11-14 DIAGNOSIS — N186 End stage renal disease: Secondary | ICD-10-CM | POA: Diagnosis not present

## 2013-11-14 DIAGNOSIS — N2581 Secondary hyperparathyroidism of renal origin: Secondary | ICD-10-CM | POA: Diagnosis not present

## 2013-11-14 DIAGNOSIS — D509 Iron deficiency anemia, unspecified: Secondary | ICD-10-CM | POA: Diagnosis not present

## 2013-11-14 DIAGNOSIS — N039 Chronic nephritic syndrome with unspecified morphologic changes: Secondary | ICD-10-CM | POA: Diagnosis not present

## 2013-11-14 DIAGNOSIS — D631 Anemia in chronic kidney disease: Secondary | ICD-10-CM | POA: Diagnosis not present

## 2013-11-16 DIAGNOSIS — N186 End stage renal disease: Secondary | ICD-10-CM | POA: Diagnosis not present

## 2013-11-16 DIAGNOSIS — N2581 Secondary hyperparathyroidism of renal origin: Secondary | ICD-10-CM | POA: Diagnosis not present

## 2013-11-16 DIAGNOSIS — D509 Iron deficiency anemia, unspecified: Secondary | ICD-10-CM | POA: Diagnosis not present

## 2013-11-16 DIAGNOSIS — D631 Anemia in chronic kidney disease: Secondary | ICD-10-CM | POA: Diagnosis not present

## 2013-11-28 DIAGNOSIS — N186 End stage renal disease: Secondary | ICD-10-CM | POA: Diagnosis not present

## 2013-12-13 DIAGNOSIS — M329 Systemic lupus erythematosus, unspecified: Secondary | ICD-10-CM | POA: Diagnosis not present

## 2013-12-13 DIAGNOSIS — Z79899 Other long term (current) drug therapy: Secondary | ICD-10-CM | POA: Diagnosis not present

## 2013-12-14 DIAGNOSIS — N186 End stage renal disease: Secondary | ICD-10-CM | POA: Diagnosis not present

## 2013-12-15 ENCOUNTER — Ambulatory Visit: Payer: Self-pay | Admitting: Vascular Surgery

## 2013-12-15 DIAGNOSIS — T829XXA Unspecified complication of cardiac and vascular prosthetic device, implant and graft, initial encounter: Secondary | ICD-10-CM | POA: Diagnosis not present

## 2013-12-15 DIAGNOSIS — I120 Hypertensive chronic kidney disease with stage 5 chronic kidney disease or end stage renal disease: Secondary | ICD-10-CM | POA: Diagnosis not present

## 2013-12-15 DIAGNOSIS — T8249XS Other complication of vascular dialysis catheter, sequela: Secondary | ICD-10-CM | POA: Diagnosis not present

## 2013-12-15 DIAGNOSIS — N186 End stage renal disease: Secondary | ICD-10-CM | POA: Diagnosis not present

## 2013-12-15 DIAGNOSIS — Z992 Dependence on renal dialysis: Secondary | ICD-10-CM | POA: Diagnosis not present

## 2013-12-15 DIAGNOSIS — I10 Essential (primary) hypertension: Secondary | ICD-10-CM | POA: Diagnosis not present

## 2013-12-16 DIAGNOSIS — N186 End stage renal disease: Secondary | ICD-10-CM | POA: Diagnosis not present

## 2013-12-16 DIAGNOSIS — Z23 Encounter for immunization: Secondary | ICD-10-CM | POA: Diagnosis not present

## 2013-12-16 DIAGNOSIS — Z992 Dependence on renal dialysis: Secondary | ICD-10-CM | POA: Diagnosis not present

## 2013-12-16 DIAGNOSIS — D509 Iron deficiency anemia, unspecified: Secondary | ICD-10-CM | POA: Diagnosis not present

## 2013-12-16 DIAGNOSIS — N2581 Secondary hyperparathyroidism of renal origin: Secondary | ICD-10-CM | POA: Diagnosis not present

## 2013-12-16 DIAGNOSIS — D631 Anemia in chronic kidney disease: Secondary | ICD-10-CM | POA: Diagnosis not present

## 2013-12-20 DIAGNOSIS — M7742 Metatarsalgia, left foot: Secondary | ICD-10-CM | POA: Diagnosis not present

## 2013-12-26 DIAGNOSIS — E119 Type 2 diabetes mellitus without complications: Secondary | ICD-10-CM | POA: Diagnosis not present

## 2013-12-30 DIAGNOSIS — N186 End stage renal disease: Secondary | ICD-10-CM | POA: Diagnosis not present

## 2013-12-30 DIAGNOSIS — Z992 Dependence on renal dialysis: Secondary | ICD-10-CM | POA: Diagnosis not present

## 2014-01-14 DIAGNOSIS — Z992 Dependence on renal dialysis: Secondary | ICD-10-CM | POA: Diagnosis not present

## 2014-01-14 DIAGNOSIS — N186 End stage renal disease: Secondary | ICD-10-CM | POA: Diagnosis not present

## 2014-01-16 DIAGNOSIS — N186 End stage renal disease: Secondary | ICD-10-CM | POA: Diagnosis not present

## 2014-01-16 DIAGNOSIS — D631 Anemia in chronic kidney disease: Secondary | ICD-10-CM | POA: Diagnosis not present

## 2014-01-16 DIAGNOSIS — Z992 Dependence on renal dialysis: Secondary | ICD-10-CM | POA: Diagnosis not present

## 2014-01-16 DIAGNOSIS — D509 Iron deficiency anemia, unspecified: Secondary | ICD-10-CM | POA: Diagnosis not present

## 2014-01-16 DIAGNOSIS — N2581 Secondary hyperparathyroidism of renal origin: Secondary | ICD-10-CM | POA: Diagnosis not present

## 2014-01-24 DIAGNOSIS — N186 End stage renal disease: Secondary | ICD-10-CM | POA: Diagnosis not present

## 2014-01-24 DIAGNOSIS — Y841 Kidney dialysis as the cause of abnormal reaction of the patient, or of later complication, without mention of misadventure at the time of the procedure: Secondary | ICD-10-CM | POA: Diagnosis not present

## 2014-01-31 DIAGNOSIS — J449 Chronic obstructive pulmonary disease, unspecified: Secondary | ICD-10-CM | POA: Diagnosis not present

## 2014-01-31 DIAGNOSIS — M329 Systemic lupus erythematosus, unspecified: Secondary | ICD-10-CM | POA: Diagnosis not present

## 2014-01-31 DIAGNOSIS — R569 Unspecified convulsions: Secondary | ICD-10-CM | POA: Diagnosis not present

## 2014-01-31 DIAGNOSIS — I120 Hypertensive chronic kidney disease with stage 5 chronic kidney disease or end stage renal disease: Secondary | ICD-10-CM | POA: Diagnosis not present

## 2014-02-13 DIAGNOSIS — N186 End stage renal disease: Secondary | ICD-10-CM | POA: Diagnosis not present

## 2014-02-13 DIAGNOSIS — Z992 Dependence on renal dialysis: Secondary | ICD-10-CM | POA: Diagnosis not present

## 2014-02-15 DIAGNOSIS — Z992 Dependence on renal dialysis: Secondary | ICD-10-CM | POA: Diagnosis not present

## 2014-02-15 DIAGNOSIS — D631 Anemia in chronic kidney disease: Secondary | ICD-10-CM | POA: Diagnosis not present

## 2014-02-15 DIAGNOSIS — N186 End stage renal disease: Secondary | ICD-10-CM | POA: Diagnosis not present

## 2014-02-15 DIAGNOSIS — N2581 Secondary hyperparathyroidism of renal origin: Secondary | ICD-10-CM | POA: Diagnosis not present

## 2014-02-15 DIAGNOSIS — D509 Iron deficiency anemia, unspecified: Secondary | ICD-10-CM | POA: Diagnosis not present

## 2014-02-17 DIAGNOSIS — Z992 Dependence on renal dialysis: Secondary | ICD-10-CM | POA: Diagnosis not present

## 2014-02-17 DIAGNOSIS — N2581 Secondary hyperparathyroidism of renal origin: Secondary | ICD-10-CM | POA: Diagnosis not present

## 2014-02-17 DIAGNOSIS — D631 Anemia in chronic kidney disease: Secondary | ICD-10-CM | POA: Diagnosis not present

## 2014-02-17 DIAGNOSIS — N186 End stage renal disease: Secondary | ICD-10-CM | POA: Diagnosis not present

## 2014-02-17 DIAGNOSIS — D509 Iron deficiency anemia, unspecified: Secondary | ICD-10-CM | POA: Diagnosis not present

## 2014-02-20 DIAGNOSIS — N2581 Secondary hyperparathyroidism of renal origin: Secondary | ICD-10-CM | POA: Diagnosis not present

## 2014-02-20 DIAGNOSIS — N186 End stage renal disease: Secondary | ICD-10-CM | POA: Diagnosis not present

## 2014-02-20 DIAGNOSIS — Z992 Dependence on renal dialysis: Secondary | ICD-10-CM | POA: Diagnosis not present

## 2014-02-20 DIAGNOSIS — D509 Iron deficiency anemia, unspecified: Secondary | ICD-10-CM | POA: Diagnosis not present

## 2014-02-20 DIAGNOSIS — D631 Anemia in chronic kidney disease: Secondary | ICD-10-CM | POA: Diagnosis not present

## 2014-02-22 DIAGNOSIS — N2581 Secondary hyperparathyroidism of renal origin: Secondary | ICD-10-CM | POA: Diagnosis not present

## 2014-02-22 DIAGNOSIS — D631 Anemia in chronic kidney disease: Secondary | ICD-10-CM | POA: Diagnosis not present

## 2014-02-22 DIAGNOSIS — N186 End stage renal disease: Secondary | ICD-10-CM | POA: Diagnosis not present

## 2014-02-22 DIAGNOSIS — D509 Iron deficiency anemia, unspecified: Secondary | ICD-10-CM | POA: Diagnosis not present

## 2014-02-22 DIAGNOSIS — Z992 Dependence on renal dialysis: Secondary | ICD-10-CM | POA: Diagnosis not present

## 2014-02-24 DIAGNOSIS — N186 End stage renal disease: Secondary | ICD-10-CM | POA: Diagnosis not present

## 2014-02-24 DIAGNOSIS — D631 Anemia in chronic kidney disease: Secondary | ICD-10-CM | POA: Diagnosis not present

## 2014-02-24 DIAGNOSIS — N2581 Secondary hyperparathyroidism of renal origin: Secondary | ICD-10-CM | POA: Diagnosis not present

## 2014-02-24 DIAGNOSIS — D509 Iron deficiency anemia, unspecified: Secondary | ICD-10-CM | POA: Diagnosis not present

## 2014-02-24 DIAGNOSIS — Z992 Dependence on renal dialysis: Secondary | ICD-10-CM | POA: Diagnosis not present

## 2014-02-27 DIAGNOSIS — D631 Anemia in chronic kidney disease: Secondary | ICD-10-CM | POA: Diagnosis not present

## 2014-02-27 DIAGNOSIS — N2581 Secondary hyperparathyroidism of renal origin: Secondary | ICD-10-CM | POA: Diagnosis not present

## 2014-02-27 DIAGNOSIS — D509 Iron deficiency anemia, unspecified: Secondary | ICD-10-CM | POA: Diagnosis not present

## 2014-02-27 DIAGNOSIS — N186 End stage renal disease: Secondary | ICD-10-CM | POA: Diagnosis not present

## 2014-02-27 DIAGNOSIS — Z992 Dependence on renal dialysis: Secondary | ICD-10-CM | POA: Diagnosis not present

## 2014-03-01 DIAGNOSIS — D509 Iron deficiency anemia, unspecified: Secondary | ICD-10-CM | POA: Diagnosis not present

## 2014-03-01 DIAGNOSIS — Z992 Dependence on renal dialysis: Secondary | ICD-10-CM | POA: Diagnosis not present

## 2014-03-01 DIAGNOSIS — D631 Anemia in chronic kidney disease: Secondary | ICD-10-CM | POA: Diagnosis not present

## 2014-03-01 DIAGNOSIS — N186 End stage renal disease: Secondary | ICD-10-CM | POA: Diagnosis not present

## 2014-03-01 DIAGNOSIS — N2581 Secondary hyperparathyroidism of renal origin: Secondary | ICD-10-CM | POA: Diagnosis not present

## 2014-03-03 DIAGNOSIS — D631 Anemia in chronic kidney disease: Secondary | ICD-10-CM | POA: Diagnosis not present

## 2014-03-03 DIAGNOSIS — D509 Iron deficiency anemia, unspecified: Secondary | ICD-10-CM | POA: Diagnosis not present

## 2014-03-03 DIAGNOSIS — N2581 Secondary hyperparathyroidism of renal origin: Secondary | ICD-10-CM | POA: Diagnosis not present

## 2014-03-03 DIAGNOSIS — Z992 Dependence on renal dialysis: Secondary | ICD-10-CM | POA: Diagnosis not present

## 2014-03-03 DIAGNOSIS — N186 End stage renal disease: Secondary | ICD-10-CM | POA: Diagnosis not present

## 2014-03-06 DIAGNOSIS — D509 Iron deficiency anemia, unspecified: Secondary | ICD-10-CM | POA: Diagnosis not present

## 2014-03-06 DIAGNOSIS — N186 End stage renal disease: Secondary | ICD-10-CM | POA: Diagnosis not present

## 2014-03-06 DIAGNOSIS — D631 Anemia in chronic kidney disease: Secondary | ICD-10-CM | POA: Diagnosis not present

## 2014-03-06 DIAGNOSIS — Z992 Dependence on renal dialysis: Secondary | ICD-10-CM | POA: Diagnosis not present

## 2014-03-06 DIAGNOSIS — N2581 Secondary hyperparathyroidism of renal origin: Secondary | ICD-10-CM | POA: Diagnosis not present

## 2014-03-08 DIAGNOSIS — N2581 Secondary hyperparathyroidism of renal origin: Secondary | ICD-10-CM | POA: Diagnosis not present

## 2014-03-08 DIAGNOSIS — D509 Iron deficiency anemia, unspecified: Secondary | ICD-10-CM | POA: Diagnosis not present

## 2014-03-08 DIAGNOSIS — N186 End stage renal disease: Secondary | ICD-10-CM | POA: Diagnosis not present

## 2014-03-08 DIAGNOSIS — Z992 Dependence on renal dialysis: Secondary | ICD-10-CM | POA: Diagnosis not present

## 2014-03-08 DIAGNOSIS — D631 Anemia in chronic kidney disease: Secondary | ICD-10-CM | POA: Diagnosis not present

## 2014-03-11 DIAGNOSIS — D509 Iron deficiency anemia, unspecified: Secondary | ICD-10-CM | POA: Diagnosis not present

## 2014-03-11 DIAGNOSIS — Z992 Dependence on renal dialysis: Secondary | ICD-10-CM | POA: Diagnosis not present

## 2014-03-11 DIAGNOSIS — N2581 Secondary hyperparathyroidism of renal origin: Secondary | ICD-10-CM | POA: Diagnosis not present

## 2014-03-11 DIAGNOSIS — N186 End stage renal disease: Secondary | ICD-10-CM | POA: Diagnosis not present

## 2014-03-11 DIAGNOSIS — D631 Anemia in chronic kidney disease: Secondary | ICD-10-CM | POA: Diagnosis not present

## 2014-03-13 DIAGNOSIS — D509 Iron deficiency anemia, unspecified: Secondary | ICD-10-CM | POA: Diagnosis not present

## 2014-03-13 DIAGNOSIS — N186 End stage renal disease: Secondary | ICD-10-CM | POA: Diagnosis not present

## 2014-03-13 DIAGNOSIS — Z992 Dependence on renal dialysis: Secondary | ICD-10-CM | POA: Diagnosis not present

## 2014-03-13 DIAGNOSIS — N2581 Secondary hyperparathyroidism of renal origin: Secondary | ICD-10-CM | POA: Diagnosis not present

## 2014-03-13 DIAGNOSIS — D631 Anemia in chronic kidney disease: Secondary | ICD-10-CM | POA: Diagnosis not present

## 2014-03-15 DIAGNOSIS — N186 End stage renal disease: Secondary | ICD-10-CM | POA: Diagnosis not present

## 2014-03-15 DIAGNOSIS — D509 Iron deficiency anemia, unspecified: Secondary | ICD-10-CM | POA: Diagnosis not present

## 2014-03-15 DIAGNOSIS — N2581 Secondary hyperparathyroidism of renal origin: Secondary | ICD-10-CM | POA: Diagnosis not present

## 2014-03-15 DIAGNOSIS — Z992 Dependence on renal dialysis: Secondary | ICD-10-CM | POA: Diagnosis not present

## 2014-03-15 DIAGNOSIS — D631 Anemia in chronic kidney disease: Secondary | ICD-10-CM | POA: Diagnosis not present

## 2014-03-16 DIAGNOSIS — Z992 Dependence on renal dialysis: Secondary | ICD-10-CM | POA: Diagnosis not present

## 2014-03-16 DIAGNOSIS — N186 End stage renal disease: Secondary | ICD-10-CM | POA: Diagnosis not present

## 2014-03-17 DIAGNOSIS — N186 End stage renal disease: Secondary | ICD-10-CM | POA: Diagnosis not present

## 2014-03-17 DIAGNOSIS — N2581 Secondary hyperparathyroidism of renal origin: Secondary | ICD-10-CM | POA: Diagnosis not present

## 2014-03-17 DIAGNOSIS — D509 Iron deficiency anemia, unspecified: Secondary | ICD-10-CM | POA: Diagnosis not present

## 2014-03-17 DIAGNOSIS — D631 Anemia in chronic kidney disease: Secondary | ICD-10-CM | POA: Diagnosis not present

## 2014-03-17 DIAGNOSIS — Z992 Dependence on renal dialysis: Secondary | ICD-10-CM | POA: Diagnosis not present

## 2014-03-27 DIAGNOSIS — E119 Type 2 diabetes mellitus without complications: Secondary | ICD-10-CM | POA: Diagnosis not present

## 2014-04-16 DIAGNOSIS — N186 End stage renal disease: Secondary | ICD-10-CM | POA: Diagnosis not present

## 2014-04-16 DIAGNOSIS — Z992 Dependence on renal dialysis: Secondary | ICD-10-CM | POA: Diagnosis not present

## 2014-04-17 DIAGNOSIS — N186 End stage renal disease: Secondary | ICD-10-CM | POA: Diagnosis not present

## 2014-04-17 DIAGNOSIS — Z992 Dependence on renal dialysis: Secondary | ICD-10-CM | POA: Diagnosis not present

## 2014-04-17 DIAGNOSIS — D509 Iron deficiency anemia, unspecified: Secondary | ICD-10-CM | POA: Diagnosis not present

## 2014-04-17 DIAGNOSIS — N2581 Secondary hyperparathyroidism of renal origin: Secondary | ICD-10-CM | POA: Diagnosis not present

## 2014-04-17 DIAGNOSIS — D631 Anemia in chronic kidney disease: Secondary | ICD-10-CM | POA: Diagnosis not present

## 2014-04-19 DIAGNOSIS — D509 Iron deficiency anemia, unspecified: Secondary | ICD-10-CM | POA: Diagnosis not present

## 2014-04-19 DIAGNOSIS — N2581 Secondary hyperparathyroidism of renal origin: Secondary | ICD-10-CM | POA: Diagnosis not present

## 2014-04-19 DIAGNOSIS — Z992 Dependence on renal dialysis: Secondary | ICD-10-CM | POA: Diagnosis not present

## 2014-04-19 DIAGNOSIS — N186 End stage renal disease: Secondary | ICD-10-CM | POA: Diagnosis not present

## 2014-04-19 DIAGNOSIS — D631 Anemia in chronic kidney disease: Secondary | ICD-10-CM | POA: Diagnosis not present

## 2014-04-21 DIAGNOSIS — N186 End stage renal disease: Secondary | ICD-10-CM | POA: Diagnosis not present

## 2014-04-21 DIAGNOSIS — D631 Anemia in chronic kidney disease: Secondary | ICD-10-CM | POA: Diagnosis not present

## 2014-04-21 DIAGNOSIS — Z992 Dependence on renal dialysis: Secondary | ICD-10-CM | POA: Diagnosis not present

## 2014-04-21 DIAGNOSIS — N2581 Secondary hyperparathyroidism of renal origin: Secondary | ICD-10-CM | POA: Diagnosis not present

## 2014-04-21 DIAGNOSIS — D509 Iron deficiency anemia, unspecified: Secondary | ICD-10-CM | POA: Diagnosis not present

## 2014-04-24 DIAGNOSIS — N186 End stage renal disease: Secondary | ICD-10-CM | POA: Diagnosis not present

## 2014-04-24 DIAGNOSIS — D631 Anemia in chronic kidney disease: Secondary | ICD-10-CM | POA: Diagnosis not present

## 2014-04-24 DIAGNOSIS — N2581 Secondary hyperparathyroidism of renal origin: Secondary | ICD-10-CM | POA: Diagnosis not present

## 2014-04-24 DIAGNOSIS — Z992 Dependence on renal dialysis: Secondary | ICD-10-CM | POA: Diagnosis not present

## 2014-04-24 DIAGNOSIS — D509 Iron deficiency anemia, unspecified: Secondary | ICD-10-CM | POA: Diagnosis not present

## 2014-04-26 DIAGNOSIS — D631 Anemia in chronic kidney disease: Secondary | ICD-10-CM | POA: Diagnosis not present

## 2014-04-26 DIAGNOSIS — N186 End stage renal disease: Secondary | ICD-10-CM | POA: Diagnosis not present

## 2014-04-26 DIAGNOSIS — Z992 Dependence on renal dialysis: Secondary | ICD-10-CM | POA: Diagnosis not present

## 2014-04-26 DIAGNOSIS — N2581 Secondary hyperparathyroidism of renal origin: Secondary | ICD-10-CM | POA: Diagnosis not present

## 2014-04-26 DIAGNOSIS — D509 Iron deficiency anemia, unspecified: Secondary | ICD-10-CM | POA: Diagnosis not present

## 2014-04-28 DIAGNOSIS — D631 Anemia in chronic kidney disease: Secondary | ICD-10-CM | POA: Diagnosis not present

## 2014-04-28 DIAGNOSIS — D509 Iron deficiency anemia, unspecified: Secondary | ICD-10-CM | POA: Diagnosis not present

## 2014-04-28 DIAGNOSIS — N186 End stage renal disease: Secondary | ICD-10-CM | POA: Diagnosis not present

## 2014-04-28 DIAGNOSIS — Z992 Dependence on renal dialysis: Secondary | ICD-10-CM | POA: Diagnosis not present

## 2014-04-28 DIAGNOSIS — N2581 Secondary hyperparathyroidism of renal origin: Secondary | ICD-10-CM | POA: Diagnosis not present

## 2014-05-01 DIAGNOSIS — Z992 Dependence on renal dialysis: Secondary | ICD-10-CM | POA: Diagnosis not present

## 2014-05-01 DIAGNOSIS — N186 End stage renal disease: Secondary | ICD-10-CM | POA: Diagnosis not present

## 2014-05-01 DIAGNOSIS — N2581 Secondary hyperparathyroidism of renal origin: Secondary | ICD-10-CM | POA: Diagnosis not present

## 2014-05-01 DIAGNOSIS — D509 Iron deficiency anemia, unspecified: Secondary | ICD-10-CM | POA: Diagnosis not present

## 2014-05-01 DIAGNOSIS — D631 Anemia in chronic kidney disease: Secondary | ICD-10-CM | POA: Diagnosis not present

## 2014-05-02 DIAGNOSIS — T859XXA Unspecified complication of internal prosthetic device, implant and graft, initial encounter: Secondary | ICD-10-CM | POA: Diagnosis not present

## 2014-05-02 DIAGNOSIS — N186 End stage renal disease: Secondary | ICD-10-CM | POA: Diagnosis not present

## 2014-05-03 DIAGNOSIS — Z992 Dependence on renal dialysis: Secondary | ICD-10-CM | POA: Diagnosis not present

## 2014-05-03 DIAGNOSIS — D631 Anemia in chronic kidney disease: Secondary | ICD-10-CM | POA: Diagnosis not present

## 2014-05-03 DIAGNOSIS — N2581 Secondary hyperparathyroidism of renal origin: Secondary | ICD-10-CM | POA: Diagnosis not present

## 2014-05-03 DIAGNOSIS — N186 End stage renal disease: Secondary | ICD-10-CM | POA: Diagnosis not present

## 2014-05-03 DIAGNOSIS — D509 Iron deficiency anemia, unspecified: Secondary | ICD-10-CM | POA: Diagnosis not present

## 2014-05-05 DIAGNOSIS — Z992 Dependence on renal dialysis: Secondary | ICD-10-CM | POA: Diagnosis not present

## 2014-05-05 DIAGNOSIS — N186 End stage renal disease: Secondary | ICD-10-CM | POA: Diagnosis not present

## 2014-05-05 DIAGNOSIS — N2581 Secondary hyperparathyroidism of renal origin: Secondary | ICD-10-CM | POA: Diagnosis not present

## 2014-05-05 DIAGNOSIS — D509 Iron deficiency anemia, unspecified: Secondary | ICD-10-CM | POA: Diagnosis not present

## 2014-05-05 DIAGNOSIS — D631 Anemia in chronic kidney disease: Secondary | ICD-10-CM | POA: Diagnosis not present

## 2014-05-08 DIAGNOSIS — N2581 Secondary hyperparathyroidism of renal origin: Secondary | ICD-10-CM | POA: Diagnosis not present

## 2014-05-08 DIAGNOSIS — D509 Iron deficiency anemia, unspecified: Secondary | ICD-10-CM | POA: Diagnosis not present

## 2014-05-08 DIAGNOSIS — N186 End stage renal disease: Secondary | ICD-10-CM | POA: Diagnosis not present

## 2014-05-08 DIAGNOSIS — D631 Anemia in chronic kidney disease: Secondary | ICD-10-CM | POA: Diagnosis not present

## 2014-05-08 DIAGNOSIS — Z992 Dependence on renal dialysis: Secondary | ICD-10-CM | POA: Diagnosis not present

## 2014-05-10 DIAGNOSIS — Z992 Dependence on renal dialysis: Secondary | ICD-10-CM | POA: Diagnosis not present

## 2014-05-10 DIAGNOSIS — N2581 Secondary hyperparathyroidism of renal origin: Secondary | ICD-10-CM | POA: Diagnosis not present

## 2014-05-10 DIAGNOSIS — D631 Anemia in chronic kidney disease: Secondary | ICD-10-CM | POA: Diagnosis not present

## 2014-05-10 DIAGNOSIS — D509 Iron deficiency anemia, unspecified: Secondary | ICD-10-CM | POA: Diagnosis not present

## 2014-05-10 DIAGNOSIS — N186 End stage renal disease: Secondary | ICD-10-CM | POA: Diagnosis not present

## 2014-05-12 DIAGNOSIS — D631 Anemia in chronic kidney disease: Secondary | ICD-10-CM | POA: Diagnosis not present

## 2014-05-12 DIAGNOSIS — D509 Iron deficiency anemia, unspecified: Secondary | ICD-10-CM | POA: Diagnosis not present

## 2014-05-12 DIAGNOSIS — N186 End stage renal disease: Secondary | ICD-10-CM | POA: Diagnosis not present

## 2014-05-12 DIAGNOSIS — Z992 Dependence on renal dialysis: Secondary | ICD-10-CM | POA: Diagnosis not present

## 2014-05-12 DIAGNOSIS — N2581 Secondary hyperparathyroidism of renal origin: Secondary | ICD-10-CM | POA: Diagnosis not present

## 2014-05-15 DIAGNOSIS — D631 Anemia in chronic kidney disease: Secondary | ICD-10-CM | POA: Diagnosis not present

## 2014-05-15 DIAGNOSIS — N186 End stage renal disease: Secondary | ICD-10-CM | POA: Diagnosis not present

## 2014-05-15 DIAGNOSIS — D509 Iron deficiency anemia, unspecified: Secondary | ICD-10-CM | POA: Diagnosis not present

## 2014-05-15 DIAGNOSIS — Z992 Dependence on renal dialysis: Secondary | ICD-10-CM | POA: Diagnosis not present

## 2014-05-15 DIAGNOSIS — N2581 Secondary hyperparathyroidism of renal origin: Secondary | ICD-10-CM | POA: Diagnosis not present

## 2014-05-17 DIAGNOSIS — N2581 Secondary hyperparathyroidism of renal origin: Secondary | ICD-10-CM | POA: Diagnosis not present

## 2014-05-17 DIAGNOSIS — Z992 Dependence on renal dialysis: Secondary | ICD-10-CM | POA: Diagnosis not present

## 2014-05-17 DIAGNOSIS — D509 Iron deficiency anemia, unspecified: Secondary | ICD-10-CM | POA: Diagnosis not present

## 2014-05-17 DIAGNOSIS — D631 Anemia in chronic kidney disease: Secondary | ICD-10-CM | POA: Diagnosis not present

## 2014-05-17 DIAGNOSIS — N186 End stage renal disease: Secondary | ICD-10-CM | POA: Diagnosis not present

## 2014-06-15 DIAGNOSIS — Z992 Dependence on renal dialysis: Secondary | ICD-10-CM | POA: Diagnosis not present

## 2014-06-15 DIAGNOSIS — N186 End stage renal disease: Secondary | ICD-10-CM | POA: Diagnosis not present

## 2014-06-16 DIAGNOSIS — N186 End stage renal disease: Secondary | ICD-10-CM | POA: Diagnosis not present

## 2014-06-16 DIAGNOSIS — Z992 Dependence on renal dialysis: Secondary | ICD-10-CM | POA: Diagnosis not present

## 2014-06-16 DIAGNOSIS — D631 Anemia in chronic kidney disease: Secondary | ICD-10-CM | POA: Diagnosis not present

## 2014-06-16 DIAGNOSIS — N2581 Secondary hyperparathyroidism of renal origin: Secondary | ICD-10-CM | POA: Diagnosis not present

## 2014-06-16 DIAGNOSIS — D509 Iron deficiency anemia, unspecified: Secondary | ICD-10-CM | POA: Diagnosis not present

## 2014-06-26 DIAGNOSIS — E119 Type 2 diabetes mellitus without complications: Secondary | ICD-10-CM | POA: Diagnosis not present

## 2014-06-26 DIAGNOSIS — N186 End stage renal disease: Secondary | ICD-10-CM | POA: Diagnosis not present

## 2014-06-26 DIAGNOSIS — Z992 Dependence on renal dialysis: Secondary | ICD-10-CM | POA: Diagnosis not present

## 2014-06-27 DIAGNOSIS — Q828 Other specified congenital malformations of skin: Secondary | ICD-10-CM | POA: Diagnosis not present

## 2014-06-27 DIAGNOSIS — M7742 Metatarsalgia, left foot: Secondary | ICD-10-CM | POA: Diagnosis not present

## 2014-07-06 ENCOUNTER — Ambulatory Visit
Admit: 2014-07-06 | Disposition: A | Discharge status: Home / Self Care | Payer: Self-pay | Attending: Vascular Surgery | Admitting: Vascular Surgery

## 2014-07-06 DIAGNOSIS — I120 Hypertensive chronic kidney disease with stage 5 chronic kidney disease or end stage renal disease: Secondary | ICD-10-CM | POA: Diagnosis not present

## 2014-07-06 DIAGNOSIS — Z992 Dependence on renal dialysis: Secondary | ICD-10-CM | POA: Diagnosis not present

## 2014-07-06 DIAGNOSIS — Z7982 Long term (current) use of aspirin: Secondary | ICD-10-CM | POA: Diagnosis not present

## 2014-07-06 DIAGNOSIS — T82858A Stenosis of vascular prosthetic devices, implants and grafts, initial encounter: Secondary | ICD-10-CM | POA: Diagnosis not present

## 2014-07-06 DIAGNOSIS — F17200 Nicotine dependence, unspecified, uncomplicated: Secondary | ICD-10-CM | POA: Diagnosis not present

## 2014-07-06 DIAGNOSIS — Z01812 Encounter for preprocedural laboratory examination: Secondary | ICD-10-CM | POA: Diagnosis not present

## 2014-07-06 DIAGNOSIS — T82511S Breakdown (mechanical) of surgically created arteriovenous shunt, sequela: Secondary | ICD-10-CM | POA: Diagnosis not present

## 2014-07-06 DIAGNOSIS — N186 End stage renal disease: Secondary | ICD-10-CM | POA: Diagnosis not present

## 2014-07-06 LAB — HCG, QUANTITATIVE, PREGNANCY: Beta Hcg, Quant.: 1 m[IU]/mL

## 2014-07-06 LAB — POTASSIUM: Potassium: 4.9 mmol/L

## 2014-07-07 NOTE — H&P (Signed)
PATIENT NAME:  [name], [name] MR#:  [id number] DATE OF BIRTH:  03-21-1975  DATE OF ADMISSION:  02/17/2013  ADDENDUM  The patient was counseled on tobacco abuse. She smokes a pack a day. She is not at all interested in quitting smoking. She does not want a nicotine patch.   TIME SPENT: Approximately 3-1/2 minutes on this.   ____________________________ Pius Byrom P. Benjie Karvonen, MD spm:sb D: 02/17/2013 15:22:11 ET T: 02/17/2013 15:30:40 ET JOB#: [id number]  cc: Billye Pickerel P. Benjie Karvonen, MD, <Dictator> Donell Beers Myrian Botello MD ELECTRONICALLY SIGNED 02/17/2013 16:45

## 2014-07-07 NOTE — H&P (Signed)
PATIENT NAME:  [name], [name] MR#:  [id number] DATE OF BIRTH:  1975-08-02  DATE OF ADMISSION:  02/17/2013  CHIEF COMPLAINT: Hyperkalemia.   HISTORY OF PRESENT ILLNESS: This is a 39 year old female with end-stage renal disease on hemodialysis who has a left HeRO graft who came in for an outpatient procedure for declot of the HeRO graft.  However, her potassium was elevated; therefore, Dr. Lucky Cowboy cannot do the procedure, so she is admitted for observation for dialysis and then declot. The patient did not know the last time she had dialysis. She was here about 2 weeks ago for the same issue.   REVIEW OF SYSTEMS:  CONSTITUTIONAL:  No fever, chills, weakness or lethargy.  SKIN: No skin ulcers, infections.  No rash or lesions. HEENT: No numbness, tingling, oropharynx swelling, or visual disturbances.  NECK: No neck pain.  LUNGS: No shortness of breath, history of COPD.  CARDIOVASCULAR: No chest pain, palpitations, orthopnea, PND.  GASTROINTESTINAL: No nausea, vomiting, diarrhea, abdominal pain, melena, or ulcers.  GENITOURINARY: No dysuria or hematuria.  ENDOCRINE: No polyuria or polydipsia.  PSYCHIATRIC: Anxiety and mild mental retardation.  NEUROLOGIC: Generalized weakness. No history of CVA or TIA.  MUSCULOSKELETAL: No joint pain. HEMATOLOGIC: Anemia of chronic disease. No history of DVT.   PAST MEDICAL HISTORY:  1.  Tobacco dependence.  2.  Mild mental retardation.  3.  Hypertension.  4.  Anemia of chronic disease.  5.  Depression and anxiety.  6.  GERD.  7.  History of migraine headaches.  8.  Osteoarthritis. 9.  History of peripheral neuropathy.  10.  History of seizure disorder.  11.  History of lupus with nephritis, malar rash, left foot droop.   ALLERGIES: No known allergy symptoms.   SOCIAL HISTORY: Smokes a pack a day. She is not interested in quitting. No IV drug use.   MEDICATIONS:  1.  Xanax 0.25 b.i.d.  2.  Procrit 2000 injectable daily.  3.  Dilantin 100 mg b.i.d.   4.  Docusate 100 mg b.i.d.  5.  Zoloft 200 mg two tablets daily.  6.  Rena-Vite tablet daily.  7.  Pantoprazole 40 mg daily.  8.  Hydroxychloroquine 200 mg daily.  9.  Simvastatin 40  mg at bedtime.  10.  Midodrine 10 mg b.i.d. 11.  Plavix 75 mg daily.   PAST SURGICAL HISTORY: She has a left HeRO graft.   PHYSICAL EXAMINATION:  VITAL SIGNS: Blood pressure 132/61, respirations 18, pulse 59. Temperature is 98% on room air.  GENERAL: Alert, oriented, does not appear to be in acute distress. She does appear to be in a debilitated state.  HEENT: Head is atraumatic. Pupils are round and reactive. Sclerae anicteric. Mucous membranes are moist. NECK: Supple without JVD, carotid bruit or enlarged thyroid.  CARDIOVASCULAR: Regular rate and rhythm. No murmurs, gallops, or rubs. PMI is not displaced.  LUNGS: Clear to auscultation. No crackles, rales, rhonchi, or wheezing. Normal to percussion. Normal chest expansion.  ABDOMEN: Bowel sounds are positive. Nontender, nondistended. No hepatosplenomegaly.  EXTREMITIES: No clubbing, cyanosis or edema. She has a left HeRO graft and now she has got a right femoral central line.  NEUROLOGIC: Cranial nerves II through XII grossly intact. Muscular strength is about 4/5 bilaterally and symmetrically.   LABORATORIES: Potassium was reported at 6.0 and then 5.8.  ASSESSMENT AND PLAN: A 38 year old female with end-stage renal disease on hemodialysis. She has had numerous issues with her left HeRO graft. She comes again as an outpatient for declot of  the HeRO graft. She unfortunately has hyperkalemia and will need dialysis.  1.  Hyperkalemia secondary to the need for hemodialysis. Dr. Candiss Norse will be consulted for dialysis.  2.  Carotid HeRO grafts. The patient will have claudication of that HeRO graft with Dr. Lucky Cowboy as well as her potassium is normalized. Will check a BMP in the a.m.  3.  History of hypertension. Continue outpatient medications.  4.  History of mild  mental retardation. Continue her outpatient medications.  5.  History of lupus. Continue Plaquenil.  6.  History of anxiety, depression. We will continue her outpatient medications.   CODE STATUS: FULL CODE STATUS.   TIME SPENT: Approximately 40 minutes.   ____________________________ Donell Beers. Benjie Karvonen, MD spm:np D: 02/17/2013 14:39:11 ET T: 02/17/2013 17:09:40 ET JOB#: [id number]  cc: Toshie Demelo P. Benjie Karvonen, MD, <Dictator> Donell Beers Shloime Keilman MD ELECTRONICALLY SIGNED 02/18/2013 10:23

## 2014-07-07 NOTE — Op Note (Signed)
PATIENT NAME:  [name], REITNAUER MR#:  [id number] DATE OF BIRTH:  1976/02/12  DATE OF PROCEDURE:  02/18/2013  PREOPERATIVE DIAGNOSES: 1.  End-stage renal disease.  2.  Clotted left arm HeRO graft.  3.  Hypertension.   POSTOPERATIVE DIAGNOSES: 1.  End-stage renal disease.  2.  Clotted left arm HeRO graft.  3.  Hypertension.   PROCEDURES: 1.  Ultrasound guidance for vascular access to the HeRO graft in both an antegrade and retrograde fashion.  2.  Left upper extremity shuntogram and central venogram.  3.  Catheter directed thrombolysis with 4 mg of TPA.  4.  Fogarty embolectomy for arterial plug.  5.  Mechanical rheolytic thrombectomy for thrombosis within the graft.  6.  Percutaneous transluminal angioplasty for residual thrombus and stenosis in the graft.  7.  Viabahn covered stent placement into graft for residual stenosis and thrombus after above procedures.   SURGEON: Algernon Huxley, M.D.   ANESTHESIA: Local with moderate conscious sedation.   ESTIMATED BLOOD LOSS: 25 mL   FLUOROSCOPY TIME: 5-1/2 minutes.   CONTRAST USED: 25 mL.   INDICATION FOR PROCEDURE: This is a 39 year old African American female with end-stage renal disease. Her HeRO graft is again clotted and we are trying to salvage this.   DESCRIPTION OF PROCEDURE: The patient is brought to the vascular interventional radiology suite. Left upper extremity is sterilely prepped and draped, and a sterile surgical field is created. Ultrasound is used to visualize the occluded graft and accessed in both an antegrade and retrograde fashion crossing with micropuncture needle. Micropuncture wire and sheath were placed.  We upsized to 6 [place] sheaths.  Imaging showed a thrombosed graft. Magic torque wires were placed into the axillary artery and into the central venous circulation and catheter directed thrombolysis was delivered with 4 mg of TPA with the AngioJet AVX catheter to the graft. This was allowed to dwell. Fogerty  embolectomy was performed for clear arterial plug. Once the arterial plug was resolved, the retrograde sheath could be removed as the residual thrombus and stenosis within the graft was in the mid to distal portion. Mechanical rheolytic thrombectomy was performed over this area, but there was still residual thrombus and flow limitation within the graft. A 7 mm diameter angioplasty balloon was inflated with improvement but not resolution of the graft appearance.  I elected to place a covered stent. A 6 mm diameter x 10 cm Viabahn stent was deployed in the midportion of the graft and now the distal two thirds of the graft has Viabahn stents lining it.  After this, the graft had excellent flow with a good angiographic completion result. I pulled the second sheath and 4-0 Monocryl pursestring suture was placed. Pressure was held. Sterile dressing was placed. The patient tolerated the procedure well and was taken to the recovery room in stable condition.    ____________________________ Algernon Huxley, MD jsd:dp D: 02/18/2013 09:22:01 ET T: 02/18/2013 09:33:00 ET JOB#: [id number]  cc: Algernon Huxley, MD, <Dictator> Algernon Huxley MD ELECTRONICALLY SIGNED 02/23/2013 9:47

## 2014-07-07 NOTE — H&P (Signed)
PATIENT NAME:  [name], [name] MR#:  [id number] DATE OF BIRTH:  [birth year]  DATE OF ADMISSION:  02/03/2013  HISTORY OF PRESENT ILLNESS: The patient is a 39 year old female patient with a history of ESRD, on hemodialysis for about a year from [facility] in [place].  Brought in because of a dialysis access problem. The patient was seen in vascular suite for declotting HD access, but potassium was 7.1. So Dr. Lucky Cowboy put in a temporary dialysis catheter in the right groin, and we are asked to admit the patient for hyperkalemia. The patient is tentatively scheduled to have declotting of HD access on Monday, so Dr. Candiss Norse is arranging emergent hemodialysis today and tomorrow. After that, patient can return back to the nursing home and then come in as an outpatient to have a declotting of her HeRO graft. The patient's last dialysis was Monday. She did not have hemodialysis yesterday and denies any chest pain or trouble breathing. A very poor historian. Does have underlying mild mental retardation. The patient is not able to give much history, except that she has lupus and also seizure disorder. History is obtained from her previous records from [facility].  The patient denies any complaints, and not even focusing on the history. When I asked, she was watching TV. Patient not appearing in distress.   PAST MEDICAL HISTORY: Significant for hypertension, anemia, depression, GERD, migraine headache, osteoarthritis, peripheral neuropathy, seizure disorder, history of lupus with nephritis, malar rash, left footdrop, hyperglycemia, mildly mental retardation.   ALLERGIES: No known allergies.   SOCIAL HISTORY: Continues to smoke, the patient smokes cigars. No alcohol. No drugs.   MEDICATIONS: She is on  1.  Aspirin 81 mg daily.  2.  Claritin as needed. 3.  Colace 100 mg p.o. daily.  4.  Coreg 25 mg p.o. b.i.d.  5.  Hydroxychloroquine 200 mg once a day.  6.  Dilantin 100 mg p.o. b.i.d.  7.  Plavix 75 mg  p.o. daily.  8.  Xanax 0.25 mg p.o. b.i.d.  9.  Midodrine 10 mg p.o. b.i.d.  10.  Pantoprazole 40 mg p.o. daily.  11.  Procrit.  12.  Renal Vite.   13.    14.  Simvastatin 40 mg p.o. daily.   SURGICAL HISTORY: Significant for upper extremity dialysis access.  FAMILY HISTORY:  Unknown. The patient not able to give any family history.  SOCIAL HISTORY:  The patient has 3 children. They are living with grandparents.   REVIEW OF SYSTEMS:  GENERAL: Denies any fever or chills.  SKIN: No skin infection or skin ulcers.  HEENT:  The patient denies any facial tingling or numbness. No visual disturbances. No ringing in ears or headache.  NECK: No neck pain.   RESPIRATORY: No difficulty breathing. The patient does not have any chronic cough.  CARDIOVASCULAR: The patient denies any chest pain or palpitations. Has some bradycardia here.  GASTROINTESTINAL: The patient denies any diarrhea or constipation. No nausea or abdominal pain.  MUSCULOSKELETAL: Denies any joint pains.  NEUROLOGIC: The patient does have weakness in hands and legs, but looks like she had a previous stroke before, but no dizziness.  PSYCHIATRIC: The patient has mild mental retardation. Denies any anxiety.  ENDOCRINE: No diabetes. No thyroid problems. HEMATOLOGIC:  No anemia, but says that she has history of DVT, unable to tell me which leg.   PHYSICAL EXAMINATION: VITAL SIGNS: Temperature 98 Fahrenheit, heart rate 53, blood pressure 116/66, sats 97% on room air.  GENERAL: The patient is  a well-developed, well-nourished female looking older than her stated age, and has mild flat affect.   INTEGUMENT: Skin is warm and dry.  HEENT:  Normocephalic, atraumatic. Pupils equally reacting to light. Extraocular movements are intact. No conjunctivitis. Hearing is intact. Tympanic membranes are clear. No pharyngeal erythema. Mucous membranes are not dry.  NECK: No thyroid enlargement. No JVD. No carotid bruit. No lymphadenopathy.   RESPIRATORY: Clear to auscultation. No wheeze. No rales. The patient is not using accessory muscles of respiration.   CARDIOVASCULAR: S1, S2 regular. No murmurs. PMI not displaced. No peripheral edema.  The patient has good femoral pulses.  ABDOMEN: Soft, nontender, nondistended. Bowel sounds present. No organomegaly.  SKIN: No skin rashes.  NEUROLOGIC: Cranial nerves intact. Slow to respond. Power is slightly decreased in upper extremities and lower extremities. No contractures.  PSYCHIATRIC: Oriented to time, place, person. A very poor historian, and poor insight.   MUSCULOSKELETAL: Not able to move all extremities. Says that she uses a walker at the nursing home.   LAB DATA: Hyperkalemia, with potassium of 7.1. No other labs are drawn. The patient's EKG shows bradycardia, heart rate 53 beats, and has tall T waves noted in V4, V5, V6.   ASSESSMENT AND PLAN: 1.  The patient is a 39 year old female patient with HeRO  issues with recurrent thrombosis. Found to have hyperkalemia. The patient is going to be admitted to hospitalist service for hyperkalemia, needing emergency hemodialysis. Dr. Lucky Cowboy has placed a right groin HD history, so she will get hemodialysis today and tomorrow, and repeat the potassium again. The patient can be discharged back to nursing home off the hemodialysis tomorrow if she is stable. Regarding her HeRO graft thrombosis, patient needs declotting and ( stent placement likely on Monday.   2.  Bradycardia. The patient is asymptomatic, likely due to hyperkalemia. Hold the Coreg at this time.   3.  History of lupus and possible transient ischemic attack before. She is on aspirin and Plavix. Continue that.   4.  Seizure disorder. Continue Dilantin. Says that she did not have any seizure recently.    5.  History of lupus. She is on Plaquenil. Continue Plaquenil.   6.  History of depression. She is on Zoloft and Xanax. Continue that.   Time spent on the history and physical:   60 minutes.     ____________________________ Epifanio Lesches, MD sk:mr D: 02/03/2013 16:59:05 ET T: 02/03/2013 18:59:03 ET JOB#: [id number]  cc: Epifanio Lesches, MD, <Dictator> Epifanio Lesches MD ELECTRONICALLY SIGNED 02/23/2013 22:29

## 2014-07-07 NOTE — Op Note (Signed)
PATIENT NAME:  [name], [name] MR#:  [id number] DATE OF BIRTH:  07/10/1975  DATE OF PROCEDURE:  02/03/2013  PREOPERATIVE DIAGNOSES:  1. Hyperkalemia precluding graft declot.  2. End-stage renal disease.   POSTOPERATIVE DIAGNOSES:  1. Hyperkalemia precluding graft declot.  2. End-stage renal disease.   PROCEDURES:  1. Ultrasound guidance for vascular access, right femoral vein.  2. Placement of a 30 cm Trialysis-type dialysis catheter, right femoral vein.   SURGEON: Algernon Huxley, M.D.   ASSISTANT: Marin Shutter. Hanne, PA-C.   ANESTHESIA: Local.   ESTIMATED BLOOD LOSS: 20 to 25 mL.   INDICATION FOR PROCEDURE: A 39 year old African American female with end-stage renal disease. She was brought in as an outpatient for a declot; however, her potassium was greater than 7 and she needs emergent dialysis and will not be able to have a thrombectomy procedure today. Temporary catheter will need to be placed.   DESCRIPTION OF PROCEDURE: The patient is laid flat in the vascular enterology suite. The groin was sterilely prepped and draped and a sterile surgical field was created. The right femoral vein was visualized on ultrasound and found to be widely patent. It was then accessed under direct ultrasound guidance without difficulty with a Seldinger needle. J-wire was then placed after skin nick and dilatation. The 30 cm long Trialysis-type dialysis catheter was placed over the wire and the wire was removed. All 3 lumens withdrew dark red nonpulsatile blood and flushed easily with sterile saline and was secured at 29 cm with 3 nylon sutures. Sterile dressing was placed.   ____________________________ Algernon Huxley, MD jsd:gb D: 02/03/2013 16:25:07 ET T: 02/03/2013 19:58:24 ET JOB#: [id number]  cc: Algernon Huxley, MD, <Dictator> Algernon Huxley MD ELECTRONICALLY SIGNED 02/21/2013 9:20

## 2014-07-07 NOTE — Discharge Summary (Signed)
PATIENT NAME:  [name], HEMMERLING MR#:  [id number] DATE OF BIRTH:  Mar 26, 1975  DATE OF ADMISSION:  02/03/2013 DATE OF DISCHARGE: 02/04/2013    ADMISSION DIAGNOSES:  1.  Hyperkalemia.  2.  End-stage renal disease on hemodialysis.   DISCHARGE DIAGNOSES: 1.  Hyperkalemia from clotted arteriovenous graft.  2.  End-stage renal disease on hemodialysis.  3.  History of cerebral vascular accident.  4.  History of mild mental retardation.  5.  Hyperlipidemia.   CONSULTATIONS:  1.  Dr. Lucky Cowboy.  2.  Dr. Candiss Norse from nephrology.   LABORATORIES AT DISCHARGE: Sodium 135, potassium 4.2, chloride 98, bicarbonate 32, BUN 15, creatinine 2.88. Glucose is 76.   HOSPITAL COURSE: A 39 year old female who was supposed to get declot of her graft, noted to have hyperkalemia, was admitted to the hospital for emergent dialysis and placement of a temporary catheter for dialysis. For further details, please refer to the H and P.  1.  Hyperkalemia. The patient's graft was clotted, and so she could not undergo dialysis. This was the reason for hyperkalemia. She had a temporary catheter placed by Dr. Lucky Cowboy in her right groin and went for dialysis emergent on the day of admission, as well as routine schedule of Monday, Wednesday, Friday. Her potassium has resolved.  2.  End-stage renal disease on hemodialysis. The patient has a clotted AV graft, planned for  declot on Monday as an outpatient.  We discussed with Dr. Lucky Cowboy. She had a  temporary catheter that was placed on the day of admission for emergent dialysis. We will discontinue this temporary catheter, and she will follow up on Monday for declot of her AV graft.  4.  Lupus on Plaquenil.    3.  History of depression on Zoloft.  5.  History of hyperlipidemia on statin.  6.  History cerebral vascular accident on Plavix.  7.  History of mild mental retardation.     DISCHARGE MEDICATIONS:  1.  Xanax 0.25 mg b.i.d.  2.  Procrit 2000 units/mL injectable.  3.  Dilantin 100 mg  b.i.d.  4.  Zoloft 100 mg b.i.d.  5.  Docusate 100 mg b.i.d.  6.  Rena-Vite 1 tablet daily.  7.  Pantoprazole 40 mg daily. 8.  Hydroxychloroquine 200 mg daily.  9.  Simvastatin 40 mg at bedtime.  10.  Midodrine 10 mg BID 11.  Plavix 75 mg daily.   DISCHARGE DIET: Renal diet.   DISCHARGE ACTIVITY: As tolerated.   DISCHARGE FOLLOWUP:  The patient will follow up on Monday for AV graft declottication TIME SPENT: 35 minutes.   The patient is medically stable for discharge.   ____________________________ Zahirah Cheslock P. Benjie Karvonen, MD spm:dmm D: 02/04/2013 10:18:00 ET T: 02/04/2013 10:33:07 ET JOB#: [id number]  cc: Clee Pandit P. Benjie Karvonen, MD, <Dictator> Algernon Huxley, MD Donell Beers Onofre Gains MD ELECTRONICALLY SIGNED 02/04/2013 11:15

## 2014-07-07 NOTE — Op Note (Signed)
PATIENT NAME:  [name], [name] MR#:  [id number] DATE OF BIRTH:  [birth year]  DATE OF PROCEDURE:  01/13/2013  PREOPERATIVE DIAGNOSES:  1.  End-stage renal disease.  2.  Poorly functioning left arm HeRO graft.  POSTOPERATIVE DIAGNOSES:  1.  End-stage renal disease.  2.  Poorly functioning left arm HeRO graft.   PROCEDURES:  1.  Ultrasound guidance for vascular access to left arm HeRO graft.  2.  Left upper extremity shuntogram and central venogram.  3.  Percutaneous transluminal angioplasty of left arm HeRO graft with 6 mm diameter angioplasty balloon.  4.  Viabahn stent placement, 6 mm diameter x 15 cm length stent, for greater than 50% residual stenosis with chronic thrombus after angioplasty.   SURGEON: Algernon Huxley, M.D.   ANESTHESIA: Local with moderate conscious sedation.   ESTIMATED BLOOD LOSS: Minimal.   INDICATION FOR PROCEDURE: This is a 39 year old African American female with end-stage renal disease. Her HeRO graft is functioning poorly and noninvasive study showed significant stenosis with some thrombus within the HeRO graft. At this point, she is brought in for angiography for evaluation and potential treatment. Risks and benefits were discussed. Informed consent was obtained.   DESCRIPTION OF PROCEDURE: The patient is brought to the vascular interventional radiology suite. The left upper extremity was sterilely prepped and draped, and a sterile surgical field was created. The graft was accessed near the arterial access site with a micropuncture needle under direct ultrasound guidance. A micropuncture wire and sheath were then placed. Imaging was then performed which showed multiple areas of stenosis and some chronic thrombus within the PTFE portion of the HeRO graft. I was able to cross these lesions without difficulty. I ballooned with a 6 mm diameter angioplasty balloon, but there remained residual flow-limiting stenosis and chronic thrombus.   I then covered this area with  a 6 mm diameter x 15 cm in length Viabahn stent post dilated with a 6 mm balloon and an excellent angiographic completion result was obtained after this. At this point I elected to terminate the procedure. The sheath was removed. A 4-0 Monocryl pursestring suture was placed and sterile dressing was placed. The patient tolerated the procedure well and was taken to the recovery room in stable condition.    ____________________________ Algernon Huxley, MD jsd:np D: 01/13/2013 15:17:10 ET T: 01/13/2013 16:46:29 ET JOB#: [id number]  cc: Algernon Huxley, MD, <Dictator> Algernon Huxley MD ELECTRONICALLY SIGNED 01/17/2013 9:57

## 2014-07-07 NOTE — Op Note (Signed)
PATIENT NAME:  [name], [name] MR#:  [id number] DATE OF BIRTH:  1975-12-10  DATE OF PROCEDURE:  02/17/2013  PREOPERATIVE DIAGNOSES: 1.  End-stage renal disease.  2.  Clotted left arm access.  3.  Hyperkalemia.   POSTOPERATIVE DIAGNOSES:   1.  End-stage renal disease.  2.  Clotted left arm access.  3.  Hyperkalemia.   PROCEDURE PERFORMED:  1.  Ultrasound guidance for vascular access, right femoral vein.  2.  Right femoral Trialysis catheter.   SURGEON: Algernon Huxley, M.D.   ANESTHESIA: Local.   ESTIMATED BLOOD LOSS: Minimal.   INDICATION FOR PROCEDURE: This is a 39 year old African American female with end-stage renal disease. She has hyperkalemia precluding declot of her dialysis access.   DESCRIPTION OF PROCEDURE: The patient is laid flat in the bed. Her right groin was sterilely prepped and draped and a sterile surgical field was created. The femoral vein was visualized with ultrasound and found to be patent. It was accessed under direct ultrasound guidance without difficulty with Seldinger needle. A J-wire was placed.  After skin nick and  dilatation, the Trialysis-type dialysis catheter was  placed over the wire, and the wire was removed. All 3 lumens withdrew dark red blood and flushed easily with sterile saline. It was secured to the skin at 29 cm with 3 nylon sutures.     ____________________________ Algernon Huxley, MD jsd:dmm D: 02/17/2013 17:19:12 ET T: 02/17/2013 20:22:48 ET JOB#: [id number]  cc: Algernon Huxley, MD, <Dictator> Algernon Huxley MD ELECTRONICALLY SIGNED 02/22/2013 12:39

## 2014-07-07 NOTE — Discharge Summary (Signed)
PATIENT NAME:  [name], [name] MR#:  [id number] DATE OF BIRTH:  [birth year]  DATE OF ADMISSION:  02/17/2013 DATE OF DISCHARGE:  02/18/2013  ADMITTING DIAGNOSIS: Hypokalemia.   DISCHARGE DIAGNOSES:  1.  Left upper extremity graft thrombosis, status post shuntogram, thrombolysis, thrombectomy, as well as PTA of graft and stent placement by Dr. Lucky Cowboy on 02/18/2013. 2.  Hypokalemia due to end-stage renal disease, status post temporary right groin catheter placement on 02/17/2013, status post emergent hemodialysis on 02/17/2013. 3.  History of hypertension.  4.  Tobacco dependence.  5.  Mild mitral regurgitation. 6.  Anemia of chronic disease. 7.  Anxiety and depression. 8.  Gastroesophageal reflux disease. 9.  Osteoarthritis. 10.  Peripheral neuropathy.  11.  Seizure disorder.  12.  Systemic lupus erythematosus. 13.  Nephritis. 14.  Neuritis. 15.  Foot drop.   DISCHARGE CONDITION: Stable.   DISCHARGE MEDICATIONS:  1.  The patient is to continue Plavix 75 mg p.o. daily.  2.  Alprazolam 0.25 mg twice daily.  3.  Procrit 2000 units with hemodialysis.  4.  Dilantin 100 mg p.o. twice daily.  5.  Docusate sodium 100 mg p.o. daily.  6.  Sertraline 100 mg 2 tablets once daily. 7.   Rena vite vitamin B complex. 8.  C with folic acid 1 tablet once daily.  9.   Pantoprazole 40 mg p.o. daily.  10.  Hydroxychloroquine 200 mg p.o. daily.  11.  Simvastatin 40 mg p.o. at bedtime.  12.  Midodrine 10 mg twice daily.  13.  Home oxygen: None.    DIET: 2 grams salt, low-fat, low-cholesterol, 1500 mL fluid restriction, hemodialysis diet, regular consistency.   ACTIVITY LIMITATIONS: As tolerated.    FOLLOWUP: Appointment with hemodialysis per prior schedule. Dr. Lucky Cowboy in one week.   CONSULTATIONS: Dr. Lucky Cowboy, Dr. Candiss Norse.  DIAGNOSTIC STUDIES: None.  HISTORY OF PRESENT ILLNESS: The patient does 39 year old African American female with past medical history significant for end-stage renal disease, who  presented to the hospital with hyperkalemia. Please refer to Dr. Gardiner Coins admission note on 02/17/2013. Apparently, the patient came to the hospital for outpatient procedure to declot her Hero graft by Dr. Lucky Cowboy, however, her labs were checked and her potassium was found to be elevated. Therefore, she was admitted for observation. She underwent a temporary catheter placement and then urgent hemodialysis. Post hemodialysis, she underwent procedure by Dr. dew to declot the Hero graft. During all her stay in the hospital time, the patient did well and did not have any significant discomfort or problems. She was ready to be discharged today on 02/18/2013. Her vital signs were stable, with temperature of 98, pulse of 76, respiratory rate was 18, blood pressure 113/73, saturation was 98% on room air at rest.   TIME SPENT: 40 minutes.   In regards to her chronic medical problems, such as gastroesophageal reflux disease, osteoarthritis, anemia, anxiety, depression, seizure disorder, no changes were made in her medication management. The patient is to return back to the assisted-living facility. She is to follow up with the hemodialysis center as per prior schedule.     ____________________________ Theodoro Grist, MD rv:cg D: 02/20/2013 18:48:56 ET T: 02/21/2013 00:41:17 ET JOB#: [id number]  cc: Theodoro Grist, MD, <Dictator> Algernon Huxley, MD [place] MD ELECTRONICALLY SIGNED 02/23/2013 16:36

## 2014-07-07 NOTE — Op Note (Signed)
PATIENT NAME:  SYRIA, AMEDEO MR#:  [id number] DATE OF BIRTH:  [birth year]  DATE OF PROCEDURE:  02/07/2013  PREOPERATIVE DIAGNOSES:  1.  End-stage renal disease.  2.  Clotted left arm hero graft.   POSTOPERATIVE DIAGNOSES:  1.  End-stage renal disease.  2.  Clotted left arm hero graft.   PROCEDURES:  1.  Ultrasound guidance for vascular access to the left arm hero graft in both a antegrade and retrograde fashion.  2.  Left upper extremity shuntogram and central venogram.  3.  Catheter directed thrombolysis with 4 mg of TPA to the hero graft.  4.  Fogarty embolectomy for residual plug at the proximal portion of the hero graft.  5.  Percutaneous transluminal angioplasty for residual stenosis and thrombus in the mid to distal portion of the PTFE portion of the hero graft.   SURGEON: Leotis Pain, M.D.   ANESTHESIA: Local with moderate conscious sedation.   BLOOD LOSS: Approximately 50 mL.   INDICATION FOR PROCEDURE: A 39 year old African American female with end-stage renal disease. Her left arm hero graft is clotted. We are attempting to salvage this. Risks and benefits were discussed. Informed consent was obtained.   DESCRIPTION OF PROCEDURE: The patient is brought to the vascular interventional radiology suite. Left upper extremity was sterilely prepped and draped and a sterile surgical field was created. This was a hero graft based off of the axillary artery. We accessed this in both an antegrade and retrograde fashion crossing and demonstrated a clotted graft on imaging. The Magic torque wire was placed centrally with the antegrade sheath and one was placed into the axillary artery with a retrograde sheath. TPA 4 mg was delivered in a power pulse spray fashion and a Fogarty embolectomy balloon was used from the retrograde sheath to pull the residual arterial plug. After the 4 mg of TPA had been allowed to dwell for about 10 to 15 minutes, imaging shows residual thrombus and stenosis in the  mid to distal portion of the PTFE portion of the graft. This is at both the proximal and distal limb of the previously placed Viabahn stent. This area was treated with a 7 mm diameter angioplasty balloon with good angiographic completion result. The graft was clinically palpable. This point, I elected to terminate the procedure. The sheath was removed around a 4-0 Monocryl pursestring suture. Pressure was held. Sterile dressing was placed. The patient tolerated the procedure well and was taken to the recovery room in stable condition.   ____________________________ Algernon Huxley, MD jsd:aw D: 02/07/2013 13:12:56 ET T: 02/07/2013 14:18:55 ET JOB#: [id number]  cc: Algernon Huxley, MD, <Dictator> Algernon Huxley MD ELECTRONICALLY SIGNED 02/21/2013 9:21

## 2014-07-08 NOTE — Op Note (Signed)
PATIENT NAME:  [name], [name] MR#:  [id number] DATE OF BIRTH:  Oct 12, 1975  DATE OF PROCEDURE:  06/16/2013  DATE OF DICTATION: 06/16/2013   PREOPERATIVE DIAGNOSES:  1. End-stage renal disease.  2. Hypertension.  3. Poorly functioning left arm HeRO graft.   POSTOPERATIVE DIAGNOSES:  1. End-stage renal disease.  2. Hypertension.  3. Poorly functioning left arm HeRO graft.   PROCEDURES:  1. Ultrasound guidance for vascular access to left arm HeRO graft.  2. Left upper extremity shuntogram and central venogram.  3. Percutaneous transluminal angioplasty of venous access site of HeRO graft with 7 and 8 mm diameter angioplasty balloons.   SURGEON: Algernon Huxley, MD  ANESTHESIA: Local with moderate conscious sedation.   ESTIMATED BLOOD LOSS: Minimal.   FLUOROSCOPY TIME: 2 minutes.   CONTRAST USED: 25 mL.  INDICATION FOR PROCEDURE: A 39 year old female with end-stage renal disease. She has a HeRO graft and has had multiple previous failed dialysis accesses. She has had this thrombosed on multiple times. A recent noninvasive study showed recurrent in-stent stenosis within the venous access portion of the HeRO graft, threatening the graft. She is brought in for intervention.   DESCRIPTION OF THE PROCEDURE: The patient was brought to the vascular suite. After an adequate level of intravenous sedation was obtained, the left upper extremity was sterilely prepped and draped, and a sterile surgical field was created. The graft was accessed under direct ultrasound guidance with a micropuncture needle. Micropuncture wire and sheath were then placed and upsized to a 6 [place] sheath. Then, 3000 units of intravenous heparin was given for systemic anticoagulation. Imaging showed an area of about a 50% stenosis at 1 stick site and about an 80% stenosis at another stick site just a couple centimeters away. The remainder of the graft was patent, and the tip was in the right atrium. We initially took a 7 mm  diameter angioplasty balloon and deployed this to 18 atmospheres. With the balloon inflated, imaging was performed which opacified the arterial anastomosis which was widely patent. There was still some residual narrowing that I elected to treat with an 8 mm diameter angioplasty balloon. Following this, there was less than 15% residual stenosis in either location, and I elected to terminate the procedure. The sheath was removed. A 4-0 Monocryl pursestring suture was placed. Pressure was held. Sterile dressing was placed. The patient tolerated the procedure well and was taken to the recovery room in stable condition.   ____________________________ Algernon Huxley, MD jsd:lb D: 06/16/2013 09:13:35 ET T: 06/16/2013 09:33:27 ET JOB#: [id number]  cc: Algernon Huxley, MD, <Dictator> Algernon Huxley MD ELECTRONICALLY SIGNED 06/20/2013 9:52

## 2014-07-08 NOTE — Op Note (Signed)
PATIENT NAME:  [name], [name] MR#:  [id number] DATE OF BIRTH:  08-15-1975  DATE OF PROCEDURE:  12/15/2013  PREOPERATIVE DIAGNOSES:  1.  End-stage renal disease.  2.  Poorly functioning left arm HeRO graft.   3.  Hypertension.   POSTOPERATIVE DIAGNOSES:  1.  End-stage renal disease.  2.  Poorly functioning left arm HeRO graft.   3.  Hypertension.   PROCEDURES:   1.  Ultrasound guidance for vascular access to left arm HeRO graft.  2.  Left upper extremity shuntogram and central venogram.  3.  Percutaneous transluminal angioplasty of mid PTFE portion of HeRO graft with 7 mm diameter high-pressure angioplasty balloon.   SURGEON: Algernon Huxley, MD.   ANESTHESIA: Local with moderate conscious sedation.   ESTIMATED BLOOD LOSS: Minimal.   FLUOROSCOPY TIME: Less than 1 minute, approximately 25 mL of contrast were used.   INDICATION FOR PROCEDURE: A 39 year old African-American female with end-stage renal disease. Her right arm HeRO graft has developed worsening flows and difficulties with clearance on dialysis.  Her dialysis contractor contacted [place] and requested an evaluation. Shuntogram is to be performed. Risks and benefits were discussed. Informed consent was obtained.   DESCRIPTION OF PROCEDURE: The patient was brought to the vascular suite. The left upper extremity was sterilely prepped and draped and a sterile surgical field was created. The graft was accessed about 4-5 cm beyond the arterial anastomosis in an antegrade fashion without difficulty under direct ultrasound guidance and a permanent image was recorded. Micropuncture wire and sheath were placed and we upsized to a 6 [place] sheath.  3000 units of intravenous heparin were given for systemic anticoagulation. Imaging was performed. This showed narrowing at the arterial and venous access sites in the midportion of the graft with stent fractures in the previous stents placed in these inclinations. The areas of stenosis of both  locations were greater than 60%, and were close enough together to be treated with 1 longer balloon.  The remainder of the graft was patent and the central venous portion of the HeRO graft was patent with outflow in the right atrium.  I crossed the lesion without difficulty with a Magic torque wire. A 7 mm diameter angioplasty balloon was inflated to 24 atmospheres to remove the waist and narrowing, the inflation was held for approximately 1 minute. With the balloon inflated imaging was performed, which showed the arterial anastomosis which was patent and the artery itself appeared somewhat narrowed prior to the graft, but her arteries were quite small and there may have been a component of spasm as well. The balloon was deflated and completion angiogram showed this area now to be widely patent without any significant residual stenosis, so I elected to terminate the procedure. The sheath was removed, 4-0 Monocryl pursestring suture was placed. Pressure was held. Sterile dressing was placed. The patient tolerated the procedure well and was taken to the recovery room in stable condition.    ____________________________ Algernon Huxley, MD jsd:bu D: 12/15/2013 12:45:11 ET T: 12/15/2013 14:36:51 ET JOB#: [id number]  cc: Algernon Huxley, MD, <Dictator> Algernon Huxley MD ELECTRONICALLY SIGNED 12/27/2013 10:21

## 2014-07-15 DIAGNOSIS — Z992 Dependence on renal dialysis: Secondary | ICD-10-CM | POA: Diagnosis not present

## 2014-07-15 DIAGNOSIS — N186 End stage renal disease: Secondary | ICD-10-CM | POA: Diagnosis not present

## 2014-07-16 NOTE — Op Note (Signed)
PATIENT NAME:  [name], OLLE MR#:  [id number] DATE OF BIRTH:  08-05-1975  DATE OF PROCEDURE:  07/06/2014.  PREOPERATIVE DIAGNOSES:  1. End-stage renal disease.  2. Diminished flow, left arm HeRO graft with stenosis at access site.   POSTOPERATIVE DIAGNOSES: 1. End-stage renal disease.  2. Diminished flow, left arm HeRO graft with stenosis at access site.   PROCEDURES:  1. Ultrasound guidance for vascular access to left arm HeRO graft.  2. Left upper extremity shuntogram and central venogram.  3. Percutaneous transluminal angioplasty of access sites with 7 mm diameter drug-coated angioplasty balloon and 8 mm diameter high-pressure angioplasty balloon.   SURGEON:  Algernon Huxley, MD.   ANESTHESIA:  Local with moderate conscious sedation.   ESTIMATED BLOOD LOSS:  Minimal.   INDICATION FOR PROCEDURE:  This is an individual well known to [place] for her dialysis access needs.  Her flows have diminished at dialysis.  We had seen her about two months ago and she had some known stenosis at her access sites but her graft was working well and this was not treated at this time.  When her flows diminished at dialysis, she was referred to [place] and we recommended intervention for salvage of the graft and to improve its flow.  Risks and benefits were discussed.  Informed consent was obtained.   DESCRIPTION OF THE PROCEDURE:  The patient's left upper extremity was sterilely prepped and draped, and a sterile surgical field was created.  I accessed the graft about 5 cm from the arterial anastomosis which was to the axillary artery.  This was done under direct ultrasound guidance without difficulty with a micropuncture needle, and a permanent image was recorded.  A micropuncture wire and sheath were placed and upsized to a 6-French sheath.  The patient was given 3000 units of intravenous heparin.  Imaging performed showed diffuse stenosis and narrowing with stent fracture at both the arterial and venous access  sites.  These were in close apposition, although there were several areas that appeared to be in the greater than 50% range although not high grade.  The stenoses appeared to be in the 65% to 70% range at worst, and the worst area was probably the arterial access site.  The venous access site was slightly less. These were close enough in apposition that they could be treated with a single balloon inflation.  The remainder of the central venous portion was patent, with the silicone portion having no significant irregularities.  I crossed the lesions without difficulty with a Magic Torque wire.  I initially took up to a 7 mm diameter Lutonix drug-coated angioplasty balloon.  This balloon actually burst after about 35-40 seconds of the inflation.  It was removed, and I upsized to an 8 mm diameter high-pressure angioplasty balloon.  With the balloon inflated, imaging was performed to opacify the arterial anastomosis which was patent.  There was some mild narrowing of the axillary artery proximal to the graft that appeared to be in the 30% to 40% range.  Completion angiogram after balloon deflation and removal showed much better flow with less than 10% residual stenosis in both access sites.  At this point, I elected to terminate the procedure.  The sheath was removed.  A 4-0 Monocryl pursestring suture was placed.  Pressure was held.  Sterile dressing was placed.  The patient tolerated the procedure well and was taken to the recovery room in stable condition.    ____________________________ Algernon Huxley, MD jsd:kc D: 07/06/2014  16:34:28 ET T: 07/06/2014 17:20:16 ET JOB#: [id number]  cc: Algernon Huxley, MD, <Dictator> Algernon Huxley MD ELECTRONICALLY SIGNED 07/12/2014 13:53

## 2014-07-17 DIAGNOSIS — D509 Iron deficiency anemia, unspecified: Secondary | ICD-10-CM | POA: Diagnosis not present

## 2014-07-17 DIAGNOSIS — Z992 Dependence on renal dialysis: Secondary | ICD-10-CM | POA: Diagnosis not present

## 2014-07-17 DIAGNOSIS — N186 End stage renal disease: Secondary | ICD-10-CM | POA: Diagnosis not present

## 2014-07-17 DIAGNOSIS — D631 Anemia in chronic kidney disease: Secondary | ICD-10-CM | POA: Diagnosis not present

## 2014-07-17 DIAGNOSIS — N2581 Secondary hyperparathyroidism of renal origin: Secondary | ICD-10-CM | POA: Diagnosis not present

## 2014-08-01 DIAGNOSIS — E43 Unspecified severe protein-calorie malnutrition: Secondary | ICD-10-CM | POA: Diagnosis not present

## 2014-08-01 DIAGNOSIS — I120 Hypertensive chronic kidney disease with stage 5 chronic kidney disease or end stage renal disease: Secondary | ICD-10-CM | POA: Diagnosis not present

## 2014-08-01 DIAGNOSIS — R569 Unspecified convulsions: Secondary | ICD-10-CM | POA: Diagnosis not present

## 2014-08-01 DIAGNOSIS — J449 Chronic obstructive pulmonary disease, unspecified: Secondary | ICD-10-CM | POA: Diagnosis not present

## 2014-08-15 DIAGNOSIS — Z992 Dependence on renal dialysis: Secondary | ICD-10-CM | POA: Diagnosis not present

## 2014-08-15 DIAGNOSIS — N186 End stage renal disease: Secondary | ICD-10-CM | POA: Diagnosis not present

## 2014-08-16 DIAGNOSIS — N186 End stage renal disease: Secondary | ICD-10-CM | POA: Diagnosis not present

## 2014-08-16 DIAGNOSIS — D509 Iron deficiency anemia, unspecified: Secondary | ICD-10-CM | POA: Diagnosis not present

## 2014-08-16 DIAGNOSIS — N2581 Secondary hyperparathyroidism of renal origin: Secondary | ICD-10-CM | POA: Diagnosis not present

## 2014-08-16 DIAGNOSIS — D631 Anemia in chronic kidney disease: Secondary | ICD-10-CM | POA: Diagnosis not present

## 2014-08-16 DIAGNOSIS — Z992 Dependence on renal dialysis: Secondary | ICD-10-CM | POA: Diagnosis not present

## 2014-08-18 DIAGNOSIS — D509 Iron deficiency anemia, unspecified: Secondary | ICD-10-CM | POA: Diagnosis not present

## 2014-08-18 DIAGNOSIS — N2581 Secondary hyperparathyroidism of renal origin: Secondary | ICD-10-CM | POA: Diagnosis not present

## 2014-08-18 DIAGNOSIS — Z992 Dependence on renal dialysis: Secondary | ICD-10-CM | POA: Diagnosis not present

## 2014-08-18 DIAGNOSIS — N186 End stage renal disease: Secondary | ICD-10-CM | POA: Diagnosis not present

## 2014-08-18 DIAGNOSIS — D631 Anemia in chronic kidney disease: Secondary | ICD-10-CM | POA: Diagnosis not present

## 2014-08-21 DIAGNOSIS — Z992 Dependence on renal dialysis: Secondary | ICD-10-CM | POA: Diagnosis not present

## 2014-08-21 DIAGNOSIS — D509 Iron deficiency anemia, unspecified: Secondary | ICD-10-CM | POA: Diagnosis not present

## 2014-08-21 DIAGNOSIS — N2581 Secondary hyperparathyroidism of renal origin: Secondary | ICD-10-CM | POA: Diagnosis not present

## 2014-08-21 DIAGNOSIS — D631 Anemia in chronic kidney disease: Secondary | ICD-10-CM | POA: Diagnosis not present

## 2014-08-21 DIAGNOSIS — N186 End stage renal disease: Secondary | ICD-10-CM | POA: Diagnosis not present

## 2014-08-23 DIAGNOSIS — Z992 Dependence on renal dialysis: Secondary | ICD-10-CM | POA: Diagnosis not present

## 2014-08-23 DIAGNOSIS — N2581 Secondary hyperparathyroidism of renal origin: Secondary | ICD-10-CM | POA: Diagnosis not present

## 2014-08-23 DIAGNOSIS — D631 Anemia in chronic kidney disease: Secondary | ICD-10-CM | POA: Diagnosis not present

## 2014-08-23 DIAGNOSIS — D509 Iron deficiency anemia, unspecified: Secondary | ICD-10-CM | POA: Diagnosis not present

## 2014-08-23 DIAGNOSIS — N186 End stage renal disease: Secondary | ICD-10-CM | POA: Diagnosis not present

## 2014-08-25 DIAGNOSIS — N186 End stage renal disease: Secondary | ICD-10-CM | POA: Diagnosis not present

## 2014-08-25 DIAGNOSIS — N2581 Secondary hyperparathyroidism of renal origin: Secondary | ICD-10-CM | POA: Diagnosis not present

## 2014-08-25 DIAGNOSIS — Z992 Dependence on renal dialysis: Secondary | ICD-10-CM | POA: Diagnosis not present

## 2014-08-25 DIAGNOSIS — D509 Iron deficiency anemia, unspecified: Secondary | ICD-10-CM | POA: Diagnosis not present

## 2014-08-25 DIAGNOSIS — D631 Anemia in chronic kidney disease: Secondary | ICD-10-CM | POA: Diagnosis not present

## 2014-08-28 DIAGNOSIS — N2581 Secondary hyperparathyroidism of renal origin: Secondary | ICD-10-CM | POA: Diagnosis not present

## 2014-08-28 DIAGNOSIS — Z992 Dependence on renal dialysis: Secondary | ICD-10-CM | POA: Diagnosis not present

## 2014-08-28 DIAGNOSIS — N186 End stage renal disease: Secondary | ICD-10-CM | POA: Diagnosis not present

## 2014-08-28 DIAGNOSIS — D509 Iron deficiency anemia, unspecified: Secondary | ICD-10-CM | POA: Diagnosis not present

## 2014-08-28 DIAGNOSIS — D631 Anemia in chronic kidney disease: Secondary | ICD-10-CM | POA: Diagnosis not present

## 2014-08-30 DIAGNOSIS — D509 Iron deficiency anemia, unspecified: Secondary | ICD-10-CM | POA: Diagnosis not present

## 2014-08-30 DIAGNOSIS — D631 Anemia in chronic kidney disease: Secondary | ICD-10-CM | POA: Diagnosis not present

## 2014-08-30 DIAGNOSIS — Z992 Dependence on renal dialysis: Secondary | ICD-10-CM | POA: Diagnosis not present

## 2014-08-30 DIAGNOSIS — N2581 Secondary hyperparathyroidism of renal origin: Secondary | ICD-10-CM | POA: Diagnosis not present

## 2014-08-30 DIAGNOSIS — N186 End stage renal disease: Secondary | ICD-10-CM | POA: Diagnosis not present

## 2014-08-31 DIAGNOSIS — T82511S Breakdown (mechanical) of surgically created arteriovenous shunt, sequela: Secondary | ICD-10-CM | POA: Diagnosis not present

## 2014-08-31 DIAGNOSIS — N186 End stage renal disease: Secondary | ICD-10-CM | POA: Diagnosis not present

## 2014-08-31 DIAGNOSIS — I10 Essential (primary) hypertension: Secondary | ICD-10-CM | POA: Diagnosis not present

## 2014-08-31 DIAGNOSIS — Y841 Kidney dialysis as the cause of abnormal reaction of the patient, or of later complication, without mention of misadventure at the time of the procedure: Secondary | ICD-10-CM | POA: Diagnosis not present

## 2014-08-31 DIAGNOSIS — Z992 Dependence on renal dialysis: Secondary | ICD-10-CM | POA: Diagnosis not present

## 2014-09-01 DIAGNOSIS — N2581 Secondary hyperparathyroidism of renal origin: Secondary | ICD-10-CM | POA: Diagnosis not present

## 2014-09-01 DIAGNOSIS — D631 Anemia in chronic kidney disease: Secondary | ICD-10-CM | POA: Diagnosis not present

## 2014-09-01 DIAGNOSIS — Z992 Dependence on renal dialysis: Secondary | ICD-10-CM | POA: Diagnosis not present

## 2014-09-01 DIAGNOSIS — N186 End stage renal disease: Secondary | ICD-10-CM | POA: Diagnosis not present

## 2014-09-01 DIAGNOSIS — D509 Iron deficiency anemia, unspecified: Secondary | ICD-10-CM | POA: Diagnosis not present

## 2014-09-04 DIAGNOSIS — D509 Iron deficiency anemia, unspecified: Secondary | ICD-10-CM | POA: Diagnosis not present

## 2014-09-04 DIAGNOSIS — Z992 Dependence on renal dialysis: Secondary | ICD-10-CM | POA: Diagnosis not present

## 2014-09-04 DIAGNOSIS — D631 Anemia in chronic kidney disease: Secondary | ICD-10-CM | POA: Diagnosis not present

## 2014-09-04 DIAGNOSIS — N2581 Secondary hyperparathyroidism of renal origin: Secondary | ICD-10-CM | POA: Diagnosis not present

## 2014-09-04 DIAGNOSIS — N186 End stage renal disease: Secondary | ICD-10-CM | POA: Diagnosis not present

## 2014-09-06 DIAGNOSIS — D631 Anemia in chronic kidney disease: Secondary | ICD-10-CM | POA: Diagnosis not present

## 2014-09-06 DIAGNOSIS — Z992 Dependence on renal dialysis: Secondary | ICD-10-CM | POA: Diagnosis not present

## 2014-09-06 DIAGNOSIS — D509 Iron deficiency anemia, unspecified: Secondary | ICD-10-CM | POA: Diagnosis not present

## 2014-09-06 DIAGNOSIS — N186 End stage renal disease: Secondary | ICD-10-CM | POA: Diagnosis not present

## 2014-09-06 DIAGNOSIS — N2581 Secondary hyperparathyroidism of renal origin: Secondary | ICD-10-CM | POA: Diagnosis not present

## 2014-09-08 DIAGNOSIS — D509 Iron deficiency anemia, unspecified: Secondary | ICD-10-CM | POA: Diagnosis not present

## 2014-09-08 DIAGNOSIS — N186 End stage renal disease: Secondary | ICD-10-CM | POA: Diagnosis not present

## 2014-09-08 DIAGNOSIS — D631 Anemia in chronic kidney disease: Secondary | ICD-10-CM | POA: Diagnosis not present

## 2014-09-08 DIAGNOSIS — Z992 Dependence on renal dialysis: Secondary | ICD-10-CM | POA: Diagnosis not present

## 2014-09-08 DIAGNOSIS — N2581 Secondary hyperparathyroidism of renal origin: Secondary | ICD-10-CM | POA: Diagnosis not present

## 2014-09-11 DIAGNOSIS — Z992 Dependence on renal dialysis: Secondary | ICD-10-CM | POA: Diagnosis not present

## 2014-09-11 DIAGNOSIS — N186 End stage renal disease: Secondary | ICD-10-CM | POA: Diagnosis not present

## 2014-09-11 DIAGNOSIS — D631 Anemia in chronic kidney disease: Secondary | ICD-10-CM | POA: Diagnosis not present

## 2014-09-11 DIAGNOSIS — D509 Iron deficiency anemia, unspecified: Secondary | ICD-10-CM | POA: Diagnosis not present

## 2014-09-11 DIAGNOSIS — N2581 Secondary hyperparathyroidism of renal origin: Secondary | ICD-10-CM | POA: Diagnosis not present

## 2014-09-13 DIAGNOSIS — D631 Anemia in chronic kidney disease: Secondary | ICD-10-CM | POA: Diagnosis not present

## 2014-09-13 DIAGNOSIS — Z992 Dependence on renal dialysis: Secondary | ICD-10-CM | POA: Diagnosis not present

## 2014-09-13 DIAGNOSIS — D509 Iron deficiency anemia, unspecified: Secondary | ICD-10-CM | POA: Diagnosis not present

## 2014-09-13 DIAGNOSIS — N2581 Secondary hyperparathyroidism of renal origin: Secondary | ICD-10-CM | POA: Diagnosis not present

## 2014-09-13 DIAGNOSIS — N186 End stage renal disease: Secondary | ICD-10-CM | POA: Diagnosis not present

## 2014-09-14 DIAGNOSIS — Z992 Dependence on renal dialysis: Secondary | ICD-10-CM | POA: Diagnosis not present

## 2014-09-14 DIAGNOSIS — N186 End stage renal disease: Secondary | ICD-10-CM | POA: Diagnosis not present

## 2014-09-15 DIAGNOSIS — D509 Iron deficiency anemia, unspecified: Secondary | ICD-10-CM | POA: Diagnosis not present

## 2014-09-15 DIAGNOSIS — N186 End stage renal disease: Secondary | ICD-10-CM | POA: Diagnosis not present

## 2014-09-15 DIAGNOSIS — Z992 Dependence on renal dialysis: Secondary | ICD-10-CM | POA: Diagnosis not present

## 2014-09-15 DIAGNOSIS — N2581 Secondary hyperparathyroidism of renal origin: Secondary | ICD-10-CM | POA: Diagnosis not present

## 2014-09-15 DIAGNOSIS — D631 Anemia in chronic kidney disease: Secondary | ICD-10-CM | POA: Diagnosis not present

## 2014-09-25 DIAGNOSIS — E119 Type 2 diabetes mellitus without complications: Secondary | ICD-10-CM | POA: Diagnosis not present

## 2014-10-15 DIAGNOSIS — Z992 Dependence on renal dialysis: Secondary | ICD-10-CM | POA: Diagnosis not present

## 2014-10-15 DIAGNOSIS — N186 End stage renal disease: Secondary | ICD-10-CM | POA: Diagnosis not present

## 2014-10-16 DIAGNOSIS — N2581 Secondary hyperparathyroidism of renal origin: Secondary | ICD-10-CM | POA: Diagnosis not present

## 2014-10-16 DIAGNOSIS — D631 Anemia in chronic kidney disease: Secondary | ICD-10-CM | POA: Diagnosis not present

## 2014-10-16 DIAGNOSIS — Z992 Dependence on renal dialysis: Secondary | ICD-10-CM | POA: Diagnosis not present

## 2014-10-16 DIAGNOSIS — D509 Iron deficiency anemia, unspecified: Secondary | ICD-10-CM | POA: Diagnosis not present

## 2014-10-16 DIAGNOSIS — N186 End stage renal disease: Secondary | ICD-10-CM | POA: Diagnosis not present

## 2014-11-14 ENCOUNTER — Encounter: Payer: Self-pay | Admitting: Adult Health

## 2014-11-14 ENCOUNTER — Ambulatory Visit (INDEPENDENT_AMBULATORY_CARE_PROVIDER_SITE_OTHER): Payer: Medicare Other | Admitting: Adult Health

## 2014-11-14 VITALS — BP 96/60 | HR 68 | Ht 63.5 in | Wt 100.0 lb

## 2014-11-14 DIAGNOSIS — Z01419 Encounter for gynecological examination (general) (routine) without abnormal findings: Secondary | ICD-10-CM

## 2014-11-14 DIAGNOSIS — Z0000 Encounter for general adult medical examination without abnormal findings: Secondary | ICD-10-CM | POA: Diagnosis not present

## 2014-11-14 NOTE — Patient Instructions (Signed)
Physical in 1 year,pap 2018 Mammogram at 49

## 2014-11-14 NOTE — Progress Notes (Signed)
Patient ID: [name], female   DOB: [birth year], 39 y.o.   MRN: [id number] History of Present Illness:  [name] is a 39 year old black female who resides in [facility], in for a well woman gyn exam,she had a normal pap with negative HPV 11/08/13.She has no complaints today. PCP is Dr Luan Pulling.  Current Medications, Allergies, Past Medical History, Past Surgical History, Family History and Social History were reviewed in [employer] record.     Review of Systems: Patient denies any headaches, hearing loss, fatigue, blurred vision, shortness of breath, chest pain, abdominal pain, problems with bowel movements, urination, or intercourse(not having sex). No joint pain or mood swings.    Physical Exam:BP 96/60 mmHg  Pulse 68  Ht 5' 3.5" (1.613 m)  Wt 100 lb (45.36 kg)  BMI 17.43 kg/m2 General:  Well developed, well nourished, no acute distress Skin:  Warm and dry Neck:  Midline trachea, normal thyroid, good ROM, no lymphadenopathy Lungs; Clear to auscultation bilaterally Breast:  No dominant palpable mass, retraction, or nipple discharge Cardiovascular: Regular rate and rhythm Abdomen:  Soft, non tender, no hepatosplenomegaly Pelvic:  External genitalia is normal in appearance, no lesions.  The vagina is normal in appearance. Urethra has no lesions or masses. The cervix is smooth.  Uterus is felt to be normal size, shape, and contour.  No adnexal masses or tenderness noted.Bladder is non tender, no masses felt. Extremities/musculoskeletal:  No swelling or varicosities noted, no clubbing or cyanosis Psych:  No mood changes, alert and cooperative,seems happy   Impression: Well woman gyn exam no pap     Plan: Physical in  1 year, pap in 2018 Get mammogram at 39

## 2014-11-15 DIAGNOSIS — N186 End stage renal disease: Secondary | ICD-10-CM | POA: Diagnosis not present

## 2014-11-15 DIAGNOSIS — Z992 Dependence on renal dialysis: Secondary | ICD-10-CM | POA: Diagnosis not present

## 2014-11-17 DIAGNOSIS — N186 End stage renal disease: Secondary | ICD-10-CM | POA: Diagnosis not present

## 2014-11-17 DIAGNOSIS — Z23 Encounter for immunization: Secondary | ICD-10-CM | POA: Diagnosis not present

## 2014-11-17 DIAGNOSIS — Z992 Dependence on renal dialysis: Secondary | ICD-10-CM | POA: Diagnosis not present

## 2014-11-17 DIAGNOSIS — D509 Iron deficiency anemia, unspecified: Secondary | ICD-10-CM | POA: Diagnosis not present

## 2014-11-17 DIAGNOSIS — D631 Anemia in chronic kidney disease: Secondary | ICD-10-CM | POA: Diagnosis not present

## 2014-11-17 DIAGNOSIS — N2581 Secondary hyperparathyroidism of renal origin: Secondary | ICD-10-CM | POA: Diagnosis not present

## 2014-12-05 DIAGNOSIS — T82511S Breakdown (mechanical) of surgically created arteriovenous shunt, sequela: Secondary | ICD-10-CM | POA: Diagnosis not present

## 2014-12-05 DIAGNOSIS — N186 End stage renal disease: Secondary | ICD-10-CM | POA: Diagnosis not present

## 2014-12-05 DIAGNOSIS — Y841 Kidney dialysis as the cause of abnormal reaction of the patient, or of later complication, without mention of misadventure at the time of the procedure: Secondary | ICD-10-CM | POA: Diagnosis not present

## 2014-12-05 DIAGNOSIS — I10 Essential (primary) hypertension: Secondary | ICD-10-CM | POA: Diagnosis not present

## 2014-12-05 DIAGNOSIS — F17200 Nicotine dependence, unspecified, uncomplicated: Secondary | ICD-10-CM | POA: Diagnosis not present

## 2014-12-05 DIAGNOSIS — Z992 Dependence on renal dialysis: Secondary | ICD-10-CM | POA: Diagnosis not present

## 2014-12-05 DIAGNOSIS — T82318A Breakdown (mechanical) of other vascular grafts, initial encounter: Secondary | ICD-10-CM | POA: Diagnosis not present

## 2014-12-15 DIAGNOSIS — Z992 Dependence on renal dialysis: Secondary | ICD-10-CM | POA: Diagnosis not present

## 2014-12-15 DIAGNOSIS — N186 End stage renal disease: Secondary | ICD-10-CM | POA: Diagnosis not present

## 2014-12-18 DIAGNOSIS — N186 End stage renal disease: Secondary | ICD-10-CM | POA: Diagnosis not present

## 2014-12-18 DIAGNOSIS — D509 Iron deficiency anemia, unspecified: Secondary | ICD-10-CM | POA: Diagnosis not present

## 2014-12-18 DIAGNOSIS — N2581 Secondary hyperparathyroidism of renal origin: Secondary | ICD-10-CM | POA: Diagnosis not present

## 2014-12-18 DIAGNOSIS — D631 Anemia in chronic kidney disease: Secondary | ICD-10-CM | POA: Diagnosis not present

## 2014-12-18 DIAGNOSIS — Z992 Dependence on renal dialysis: Secondary | ICD-10-CM | POA: Diagnosis not present

## 2014-12-25 DIAGNOSIS — E119 Type 2 diabetes mellitus without complications: Secondary | ICD-10-CM | POA: Diagnosis not present

## 2015-01-02 ENCOUNTER — Other Ambulatory Visit: Payer: Self-pay | Admitting: Vascular Surgery

## 2015-01-02 DIAGNOSIS — M7742 Metatarsalgia, left foot: Secondary | ICD-10-CM | POA: Diagnosis not present

## 2015-01-02 DIAGNOSIS — Q828 Other specified congenital malformations of skin: Secondary | ICD-10-CM | POA: Diagnosis not present

## 2015-01-04 ENCOUNTER — Ambulatory Visit
Admission: RE | Admit: 2015-01-04 | Discharge: 2015-01-04 | Disposition: A | Discharge status: Home / Self Care | Payer: Medicare Other | Source: Ambulatory Visit | Attending: Vascular Surgery | Admitting: Vascular Surgery

## 2015-01-04 ENCOUNTER — Encounter
Admission: RE | Disposition: A | Discharge status: Home / Self Care | Payer: Self-pay | Source: Ambulatory Visit | Attending: Vascular Surgery

## 2015-01-04 DIAGNOSIS — Z86718 Personal history of other venous thrombosis and embolism: Secondary | ICD-10-CM | POA: Diagnosis not present

## 2015-01-04 DIAGNOSIS — I120 Hypertensive chronic kidney disease with stage 5 chronic kidney disease or end stage renal disease: Secondary | ICD-10-CM | POA: Insufficient documentation

## 2015-01-04 DIAGNOSIS — Z79899 Other long term (current) drug therapy: Secondary | ICD-10-CM | POA: Insufficient documentation

## 2015-01-04 DIAGNOSIS — Z7982 Long term (current) use of aspirin: Secondary | ICD-10-CM | POA: Diagnosis not present

## 2015-01-04 DIAGNOSIS — T82858A Stenosis of vascular prosthetic devices, implants and grafts, initial encounter: Secondary | ICD-10-CM | POA: Diagnosis not present

## 2015-01-04 DIAGNOSIS — Y832 Surgical operation with anastomosis, bypass or graft as the cause of abnormal reaction of the patient, or of later complication, without mention of misadventure at the time of the procedure: Secondary | ICD-10-CM | POA: Diagnosis not present

## 2015-01-04 DIAGNOSIS — N186 End stage renal disease: Secondary | ICD-10-CM | POA: Insufficient documentation

## 2015-01-04 DIAGNOSIS — F17200 Nicotine dependence, unspecified, uncomplicated: Secondary | ICD-10-CM | POA: Diagnosis not present

## 2015-01-04 DIAGNOSIS — Z992 Dependence on renal dialysis: Secondary | ICD-10-CM | POA: Diagnosis not present

## 2015-01-04 DIAGNOSIS — Z993 Dependence on wheelchair: Secondary | ICD-10-CM | POA: Diagnosis not present

## 2015-01-04 HISTORY — DX: Essential (primary) hypertension: I10

## 2015-01-04 HISTORY — DX: Acute embolism and thrombosis of unspecified deep veins of unspecified lower extremity: I82.409

## 2015-01-04 HISTORY — PX: PERIPHERAL VASCULAR CATHETERIZATION: SHX172C

## 2015-01-04 LAB — POTASSIUM (ARMC VASCULAR LAB ONLY): POTASSIUM (ARMC VASCULAR LAB): 4.9

## 2015-01-04 LAB — HCG, QUANTITATIVE, PREGNANCY: HCG, BETA CHAIN, QUANT, S: 1 m[IU]/mL (ref <5)

## 2015-01-04 SURGERY — A/V SHUNTOGRAM/FISTULAGRAM
Anesthesia: Moderate Sedation

## 2015-01-04 MED ORDER — DEXTROSE 5 % IV SOLN
INTRAVENOUS | Status: AC
  Administered 2015-01-04: 09:00:00
  Filled 2015-01-04: qty 1.5

## 2015-01-04 MED ORDER — FENTANYL CITRATE (PF) 100 MCG/2ML IJ SOLN
INTRAMUSCULAR | Status: AC
  Filled 2015-01-04: qty 2

## 2015-01-04 MED ORDER — LIDOCAINE-EPINEPHRINE (PF) 1 %-1:200000 IJ SOLN
INTRAMUSCULAR | Status: AC
  Filled 2015-01-04: qty 30

## 2015-01-04 MED ORDER — IOHEXOL 300 MG/ML  SOLN
INTRAMUSCULAR | Status: DC | PRN
  Administered 2015-01-04: 35 mL via INTRA_ARTERIAL

## 2015-01-04 MED ORDER — HEPARIN (PORCINE) IN NACL 2-0.9 UNIT/ML-% IJ SOLN
INTRAMUSCULAR | Status: AC
  Filled 2015-01-04: qty 1000

## 2015-01-04 MED ORDER — MIDAZOLAM HCL 2 MG/2ML IJ SOLN
INTRAMUSCULAR | Status: DC | PRN
  Administered 2015-01-04: 2 mg via INTRAVENOUS

## 2015-01-04 MED ORDER — LIDOCAINE-EPINEPHRINE (PF) 1 %-1:200000 IJ SOLN
INTRAMUSCULAR | Status: DC | PRN
  Administered 2015-01-04: 10 mL via INTRADERMAL

## 2015-01-04 MED ORDER — MIDAZOLAM HCL 5 MG/5ML IJ SOLN
INTRAMUSCULAR | Status: AC
  Filled 2015-01-04: qty 5

## 2015-01-04 MED ORDER — HEPARIN SODIUM (PORCINE) 1000 UNIT/ML IJ SOLN
INTRAMUSCULAR | Status: DC | PRN
  Administered 2015-01-04: 3000 [IU] via INTRAVENOUS

## 2015-01-04 MED ORDER — DEXTROSE 5 % IV SOLN: 1.5000 g | INTRAVENOUS | Status: DC

## 2015-01-04 MED ORDER — HEPARIN SODIUM (PORCINE) 1000 UNIT/ML IJ SOLN
INTRAMUSCULAR | Status: AC
  Filled 2015-01-04: qty 1

## 2015-01-04 MED ORDER — FENTANYL CITRATE (PF) 100 MCG/2ML IJ SOLN
INTRAMUSCULAR | Status: DC | PRN
  Administered 2015-01-04: 50 ug via INTRAVENOUS

## 2015-01-04 MED ORDER — SODIUM CHLORIDE 0.9 % IV SOLN
INTRAVENOUS | Status: DC
  Administered 2015-01-04: 08:00:00 via INTRAVENOUS

## 2015-01-04 MED ORDER — CEFAZOLIN SODIUM 1-5 GM-% IV SOLN
INTRAVENOUS | Status: AC
  Filled 2015-01-04: qty 50

## 2015-01-04 SURGICAL SUPPLY — 8 items
BALLN LUTONIX DCB 7X60X130 (BALLOONS) ×6
BALLOON LUTONIX DCB 7X60X130 (BALLOONS) IMPLANT
CANNULA 5F STIFF (CANNULA) ×1 IMPLANT
DEVICE PRESTO INFLATION (MISCELLANEOUS) ×1 IMPLANT
DRAPE BRACHIAL (DRAPES) ×1 IMPLANT
PACK ANGIOGRAPHY (CUSTOM PROCEDURE TRAY) ×1 IMPLANT
SHEATH BRITE TIP 6FRX5.5 (SHEATH) ×1 IMPLANT
WIRE MAGIC TORQUE 260C (WIRE) ×1 IMPLANT

## 2015-01-04 NOTE — Op Note (Signed)
[name]    OPERATIVE NOTE   PROCEDURE: 1.  Left arm HeRO graft cannulation under ultrasound guidance 2.  Left arm shuntogram 3.  Percutaneous transluminal angioplasty of arterial access site with 7 mm diameter by 6 cm length Lutonix drug-coated angioplasty balloon 4.  Percutaneous transluminal angioplasty of proximal arm PTFE portion of hero graft just before the grommet for separate and distinct stenosis with 7 mm diameter by 6 cm length Lutonix drug-coated angioplasty balloon   PRE-OPERATIVE DIAGNOSIS: 1. ESRD 2. Malfunctioning left arm HeRO graft  POST-OPERATIVE DIAGNOSIS: same as above   SURGEON: Leotis Pain, MD  ANESTHESIA: local with MCS  ESTIMATED BLOOD LOSS:  MINIMAL  FINDING(S): 1. Moderate stenosis at access site of about 60-70%. More severe stenosis in the proximal PTFE portion of about 80-85%   SPECIMEN(S):  None  CONTRAST:  25 cc  INDICATIONS: [name] is a 39 y.o. female who presents with malfunctioning left arm HeRO graft.  The patient is scheduled for left arm shuntogram.  The patient is aware the risks include but are not limited to: bleeding, infection, thrombosis of the cannulated access, and possible anaphylactic reaction to the contrast.  The patient is aware of the risks of the procedure and elects to proceed forward.  DESCRIPTION: After full informed written consent was obtained, the patient was brought back to the angiography suite and placed supine upon the angiography table.  The patient was connected to monitoring equipment.  The left arm was prepped and draped in the standard fashion for a percutaneous access intervention.  Under ultrasound guidance, the initial portion of the left arm HeRO  graft was cannulated with a micropuncture needle under direct ultrasound guidance and a permanent image was performed.  The microwire was advanced into the fistula and the needle was exchanged for the a microsheath.  I then  upsized to a 6 Fr Sheath and imaging was performed.  Hand injections were completed to image the access including the central venous system. This demonstrated about a 60-70% stenosis at the access site, worse at the arterial access site.  There was a separate and distinct stenosis in the proximal portion of the PTFE near the grommet that was worse, in the 80-85% range.  Based on the images, this patient will need intervention to these areas. I then gave the patient 3000 units of intravenous heparin.  I then crossed the stenoses with a Magic Tourqe wire.  Based on the imaging, a 7 mm x 6 cm  Lutonix drug coated angioplasty balloon was selected for the proximal PTFE stenosis near the grommet.  The balloon was centered around the stenosis and inflated to 14 ATM for 1 minute(s).  On completion imaging, a <10% residual stenosis was present.   I then turned my attention to the access site stenosis.  I selected another 7 mm diameter x 6 cm length Lutonix drug coated angioplasty balloon was selected for the access site stenosis.  The balloon was inflated to 14 ATM for one minute.  An additional inflation was required in this area as well.  Completion angiogram after angioplasty showed about a 15-20% residual stenosis that was not flow limiting.   Based on the completion imaging, no further intervention is necessary.  The wire and balloon were removed from the sheath.  A 4-0 Monocryl purse-string suture was sewn around the sheath.  The sheath was removed while tying down the suture.  A sterile bandage was applied to the puncture site.  COMPLICATIONS: None  CONDITION: Stable   [name]  01/04/2015 9:35 AM

## 2015-01-04 NOTE — H&P (Signed)
  [facility] History & Physical Update  The patient was interviewed and re-examined.  The patient's previous History and Physical has been reviewed and is unchanged.  There is no change in the plan of care. We plan to proceed with the scheduled procedure.  Kaesen Rodriguez, MD  01/04/2015, 8:08 AM

## 2015-01-05 ENCOUNTER — Encounter: Payer: Self-pay | Admitting: Vascular Surgery

## 2015-01-15 DIAGNOSIS — N186 End stage renal disease: Secondary | ICD-10-CM | POA: Diagnosis not present

## 2015-01-15 DIAGNOSIS — Z992 Dependence on renal dialysis: Secondary | ICD-10-CM | POA: Diagnosis not present

## 2015-01-17 DIAGNOSIS — N2581 Secondary hyperparathyroidism of renal origin: Secondary | ICD-10-CM | POA: Diagnosis not present

## 2015-01-17 DIAGNOSIS — D509 Iron deficiency anemia, unspecified: Secondary | ICD-10-CM | POA: Diagnosis not present

## 2015-01-17 DIAGNOSIS — Z992 Dependence on renal dialysis: Secondary | ICD-10-CM | POA: Diagnosis not present

## 2015-01-17 DIAGNOSIS — N186 End stage renal disease: Secondary | ICD-10-CM | POA: Diagnosis not present

## 2015-01-17 DIAGNOSIS — D631 Anemia in chronic kidney disease: Secondary | ICD-10-CM | POA: Diagnosis not present

## 2015-01-19 DIAGNOSIS — D509 Iron deficiency anemia, unspecified: Secondary | ICD-10-CM | POA: Diagnosis not present

## 2015-01-19 DIAGNOSIS — N2581 Secondary hyperparathyroidism of renal origin: Secondary | ICD-10-CM | POA: Diagnosis not present

## 2015-01-19 DIAGNOSIS — D631 Anemia in chronic kidney disease: Secondary | ICD-10-CM | POA: Diagnosis not present

## 2015-01-19 DIAGNOSIS — Z992 Dependence on renal dialysis: Secondary | ICD-10-CM | POA: Diagnosis not present

## 2015-01-19 DIAGNOSIS — N186 End stage renal disease: Secondary | ICD-10-CM | POA: Diagnosis not present

## 2015-01-22 DIAGNOSIS — N186 End stage renal disease: Secondary | ICD-10-CM | POA: Diagnosis not present

## 2015-01-22 DIAGNOSIS — D631 Anemia in chronic kidney disease: Secondary | ICD-10-CM | POA: Diagnosis not present

## 2015-01-22 DIAGNOSIS — D509 Iron deficiency anemia, unspecified: Secondary | ICD-10-CM | POA: Diagnosis not present

## 2015-01-22 DIAGNOSIS — Z992 Dependence on renal dialysis: Secondary | ICD-10-CM | POA: Diagnosis not present

## 2015-01-22 DIAGNOSIS — N2581 Secondary hyperparathyroidism of renal origin: Secondary | ICD-10-CM | POA: Diagnosis not present

## 2015-01-24 DIAGNOSIS — N186 End stage renal disease: Secondary | ICD-10-CM | POA: Diagnosis not present

## 2015-01-24 DIAGNOSIS — N2581 Secondary hyperparathyroidism of renal origin: Secondary | ICD-10-CM | POA: Diagnosis not present

## 2015-01-24 DIAGNOSIS — D509 Iron deficiency anemia, unspecified: Secondary | ICD-10-CM | POA: Diagnosis not present

## 2015-01-24 DIAGNOSIS — D631 Anemia in chronic kidney disease: Secondary | ICD-10-CM | POA: Diagnosis not present

## 2015-01-24 DIAGNOSIS — Z992 Dependence on renal dialysis: Secondary | ICD-10-CM | POA: Diagnosis not present

## 2015-01-26 DIAGNOSIS — N2581 Secondary hyperparathyroidism of renal origin: Secondary | ICD-10-CM | POA: Diagnosis not present

## 2015-01-26 DIAGNOSIS — N186 End stage renal disease: Secondary | ICD-10-CM | POA: Diagnosis not present

## 2015-01-26 DIAGNOSIS — D509 Iron deficiency anemia, unspecified: Secondary | ICD-10-CM | POA: Diagnosis not present

## 2015-01-26 DIAGNOSIS — D631 Anemia in chronic kidney disease: Secondary | ICD-10-CM | POA: Diagnosis not present

## 2015-01-26 DIAGNOSIS — Z992 Dependence on renal dialysis: Secondary | ICD-10-CM | POA: Diagnosis not present

## 2015-01-29 DIAGNOSIS — Z992 Dependence on renal dialysis: Secondary | ICD-10-CM | POA: Diagnosis not present

## 2015-01-29 DIAGNOSIS — N186 End stage renal disease: Secondary | ICD-10-CM | POA: Diagnosis not present

## 2015-01-29 DIAGNOSIS — D631 Anemia in chronic kidney disease: Secondary | ICD-10-CM | POA: Diagnosis not present

## 2015-01-29 DIAGNOSIS — D509 Iron deficiency anemia, unspecified: Secondary | ICD-10-CM | POA: Diagnosis not present

## 2015-01-29 DIAGNOSIS — N2581 Secondary hyperparathyroidism of renal origin: Secondary | ICD-10-CM | POA: Diagnosis not present

## 2015-01-31 DIAGNOSIS — D509 Iron deficiency anemia, unspecified: Secondary | ICD-10-CM | POA: Diagnosis not present

## 2015-01-31 DIAGNOSIS — N186 End stage renal disease: Secondary | ICD-10-CM | POA: Diagnosis not present

## 2015-01-31 DIAGNOSIS — D631 Anemia in chronic kidney disease: Secondary | ICD-10-CM | POA: Diagnosis not present

## 2015-01-31 DIAGNOSIS — N2581 Secondary hyperparathyroidism of renal origin: Secondary | ICD-10-CM | POA: Diagnosis not present

## 2015-01-31 DIAGNOSIS — Z992 Dependence on renal dialysis: Secondary | ICD-10-CM | POA: Diagnosis not present

## 2015-02-01 DIAGNOSIS — I120 Hypertensive chronic kidney disease with stage 5 chronic kidney disease or end stage renal disease: Secondary | ICD-10-CM | POA: Diagnosis not present

## 2015-02-01 DIAGNOSIS — E43 Unspecified severe protein-calorie malnutrition: Secondary | ICD-10-CM | POA: Diagnosis not present

## 2015-02-01 DIAGNOSIS — M329 Systemic lupus erythematosus, unspecified: Secondary | ICD-10-CM | POA: Diagnosis not present

## 2015-02-01 DIAGNOSIS — J449 Chronic obstructive pulmonary disease, unspecified: Secondary | ICD-10-CM | POA: Diagnosis not present

## 2015-02-02 DIAGNOSIS — D509 Iron deficiency anemia, unspecified: Secondary | ICD-10-CM | POA: Diagnosis not present

## 2015-02-02 DIAGNOSIS — N2581 Secondary hyperparathyroidism of renal origin: Secondary | ICD-10-CM | POA: Diagnosis not present

## 2015-02-02 DIAGNOSIS — Z992 Dependence on renal dialysis: Secondary | ICD-10-CM | POA: Diagnosis not present

## 2015-02-02 DIAGNOSIS — N186 End stage renal disease: Secondary | ICD-10-CM | POA: Diagnosis not present

## 2015-02-02 DIAGNOSIS — D631 Anemia in chronic kidney disease: Secondary | ICD-10-CM | POA: Diagnosis not present

## 2015-02-05 DIAGNOSIS — N186 End stage renal disease: Secondary | ICD-10-CM | POA: Diagnosis not present

## 2015-02-05 DIAGNOSIS — D509 Iron deficiency anemia, unspecified: Secondary | ICD-10-CM | POA: Diagnosis not present

## 2015-02-05 DIAGNOSIS — Z992 Dependence on renal dialysis: Secondary | ICD-10-CM | POA: Diagnosis not present

## 2015-02-05 DIAGNOSIS — N2581 Secondary hyperparathyroidism of renal origin: Secondary | ICD-10-CM | POA: Diagnosis not present

## 2015-02-05 DIAGNOSIS — D631 Anemia in chronic kidney disease: Secondary | ICD-10-CM | POA: Diagnosis not present

## 2015-02-07 DIAGNOSIS — N2581 Secondary hyperparathyroidism of renal origin: Secondary | ICD-10-CM | POA: Diagnosis not present

## 2015-02-07 DIAGNOSIS — D509 Iron deficiency anemia, unspecified: Secondary | ICD-10-CM | POA: Diagnosis not present

## 2015-02-07 DIAGNOSIS — N186 End stage renal disease: Secondary | ICD-10-CM | POA: Diagnosis not present

## 2015-02-07 DIAGNOSIS — D631 Anemia in chronic kidney disease: Secondary | ICD-10-CM | POA: Diagnosis not present

## 2015-02-07 DIAGNOSIS — Z992 Dependence on renal dialysis: Secondary | ICD-10-CM | POA: Diagnosis not present

## 2015-02-09 DIAGNOSIS — N186 End stage renal disease: Secondary | ICD-10-CM | POA: Diagnosis not present

## 2015-02-09 DIAGNOSIS — Z992 Dependence on renal dialysis: Secondary | ICD-10-CM | POA: Diagnosis not present

## 2015-02-09 DIAGNOSIS — N2581 Secondary hyperparathyroidism of renal origin: Secondary | ICD-10-CM | POA: Diagnosis not present

## 2015-02-09 DIAGNOSIS — D509 Iron deficiency anemia, unspecified: Secondary | ICD-10-CM | POA: Diagnosis not present

## 2015-02-09 DIAGNOSIS — D631 Anemia in chronic kidney disease: Secondary | ICD-10-CM | POA: Diagnosis not present

## 2015-02-12 DIAGNOSIS — D631 Anemia in chronic kidney disease: Secondary | ICD-10-CM | POA: Diagnosis not present

## 2015-02-12 DIAGNOSIS — N2581 Secondary hyperparathyroidism of renal origin: Secondary | ICD-10-CM | POA: Diagnosis not present

## 2015-02-12 DIAGNOSIS — N186 End stage renal disease: Secondary | ICD-10-CM | POA: Diagnosis not present

## 2015-02-12 DIAGNOSIS — D509 Iron deficiency anemia, unspecified: Secondary | ICD-10-CM | POA: Diagnosis not present

## 2015-02-12 DIAGNOSIS — Z992 Dependence on renal dialysis: Secondary | ICD-10-CM | POA: Diagnosis not present

## 2015-02-14 DIAGNOSIS — N2581 Secondary hyperparathyroidism of renal origin: Secondary | ICD-10-CM | POA: Diagnosis not present

## 2015-02-14 DIAGNOSIS — Z992 Dependence on renal dialysis: Secondary | ICD-10-CM | POA: Diagnosis not present

## 2015-02-14 DIAGNOSIS — D509 Iron deficiency anemia, unspecified: Secondary | ICD-10-CM | POA: Diagnosis not present

## 2015-02-14 DIAGNOSIS — N186 End stage renal disease: Secondary | ICD-10-CM | POA: Diagnosis not present

## 2015-02-14 DIAGNOSIS — D631 Anemia in chronic kidney disease: Secondary | ICD-10-CM | POA: Diagnosis not present

## 2015-02-16 DIAGNOSIS — D509 Iron deficiency anemia, unspecified: Secondary | ICD-10-CM | POA: Diagnosis not present

## 2015-02-16 DIAGNOSIS — Z992 Dependence on renal dialysis: Secondary | ICD-10-CM | POA: Diagnosis not present

## 2015-02-16 DIAGNOSIS — N186 End stage renal disease: Secondary | ICD-10-CM | POA: Diagnosis not present

## 2015-02-16 DIAGNOSIS — N2581 Secondary hyperparathyroidism of renal origin: Secondary | ICD-10-CM | POA: Diagnosis not present

## 2015-02-16 DIAGNOSIS — D631 Anemia in chronic kidney disease: Secondary | ICD-10-CM | POA: Diagnosis not present

## 2015-03-17 DIAGNOSIS — Z992 Dependence on renal dialysis: Secondary | ICD-10-CM | POA: Diagnosis not present

## 2015-03-17 DIAGNOSIS — N186 End stage renal disease: Secondary | ICD-10-CM | POA: Diagnosis not present

## 2015-03-19 DIAGNOSIS — D631 Anemia in chronic kidney disease: Secondary | ICD-10-CM | POA: Diagnosis not present

## 2015-03-19 DIAGNOSIS — D509 Iron deficiency anemia, unspecified: Secondary | ICD-10-CM | POA: Diagnosis not present

## 2015-03-19 DIAGNOSIS — N186 End stage renal disease: Secondary | ICD-10-CM | POA: Diagnosis not present

## 2015-03-19 DIAGNOSIS — Z992 Dependence on renal dialysis: Secondary | ICD-10-CM | POA: Diagnosis not present

## 2015-03-19 DIAGNOSIS — N2581 Secondary hyperparathyroidism of renal origin: Secondary | ICD-10-CM | POA: Diagnosis not present

## 2015-03-20 DIAGNOSIS — I10 Essential (primary) hypertension: Secondary | ICD-10-CM | POA: Diagnosis not present

## 2015-03-20 DIAGNOSIS — Z993 Dependence on wheelchair: Secondary | ICD-10-CM | POA: Diagnosis not present

## 2015-03-20 DIAGNOSIS — T82318A Breakdown (mechanical) of other vascular grafts, initial encounter: Secondary | ICD-10-CM | POA: Diagnosis not present

## 2015-03-20 DIAGNOSIS — Y841 Kidney dialysis as the cause of abnormal reaction of the patient, or of later complication, without mention of misadventure at the time of the procedure: Secondary | ICD-10-CM | POA: Diagnosis not present

## 2015-03-20 DIAGNOSIS — T82858A Stenosis of vascular prosthetic devices, implants and grafts, initial encounter: Secondary | ICD-10-CM | POA: Diagnosis not present

## 2015-03-20 DIAGNOSIS — N186 End stage renal disease: Secondary | ICD-10-CM | POA: Diagnosis not present

## 2015-03-21 DIAGNOSIS — Z992 Dependence on renal dialysis: Secondary | ICD-10-CM | POA: Diagnosis not present

## 2015-03-21 DIAGNOSIS — D509 Iron deficiency anemia, unspecified: Secondary | ICD-10-CM | POA: Diagnosis not present

## 2015-03-21 DIAGNOSIS — N2581 Secondary hyperparathyroidism of renal origin: Secondary | ICD-10-CM | POA: Diagnosis not present

## 2015-03-21 DIAGNOSIS — D631 Anemia in chronic kidney disease: Secondary | ICD-10-CM | POA: Diagnosis not present

## 2015-03-21 DIAGNOSIS — N186 End stage renal disease: Secondary | ICD-10-CM | POA: Diagnosis not present

## 2015-03-23 DIAGNOSIS — D509 Iron deficiency anemia, unspecified: Secondary | ICD-10-CM | POA: Diagnosis not present

## 2015-03-23 DIAGNOSIS — Z992 Dependence on renal dialysis: Secondary | ICD-10-CM | POA: Diagnosis not present

## 2015-03-23 DIAGNOSIS — N186 End stage renal disease: Secondary | ICD-10-CM | POA: Diagnosis not present

## 2015-03-23 DIAGNOSIS — N2581 Secondary hyperparathyroidism of renal origin: Secondary | ICD-10-CM | POA: Diagnosis not present

## 2015-03-23 DIAGNOSIS — D631 Anemia in chronic kidney disease: Secondary | ICD-10-CM | POA: Diagnosis not present

## 2015-03-26 DIAGNOSIS — N186 End stage renal disease: Secondary | ICD-10-CM | POA: Diagnosis not present

## 2015-03-26 DIAGNOSIS — D631 Anemia in chronic kidney disease: Secondary | ICD-10-CM | POA: Diagnosis not present

## 2015-03-26 DIAGNOSIS — D509 Iron deficiency anemia, unspecified: Secondary | ICD-10-CM | POA: Diagnosis not present

## 2015-03-26 DIAGNOSIS — N2581 Secondary hyperparathyroidism of renal origin: Secondary | ICD-10-CM | POA: Diagnosis not present

## 2015-03-26 DIAGNOSIS — Z992 Dependence on renal dialysis: Secondary | ICD-10-CM | POA: Diagnosis not present

## 2015-03-26 DIAGNOSIS — E119 Type 2 diabetes mellitus without complications: Secondary | ICD-10-CM | POA: Diagnosis not present

## 2015-03-28 DIAGNOSIS — N186 End stage renal disease: Secondary | ICD-10-CM | POA: Diagnosis not present

## 2015-03-28 DIAGNOSIS — Z992 Dependence on renal dialysis: Secondary | ICD-10-CM | POA: Diagnosis not present

## 2015-03-28 DIAGNOSIS — D631 Anemia in chronic kidney disease: Secondary | ICD-10-CM | POA: Diagnosis not present

## 2015-03-28 DIAGNOSIS — N2581 Secondary hyperparathyroidism of renal origin: Secondary | ICD-10-CM | POA: Diagnosis not present

## 2015-03-28 DIAGNOSIS — D509 Iron deficiency anemia, unspecified: Secondary | ICD-10-CM | POA: Diagnosis not present

## 2015-03-30 DIAGNOSIS — N2581 Secondary hyperparathyroidism of renal origin: Secondary | ICD-10-CM | POA: Diagnosis not present

## 2015-03-30 DIAGNOSIS — N186 End stage renal disease: Secondary | ICD-10-CM | POA: Diagnosis not present

## 2015-03-30 DIAGNOSIS — Z992 Dependence on renal dialysis: Secondary | ICD-10-CM | POA: Diagnosis not present

## 2015-03-30 DIAGNOSIS — D631 Anemia in chronic kidney disease: Secondary | ICD-10-CM | POA: Diagnosis not present

## 2015-03-30 DIAGNOSIS — D509 Iron deficiency anemia, unspecified: Secondary | ICD-10-CM | POA: Diagnosis not present

## 2015-04-02 DIAGNOSIS — Z992 Dependence on renal dialysis: Secondary | ICD-10-CM | POA: Diagnosis not present

## 2015-04-02 DIAGNOSIS — N2581 Secondary hyperparathyroidism of renal origin: Secondary | ICD-10-CM | POA: Diagnosis not present

## 2015-04-02 DIAGNOSIS — D509 Iron deficiency anemia, unspecified: Secondary | ICD-10-CM | POA: Diagnosis not present

## 2015-04-02 DIAGNOSIS — D631 Anemia in chronic kidney disease: Secondary | ICD-10-CM | POA: Diagnosis not present

## 2015-04-02 DIAGNOSIS — N186 End stage renal disease: Secondary | ICD-10-CM | POA: Diagnosis not present

## 2015-04-04 DIAGNOSIS — D509 Iron deficiency anemia, unspecified: Secondary | ICD-10-CM | POA: Diagnosis not present

## 2015-04-04 DIAGNOSIS — D631 Anemia in chronic kidney disease: Secondary | ICD-10-CM | POA: Diagnosis not present

## 2015-04-04 DIAGNOSIS — N186 End stage renal disease: Secondary | ICD-10-CM | POA: Diagnosis not present

## 2015-04-04 DIAGNOSIS — Z992 Dependence on renal dialysis: Secondary | ICD-10-CM | POA: Diagnosis not present

## 2015-04-04 DIAGNOSIS — N2581 Secondary hyperparathyroidism of renal origin: Secondary | ICD-10-CM | POA: Diagnosis not present

## 2015-04-06 DIAGNOSIS — N186 End stage renal disease: Secondary | ICD-10-CM | POA: Diagnosis not present

## 2015-04-06 DIAGNOSIS — Z992 Dependence on renal dialysis: Secondary | ICD-10-CM | POA: Diagnosis not present

## 2015-04-06 DIAGNOSIS — N2581 Secondary hyperparathyroidism of renal origin: Secondary | ICD-10-CM | POA: Diagnosis not present

## 2015-04-06 DIAGNOSIS — D509 Iron deficiency anemia, unspecified: Secondary | ICD-10-CM | POA: Diagnosis not present

## 2015-04-06 DIAGNOSIS — D631 Anemia in chronic kidney disease: Secondary | ICD-10-CM | POA: Diagnosis not present

## 2015-04-09 DIAGNOSIS — N2581 Secondary hyperparathyroidism of renal origin: Secondary | ICD-10-CM | POA: Diagnosis not present

## 2015-04-09 DIAGNOSIS — Z992 Dependence on renal dialysis: Secondary | ICD-10-CM | POA: Diagnosis not present

## 2015-04-09 DIAGNOSIS — D631 Anemia in chronic kidney disease: Secondary | ICD-10-CM | POA: Diagnosis not present

## 2015-04-09 DIAGNOSIS — N186 End stage renal disease: Secondary | ICD-10-CM | POA: Diagnosis not present

## 2015-04-09 DIAGNOSIS — D509 Iron deficiency anemia, unspecified: Secondary | ICD-10-CM | POA: Diagnosis not present

## 2015-04-13 ENCOUNTER — Ambulatory Visit
Admission: RE | Admit: 2015-04-13 | Discharge: 2015-04-13 | Disposition: A | Discharge status: Home / Self Care | Payer: Medicare Other | Source: Ambulatory Visit | Attending: Vascular Surgery | Admitting: Vascular Surgery

## 2015-04-13 ENCOUNTER — Encounter: Payer: Self-pay | Admitting: *Deleted

## 2015-04-13 ENCOUNTER — Encounter
Admission: RE | Disposition: A | Discharge status: Home / Self Care | Payer: Self-pay | Source: Ambulatory Visit | Attending: Vascular Surgery

## 2015-04-13 ENCOUNTER — Other Ambulatory Visit: Payer: Self-pay | Admitting: Vascular Surgery

## 2015-04-13 DIAGNOSIS — T82868A Thrombosis of vascular prosthetic devices, implants and grafts, initial encounter: Secondary | ICD-10-CM | POA: Insufficient documentation

## 2015-04-13 DIAGNOSIS — Z7982 Long term (current) use of aspirin: Secondary | ICD-10-CM | POA: Insufficient documentation

## 2015-04-13 DIAGNOSIS — Y841 Kidney dialysis as the cause of abnormal reaction of the patient, or of later complication, without mention of misadventure at the time of the procedure: Secondary | ICD-10-CM | POA: Diagnosis not present

## 2015-04-13 DIAGNOSIS — F17290 Nicotine dependence, other tobacco product, uncomplicated: Secondary | ICD-10-CM | POA: Diagnosis not present

## 2015-04-13 DIAGNOSIS — Z992 Dependence on renal dialysis: Secondary | ICD-10-CM | POA: Insufficient documentation

## 2015-04-13 DIAGNOSIS — Y832 Surgical operation with anastomosis, bypass or graft as the cause of abnormal reaction of the patient, or of later complication, without mention of misadventure at the time of the procedure: Secondary | ICD-10-CM | POA: Diagnosis not present

## 2015-04-13 DIAGNOSIS — N186 End stage renal disease: Secondary | ICD-10-CM | POA: Insufficient documentation

## 2015-04-13 DIAGNOSIS — I10 Essential (primary) hypertension: Secondary | ICD-10-CM | POA: Diagnosis not present

## 2015-04-13 DIAGNOSIS — I120 Hypertensive chronic kidney disease with stage 5 chronic kidney disease or end stage renal disease: Secondary | ICD-10-CM | POA: Insufficient documentation

## 2015-04-13 DIAGNOSIS — Z79899 Other long term (current) drug therapy: Secondary | ICD-10-CM | POA: Insufficient documentation

## 2015-04-13 DIAGNOSIS — Z86718 Personal history of other venous thrombosis and embolism: Secondary | ICD-10-CM | POA: Insufficient documentation

## 2015-04-13 DIAGNOSIS — T82858A Stenosis of vascular prosthetic devices, implants and grafts, initial encounter: Secondary | ICD-10-CM | POA: Diagnosis not present

## 2015-04-13 DIAGNOSIS — T82318A Breakdown (mechanical) of other vascular grafts, initial encounter: Secondary | ICD-10-CM | POA: Diagnosis not present

## 2015-04-13 HISTORY — PX: PERIPHERAL VASCULAR CATHETERIZATION: SHX172C

## 2015-04-13 LAB — POTASSIUM: POTASSIUM: 5.6 mmol/L — AB (ref 3.5–5.1)

## 2015-04-13 LAB — HCG, QUANTITATIVE, PREGNANCY: HCG, BETA CHAIN, QUANT, S: 1 m[IU]/mL (ref <5)

## 2015-04-13 SURGERY — A/V SHUNTOGRAM/FISTULAGRAM
Anesthesia: Moderate Sedation | Laterality: Left

## 2015-04-13 MED ORDER — HEPARIN (PORCINE) IN NACL 2-0.9 UNIT/ML-% IJ SOLN
INTRAMUSCULAR | Status: AC
  Filled 2015-04-13: qty 500

## 2015-04-13 MED ORDER — IOHEXOL 300 MG/ML  SOLN
INTRAMUSCULAR | Status: DC | PRN
  Administered 2015-04-13: 30 mL

## 2015-04-13 MED ORDER — HEPARIN (PORCINE) IN NACL 2-0.9 UNIT/ML-% IJ SOLN
INTRAMUSCULAR | Status: AC
  Filled 2015-04-13: qty 1000

## 2015-04-13 MED ORDER — MIDAZOLAM HCL 2 MG/2ML IJ SOLN
INTRAMUSCULAR | Status: AC
  Filled 2015-04-13: qty 2

## 2015-04-13 MED ORDER — SODIUM CHLORIDE 0.9 % IV SOLN: INTRAVENOUS | Status: DC

## 2015-04-13 MED ORDER — HEPARIN SODIUM (PORCINE) 1000 UNIT/ML IJ SOLN
INTRAMUSCULAR | Status: DC | PRN
  Administered 2015-04-13: 4000 [IU] via INTRAVENOUS

## 2015-04-13 MED ORDER — MIDAZOLAM HCL 2 MG/2ML IJ SOLN
INTRAMUSCULAR | Status: DC | PRN
  Administered 2015-04-13: 2 mg via INTRAVENOUS

## 2015-04-13 MED ORDER — LIDOCAINE-EPINEPHRINE (PF) 1 %-1:200000 IJ SOLN
INTRAMUSCULAR | Status: AC
  Filled 2015-04-13: qty 30

## 2015-04-13 MED ORDER — HEPARIN SODIUM (PORCINE) 10000 UNIT/ML IJ SOLN
INTRAMUSCULAR | Status: AC
  Filled 2015-04-13: qty 1

## 2015-04-13 MED ORDER — FENTANYL CITRATE (PF) 100 MCG/2ML IJ SOLN
INTRAMUSCULAR | Status: DC | PRN
  Administered 2015-04-13: 100 ug via INTRAVENOUS

## 2015-04-13 MED ORDER — FENTANYL CITRATE (PF) 100 MCG/2ML IJ SOLN
INTRAMUSCULAR | Status: AC
  Filled 2015-04-13: qty 2

## 2015-04-13 MED ORDER — LIDOCAINE HCL (PF) 1 % IJ SOLN
INTRAMUSCULAR | Status: AC
  Filled 2015-04-13: qty 30

## 2015-04-13 MED ORDER — DEXTROSE 5 % IV SOLN
1.5000 g | INTRAVENOUS | Status: AC
  Administered 2015-04-13: 1.5 g via INTRAVENOUS

## 2015-04-13 SURGICAL SUPPLY — 15 items
BALLN DORADO 8X100X80 (BALLOONS) ×2
BALLOON DORADO 8X100X80 (BALLOONS) IMPLANT
CATH TORCON 5FR 0.38 (CATHETERS) ×1 IMPLANT
DEVICE PRESTO INFLATION (MISCELLANEOUS) ×1 IMPLANT
DRAPE BRACHIAL (DRAPES) ×2 IMPLANT
KIT THROMB PERC PTD (MISCELLANEOUS) ×1 IMPLANT
PACK ANGIOGRAPHY (CUSTOM PROCEDURE TRAY) ×2 IMPLANT
SET INTRO CAPELLA COAXIAL (SET/KITS/TRAYS/PACK) ×2 IMPLANT
SHEATH BRITE TIP 6FRX5.5 (SHEATH) ×3 IMPLANT
SHEATH BRITE TIP 7FRX5.5 (SHEATH) ×1 IMPLANT
STENT VIABAHN 8X100X120 (Permanent Stent) ×2 IMPLANT
STENT VIABAHN 8X10X120 (Permanent Stent) IMPLANT
TOWEL OR 17X26 4PK STRL BLUE (TOWEL DISPOSABLE) ×2 IMPLANT
WIRE G 018X200 V18 (WIRE) ×1 IMPLANT
WIRE MAGIC TOR.035 180C (WIRE) ×1 IMPLANT

## 2015-04-13 NOTE — Op Note (Signed)
OPERATIVE NOTE   PROCEDURE: 1. Contrast injection left arm hero graft 2. Mechanical thrombectomy with Trerotola left arm hero graft 3. Percutaneous transluminal angioplasty and stent placement left arm hero graft  PRE-OPERATIVE DIAGNOSIS: Complication of dialysis access with thrombosis left arm hero graft                                                       End Stage Renal Disease  POST-OPERATIVE DIAGNOSIS: same as above   SURGEON: Katha Cabal, M.D.  ANESTHESIA: Conscious Sedation   ESTIMATED BLOOD LOSS: minimal  FINDING(S): 1. Doses left arm hero graft  SPECIMEN(S):  None  CONTRAST: 30 cc  FLUOROSCOPY TIME: 8 minutes  INDICATIONS: [name] is a 40 y.o. female who  presents with malfunctioning left arm AV access.  The patient is scheduled for angiography with possible intervention of the AV access.  The patient is aware the risks include but are not limited to: bleeding, infection, thrombosis of the cannulated access, and possible anaphylactic reaction to the contrast.  The patient acknowledges if the access can not be salvaged a tunneled catheter will be needed and will be placed during this procedure.  The patient is aware of the risks of the procedure and elects to proceed with the angiogram and intervention.  DESCRIPTION: After full informed written consent was obtained, the patient was brought back to the Special Procedure suite and placed supine position.  Appropriate cardiopulmonary monitors were placed.  The left arm was prepped and draped in the standard fashion.  Appropriate timeout is called. The hero graft  was cannulated with a micropuncture needle.  The microwire was advanced and the needle was exchanged for  a microsheath.  The J-wire was then advanced and a 6 Fr sheath inserted.  Hand was then performed which demonstrated thrombus within the AV access.  The central venous structures were also imaged by hand injections.  3000 units of heparin  was given and allowed to circulate as well.  A Trerotola device was then advanced beginning centrally and pulling back performing mechanical thrombectomy of the venous portion.  Several passes were made through the venous portion of the graft. Follow-up imaging now demonstrates the vast majority of the clot had been treated. Therefore a retrograde sheath was inserted. This too was a 6 [place] sheath was positioned more proximally on the arm and angled in the retrograde direction. Subsequently a floppy Glidewire and a KMP catheter were negotiated into the arterial system hand injection contrast was then utilized to demonstrate patency of the artery as well as the location for the anastomosis. The Trerotola device was now advanced through the retrograde sheath was extended out into the artery the basket was opened and it was slowly pulled back into the graft and then the basket was engaged. Several passes were made on the arterial portion and pulsatility of the access was reestablished. Follow-up imaging demonstrates there was now thrombus in the venous portion surrounding the sheath and this was treated with the Trerotola device from the antegrade direction. After several passes imaging demonstrated resolution of thrombus within the graft and forward flow had been reestablished.   However stricture of the graft was noted area areas of the venous portion  Magic torque wire was then advanced through the antegrade sheath and a Kumpe catheter was used to  exchange for an 018 wire after a 7 French sheath had been inserted exchanged for the 6 French. Subsequent leg, an 8 x 10 cm Viabahn stent was deployed and an 8 x 10 Dorado balloon was used to angioplasty the venous portion of the AV access. Multiple inflations were performed inflation times with 30 seconds to 1 minute with maximum pressures of 20-36 ATM.  With the balloon inflated reflux of contrast was performed demonstrating the arterial anastomosis. The arterial  anastomosis was widely patent. Contrast was then injected in the forward direction demonstrating rapid flow.  A 4-0 Monocryl purse-string suture was sewn around both of the sheaths.  The sheaths were removed and light pressure was applied.  A sterile bandage was applied to the puncture site.    COMPLICATIONS: None   CONDITION: Good  Gregory G. Schnier, M.D [facility] Office: [phone]  04/13/2015 3:41 PM 

## 2015-04-13 NOTE — H&P (Addendum)
OPERATIVE NOTE   PROCEDURE: 1. Contrast injection left arm hero graft 2. Mechanical thrombectomy with Trerotola left arm hero graft 3. Percutaneous transluminal angioplasty and stent placement left arm hero graft  PRE-OPERATIVE DIAGNOSIS: Complication of dialysis access with thrombosis left arm hero graft                                                       End Stage Renal Disease  POST-OPERATIVE DIAGNOSIS: same as above   SURGEON: Alvy Alsop G. Corinthian Kemler, M.D.  ANESTHESIA: Conscious Sedation   ESTIMATED BLOOD LOSS: minimal  FINDING(S): 1. Doses left arm hero graft  SPECIMEN(S):  None  CONTRAST: 30 cc  FLUOROSCOPY TIME: 8 minutes  INDICATIONS: [name] is a 40 y.o. female who  presents with malfunctioning left arm AV access.  The patient is scheduled for angiography with possible intervention of the AV access.  The patient is aware the risks include but are not limited to: bleeding, infection, thrombosis of the cannulated access, and possible anaphylactic reaction to the contrast.  The patient acknowledges if the access can not be salvaged a tunneled catheter will be needed and will be placed during this procedure.  The patient is aware of the risks of the procedure and elects to proceed with the angiogram and intervention.  DESCRIPTION: After full informed written consent was obtained, the patient was brought back to the Special Procedure suite and placed supine position.  Appropriate cardiopulmonary monitors were placed.  The left arm was prepped and draped in the standard fashion.  Appropriate timeout is called. The hero graft  was cannulated with a micropuncture needle.  The microwire was advanced and the needle was exchanged for  a microsheath.  The J-wire was then advanced and a 6 Fr sheath inserted.  Hand was then performed which demonstrated thrombus within the AV access.  The central venous structures were also imaged by hand injections.  3000 units of heparin  was given and allowed to circulate as well.  A Trerotola device was then advanced beginning centrally and pulling back performing mechanical thrombectomy of the venous portion.  Several passes were made through the venous portion of the graft. Follow-up imaging now demonstrates the vast majority of the clot had been treated. Therefore a retrograde sheath was inserted. This too was a 6 French sheath was positioned more proximally on the arm and angled in the retrograde direction. Subsequently a floppy Glidewire and a KMP catheter were negotiated into the arterial system hand injection contrast was then utilized to demonstrate patency of the artery as well as the location for the anastomosis. The Trerotola device was now advanced through the retrograde sheath was extended out into the artery the basket was opened and it was slowly pulled back into the graft and then the basket was engaged. Several passes were made on the arterial portion and pulsatility of the access was reestablished. Follow-up imaging demonstrates there was now thrombus in the venous portion surrounding the sheath and this was treated with the Trerotola device from the antegrade direction. After several passes imaging demonstrated resolution of thrombus within the graft and forward flow had been reestablished.   However stricture of the graft was noted area areas of the venous portion  Magic torque wire was then advanced through the antegrade sheath and a Kumpe catheter was used to   exchange for an 018 wire after a 7 French sheath had been inserted exchanged for the 6 French. Subsequent leg, an 8 x 10 cm Viabahn stent was deployed and an 8 x 10 Dor[place] balloon was used to angioplasty the venous portion of the AV access. Multiple inflations were performed inflation times with 30 seconds to 1 minute with maximum pressures of 20-36 ATM.  With the balloon inflated reflux of contrast was performed demonstrating the arterial anastomosis. The arterial  anastomosis was widely patent. Contrast was then injected in the forward direction demonstrating rapid flow.  A 4-0 Monocryl purse-string suture was sewn around both of the sheaths.  The sheaths were removed and light pressure was applied.  A sterile bandage was applied to the puncture site.    COMPLICATIONS: None   CONDITION: Good  Gregory G. Schnier, M.D [facility] Office: [phone]  04/13/2015 3:41 PM 

## 2015-04-13 NOTE — H&P (Signed)
[facility] History & Physical Update  The patient was interviewed and re-examined.  The patient's previous History and Physical has been reviewed and is unchanged.  There is no change in the plan of care. We plan to proceed with the scheduled procedure.  Schnier, Dolores Lory, MD  04/13/2015, 3:46 PM

## 2015-04-14 DIAGNOSIS — N2581 Secondary hyperparathyroidism of renal origin: Secondary | ICD-10-CM | POA: Diagnosis not present

## 2015-04-14 DIAGNOSIS — Z992 Dependence on renal dialysis: Secondary | ICD-10-CM | POA: Diagnosis not present

## 2015-04-14 DIAGNOSIS — D631 Anemia in chronic kidney disease: Secondary | ICD-10-CM | POA: Diagnosis not present

## 2015-04-14 DIAGNOSIS — D509 Iron deficiency anemia, unspecified: Secondary | ICD-10-CM | POA: Diagnosis not present

## 2015-04-14 DIAGNOSIS — N186 End stage renal disease: Secondary | ICD-10-CM | POA: Diagnosis not present

## 2015-04-16 ENCOUNTER — Encounter: Payer: Self-pay | Admitting: Vascular Surgery

## 2015-04-16 DIAGNOSIS — Z992 Dependence on renal dialysis: Secondary | ICD-10-CM | POA: Diagnosis not present

## 2015-04-16 DIAGNOSIS — N2581 Secondary hyperparathyroidism of renal origin: Secondary | ICD-10-CM | POA: Diagnosis not present

## 2015-04-16 DIAGNOSIS — N186 End stage renal disease: Secondary | ICD-10-CM | POA: Diagnosis not present

## 2015-04-16 DIAGNOSIS — D631 Anemia in chronic kidney disease: Secondary | ICD-10-CM | POA: Diagnosis not present

## 2015-04-16 DIAGNOSIS — D509 Iron deficiency anemia, unspecified: Secondary | ICD-10-CM | POA: Diagnosis not present

## 2015-04-17 DIAGNOSIS — N186 End stage renal disease: Secondary | ICD-10-CM | POA: Diagnosis not present

## 2015-04-17 DIAGNOSIS — Z992 Dependence on renal dialysis: Secondary | ICD-10-CM | POA: Diagnosis not present

## 2015-04-18 DIAGNOSIS — Z992 Dependence on renal dialysis: Secondary | ICD-10-CM | POA: Diagnosis not present

## 2015-04-18 DIAGNOSIS — N186 End stage renal disease: Secondary | ICD-10-CM | POA: Diagnosis not present

## 2015-04-18 DIAGNOSIS — N2581 Secondary hyperparathyroidism of renal origin: Secondary | ICD-10-CM | POA: Diagnosis not present

## 2015-04-18 DIAGNOSIS — D631 Anemia in chronic kidney disease: Secondary | ICD-10-CM | POA: Diagnosis not present

## 2015-04-18 DIAGNOSIS — D509 Iron deficiency anemia, unspecified: Secondary | ICD-10-CM | POA: Diagnosis not present

## 2015-04-20 DIAGNOSIS — Z992 Dependence on renal dialysis: Secondary | ICD-10-CM | POA: Diagnosis not present

## 2015-04-20 DIAGNOSIS — D631 Anemia in chronic kidney disease: Secondary | ICD-10-CM | POA: Diagnosis not present

## 2015-04-20 DIAGNOSIS — D509 Iron deficiency anemia, unspecified: Secondary | ICD-10-CM | POA: Diagnosis not present

## 2015-04-20 DIAGNOSIS — N2581 Secondary hyperparathyroidism of renal origin: Secondary | ICD-10-CM | POA: Diagnosis not present

## 2015-04-20 DIAGNOSIS — N186 End stage renal disease: Secondary | ICD-10-CM | POA: Diagnosis not present

## 2015-04-23 DIAGNOSIS — N186 End stage renal disease: Secondary | ICD-10-CM | POA: Diagnosis not present

## 2015-04-23 DIAGNOSIS — Z992 Dependence on renal dialysis: Secondary | ICD-10-CM | POA: Diagnosis not present

## 2015-04-23 DIAGNOSIS — D631 Anemia in chronic kidney disease: Secondary | ICD-10-CM | POA: Diagnosis not present

## 2015-04-23 DIAGNOSIS — N2581 Secondary hyperparathyroidism of renal origin: Secondary | ICD-10-CM | POA: Diagnosis not present

## 2015-04-23 DIAGNOSIS — D509 Iron deficiency anemia, unspecified: Secondary | ICD-10-CM | POA: Diagnosis not present

## 2015-04-25 DIAGNOSIS — D631 Anemia in chronic kidney disease: Secondary | ICD-10-CM | POA: Diagnosis not present

## 2015-04-25 DIAGNOSIS — D509 Iron deficiency anemia, unspecified: Secondary | ICD-10-CM | POA: Diagnosis not present

## 2015-04-25 DIAGNOSIS — N186 End stage renal disease: Secondary | ICD-10-CM | POA: Diagnosis not present

## 2015-04-25 DIAGNOSIS — Z992 Dependence on renal dialysis: Secondary | ICD-10-CM | POA: Diagnosis not present

## 2015-04-25 DIAGNOSIS — N2581 Secondary hyperparathyroidism of renal origin: Secondary | ICD-10-CM | POA: Diagnosis not present

## 2015-04-27 DIAGNOSIS — N2581 Secondary hyperparathyroidism of renal origin: Secondary | ICD-10-CM | POA: Diagnosis not present

## 2015-04-27 DIAGNOSIS — D631 Anemia in chronic kidney disease: Secondary | ICD-10-CM | POA: Diagnosis not present

## 2015-04-27 DIAGNOSIS — Z992 Dependence on renal dialysis: Secondary | ICD-10-CM | POA: Diagnosis not present

## 2015-04-27 DIAGNOSIS — N186 End stage renal disease: Secondary | ICD-10-CM | POA: Diagnosis not present

## 2015-04-27 DIAGNOSIS — D509 Iron deficiency anemia, unspecified: Secondary | ICD-10-CM | POA: Diagnosis not present

## 2015-04-30 DIAGNOSIS — D509 Iron deficiency anemia, unspecified: Secondary | ICD-10-CM | POA: Diagnosis not present

## 2015-04-30 DIAGNOSIS — N186 End stage renal disease: Secondary | ICD-10-CM | POA: Diagnosis not present

## 2015-04-30 DIAGNOSIS — N2581 Secondary hyperparathyroidism of renal origin: Secondary | ICD-10-CM | POA: Diagnosis not present

## 2015-04-30 DIAGNOSIS — Z992 Dependence on renal dialysis: Secondary | ICD-10-CM | POA: Diagnosis not present

## 2015-04-30 DIAGNOSIS — D631 Anemia in chronic kidney disease: Secondary | ICD-10-CM | POA: Diagnosis not present

## 2015-05-02 DIAGNOSIS — D509 Iron deficiency anemia, unspecified: Secondary | ICD-10-CM | POA: Diagnosis not present

## 2015-05-02 DIAGNOSIS — D631 Anemia in chronic kidney disease: Secondary | ICD-10-CM | POA: Diagnosis not present

## 2015-05-02 DIAGNOSIS — N2581 Secondary hyperparathyroidism of renal origin: Secondary | ICD-10-CM | POA: Diagnosis not present

## 2015-05-02 DIAGNOSIS — N186 End stage renal disease: Secondary | ICD-10-CM | POA: Diagnosis not present

## 2015-05-02 DIAGNOSIS — Z992 Dependence on renal dialysis: Secondary | ICD-10-CM | POA: Diagnosis not present

## 2015-05-03 DIAGNOSIS — Y841 Kidney dialysis as the cause of abnormal reaction of the patient, or of later complication, without mention of misadventure at the time of the procedure: Secondary | ICD-10-CM | POA: Diagnosis not present

## 2015-05-03 DIAGNOSIS — T82858A Stenosis of vascular prosthetic devices, implants and grafts, initial encounter: Secondary | ICD-10-CM | POA: Diagnosis not present

## 2015-05-03 DIAGNOSIS — N186 End stage renal disease: Secondary | ICD-10-CM | POA: Diagnosis not present

## 2015-05-03 DIAGNOSIS — T82318A Breakdown (mechanical) of other vascular grafts, initial encounter: Secondary | ICD-10-CM | POA: Diagnosis not present

## 2015-05-03 DIAGNOSIS — Z992 Dependence on renal dialysis: Secondary | ICD-10-CM | POA: Diagnosis not present

## 2015-05-03 DIAGNOSIS — I10 Essential (primary) hypertension: Secondary | ICD-10-CM | POA: Diagnosis not present

## 2015-05-04 DIAGNOSIS — Z992 Dependence on renal dialysis: Secondary | ICD-10-CM | POA: Diagnosis not present

## 2015-05-04 DIAGNOSIS — D509 Iron deficiency anemia, unspecified: Secondary | ICD-10-CM | POA: Diagnosis not present

## 2015-05-04 DIAGNOSIS — D631 Anemia in chronic kidney disease: Secondary | ICD-10-CM | POA: Diagnosis not present

## 2015-05-04 DIAGNOSIS — N186 End stage renal disease: Secondary | ICD-10-CM | POA: Diagnosis not present

## 2015-05-04 DIAGNOSIS — N2581 Secondary hyperparathyroidism of renal origin: Secondary | ICD-10-CM | POA: Diagnosis not present

## 2015-05-08 DIAGNOSIS — N186 End stage renal disease: Secondary | ICD-10-CM | POA: Diagnosis not present

## 2015-05-08 DIAGNOSIS — Z992 Dependence on renal dialysis: Secondary | ICD-10-CM | POA: Diagnosis not present

## 2015-05-08 DIAGNOSIS — D509 Iron deficiency anemia, unspecified: Secondary | ICD-10-CM | POA: Diagnosis not present

## 2015-05-08 DIAGNOSIS — N2581 Secondary hyperparathyroidism of renal origin: Secondary | ICD-10-CM | POA: Diagnosis not present

## 2015-05-08 DIAGNOSIS — D631 Anemia in chronic kidney disease: Secondary | ICD-10-CM | POA: Diagnosis not present

## 2015-05-09 DIAGNOSIS — N186 End stage renal disease: Secondary | ICD-10-CM | POA: Diagnosis not present

## 2015-05-09 DIAGNOSIS — D631 Anemia in chronic kidney disease: Secondary | ICD-10-CM | POA: Diagnosis not present

## 2015-05-09 DIAGNOSIS — Z992 Dependence on renal dialysis: Secondary | ICD-10-CM | POA: Diagnosis not present

## 2015-05-09 DIAGNOSIS — N2581 Secondary hyperparathyroidism of renal origin: Secondary | ICD-10-CM | POA: Diagnosis not present

## 2015-05-09 DIAGNOSIS — D509 Iron deficiency anemia, unspecified: Secondary | ICD-10-CM | POA: Diagnosis not present

## 2015-05-11 DIAGNOSIS — N2581 Secondary hyperparathyroidism of renal origin: Secondary | ICD-10-CM | POA: Diagnosis not present

## 2015-05-11 DIAGNOSIS — D631 Anemia in chronic kidney disease: Secondary | ICD-10-CM | POA: Diagnosis not present

## 2015-05-11 DIAGNOSIS — D509 Iron deficiency anemia, unspecified: Secondary | ICD-10-CM | POA: Diagnosis not present

## 2015-05-11 DIAGNOSIS — Z992 Dependence on renal dialysis: Secondary | ICD-10-CM | POA: Diagnosis not present

## 2015-05-11 DIAGNOSIS — N186 End stage renal disease: Secondary | ICD-10-CM | POA: Diagnosis not present

## 2015-05-14 DIAGNOSIS — N2581 Secondary hyperparathyroidism of renal origin: Secondary | ICD-10-CM | POA: Diagnosis not present

## 2015-05-14 DIAGNOSIS — Z992 Dependence on renal dialysis: Secondary | ICD-10-CM | POA: Diagnosis not present

## 2015-05-14 DIAGNOSIS — D631 Anemia in chronic kidney disease: Secondary | ICD-10-CM | POA: Diagnosis not present

## 2015-05-14 DIAGNOSIS — N186 End stage renal disease: Secondary | ICD-10-CM | POA: Diagnosis not present

## 2015-05-14 DIAGNOSIS — D509 Iron deficiency anemia, unspecified: Secondary | ICD-10-CM | POA: Diagnosis not present

## 2015-05-15 DIAGNOSIS — Z992 Dependence on renal dialysis: Secondary | ICD-10-CM | POA: Diagnosis not present

## 2015-05-15 DIAGNOSIS — N186 End stage renal disease: Secondary | ICD-10-CM | POA: Diagnosis not present

## 2015-05-16 DIAGNOSIS — N2581 Secondary hyperparathyroidism of renal origin: Secondary | ICD-10-CM | POA: Diagnosis not present

## 2015-05-16 DIAGNOSIS — D631 Anemia in chronic kidney disease: Secondary | ICD-10-CM | POA: Diagnosis not present

## 2015-05-16 DIAGNOSIS — Z992 Dependence on renal dialysis: Secondary | ICD-10-CM | POA: Diagnosis not present

## 2015-05-16 DIAGNOSIS — D509 Iron deficiency anemia, unspecified: Secondary | ICD-10-CM | POA: Diagnosis not present

## 2015-05-16 DIAGNOSIS — N186 End stage renal disease: Secondary | ICD-10-CM | POA: Diagnosis not present

## 2015-05-18 DIAGNOSIS — N2581 Secondary hyperparathyroidism of renal origin: Secondary | ICD-10-CM | POA: Diagnosis not present

## 2015-05-18 DIAGNOSIS — N186 End stage renal disease: Secondary | ICD-10-CM | POA: Diagnosis not present

## 2015-05-18 DIAGNOSIS — D509 Iron deficiency anemia, unspecified: Secondary | ICD-10-CM | POA: Diagnosis not present

## 2015-05-18 DIAGNOSIS — Z992 Dependence on renal dialysis: Secondary | ICD-10-CM | POA: Diagnosis not present

## 2015-05-18 DIAGNOSIS — D631 Anemia in chronic kidney disease: Secondary | ICD-10-CM | POA: Diagnosis not present

## 2015-05-21 DIAGNOSIS — Z992 Dependence on renal dialysis: Secondary | ICD-10-CM | POA: Diagnosis not present

## 2015-05-21 DIAGNOSIS — N186 End stage renal disease: Secondary | ICD-10-CM | POA: Diagnosis not present

## 2015-05-21 DIAGNOSIS — D509 Iron deficiency anemia, unspecified: Secondary | ICD-10-CM | POA: Diagnosis not present

## 2015-05-21 DIAGNOSIS — D631 Anemia in chronic kidney disease: Secondary | ICD-10-CM | POA: Diagnosis not present

## 2015-05-21 DIAGNOSIS — N2581 Secondary hyperparathyroidism of renal origin: Secondary | ICD-10-CM | POA: Diagnosis not present

## 2015-05-23 DIAGNOSIS — D631 Anemia in chronic kidney disease: Secondary | ICD-10-CM | POA: Diagnosis not present

## 2015-05-23 DIAGNOSIS — D509 Iron deficiency anemia, unspecified: Secondary | ICD-10-CM | POA: Diagnosis not present

## 2015-05-23 DIAGNOSIS — N2581 Secondary hyperparathyroidism of renal origin: Secondary | ICD-10-CM | POA: Diagnosis not present

## 2015-05-23 DIAGNOSIS — N186 End stage renal disease: Secondary | ICD-10-CM | POA: Diagnosis not present

## 2015-05-23 DIAGNOSIS — Z992 Dependence on renal dialysis: Secondary | ICD-10-CM | POA: Diagnosis not present

## 2015-05-25 DIAGNOSIS — N186 End stage renal disease: Secondary | ICD-10-CM | POA: Diagnosis not present

## 2015-05-25 DIAGNOSIS — N2581 Secondary hyperparathyroidism of renal origin: Secondary | ICD-10-CM | POA: Diagnosis not present

## 2015-05-25 DIAGNOSIS — D509 Iron deficiency anemia, unspecified: Secondary | ICD-10-CM | POA: Diagnosis not present

## 2015-05-25 DIAGNOSIS — Z992 Dependence on renal dialysis: Secondary | ICD-10-CM | POA: Diagnosis not present

## 2015-05-25 DIAGNOSIS — D631 Anemia in chronic kidney disease: Secondary | ICD-10-CM | POA: Diagnosis not present

## 2015-05-28 DIAGNOSIS — Z992 Dependence on renal dialysis: Secondary | ICD-10-CM | POA: Diagnosis not present

## 2015-05-28 DIAGNOSIS — N2581 Secondary hyperparathyroidism of renal origin: Secondary | ICD-10-CM | POA: Diagnosis not present

## 2015-05-28 DIAGNOSIS — N186 End stage renal disease: Secondary | ICD-10-CM | POA: Diagnosis not present

## 2015-05-28 DIAGNOSIS — D509 Iron deficiency anemia, unspecified: Secondary | ICD-10-CM | POA: Diagnosis not present

## 2015-05-28 DIAGNOSIS — D631 Anemia in chronic kidney disease: Secondary | ICD-10-CM | POA: Diagnosis not present

## 2015-05-30 DIAGNOSIS — Z992 Dependence on renal dialysis: Secondary | ICD-10-CM | POA: Diagnosis not present

## 2015-05-30 DIAGNOSIS — N186 End stage renal disease: Secondary | ICD-10-CM | POA: Diagnosis not present

## 2015-05-30 DIAGNOSIS — D509 Iron deficiency anemia, unspecified: Secondary | ICD-10-CM | POA: Diagnosis not present

## 2015-05-30 DIAGNOSIS — N2581 Secondary hyperparathyroidism of renal origin: Secondary | ICD-10-CM | POA: Diagnosis not present

## 2015-05-30 DIAGNOSIS — D631 Anemia in chronic kidney disease: Secondary | ICD-10-CM | POA: Diagnosis not present

## 2015-06-01 DIAGNOSIS — D509 Iron deficiency anemia, unspecified: Secondary | ICD-10-CM | POA: Diagnosis not present

## 2015-06-01 DIAGNOSIS — D631 Anemia in chronic kidney disease: Secondary | ICD-10-CM | POA: Diagnosis not present

## 2015-06-01 DIAGNOSIS — Z992 Dependence on renal dialysis: Secondary | ICD-10-CM | POA: Diagnosis not present

## 2015-06-01 DIAGNOSIS — N2581 Secondary hyperparathyroidism of renal origin: Secondary | ICD-10-CM | POA: Diagnosis not present

## 2015-06-01 DIAGNOSIS — N186 End stage renal disease: Secondary | ICD-10-CM | POA: Diagnosis not present

## 2015-06-04 ENCOUNTER — Emergency Department (HOSPITAL_COMMUNITY)
Admission: EM | Admit: 2015-06-04 | Discharge: 2015-06-04 | Disposition: A | Discharge status: Home / Self Care | Payer: Medicare Other | Attending: Emergency Medicine | Admitting: Emergency Medicine

## 2015-06-04 ENCOUNTER — Emergency Department (HOSPITAL_COMMUNITY): Payer: Medicare Other

## 2015-06-04 ENCOUNTER — Encounter (HOSPITAL_COMMUNITY): Payer: Self-pay | Admitting: Emergency Medicine

## 2015-06-04 DIAGNOSIS — M25572 Pain in left ankle and joints of left foot: Secondary | ICD-10-CM | POA: Diagnosis not present

## 2015-06-04 DIAGNOSIS — S99922A Unspecified injury of left foot, initial encounter: Secondary | ICD-10-CM | POA: Diagnosis not present

## 2015-06-04 DIAGNOSIS — I10 Essential (primary) hypertension: Secondary | ICD-10-CM | POA: Insufficient documentation

## 2015-06-04 DIAGNOSIS — Y939 Activity, unspecified: Secondary | ICD-10-CM | POA: Insufficient documentation

## 2015-06-04 DIAGNOSIS — Y92008 Other place in unspecified non-institutional (private) residence as the place of occurrence of the external cause: Secondary | ICD-10-CM | POA: Diagnosis not present

## 2015-06-04 DIAGNOSIS — S93402A Sprain of unspecified ligament of left ankle, initial encounter: Secondary | ICD-10-CM

## 2015-06-04 DIAGNOSIS — Y999 Unspecified external cause status: Secondary | ICD-10-CM | POA: Diagnosis not present

## 2015-06-04 DIAGNOSIS — S99912A Unspecified injury of left ankle, initial encounter: Secondary | ICD-10-CM | POA: Diagnosis not present

## 2015-06-04 DIAGNOSIS — W1839XA Other fall on same level, initial encounter: Secondary | ICD-10-CM | POA: Diagnosis not present

## 2015-06-04 DIAGNOSIS — F17210 Nicotine dependence, cigarettes, uncomplicated: Secondary | ICD-10-CM | POA: Diagnosis not present

## 2015-06-04 DIAGNOSIS — M79672 Pain in left foot: Secondary | ICD-10-CM | POA: Diagnosis not present

## 2015-06-04 MED ORDER — ACETAMINOPHEN 325 MG PO TABS
650.0000 mg | ORAL_TABLET | Freq: Once | ORAL | Status: AC
  Administered 2015-06-04: 650 mg via ORAL
  Filled 2015-06-04: qty 2

## 2015-06-04 NOTE — ED Provider Notes (Signed)
CSN: [id number]     Arrival date & time 06/04/15  1333 History   First MD Initiated Contact with Patient 06/04/15 1601     Chief Complaint  Patient presents with  . Ankle Pain     (Consider location/radiation/quality/duration/timing/severity/associated sxs/prior Treatment) HPI 40 year old female from group home who fell this morning twisting her left ankle. She is complaining of pain left ankle and foot. There is a small laceration between her toes. She states that she hurt the foot but is unable to tell me specifically how the foot was turned.  She has had pain with ambulation since then. There are no other injuries noted by the patient or the people at the group home. Past Medical History  Diagnosis Date  . Lupus ([place])   . Renal disorder   . Dialysis patient ([facility])   . Anemia   . Vaginal odor 11/08/2013  . BV (bacterial vaginosis) 11/08/2013  . Hypertension   . DVT (deep venous thrombosis) [facility])    Past Surgical History  Procedure Laterality Date  . Av fistula placement    . Peripheral vascular catheterization Left 01/04/2015    Procedure: A/V Shuntogram/Fistulagram;  Surgeon: Algernon Huxley, MD;  Location: [facility];  Service: Cardiovascular;  Laterality: Left;  . Peripheral vascular catheterization N/A 01/04/2015    Procedure: A/V Shunt Intervention;  Surgeon: Algernon Huxley, MD;  Location: [facility];  Service: Cardiovascular;  Laterality: N/A;  . Peripheral vascular catheterization Left 04/13/2015    Procedure: A/V Shuntogram/Fistulagram;  Surgeon: Katha Cabal, MD;  Location: [facility];  Service: Cardiovascular;  Laterality: Left;  . Peripheral vascular catheterization Left 04/13/2015    Procedure: A/V Shunt Intervention;  Surgeon: Katha Cabal, MD;  Location: [facility];  Service: Cardiovascular;  Laterality: Left;   Family History  Problem Relation Age of Onset  . Seizures Son   . Kidney disease Mother     on dialysis  . Lupus  Mother    Social History  Substance Use Topics  . Smoking status: Current Every Day Smoker -- 1.00 packs/day for 15 years    Types: Cigarettes  . Smokeless tobacco: Never Used     Comment: smokes 2 cig daily  . Alcohol Use: No   OB History    Gravida Para Term Preterm AB TAB SAB Ectopic Multiple Living   3 3             Review of Systems  All other systems reviewed and are negative.     Allergies  Dust mite extract  Home Medications   Prior to Admission medications   Medication Sig Start Date End Date Taking? Authorizing Provider  albuterol (PROVENTIL) (2.5 MG/3ML) 0.083% nebulizer solution Take 2.5 mg by nebulization 4 (four) times daily as needed (breathing).    Historical Provider, MD  ALPRAZolam Duanne Moron) 0.25 MG tablet Take 0.25 mg by mouth 2 (two) times daily as needed for anxiety.     Historical Provider, MD  aspirin EC 81 MG tablet Take 81 mg by mouth daily.    Historical Provider, MD  chlorhexidine (PERIDEX) 0.12 % solution Use as directed 10 mLs in the mouth or throat 2 (two) times daily.    Historical Provider, MD  clopidogrel (PLAVIX) 75 MG tablet Take 75 mg by mouth daily.    Historical Provider, MD  docusate sodium (COLACE) 100 MG capsule Take 100 mg by mouth daily.    Historical Provider, MD  epoetin alfa (  EPOGEN,PROCRIT) 13086 UNIT/ML injection Inject 4,000 Units into the skin 3 (three) times a week. Mondays, Wednesdays, Fridays    Historical Provider, MD  guaiFENesin (ROBITUSSIN) 100 MG/5ML liquid Take 200 mg by mouth 3 (three) times daily as needed for cough.    Historical Provider, MD  HYDROcodone-acetaminophen (NORCO/VICODIN) 5-325 MG per tablet Take 2 tablets by mouth every 4 (four) hours as needed. 06/17/13   Tanna Furry, MD  hydroxychloroquine (PLAQUENIL) 200 MG tablet Take 200 mg by mouth daily.    Historical Provider, MD  ipratropium (ATROVENT) 0.02 % nebulizer solution Take 250 mcg by nebulization 4 (four) times daily as needed (breathing).    Historical  Provider, MD  lidocaine-prilocaine (EMLA) cream Apply 1 application topically as needed (for port access).  12/20/12   Historical Provider, MD  loratadine (CLARITIN) 10 MG tablet Take 10 mg by mouth daily as needed for allergies.    Historical Provider, MD  metroNIDAZOLE (FLAGYL) 500 MG tablet Take 1 tablet (500 mg total) by mouth 2 (two) times daily. 11/08/13   Estill Dooms, NP  midodrine (PROAMATINE) 10 MG tablet Take 10 mg by mouth 2 (two) times daily. Monday, Wednesday, and Friday before dialysis    Historical Provider, MD  mirtazapine (REMERON) 15 MG tablet 15 mg at bedtime.  11/03/13   Historical Provider, MD  multivitamin (RENA-VIT) TABS tablet Take 1 tablet by mouth daily.    Historical Provider, MD  ondansetron (ZOFRAN) 8 MG tablet Take by mouth as needed for nausea or vomiting.    Historical Provider, MD  pantoprazole (PROTONIX) 40 MG tablet Take 40 mg by mouth daily.    Historical Provider, MD  phenytoin (DILANTIN) 100 MG ER capsule Take 200 mg by mouth 2 (two) times daily.     Historical Provider, MD  polyethylene glycol (MIRALAX / GLYCOLAX) packet Take 17 g by mouth daily as needed for mild constipation.    Historical Provider, MD  sertraline (ZOLOFT) 100 MG tablet Take 200 mg by mouth 2 (two) times daily.     Historical Provider, MD  sevelamer carbonate (RENVELA) 800 MG tablet Take 800-1,600 mg by mouth 4 (four) times daily. Takes 1600 mg with each meal and 800 mg with snack    Historical Provider, MD  simvastatin (ZOCOR) 40 MG tablet Take 40 mg by mouth daily.    Historical Provider, MD   BP 117/65 mmHg  Pulse 83  Temp(Src) 98.1 F (36.7 C) (Oral)  Resp 14  Ht 5\' 3"  (1.6 m)  Wt 44.453 kg  BMI 17.36 kg/m2  SpO2 100%  LMP 04/29/2015 Physical Exam  Constitutional: She appears well-developed and well-nourished. No distress.  HENT:  Head: Normocephalic.  Neck: Normal range of motion.  Musculoskeletal: Normal range of motion. She exhibits tenderness. She exhibits no edema.         Feet:  Nursing note and vitals reviewed.   ED Course  Procedures (including critical care time) Labs Review Labs Reviewed - No data to display  Imaging Review Dg Ankle Complete Left  06/04/2015  CLINICAL DATA:  Left ankle pain and edema, fell in her room earlier this morning EXAM: LEFT ANKLE COMPLETE - 3+ VIEW COMPARISON:  None. FINDINGS: Three views of left ankle submitted. No acute fracture or subluxation. Mild osteopenia. Ankle mortise is preserved. IMPRESSION: No acute fracture or subluxation.  Mild osteopenia. Electronically Signed   By: Lahoma Crocker M.D.   On: 06/04/2015 14:45   I have personally reviewed and evaluated these images and lab results  as part of my medical decision-making.   EKG Interpretation None      MDM   Final diagnoses:  Left ankle sprain, initial encounter        Pattricia Boss, MD 06/04/15 1739

## 2015-06-04 NOTE — Discharge Instructions (Signed)
Ankle Sprain  An ankle sprain is an injury to the strong, fibrous tissues (ligaments) that hold the bones of your ankle joint together.   CAUSES  An ankle sprain is usually caused by a fall or by twisting your ankle. Ankle sprains most commonly occur when you step on the outer edge of your foot, and your ankle turns inward. People who participate in sports are more prone to these types of injuries.   SYMPTOMS    Pain in your ankle. The pain may be present at rest or only when you are trying to stand or walk.   Swelling.   Bruising. Bruising may develop immediately or within 1 to 2 days after your injury.   Difficulty standing or walking, particularly when turning corners or changing directions.  DIAGNOSIS   Your caregiver will ask you details about your injury and perform a physical exam of your ankle to determine if you have an ankle sprain. During the physical exam, your caregiver will press on and apply pressure to specific areas of your foot and ankle. Your caregiver will try to move your ankle in certain ways. An X-Priscella Donna exam may be done to be sure a bone was not broken or a ligament did not separate from one of the bones in your ankle (avulsion fracture).   TREATMENT   Certain types of braces can help stabilize your ankle. Your caregiver can make a recommendation for this. Your caregiver may recommend the use of medicine for pain. If your sprain is severe, your caregiver may refer you to a surgeon who helps to restore function to parts of your skeletal system (orthopedist) or a physical therapist.  HOME CARE INSTRUCTIONS    Apply ice to your injury for 1-2 days or as directed by your caregiver. Applying ice helps to reduce inflammation and pain.    Put ice in a plastic bag.    Place a towel between your skin and the bag.    Leave the ice on for 15-20 minutes at a time, every 2 hours while you are awake.   Only take over-the-counter or prescription medicines for pain, discomfort, or fever as directed by  your caregiver.   Elevate your injured ankle above the level of your heart as much as possible for 2-3 days.   If your caregiver recommends crutches, use them as instructed. Gradually put weight on the affected ankle. Continue to use crutches or a cane until you can walk without feeling pain in your ankle.   If you have a plaster splint, wear the splint as directed by your caregiver. Do not rest it on anything harder than a pillow for the first 24 hours. Do not put weight on it. Do not get it wet. You may take it off to take a shower or bath.   You may have been given an elastic bandage to wear around your ankle to provide support. If the elastic bandage is too tight (you have numbness or tingling in your foot or your foot becomes cold and blue), adjust the bandage to make it comfortable.   If you have an air splint, you may blow more air into it or let air out to make it more comfortable. You may take your splint off at night and before taking a shower or bath. Wiggle your toes in the splint several times per day to decrease swelling.  SEEK MEDICAL CARE IF:    You have rapidly increasing bruising or swelling.   Your toes feel   extremely cold or you lose feeling in your foot.   Your pain is not relieved with medicine.  SEEK IMMEDIATE MEDICAL CARE IF:   Your toes are numb or blue.   You have severe pain that is increasing.  MAKE SURE YOU:    Understand these instructions.   Will watch your condition.   Will get help right away if you are not doing well or get worse.     This information is not intended to replace advice given to you by your health care provider. Make sure you discuss any questions you have with your health care provider.     Document Released: 03/03/2005 Document Revised: 03/24/2014 Document Reviewed: 03/15/2011  Elsevier Interactive Patient Education 2016 [employer]

## 2015-06-04 NOTE — ED Notes (Signed)
Pt c/o LT ankle pain and edema. States she fell in her room earlier this morning.

## 2015-06-05 DIAGNOSIS — N186 End stage renal disease: Secondary | ICD-10-CM | POA: Diagnosis not present

## 2015-06-05 DIAGNOSIS — N2581 Secondary hyperparathyroidism of renal origin: Secondary | ICD-10-CM | POA: Diagnosis not present

## 2015-06-05 DIAGNOSIS — D631 Anemia in chronic kidney disease: Secondary | ICD-10-CM | POA: Diagnosis not present

## 2015-06-05 DIAGNOSIS — D509 Iron deficiency anemia, unspecified: Secondary | ICD-10-CM | POA: Diagnosis not present

## 2015-06-05 DIAGNOSIS — Z992 Dependence on renal dialysis: Secondary | ICD-10-CM | POA: Diagnosis not present

## 2015-06-06 DIAGNOSIS — D509 Iron deficiency anemia, unspecified: Secondary | ICD-10-CM | POA: Diagnosis not present

## 2015-06-06 DIAGNOSIS — N186 End stage renal disease: Secondary | ICD-10-CM | POA: Diagnosis not present

## 2015-06-06 DIAGNOSIS — D631 Anemia in chronic kidney disease: Secondary | ICD-10-CM | POA: Diagnosis not present

## 2015-06-06 DIAGNOSIS — N2581 Secondary hyperparathyroidism of renal origin: Secondary | ICD-10-CM | POA: Diagnosis not present

## 2015-06-06 DIAGNOSIS — Z992 Dependence on renal dialysis: Secondary | ICD-10-CM | POA: Diagnosis not present

## 2015-06-08 DIAGNOSIS — D509 Iron deficiency anemia, unspecified: Secondary | ICD-10-CM | POA: Diagnosis not present

## 2015-06-08 DIAGNOSIS — Z992 Dependence on renal dialysis: Secondary | ICD-10-CM | POA: Diagnosis not present

## 2015-06-08 DIAGNOSIS — N186 End stage renal disease: Secondary | ICD-10-CM | POA: Diagnosis not present

## 2015-06-08 DIAGNOSIS — D631 Anemia in chronic kidney disease: Secondary | ICD-10-CM | POA: Diagnosis not present

## 2015-06-08 DIAGNOSIS — N2581 Secondary hyperparathyroidism of renal origin: Secondary | ICD-10-CM | POA: Diagnosis not present

## 2015-06-11 DIAGNOSIS — N186 End stage renal disease: Secondary | ICD-10-CM | POA: Diagnosis not present

## 2015-06-11 DIAGNOSIS — N2581 Secondary hyperparathyroidism of renal origin: Secondary | ICD-10-CM | POA: Diagnosis not present

## 2015-06-11 DIAGNOSIS — Z992 Dependence on renal dialysis: Secondary | ICD-10-CM | POA: Diagnosis not present

## 2015-06-11 DIAGNOSIS — D509 Iron deficiency anemia, unspecified: Secondary | ICD-10-CM | POA: Diagnosis not present

## 2015-06-11 DIAGNOSIS — D631 Anemia in chronic kidney disease: Secondary | ICD-10-CM | POA: Diagnosis not present

## 2015-06-13 DIAGNOSIS — Z992 Dependence on renal dialysis: Secondary | ICD-10-CM | POA: Diagnosis not present

## 2015-06-13 DIAGNOSIS — N2581 Secondary hyperparathyroidism of renal origin: Secondary | ICD-10-CM | POA: Diagnosis not present

## 2015-06-13 DIAGNOSIS — N186 End stage renal disease: Secondary | ICD-10-CM | POA: Diagnosis not present

## 2015-06-13 DIAGNOSIS — D631 Anemia in chronic kidney disease: Secondary | ICD-10-CM | POA: Diagnosis not present

## 2015-06-13 DIAGNOSIS — D509 Iron deficiency anemia, unspecified: Secondary | ICD-10-CM | POA: Diagnosis not present

## 2015-06-15 DIAGNOSIS — D631 Anemia in chronic kidney disease: Secondary | ICD-10-CM | POA: Diagnosis not present

## 2015-06-15 DIAGNOSIS — Z992 Dependence on renal dialysis: Secondary | ICD-10-CM | POA: Diagnosis not present

## 2015-06-15 DIAGNOSIS — N2581 Secondary hyperparathyroidism of renal origin: Secondary | ICD-10-CM | POA: Diagnosis not present

## 2015-06-15 DIAGNOSIS — N186 End stage renal disease: Secondary | ICD-10-CM | POA: Diagnosis not present

## 2015-06-15 DIAGNOSIS — D509 Iron deficiency anemia, unspecified: Secondary | ICD-10-CM | POA: Diagnosis not present

## 2015-06-18 DIAGNOSIS — Z992 Dependence on renal dialysis: Secondary | ICD-10-CM | POA: Diagnosis not present

## 2015-06-18 DIAGNOSIS — N2581 Secondary hyperparathyroidism of renal origin: Secondary | ICD-10-CM | POA: Diagnosis not present

## 2015-06-18 DIAGNOSIS — D631 Anemia in chronic kidney disease: Secondary | ICD-10-CM | POA: Diagnosis not present

## 2015-06-18 DIAGNOSIS — D509 Iron deficiency anemia, unspecified: Secondary | ICD-10-CM | POA: Diagnosis not present

## 2015-06-18 DIAGNOSIS — N186 End stage renal disease: Secondary | ICD-10-CM | POA: Diagnosis not present

## 2015-06-19 DIAGNOSIS — N186 End stage renal disease: Secondary | ICD-10-CM | POA: Diagnosis not present

## 2015-06-19 DIAGNOSIS — T82858A Stenosis of vascular prosthetic devices, implants and grafts, initial encounter: Secondary | ICD-10-CM | POA: Diagnosis not present

## 2015-06-19 DIAGNOSIS — Y841 Kidney dialysis as the cause of abnormal reaction of the patient, or of later complication, without mention of misadventure at the time of the procedure: Secondary | ICD-10-CM | POA: Diagnosis not present

## 2015-06-19 DIAGNOSIS — Z992 Dependence on renal dialysis: Secondary | ICD-10-CM | POA: Diagnosis not present

## 2015-06-19 DIAGNOSIS — T82318A Breakdown (mechanical) of other vascular grafts, initial encounter: Secondary | ICD-10-CM | POA: Diagnosis not present

## 2015-06-19 DIAGNOSIS — I10 Essential (primary) hypertension: Secondary | ICD-10-CM | POA: Diagnosis not present

## 2015-06-20 DIAGNOSIS — N186 End stage renal disease: Secondary | ICD-10-CM | POA: Diagnosis not present

## 2015-06-20 DIAGNOSIS — Z992 Dependence on renal dialysis: Secondary | ICD-10-CM | POA: Diagnosis not present

## 2015-06-20 DIAGNOSIS — D631 Anemia in chronic kidney disease: Secondary | ICD-10-CM | POA: Diagnosis not present

## 2015-06-20 DIAGNOSIS — D509 Iron deficiency anemia, unspecified: Secondary | ICD-10-CM | POA: Diagnosis not present

## 2015-06-20 DIAGNOSIS — N2581 Secondary hyperparathyroidism of renal origin: Secondary | ICD-10-CM | POA: Diagnosis not present

## 2015-06-22 DIAGNOSIS — Z992 Dependence on renal dialysis: Secondary | ICD-10-CM | POA: Diagnosis not present

## 2015-06-22 DIAGNOSIS — D631 Anemia in chronic kidney disease: Secondary | ICD-10-CM | POA: Diagnosis not present

## 2015-06-22 DIAGNOSIS — D509 Iron deficiency anemia, unspecified: Secondary | ICD-10-CM | POA: Diagnosis not present

## 2015-06-22 DIAGNOSIS — N186 End stage renal disease: Secondary | ICD-10-CM | POA: Diagnosis not present

## 2015-06-22 DIAGNOSIS — N2581 Secondary hyperparathyroidism of renal origin: Secondary | ICD-10-CM | POA: Diagnosis not present

## 2015-06-24 IMAGING — XA IR VASCULAR PROCEDURE
11 series · 15 of 15 positions shown · IV contrast (IODINE)
Comparison: none

[Series 1: care upper arm · 2 of 2 slices shown (1 of 6)]
[im 1/2]
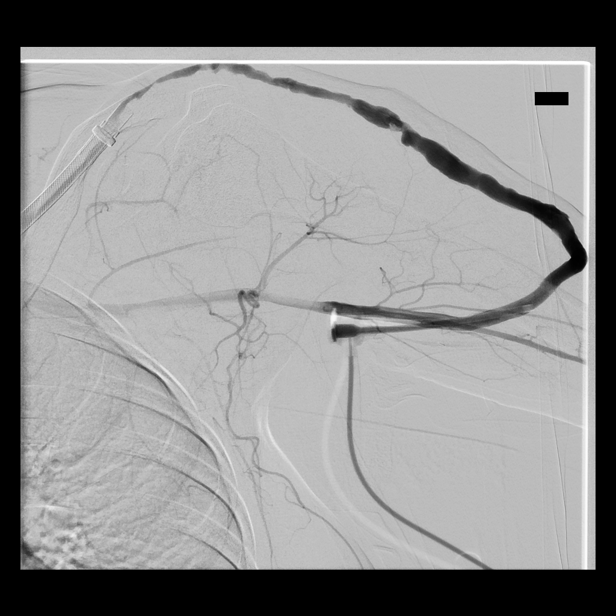
[im 2/2]
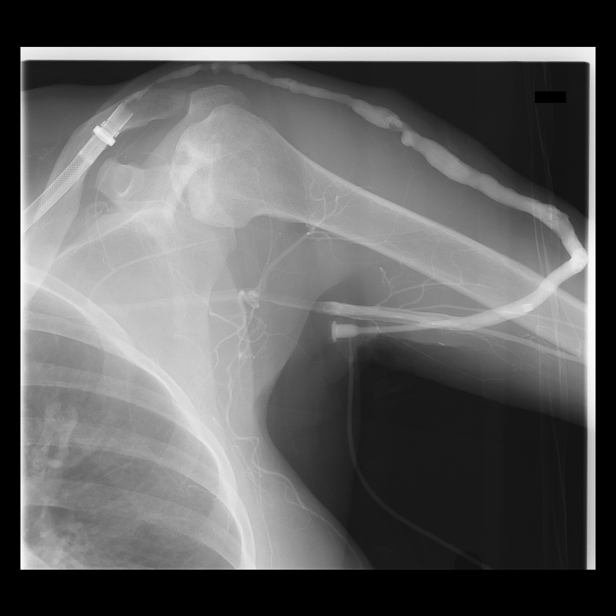

[Series 2: fl - angio · 1 of 1 slices shown (1 of 5)]
[im 1/1]
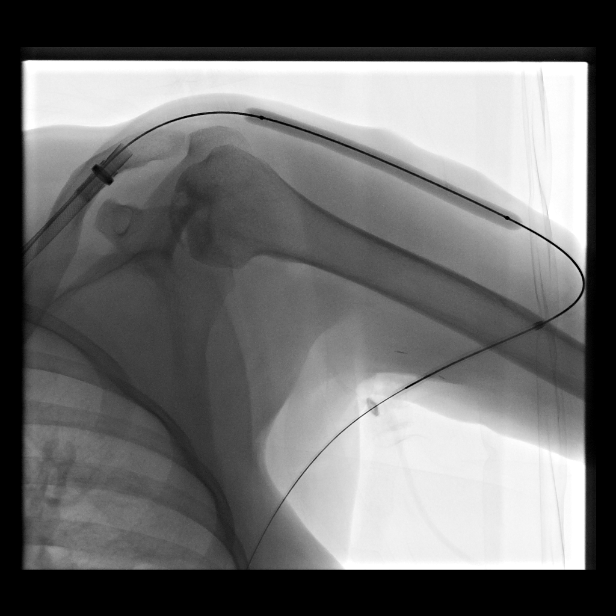

[Series 3: fl - angio · 1 of 1 slices shown (2 of 5)]
[im 1/1]
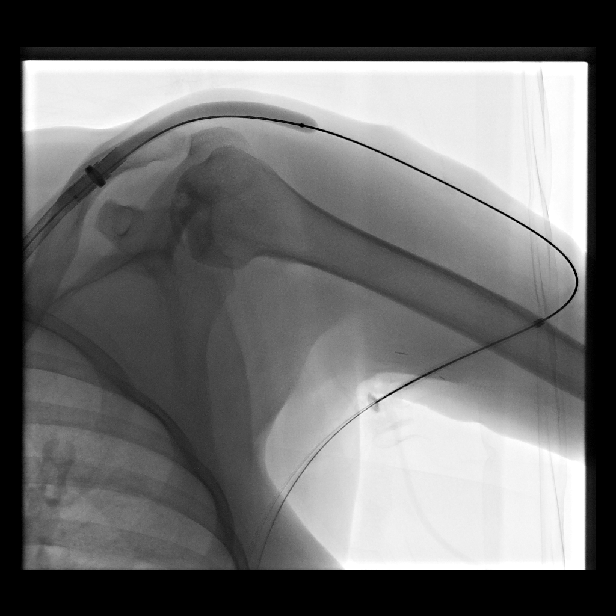

[Series 4: care upper arm · 2 of 2 slices shown (2 of 6)]
[im 1/2]
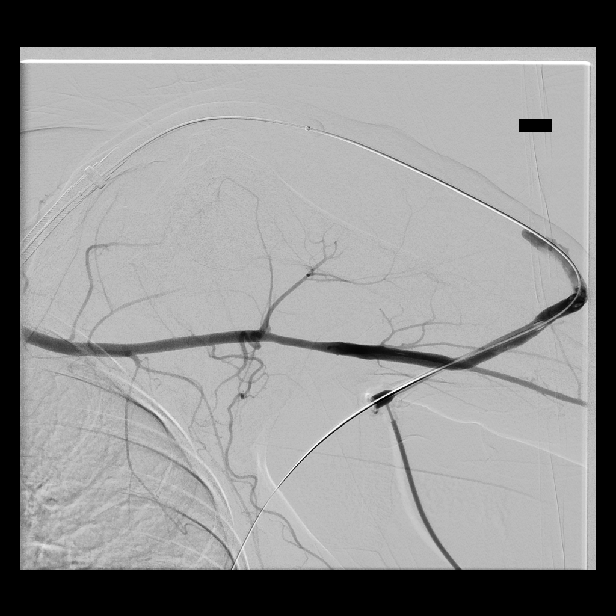
[im 2/2]
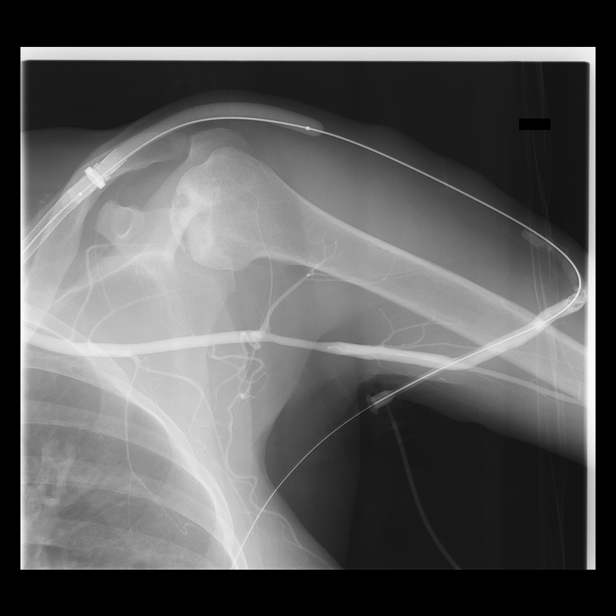

[Series 5: fl - angio · 1 of 1 slices shown (3 of 5)]
[im 1/1]
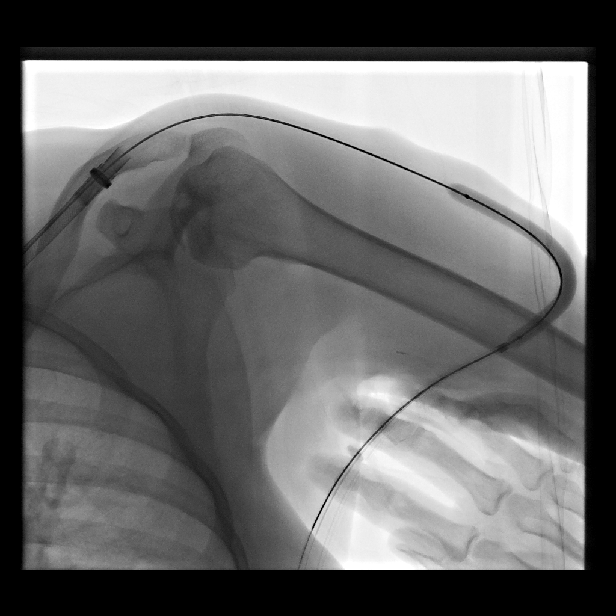

[Series 6: care upper arm · 2 of 2 slices shown (3 of 6)]
[im 1/2]
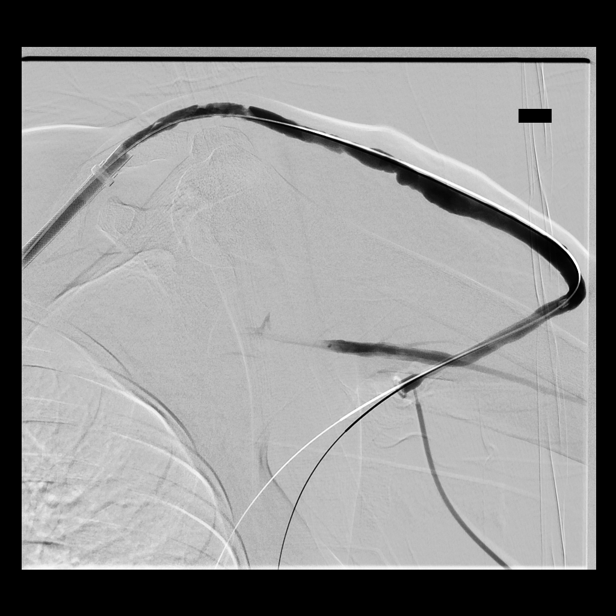
[im 2/2]
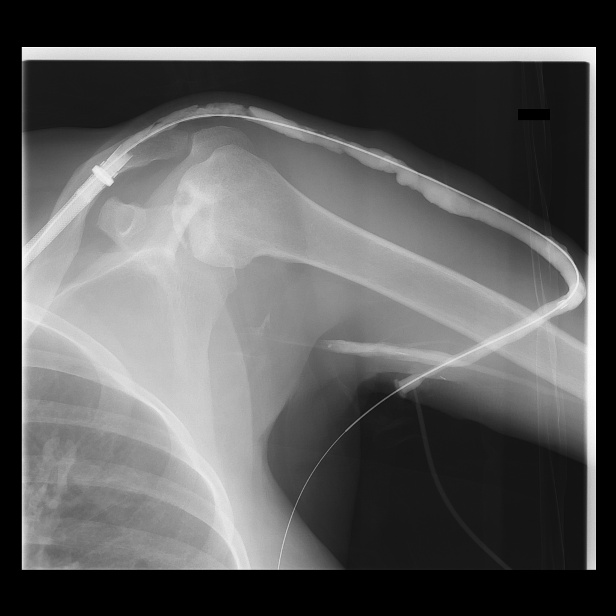

[Series 7: fl - angio · 1 of 1 slices shown (4 of 5)]
[im 1/1]
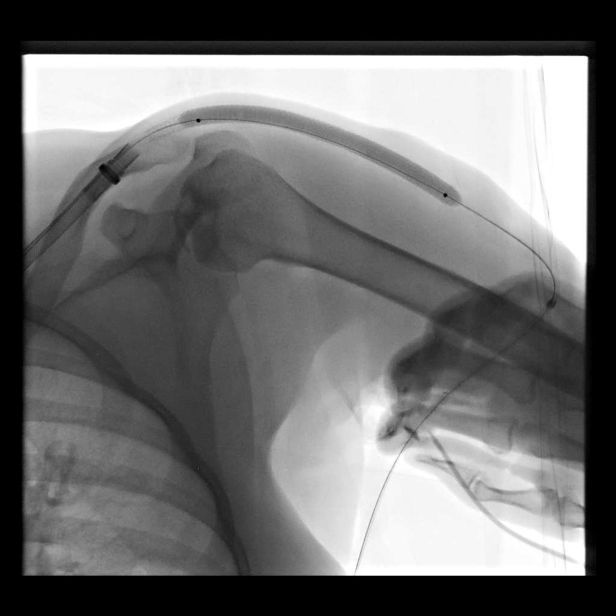

[Series 8: fl - angio · 1 of 1 slices shown (5 of 5)]
[im 1/1]
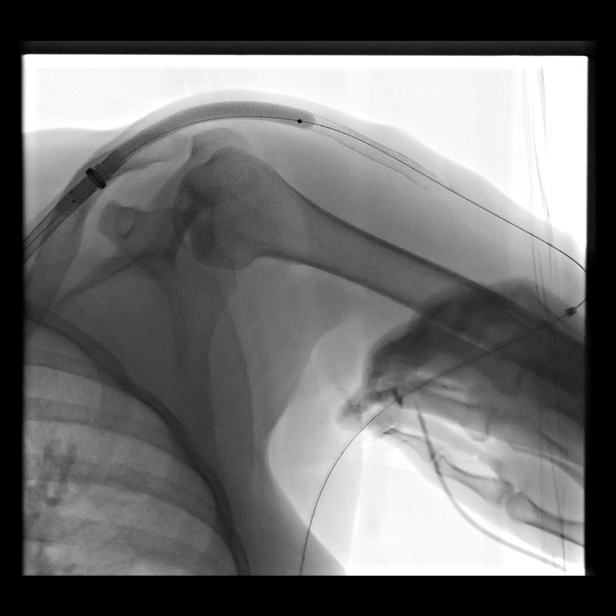

[Series 9: care upper arm · 1 of 1 slices shown (4 of 6)]
[im 1/1]
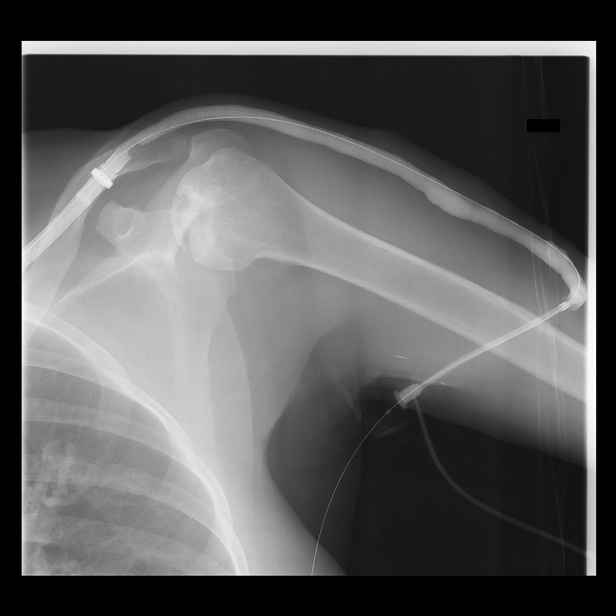

[Series 10: care upper arm · 2 of 2 slices shown (5 of 6)]
[im 1/2]
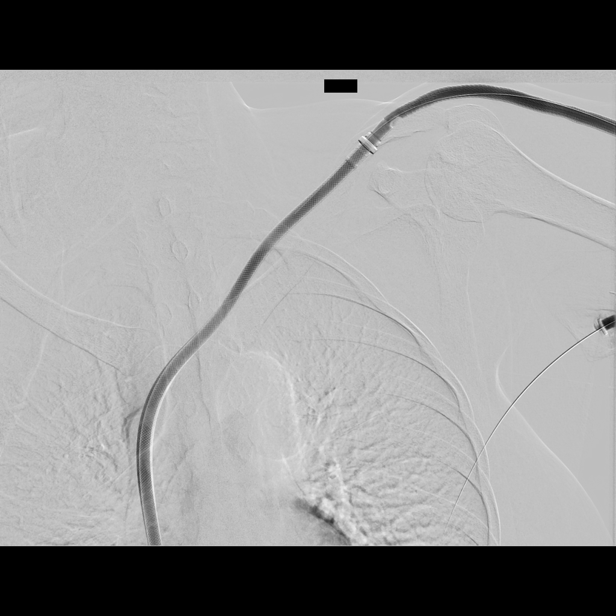
[im 2/2]
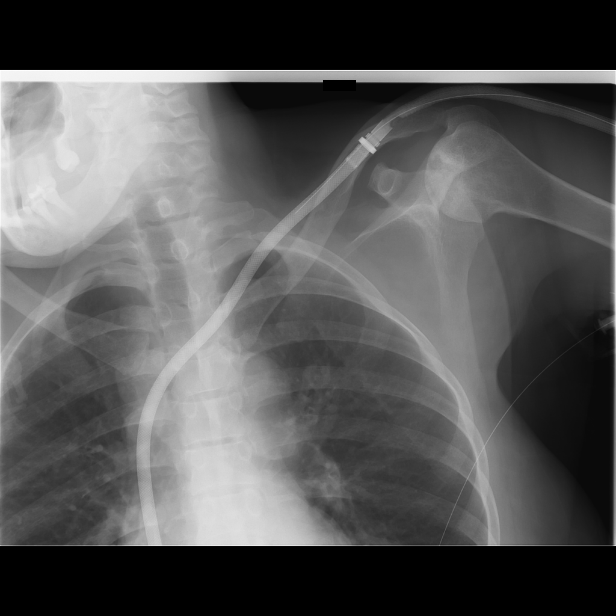

[Series 11: care upper arm · 1 of 1 slices shown (6 of 6)]
[im 1/1]
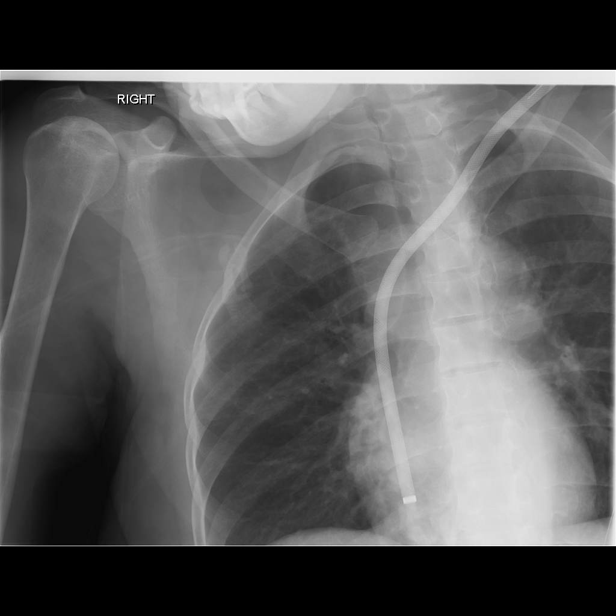

[15 of 15 positions shown; findings below may reference images not displayed]

IMAGES IMPORTED FROM THE [employer] WORKFLOW SYSTEM
NO DICTATION FOR STUDY

## 2015-06-25 DIAGNOSIS — N2581 Secondary hyperparathyroidism of renal origin: Secondary | ICD-10-CM | POA: Diagnosis not present

## 2015-06-25 DIAGNOSIS — E119 Type 2 diabetes mellitus without complications: Secondary | ICD-10-CM | POA: Diagnosis not present

## 2015-06-25 DIAGNOSIS — D631 Anemia in chronic kidney disease: Secondary | ICD-10-CM | POA: Diagnosis not present

## 2015-06-25 DIAGNOSIS — D509 Iron deficiency anemia, unspecified: Secondary | ICD-10-CM | POA: Diagnosis not present

## 2015-06-25 DIAGNOSIS — Z992 Dependence on renal dialysis: Secondary | ICD-10-CM | POA: Diagnosis not present

## 2015-06-25 DIAGNOSIS — N186 End stage renal disease: Secondary | ICD-10-CM | POA: Diagnosis not present

## 2015-06-27 DIAGNOSIS — Z992 Dependence on renal dialysis: Secondary | ICD-10-CM | POA: Diagnosis not present

## 2015-06-27 DIAGNOSIS — D509 Iron deficiency anemia, unspecified: Secondary | ICD-10-CM | POA: Diagnosis not present

## 2015-06-27 DIAGNOSIS — N186 End stage renal disease: Secondary | ICD-10-CM | POA: Diagnosis not present

## 2015-06-27 DIAGNOSIS — N2581 Secondary hyperparathyroidism of renal origin: Secondary | ICD-10-CM | POA: Diagnosis not present

## 2015-06-27 DIAGNOSIS — D631 Anemia in chronic kidney disease: Secondary | ICD-10-CM | POA: Diagnosis not present

## 2015-06-29 DIAGNOSIS — N2581 Secondary hyperparathyroidism of renal origin: Secondary | ICD-10-CM | POA: Diagnosis not present

## 2015-06-29 DIAGNOSIS — D631 Anemia in chronic kidney disease: Secondary | ICD-10-CM | POA: Diagnosis not present

## 2015-06-29 DIAGNOSIS — N186 End stage renal disease: Secondary | ICD-10-CM | POA: Diagnosis not present

## 2015-06-29 DIAGNOSIS — Z992 Dependence on renal dialysis: Secondary | ICD-10-CM | POA: Diagnosis not present

## 2015-06-29 DIAGNOSIS — D509 Iron deficiency anemia, unspecified: Secondary | ICD-10-CM | POA: Diagnosis not present

## 2015-07-02 DIAGNOSIS — D631 Anemia in chronic kidney disease: Secondary | ICD-10-CM | POA: Diagnosis not present

## 2015-07-02 DIAGNOSIS — N2581 Secondary hyperparathyroidism of renal origin: Secondary | ICD-10-CM | POA: Diagnosis not present

## 2015-07-02 DIAGNOSIS — Z992 Dependence on renal dialysis: Secondary | ICD-10-CM | POA: Diagnosis not present

## 2015-07-02 DIAGNOSIS — D509 Iron deficiency anemia, unspecified: Secondary | ICD-10-CM | POA: Diagnosis not present

## 2015-07-02 DIAGNOSIS — N186 End stage renal disease: Secondary | ICD-10-CM | POA: Diagnosis not present

## 2015-07-04 DIAGNOSIS — Z992 Dependence on renal dialysis: Secondary | ICD-10-CM | POA: Diagnosis not present

## 2015-07-04 DIAGNOSIS — N186 End stage renal disease: Secondary | ICD-10-CM | POA: Diagnosis not present

## 2015-07-04 DIAGNOSIS — N2581 Secondary hyperparathyroidism of renal origin: Secondary | ICD-10-CM | POA: Diagnosis not present

## 2015-07-04 DIAGNOSIS — D509 Iron deficiency anemia, unspecified: Secondary | ICD-10-CM | POA: Diagnosis not present

## 2015-07-04 DIAGNOSIS — D631 Anemia in chronic kidney disease: Secondary | ICD-10-CM | POA: Diagnosis not present

## 2015-07-06 DIAGNOSIS — N2581 Secondary hyperparathyroidism of renal origin: Secondary | ICD-10-CM | POA: Diagnosis not present

## 2015-07-06 DIAGNOSIS — D631 Anemia in chronic kidney disease: Secondary | ICD-10-CM | POA: Diagnosis not present

## 2015-07-06 DIAGNOSIS — N186 End stage renal disease: Secondary | ICD-10-CM | POA: Diagnosis not present

## 2015-07-06 DIAGNOSIS — D509 Iron deficiency anemia, unspecified: Secondary | ICD-10-CM | POA: Diagnosis not present

## 2015-07-06 DIAGNOSIS — Z992 Dependence on renal dialysis: Secondary | ICD-10-CM | POA: Diagnosis not present

## 2015-07-09 DIAGNOSIS — N2581 Secondary hyperparathyroidism of renal origin: Secondary | ICD-10-CM | POA: Diagnosis not present

## 2015-07-09 DIAGNOSIS — D631 Anemia in chronic kidney disease: Secondary | ICD-10-CM | POA: Diagnosis not present

## 2015-07-09 DIAGNOSIS — N186 End stage renal disease: Secondary | ICD-10-CM | POA: Diagnosis not present

## 2015-07-09 DIAGNOSIS — Z992 Dependence on renal dialysis: Secondary | ICD-10-CM | POA: Diagnosis not present

## 2015-07-09 DIAGNOSIS — D509 Iron deficiency anemia, unspecified: Secondary | ICD-10-CM | POA: Diagnosis not present

## 2015-07-11 DIAGNOSIS — D509 Iron deficiency anemia, unspecified: Secondary | ICD-10-CM | POA: Diagnosis not present

## 2015-07-11 DIAGNOSIS — N2581 Secondary hyperparathyroidism of renal origin: Secondary | ICD-10-CM | POA: Diagnosis not present

## 2015-07-11 DIAGNOSIS — D631 Anemia in chronic kidney disease: Secondary | ICD-10-CM | POA: Diagnosis not present

## 2015-07-11 DIAGNOSIS — N186 End stage renal disease: Secondary | ICD-10-CM | POA: Diagnosis not present

## 2015-07-11 DIAGNOSIS — Z992 Dependence on renal dialysis: Secondary | ICD-10-CM | POA: Diagnosis not present

## 2015-07-13 DIAGNOSIS — Z992 Dependence on renal dialysis: Secondary | ICD-10-CM | POA: Diagnosis not present

## 2015-07-13 DIAGNOSIS — N2581 Secondary hyperparathyroidism of renal origin: Secondary | ICD-10-CM | POA: Diagnosis not present

## 2015-07-13 DIAGNOSIS — N186 End stage renal disease: Secondary | ICD-10-CM | POA: Diagnosis not present

## 2015-07-13 DIAGNOSIS — D509 Iron deficiency anemia, unspecified: Secondary | ICD-10-CM | POA: Diagnosis not present

## 2015-07-13 DIAGNOSIS — D631 Anemia in chronic kidney disease: Secondary | ICD-10-CM | POA: Diagnosis not present

## 2015-07-15 DIAGNOSIS — N186 End stage renal disease: Secondary | ICD-10-CM | POA: Diagnosis not present

## 2015-07-15 DIAGNOSIS — Z992 Dependence on renal dialysis: Secondary | ICD-10-CM | POA: Diagnosis not present

## 2015-07-16 DIAGNOSIS — N186 End stage renal disease: Secondary | ICD-10-CM | POA: Diagnosis not present

## 2015-07-16 DIAGNOSIS — D631 Anemia in chronic kidney disease: Secondary | ICD-10-CM | POA: Diagnosis not present

## 2015-07-16 DIAGNOSIS — D509 Iron deficiency anemia, unspecified: Secondary | ICD-10-CM | POA: Diagnosis not present

## 2015-07-16 DIAGNOSIS — Z992 Dependence on renal dialysis: Secondary | ICD-10-CM | POA: Diagnosis not present

## 2015-07-16 DIAGNOSIS — N2581 Secondary hyperparathyroidism of renal origin: Secondary | ICD-10-CM | POA: Diagnosis not present

## 2015-07-18 DIAGNOSIS — D631 Anemia in chronic kidney disease: Secondary | ICD-10-CM | POA: Diagnosis not present

## 2015-07-18 DIAGNOSIS — Z992 Dependence on renal dialysis: Secondary | ICD-10-CM | POA: Diagnosis not present

## 2015-07-18 DIAGNOSIS — D509 Iron deficiency anemia, unspecified: Secondary | ICD-10-CM | POA: Diagnosis not present

## 2015-07-18 DIAGNOSIS — N186 End stage renal disease: Secondary | ICD-10-CM | POA: Diagnosis not present

## 2015-07-18 DIAGNOSIS — N2581 Secondary hyperparathyroidism of renal origin: Secondary | ICD-10-CM | POA: Diagnosis not present

## 2015-07-20 DIAGNOSIS — N186 End stage renal disease: Secondary | ICD-10-CM | POA: Diagnosis not present

## 2015-07-20 DIAGNOSIS — D509 Iron deficiency anemia, unspecified: Secondary | ICD-10-CM | POA: Diagnosis not present

## 2015-07-20 DIAGNOSIS — Z992 Dependence on renal dialysis: Secondary | ICD-10-CM | POA: Diagnosis not present

## 2015-07-20 DIAGNOSIS — D631 Anemia in chronic kidney disease: Secondary | ICD-10-CM | POA: Diagnosis not present

## 2015-07-20 DIAGNOSIS — N2581 Secondary hyperparathyroidism of renal origin: Secondary | ICD-10-CM | POA: Diagnosis not present

## 2015-07-23 DIAGNOSIS — Z992 Dependence on renal dialysis: Secondary | ICD-10-CM | POA: Diagnosis not present

## 2015-07-23 DIAGNOSIS — N2581 Secondary hyperparathyroidism of renal origin: Secondary | ICD-10-CM | POA: Diagnosis not present

## 2015-07-23 DIAGNOSIS — D631 Anemia in chronic kidney disease: Secondary | ICD-10-CM | POA: Diagnosis not present

## 2015-07-23 DIAGNOSIS — D509 Iron deficiency anemia, unspecified: Secondary | ICD-10-CM | POA: Diagnosis not present

## 2015-07-23 DIAGNOSIS — N186 End stage renal disease: Secondary | ICD-10-CM | POA: Diagnosis not present

## 2015-07-25 DIAGNOSIS — D631 Anemia in chronic kidney disease: Secondary | ICD-10-CM | POA: Diagnosis not present

## 2015-07-25 DIAGNOSIS — N186 End stage renal disease: Secondary | ICD-10-CM | POA: Diagnosis not present

## 2015-07-25 DIAGNOSIS — Z992 Dependence on renal dialysis: Secondary | ICD-10-CM | POA: Diagnosis not present

## 2015-07-25 DIAGNOSIS — N2581 Secondary hyperparathyroidism of renal origin: Secondary | ICD-10-CM | POA: Diagnosis not present

## 2015-07-25 DIAGNOSIS — D509 Iron deficiency anemia, unspecified: Secondary | ICD-10-CM | POA: Diagnosis not present

## 2015-07-27 DIAGNOSIS — N186 End stage renal disease: Secondary | ICD-10-CM | POA: Diagnosis not present

## 2015-07-27 DIAGNOSIS — N2581 Secondary hyperparathyroidism of renal origin: Secondary | ICD-10-CM | POA: Diagnosis not present

## 2015-07-27 DIAGNOSIS — D509 Iron deficiency anemia, unspecified: Secondary | ICD-10-CM | POA: Diagnosis not present

## 2015-07-27 DIAGNOSIS — Z992 Dependence on renal dialysis: Secondary | ICD-10-CM | POA: Diagnosis not present

## 2015-07-27 DIAGNOSIS — D631 Anemia in chronic kidney disease: Secondary | ICD-10-CM | POA: Diagnosis not present

## 2015-07-30 DIAGNOSIS — N186 End stage renal disease: Secondary | ICD-10-CM | POA: Diagnosis not present

## 2015-07-30 DIAGNOSIS — D509 Iron deficiency anemia, unspecified: Secondary | ICD-10-CM | POA: Diagnosis not present

## 2015-07-30 DIAGNOSIS — D631 Anemia in chronic kidney disease: Secondary | ICD-10-CM | POA: Diagnosis not present

## 2015-07-30 DIAGNOSIS — N2581 Secondary hyperparathyroidism of renal origin: Secondary | ICD-10-CM | POA: Diagnosis not present

## 2015-07-30 DIAGNOSIS — Z992 Dependence on renal dialysis: Secondary | ICD-10-CM | POA: Diagnosis not present

## 2015-07-31 DIAGNOSIS — R569 Unspecified convulsions: Secondary | ICD-10-CM | POA: Diagnosis not present

## 2015-07-31 DIAGNOSIS — I120 Hypertensive chronic kidney disease with stage 5 chronic kidney disease or end stage renal disease: Secondary | ICD-10-CM | POA: Diagnosis not present

## 2015-07-31 DIAGNOSIS — J449 Chronic obstructive pulmonary disease, unspecified: Secondary | ICD-10-CM | POA: Diagnosis not present

## 2015-07-31 DIAGNOSIS — M329 Systemic lupus erythematosus, unspecified: Secondary | ICD-10-CM | POA: Diagnosis not present

## 2015-08-01 DIAGNOSIS — N2581 Secondary hyperparathyroidism of renal origin: Secondary | ICD-10-CM | POA: Diagnosis not present

## 2015-08-01 DIAGNOSIS — D631 Anemia in chronic kidney disease: Secondary | ICD-10-CM | POA: Diagnosis not present

## 2015-08-01 DIAGNOSIS — Z992 Dependence on renal dialysis: Secondary | ICD-10-CM | POA: Diagnosis not present

## 2015-08-01 DIAGNOSIS — N186 End stage renal disease: Secondary | ICD-10-CM | POA: Diagnosis not present

## 2015-08-01 DIAGNOSIS — D509 Iron deficiency anemia, unspecified: Secondary | ICD-10-CM | POA: Diagnosis not present

## 2015-08-03 DIAGNOSIS — D631 Anemia in chronic kidney disease: Secondary | ICD-10-CM | POA: Diagnosis not present

## 2015-08-03 DIAGNOSIS — Z992 Dependence on renal dialysis: Secondary | ICD-10-CM | POA: Diagnosis not present

## 2015-08-03 DIAGNOSIS — N2581 Secondary hyperparathyroidism of renal origin: Secondary | ICD-10-CM | POA: Diagnosis not present

## 2015-08-03 DIAGNOSIS — N186 End stage renal disease: Secondary | ICD-10-CM | POA: Diagnosis not present

## 2015-08-03 DIAGNOSIS — D509 Iron deficiency anemia, unspecified: Secondary | ICD-10-CM | POA: Diagnosis not present

## 2015-08-06 DIAGNOSIS — D509 Iron deficiency anemia, unspecified: Secondary | ICD-10-CM | POA: Diagnosis not present

## 2015-08-06 DIAGNOSIS — N186 End stage renal disease: Secondary | ICD-10-CM | POA: Diagnosis not present

## 2015-08-06 DIAGNOSIS — Z992 Dependence on renal dialysis: Secondary | ICD-10-CM | POA: Diagnosis not present

## 2015-08-06 DIAGNOSIS — N2581 Secondary hyperparathyroidism of renal origin: Secondary | ICD-10-CM | POA: Diagnosis not present

## 2015-08-06 DIAGNOSIS — D631 Anemia in chronic kidney disease: Secondary | ICD-10-CM | POA: Diagnosis not present

## 2015-08-08 DIAGNOSIS — D509 Iron deficiency anemia, unspecified: Secondary | ICD-10-CM | POA: Diagnosis not present

## 2015-08-08 DIAGNOSIS — D631 Anemia in chronic kidney disease: Secondary | ICD-10-CM | POA: Diagnosis not present

## 2015-08-08 DIAGNOSIS — N186 End stage renal disease: Secondary | ICD-10-CM | POA: Diagnosis not present

## 2015-08-08 DIAGNOSIS — N2581 Secondary hyperparathyroidism of renal origin: Secondary | ICD-10-CM | POA: Diagnosis not present

## 2015-08-08 DIAGNOSIS — Z992 Dependence on renal dialysis: Secondary | ICD-10-CM | POA: Diagnosis not present

## 2015-08-10 DIAGNOSIS — D509 Iron deficiency anemia, unspecified: Secondary | ICD-10-CM | POA: Diagnosis not present

## 2015-08-10 DIAGNOSIS — N186 End stage renal disease: Secondary | ICD-10-CM | POA: Diagnosis not present

## 2015-08-10 DIAGNOSIS — D631 Anemia in chronic kidney disease: Secondary | ICD-10-CM | POA: Diagnosis not present

## 2015-08-10 DIAGNOSIS — N2581 Secondary hyperparathyroidism of renal origin: Secondary | ICD-10-CM | POA: Diagnosis not present

## 2015-08-10 DIAGNOSIS — Z992 Dependence on renal dialysis: Secondary | ICD-10-CM | POA: Diagnosis not present

## 2015-08-13 DIAGNOSIS — Z992 Dependence on renal dialysis: Secondary | ICD-10-CM | POA: Diagnosis not present

## 2015-08-13 DIAGNOSIS — N186 End stage renal disease: Secondary | ICD-10-CM | POA: Diagnosis not present

## 2015-08-13 DIAGNOSIS — N2581 Secondary hyperparathyroidism of renal origin: Secondary | ICD-10-CM | POA: Diagnosis not present

## 2015-08-13 DIAGNOSIS — D509 Iron deficiency anemia, unspecified: Secondary | ICD-10-CM | POA: Diagnosis not present

## 2015-08-13 DIAGNOSIS — D631 Anemia in chronic kidney disease: Secondary | ICD-10-CM | POA: Diagnosis not present

## 2015-08-15 DIAGNOSIS — N186 End stage renal disease: Secondary | ICD-10-CM | POA: Diagnosis not present

## 2015-08-15 DIAGNOSIS — Z992 Dependence on renal dialysis: Secondary | ICD-10-CM | POA: Diagnosis not present

## 2015-08-15 DIAGNOSIS — N2581 Secondary hyperparathyroidism of renal origin: Secondary | ICD-10-CM | POA: Diagnosis not present

## 2015-08-15 DIAGNOSIS — D509 Iron deficiency anemia, unspecified: Secondary | ICD-10-CM | POA: Diagnosis not present

## 2015-08-15 DIAGNOSIS — D631 Anemia in chronic kidney disease: Secondary | ICD-10-CM | POA: Diagnosis not present

## 2015-08-17 DIAGNOSIS — N186 End stage renal disease: Secondary | ICD-10-CM | POA: Diagnosis not present

## 2015-08-17 DIAGNOSIS — Z992 Dependence on renal dialysis: Secondary | ICD-10-CM | POA: Diagnosis not present

## 2015-08-17 DIAGNOSIS — N2581 Secondary hyperparathyroidism of renal origin: Secondary | ICD-10-CM | POA: Diagnosis not present

## 2015-08-17 DIAGNOSIS — D509 Iron deficiency anemia, unspecified: Secondary | ICD-10-CM | POA: Diagnosis not present

## 2015-08-17 DIAGNOSIS — D631 Anemia in chronic kidney disease: Secondary | ICD-10-CM | POA: Diagnosis not present

## 2015-08-20 DIAGNOSIS — Z992 Dependence on renal dialysis: Secondary | ICD-10-CM | POA: Diagnosis not present

## 2015-08-20 DIAGNOSIS — N186 End stage renal disease: Secondary | ICD-10-CM | POA: Diagnosis not present

## 2015-08-20 DIAGNOSIS — D631 Anemia in chronic kidney disease: Secondary | ICD-10-CM | POA: Diagnosis not present

## 2015-08-20 DIAGNOSIS — D509 Iron deficiency anemia, unspecified: Secondary | ICD-10-CM | POA: Diagnosis not present

## 2015-08-20 DIAGNOSIS — N2581 Secondary hyperparathyroidism of renal origin: Secondary | ICD-10-CM | POA: Diagnosis not present

## 2015-08-22 DIAGNOSIS — Z992 Dependence on renal dialysis: Secondary | ICD-10-CM | POA: Diagnosis not present

## 2015-08-22 DIAGNOSIS — N186 End stage renal disease: Secondary | ICD-10-CM | POA: Diagnosis not present

## 2015-08-22 DIAGNOSIS — D509 Iron deficiency anemia, unspecified: Secondary | ICD-10-CM | POA: Diagnosis not present

## 2015-08-22 DIAGNOSIS — D631 Anemia in chronic kidney disease: Secondary | ICD-10-CM | POA: Diagnosis not present

## 2015-08-22 DIAGNOSIS — N2581 Secondary hyperparathyroidism of renal origin: Secondary | ICD-10-CM | POA: Diagnosis not present

## 2015-08-24 DIAGNOSIS — D631 Anemia in chronic kidney disease: Secondary | ICD-10-CM | POA: Diagnosis not present

## 2015-08-24 DIAGNOSIS — N2581 Secondary hyperparathyroidism of renal origin: Secondary | ICD-10-CM | POA: Diagnosis not present

## 2015-08-24 DIAGNOSIS — Z992 Dependence on renal dialysis: Secondary | ICD-10-CM | POA: Diagnosis not present

## 2015-08-24 DIAGNOSIS — N186 End stage renal disease: Secondary | ICD-10-CM | POA: Diagnosis not present

## 2015-08-24 DIAGNOSIS — D509 Iron deficiency anemia, unspecified: Secondary | ICD-10-CM | POA: Diagnosis not present

## 2015-08-27 DIAGNOSIS — Z992 Dependence on renal dialysis: Secondary | ICD-10-CM | POA: Diagnosis not present

## 2015-08-27 DIAGNOSIS — N2581 Secondary hyperparathyroidism of renal origin: Secondary | ICD-10-CM | POA: Diagnosis not present

## 2015-08-27 DIAGNOSIS — D509 Iron deficiency anemia, unspecified: Secondary | ICD-10-CM | POA: Diagnosis not present

## 2015-08-27 DIAGNOSIS — D631 Anemia in chronic kidney disease: Secondary | ICD-10-CM | POA: Diagnosis not present

## 2015-08-27 DIAGNOSIS — N186 End stage renal disease: Secondary | ICD-10-CM | POA: Diagnosis not present

## 2015-08-29 DIAGNOSIS — N2581 Secondary hyperparathyroidism of renal origin: Secondary | ICD-10-CM | POA: Diagnosis not present

## 2015-08-29 DIAGNOSIS — Z992 Dependence on renal dialysis: Secondary | ICD-10-CM | POA: Diagnosis not present

## 2015-08-29 DIAGNOSIS — D631 Anemia in chronic kidney disease: Secondary | ICD-10-CM | POA: Diagnosis not present

## 2015-08-29 DIAGNOSIS — N186 End stage renal disease: Secondary | ICD-10-CM | POA: Diagnosis not present

## 2015-08-29 DIAGNOSIS — D509 Iron deficiency anemia, unspecified: Secondary | ICD-10-CM | POA: Diagnosis not present

## 2015-08-31 DIAGNOSIS — N186 End stage renal disease: Secondary | ICD-10-CM | POA: Diagnosis not present

## 2015-08-31 DIAGNOSIS — D509 Iron deficiency anemia, unspecified: Secondary | ICD-10-CM | POA: Diagnosis not present

## 2015-08-31 DIAGNOSIS — D631 Anemia in chronic kidney disease: Secondary | ICD-10-CM | POA: Diagnosis not present

## 2015-08-31 DIAGNOSIS — Z992 Dependence on renal dialysis: Secondary | ICD-10-CM | POA: Diagnosis not present

## 2015-08-31 DIAGNOSIS — N2581 Secondary hyperparathyroidism of renal origin: Secondary | ICD-10-CM | POA: Diagnosis not present

## 2015-09-03 DIAGNOSIS — D631 Anemia in chronic kidney disease: Secondary | ICD-10-CM | POA: Diagnosis not present

## 2015-09-03 DIAGNOSIS — N186 End stage renal disease: Secondary | ICD-10-CM | POA: Diagnosis not present

## 2015-09-03 DIAGNOSIS — D509 Iron deficiency anemia, unspecified: Secondary | ICD-10-CM | POA: Diagnosis not present

## 2015-09-03 DIAGNOSIS — Z992 Dependence on renal dialysis: Secondary | ICD-10-CM | POA: Diagnosis not present

## 2015-09-03 DIAGNOSIS — N2581 Secondary hyperparathyroidism of renal origin: Secondary | ICD-10-CM | POA: Diagnosis not present

## 2015-09-05 DIAGNOSIS — D631 Anemia in chronic kidney disease: Secondary | ICD-10-CM | POA: Diagnosis not present

## 2015-09-05 DIAGNOSIS — N2581 Secondary hyperparathyroidism of renal origin: Secondary | ICD-10-CM | POA: Diagnosis not present

## 2015-09-05 DIAGNOSIS — Z992 Dependence on renal dialysis: Secondary | ICD-10-CM | POA: Diagnosis not present

## 2015-09-05 DIAGNOSIS — D509 Iron deficiency anemia, unspecified: Secondary | ICD-10-CM | POA: Diagnosis not present

## 2015-09-05 DIAGNOSIS — N186 End stage renal disease: Secondary | ICD-10-CM | POA: Diagnosis not present

## 2015-09-07 DIAGNOSIS — D631 Anemia in chronic kidney disease: Secondary | ICD-10-CM | POA: Diagnosis not present

## 2015-09-07 DIAGNOSIS — N2581 Secondary hyperparathyroidism of renal origin: Secondary | ICD-10-CM | POA: Diagnosis not present

## 2015-09-07 DIAGNOSIS — D509 Iron deficiency anemia, unspecified: Secondary | ICD-10-CM | POA: Diagnosis not present

## 2015-09-07 DIAGNOSIS — N186 End stage renal disease: Secondary | ICD-10-CM | POA: Diagnosis not present

## 2015-09-07 DIAGNOSIS — Z992 Dependence on renal dialysis: Secondary | ICD-10-CM | POA: Diagnosis not present

## 2015-09-10 DIAGNOSIS — D509 Iron deficiency anemia, unspecified: Secondary | ICD-10-CM | POA: Diagnosis not present

## 2015-09-10 DIAGNOSIS — Z992 Dependence on renal dialysis: Secondary | ICD-10-CM | POA: Diagnosis not present

## 2015-09-10 DIAGNOSIS — N186 End stage renal disease: Secondary | ICD-10-CM | POA: Diagnosis not present

## 2015-09-10 DIAGNOSIS — D631 Anemia in chronic kidney disease: Secondary | ICD-10-CM | POA: Diagnosis not present

## 2015-09-10 DIAGNOSIS — N2581 Secondary hyperparathyroidism of renal origin: Secondary | ICD-10-CM | POA: Diagnosis not present

## 2015-09-12 DIAGNOSIS — D631 Anemia in chronic kidney disease: Secondary | ICD-10-CM | POA: Diagnosis not present

## 2015-09-12 DIAGNOSIS — D509 Iron deficiency anemia, unspecified: Secondary | ICD-10-CM | POA: Diagnosis not present

## 2015-09-12 DIAGNOSIS — N186 End stage renal disease: Secondary | ICD-10-CM | POA: Diagnosis not present

## 2015-09-12 DIAGNOSIS — N2581 Secondary hyperparathyroidism of renal origin: Secondary | ICD-10-CM | POA: Diagnosis not present

## 2015-09-12 DIAGNOSIS — Z992 Dependence on renal dialysis: Secondary | ICD-10-CM | POA: Diagnosis not present

## 2015-09-14 DIAGNOSIS — D631 Anemia in chronic kidney disease: Secondary | ICD-10-CM | POA: Diagnosis not present

## 2015-09-14 DIAGNOSIS — N186 End stage renal disease: Secondary | ICD-10-CM | POA: Diagnosis not present

## 2015-09-14 DIAGNOSIS — Z992 Dependence on renal dialysis: Secondary | ICD-10-CM | POA: Diagnosis not present

## 2015-09-14 DIAGNOSIS — N2581 Secondary hyperparathyroidism of renal origin: Secondary | ICD-10-CM | POA: Diagnosis not present

## 2015-09-14 DIAGNOSIS — D509 Iron deficiency anemia, unspecified: Secondary | ICD-10-CM | POA: Diagnosis not present

## 2015-09-17 DIAGNOSIS — N186 End stage renal disease: Secondary | ICD-10-CM | POA: Diagnosis not present

## 2015-09-17 DIAGNOSIS — D509 Iron deficiency anemia, unspecified: Secondary | ICD-10-CM | POA: Diagnosis not present

## 2015-09-17 DIAGNOSIS — Z992 Dependence on renal dialysis: Secondary | ICD-10-CM | POA: Diagnosis not present

## 2015-09-17 DIAGNOSIS — N2581 Secondary hyperparathyroidism of renal origin: Secondary | ICD-10-CM | POA: Diagnosis not present

## 2015-09-17 DIAGNOSIS — D631 Anemia in chronic kidney disease: Secondary | ICD-10-CM | POA: Diagnosis not present

## 2015-09-19 DIAGNOSIS — D509 Iron deficiency anemia, unspecified: Secondary | ICD-10-CM | POA: Diagnosis not present

## 2015-09-19 DIAGNOSIS — D631 Anemia in chronic kidney disease: Secondary | ICD-10-CM | POA: Diagnosis not present

## 2015-09-19 DIAGNOSIS — Z992 Dependence on renal dialysis: Secondary | ICD-10-CM | POA: Diagnosis not present

## 2015-09-19 DIAGNOSIS — N2581 Secondary hyperparathyroidism of renal origin: Secondary | ICD-10-CM | POA: Diagnosis not present

## 2015-09-19 DIAGNOSIS — N186 End stage renal disease: Secondary | ICD-10-CM | POA: Diagnosis not present

## 2015-09-21 DIAGNOSIS — Z992 Dependence on renal dialysis: Secondary | ICD-10-CM | POA: Diagnosis not present

## 2015-09-21 DIAGNOSIS — N186 End stage renal disease: Secondary | ICD-10-CM | POA: Diagnosis not present

## 2015-09-21 DIAGNOSIS — D509 Iron deficiency anemia, unspecified: Secondary | ICD-10-CM | POA: Diagnosis not present

## 2015-09-21 DIAGNOSIS — D631 Anemia in chronic kidney disease: Secondary | ICD-10-CM | POA: Diagnosis not present

## 2015-09-21 DIAGNOSIS — N2581 Secondary hyperparathyroidism of renal origin: Secondary | ICD-10-CM | POA: Diagnosis not present

## 2015-09-24 DIAGNOSIS — N2581 Secondary hyperparathyroidism of renal origin: Secondary | ICD-10-CM | POA: Diagnosis not present

## 2015-09-24 DIAGNOSIS — E119 Type 2 diabetes mellitus without complications: Secondary | ICD-10-CM | POA: Diagnosis not present

## 2015-09-24 DIAGNOSIS — N186 End stage renal disease: Secondary | ICD-10-CM | POA: Diagnosis not present

## 2015-09-24 DIAGNOSIS — D509 Iron deficiency anemia, unspecified: Secondary | ICD-10-CM | POA: Diagnosis not present

## 2015-09-24 DIAGNOSIS — D631 Anemia in chronic kidney disease: Secondary | ICD-10-CM | POA: Diagnosis not present

## 2015-09-24 DIAGNOSIS — Z992 Dependence on renal dialysis: Secondary | ICD-10-CM | POA: Diagnosis not present

## 2015-09-25 DIAGNOSIS — Y841 Kidney dialysis as the cause of abnormal reaction of the patient, or of later complication, without mention of misadventure at the time of the procedure: Secondary | ICD-10-CM | POA: Diagnosis not present

## 2015-09-25 DIAGNOSIS — T82318A Breakdown (mechanical) of other vascular grafts, initial encounter: Secondary | ICD-10-CM | POA: Diagnosis not present

## 2015-09-25 DIAGNOSIS — N186 End stage renal disease: Secondary | ICD-10-CM | POA: Diagnosis not present

## 2015-09-25 DIAGNOSIS — Z992 Dependence on renal dialysis: Secondary | ICD-10-CM | POA: Diagnosis not present

## 2015-09-25 DIAGNOSIS — T82858A Stenosis of vascular prosthetic devices, implants and grafts, initial encounter: Secondary | ICD-10-CM | POA: Diagnosis not present

## 2015-09-25 DIAGNOSIS — I10 Essential (primary) hypertension: Secondary | ICD-10-CM | POA: Diagnosis not present

## 2015-09-26 DIAGNOSIS — D631 Anemia in chronic kidney disease: Secondary | ICD-10-CM | POA: Diagnosis not present

## 2015-09-26 DIAGNOSIS — N2581 Secondary hyperparathyroidism of renal origin: Secondary | ICD-10-CM | POA: Diagnosis not present

## 2015-09-26 DIAGNOSIS — Z992 Dependence on renal dialysis: Secondary | ICD-10-CM | POA: Diagnosis not present

## 2015-09-26 DIAGNOSIS — D509 Iron deficiency anemia, unspecified: Secondary | ICD-10-CM | POA: Diagnosis not present

## 2015-09-26 DIAGNOSIS — N186 End stage renal disease: Secondary | ICD-10-CM | POA: Diagnosis not present

## 2015-09-28 DIAGNOSIS — D631 Anemia in chronic kidney disease: Secondary | ICD-10-CM | POA: Diagnosis not present

## 2015-09-28 DIAGNOSIS — N186 End stage renal disease: Secondary | ICD-10-CM | POA: Diagnosis not present

## 2015-09-28 DIAGNOSIS — Z992 Dependence on renal dialysis: Secondary | ICD-10-CM | POA: Diagnosis not present

## 2015-09-28 DIAGNOSIS — D509 Iron deficiency anemia, unspecified: Secondary | ICD-10-CM | POA: Diagnosis not present

## 2015-09-28 DIAGNOSIS — N2581 Secondary hyperparathyroidism of renal origin: Secondary | ICD-10-CM | POA: Diagnosis not present

## 2015-10-01 DIAGNOSIS — N186 End stage renal disease: Secondary | ICD-10-CM | POA: Diagnosis not present

## 2015-10-01 DIAGNOSIS — G40901 Epilepsy, unspecified, not intractable, with status epilepticus: Secondary | ICD-10-CM | POA: Insufficient documentation

## 2015-10-01 DIAGNOSIS — N2581 Secondary hyperparathyroidism of renal origin: Secondary | ICD-10-CM | POA: Diagnosis not present

## 2015-10-01 DIAGNOSIS — D631 Anemia in chronic kidney disease: Secondary | ICD-10-CM | POA: Diagnosis not present

## 2015-10-01 DIAGNOSIS — M6281 Muscle weakness (generalized): Secondary | ICD-10-CM | POA: Insufficient documentation

## 2015-10-01 DIAGNOSIS — D509 Iron deficiency anemia, unspecified: Secondary | ICD-10-CM | POA: Diagnosis not present

## 2015-10-01 DIAGNOSIS — G40909 Epilepsy, unspecified, not intractable, without status epilepticus: Secondary | ICD-10-CM | POA: Insufficient documentation

## 2015-10-01 DIAGNOSIS — Z992 Dependence on renal dialysis: Secondary | ICD-10-CM | POA: Diagnosis not present

## 2015-10-03 DIAGNOSIS — N2581 Secondary hyperparathyroidism of renal origin: Secondary | ICD-10-CM | POA: Diagnosis not present

## 2015-10-03 DIAGNOSIS — N186 End stage renal disease: Secondary | ICD-10-CM | POA: Diagnosis not present

## 2015-10-03 DIAGNOSIS — D631 Anemia in chronic kidney disease: Secondary | ICD-10-CM | POA: Diagnosis not present

## 2015-10-03 DIAGNOSIS — Z992 Dependence on renal dialysis: Secondary | ICD-10-CM | POA: Diagnosis not present

## 2015-10-03 DIAGNOSIS — D509 Iron deficiency anemia, unspecified: Secondary | ICD-10-CM | POA: Diagnosis not present

## 2015-10-05 DIAGNOSIS — D631 Anemia in chronic kidney disease: Secondary | ICD-10-CM | POA: Diagnosis not present

## 2015-10-05 DIAGNOSIS — D509 Iron deficiency anemia, unspecified: Secondary | ICD-10-CM | POA: Diagnosis not present

## 2015-10-05 DIAGNOSIS — N186 End stage renal disease: Secondary | ICD-10-CM | POA: Diagnosis not present

## 2015-10-05 DIAGNOSIS — Z992 Dependence on renal dialysis: Secondary | ICD-10-CM | POA: Diagnosis not present

## 2015-10-05 DIAGNOSIS — N2581 Secondary hyperparathyroidism of renal origin: Secondary | ICD-10-CM | POA: Diagnosis not present

## 2015-10-08 DIAGNOSIS — N186 End stage renal disease: Secondary | ICD-10-CM | POA: Diagnosis not present

## 2015-10-08 DIAGNOSIS — D509 Iron deficiency anemia, unspecified: Secondary | ICD-10-CM | POA: Diagnosis not present

## 2015-10-08 DIAGNOSIS — D631 Anemia in chronic kidney disease: Secondary | ICD-10-CM | POA: Diagnosis not present

## 2015-10-08 DIAGNOSIS — N2581 Secondary hyperparathyroidism of renal origin: Secondary | ICD-10-CM | POA: Diagnosis not present

## 2015-10-08 DIAGNOSIS — Z992 Dependence on renal dialysis: Secondary | ICD-10-CM | POA: Diagnosis not present

## 2015-10-10 DIAGNOSIS — N186 End stage renal disease: Secondary | ICD-10-CM | POA: Diagnosis not present

## 2015-10-10 DIAGNOSIS — D631 Anemia in chronic kidney disease: Secondary | ICD-10-CM | POA: Diagnosis not present

## 2015-10-10 DIAGNOSIS — D509 Iron deficiency anemia, unspecified: Secondary | ICD-10-CM | POA: Diagnosis not present

## 2015-10-10 DIAGNOSIS — Z992 Dependence on renal dialysis: Secondary | ICD-10-CM | POA: Diagnosis not present

## 2015-10-10 DIAGNOSIS — N2581 Secondary hyperparathyroidism of renal origin: Secondary | ICD-10-CM | POA: Diagnosis not present

## 2015-10-12 DIAGNOSIS — D509 Iron deficiency anemia, unspecified: Secondary | ICD-10-CM | POA: Diagnosis not present

## 2015-10-12 DIAGNOSIS — N186 End stage renal disease: Secondary | ICD-10-CM | POA: Diagnosis not present

## 2015-10-12 DIAGNOSIS — Z992 Dependence on renal dialysis: Secondary | ICD-10-CM | POA: Diagnosis not present

## 2015-10-12 DIAGNOSIS — D631 Anemia in chronic kidney disease: Secondary | ICD-10-CM | POA: Diagnosis not present

## 2015-10-12 DIAGNOSIS — N2581 Secondary hyperparathyroidism of renal origin: Secondary | ICD-10-CM | POA: Diagnosis not present

## 2015-10-15 DIAGNOSIS — D631 Anemia in chronic kidney disease: Secondary | ICD-10-CM | POA: Diagnosis not present

## 2015-10-15 DIAGNOSIS — N2581 Secondary hyperparathyroidism of renal origin: Secondary | ICD-10-CM | POA: Diagnosis not present

## 2015-10-15 DIAGNOSIS — D509 Iron deficiency anemia, unspecified: Secondary | ICD-10-CM | POA: Diagnosis not present

## 2015-10-15 DIAGNOSIS — N186 End stage renal disease: Secondary | ICD-10-CM | POA: Diagnosis not present

## 2015-10-15 DIAGNOSIS — Z992 Dependence on renal dialysis: Secondary | ICD-10-CM | POA: Diagnosis not present

## 2015-10-17 DIAGNOSIS — Z992 Dependence on renal dialysis: Secondary | ICD-10-CM | POA: Diagnosis not present

## 2015-10-17 DIAGNOSIS — N186 End stage renal disease: Secondary | ICD-10-CM | POA: Diagnosis not present

## 2015-10-17 DIAGNOSIS — D631 Anemia in chronic kidney disease: Secondary | ICD-10-CM | POA: Diagnosis not present

## 2015-10-17 DIAGNOSIS — N2581 Secondary hyperparathyroidism of renal origin: Secondary | ICD-10-CM | POA: Diagnosis not present

## 2015-10-17 DIAGNOSIS — D509 Iron deficiency anemia, unspecified: Secondary | ICD-10-CM | POA: Diagnosis not present

## 2015-10-19 DIAGNOSIS — Z992 Dependence on renal dialysis: Secondary | ICD-10-CM | POA: Diagnosis not present

## 2015-10-19 DIAGNOSIS — N186 End stage renal disease: Secondary | ICD-10-CM | POA: Diagnosis not present

## 2015-10-19 DIAGNOSIS — D509 Iron deficiency anemia, unspecified: Secondary | ICD-10-CM | POA: Diagnosis not present

## 2015-10-19 DIAGNOSIS — N2581 Secondary hyperparathyroidism of renal origin: Secondary | ICD-10-CM | POA: Diagnosis not present

## 2015-10-19 DIAGNOSIS — D631 Anemia in chronic kidney disease: Secondary | ICD-10-CM | POA: Diagnosis not present

## 2015-10-22 DIAGNOSIS — N2581 Secondary hyperparathyroidism of renal origin: Secondary | ICD-10-CM | POA: Diagnosis not present

## 2015-10-22 DIAGNOSIS — D631 Anemia in chronic kidney disease: Secondary | ICD-10-CM | POA: Diagnosis not present

## 2015-10-22 DIAGNOSIS — D509 Iron deficiency anemia, unspecified: Secondary | ICD-10-CM | POA: Diagnosis not present

## 2015-10-22 DIAGNOSIS — N186 End stage renal disease: Secondary | ICD-10-CM | POA: Diagnosis not present

## 2015-10-22 DIAGNOSIS — Z992 Dependence on renal dialysis: Secondary | ICD-10-CM | POA: Diagnosis not present

## 2015-10-24 DIAGNOSIS — N186 End stage renal disease: Secondary | ICD-10-CM | POA: Diagnosis not present

## 2015-10-24 DIAGNOSIS — Z992 Dependence on renal dialysis: Secondary | ICD-10-CM | POA: Diagnosis not present

## 2015-10-24 DIAGNOSIS — D509 Iron deficiency anemia, unspecified: Secondary | ICD-10-CM | POA: Diagnosis not present

## 2015-10-24 DIAGNOSIS — D631 Anemia in chronic kidney disease: Secondary | ICD-10-CM | POA: Diagnosis not present

## 2015-10-24 DIAGNOSIS — N2581 Secondary hyperparathyroidism of renal origin: Secondary | ICD-10-CM | POA: Diagnosis not present

## 2015-10-26 DIAGNOSIS — Z992 Dependence on renal dialysis: Secondary | ICD-10-CM | POA: Diagnosis not present

## 2015-10-26 DIAGNOSIS — N186 End stage renal disease: Secondary | ICD-10-CM | POA: Diagnosis not present

## 2015-10-26 DIAGNOSIS — D631 Anemia in chronic kidney disease: Secondary | ICD-10-CM | POA: Diagnosis not present

## 2015-10-26 DIAGNOSIS — D509 Iron deficiency anemia, unspecified: Secondary | ICD-10-CM | POA: Diagnosis not present

## 2015-10-26 DIAGNOSIS — N2581 Secondary hyperparathyroidism of renal origin: Secondary | ICD-10-CM | POA: Diagnosis not present

## 2015-10-29 DIAGNOSIS — N2581 Secondary hyperparathyroidism of renal origin: Secondary | ICD-10-CM | POA: Diagnosis not present

## 2015-10-29 DIAGNOSIS — D631 Anemia in chronic kidney disease: Secondary | ICD-10-CM | POA: Diagnosis not present

## 2015-10-29 DIAGNOSIS — Z992 Dependence on renal dialysis: Secondary | ICD-10-CM | POA: Diagnosis not present

## 2015-10-29 DIAGNOSIS — N186 End stage renal disease: Secondary | ICD-10-CM | POA: Diagnosis not present

## 2015-10-29 DIAGNOSIS — D509 Iron deficiency anemia, unspecified: Secondary | ICD-10-CM | POA: Diagnosis not present

## 2015-10-31 DIAGNOSIS — N186 End stage renal disease: Secondary | ICD-10-CM | POA: Diagnosis not present

## 2015-10-31 DIAGNOSIS — D631 Anemia in chronic kidney disease: Secondary | ICD-10-CM | POA: Diagnosis not present

## 2015-10-31 DIAGNOSIS — Z992 Dependence on renal dialysis: Secondary | ICD-10-CM | POA: Diagnosis not present

## 2015-10-31 DIAGNOSIS — D509 Iron deficiency anemia, unspecified: Secondary | ICD-10-CM | POA: Diagnosis not present

## 2015-10-31 DIAGNOSIS — N2581 Secondary hyperparathyroidism of renal origin: Secondary | ICD-10-CM | POA: Diagnosis not present

## 2015-11-02 DIAGNOSIS — Z992 Dependence on renal dialysis: Secondary | ICD-10-CM | POA: Diagnosis not present

## 2015-11-02 DIAGNOSIS — D631 Anemia in chronic kidney disease: Secondary | ICD-10-CM | POA: Diagnosis not present

## 2015-11-02 DIAGNOSIS — N2581 Secondary hyperparathyroidism of renal origin: Secondary | ICD-10-CM | POA: Diagnosis not present

## 2015-11-02 DIAGNOSIS — N186 End stage renal disease: Secondary | ICD-10-CM | POA: Diagnosis not present

## 2015-11-02 DIAGNOSIS — D509 Iron deficiency anemia, unspecified: Secondary | ICD-10-CM | POA: Diagnosis not present

## 2015-11-05 DIAGNOSIS — N2581 Secondary hyperparathyroidism of renal origin: Secondary | ICD-10-CM | POA: Diagnosis not present

## 2015-11-05 DIAGNOSIS — D509 Iron deficiency anemia, unspecified: Secondary | ICD-10-CM | POA: Diagnosis not present

## 2015-11-05 DIAGNOSIS — D631 Anemia in chronic kidney disease: Secondary | ICD-10-CM | POA: Diagnosis not present

## 2015-11-05 DIAGNOSIS — Z992 Dependence on renal dialysis: Secondary | ICD-10-CM | POA: Diagnosis not present

## 2015-11-05 DIAGNOSIS — N186 End stage renal disease: Secondary | ICD-10-CM | POA: Diagnosis not present

## 2015-11-07 DIAGNOSIS — N2581 Secondary hyperparathyroidism of renal origin: Secondary | ICD-10-CM | POA: Diagnosis not present

## 2015-11-07 DIAGNOSIS — D509 Iron deficiency anemia, unspecified: Secondary | ICD-10-CM | POA: Diagnosis not present

## 2015-11-07 DIAGNOSIS — Z992 Dependence on renal dialysis: Secondary | ICD-10-CM | POA: Diagnosis not present

## 2015-11-07 DIAGNOSIS — N186 End stage renal disease: Secondary | ICD-10-CM | POA: Diagnosis not present

## 2015-11-07 DIAGNOSIS — D631 Anemia in chronic kidney disease: Secondary | ICD-10-CM | POA: Diagnosis not present

## 2015-11-09 DIAGNOSIS — D631 Anemia in chronic kidney disease: Secondary | ICD-10-CM | POA: Diagnosis not present

## 2015-11-09 DIAGNOSIS — N186 End stage renal disease: Secondary | ICD-10-CM | POA: Diagnosis not present

## 2015-11-09 DIAGNOSIS — Z992 Dependence on renal dialysis: Secondary | ICD-10-CM | POA: Diagnosis not present

## 2015-11-09 DIAGNOSIS — D509 Iron deficiency anemia, unspecified: Secondary | ICD-10-CM | POA: Diagnosis not present

## 2015-11-09 DIAGNOSIS — N2581 Secondary hyperparathyroidism of renal origin: Secondary | ICD-10-CM | POA: Diagnosis not present

## 2015-11-12 DIAGNOSIS — D509 Iron deficiency anemia, unspecified: Secondary | ICD-10-CM | POA: Diagnosis not present

## 2015-11-12 DIAGNOSIS — D631 Anemia in chronic kidney disease: Secondary | ICD-10-CM | POA: Diagnosis not present

## 2015-11-12 DIAGNOSIS — Z992 Dependence on renal dialysis: Secondary | ICD-10-CM | POA: Diagnosis not present

## 2015-11-12 DIAGNOSIS — N2581 Secondary hyperparathyroidism of renal origin: Secondary | ICD-10-CM | POA: Diagnosis not present

## 2015-11-12 DIAGNOSIS — N186 End stage renal disease: Secondary | ICD-10-CM | POA: Diagnosis not present

## 2015-11-14 DIAGNOSIS — D631 Anemia in chronic kidney disease: Secondary | ICD-10-CM | POA: Diagnosis not present

## 2015-11-14 DIAGNOSIS — N186 End stage renal disease: Secondary | ICD-10-CM | POA: Diagnosis not present

## 2015-11-14 DIAGNOSIS — Z992 Dependence on renal dialysis: Secondary | ICD-10-CM | POA: Diagnosis not present

## 2015-11-14 DIAGNOSIS — D509 Iron deficiency anemia, unspecified: Secondary | ICD-10-CM | POA: Diagnosis not present

## 2015-11-14 DIAGNOSIS — N2581 Secondary hyperparathyroidism of renal origin: Secondary | ICD-10-CM | POA: Diagnosis not present

## 2015-11-16 DIAGNOSIS — Z23 Encounter for immunization: Secondary | ICD-10-CM | POA: Diagnosis not present

## 2015-11-16 DIAGNOSIS — N186 End stage renal disease: Secondary | ICD-10-CM | POA: Diagnosis not present

## 2015-11-16 DIAGNOSIS — D509 Iron deficiency anemia, unspecified: Secondary | ICD-10-CM | POA: Diagnosis not present

## 2015-11-16 DIAGNOSIS — Z992 Dependence on renal dialysis: Secondary | ICD-10-CM | POA: Diagnosis not present

## 2015-11-16 DIAGNOSIS — N2581 Secondary hyperparathyroidism of renal origin: Secondary | ICD-10-CM | POA: Diagnosis not present

## 2015-11-19 DIAGNOSIS — N186 End stage renal disease: Secondary | ICD-10-CM | POA: Diagnosis not present

## 2015-11-19 DIAGNOSIS — Z992 Dependence on renal dialysis: Secondary | ICD-10-CM | POA: Diagnosis not present

## 2015-11-19 DIAGNOSIS — D509 Iron deficiency anemia, unspecified: Secondary | ICD-10-CM | POA: Diagnosis not present

## 2015-11-19 DIAGNOSIS — N2581 Secondary hyperparathyroidism of renal origin: Secondary | ICD-10-CM | POA: Diagnosis not present

## 2015-11-19 DIAGNOSIS — Z23 Encounter for immunization: Secondary | ICD-10-CM | POA: Diagnosis not present

## 2015-11-21 DIAGNOSIS — D509 Iron deficiency anemia, unspecified: Secondary | ICD-10-CM | POA: Diagnosis not present

## 2015-11-21 DIAGNOSIS — N2581 Secondary hyperparathyroidism of renal origin: Secondary | ICD-10-CM | POA: Diagnosis not present

## 2015-11-21 DIAGNOSIS — Z23 Encounter for immunization: Secondary | ICD-10-CM | POA: Diagnosis not present

## 2015-11-21 DIAGNOSIS — Z992 Dependence on renal dialysis: Secondary | ICD-10-CM | POA: Diagnosis not present

## 2015-11-21 DIAGNOSIS — N186 End stage renal disease: Secondary | ICD-10-CM | POA: Diagnosis not present

## 2015-11-23 DIAGNOSIS — D509 Iron deficiency anemia, unspecified: Secondary | ICD-10-CM | POA: Diagnosis not present

## 2015-11-23 DIAGNOSIS — Z992 Dependence on renal dialysis: Secondary | ICD-10-CM | POA: Diagnosis not present

## 2015-11-23 DIAGNOSIS — N186 End stage renal disease: Secondary | ICD-10-CM | POA: Diagnosis not present

## 2015-11-23 DIAGNOSIS — Z23 Encounter for immunization: Secondary | ICD-10-CM | POA: Diagnosis not present

## 2015-11-23 DIAGNOSIS — N2581 Secondary hyperparathyroidism of renal origin: Secondary | ICD-10-CM | POA: Diagnosis not present

## 2015-11-26 DIAGNOSIS — N186 End stage renal disease: Secondary | ICD-10-CM | POA: Diagnosis not present

## 2015-11-26 DIAGNOSIS — D509 Iron deficiency anemia, unspecified: Secondary | ICD-10-CM | POA: Diagnosis not present

## 2015-11-26 DIAGNOSIS — N2581 Secondary hyperparathyroidism of renal origin: Secondary | ICD-10-CM | POA: Diagnosis not present

## 2015-11-26 DIAGNOSIS — Z23 Encounter for immunization: Secondary | ICD-10-CM | POA: Diagnosis not present

## 2015-11-26 DIAGNOSIS — Z992 Dependence on renal dialysis: Secondary | ICD-10-CM | POA: Diagnosis not present

## 2015-11-28 DIAGNOSIS — N2581 Secondary hyperparathyroidism of renal origin: Secondary | ICD-10-CM | POA: Diagnosis not present

## 2015-11-28 DIAGNOSIS — Z992 Dependence on renal dialysis: Secondary | ICD-10-CM | POA: Diagnosis not present

## 2015-11-28 DIAGNOSIS — Z23 Encounter for immunization: Secondary | ICD-10-CM | POA: Diagnosis not present

## 2015-11-28 DIAGNOSIS — N186 End stage renal disease: Secondary | ICD-10-CM | POA: Diagnosis not present

## 2015-11-28 DIAGNOSIS — D509 Iron deficiency anemia, unspecified: Secondary | ICD-10-CM | POA: Diagnosis not present

## 2015-11-30 DIAGNOSIS — N186 End stage renal disease: Secondary | ICD-10-CM | POA: Diagnosis not present

## 2015-11-30 DIAGNOSIS — Z992 Dependence on renal dialysis: Secondary | ICD-10-CM | POA: Diagnosis not present

## 2015-11-30 DIAGNOSIS — N2581 Secondary hyperparathyroidism of renal origin: Secondary | ICD-10-CM | POA: Diagnosis not present

## 2015-11-30 DIAGNOSIS — Z23 Encounter for immunization: Secondary | ICD-10-CM | POA: Diagnosis not present

## 2015-11-30 DIAGNOSIS — D509 Iron deficiency anemia, unspecified: Secondary | ICD-10-CM | POA: Diagnosis not present

## 2015-12-03 DIAGNOSIS — N2581 Secondary hyperparathyroidism of renal origin: Secondary | ICD-10-CM | POA: Diagnosis not present

## 2015-12-03 DIAGNOSIS — Z23 Encounter for immunization: Secondary | ICD-10-CM | POA: Diagnosis not present

## 2015-12-03 DIAGNOSIS — Z992 Dependence on renal dialysis: Secondary | ICD-10-CM | POA: Diagnosis not present

## 2015-12-03 DIAGNOSIS — D509 Iron deficiency anemia, unspecified: Secondary | ICD-10-CM | POA: Diagnosis not present

## 2015-12-03 DIAGNOSIS — N186 End stage renal disease: Secondary | ICD-10-CM | POA: Diagnosis not present

## 2015-12-05 DIAGNOSIS — Z23 Encounter for immunization: Secondary | ICD-10-CM | POA: Diagnosis not present

## 2015-12-05 DIAGNOSIS — N186 End stage renal disease: Secondary | ICD-10-CM | POA: Diagnosis not present

## 2015-12-05 DIAGNOSIS — D509 Iron deficiency anemia, unspecified: Secondary | ICD-10-CM | POA: Diagnosis not present

## 2015-12-05 DIAGNOSIS — N2581 Secondary hyperparathyroidism of renal origin: Secondary | ICD-10-CM | POA: Diagnosis not present

## 2015-12-05 DIAGNOSIS — Z992 Dependence on renal dialysis: Secondary | ICD-10-CM | POA: Diagnosis not present

## 2015-12-07 DIAGNOSIS — Z23 Encounter for immunization: Secondary | ICD-10-CM | POA: Diagnosis not present

## 2015-12-07 DIAGNOSIS — Z992 Dependence on renal dialysis: Secondary | ICD-10-CM | POA: Diagnosis not present

## 2015-12-07 DIAGNOSIS — D509 Iron deficiency anemia, unspecified: Secondary | ICD-10-CM | POA: Diagnosis not present

## 2015-12-07 DIAGNOSIS — N186 End stage renal disease: Secondary | ICD-10-CM | POA: Diagnosis not present

## 2015-12-07 DIAGNOSIS — N2581 Secondary hyperparathyroidism of renal origin: Secondary | ICD-10-CM | POA: Diagnosis not present

## 2015-12-10 DIAGNOSIS — D509 Iron deficiency anemia, unspecified: Secondary | ICD-10-CM | POA: Diagnosis not present

## 2015-12-10 DIAGNOSIS — N2581 Secondary hyperparathyroidism of renal origin: Secondary | ICD-10-CM | POA: Diagnosis not present

## 2015-12-10 DIAGNOSIS — N186 End stage renal disease: Secondary | ICD-10-CM | POA: Diagnosis not present

## 2015-12-10 DIAGNOSIS — Z23 Encounter for immunization: Secondary | ICD-10-CM | POA: Diagnosis not present

## 2015-12-10 DIAGNOSIS — Z992 Dependence on renal dialysis: Secondary | ICD-10-CM | POA: Diagnosis not present

## 2015-12-12 DIAGNOSIS — Z992 Dependence on renal dialysis: Secondary | ICD-10-CM | POA: Diagnosis not present

## 2015-12-12 DIAGNOSIS — Z23 Encounter for immunization: Secondary | ICD-10-CM | POA: Diagnosis not present

## 2015-12-12 DIAGNOSIS — D509 Iron deficiency anemia, unspecified: Secondary | ICD-10-CM | POA: Diagnosis not present

## 2015-12-12 DIAGNOSIS — N186 End stage renal disease: Secondary | ICD-10-CM | POA: Diagnosis not present

## 2015-12-12 DIAGNOSIS — N2581 Secondary hyperparathyroidism of renal origin: Secondary | ICD-10-CM | POA: Diagnosis not present

## 2015-12-14 DIAGNOSIS — N2581 Secondary hyperparathyroidism of renal origin: Secondary | ICD-10-CM | POA: Diagnosis not present

## 2015-12-14 DIAGNOSIS — Z23 Encounter for immunization: Secondary | ICD-10-CM | POA: Diagnosis not present

## 2015-12-14 DIAGNOSIS — N186 End stage renal disease: Secondary | ICD-10-CM | POA: Diagnosis not present

## 2015-12-14 DIAGNOSIS — D509 Iron deficiency anemia, unspecified: Secondary | ICD-10-CM | POA: Diagnosis not present

## 2015-12-14 DIAGNOSIS — Z992 Dependence on renal dialysis: Secondary | ICD-10-CM | POA: Diagnosis not present

## 2015-12-15 DIAGNOSIS — N186 End stage renal disease: Secondary | ICD-10-CM | POA: Diagnosis not present

## 2015-12-15 DIAGNOSIS — Z992 Dependence on renal dialysis: Secondary | ICD-10-CM | POA: Diagnosis not present

## 2015-12-17 DIAGNOSIS — N186 End stage renal disease: Secondary | ICD-10-CM | POA: Diagnosis not present

## 2015-12-17 DIAGNOSIS — D631 Anemia in chronic kidney disease: Secondary | ICD-10-CM | POA: Diagnosis not present

## 2015-12-17 DIAGNOSIS — D509 Iron deficiency anemia, unspecified: Secondary | ICD-10-CM | POA: Diagnosis not present

## 2015-12-17 DIAGNOSIS — N2581 Secondary hyperparathyroidism of renal origin: Secondary | ICD-10-CM | POA: Diagnosis not present

## 2015-12-17 DIAGNOSIS — Z992 Dependence on renal dialysis: Secondary | ICD-10-CM | POA: Diagnosis not present

## 2015-12-19 DIAGNOSIS — D509 Iron deficiency anemia, unspecified: Secondary | ICD-10-CM | POA: Diagnosis not present

## 2015-12-19 DIAGNOSIS — Z992 Dependence on renal dialysis: Secondary | ICD-10-CM | POA: Diagnosis not present

## 2015-12-19 DIAGNOSIS — D631 Anemia in chronic kidney disease: Secondary | ICD-10-CM | POA: Diagnosis not present

## 2015-12-19 DIAGNOSIS — N186 End stage renal disease: Secondary | ICD-10-CM | POA: Diagnosis not present

## 2015-12-19 DIAGNOSIS — N2581 Secondary hyperparathyroidism of renal origin: Secondary | ICD-10-CM | POA: Diagnosis not present

## 2015-12-21 DIAGNOSIS — N186 End stage renal disease: Secondary | ICD-10-CM | POA: Diagnosis not present

## 2015-12-21 DIAGNOSIS — D509 Iron deficiency anemia, unspecified: Secondary | ICD-10-CM | POA: Diagnosis not present

## 2015-12-21 DIAGNOSIS — N2581 Secondary hyperparathyroidism of renal origin: Secondary | ICD-10-CM | POA: Diagnosis not present

## 2015-12-21 DIAGNOSIS — Z992 Dependence on renal dialysis: Secondary | ICD-10-CM | POA: Diagnosis not present

## 2015-12-21 DIAGNOSIS — D631 Anemia in chronic kidney disease: Secondary | ICD-10-CM | POA: Diagnosis not present

## 2015-12-24 DIAGNOSIS — N2581 Secondary hyperparathyroidism of renal origin: Secondary | ICD-10-CM | POA: Diagnosis not present

## 2015-12-24 DIAGNOSIS — Z992 Dependence on renal dialysis: Secondary | ICD-10-CM | POA: Diagnosis not present

## 2015-12-24 DIAGNOSIS — D631 Anemia in chronic kidney disease: Secondary | ICD-10-CM | POA: Diagnosis not present

## 2015-12-24 DIAGNOSIS — D509 Iron deficiency anemia, unspecified: Secondary | ICD-10-CM | POA: Diagnosis not present

## 2015-12-24 DIAGNOSIS — E119 Type 2 diabetes mellitus without complications: Secondary | ICD-10-CM | POA: Diagnosis not present

## 2015-12-24 DIAGNOSIS — N186 End stage renal disease: Secondary | ICD-10-CM | POA: Diagnosis not present

## 2015-12-26 DIAGNOSIS — N2581 Secondary hyperparathyroidism of renal origin: Secondary | ICD-10-CM | POA: Diagnosis not present

## 2015-12-26 DIAGNOSIS — D631 Anemia in chronic kidney disease: Secondary | ICD-10-CM | POA: Diagnosis not present

## 2015-12-26 DIAGNOSIS — N186 End stage renal disease: Secondary | ICD-10-CM | POA: Diagnosis not present

## 2015-12-26 DIAGNOSIS — Z992 Dependence on renal dialysis: Secondary | ICD-10-CM | POA: Diagnosis not present

## 2015-12-26 DIAGNOSIS — D509 Iron deficiency anemia, unspecified: Secondary | ICD-10-CM | POA: Diagnosis not present

## 2015-12-28 DIAGNOSIS — D631 Anemia in chronic kidney disease: Secondary | ICD-10-CM | POA: Diagnosis not present

## 2015-12-28 DIAGNOSIS — Z992 Dependence on renal dialysis: Secondary | ICD-10-CM | POA: Diagnosis not present

## 2015-12-28 DIAGNOSIS — N2581 Secondary hyperparathyroidism of renal origin: Secondary | ICD-10-CM | POA: Diagnosis not present

## 2015-12-28 DIAGNOSIS — N186 End stage renal disease: Secondary | ICD-10-CM | POA: Diagnosis not present

## 2015-12-28 DIAGNOSIS — D509 Iron deficiency anemia, unspecified: Secondary | ICD-10-CM | POA: Diagnosis not present

## 2015-12-31 DIAGNOSIS — Z992 Dependence on renal dialysis: Secondary | ICD-10-CM | POA: Diagnosis not present

## 2015-12-31 DIAGNOSIS — N2581 Secondary hyperparathyroidism of renal origin: Secondary | ICD-10-CM | POA: Diagnosis not present

## 2015-12-31 DIAGNOSIS — D509 Iron deficiency anemia, unspecified: Secondary | ICD-10-CM | POA: Diagnosis not present

## 2015-12-31 DIAGNOSIS — N186 End stage renal disease: Secondary | ICD-10-CM | POA: Diagnosis not present

## 2015-12-31 DIAGNOSIS — D631 Anemia in chronic kidney disease: Secondary | ICD-10-CM | POA: Diagnosis not present

## 2016-01-02 DIAGNOSIS — D631 Anemia in chronic kidney disease: Secondary | ICD-10-CM | POA: Diagnosis not present

## 2016-01-02 DIAGNOSIS — N186 End stage renal disease: Secondary | ICD-10-CM | POA: Diagnosis not present

## 2016-01-02 DIAGNOSIS — N2581 Secondary hyperparathyroidism of renal origin: Secondary | ICD-10-CM | POA: Diagnosis not present

## 2016-01-02 DIAGNOSIS — D509 Iron deficiency anemia, unspecified: Secondary | ICD-10-CM | POA: Diagnosis not present

## 2016-01-02 DIAGNOSIS — Z992 Dependence on renal dialysis: Secondary | ICD-10-CM | POA: Diagnosis not present

## 2016-01-04 DIAGNOSIS — Z992 Dependence on renal dialysis: Secondary | ICD-10-CM | POA: Diagnosis not present

## 2016-01-04 DIAGNOSIS — D631 Anemia in chronic kidney disease: Secondary | ICD-10-CM | POA: Diagnosis not present

## 2016-01-04 DIAGNOSIS — N2581 Secondary hyperparathyroidism of renal origin: Secondary | ICD-10-CM | POA: Diagnosis not present

## 2016-01-04 DIAGNOSIS — N186 End stage renal disease: Secondary | ICD-10-CM | POA: Diagnosis not present

## 2016-01-04 DIAGNOSIS — D509 Iron deficiency anemia, unspecified: Secondary | ICD-10-CM | POA: Diagnosis not present

## 2016-01-07 DIAGNOSIS — Z992 Dependence on renal dialysis: Secondary | ICD-10-CM | POA: Diagnosis not present

## 2016-01-07 DIAGNOSIS — N186 End stage renal disease: Secondary | ICD-10-CM | POA: Diagnosis not present

## 2016-01-07 DIAGNOSIS — D509 Iron deficiency anemia, unspecified: Secondary | ICD-10-CM | POA: Diagnosis not present

## 2016-01-07 DIAGNOSIS — N2581 Secondary hyperparathyroidism of renal origin: Secondary | ICD-10-CM | POA: Diagnosis not present

## 2016-01-07 DIAGNOSIS — D631 Anemia in chronic kidney disease: Secondary | ICD-10-CM | POA: Diagnosis not present

## 2016-01-09 DIAGNOSIS — D631 Anemia in chronic kidney disease: Secondary | ICD-10-CM | POA: Diagnosis not present

## 2016-01-09 DIAGNOSIS — N186 End stage renal disease: Secondary | ICD-10-CM | POA: Diagnosis not present

## 2016-01-09 DIAGNOSIS — D509 Iron deficiency anemia, unspecified: Secondary | ICD-10-CM | POA: Diagnosis not present

## 2016-01-09 DIAGNOSIS — Z992 Dependence on renal dialysis: Secondary | ICD-10-CM | POA: Diagnosis not present

## 2016-01-09 DIAGNOSIS — N2581 Secondary hyperparathyroidism of renal origin: Secondary | ICD-10-CM | POA: Diagnosis not present

## 2016-01-11 DIAGNOSIS — D509 Iron deficiency anemia, unspecified: Secondary | ICD-10-CM | POA: Diagnosis not present

## 2016-01-11 DIAGNOSIS — N186 End stage renal disease: Secondary | ICD-10-CM | POA: Diagnosis not present

## 2016-01-11 DIAGNOSIS — N2581 Secondary hyperparathyroidism of renal origin: Secondary | ICD-10-CM | POA: Diagnosis not present

## 2016-01-11 DIAGNOSIS — Z992 Dependence on renal dialysis: Secondary | ICD-10-CM | POA: Diagnosis not present

## 2016-01-11 DIAGNOSIS — D631 Anemia in chronic kidney disease: Secondary | ICD-10-CM | POA: Diagnosis not present

## 2016-01-14 DIAGNOSIS — D509 Iron deficiency anemia, unspecified: Secondary | ICD-10-CM | POA: Diagnosis not present

## 2016-01-14 DIAGNOSIS — N186 End stage renal disease: Secondary | ICD-10-CM | POA: Diagnosis not present

## 2016-01-14 DIAGNOSIS — N2581 Secondary hyperparathyroidism of renal origin: Secondary | ICD-10-CM | POA: Diagnosis not present

## 2016-01-14 DIAGNOSIS — D631 Anemia in chronic kidney disease: Secondary | ICD-10-CM | POA: Diagnosis not present

## 2016-01-14 DIAGNOSIS — Z992 Dependence on renal dialysis: Secondary | ICD-10-CM | POA: Diagnosis not present

## 2016-01-15 DIAGNOSIS — Z992 Dependence on renal dialysis: Secondary | ICD-10-CM | POA: Diagnosis not present

## 2016-01-15 DIAGNOSIS — N186 End stage renal disease: Secondary | ICD-10-CM | POA: Diagnosis not present

## 2016-01-16 DIAGNOSIS — Z992 Dependence on renal dialysis: Secondary | ICD-10-CM | POA: Diagnosis not present

## 2016-01-16 DIAGNOSIS — N2581 Secondary hyperparathyroidism of renal origin: Secondary | ICD-10-CM | POA: Diagnosis not present

## 2016-01-16 DIAGNOSIS — D509 Iron deficiency anemia, unspecified: Secondary | ICD-10-CM | POA: Diagnosis not present

## 2016-01-16 DIAGNOSIS — N186 End stage renal disease: Secondary | ICD-10-CM | POA: Diagnosis not present

## 2016-01-16 DIAGNOSIS — D631 Anemia in chronic kidney disease: Secondary | ICD-10-CM | POA: Diagnosis not present

## 2016-01-18 DIAGNOSIS — D631 Anemia in chronic kidney disease: Secondary | ICD-10-CM | POA: Diagnosis not present

## 2016-01-18 DIAGNOSIS — N186 End stage renal disease: Secondary | ICD-10-CM | POA: Diagnosis not present

## 2016-01-18 DIAGNOSIS — Z992 Dependence on renal dialysis: Secondary | ICD-10-CM | POA: Diagnosis not present

## 2016-01-18 DIAGNOSIS — D509 Iron deficiency anemia, unspecified: Secondary | ICD-10-CM | POA: Diagnosis not present

## 2016-01-18 DIAGNOSIS — N2581 Secondary hyperparathyroidism of renal origin: Secondary | ICD-10-CM | POA: Diagnosis not present

## 2016-01-21 DIAGNOSIS — D631 Anemia in chronic kidney disease: Secondary | ICD-10-CM | POA: Diagnosis not present

## 2016-01-21 DIAGNOSIS — Z992 Dependence on renal dialysis: Secondary | ICD-10-CM | POA: Diagnosis not present

## 2016-01-21 DIAGNOSIS — N2581 Secondary hyperparathyroidism of renal origin: Secondary | ICD-10-CM | POA: Diagnosis not present

## 2016-01-21 DIAGNOSIS — N186 End stage renal disease: Secondary | ICD-10-CM | POA: Diagnosis not present

## 2016-01-21 DIAGNOSIS — D509 Iron deficiency anemia, unspecified: Secondary | ICD-10-CM | POA: Diagnosis not present

## 2016-01-23 DIAGNOSIS — Z992 Dependence on renal dialysis: Secondary | ICD-10-CM | POA: Diagnosis not present

## 2016-01-23 DIAGNOSIS — N186 End stage renal disease: Secondary | ICD-10-CM | POA: Diagnosis not present

## 2016-01-23 DIAGNOSIS — D509 Iron deficiency anemia, unspecified: Secondary | ICD-10-CM | POA: Diagnosis not present

## 2016-01-23 DIAGNOSIS — D631 Anemia in chronic kidney disease: Secondary | ICD-10-CM | POA: Diagnosis not present

## 2016-01-23 DIAGNOSIS — N2581 Secondary hyperparathyroidism of renal origin: Secondary | ICD-10-CM | POA: Diagnosis not present

## 2016-01-25 DIAGNOSIS — D509 Iron deficiency anemia, unspecified: Secondary | ICD-10-CM | POA: Diagnosis not present

## 2016-01-25 DIAGNOSIS — Z992 Dependence on renal dialysis: Secondary | ICD-10-CM | POA: Diagnosis not present

## 2016-01-25 DIAGNOSIS — N186 End stage renal disease: Secondary | ICD-10-CM | POA: Diagnosis not present

## 2016-01-25 DIAGNOSIS — D631 Anemia in chronic kidney disease: Secondary | ICD-10-CM | POA: Diagnosis not present

## 2016-01-25 DIAGNOSIS — N2581 Secondary hyperparathyroidism of renal origin: Secondary | ICD-10-CM | POA: Diagnosis not present

## 2016-01-28 DIAGNOSIS — N2581 Secondary hyperparathyroidism of renal origin: Secondary | ICD-10-CM | POA: Diagnosis not present

## 2016-01-28 DIAGNOSIS — N186 End stage renal disease: Secondary | ICD-10-CM | POA: Diagnosis not present

## 2016-01-28 DIAGNOSIS — D509 Iron deficiency anemia, unspecified: Secondary | ICD-10-CM | POA: Diagnosis not present

## 2016-01-28 DIAGNOSIS — D631 Anemia in chronic kidney disease: Secondary | ICD-10-CM | POA: Diagnosis not present

## 2016-01-28 DIAGNOSIS — Z992 Dependence on renal dialysis: Secondary | ICD-10-CM | POA: Diagnosis not present

## 2016-01-30 DIAGNOSIS — N186 End stage renal disease: Secondary | ICD-10-CM | POA: Diagnosis not present

## 2016-01-30 DIAGNOSIS — N2581 Secondary hyperparathyroidism of renal origin: Secondary | ICD-10-CM | POA: Diagnosis not present

## 2016-01-30 DIAGNOSIS — D509 Iron deficiency anemia, unspecified: Secondary | ICD-10-CM | POA: Diagnosis not present

## 2016-01-30 DIAGNOSIS — Z992 Dependence on renal dialysis: Secondary | ICD-10-CM | POA: Diagnosis not present

## 2016-01-30 DIAGNOSIS — D631 Anemia in chronic kidney disease: Secondary | ICD-10-CM | POA: Diagnosis not present

## 2016-02-01 DIAGNOSIS — N2581 Secondary hyperparathyroidism of renal origin: Secondary | ICD-10-CM | POA: Diagnosis not present

## 2016-02-01 DIAGNOSIS — D631 Anemia in chronic kidney disease: Secondary | ICD-10-CM | POA: Diagnosis not present

## 2016-02-01 DIAGNOSIS — Z992 Dependence on renal dialysis: Secondary | ICD-10-CM | POA: Diagnosis not present

## 2016-02-01 DIAGNOSIS — N186 End stage renal disease: Secondary | ICD-10-CM | POA: Diagnosis not present

## 2016-02-01 DIAGNOSIS — D509 Iron deficiency anemia, unspecified: Secondary | ICD-10-CM | POA: Diagnosis not present

## 2016-02-04 DIAGNOSIS — N2581 Secondary hyperparathyroidism of renal origin: Secondary | ICD-10-CM | POA: Diagnosis not present

## 2016-02-04 DIAGNOSIS — D631 Anemia in chronic kidney disease: Secondary | ICD-10-CM | POA: Diagnosis not present

## 2016-02-04 DIAGNOSIS — D509 Iron deficiency anemia, unspecified: Secondary | ICD-10-CM | POA: Diagnosis not present

## 2016-02-04 DIAGNOSIS — N186 End stage renal disease: Secondary | ICD-10-CM | POA: Diagnosis not present

## 2016-02-04 DIAGNOSIS — Z992 Dependence on renal dialysis: Secondary | ICD-10-CM | POA: Diagnosis not present

## 2016-02-06 DIAGNOSIS — D509 Iron deficiency anemia, unspecified: Secondary | ICD-10-CM | POA: Diagnosis not present

## 2016-02-06 DIAGNOSIS — N2581 Secondary hyperparathyroidism of renal origin: Secondary | ICD-10-CM | POA: Diagnosis not present

## 2016-02-06 DIAGNOSIS — D631 Anemia in chronic kidney disease: Secondary | ICD-10-CM | POA: Diagnosis not present

## 2016-02-06 DIAGNOSIS — Z992 Dependence on renal dialysis: Secondary | ICD-10-CM | POA: Diagnosis not present

## 2016-02-06 DIAGNOSIS — N186 End stage renal disease: Secondary | ICD-10-CM | POA: Diagnosis not present

## 2016-02-08 DIAGNOSIS — N186 End stage renal disease: Secondary | ICD-10-CM | POA: Diagnosis not present

## 2016-02-08 DIAGNOSIS — N2581 Secondary hyperparathyroidism of renal origin: Secondary | ICD-10-CM | POA: Diagnosis not present

## 2016-02-08 DIAGNOSIS — D631 Anemia in chronic kidney disease: Secondary | ICD-10-CM | POA: Diagnosis not present

## 2016-02-08 DIAGNOSIS — D509 Iron deficiency anemia, unspecified: Secondary | ICD-10-CM | POA: Diagnosis not present

## 2016-02-08 DIAGNOSIS — Z992 Dependence on renal dialysis: Secondary | ICD-10-CM | POA: Diagnosis not present

## 2016-02-11 DIAGNOSIS — D631 Anemia in chronic kidney disease: Secondary | ICD-10-CM | POA: Diagnosis not present

## 2016-02-11 DIAGNOSIS — N2581 Secondary hyperparathyroidism of renal origin: Secondary | ICD-10-CM | POA: Diagnosis not present

## 2016-02-11 DIAGNOSIS — D509 Iron deficiency anemia, unspecified: Secondary | ICD-10-CM | POA: Diagnosis not present

## 2016-02-11 DIAGNOSIS — N186 End stage renal disease: Secondary | ICD-10-CM | POA: Diagnosis not present

## 2016-02-11 DIAGNOSIS — Z992 Dependence on renal dialysis: Secondary | ICD-10-CM | POA: Diagnosis not present

## 2016-02-13 DIAGNOSIS — N186 End stage renal disease: Secondary | ICD-10-CM | POA: Diagnosis not present

## 2016-02-13 DIAGNOSIS — Z992 Dependence on renal dialysis: Secondary | ICD-10-CM | POA: Diagnosis not present

## 2016-02-13 DIAGNOSIS — N2581 Secondary hyperparathyroidism of renal origin: Secondary | ICD-10-CM | POA: Diagnosis not present

## 2016-02-13 DIAGNOSIS — D631 Anemia in chronic kidney disease: Secondary | ICD-10-CM | POA: Diagnosis not present

## 2016-02-13 DIAGNOSIS — D509 Iron deficiency anemia, unspecified: Secondary | ICD-10-CM | POA: Diagnosis not present

## 2016-02-14 DIAGNOSIS — N186 End stage renal disease: Secondary | ICD-10-CM | POA: Diagnosis not present

## 2016-02-14 DIAGNOSIS — Z992 Dependence on renal dialysis: Secondary | ICD-10-CM | POA: Diagnosis not present

## 2016-02-15 DIAGNOSIS — D509 Iron deficiency anemia, unspecified: Secondary | ICD-10-CM | POA: Diagnosis not present

## 2016-02-15 DIAGNOSIS — N186 End stage renal disease: Secondary | ICD-10-CM | POA: Diagnosis not present

## 2016-02-15 DIAGNOSIS — Z992 Dependence on renal dialysis: Secondary | ICD-10-CM | POA: Diagnosis not present

## 2016-02-15 DIAGNOSIS — D631 Anemia in chronic kidney disease: Secondary | ICD-10-CM | POA: Diagnosis not present

## 2016-02-15 DIAGNOSIS — N2581 Secondary hyperparathyroidism of renal origin: Secondary | ICD-10-CM | POA: Diagnosis not present

## 2016-02-18 DIAGNOSIS — D509 Iron deficiency anemia, unspecified: Secondary | ICD-10-CM | POA: Diagnosis not present

## 2016-02-18 DIAGNOSIS — N2581 Secondary hyperparathyroidism of renal origin: Secondary | ICD-10-CM | POA: Diagnosis not present

## 2016-02-18 DIAGNOSIS — D631 Anemia in chronic kidney disease: Secondary | ICD-10-CM | POA: Diagnosis not present

## 2016-02-18 DIAGNOSIS — Z992 Dependence on renal dialysis: Secondary | ICD-10-CM | POA: Diagnosis not present

## 2016-02-18 DIAGNOSIS — N186 End stage renal disease: Secondary | ICD-10-CM | POA: Diagnosis not present

## 2016-02-20 DIAGNOSIS — Z992 Dependence on renal dialysis: Secondary | ICD-10-CM | POA: Diagnosis not present

## 2016-02-20 DIAGNOSIS — D509 Iron deficiency anemia, unspecified: Secondary | ICD-10-CM | POA: Diagnosis not present

## 2016-02-20 DIAGNOSIS — N186 End stage renal disease: Secondary | ICD-10-CM | POA: Diagnosis not present

## 2016-02-20 DIAGNOSIS — N2581 Secondary hyperparathyroidism of renal origin: Secondary | ICD-10-CM | POA: Diagnosis not present

## 2016-02-20 DIAGNOSIS — D631 Anemia in chronic kidney disease: Secondary | ICD-10-CM | POA: Diagnosis not present

## 2016-02-22 DIAGNOSIS — D509 Iron deficiency anemia, unspecified: Secondary | ICD-10-CM | POA: Diagnosis not present

## 2016-02-22 DIAGNOSIS — N2581 Secondary hyperparathyroidism of renal origin: Secondary | ICD-10-CM | POA: Diagnosis not present

## 2016-02-22 DIAGNOSIS — D631 Anemia in chronic kidney disease: Secondary | ICD-10-CM | POA: Diagnosis not present

## 2016-02-22 DIAGNOSIS — N186 End stage renal disease: Secondary | ICD-10-CM | POA: Diagnosis not present

## 2016-02-22 DIAGNOSIS — Z992 Dependence on renal dialysis: Secondary | ICD-10-CM | POA: Diagnosis not present

## 2016-02-25 DIAGNOSIS — D509 Iron deficiency anemia, unspecified: Secondary | ICD-10-CM | POA: Diagnosis not present

## 2016-02-25 DIAGNOSIS — N186 End stage renal disease: Secondary | ICD-10-CM | POA: Diagnosis not present

## 2016-02-25 DIAGNOSIS — Z992 Dependence on renal dialysis: Secondary | ICD-10-CM | POA: Diagnosis not present

## 2016-02-25 DIAGNOSIS — N2581 Secondary hyperparathyroidism of renal origin: Secondary | ICD-10-CM | POA: Diagnosis not present

## 2016-02-25 DIAGNOSIS — D631 Anemia in chronic kidney disease: Secondary | ICD-10-CM | POA: Diagnosis not present

## 2016-02-27 DIAGNOSIS — Z992 Dependence on renal dialysis: Secondary | ICD-10-CM | POA: Diagnosis not present

## 2016-02-27 DIAGNOSIS — D509 Iron deficiency anemia, unspecified: Secondary | ICD-10-CM | POA: Diagnosis not present

## 2016-02-27 DIAGNOSIS — N2581 Secondary hyperparathyroidism of renal origin: Secondary | ICD-10-CM | POA: Diagnosis not present

## 2016-02-27 DIAGNOSIS — D631 Anemia in chronic kidney disease: Secondary | ICD-10-CM | POA: Diagnosis not present

## 2016-02-27 DIAGNOSIS — N186 End stage renal disease: Secondary | ICD-10-CM | POA: Diagnosis not present

## 2016-02-29 DIAGNOSIS — D509 Iron deficiency anemia, unspecified: Secondary | ICD-10-CM | POA: Diagnosis not present

## 2016-02-29 DIAGNOSIS — N2581 Secondary hyperparathyroidism of renal origin: Secondary | ICD-10-CM | POA: Diagnosis not present

## 2016-02-29 DIAGNOSIS — Z992 Dependence on renal dialysis: Secondary | ICD-10-CM | POA: Diagnosis not present

## 2016-02-29 DIAGNOSIS — D631 Anemia in chronic kidney disease: Secondary | ICD-10-CM | POA: Diagnosis not present

## 2016-02-29 DIAGNOSIS — N186 End stage renal disease: Secondary | ICD-10-CM | POA: Diagnosis not present

## 2016-03-03 DIAGNOSIS — N2581 Secondary hyperparathyroidism of renal origin: Secondary | ICD-10-CM | POA: Diagnosis not present

## 2016-03-03 DIAGNOSIS — D631 Anemia in chronic kidney disease: Secondary | ICD-10-CM | POA: Diagnosis not present

## 2016-03-03 DIAGNOSIS — N186 End stage renal disease: Secondary | ICD-10-CM | POA: Diagnosis not present

## 2016-03-03 DIAGNOSIS — Z992 Dependence on renal dialysis: Secondary | ICD-10-CM | POA: Diagnosis not present

## 2016-03-03 DIAGNOSIS — D509 Iron deficiency anemia, unspecified: Secondary | ICD-10-CM | POA: Diagnosis not present

## 2016-03-05 DIAGNOSIS — N186 End stage renal disease: Secondary | ICD-10-CM | POA: Diagnosis not present

## 2016-03-05 DIAGNOSIS — D631 Anemia in chronic kidney disease: Secondary | ICD-10-CM | POA: Diagnosis not present

## 2016-03-05 DIAGNOSIS — N2581 Secondary hyperparathyroidism of renal origin: Secondary | ICD-10-CM | POA: Diagnosis not present

## 2016-03-05 DIAGNOSIS — D509 Iron deficiency anemia, unspecified: Secondary | ICD-10-CM | POA: Diagnosis not present

## 2016-03-05 DIAGNOSIS — Z992 Dependence on renal dialysis: Secondary | ICD-10-CM | POA: Diagnosis not present

## 2016-03-07 DIAGNOSIS — Z992 Dependence on renal dialysis: Secondary | ICD-10-CM | POA: Diagnosis not present

## 2016-03-07 DIAGNOSIS — D509 Iron deficiency anemia, unspecified: Secondary | ICD-10-CM | POA: Diagnosis not present

## 2016-03-07 DIAGNOSIS — N2581 Secondary hyperparathyroidism of renal origin: Secondary | ICD-10-CM | POA: Diagnosis not present

## 2016-03-07 DIAGNOSIS — D631 Anemia in chronic kidney disease: Secondary | ICD-10-CM | POA: Diagnosis not present

## 2016-03-07 DIAGNOSIS — N186 End stage renal disease: Secondary | ICD-10-CM | POA: Diagnosis not present

## 2016-03-11 DIAGNOSIS — N2581 Secondary hyperparathyroidism of renal origin: Secondary | ICD-10-CM | POA: Diagnosis not present

## 2016-03-11 DIAGNOSIS — Z992 Dependence on renal dialysis: Secondary | ICD-10-CM | POA: Diagnosis not present

## 2016-03-11 DIAGNOSIS — N186 End stage renal disease: Secondary | ICD-10-CM | POA: Diagnosis not present

## 2016-03-11 DIAGNOSIS — D631 Anemia in chronic kidney disease: Secondary | ICD-10-CM | POA: Diagnosis not present

## 2016-03-11 DIAGNOSIS — D509 Iron deficiency anemia, unspecified: Secondary | ICD-10-CM | POA: Diagnosis not present

## 2016-03-12 DIAGNOSIS — N2581 Secondary hyperparathyroidism of renal origin: Secondary | ICD-10-CM | POA: Diagnosis not present

## 2016-03-12 DIAGNOSIS — N186 End stage renal disease: Secondary | ICD-10-CM | POA: Diagnosis not present

## 2016-03-12 DIAGNOSIS — D631 Anemia in chronic kidney disease: Secondary | ICD-10-CM | POA: Diagnosis not present

## 2016-03-12 DIAGNOSIS — Z992 Dependence on renal dialysis: Secondary | ICD-10-CM | POA: Diagnosis not present

## 2016-03-12 DIAGNOSIS — D509 Iron deficiency anemia, unspecified: Secondary | ICD-10-CM | POA: Diagnosis not present

## 2016-03-14 DIAGNOSIS — N2581 Secondary hyperparathyroidism of renal origin: Secondary | ICD-10-CM | POA: Diagnosis not present

## 2016-03-14 DIAGNOSIS — D509 Iron deficiency anemia, unspecified: Secondary | ICD-10-CM | POA: Diagnosis not present

## 2016-03-14 DIAGNOSIS — D631 Anemia in chronic kidney disease: Secondary | ICD-10-CM | POA: Diagnosis not present

## 2016-03-14 DIAGNOSIS — Z992 Dependence on renal dialysis: Secondary | ICD-10-CM | POA: Diagnosis not present

## 2016-03-14 DIAGNOSIS — N186 End stage renal disease: Secondary | ICD-10-CM | POA: Diagnosis not present

## 2016-03-16 DIAGNOSIS — Z992 Dependence on renal dialysis: Secondary | ICD-10-CM | POA: Diagnosis not present

## 2016-03-16 DIAGNOSIS — N186 End stage renal disease: Secondary | ICD-10-CM | POA: Diagnosis not present

## 2016-03-17 DIAGNOSIS — N2581 Secondary hyperparathyroidism of renal origin: Secondary | ICD-10-CM | POA: Diagnosis not present

## 2016-03-17 DIAGNOSIS — D509 Iron deficiency anemia, unspecified: Secondary | ICD-10-CM | POA: Diagnosis not present

## 2016-03-17 DIAGNOSIS — Z992 Dependence on renal dialysis: Secondary | ICD-10-CM | POA: Diagnosis not present

## 2016-03-17 DIAGNOSIS — N186 End stage renal disease: Secondary | ICD-10-CM | POA: Diagnosis not present

## 2016-03-17 DIAGNOSIS — D631 Anemia in chronic kidney disease: Secondary | ICD-10-CM | POA: Diagnosis not present

## 2016-03-19 DIAGNOSIS — Z992 Dependence on renal dialysis: Secondary | ICD-10-CM | POA: Diagnosis not present

## 2016-03-19 DIAGNOSIS — D631 Anemia in chronic kidney disease: Secondary | ICD-10-CM | POA: Diagnosis not present

## 2016-03-19 DIAGNOSIS — N186 End stage renal disease: Secondary | ICD-10-CM | POA: Diagnosis not present

## 2016-03-19 DIAGNOSIS — D509 Iron deficiency anemia, unspecified: Secondary | ICD-10-CM | POA: Diagnosis not present

## 2016-03-19 DIAGNOSIS — N2581 Secondary hyperparathyroidism of renal origin: Secondary | ICD-10-CM | POA: Diagnosis not present

## 2016-03-20 ENCOUNTER — Ambulatory Visit: Payer: Medicare Other | Admitting: Orthopaedic Surgery

## 2016-03-21 DIAGNOSIS — Z992 Dependence on renal dialysis: Secondary | ICD-10-CM | POA: Diagnosis not present

## 2016-03-21 DIAGNOSIS — N186 End stage renal disease: Secondary | ICD-10-CM | POA: Diagnosis not present

## 2016-03-21 DIAGNOSIS — D631 Anemia in chronic kidney disease: Secondary | ICD-10-CM | POA: Diagnosis not present

## 2016-03-21 DIAGNOSIS — N2581 Secondary hyperparathyroidism of renal origin: Secondary | ICD-10-CM | POA: Diagnosis not present

## 2016-03-21 DIAGNOSIS — D509 Iron deficiency anemia, unspecified: Secondary | ICD-10-CM | POA: Diagnosis not present

## 2016-03-24 DIAGNOSIS — E119 Type 2 diabetes mellitus without complications: Secondary | ICD-10-CM | POA: Diagnosis not present

## 2016-03-24 DIAGNOSIS — N186 End stage renal disease: Secondary | ICD-10-CM | POA: Diagnosis not present

## 2016-03-24 DIAGNOSIS — D631 Anemia in chronic kidney disease: Secondary | ICD-10-CM | POA: Diagnosis not present

## 2016-03-24 DIAGNOSIS — N2581 Secondary hyperparathyroidism of renal origin: Secondary | ICD-10-CM | POA: Diagnosis not present

## 2016-03-24 DIAGNOSIS — D509 Iron deficiency anemia, unspecified: Secondary | ICD-10-CM | POA: Diagnosis not present

## 2016-03-24 DIAGNOSIS — Z992 Dependence on renal dialysis: Secondary | ICD-10-CM | POA: Diagnosis not present

## 2016-03-25 ENCOUNTER — Ambulatory Visit (INDEPENDENT_AMBULATORY_CARE_PROVIDER_SITE_OTHER): Payer: Medicare Other | Admitting: Orthopaedic Surgery

## 2016-03-25 ENCOUNTER — Encounter: Payer: Self-pay | Admitting: Orthopaedic Surgery

## 2016-03-25 ENCOUNTER — Ambulatory Visit (INDEPENDENT_AMBULATORY_CARE_PROVIDER_SITE_OTHER): Payer: Medicare Other

## 2016-03-25 VITALS — BP 102/68 | HR 91 | Temp 97.5°F | Ht 64.0 in | Wt 98.6 lb

## 2016-03-25 DIAGNOSIS — M25562 Pain in left knee: Secondary | ICD-10-CM

## 2016-03-25 DIAGNOSIS — L930 Discoid lupus erythematosus: Secondary | ICD-10-CM | POA: Diagnosis not present

## 2016-03-25 DIAGNOSIS — G8929 Other chronic pain: Secondary | ICD-10-CM

## 2016-03-25 DIAGNOSIS — M6702 Short Achilles tendon (acquired), left ankle: Secondary | ICD-10-CM | POA: Diagnosis not present

## 2016-03-25 NOTE — Progress Notes (Signed)
Subjective:    Patient ID: [name], female    DOB: 1975/12/29, 41 y.o.   MRN: [id number]  HPI She has left knee pain for many months to over a year.  She has no giving way, no locking just diffuse pain of the left knee.  She has no swelling.  She is on dialysis and has lupus.  She has no redness to the knee. She has no pain of the right knee.  She has a very tight heel cord on the left and walks in a tip-toe type of gait.  Nothing seems to have helped her knee, not rest, ice or Advil.   Review of Systems  HENT: Negative for congestion.   Respiratory: Negative for cough and shortness of breath.   Cardiovascular: Negative for chest pain and leg swelling.  Endocrine: Positive for cold intolerance.  Musculoskeletal: Positive for arthralgias, gait problem and myalgias.  Allergic/Immunologic: Positive for environmental allergies.  Psychiatric/Behavioral: The patient is nervous/anxious.    Past Medical History:  Diagnosis Date  . Anemia   . BV (bacterial vaginosis) 11/08/2013  . Depression   . Dialysis patient ([place])   . DVT (deep venous thrombosis) ([place])   . Hypertension   . Lupus   . Renal disorder   . Vaginal odor 11/08/2013    Past Surgical History:  Procedure Laterality Date  . AV FISTULA PLACEMENT    . PERIPHERAL VASCULAR CATHETERIZATION Left 01/04/2015   Procedure: A/V Shuntogram/Fistulagram;  Surgeon: Algernon Huxley, MD;  Location: [facility];  Service: Cardiovascular;  Laterality: Left;  . PERIPHERAL VASCULAR CATHETERIZATION N/A 01/04/2015   Procedure: A/V Shunt Intervention;  Surgeon: Algernon Huxley, MD;  Location: [place] [facility];  Service: Cardiovascular;  Laterality: N/A;  . PERIPHERAL VASCULAR CATHETERIZATION Left 04/13/2015   Procedure: A/V Shuntogram/Fistulagram;  Surgeon: Katha Cabal, MD;  Location: [place] [facility];  Service: Cardiovascular;  Laterality: Left;  . PERIPHERAL VASCULAR CATHETERIZATION Left 04/13/2015   Procedure: A/V Shunt  Intervention;  Surgeon: Katha Cabal, MD;  Location: [place] [facility];  Service: Cardiovascular;  Laterality: Left;    Current Outpatient Prescriptions on File Prior to Visit  Medication Sig Dispense Refill  . ALPRAZolam (XANAX) 0.25 MG tablet Take 0.25 mg by mouth 2 (two) times daily as needed for anxiety.     Marland Kitchen aspirin EC 81 MG tablet Take 81 mg by mouth daily.    Marland Kitchen docusate sodium (COLACE) 100 MG capsule Take 100 mg by mouth daily.    Marland Kitchen epoetin alfa (EPOGEN,PROCRIT) 95638 UNIT/ML injection Inject 4,000 Units into the skin 3 (three) times a week. Mondays, Wednesdays, Fridays    . HYDROcodone-acetaminophen (NORCO/VICODIN) 5-325 MG per tablet Take 2 tablets by mouth every 4 (four) hours as needed. 10 tablet 0  . loratadine (CLARITIN) 10 MG tablet Take 10 mg by mouth daily as needed for allergies.    . midodrine (PROAMATINE) 10 MG tablet Take 10 mg by mouth 2 (two) times daily. Monday, Wednesday, and Friday before dialysis    . multivitamin (RENA-VIT) TABS tablet Take 1 tablet by mouth daily.    . pantoprazole (PROTONIX) 40 MG tablet Take 40 mg by mouth daily.    . phenytoin (DILANTIN) 100 MG ER capsule Take 200 mg by mouth 2 (two) times daily.     . polyethylene glycol (MIRALAX / GLYCOLAX) packet Take 17 g by mouth daily as needed for mild constipation.    Marland Kitchen albuterol (PROVENTIL) (2.5 MG/3ML) 0.083% nebulizer  solution Take 2.5 mg by nebulization 4 (four) times daily as needed (breathing).    . chlorhexidine (PERIDEX) 0.12 % solution Use as directed 10 mLs in the mouth or throat 2 (two) times daily.    . clopidogrel (PLAVIX) 75 MG tablet Take 75 mg by mouth daily.    Marland Kitchen guaiFENesin (ROBITUSSIN) 100 MG/5ML liquid Take 200 mg by mouth 3 (three) times daily as needed for cough.    . hydroxychloroquine (PLAQUENIL) 200 MG tablet Take 200 mg by mouth daily.    Marland Kitchen ipratropium (ATROVENT) 0.02 % nebulizer solution Take 250 mcg by nebulization 4 (four) times daily as needed (breathing).    Marland Kitchen  lidocaine-prilocaine (EMLA) cream Apply 1 application topically as needed (for port access).     . metroNIDAZOLE (FLAGYL) 500 MG tablet Take 1 tablet (500 mg total) by mouth 2 (two) times daily. (Patient not taking: Reported on 03/25/2016) 14 tablet 0  . mirtazapine (REMERON) 15 MG tablet 15 mg at bedtime.     . ondansetron (ZOFRAN) 8 MG tablet Take by mouth as needed for nausea or vomiting.    . sertraline (ZOLOFT) 100 MG tablet Take 200 mg by mouth 2 (two) times daily.     . sevelamer carbonate (RENVELA) 800 MG tablet Take 800-1,600 mg by mouth 4 (four) times daily. Takes 1600 mg with each meal and 800 mg with snack    . simvastatin (ZOCOR) 40 MG tablet Take 40 mg by mouth daily.     Current Facility-Administered Medications on File Prior to Visit  Medication Dose Route Frequency Provider Last Rate Last Dose  . 0.9 %  sodium chloride infusion   Intravenous Once Monia Sabal, PA-C      . ceFAZolin (ANCEF) IVPB 2 g/50 mL premix  2 g Intravenous Once Monia Sabal, PA-C        Social History   Social History  . Marital status: Single    Spouse name: N/A  . Number of children: N/A  . Years of education: N/A   Occupational History  . Not on file.   Social History Main Topics  . Smoking status: Current Every Day Smoker    Packs/day: 1.00    Years: 15.00    Types: Cigarettes  . Smokeless tobacco: Never Used     Comment: smokes 2 cig daily  . Alcohol use No  . Drug use: No  . Sexual activity: No   Other Topics Concern  . Not on file   Social History Narrative  . No narrative on file    Family History  Problem Relation Age of Onset  . Seizures Son   . Kidney disease Mother     on dialysis  . Lupus Mother     BP 102/68   Pulse 91   Temp 97.5 F (36.4 C)   Ht 5\' 4"  (1.626 m)   Wt 98 lb 9.6 oz (44.7 kg)   BMI 16.92 kg/m      Objective:   Physical Exam  Constitutional: She is oriented to person, place, and time. She appears well-developed and well-nourished.  HENT:   Head: Normocephalic and atraumatic.  Eyes: Conjunctivae and EOM are normal. Pupils are equal, round, and reactive to light.  Neck: Normal range of motion. Neck supple.  Cardiovascular: Normal rate, regular rhythm and intact distal pulses.   Pulmonary/Chest: Effort normal.  Abdominal: Soft.  Musculoskeletal: She exhibits tenderness (Left knee with full ROM, no effusion, stable, no redness.  Diffuse pain.).  Very tight heel  cord left and tip-toe type gate.  Large callus over the third metatarsal head with loss of metatarsal arch.   Neurological: She is alert and oriented to person, place, and time. She displays normal reflexes. No cranial nerve deficit. She exhibits normal muscle tone. Coordination normal.  Skin: Skin is warm and dry.  Psychiatric: She has a normal mood and affect. Her behavior is normal. Judgment and thought content normal.    X-rays were done of the left knee, reported separately.      Assessment & Plan:   Encounter Diagnoses  Name Primary?  . Chronic pain of left knee Yes  . Arthralgia of left lower leg   . Lupus erythematosus, unspecified form   . Tightness of left heel cord    PROCEDURE NOTE:  The patient requests injections of the left knee , verbal consent was obtained.  The left knee was prepped appropriately after time out was performed.   Sterile technique was observed and injection of 1 cc of Depo-Medrol 40 mg with several cc's of plain xylocaine. Anesthesia was provided by ethyl chloride and a 20-gauge needle was used to inject the knee area. The injection was tolerated well.  A band aid dressing was applied.  The patient was advised to apply ice later today and tomorrow to the injection sight as needed.  I will see her in one month.  Begin PT for the knee and tight heel cord.  Call if any problem.  Electronically Signed Sanjuana Kava, MD 1/9/201811:25 AM

## 2016-03-26 DIAGNOSIS — N2581 Secondary hyperparathyroidism of renal origin: Secondary | ICD-10-CM | POA: Diagnosis not present

## 2016-03-26 DIAGNOSIS — Z992 Dependence on renal dialysis: Secondary | ICD-10-CM | POA: Diagnosis not present

## 2016-03-26 DIAGNOSIS — D509 Iron deficiency anemia, unspecified: Secondary | ICD-10-CM | POA: Diagnosis not present

## 2016-03-26 DIAGNOSIS — D631 Anemia in chronic kidney disease: Secondary | ICD-10-CM | POA: Diagnosis not present

## 2016-03-26 DIAGNOSIS — N186 End stage renal disease: Secondary | ICD-10-CM | POA: Diagnosis not present

## 2016-03-28 DIAGNOSIS — N2581 Secondary hyperparathyroidism of renal origin: Secondary | ICD-10-CM | POA: Diagnosis not present

## 2016-03-28 DIAGNOSIS — Z992 Dependence on renal dialysis: Secondary | ICD-10-CM | POA: Diagnosis not present

## 2016-03-28 DIAGNOSIS — D631 Anemia in chronic kidney disease: Secondary | ICD-10-CM | POA: Diagnosis not present

## 2016-03-28 DIAGNOSIS — N186 End stage renal disease: Secondary | ICD-10-CM | POA: Diagnosis not present

## 2016-03-28 DIAGNOSIS — D509 Iron deficiency anemia, unspecified: Secondary | ICD-10-CM | POA: Diagnosis not present

## 2016-03-31 ENCOUNTER — Other Ambulatory Visit (INDEPENDENT_AMBULATORY_CARE_PROVIDER_SITE_OTHER): Payer: Self-pay | Admitting: Vascular Surgery

## 2016-03-31 DIAGNOSIS — Z992 Dependence on renal dialysis: Secondary | ICD-10-CM | POA: Diagnosis not present

## 2016-03-31 DIAGNOSIS — T829XXA Unspecified complication of cardiac and vascular prosthetic device, implant and graft, initial encounter: Secondary | ICD-10-CM

## 2016-03-31 DIAGNOSIS — D631 Anemia in chronic kidney disease: Secondary | ICD-10-CM | POA: Diagnosis not present

## 2016-03-31 DIAGNOSIS — N186 End stage renal disease: Secondary | ICD-10-CM

## 2016-03-31 DIAGNOSIS — D509 Iron deficiency anemia, unspecified: Secondary | ICD-10-CM | POA: Diagnosis not present

## 2016-03-31 DIAGNOSIS — N2581 Secondary hyperparathyroidism of renal origin: Secondary | ICD-10-CM | POA: Diagnosis not present

## 2016-04-01 ENCOUNTER — Ambulatory Visit (INDEPENDENT_AMBULATORY_CARE_PROVIDER_SITE_OTHER): Payer: Medicare Other

## 2016-04-01 ENCOUNTER — Ambulatory Visit (INDEPENDENT_AMBULATORY_CARE_PROVIDER_SITE_OTHER): Payer: Medicare Other | Admitting: Vascular Surgery

## 2016-04-01 ENCOUNTER — Encounter (INDEPENDENT_AMBULATORY_CARE_PROVIDER_SITE_OTHER): Payer: Self-pay | Admitting: Vascular Surgery

## 2016-04-01 VITALS — BP 109/71 | HR 88 | Resp 16 | Wt 96.0 lb

## 2016-04-01 DIAGNOSIS — N186 End stage renal disease: Secondary | ICD-10-CM

## 2016-04-01 DIAGNOSIS — F71 Moderate intellectual disabilities: Secondary | ICD-10-CM

## 2016-04-01 DIAGNOSIS — M62461 Contracture of muscle, right lower leg: Secondary | ICD-10-CM | POA: Diagnosis not present

## 2016-04-01 DIAGNOSIS — Z992 Dependence on renal dialysis: Secondary | ICD-10-CM | POA: Diagnosis not present

## 2016-04-01 DIAGNOSIS — M62462 Contracture of muscle, left lower leg: Secondary | ICD-10-CM | POA: Diagnosis not present

## 2016-04-01 DIAGNOSIS — T829XXA Unspecified complication of cardiac and vascular prosthetic device, implant and graft, initial encounter: Secondary | ICD-10-CM

## 2016-04-01 DIAGNOSIS — M25561 Pain in right knee: Secondary | ICD-10-CM | POA: Diagnosis not present

## 2016-04-01 DIAGNOSIS — I10 Essential (primary) hypertension: Secondary | ICD-10-CM

## 2016-04-01 DIAGNOSIS — M25673 Stiffness of unspecified ankle, not elsewhere classified: Secondary | ICD-10-CM | POA: Diagnosis not present

## 2016-04-01 NOTE — Progress Notes (Signed)
MRN : [id number]  [name] is a 41 y.o. ([birth year]) female who presents with chief complaint of  Chief Complaint  Patient presents with  . Follow-up  .  History of Present Illness: Patient returns in follow-up of her dialysis access. She apparently had dialysis yesterday without an obvious event. She is a very poor historian and does not know if her blood pressure dropped after dialysis. On duplex today, her left upper arm dialysis graft is occluded. She denies fevers or chills. She denies any symptoms of volume overload. She denies chest pain or shortness of breath.  Current Outpatient Prescriptions  Medication Sig Dispense Refill  . albuterol (PROVENTIL) (2.5 MG/3ML) 0.083% nebulizer solution Take 2.5 mg by nebulization 4 (four) times daily as needed (breathing).    . ALPRAZolam (XANAX) 0.25 MG tablet Take 0.25 mg by mouth 2 (two) times daily as needed for anxiety.     Marland Kitchen aspirin EC 81 MG tablet Take 81 mg by mouth daily.    . chlorhexidine (PERIDEX) 0.12 % solution Use as directed 10 mLs in the mouth or throat 2 (two) times daily.    . clopidogrel (PLAVIX) 75 MG tablet Take 75 mg by mouth daily.    Marland Kitchen docusate sodium (COLACE) 100 MG capsule Take 100 mg by mouth daily.    Marland Kitchen doxercalciferol (HECTOROL) 4 MCG/2ML injection Inject into the vein.    Marland Kitchen epoetin alfa (EPOGEN,PROCRIT) 38182 UNIT/ML injection Inject 4,000 Units into the skin 3 (three) times a week. Mondays, Wednesdays, Fridays    . guaiFENesin (ROBITUSSIN) 100 MG/5ML liquid Take 200 mg by mouth 3 (three) times daily as needed for cough.    Marland Kitchen HYDROcodone-acetaminophen (NORCO/VICODIN) 5-325 MG per tablet Take 2 tablets by mouth every 4 (four) hours as needed. 10 tablet 0  . hydroxychloroquine (PLAQUENIL) 200 MG tablet Take 200 mg by mouth daily.    Marland Kitchen ipratropium (ATROVENT) 0.02 % nebulizer solution Take 250 mcg by nebulization 4 (four) times daily as needed (breathing).    Marland Kitchen lidocaine-prilocaine (EMLA) cream Apply 1  application topically as needed (for port access).     Marland Kitchen loratadine (CLARITIN) 10 MG tablet Take 10 mg by mouth daily as needed for allergies.    . metroNIDAZOLE (FLAGYL) 500 MG tablet Take 1 tablet (500 mg total) by mouth 2 (two) times daily. 14 tablet 0  . midodrine (PROAMATINE) 10 MG tablet Take 10 mg by mouth 2 (two) times daily. Monday, Wednesday, and Friday before dialysis    . mirtazapine (REMERON) 15 MG tablet 15 mg at bedtime.     . multivitamin (RENA-VIT) TABS tablet Take 1 tablet by mouth daily.    . ondansetron (ZOFRAN) 8 MG tablet Take by mouth as needed for nausea or vomiting.    . pantoprazole (PROTONIX) 40 MG tablet Take 40 mg by mouth daily.    . phenytoin (DILANTIN) 100 MG ER capsule Take 200 mg by mouth 2 (two) times daily.     . polyethylene glycol (MIRALAX / GLYCOLAX) packet Take 17 g by mouth daily as needed for mild constipation.    . sertraline (ZOLOFT) 100 MG tablet Take 200 mg by mouth 2 (two) times daily.     . sevelamer carbonate (RENVELA) 800 MG tablet Take 800-1,600 mg by mouth 4 (four) times daily. Takes 1600 mg with each meal and 800 mg with snack    . simvastatin (ZOCOR) 40 MG tablet Take 40 mg by mouth daily.     No current facility-administered medications for  this visit.    Facility-Administered Medications Ordered in Other Visits  Medication Dose Route Frequency Provider Last Rate Last Dose  . 0.9 %  sodium chloride infusion   Intravenous Once Monia Sabal, PA-C      . ceFAZolin (ANCEF) IVPB 2 g/50 mL premix  2 g Intravenous Once Monia Sabal, PA-C        Past Medical History:  Diagnosis Date  . Anemia   . BV (bacterial vaginosis) 11/08/2013  . Depression   . Dialysis patient ([place])   . DVT (deep venous thrombosis) ([place])   . Hypertension   . Lupus   . Renal disorder   . Vaginal odor 11/08/2013    Past Surgical History:  Procedure Laterality Date  . AV FISTULA PLACEMENT    . PERIPHERAL VASCULAR CATHETERIZATION Left 01/04/2015   Procedure: A/V  Shuntogram/Fistulagram;  Surgeon: Algernon Huxley, MD;  Location: [facility];  Service: Cardiovascular;  Laterality: Left;  . PERIPHERAL VASCULAR CATHETERIZATION N/A 01/04/2015   Procedure: A/V Shunt Intervention;  Surgeon: Algernon Huxley, MD;  Location: [place] [facility];  Service: Cardiovascular;  Laterality: N/A;  . PERIPHERAL VASCULAR CATHETERIZATION Left 04/13/2015   Procedure: A/V Shuntogram/Fistulagram;  Surgeon: Katha Cabal, MD;  Location: [place];  Service: Cardiovascular;  Laterality: Left;  . PERIPHERAL VASCULAR CATHETERIZATION Left 04/13/2015   Procedure: A/V Shunt Intervention;  Surgeon: Katha Cabal, MD;  Location: [facility] [facility];  Service: Cardiovascular;  Laterality: Left;    Social History Social History  Substance Use Topics  . Smoking status: Current Every Day Smoker    Packs/day: 1.00    Years: 15.00    Types: Cigarettes  . Smokeless tobacco: Never Used     Comment: smokes 2 cig daily  . Alcohol use No  Lives in a group home  Family History Family History  Problem Relation Age of Onset  . Seizures Son   . Kidney disease Mother     on dialysis  . Lupus Mother   No bleeding disorders or clotting disorders  Allergies  Allergen Reactions  . Dust Mite Extract Other (See Comments)    sneezing     REVIEW OF SYSTEMS (Negative unless checked)  Constitutional: [] Weight loss  [] Fever  [] Chills Cardiac: [] Chest pain   [] Chest pressure   [] Palpitations   [] Shortness of breath when laying flat   [] Shortness of breath at rest   [] Shortness of breath with exertion. Vascular:  [] Pain in legs with walking   [] Pain in legs at rest   [] Pain in legs when laying flat   [] Claudication   [] Pain in feet when walking  [] Pain in feet at rest  [] Pain in feet when laying flat   [] History of DVT   [] Phlebitis   [] Swelling in legs   [] Varicose veins   [] Non-healing ulcers Pulmonary:   [] Uses home oxygen   [] Productive cough   [] Hemoptysis   [] Wheeze   [] COPD   [] Asthma Neurologic:  [] Dizziness  [] Blackouts   [x] Seizures   [x] History of stroke   [] History of TIA  [] Aphasia   [] Temporary blindness   [] Dysphagia   [] Weakness or numbness in arms   [] Weakness or numbness in legs Musculoskeletal:  [] Arthritis   [] Joint swelling   [] Joint pain   [] Low back pain Hematologic:  [] Easy bruising  [] Easy bleeding   [] Hypercoagulable state   [] Anemic  [] Hepatitis Gastrointestinal:  [] Blood in stool   [] Vomiting blood  [] Gastroesophageal reflux/heartburn   [] Difficulty swallowing. Genitourinary:  [  x]Chronic kidney disease   [] Difficult urination  [] Frequent urination  [] Burning with urination   [] Blood in urine Skin:  [] Rashes   [] Ulcers   [] Wounds Psychological:  [] History of anxiety   []  History of major depression.  Physical Examination  Vitals:   04/01/16 1353  BP: 109/71  Pulse: 88  Resp: 16  Weight: 96 lb (43.5 kg)   Body mass index is 16.48 kg/m. Gen:  Thin, NAD Head: [place]/AT, No temporalis wasting. Ear/Nose/Throat: Hearing grossly intact, nares w/o erythema or drainage, trachea midline Eyes: Conjunctiva clear. Sclera non-icteric Neck: Supple.  No JVD.  Pulmonary:  Good air movement, equal and clear to auscultation bilaterally.  Cardiac: RRR, normal S1, S2, no Murmurs, rubs or gallops. Vascular: No thrill or bruit is present in the left upper arm dialysis graft Vessel Right Left  Radial Palpable Palpable                                   Gastrointestinal: soft, non-tender/non-distended. No guarding/reflex.  Musculoskeletal: M/S 5/5 throughout.  No deformity or atrophy.  Neurologic: CN 2-12 intact. Sensation grossly intact in extremities.  Symmetrical.  Speech is fluent. Motor exam as listed above. Psychiatric: Poor historian. Insite seems poor. Dermatologic: No rashes or ulcers noted.  No cellulitis or open wounds. Lymph : No Cervical, Axillary, or Inguinal lymphadenopathy.     CBC Lab Results  Component Value Date   WBC  10.8 (H) 06/17/2013   HGB 11.0 (L) 06/17/2013   HCT 31.7 (L) 06/17/2013   MCV 83.2 06/17/2013   PLT 306 06/17/2013    BMET    Component Value Date/Time   NA 139 06/17/2013 1210   NA 137 02/18/2013 0440   K 5.6 (H) 04/13/2015 1221   K 4.9 07/06/2014 1308   CL 95 (L) 06/17/2013 1210   CL 100 02/18/2013 0440   CO2 28 06/17/2013 1210   CO2 33 (H) 02/18/2013 0440   GLUCOSE 84 06/17/2013 1210   GLUCOSE 73 02/18/2013 0440   BUN 33 (H) 06/17/2013 1210   BUN 9 02/18/2013 0440   CREATININE 5.71 (H) 06/17/2013 1210   CREATININE 1.93 (H) 02/18/2013 0440   CALCIUM 9.3 06/17/2013 1210   CALCIUM 8.2 (L) 02/18/2013 0440   CALCIUM (LL) 04/08/2007 1300    6.1 Result repeated and verified. CRITICAL VALUE NOTED.  VALUE IS CONSISTENT WITH PREVIOUSLY REPORTED AND CALLED VALUE. CRITICAL RESULT CALLED TO, READ BACK BY AND VERIFIED WITH: MENIGOSTO,A RN [id number] [place] S.   GFRNONAA 9 (L) 06/17/2013 1210   GFRNONAA 32 (L) 02/18/2013 0440   GFRAA 10 (L) 06/17/2013 1210   GFRAA 37 (L) 02/18/2013 0440   CrCl cannot be calculated (Patient's most recent lab result is older than the maximum 21 days allowed.).  COAG Lab Results  Component Value Date   INR 1.1 10/06/2008   INR 1.6 (H) 02/11/2008   INR 1.3 02/07/2008    Radiology Dg Knee 3 Views Left  Result Date: 03/25/2016 Clinical:  Left knee pain, no trauma.  On dialysis. X-rays were done of the left knee, three views. There is slight medial narrowing of the left knee.  No fracture or loose body is present. Bone quality is good. Impression:  Slight medial narrowing of the left knee, no acute findings. Electronically Signed Sanjuana Kava, MD 1/9/201811:09 AM     Assessment/Plan Essential hypertension blood pressure control important in reducing the progression of atherosclerotic disease. On  appropriate oral medications.   MENTAL RETARDATION, MODERATE stable  Complication of vascular access for dialysis The patient has occlusion of her  left upper arm HeRO graft. This is her dialysis access and we will need to either get this open or she will need a catheter. We will get her scheduled for a attempted declot of the access later this week. Risks and benefits are discussed.  ESRD on dialysis [facility]) The patient has occlusion of her left upper arm HeRO graft. This is her dialysis access and we will need to either get this open or she will need a catheter. We will get her scheduled for a attempted declot of the access later this week. Risks and benefits are discussed.    Leotis Pain, MD  04/01/2016 2:25 PM    This note was created with Dragon medical transcription system.  Any errors from dictation are purely unintentional

## 2016-04-01 NOTE — Assessment & Plan Note (Signed)
stable °

## 2016-04-01 NOTE — Assessment & Plan Note (Signed)
The patient has occlusion of her left upper arm HeRO graft. This is her dialysis access and we will need to either get this open or she will need a catheter. We will get her scheduled for a attempted declot of the access later this week. Risks and benefits are discussed.

## 2016-04-01 NOTE — Assessment & Plan Note (Signed)
blood pressure control important in reducing the progression of atherosclerotic disease. On appropriate oral medications.  

## 2016-04-03 DIAGNOSIS — T82868A Thrombosis of vascular prosthetic devices, implants and grafts, initial encounter: Secondary | ICD-10-CM | POA: Diagnosis not present

## 2016-04-03 DIAGNOSIS — I871 Compression of vein: Secondary | ICD-10-CM | POA: Diagnosis not present

## 2016-04-03 DIAGNOSIS — N186 End stage renal disease: Secondary | ICD-10-CM | POA: Diagnosis not present

## 2016-04-03 DIAGNOSIS — Z992 Dependence on renal dialysis: Secondary | ICD-10-CM | POA: Diagnosis not present

## 2016-04-04 DIAGNOSIS — Z992 Dependence on renal dialysis: Secondary | ICD-10-CM | POA: Diagnosis not present

## 2016-04-04 DIAGNOSIS — N2581 Secondary hyperparathyroidism of renal origin: Secondary | ICD-10-CM | POA: Diagnosis not present

## 2016-04-04 DIAGNOSIS — D509 Iron deficiency anemia, unspecified: Secondary | ICD-10-CM | POA: Diagnosis not present

## 2016-04-04 DIAGNOSIS — D631 Anemia in chronic kidney disease: Secondary | ICD-10-CM | POA: Diagnosis not present

## 2016-04-04 DIAGNOSIS — N186 End stage renal disease: Secondary | ICD-10-CM | POA: Diagnosis not present

## 2016-04-07 DIAGNOSIS — D509 Iron deficiency anemia, unspecified: Secondary | ICD-10-CM | POA: Diagnosis not present

## 2016-04-07 DIAGNOSIS — N2581 Secondary hyperparathyroidism of renal origin: Secondary | ICD-10-CM | POA: Diagnosis not present

## 2016-04-07 DIAGNOSIS — D631 Anemia in chronic kidney disease: Secondary | ICD-10-CM | POA: Diagnosis not present

## 2016-04-07 DIAGNOSIS — Z992 Dependence on renal dialysis: Secondary | ICD-10-CM | POA: Diagnosis not present

## 2016-04-07 DIAGNOSIS — N186 End stage renal disease: Secondary | ICD-10-CM | POA: Diagnosis not present

## 2016-04-09 DIAGNOSIS — N186 End stage renal disease: Secondary | ICD-10-CM | POA: Diagnosis not present

## 2016-04-09 DIAGNOSIS — Z992 Dependence on renal dialysis: Secondary | ICD-10-CM | POA: Diagnosis not present

## 2016-04-09 DIAGNOSIS — D631 Anemia in chronic kidney disease: Secondary | ICD-10-CM | POA: Diagnosis not present

## 2016-04-09 DIAGNOSIS — N2581 Secondary hyperparathyroidism of renal origin: Secondary | ICD-10-CM | POA: Diagnosis not present

## 2016-04-09 DIAGNOSIS — D509 Iron deficiency anemia, unspecified: Secondary | ICD-10-CM | POA: Diagnosis not present

## 2016-04-11 DIAGNOSIS — D631 Anemia in chronic kidney disease: Secondary | ICD-10-CM | POA: Diagnosis not present

## 2016-04-11 DIAGNOSIS — D509 Iron deficiency anemia, unspecified: Secondary | ICD-10-CM | POA: Diagnosis not present

## 2016-04-11 DIAGNOSIS — N186 End stage renal disease: Secondary | ICD-10-CM | POA: Diagnosis not present

## 2016-04-11 DIAGNOSIS — Z992 Dependence on renal dialysis: Secondary | ICD-10-CM | POA: Diagnosis not present

## 2016-04-11 DIAGNOSIS — N2581 Secondary hyperparathyroidism of renal origin: Secondary | ICD-10-CM | POA: Diagnosis not present

## 2016-04-14 DIAGNOSIS — D509 Iron deficiency anemia, unspecified: Secondary | ICD-10-CM | POA: Diagnosis not present

## 2016-04-14 DIAGNOSIS — Z992 Dependence on renal dialysis: Secondary | ICD-10-CM | POA: Diagnosis not present

## 2016-04-14 DIAGNOSIS — N2581 Secondary hyperparathyroidism of renal origin: Secondary | ICD-10-CM | POA: Diagnosis not present

## 2016-04-14 DIAGNOSIS — N186 End stage renal disease: Secondary | ICD-10-CM | POA: Diagnosis not present

## 2016-04-14 DIAGNOSIS — D631 Anemia in chronic kidney disease: Secondary | ICD-10-CM | POA: Diagnosis not present

## 2016-04-15 DIAGNOSIS — M62461 Contracture of muscle, right lower leg: Secondary | ICD-10-CM | POA: Diagnosis not present

## 2016-04-15 DIAGNOSIS — M62462 Contracture of muscle, left lower leg: Secondary | ICD-10-CM | POA: Diagnosis not present

## 2016-04-15 DIAGNOSIS — M25673 Stiffness of unspecified ankle, not elsewhere classified: Secondary | ICD-10-CM | POA: Diagnosis not present

## 2016-04-15 DIAGNOSIS — M25561 Pain in right knee: Secondary | ICD-10-CM | POA: Diagnosis not present

## 2016-04-16 DIAGNOSIS — N186 End stage renal disease: Secondary | ICD-10-CM | POA: Diagnosis not present

## 2016-04-16 DIAGNOSIS — D631 Anemia in chronic kidney disease: Secondary | ICD-10-CM | POA: Diagnosis not present

## 2016-04-16 DIAGNOSIS — N2581 Secondary hyperparathyroidism of renal origin: Secondary | ICD-10-CM | POA: Diagnosis not present

## 2016-04-16 DIAGNOSIS — D509 Iron deficiency anemia, unspecified: Secondary | ICD-10-CM | POA: Diagnosis not present

## 2016-04-16 DIAGNOSIS — Z992 Dependence on renal dialysis: Secondary | ICD-10-CM | POA: Diagnosis not present

## 2016-04-17 DIAGNOSIS — M62462 Contracture of muscle, left lower leg: Secondary | ICD-10-CM | POA: Diagnosis not present

## 2016-04-17 DIAGNOSIS — M25673 Stiffness of unspecified ankle, not elsewhere classified: Secondary | ICD-10-CM | POA: Diagnosis not present

## 2016-04-17 DIAGNOSIS — M25561 Pain in right knee: Secondary | ICD-10-CM | POA: Diagnosis not present

## 2016-04-17 DIAGNOSIS — M62461 Contracture of muscle, right lower leg: Secondary | ICD-10-CM | POA: Diagnosis not present

## 2016-04-18 DIAGNOSIS — D509 Iron deficiency anemia, unspecified: Secondary | ICD-10-CM | POA: Diagnosis not present

## 2016-04-18 DIAGNOSIS — N2581 Secondary hyperparathyroidism of renal origin: Secondary | ICD-10-CM | POA: Diagnosis not present

## 2016-04-18 DIAGNOSIS — N186 End stage renal disease: Secondary | ICD-10-CM | POA: Diagnosis not present

## 2016-04-18 DIAGNOSIS — D631 Anemia in chronic kidney disease: Secondary | ICD-10-CM | POA: Diagnosis not present

## 2016-04-18 DIAGNOSIS — Z992 Dependence on renal dialysis: Secondary | ICD-10-CM | POA: Diagnosis not present

## 2016-04-21 DIAGNOSIS — N186 End stage renal disease: Secondary | ICD-10-CM | POA: Diagnosis not present

## 2016-04-21 DIAGNOSIS — D631 Anemia in chronic kidney disease: Secondary | ICD-10-CM | POA: Diagnosis not present

## 2016-04-21 DIAGNOSIS — N2581 Secondary hyperparathyroidism of renal origin: Secondary | ICD-10-CM | POA: Diagnosis not present

## 2016-04-21 DIAGNOSIS — D509 Iron deficiency anemia, unspecified: Secondary | ICD-10-CM | POA: Diagnosis not present

## 2016-04-21 DIAGNOSIS — Z992 Dependence on renal dialysis: Secondary | ICD-10-CM | POA: Diagnosis not present

## 2016-04-22 ENCOUNTER — Ambulatory Visit (INDEPENDENT_AMBULATORY_CARE_PROVIDER_SITE_OTHER): Payer: Medicare Other | Admitting: Orthopaedic Surgery

## 2016-04-22 VITALS — BP 102/67 | HR 102 | Temp 97.5°F | Ht 64.0 in | Wt 97.0 lb

## 2016-04-22 DIAGNOSIS — M25561 Pain in right knee: Secondary | ICD-10-CM | POA: Diagnosis not present

## 2016-04-22 DIAGNOSIS — M25673 Stiffness of unspecified ankle, not elsewhere classified: Secondary | ICD-10-CM | POA: Diagnosis not present

## 2016-04-22 DIAGNOSIS — M62461 Contracture of muscle, right lower leg: Secondary | ICD-10-CM | POA: Diagnosis not present

## 2016-04-22 DIAGNOSIS — G8929 Other chronic pain: Secondary | ICD-10-CM | POA: Diagnosis not present

## 2016-04-22 DIAGNOSIS — F17210 Nicotine dependence, cigarettes, uncomplicated: Secondary | ICD-10-CM

## 2016-04-22 DIAGNOSIS — M6702 Short Achilles tendon (acquired), left ankle: Secondary | ICD-10-CM | POA: Diagnosis not present

## 2016-04-22 DIAGNOSIS — L930 Discoid lupus erythematosus: Secondary | ICD-10-CM | POA: Diagnosis not present

## 2016-04-22 DIAGNOSIS — M25562 Pain in left knee: Secondary | ICD-10-CM | POA: Diagnosis not present

## 2016-04-22 DIAGNOSIS — M62462 Contracture of muscle, left lower leg: Secondary | ICD-10-CM | POA: Diagnosis not present

## 2016-04-22 NOTE — Patient Instructions (Addendum)
Continue therapy at [facility] in [place].     teps to Quit Smoking Smoking tobacco can be bad for your health. It can also affect almost every organ in your body. Smoking puts you and people around you at risk for many serious [name]-lasting (chronic) diseases. Quitting smoking is hard, but it is one of the best things that you can do for your health. It is never too late to quit. What are the benefits of quitting smoking? When you quit smoking, you lower your risk for getting serious diseases and conditions. They can include:  Lung cancer or lung disease.  Heart disease.  Stroke.  Heart attack.  Not being able to have children (infertility).  Weak bones (osteoporosis) and broken bones (fractures). If you have coughing, wheezing, and shortness of breath, those symptoms may get better when you quit. You may also get sick less often. If you are pregnant, quitting smoking can help to lower your chances of having a baby of low birth weight. What can I do to help me quit smoking? Talk with your doctor about what can help you quit smoking. Some things you can do (strategies) include:  Quitting smoking totally, instead of slowly cutting back how much you smoke over a period of time.  Going to in-person counseling. You are more likely to quit if you go to many counseling sessions.  Using resources and support systems, such as:  Online chats with a Social worker.  Phone quitlines.  Printed Furniture conservator/restorer.  Support groups or group counseling.  Text messaging programs.  Mobile phone apps or applications.  Taking medicines. Some of these medicines may have nicotine in them. If you are pregnant or breastfeeding, do not take any medicines to quit smoking unless your doctor says it is okay. Talk with your doctor about counseling or other things that can help you. Talk with your doctor about using more than one strategy at the same time, such as taking medicines while you are also  going to in-person counseling. This can help make quitting easier. What things can I do to make it easier to quit? Quitting smoking might feel very hard at first, but there is a lot that you can do to make it easier. Take these steps:  Talk to your family and friends. Ask them to support and encourage you.  Call phone quitlines, reach out to support groups, or work with a Social worker.  Ask people who smoke to not smoke around you.  Avoid places that make you want (trigger) to smoke, such as:  Bars.  Parties.  Smoke-break areas at work.  Spend time with people who do not smoke.  Lower the stress in your life. Stress can make you want to smoke. Try these things to help your stress:  Getting regular exercise.  Deep-breathing exercises.  Yoga.  Meditating.  Doing a body scan. To do this, close your eyes, focus on one area of your body at a time from head to toe, and notice which parts of your body are tense. Try to relax the muscles in those areas.  Download or buy apps on your mobile phone or tablet that can help you stick to your quit plan. There are many free apps, such as QuitGuide from the [employer] Office manager for Disease Control and Prevention). You can find more support from smokefree.gov and other websites. This information is not intended to replace advice given to you by your health care provider. Make sure you discuss any questions you have  with your health care provider. Document Released: 12/28/2008 Document Revised: 10/30/2015 Document Reviewed: 07/18/2014 Elsevier Interactive Patient Education  2017 [employer].

## 2016-04-22 NOTE — Progress Notes (Signed)
Patient JW:JXBJYNW [name], female DOB:Aug 18, 1975, 41 y.o. GNF:[id number]  Chief Complaint  Patient presents with  . Follow-up    Right knee pain today. Last time was the left knee.    HPI  [name] is a 41 y.o. female who has right and left knee pain.  She is going to PT and is making slow progress.  She has a long standing tight left heel cord.  She is doing her exercises. She has no new acute problem.  She is on dialysis.  She smokes and is not willing to quit or cut back.   HPI  Body mass index is 16.65 kg/m.  ROS  Review of Systems  HENT: Negative for congestion.   Respiratory: Negative for cough and shortness of breath.   Cardiovascular: Negative for chest pain and leg swelling.  Endocrine: Positive for cold intolerance.  Musculoskeletal: Positive for arthralgias, gait problem and myalgias.  Allergic/Immunologic: Positive for environmental allergies.  Psychiatric/Behavioral: The patient is nervous/anxious.     Past Medical History:  Diagnosis Date  . Anemia   . BV (bacterial vaginosis) 11/08/2013  . Depression   . Dialysis patient ([place])   . DVT (deep venous thrombosis) ([place])   . Hypertension   . Lupus   . Renal disorder   . Vaginal odor 11/08/2013    Past Surgical History:  Procedure Laterality Date  . AV FISTULA PLACEMENT    . PERIPHERAL VASCULAR CATHETERIZATION Left 01/04/2015   Procedure: A/V Shuntogram/Fistulagram;  Surgeon: Algernon Huxley, MD;  Location: [facility];  Service: Cardiovascular;  Laterality: Left;  . PERIPHERAL VASCULAR CATHETERIZATION N/A 01/04/2015   Procedure: A/V Shunt Intervention;  Surgeon: Algernon Huxley, MD;  Location: [facility];  Service: Cardiovascular;  Laterality: N/A;  . PERIPHERAL VASCULAR CATHETERIZATION Left 04/13/2015   Procedure: A/V Shuntogram/Fistulagram;  Surgeon: Katha Cabal, MD;  Location: [facility];  Service: Cardiovascular;  Laterality: Left;  . PERIPHERAL VASCULAR CATHETERIZATION  Left 04/13/2015   Procedure: A/V Shunt Intervention;  Surgeon: Katha Cabal, MD;  Location: [facility];  Service: Cardiovascular;  Laterality: Left;    Family History  Problem Relation Age of Onset  . Seizures Son   . Kidney disease Mother     on dialysis  . Lupus Mother     Social History Social History  Substance Use Topics  . Smoking status: Current Every Day Smoker    Packs/day: 1.00    Years: 15.00    Types: Cigarettes  . Smokeless tobacco: Never Used     Comment: smokes 2 cig daily  . Alcohol use No    Allergies  Allergen Reactions  . Dust Mite Extract Other (See Comments)    sneezing    Current Outpatient Prescriptions  Medication Sig Dispense Refill  . albuterol (PROVENTIL) (2.5 MG/3ML) 0.083% nebulizer solution Take 2.5 mg by nebulization 4 (four) times daily as needed (breathing).    . ALPRAZolam (XANAX) 0.25 MG tablet Take 0.25 mg by mouth 2 (two) times daily as needed for anxiety.     Marland Kitchen aspirin EC 81 MG tablet Take 81 mg by mouth daily.    . chlorhexidine (PERIDEX) 0.12 % solution Use as directed 10 mLs in the mouth or throat 2 (two) times daily.    . clopidogrel (PLAVIX) 75 MG tablet Take 75 mg by mouth daily.    Marland Kitchen docusate sodium (COLACE) 100 MG capsule Take 100 mg by mouth daily.    Marland Kitchen doxercalciferol (HECTOROL) 4  MCG/2ML injection Inject into the vein.    Marland Kitchen epoetin alfa (EPOGEN,PROCRIT) 16109 UNIT/ML injection Inject 4,000 Units into the skin 3 (three) times a week. Mondays, Wednesdays, Fridays    . guaiFENesin (ROBITUSSIN) 100 MG/5ML liquid Take 200 mg by mouth 3 (three) times daily as needed for cough.    Marland Kitchen HYDROcodone-acetaminophen (NORCO/VICODIN) 5-325 MG per tablet Take 2 tablets by mouth every 4 (four) hours as needed. 10 tablet 0  . hydroxychloroquine (PLAQUENIL) 200 MG tablet Take 200 mg by mouth daily.    Marland Kitchen ipratropium (ATROVENT) 0.02 % nebulizer solution Take 250 mcg by nebulization 4 (four) times daily as needed (breathing).    Marland Kitchen  lidocaine-prilocaine (EMLA) cream Apply 1 application topically as needed (for port access).     Marland Kitchen loratadine (CLARITIN) 10 MG tablet Take 10 mg by mouth daily as needed for allergies.    . metroNIDAZOLE (FLAGYL) 500 MG tablet Take 1 tablet (500 mg total) by mouth 2 (two) times daily. 14 tablet 0  . midodrine (PROAMATINE) 10 MG tablet Take 10 mg by mouth 2 (two) times daily. Monday, Wednesday, and Friday before dialysis    . mirtazapine (REMERON) 15 MG tablet 15 mg at bedtime.     . multivitamin (RENA-VIT) TABS tablet Take 1 tablet by mouth daily.    . ondansetron (ZOFRAN) 8 MG tablet Take by mouth as needed for nausea or vomiting.    . pantoprazole (PROTONIX) 40 MG tablet Take 40 mg by mouth daily.    . phenytoin (DILANTIN) 100 MG ER capsule Take 200 mg by mouth 2 (two) times daily.     . polyethylene glycol (MIRALAX / GLYCOLAX) packet Take 17 g by mouth daily as needed for mild constipation.    . sertraline (ZOLOFT) 100 MG tablet Take 200 mg by mouth 2 (two) times daily.     . sevelamer carbonate (RENVELA) 800 MG tablet Take 800-1,600 mg by mouth 4 (four) times daily. Takes 1600 mg with each meal and 800 mg with snack    . simvastatin (ZOCOR) 40 MG tablet Take 40 mg by mouth daily.     No current facility-administered medications for this visit.    Facility-Administered Medications Ordered in Other Visits  Medication Dose Route Frequency Provider Last Rate Last Dose  . 0.9 %  sodium chloride infusion   Intravenous Once Monia Sabal, PA-C      . ceFAZolin (ANCEF) IVPB 2 g/50 mL premix  2 g Intravenous Once Monia Sabal, PA-C         Physical Exam  Blood pressure 102/67, pulse (!) 102, temperature 97.5 F (36.4 C), height 5\' 4"  (1.626 m), weight 97 lb (44 kg).  Constitutional: overall normal hygiene, normal nutrition, well developed, normal grooming, normal body habitus. Assistive device:none  Musculoskeletal: gait and station Limp left, muscle tone and strength are normal, no  tremors or atrophy is present.  .  Neurological: coordination overall normal.  Deep tendon reflex/nerve stretch intact.  Sensation normal.  Cranial nerves II-XII intact.   Skin:   Normal overall no scars, lesions, ulcers or rashes. No psoriasis.  Psychiatric: Alert and oriented x 3.  Recent memory intact, remote memory unclear.  Normal mood and affect. Well groomed.  Good eye contact.  Cardiovascular: overall no swelling, no varicosities, no edema bilaterally, normal temperatures of the legs and arms, no clubbing, cyanosis and good capillary refill.  Lymphatic: palpation is normal.  She has a very tight heel cord left.  The bilateral lower extremity is examined:  Inspection:  Thigh:  Non-tender and no defects  Knee does not have swelling 0  effusion.                        Joint tenderness is present                        Patient is tender over the medial joint line  Lower Leg:  Has normal appearance and no tenderness or defects  Ankle:  Non-tender and no defects  Foot:  Non-tender and no defects Range of Motion:  Knee:  Range of motion is: 0-115 right, 0-105 left                        Crepitus is  present  Ankle:  Range of motion is normal. Strength and Tone:  The bilateral lower extremity has normal strength and tone. Stability:  Knee:  The knee is stable.  Ankle:  The ankle is stable.    The patient has been educated about the nature of the problem(s) and counseled on treatment options.  The patient appeared to understand what I have discussed and is in agreement with it.  Encounter Diagnoses  Name Primary?  . Chronic pain of left knee Yes  . Arthralgia of left lower leg   . Lupus erythematosus, unspecified form   . Tightness of left heel cord   . Cigarette nicotine dependence without complication     PLAN Call if any problems.  Precautions discussed.  Continue current medications.   Return to clinic 3 weeks   Continue PT.  Electronically Signed Sanjuana Kava, MD 2/6/201810:05 AM

## 2016-04-23 DIAGNOSIS — Z992 Dependence on renal dialysis: Secondary | ICD-10-CM | POA: Diagnosis not present

## 2016-04-23 DIAGNOSIS — D631 Anemia in chronic kidney disease: Secondary | ICD-10-CM | POA: Diagnosis not present

## 2016-04-23 DIAGNOSIS — N2581 Secondary hyperparathyroidism of renal origin: Secondary | ICD-10-CM | POA: Diagnosis not present

## 2016-04-23 DIAGNOSIS — D509 Iron deficiency anemia, unspecified: Secondary | ICD-10-CM | POA: Diagnosis not present

## 2016-04-23 DIAGNOSIS — N186 End stage renal disease: Secondary | ICD-10-CM | POA: Diagnosis not present

## 2016-04-25 DIAGNOSIS — Z992 Dependence on renal dialysis: Secondary | ICD-10-CM | POA: Diagnosis not present

## 2016-04-25 DIAGNOSIS — D509 Iron deficiency anemia, unspecified: Secondary | ICD-10-CM | POA: Diagnosis not present

## 2016-04-25 DIAGNOSIS — N2581 Secondary hyperparathyroidism of renal origin: Secondary | ICD-10-CM | POA: Diagnosis not present

## 2016-04-25 DIAGNOSIS — N186 End stage renal disease: Secondary | ICD-10-CM | POA: Diagnosis not present

## 2016-04-25 DIAGNOSIS — D631 Anemia in chronic kidney disease: Secondary | ICD-10-CM | POA: Diagnosis not present

## 2016-04-28 DIAGNOSIS — D631 Anemia in chronic kidney disease: Secondary | ICD-10-CM | POA: Diagnosis not present

## 2016-04-28 DIAGNOSIS — D509 Iron deficiency anemia, unspecified: Secondary | ICD-10-CM | POA: Diagnosis not present

## 2016-04-28 DIAGNOSIS — N186 End stage renal disease: Secondary | ICD-10-CM | POA: Diagnosis not present

## 2016-04-28 DIAGNOSIS — N2581 Secondary hyperparathyroidism of renal origin: Secondary | ICD-10-CM | POA: Diagnosis not present

## 2016-04-28 DIAGNOSIS — Z992 Dependence on renal dialysis: Secondary | ICD-10-CM | POA: Diagnosis not present

## 2016-04-29 DIAGNOSIS — M62462 Contracture of muscle, left lower leg: Secondary | ICD-10-CM | POA: Diagnosis not present

## 2016-04-29 DIAGNOSIS — M62461 Contracture of muscle, right lower leg: Secondary | ICD-10-CM | POA: Diagnosis not present

## 2016-04-29 DIAGNOSIS — M25561 Pain in right knee: Secondary | ICD-10-CM | POA: Diagnosis not present

## 2016-04-29 DIAGNOSIS — M25673 Stiffness of unspecified ankle, not elsewhere classified: Secondary | ICD-10-CM | POA: Diagnosis not present

## 2016-04-30 DIAGNOSIS — N2581 Secondary hyperparathyroidism of renal origin: Secondary | ICD-10-CM | POA: Diagnosis not present

## 2016-04-30 DIAGNOSIS — D631 Anemia in chronic kidney disease: Secondary | ICD-10-CM | POA: Diagnosis not present

## 2016-04-30 DIAGNOSIS — D509 Iron deficiency anemia, unspecified: Secondary | ICD-10-CM | POA: Diagnosis not present

## 2016-04-30 DIAGNOSIS — Z992 Dependence on renal dialysis: Secondary | ICD-10-CM | POA: Diagnosis not present

## 2016-04-30 DIAGNOSIS — N186 End stage renal disease: Secondary | ICD-10-CM | POA: Diagnosis not present

## 2016-05-01 ENCOUNTER — Encounter: Payer: Self-pay | Admitting: Adult Health

## 2016-05-01 ENCOUNTER — Other Ambulatory Visit (HOSPITAL_COMMUNITY)
Admission: RE | Admit: 2016-05-01 | Discharge: 2016-05-01 | Disposition: A | Discharge status: Home / Self Care | Payer: Medicare Other | Source: Ambulatory Visit | Attending: Adult Health | Admitting: Adult Health

## 2016-05-01 ENCOUNTER — Ambulatory Visit (INDEPENDENT_AMBULATORY_CARE_PROVIDER_SITE_OTHER): Payer: Medicare Other | Admitting: Adult Health

## 2016-05-01 VITALS — BP 100/60 | HR 62 | Ht 63.0 in | Wt 95.5 lb

## 2016-05-01 DIAGNOSIS — M25561 Pain in right knee: Secondary | ICD-10-CM | POA: Diagnosis not present

## 2016-05-01 DIAGNOSIS — M62461 Contracture of muscle, right lower leg: Secondary | ICD-10-CM | POA: Diagnosis not present

## 2016-05-01 DIAGNOSIS — Z1151 Encounter for screening for human papillomavirus (HPV): Secondary | ICD-10-CM | POA: Diagnosis not present

## 2016-05-01 DIAGNOSIS — Z1211 Encounter for screening for malignant neoplasm of colon: Secondary | ICD-10-CM | POA: Diagnosis not present

## 2016-05-01 DIAGNOSIS — Z01419 Encounter for gynecological examination (general) (routine) without abnormal findings: Secondary | ICD-10-CM | POA: Insufficient documentation

## 2016-05-01 DIAGNOSIS — Z1212 Encounter for screening for malignant neoplasm of rectum: Secondary | ICD-10-CM

## 2016-05-01 DIAGNOSIS — Z124 Encounter for screening for malignant neoplasm of cervix: Secondary | ICD-10-CM

## 2016-05-01 DIAGNOSIS — M62462 Contracture of muscle, left lower leg: Secondary | ICD-10-CM | POA: Diagnosis not present

## 2016-05-01 DIAGNOSIS — M25673 Stiffness of unspecified ankle, not elsewhere classified: Secondary | ICD-10-CM | POA: Diagnosis not present

## 2016-05-01 LAB — HEMOCCULT GUIAC POC 1CARD (OFFICE): Fecal Occult Blood, POC: NEGATIVE

## 2016-05-01 NOTE — Progress Notes (Signed)
Patient ID: [name], female   DOB: 1975-05-18, 41 y.o.   MRN: [id number] History of Present Illness: [name] is a  41 year old black female in for a well woman gyn exam and pap.She resides at [facility] [address] on [address]. PCP is Dr Luan Pulling.    Current Medications, Allergies, Past Medical History, Past Surgical History, Family History and Social History were reviewed in [employer] record.     Review of Systems: Patient denies any headaches, hearing loss, fatigue, blurred vision, shortness of breath, chest pain, abdominal pain, problems with bowel movements, urination, or intercourse(not having sex). No joint pain or mood swings. She is sleepy, had therapy this morning and dialysis yesterday.   Physical Exam:BP 100/60 (BP Location: Left Arm, Patient Position: Sitting, Cuff Size: Normal)   Pulse 62   Ht 5\' 3"  (1.6 m)   Wt 95 lb 8 oz (43.3 kg)   BMI 16.92 kg/m  General:  Well developed, well nourished, thin, no acute distress Skin:  Warm and dry Neck:  Midline trachea, normal thyroid, good ROM, no lymphadenopathy Lungs; Clear to auscultation bilaterally Breast:  No dominant palpable mass, retraction, or nipple discharge Cardiovascular: Regular rate and rhythm Abdomen:  Soft, non tender, no hepatosplenomegaly Pelvic:  External genitalia is normal in appearance, no lesions.  The vagina is normal in appearance. Urethra has no lesions or masses. The cervix is smooth,pap with HPV performed.  Uterus is felt to be normal size, shape, and contour.  No adnexal masses or tenderness noted.Bladder is non tender, no masses felt. Rectal: Good sphincter tone, no polyps, or hemorrhoids felt.  Hemoccult negative. Extremities/musculoskeletal:  No swelling or varicosities noted, no clubbing or cyanosis Psych:  No mood changes, alert and cooperative,seems happy PHQ 2 score 0.  Impression: 1. Encounter for routine gynecological examination with Papanicolaou smear of cervix    2. Routine cervical smear   3. Screening for colorectal cancer       Plan: Physical in 1 year Pap in 3 if normal Mammogram now and yearly Labs with PCP

## 2016-05-01 NOTE — Patient Instructions (Addendum)
Physical in 1 year Pap in 3 if normal  Mammogram now and yearly 

## 2016-05-02 DIAGNOSIS — N186 End stage renal disease: Secondary | ICD-10-CM | POA: Diagnosis not present

## 2016-05-02 DIAGNOSIS — D509 Iron deficiency anemia, unspecified: Secondary | ICD-10-CM | POA: Diagnosis not present

## 2016-05-02 DIAGNOSIS — D631 Anemia in chronic kidney disease: Secondary | ICD-10-CM | POA: Diagnosis not present

## 2016-05-02 DIAGNOSIS — Z992 Dependence on renal dialysis: Secondary | ICD-10-CM | POA: Diagnosis not present

## 2016-05-02 DIAGNOSIS — N2581 Secondary hyperparathyroidism of renal origin: Secondary | ICD-10-CM | POA: Diagnosis not present

## 2016-05-02 LAB — CYTOLOGY - PAP
Adequacy: ABSENT
DIAGNOSIS: NEGATIVE
HPV: NOT DETECTED

## 2016-05-05 DIAGNOSIS — N186 End stage renal disease: Secondary | ICD-10-CM | POA: Diagnosis not present

## 2016-05-05 DIAGNOSIS — D631 Anemia in chronic kidney disease: Secondary | ICD-10-CM | POA: Diagnosis not present

## 2016-05-05 DIAGNOSIS — Z992 Dependence on renal dialysis: Secondary | ICD-10-CM | POA: Diagnosis not present

## 2016-05-05 DIAGNOSIS — N2581 Secondary hyperparathyroidism of renal origin: Secondary | ICD-10-CM | POA: Diagnosis not present

## 2016-05-05 DIAGNOSIS — D509 Iron deficiency anemia, unspecified: Secondary | ICD-10-CM | POA: Diagnosis not present

## 2016-05-06 DIAGNOSIS — M62461 Contracture of muscle, right lower leg: Secondary | ICD-10-CM | POA: Diagnosis not present

## 2016-05-06 DIAGNOSIS — M62462 Contracture of muscle, left lower leg: Secondary | ICD-10-CM | POA: Diagnosis not present

## 2016-05-06 DIAGNOSIS — M25673 Stiffness of unspecified ankle, not elsewhere classified: Secondary | ICD-10-CM | POA: Diagnosis not present

## 2016-05-06 DIAGNOSIS — M25561 Pain in right knee: Secondary | ICD-10-CM | POA: Diagnosis not present

## 2016-05-07 DIAGNOSIS — Z992 Dependence on renal dialysis: Secondary | ICD-10-CM | POA: Diagnosis not present

## 2016-05-07 DIAGNOSIS — N2581 Secondary hyperparathyroidism of renal origin: Secondary | ICD-10-CM | POA: Diagnosis not present

## 2016-05-07 DIAGNOSIS — N186 End stage renal disease: Secondary | ICD-10-CM | POA: Diagnosis not present

## 2016-05-07 DIAGNOSIS — D509 Iron deficiency anemia, unspecified: Secondary | ICD-10-CM | POA: Diagnosis not present

## 2016-05-07 DIAGNOSIS — D631 Anemia in chronic kidney disease: Secondary | ICD-10-CM | POA: Diagnosis not present

## 2016-05-08 DIAGNOSIS — M25561 Pain in right knee: Secondary | ICD-10-CM | POA: Diagnosis not present

## 2016-05-08 DIAGNOSIS — M25673 Stiffness of unspecified ankle, not elsewhere classified: Secondary | ICD-10-CM | POA: Diagnosis not present

## 2016-05-08 DIAGNOSIS — M62461 Contracture of muscle, right lower leg: Secondary | ICD-10-CM | POA: Diagnosis not present

## 2016-05-08 DIAGNOSIS — M62462 Contracture of muscle, left lower leg: Secondary | ICD-10-CM | POA: Diagnosis not present

## 2016-05-09 DIAGNOSIS — D509 Iron deficiency anemia, unspecified: Secondary | ICD-10-CM | POA: Diagnosis not present

## 2016-05-09 DIAGNOSIS — D631 Anemia in chronic kidney disease: Secondary | ICD-10-CM | POA: Diagnosis not present

## 2016-05-09 DIAGNOSIS — N186 End stage renal disease: Secondary | ICD-10-CM | POA: Diagnosis not present

## 2016-05-09 DIAGNOSIS — Z992 Dependence on renal dialysis: Secondary | ICD-10-CM | POA: Diagnosis not present

## 2016-05-09 DIAGNOSIS — N2581 Secondary hyperparathyroidism of renal origin: Secondary | ICD-10-CM | POA: Diagnosis not present

## 2016-05-12 DIAGNOSIS — D631 Anemia in chronic kidney disease: Secondary | ICD-10-CM | POA: Diagnosis not present

## 2016-05-12 DIAGNOSIS — D509 Iron deficiency anemia, unspecified: Secondary | ICD-10-CM | POA: Diagnosis not present

## 2016-05-12 DIAGNOSIS — Z992 Dependence on renal dialysis: Secondary | ICD-10-CM | POA: Diagnosis not present

## 2016-05-12 DIAGNOSIS — N186 End stage renal disease: Secondary | ICD-10-CM | POA: Diagnosis not present

## 2016-05-12 DIAGNOSIS — N2581 Secondary hyperparathyroidism of renal origin: Secondary | ICD-10-CM | POA: Diagnosis not present

## 2016-05-13 ENCOUNTER — Ambulatory Visit: Payer: Medicare Other | Admitting: Orthopaedic Surgery

## 2016-05-13 DIAGNOSIS — M25673 Stiffness of unspecified ankle, not elsewhere classified: Secondary | ICD-10-CM | POA: Diagnosis not present

## 2016-05-13 DIAGNOSIS — M62461 Contracture of muscle, right lower leg: Secondary | ICD-10-CM | POA: Diagnosis not present

## 2016-05-13 DIAGNOSIS — M25561 Pain in right knee: Secondary | ICD-10-CM | POA: Diagnosis not present

## 2016-05-13 DIAGNOSIS — M62462 Contracture of muscle, left lower leg: Secondary | ICD-10-CM | POA: Diagnosis not present

## 2016-05-14 DIAGNOSIS — N186 End stage renal disease: Secondary | ICD-10-CM | POA: Diagnosis not present

## 2016-05-14 DIAGNOSIS — Z992 Dependence on renal dialysis: Secondary | ICD-10-CM | POA: Diagnosis not present

## 2016-05-14 DIAGNOSIS — D631 Anemia in chronic kidney disease: Secondary | ICD-10-CM | POA: Diagnosis not present

## 2016-05-14 DIAGNOSIS — D509 Iron deficiency anemia, unspecified: Secondary | ICD-10-CM | POA: Diagnosis not present

## 2016-05-14 DIAGNOSIS — N2581 Secondary hyperparathyroidism of renal origin: Secondary | ICD-10-CM | POA: Diagnosis not present

## 2016-05-15 DIAGNOSIS — M62462 Contracture of muscle, left lower leg: Secondary | ICD-10-CM | POA: Diagnosis not present

## 2016-05-15 DIAGNOSIS — M25673 Stiffness of unspecified ankle, not elsewhere classified: Secondary | ICD-10-CM | POA: Diagnosis not present

## 2016-05-15 DIAGNOSIS — M25561 Pain in right knee: Secondary | ICD-10-CM | POA: Diagnosis not present

## 2016-05-15 DIAGNOSIS — M62461 Contracture of muscle, right lower leg: Secondary | ICD-10-CM | POA: Diagnosis not present

## 2016-05-16 DIAGNOSIS — N186 End stage renal disease: Secondary | ICD-10-CM | POA: Diagnosis not present

## 2016-05-16 DIAGNOSIS — D631 Anemia in chronic kidney disease: Secondary | ICD-10-CM | POA: Diagnosis not present

## 2016-05-16 DIAGNOSIS — D509 Iron deficiency anemia, unspecified: Secondary | ICD-10-CM | POA: Diagnosis not present

## 2016-05-16 DIAGNOSIS — N2581 Secondary hyperparathyroidism of renal origin: Secondary | ICD-10-CM | POA: Diagnosis not present

## 2016-05-16 DIAGNOSIS — Z992 Dependence on renal dialysis: Secondary | ICD-10-CM | POA: Diagnosis not present

## 2016-05-19 DIAGNOSIS — N186 End stage renal disease: Secondary | ICD-10-CM | POA: Diagnosis not present

## 2016-05-19 DIAGNOSIS — D509 Iron deficiency anemia, unspecified: Secondary | ICD-10-CM | POA: Diagnosis not present

## 2016-05-19 DIAGNOSIS — N2581 Secondary hyperparathyroidism of renal origin: Secondary | ICD-10-CM | POA: Diagnosis not present

## 2016-05-19 DIAGNOSIS — D631 Anemia in chronic kidney disease: Secondary | ICD-10-CM | POA: Diagnosis not present

## 2016-05-19 DIAGNOSIS — Z992 Dependence on renal dialysis: Secondary | ICD-10-CM | POA: Diagnosis not present

## 2016-05-20 DIAGNOSIS — M62462 Contracture of muscle, left lower leg: Secondary | ICD-10-CM | POA: Diagnosis not present

## 2016-05-20 DIAGNOSIS — M25673 Stiffness of unspecified ankle, not elsewhere classified: Secondary | ICD-10-CM | POA: Diagnosis not present

## 2016-05-20 DIAGNOSIS — M62461 Contracture of muscle, right lower leg: Secondary | ICD-10-CM | POA: Diagnosis not present

## 2016-05-20 DIAGNOSIS — M25561 Pain in right knee: Secondary | ICD-10-CM | POA: Diagnosis not present

## 2016-05-21 DIAGNOSIS — D509 Iron deficiency anemia, unspecified: Secondary | ICD-10-CM | POA: Diagnosis not present

## 2016-05-21 DIAGNOSIS — D631 Anemia in chronic kidney disease: Secondary | ICD-10-CM | POA: Diagnosis not present

## 2016-05-21 DIAGNOSIS — Z992 Dependence on renal dialysis: Secondary | ICD-10-CM | POA: Diagnosis not present

## 2016-05-21 DIAGNOSIS — N186 End stage renal disease: Secondary | ICD-10-CM | POA: Diagnosis not present

## 2016-05-21 DIAGNOSIS — N2581 Secondary hyperparathyroidism of renal origin: Secondary | ICD-10-CM | POA: Diagnosis not present

## 2016-05-22 DIAGNOSIS — M62462 Contracture of muscle, left lower leg: Secondary | ICD-10-CM | POA: Diagnosis not present

## 2016-05-22 DIAGNOSIS — M62461 Contracture of muscle, right lower leg: Secondary | ICD-10-CM | POA: Diagnosis not present

## 2016-05-22 DIAGNOSIS — M25673 Stiffness of unspecified ankle, not elsewhere classified: Secondary | ICD-10-CM | POA: Diagnosis not present

## 2016-05-22 DIAGNOSIS — M25561 Pain in right knee: Secondary | ICD-10-CM | POA: Diagnosis not present

## 2016-05-23 DIAGNOSIS — N186 End stage renal disease: Secondary | ICD-10-CM | POA: Diagnosis not present

## 2016-05-23 DIAGNOSIS — D631 Anemia in chronic kidney disease: Secondary | ICD-10-CM | POA: Diagnosis not present

## 2016-05-23 DIAGNOSIS — D509 Iron deficiency anemia, unspecified: Secondary | ICD-10-CM | POA: Diagnosis not present

## 2016-05-23 DIAGNOSIS — N2581 Secondary hyperparathyroidism of renal origin: Secondary | ICD-10-CM | POA: Diagnosis not present

## 2016-05-23 DIAGNOSIS — Z992 Dependence on renal dialysis: Secondary | ICD-10-CM | POA: Diagnosis not present

## 2016-05-26 DIAGNOSIS — D509 Iron deficiency anemia, unspecified: Secondary | ICD-10-CM | POA: Diagnosis not present

## 2016-05-26 DIAGNOSIS — N2581 Secondary hyperparathyroidism of renal origin: Secondary | ICD-10-CM | POA: Diagnosis not present

## 2016-05-26 DIAGNOSIS — N186 End stage renal disease: Secondary | ICD-10-CM | POA: Diagnosis not present

## 2016-05-26 DIAGNOSIS — Z992 Dependence on renal dialysis: Secondary | ICD-10-CM | POA: Diagnosis not present

## 2016-05-26 DIAGNOSIS — D631 Anemia in chronic kidney disease: Secondary | ICD-10-CM | POA: Diagnosis not present

## 2016-05-27 DIAGNOSIS — M25561 Pain in right knee: Secondary | ICD-10-CM | POA: Diagnosis not present

## 2016-05-27 DIAGNOSIS — M62461 Contracture of muscle, right lower leg: Secondary | ICD-10-CM | POA: Diagnosis not present

## 2016-05-27 DIAGNOSIS — M25673 Stiffness of unspecified ankle, not elsewhere classified: Secondary | ICD-10-CM | POA: Diagnosis not present

## 2016-05-27 DIAGNOSIS — M62462 Contracture of muscle, left lower leg: Secondary | ICD-10-CM | POA: Diagnosis not present

## 2016-05-28 DIAGNOSIS — N2581 Secondary hyperparathyroidism of renal origin: Secondary | ICD-10-CM | POA: Diagnosis not present

## 2016-05-28 DIAGNOSIS — N186 End stage renal disease: Secondary | ICD-10-CM | POA: Diagnosis not present

## 2016-05-28 DIAGNOSIS — D509 Iron deficiency anemia, unspecified: Secondary | ICD-10-CM | POA: Diagnosis not present

## 2016-05-28 DIAGNOSIS — D631 Anemia in chronic kidney disease: Secondary | ICD-10-CM | POA: Diagnosis not present

## 2016-05-28 DIAGNOSIS — Z992 Dependence on renal dialysis: Secondary | ICD-10-CM | POA: Diagnosis not present

## 2016-05-30 DIAGNOSIS — D509 Iron deficiency anemia, unspecified: Secondary | ICD-10-CM | POA: Diagnosis not present

## 2016-05-30 DIAGNOSIS — Z992 Dependence on renal dialysis: Secondary | ICD-10-CM | POA: Diagnosis not present

## 2016-05-30 DIAGNOSIS — N2581 Secondary hyperparathyroidism of renal origin: Secondary | ICD-10-CM | POA: Diagnosis not present

## 2016-05-30 DIAGNOSIS — N186 End stage renal disease: Secondary | ICD-10-CM | POA: Diagnosis not present

## 2016-05-30 DIAGNOSIS — D631 Anemia in chronic kidney disease: Secondary | ICD-10-CM | POA: Diagnosis not present

## 2016-06-02 DIAGNOSIS — N2581 Secondary hyperparathyroidism of renal origin: Secondary | ICD-10-CM | POA: Diagnosis not present

## 2016-06-02 DIAGNOSIS — N186 End stage renal disease: Secondary | ICD-10-CM | POA: Diagnosis not present

## 2016-06-02 DIAGNOSIS — Z992 Dependence on renal dialysis: Secondary | ICD-10-CM | POA: Diagnosis not present

## 2016-06-02 DIAGNOSIS — D631 Anemia in chronic kidney disease: Secondary | ICD-10-CM | POA: Diagnosis not present

## 2016-06-02 DIAGNOSIS — D509 Iron deficiency anemia, unspecified: Secondary | ICD-10-CM | POA: Diagnosis not present

## 2016-06-04 DIAGNOSIS — N2581 Secondary hyperparathyroidism of renal origin: Secondary | ICD-10-CM | POA: Diagnosis not present

## 2016-06-04 DIAGNOSIS — D509 Iron deficiency anemia, unspecified: Secondary | ICD-10-CM | POA: Diagnosis not present

## 2016-06-04 DIAGNOSIS — D631 Anemia in chronic kidney disease: Secondary | ICD-10-CM | POA: Diagnosis not present

## 2016-06-04 DIAGNOSIS — N186 End stage renal disease: Secondary | ICD-10-CM | POA: Diagnosis not present

## 2016-06-04 DIAGNOSIS — Z992 Dependence on renal dialysis: Secondary | ICD-10-CM | POA: Diagnosis not present

## 2016-06-06 DIAGNOSIS — D509 Iron deficiency anemia, unspecified: Secondary | ICD-10-CM | POA: Diagnosis not present

## 2016-06-06 DIAGNOSIS — N186 End stage renal disease: Secondary | ICD-10-CM | POA: Diagnosis not present

## 2016-06-06 DIAGNOSIS — D631 Anemia in chronic kidney disease: Secondary | ICD-10-CM | POA: Diagnosis not present

## 2016-06-06 DIAGNOSIS — Z992 Dependence on renal dialysis: Secondary | ICD-10-CM | POA: Diagnosis not present

## 2016-06-06 DIAGNOSIS — N2581 Secondary hyperparathyroidism of renal origin: Secondary | ICD-10-CM | POA: Diagnosis not present

## 2016-06-09 DIAGNOSIS — D509 Iron deficiency anemia, unspecified: Secondary | ICD-10-CM | POA: Diagnosis not present

## 2016-06-09 DIAGNOSIS — Z992 Dependence on renal dialysis: Secondary | ICD-10-CM | POA: Diagnosis not present

## 2016-06-09 DIAGNOSIS — N2581 Secondary hyperparathyroidism of renal origin: Secondary | ICD-10-CM | POA: Diagnosis not present

## 2016-06-09 DIAGNOSIS — N186 End stage renal disease: Secondary | ICD-10-CM | POA: Diagnosis not present

## 2016-06-09 DIAGNOSIS — D631 Anemia in chronic kidney disease: Secondary | ICD-10-CM | POA: Diagnosis not present

## 2016-06-11 DIAGNOSIS — N186 End stage renal disease: Secondary | ICD-10-CM | POA: Diagnosis not present

## 2016-06-11 DIAGNOSIS — D509 Iron deficiency anemia, unspecified: Secondary | ICD-10-CM | POA: Diagnosis not present

## 2016-06-11 DIAGNOSIS — D631 Anemia in chronic kidney disease: Secondary | ICD-10-CM | POA: Diagnosis not present

## 2016-06-11 DIAGNOSIS — Z992 Dependence on renal dialysis: Secondary | ICD-10-CM | POA: Diagnosis not present

## 2016-06-11 DIAGNOSIS — N2581 Secondary hyperparathyroidism of renal origin: Secondary | ICD-10-CM | POA: Diagnosis not present

## 2016-06-13 DIAGNOSIS — D509 Iron deficiency anemia, unspecified: Secondary | ICD-10-CM | POA: Diagnosis not present

## 2016-06-13 DIAGNOSIS — N2581 Secondary hyperparathyroidism of renal origin: Secondary | ICD-10-CM | POA: Diagnosis not present

## 2016-06-13 DIAGNOSIS — Z992 Dependence on renal dialysis: Secondary | ICD-10-CM | POA: Diagnosis not present

## 2016-06-13 DIAGNOSIS — D631 Anemia in chronic kidney disease: Secondary | ICD-10-CM | POA: Diagnosis not present

## 2016-06-13 DIAGNOSIS — N186 End stage renal disease: Secondary | ICD-10-CM | POA: Diagnosis not present

## 2016-06-14 DIAGNOSIS — Z992 Dependence on renal dialysis: Secondary | ICD-10-CM | POA: Diagnosis not present

## 2016-06-14 DIAGNOSIS — N186 End stage renal disease: Secondary | ICD-10-CM | POA: Diagnosis not present

## 2016-06-16 DIAGNOSIS — D509 Iron deficiency anemia, unspecified: Secondary | ICD-10-CM | POA: Diagnosis not present

## 2016-06-16 DIAGNOSIS — D631 Anemia in chronic kidney disease: Secondary | ICD-10-CM | POA: Diagnosis not present

## 2016-06-16 DIAGNOSIS — N186 End stage renal disease: Secondary | ICD-10-CM | POA: Diagnosis not present

## 2016-06-16 DIAGNOSIS — Z992 Dependence on renal dialysis: Secondary | ICD-10-CM | POA: Diagnosis not present

## 2016-06-16 DIAGNOSIS — N2581 Secondary hyperparathyroidism of renal origin: Secondary | ICD-10-CM | POA: Diagnosis not present

## 2016-06-18 DIAGNOSIS — D631 Anemia in chronic kidney disease: Secondary | ICD-10-CM | POA: Diagnosis not present

## 2016-06-18 DIAGNOSIS — Z992 Dependence on renal dialysis: Secondary | ICD-10-CM | POA: Diagnosis not present

## 2016-06-18 DIAGNOSIS — N2581 Secondary hyperparathyroidism of renal origin: Secondary | ICD-10-CM | POA: Diagnosis not present

## 2016-06-18 DIAGNOSIS — N186 End stage renal disease: Secondary | ICD-10-CM | POA: Diagnosis not present

## 2016-06-18 DIAGNOSIS — D509 Iron deficiency anemia, unspecified: Secondary | ICD-10-CM | POA: Diagnosis not present

## 2016-06-20 DIAGNOSIS — D509 Iron deficiency anemia, unspecified: Secondary | ICD-10-CM | POA: Diagnosis not present

## 2016-06-20 DIAGNOSIS — N2581 Secondary hyperparathyroidism of renal origin: Secondary | ICD-10-CM | POA: Diagnosis not present

## 2016-06-20 DIAGNOSIS — Z992 Dependence on renal dialysis: Secondary | ICD-10-CM | POA: Diagnosis not present

## 2016-06-20 DIAGNOSIS — D631 Anemia in chronic kidney disease: Secondary | ICD-10-CM | POA: Diagnosis not present

## 2016-06-20 DIAGNOSIS — N186 End stage renal disease: Secondary | ICD-10-CM | POA: Diagnosis not present

## 2016-06-23 DIAGNOSIS — N186 End stage renal disease: Secondary | ICD-10-CM | POA: Diagnosis not present

## 2016-06-23 DIAGNOSIS — D631 Anemia in chronic kidney disease: Secondary | ICD-10-CM | POA: Diagnosis not present

## 2016-06-23 DIAGNOSIS — Z992 Dependence on renal dialysis: Secondary | ICD-10-CM | POA: Diagnosis not present

## 2016-06-23 DIAGNOSIS — D509 Iron deficiency anemia, unspecified: Secondary | ICD-10-CM | POA: Diagnosis not present

## 2016-06-23 DIAGNOSIS — E119 Type 2 diabetes mellitus without complications: Secondary | ICD-10-CM | POA: Diagnosis not present

## 2016-06-23 DIAGNOSIS — N2581 Secondary hyperparathyroidism of renal origin: Secondary | ICD-10-CM | POA: Diagnosis not present

## 2016-06-25 DIAGNOSIS — N186 End stage renal disease: Secondary | ICD-10-CM | POA: Diagnosis not present

## 2016-06-25 DIAGNOSIS — D509 Iron deficiency anemia, unspecified: Secondary | ICD-10-CM | POA: Diagnosis not present

## 2016-06-25 DIAGNOSIS — N2581 Secondary hyperparathyroidism of renal origin: Secondary | ICD-10-CM | POA: Diagnosis not present

## 2016-06-25 DIAGNOSIS — D631 Anemia in chronic kidney disease: Secondary | ICD-10-CM | POA: Diagnosis not present

## 2016-06-25 DIAGNOSIS — Z992 Dependence on renal dialysis: Secondary | ICD-10-CM | POA: Diagnosis not present

## 2016-06-27 DIAGNOSIS — N186 End stage renal disease: Secondary | ICD-10-CM | POA: Diagnosis not present

## 2016-06-27 DIAGNOSIS — Z992 Dependence on renal dialysis: Secondary | ICD-10-CM | POA: Diagnosis not present

## 2016-06-27 DIAGNOSIS — D509 Iron deficiency anemia, unspecified: Secondary | ICD-10-CM | POA: Diagnosis not present

## 2016-06-27 DIAGNOSIS — D631 Anemia in chronic kidney disease: Secondary | ICD-10-CM | POA: Diagnosis not present

## 2016-06-27 DIAGNOSIS — N2581 Secondary hyperparathyroidism of renal origin: Secondary | ICD-10-CM | POA: Diagnosis not present

## 2016-06-30 DIAGNOSIS — D509 Iron deficiency anemia, unspecified: Secondary | ICD-10-CM | POA: Diagnosis not present

## 2016-06-30 DIAGNOSIS — D631 Anemia in chronic kidney disease: Secondary | ICD-10-CM | POA: Diagnosis not present

## 2016-06-30 DIAGNOSIS — N2581 Secondary hyperparathyroidism of renal origin: Secondary | ICD-10-CM | POA: Diagnosis not present

## 2016-06-30 DIAGNOSIS — N186 End stage renal disease: Secondary | ICD-10-CM | POA: Diagnosis not present

## 2016-06-30 DIAGNOSIS — Z992 Dependence on renal dialysis: Secondary | ICD-10-CM | POA: Diagnosis not present

## 2016-07-02 DIAGNOSIS — D509 Iron deficiency anemia, unspecified: Secondary | ICD-10-CM | POA: Diagnosis not present

## 2016-07-02 DIAGNOSIS — N186 End stage renal disease: Secondary | ICD-10-CM | POA: Diagnosis not present

## 2016-07-02 DIAGNOSIS — Z992 Dependence on renal dialysis: Secondary | ICD-10-CM | POA: Diagnosis not present

## 2016-07-02 DIAGNOSIS — D631 Anemia in chronic kidney disease: Secondary | ICD-10-CM | POA: Diagnosis not present

## 2016-07-02 DIAGNOSIS — N2581 Secondary hyperparathyroidism of renal origin: Secondary | ICD-10-CM | POA: Diagnosis not present

## 2016-07-04 DIAGNOSIS — D631 Anemia in chronic kidney disease: Secondary | ICD-10-CM | POA: Diagnosis not present

## 2016-07-04 DIAGNOSIS — D509 Iron deficiency anemia, unspecified: Secondary | ICD-10-CM | POA: Diagnosis not present

## 2016-07-04 DIAGNOSIS — N186 End stage renal disease: Secondary | ICD-10-CM | POA: Diagnosis not present

## 2016-07-04 DIAGNOSIS — N2581 Secondary hyperparathyroidism of renal origin: Secondary | ICD-10-CM | POA: Diagnosis not present

## 2016-07-04 DIAGNOSIS — Z992 Dependence on renal dialysis: Secondary | ICD-10-CM | POA: Diagnosis not present

## 2016-07-07 DIAGNOSIS — D631 Anemia in chronic kidney disease: Secondary | ICD-10-CM | POA: Diagnosis not present

## 2016-07-07 DIAGNOSIS — D509 Iron deficiency anemia, unspecified: Secondary | ICD-10-CM | POA: Diagnosis not present

## 2016-07-07 DIAGNOSIS — N2581 Secondary hyperparathyroidism of renal origin: Secondary | ICD-10-CM | POA: Diagnosis not present

## 2016-07-07 DIAGNOSIS — N186 End stage renal disease: Secondary | ICD-10-CM | POA: Diagnosis not present

## 2016-07-07 DIAGNOSIS — Z992 Dependence on renal dialysis: Secondary | ICD-10-CM | POA: Diagnosis not present

## 2016-07-09 DIAGNOSIS — D509 Iron deficiency anemia, unspecified: Secondary | ICD-10-CM | POA: Diagnosis not present

## 2016-07-09 DIAGNOSIS — N2581 Secondary hyperparathyroidism of renal origin: Secondary | ICD-10-CM | POA: Diagnosis not present

## 2016-07-09 DIAGNOSIS — D631 Anemia in chronic kidney disease: Secondary | ICD-10-CM | POA: Diagnosis not present

## 2016-07-09 DIAGNOSIS — N186 End stage renal disease: Secondary | ICD-10-CM | POA: Diagnosis not present

## 2016-07-09 DIAGNOSIS — Z992 Dependence on renal dialysis: Secondary | ICD-10-CM | POA: Diagnosis not present

## 2016-07-11 DIAGNOSIS — Z992 Dependence on renal dialysis: Secondary | ICD-10-CM | POA: Diagnosis not present

## 2016-07-11 DIAGNOSIS — D631 Anemia in chronic kidney disease: Secondary | ICD-10-CM | POA: Diagnosis not present

## 2016-07-11 DIAGNOSIS — N2581 Secondary hyperparathyroidism of renal origin: Secondary | ICD-10-CM | POA: Diagnosis not present

## 2016-07-11 DIAGNOSIS — N186 End stage renal disease: Secondary | ICD-10-CM | POA: Diagnosis not present

## 2016-07-11 DIAGNOSIS — D509 Iron deficiency anemia, unspecified: Secondary | ICD-10-CM | POA: Diagnosis not present

## 2016-07-14 DIAGNOSIS — D509 Iron deficiency anemia, unspecified: Secondary | ICD-10-CM | POA: Diagnosis not present

## 2016-07-14 DIAGNOSIS — Z992 Dependence on renal dialysis: Secondary | ICD-10-CM | POA: Diagnosis not present

## 2016-07-14 DIAGNOSIS — N186 End stage renal disease: Secondary | ICD-10-CM | POA: Diagnosis not present

## 2016-07-14 DIAGNOSIS — N2581 Secondary hyperparathyroidism of renal origin: Secondary | ICD-10-CM | POA: Diagnosis not present

## 2016-07-14 DIAGNOSIS — D631 Anemia in chronic kidney disease: Secondary | ICD-10-CM | POA: Diagnosis not present

## 2016-07-16 DIAGNOSIS — N186 End stage renal disease: Secondary | ICD-10-CM | POA: Diagnosis not present

## 2016-07-16 DIAGNOSIS — Z992 Dependence on renal dialysis: Secondary | ICD-10-CM | POA: Diagnosis not present

## 2016-07-16 DIAGNOSIS — D509 Iron deficiency anemia, unspecified: Secondary | ICD-10-CM | POA: Diagnosis not present

## 2016-07-16 DIAGNOSIS — N2581 Secondary hyperparathyroidism of renal origin: Secondary | ICD-10-CM | POA: Diagnosis not present

## 2016-07-16 DIAGNOSIS — D631 Anemia in chronic kidney disease: Secondary | ICD-10-CM | POA: Diagnosis not present

## 2016-07-18 DIAGNOSIS — Z992 Dependence on renal dialysis: Secondary | ICD-10-CM | POA: Diagnosis not present

## 2016-07-18 DIAGNOSIS — D509 Iron deficiency anemia, unspecified: Secondary | ICD-10-CM | POA: Diagnosis not present

## 2016-07-18 DIAGNOSIS — N2581 Secondary hyperparathyroidism of renal origin: Secondary | ICD-10-CM | POA: Diagnosis not present

## 2016-07-18 DIAGNOSIS — N186 End stage renal disease: Secondary | ICD-10-CM | POA: Diagnosis not present

## 2016-07-18 DIAGNOSIS — D631 Anemia in chronic kidney disease: Secondary | ICD-10-CM | POA: Diagnosis not present

## 2016-07-21 DIAGNOSIS — D509 Iron deficiency anemia, unspecified: Secondary | ICD-10-CM | POA: Diagnosis not present

## 2016-07-21 DIAGNOSIS — N2581 Secondary hyperparathyroidism of renal origin: Secondary | ICD-10-CM | POA: Diagnosis not present

## 2016-07-21 DIAGNOSIS — N186 End stage renal disease: Secondary | ICD-10-CM | POA: Diagnosis not present

## 2016-07-21 DIAGNOSIS — D631 Anemia in chronic kidney disease: Secondary | ICD-10-CM | POA: Diagnosis not present

## 2016-07-21 DIAGNOSIS — Z992 Dependence on renal dialysis: Secondary | ICD-10-CM | POA: Diagnosis not present

## 2016-07-23 DIAGNOSIS — Z992 Dependence on renal dialysis: Secondary | ICD-10-CM | POA: Diagnosis not present

## 2016-07-23 DIAGNOSIS — N2581 Secondary hyperparathyroidism of renal origin: Secondary | ICD-10-CM | POA: Diagnosis not present

## 2016-07-23 DIAGNOSIS — N186 End stage renal disease: Secondary | ICD-10-CM | POA: Diagnosis not present

## 2016-07-23 DIAGNOSIS — D631 Anemia in chronic kidney disease: Secondary | ICD-10-CM | POA: Diagnosis not present

## 2016-07-23 DIAGNOSIS — D509 Iron deficiency anemia, unspecified: Secondary | ICD-10-CM | POA: Diagnosis not present

## 2016-07-25 DIAGNOSIS — D509 Iron deficiency anemia, unspecified: Secondary | ICD-10-CM | POA: Diagnosis not present

## 2016-07-25 DIAGNOSIS — N186 End stage renal disease: Secondary | ICD-10-CM | POA: Diagnosis not present

## 2016-07-25 DIAGNOSIS — Z992 Dependence on renal dialysis: Secondary | ICD-10-CM | POA: Diagnosis not present

## 2016-07-25 DIAGNOSIS — N2581 Secondary hyperparathyroidism of renal origin: Secondary | ICD-10-CM | POA: Diagnosis not present

## 2016-07-25 DIAGNOSIS — D631 Anemia in chronic kidney disease: Secondary | ICD-10-CM | POA: Diagnosis not present

## 2016-07-28 DIAGNOSIS — Z992 Dependence on renal dialysis: Secondary | ICD-10-CM | POA: Diagnosis not present

## 2016-07-28 DIAGNOSIS — N186 End stage renal disease: Secondary | ICD-10-CM | POA: Diagnosis not present

## 2016-07-28 DIAGNOSIS — N2581 Secondary hyperparathyroidism of renal origin: Secondary | ICD-10-CM | POA: Diagnosis not present

## 2016-07-28 DIAGNOSIS — D631 Anemia in chronic kidney disease: Secondary | ICD-10-CM | POA: Diagnosis not present

## 2016-07-28 DIAGNOSIS — D509 Iron deficiency anemia, unspecified: Secondary | ICD-10-CM | POA: Diagnosis not present

## 2016-07-30 DIAGNOSIS — D631 Anemia in chronic kidney disease: Secondary | ICD-10-CM | POA: Diagnosis not present

## 2016-07-30 DIAGNOSIS — N2581 Secondary hyperparathyroidism of renal origin: Secondary | ICD-10-CM | POA: Diagnosis not present

## 2016-07-30 DIAGNOSIS — N186 End stage renal disease: Secondary | ICD-10-CM | POA: Diagnosis not present

## 2016-07-30 DIAGNOSIS — D509 Iron deficiency anemia, unspecified: Secondary | ICD-10-CM | POA: Diagnosis not present

## 2016-07-30 DIAGNOSIS — Z992 Dependence on renal dialysis: Secondary | ICD-10-CM | POA: Diagnosis not present

## 2016-08-01 DIAGNOSIS — D509 Iron deficiency anemia, unspecified: Secondary | ICD-10-CM | POA: Diagnosis not present

## 2016-08-01 DIAGNOSIS — D631 Anemia in chronic kidney disease: Secondary | ICD-10-CM | POA: Diagnosis not present

## 2016-08-01 DIAGNOSIS — N2581 Secondary hyperparathyroidism of renal origin: Secondary | ICD-10-CM | POA: Diagnosis not present

## 2016-08-01 DIAGNOSIS — Z992 Dependence on renal dialysis: Secondary | ICD-10-CM | POA: Diagnosis not present

## 2016-08-01 DIAGNOSIS — N186 End stage renal disease: Secondary | ICD-10-CM | POA: Diagnosis not present

## 2016-08-04 DIAGNOSIS — D509 Iron deficiency anemia, unspecified: Secondary | ICD-10-CM | POA: Diagnosis not present

## 2016-08-04 DIAGNOSIS — D631 Anemia in chronic kidney disease: Secondary | ICD-10-CM | POA: Diagnosis not present

## 2016-08-04 DIAGNOSIS — N186 End stage renal disease: Secondary | ICD-10-CM | POA: Diagnosis not present

## 2016-08-04 DIAGNOSIS — N2581 Secondary hyperparathyroidism of renal origin: Secondary | ICD-10-CM | POA: Diagnosis not present

## 2016-08-04 DIAGNOSIS — Z992 Dependence on renal dialysis: Secondary | ICD-10-CM | POA: Diagnosis not present

## 2016-08-06 DIAGNOSIS — N2581 Secondary hyperparathyroidism of renal origin: Secondary | ICD-10-CM | POA: Diagnosis not present

## 2016-08-06 DIAGNOSIS — N186 End stage renal disease: Secondary | ICD-10-CM | POA: Diagnosis not present

## 2016-08-06 DIAGNOSIS — D509 Iron deficiency anemia, unspecified: Secondary | ICD-10-CM | POA: Diagnosis not present

## 2016-08-06 DIAGNOSIS — Z992 Dependence on renal dialysis: Secondary | ICD-10-CM | POA: Diagnosis not present

## 2016-08-06 DIAGNOSIS — D631 Anemia in chronic kidney disease: Secondary | ICD-10-CM | POA: Diagnosis not present

## 2016-08-08 DIAGNOSIS — Z992 Dependence on renal dialysis: Secondary | ICD-10-CM | POA: Diagnosis not present

## 2016-08-08 DIAGNOSIS — D631 Anemia in chronic kidney disease: Secondary | ICD-10-CM | POA: Diagnosis not present

## 2016-08-08 DIAGNOSIS — D509 Iron deficiency anemia, unspecified: Secondary | ICD-10-CM | POA: Diagnosis not present

## 2016-08-08 DIAGNOSIS — N2581 Secondary hyperparathyroidism of renal origin: Secondary | ICD-10-CM | POA: Diagnosis not present

## 2016-08-08 DIAGNOSIS — N186 End stage renal disease: Secondary | ICD-10-CM | POA: Diagnosis not present

## 2016-08-11 DIAGNOSIS — N2581 Secondary hyperparathyroidism of renal origin: Secondary | ICD-10-CM | POA: Diagnosis not present

## 2016-08-11 DIAGNOSIS — Z992 Dependence on renal dialysis: Secondary | ICD-10-CM | POA: Diagnosis not present

## 2016-08-11 DIAGNOSIS — D509 Iron deficiency anemia, unspecified: Secondary | ICD-10-CM | POA: Diagnosis not present

## 2016-08-11 DIAGNOSIS — D631 Anemia in chronic kidney disease: Secondary | ICD-10-CM | POA: Diagnosis not present

## 2016-08-11 DIAGNOSIS — N186 End stage renal disease: Secondary | ICD-10-CM | POA: Diagnosis not present

## 2016-08-13 DIAGNOSIS — N2581 Secondary hyperparathyroidism of renal origin: Secondary | ICD-10-CM | POA: Diagnosis not present

## 2016-08-13 DIAGNOSIS — Z992 Dependence on renal dialysis: Secondary | ICD-10-CM | POA: Diagnosis not present

## 2016-08-13 DIAGNOSIS — N186 End stage renal disease: Secondary | ICD-10-CM | POA: Diagnosis not present

## 2016-08-13 DIAGNOSIS — D509 Iron deficiency anemia, unspecified: Secondary | ICD-10-CM | POA: Diagnosis not present

## 2016-08-13 DIAGNOSIS — D631 Anemia in chronic kidney disease: Secondary | ICD-10-CM | POA: Diagnosis not present

## 2016-08-14 DIAGNOSIS — Z992 Dependence on renal dialysis: Secondary | ICD-10-CM | POA: Diagnosis not present

## 2016-08-14 DIAGNOSIS — N186 End stage renal disease: Secondary | ICD-10-CM | POA: Diagnosis not present

## 2016-08-15 DIAGNOSIS — N186 End stage renal disease: Secondary | ICD-10-CM | POA: Diagnosis not present

## 2016-08-15 DIAGNOSIS — D631 Anemia in chronic kidney disease: Secondary | ICD-10-CM | POA: Diagnosis not present

## 2016-08-15 DIAGNOSIS — D509 Iron deficiency anemia, unspecified: Secondary | ICD-10-CM | POA: Diagnosis not present

## 2016-08-15 DIAGNOSIS — N2581 Secondary hyperparathyroidism of renal origin: Secondary | ICD-10-CM | POA: Diagnosis not present

## 2016-08-15 DIAGNOSIS — Z992 Dependence on renal dialysis: Secondary | ICD-10-CM | POA: Diagnosis not present

## 2016-08-18 DIAGNOSIS — N186 End stage renal disease: Secondary | ICD-10-CM | POA: Diagnosis not present

## 2016-08-18 DIAGNOSIS — D631 Anemia in chronic kidney disease: Secondary | ICD-10-CM | POA: Diagnosis not present

## 2016-08-18 DIAGNOSIS — N2581 Secondary hyperparathyroidism of renal origin: Secondary | ICD-10-CM | POA: Diagnosis not present

## 2016-08-18 DIAGNOSIS — Z992 Dependence on renal dialysis: Secondary | ICD-10-CM | POA: Diagnosis not present

## 2016-08-18 DIAGNOSIS — D509 Iron deficiency anemia, unspecified: Secondary | ICD-10-CM | POA: Diagnosis not present

## 2016-08-20 DIAGNOSIS — D509 Iron deficiency anemia, unspecified: Secondary | ICD-10-CM | POA: Diagnosis not present

## 2016-08-20 DIAGNOSIS — N186 End stage renal disease: Secondary | ICD-10-CM | POA: Diagnosis not present

## 2016-08-20 DIAGNOSIS — Z992 Dependence on renal dialysis: Secondary | ICD-10-CM | POA: Diagnosis not present

## 2016-08-20 DIAGNOSIS — D631 Anemia in chronic kidney disease: Secondary | ICD-10-CM | POA: Diagnosis not present

## 2016-08-20 DIAGNOSIS — N2581 Secondary hyperparathyroidism of renal origin: Secondary | ICD-10-CM | POA: Diagnosis not present

## 2016-08-22 DIAGNOSIS — D631 Anemia in chronic kidney disease: Secondary | ICD-10-CM | POA: Diagnosis not present

## 2016-08-22 DIAGNOSIS — N2581 Secondary hyperparathyroidism of renal origin: Secondary | ICD-10-CM | POA: Diagnosis not present

## 2016-08-22 DIAGNOSIS — D509 Iron deficiency anemia, unspecified: Secondary | ICD-10-CM | POA: Diagnosis not present

## 2016-08-22 DIAGNOSIS — N186 End stage renal disease: Secondary | ICD-10-CM | POA: Diagnosis not present

## 2016-08-22 DIAGNOSIS — Z992 Dependence on renal dialysis: Secondary | ICD-10-CM | POA: Diagnosis not present

## 2016-08-25 DIAGNOSIS — D631 Anemia in chronic kidney disease: Secondary | ICD-10-CM | POA: Diagnosis not present

## 2016-08-25 DIAGNOSIS — N186 End stage renal disease: Secondary | ICD-10-CM | POA: Diagnosis not present

## 2016-08-25 DIAGNOSIS — Z992 Dependence on renal dialysis: Secondary | ICD-10-CM | POA: Diagnosis not present

## 2016-08-25 DIAGNOSIS — N2581 Secondary hyperparathyroidism of renal origin: Secondary | ICD-10-CM | POA: Diagnosis not present

## 2016-08-25 DIAGNOSIS — D509 Iron deficiency anemia, unspecified: Secondary | ICD-10-CM | POA: Diagnosis not present

## 2016-08-27 DIAGNOSIS — Z992 Dependence on renal dialysis: Secondary | ICD-10-CM | POA: Diagnosis not present

## 2016-08-27 DIAGNOSIS — D631 Anemia in chronic kidney disease: Secondary | ICD-10-CM | POA: Diagnosis not present

## 2016-08-27 DIAGNOSIS — N2581 Secondary hyperparathyroidism of renal origin: Secondary | ICD-10-CM | POA: Diagnosis not present

## 2016-08-27 DIAGNOSIS — D509 Iron deficiency anemia, unspecified: Secondary | ICD-10-CM | POA: Diagnosis not present

## 2016-08-27 DIAGNOSIS — N186 End stage renal disease: Secondary | ICD-10-CM | POA: Diagnosis not present

## 2016-08-29 DIAGNOSIS — N2581 Secondary hyperparathyroidism of renal origin: Secondary | ICD-10-CM | POA: Diagnosis not present

## 2016-08-29 DIAGNOSIS — Z992 Dependence on renal dialysis: Secondary | ICD-10-CM | POA: Diagnosis not present

## 2016-08-29 DIAGNOSIS — D509 Iron deficiency anemia, unspecified: Secondary | ICD-10-CM | POA: Diagnosis not present

## 2016-08-29 DIAGNOSIS — N186 End stage renal disease: Secondary | ICD-10-CM | POA: Diagnosis not present

## 2016-08-29 DIAGNOSIS — D631 Anemia in chronic kidney disease: Secondary | ICD-10-CM | POA: Diagnosis not present

## 2016-09-01 DIAGNOSIS — D631 Anemia in chronic kidney disease: Secondary | ICD-10-CM | POA: Diagnosis not present

## 2016-09-01 DIAGNOSIS — N186 End stage renal disease: Secondary | ICD-10-CM | POA: Diagnosis not present

## 2016-09-01 DIAGNOSIS — N2581 Secondary hyperparathyroidism of renal origin: Secondary | ICD-10-CM | POA: Diagnosis not present

## 2016-09-01 DIAGNOSIS — D509 Iron deficiency anemia, unspecified: Secondary | ICD-10-CM | POA: Diagnosis not present

## 2016-09-01 DIAGNOSIS — Z992 Dependence on renal dialysis: Secondary | ICD-10-CM | POA: Diagnosis not present

## 2016-09-03 DIAGNOSIS — D631 Anemia in chronic kidney disease: Secondary | ICD-10-CM | POA: Diagnosis not present

## 2016-09-03 DIAGNOSIS — D509 Iron deficiency anemia, unspecified: Secondary | ICD-10-CM | POA: Diagnosis not present

## 2016-09-03 DIAGNOSIS — N2581 Secondary hyperparathyroidism of renal origin: Secondary | ICD-10-CM | POA: Diagnosis not present

## 2016-09-03 DIAGNOSIS — N186 End stage renal disease: Secondary | ICD-10-CM | POA: Diagnosis not present

## 2016-09-03 DIAGNOSIS — Z992 Dependence on renal dialysis: Secondary | ICD-10-CM | POA: Diagnosis not present

## 2016-09-05 DIAGNOSIS — N186 End stage renal disease: Secondary | ICD-10-CM | POA: Diagnosis not present

## 2016-09-05 DIAGNOSIS — N2581 Secondary hyperparathyroidism of renal origin: Secondary | ICD-10-CM | POA: Diagnosis not present

## 2016-09-05 DIAGNOSIS — D631 Anemia in chronic kidney disease: Secondary | ICD-10-CM | POA: Diagnosis not present

## 2016-09-05 DIAGNOSIS — Z992 Dependence on renal dialysis: Secondary | ICD-10-CM | POA: Diagnosis not present

## 2016-09-05 DIAGNOSIS — D509 Iron deficiency anemia, unspecified: Secondary | ICD-10-CM | POA: Diagnosis not present

## 2016-09-08 DIAGNOSIS — N2581 Secondary hyperparathyroidism of renal origin: Secondary | ICD-10-CM | POA: Diagnosis not present

## 2016-09-08 DIAGNOSIS — D631 Anemia in chronic kidney disease: Secondary | ICD-10-CM | POA: Diagnosis not present

## 2016-09-08 DIAGNOSIS — N186 End stage renal disease: Secondary | ICD-10-CM | POA: Diagnosis not present

## 2016-09-08 DIAGNOSIS — Z992 Dependence on renal dialysis: Secondary | ICD-10-CM | POA: Diagnosis not present

## 2016-09-08 DIAGNOSIS — D509 Iron deficiency anemia, unspecified: Secondary | ICD-10-CM | POA: Diagnosis not present

## 2016-09-10 DIAGNOSIS — D509 Iron deficiency anemia, unspecified: Secondary | ICD-10-CM | POA: Diagnosis not present

## 2016-09-10 DIAGNOSIS — N186 End stage renal disease: Secondary | ICD-10-CM | POA: Diagnosis not present

## 2016-09-10 DIAGNOSIS — N2581 Secondary hyperparathyroidism of renal origin: Secondary | ICD-10-CM | POA: Diagnosis not present

## 2016-09-10 DIAGNOSIS — D631 Anemia in chronic kidney disease: Secondary | ICD-10-CM | POA: Diagnosis not present

## 2016-09-10 DIAGNOSIS — Z992 Dependence on renal dialysis: Secondary | ICD-10-CM | POA: Diagnosis not present

## 2016-09-12 DIAGNOSIS — D509 Iron deficiency anemia, unspecified: Secondary | ICD-10-CM | POA: Diagnosis not present

## 2016-09-12 DIAGNOSIS — N2581 Secondary hyperparathyroidism of renal origin: Secondary | ICD-10-CM | POA: Diagnosis not present

## 2016-09-12 DIAGNOSIS — N186 End stage renal disease: Secondary | ICD-10-CM | POA: Diagnosis not present

## 2016-09-12 DIAGNOSIS — Z992 Dependence on renal dialysis: Secondary | ICD-10-CM | POA: Diagnosis not present

## 2016-09-12 DIAGNOSIS — D631 Anemia in chronic kidney disease: Secondary | ICD-10-CM | POA: Diagnosis not present

## 2016-09-13 DIAGNOSIS — N186 End stage renal disease: Secondary | ICD-10-CM | POA: Diagnosis not present

## 2016-09-13 DIAGNOSIS — Z992 Dependence on renal dialysis: Secondary | ICD-10-CM | POA: Diagnosis not present

## 2016-09-15 DIAGNOSIS — D631 Anemia in chronic kidney disease: Secondary | ICD-10-CM | POA: Diagnosis not present

## 2016-09-15 DIAGNOSIS — D509 Iron deficiency anemia, unspecified: Secondary | ICD-10-CM | POA: Diagnosis not present

## 2016-09-15 DIAGNOSIS — Z992 Dependence on renal dialysis: Secondary | ICD-10-CM | POA: Diagnosis not present

## 2016-09-15 DIAGNOSIS — N186 End stage renal disease: Secondary | ICD-10-CM | POA: Diagnosis not present

## 2016-09-15 DIAGNOSIS — N2581 Secondary hyperparathyroidism of renal origin: Secondary | ICD-10-CM | POA: Diagnosis not present

## 2016-09-17 DIAGNOSIS — D631 Anemia in chronic kidney disease: Secondary | ICD-10-CM | POA: Diagnosis not present

## 2016-09-17 DIAGNOSIS — N186 End stage renal disease: Secondary | ICD-10-CM | POA: Diagnosis not present

## 2016-09-17 DIAGNOSIS — Z992 Dependence on renal dialysis: Secondary | ICD-10-CM | POA: Diagnosis not present

## 2016-09-17 DIAGNOSIS — D509 Iron deficiency anemia, unspecified: Secondary | ICD-10-CM | POA: Diagnosis not present

## 2016-09-17 DIAGNOSIS — N2581 Secondary hyperparathyroidism of renal origin: Secondary | ICD-10-CM | POA: Diagnosis not present

## 2016-09-19 DIAGNOSIS — D631 Anemia in chronic kidney disease: Secondary | ICD-10-CM | POA: Diagnosis not present

## 2016-09-19 DIAGNOSIS — Z992 Dependence on renal dialysis: Secondary | ICD-10-CM | POA: Diagnosis not present

## 2016-09-19 DIAGNOSIS — D509 Iron deficiency anemia, unspecified: Secondary | ICD-10-CM | POA: Diagnosis not present

## 2016-09-19 DIAGNOSIS — N186 End stage renal disease: Secondary | ICD-10-CM | POA: Diagnosis not present

## 2016-09-19 DIAGNOSIS — N2581 Secondary hyperparathyroidism of renal origin: Secondary | ICD-10-CM | POA: Diagnosis not present

## 2016-09-22 DIAGNOSIS — Z992 Dependence on renal dialysis: Secondary | ICD-10-CM | POA: Diagnosis not present

## 2016-09-22 DIAGNOSIS — N186 End stage renal disease: Secondary | ICD-10-CM | POA: Diagnosis not present

## 2016-09-22 DIAGNOSIS — N2581 Secondary hyperparathyroidism of renal origin: Secondary | ICD-10-CM | POA: Diagnosis not present

## 2016-09-22 DIAGNOSIS — E119 Type 2 diabetes mellitus without complications: Secondary | ICD-10-CM | POA: Diagnosis not present

## 2016-09-22 DIAGNOSIS — D631 Anemia in chronic kidney disease: Secondary | ICD-10-CM | POA: Diagnosis not present

## 2016-09-22 DIAGNOSIS — D509 Iron deficiency anemia, unspecified: Secondary | ICD-10-CM | POA: Diagnosis not present

## 2016-09-24 DIAGNOSIS — D631 Anemia in chronic kidney disease: Secondary | ICD-10-CM | POA: Diagnosis not present

## 2016-09-24 DIAGNOSIS — D509 Iron deficiency anemia, unspecified: Secondary | ICD-10-CM | POA: Diagnosis not present

## 2016-09-24 DIAGNOSIS — N2581 Secondary hyperparathyroidism of renal origin: Secondary | ICD-10-CM | POA: Diagnosis not present

## 2016-09-24 DIAGNOSIS — N186 End stage renal disease: Secondary | ICD-10-CM | POA: Diagnosis not present

## 2016-09-24 DIAGNOSIS — Z992 Dependence on renal dialysis: Secondary | ICD-10-CM | POA: Diagnosis not present

## 2016-09-26 DIAGNOSIS — N2581 Secondary hyperparathyroidism of renal origin: Secondary | ICD-10-CM | POA: Diagnosis not present

## 2016-09-26 DIAGNOSIS — Z992 Dependence on renal dialysis: Secondary | ICD-10-CM | POA: Diagnosis not present

## 2016-09-26 DIAGNOSIS — N186 End stage renal disease: Secondary | ICD-10-CM | POA: Diagnosis not present

## 2016-09-26 DIAGNOSIS — D509 Iron deficiency anemia, unspecified: Secondary | ICD-10-CM | POA: Diagnosis not present

## 2016-09-26 DIAGNOSIS — D631 Anemia in chronic kidney disease: Secondary | ICD-10-CM | POA: Diagnosis not present

## 2016-09-29 DIAGNOSIS — N2581 Secondary hyperparathyroidism of renal origin: Secondary | ICD-10-CM | POA: Diagnosis not present

## 2016-09-29 DIAGNOSIS — D631 Anemia in chronic kidney disease: Secondary | ICD-10-CM | POA: Diagnosis not present

## 2016-09-29 DIAGNOSIS — Z992 Dependence on renal dialysis: Secondary | ICD-10-CM | POA: Diagnosis not present

## 2016-09-29 DIAGNOSIS — D509 Iron deficiency anemia, unspecified: Secondary | ICD-10-CM | POA: Diagnosis not present

## 2016-09-29 DIAGNOSIS — N186 End stage renal disease: Secondary | ICD-10-CM | POA: Diagnosis not present

## 2016-10-01 DIAGNOSIS — D509 Iron deficiency anemia, unspecified: Secondary | ICD-10-CM | POA: Diagnosis not present

## 2016-10-01 DIAGNOSIS — Z992 Dependence on renal dialysis: Secondary | ICD-10-CM | POA: Diagnosis not present

## 2016-10-01 DIAGNOSIS — N186 End stage renal disease: Secondary | ICD-10-CM | POA: Diagnosis not present

## 2016-10-01 DIAGNOSIS — D631 Anemia in chronic kidney disease: Secondary | ICD-10-CM | POA: Diagnosis not present

## 2016-10-01 DIAGNOSIS — N2581 Secondary hyperparathyroidism of renal origin: Secondary | ICD-10-CM | POA: Diagnosis not present

## 2016-10-03 DIAGNOSIS — N186 End stage renal disease: Secondary | ICD-10-CM | POA: Diagnosis not present

## 2016-10-03 DIAGNOSIS — D631 Anemia in chronic kidney disease: Secondary | ICD-10-CM | POA: Diagnosis not present

## 2016-10-03 DIAGNOSIS — N2581 Secondary hyperparathyroidism of renal origin: Secondary | ICD-10-CM | POA: Diagnosis not present

## 2016-10-03 DIAGNOSIS — D509 Iron deficiency anemia, unspecified: Secondary | ICD-10-CM | POA: Diagnosis not present

## 2016-10-03 DIAGNOSIS — Z992 Dependence on renal dialysis: Secondary | ICD-10-CM | POA: Diagnosis not present

## 2016-10-06 DIAGNOSIS — N2581 Secondary hyperparathyroidism of renal origin: Secondary | ICD-10-CM | POA: Diagnosis not present

## 2016-10-06 DIAGNOSIS — D509 Iron deficiency anemia, unspecified: Secondary | ICD-10-CM | POA: Diagnosis not present

## 2016-10-06 DIAGNOSIS — Z992 Dependence on renal dialysis: Secondary | ICD-10-CM | POA: Diagnosis not present

## 2016-10-06 DIAGNOSIS — N186 End stage renal disease: Secondary | ICD-10-CM | POA: Diagnosis not present

## 2016-10-06 DIAGNOSIS — D631 Anemia in chronic kidney disease: Secondary | ICD-10-CM | POA: Diagnosis not present

## 2016-10-08 DIAGNOSIS — N186 End stage renal disease: Secondary | ICD-10-CM | POA: Diagnosis not present

## 2016-10-08 DIAGNOSIS — N2581 Secondary hyperparathyroidism of renal origin: Secondary | ICD-10-CM | POA: Diagnosis not present

## 2016-10-08 DIAGNOSIS — D509 Iron deficiency anemia, unspecified: Secondary | ICD-10-CM | POA: Diagnosis not present

## 2016-10-08 DIAGNOSIS — Z992 Dependence on renal dialysis: Secondary | ICD-10-CM | POA: Diagnosis not present

## 2016-10-08 DIAGNOSIS — D631 Anemia in chronic kidney disease: Secondary | ICD-10-CM | POA: Diagnosis not present

## 2016-10-10 DIAGNOSIS — N2581 Secondary hyperparathyroidism of renal origin: Secondary | ICD-10-CM | POA: Diagnosis not present

## 2016-10-10 DIAGNOSIS — D509 Iron deficiency anemia, unspecified: Secondary | ICD-10-CM | POA: Diagnosis not present

## 2016-10-10 DIAGNOSIS — D631 Anemia in chronic kidney disease: Secondary | ICD-10-CM | POA: Diagnosis not present

## 2016-10-10 DIAGNOSIS — N186 End stage renal disease: Secondary | ICD-10-CM | POA: Diagnosis not present

## 2016-10-10 DIAGNOSIS — Z992 Dependence on renal dialysis: Secondary | ICD-10-CM | POA: Diagnosis not present

## 2016-10-13 DIAGNOSIS — N186 End stage renal disease: Secondary | ICD-10-CM | POA: Diagnosis not present

## 2016-10-13 DIAGNOSIS — N2581 Secondary hyperparathyroidism of renal origin: Secondary | ICD-10-CM | POA: Diagnosis not present

## 2016-10-13 DIAGNOSIS — D631 Anemia in chronic kidney disease: Secondary | ICD-10-CM | POA: Diagnosis not present

## 2016-10-13 DIAGNOSIS — Z992 Dependence on renal dialysis: Secondary | ICD-10-CM | POA: Diagnosis not present

## 2016-10-13 DIAGNOSIS — D509 Iron deficiency anemia, unspecified: Secondary | ICD-10-CM | POA: Diagnosis not present

## 2016-10-14 DIAGNOSIS — Z992 Dependence on renal dialysis: Secondary | ICD-10-CM | POA: Diagnosis not present

## 2016-10-14 DIAGNOSIS — N186 End stage renal disease: Secondary | ICD-10-CM | POA: Diagnosis not present

## 2016-10-15 DIAGNOSIS — N186 End stage renal disease: Secondary | ICD-10-CM | POA: Diagnosis not present

## 2016-10-15 DIAGNOSIS — D509 Iron deficiency anemia, unspecified: Secondary | ICD-10-CM | POA: Diagnosis not present

## 2016-10-15 DIAGNOSIS — N2581 Secondary hyperparathyroidism of renal origin: Secondary | ICD-10-CM | POA: Diagnosis not present

## 2016-10-15 DIAGNOSIS — Z992 Dependence on renal dialysis: Secondary | ICD-10-CM | POA: Diagnosis not present

## 2016-10-15 DIAGNOSIS — D631 Anemia in chronic kidney disease: Secondary | ICD-10-CM | POA: Diagnosis not present

## 2016-10-16 ENCOUNTER — Other Ambulatory Visit (INDEPENDENT_AMBULATORY_CARE_PROVIDER_SITE_OTHER): Payer: Self-pay

## 2016-10-16 ENCOUNTER — Encounter (INDEPENDENT_AMBULATORY_CARE_PROVIDER_SITE_OTHER): Payer: Self-pay

## 2016-10-17 DIAGNOSIS — D509 Iron deficiency anemia, unspecified: Secondary | ICD-10-CM | POA: Diagnosis not present

## 2016-10-17 DIAGNOSIS — D631 Anemia in chronic kidney disease: Secondary | ICD-10-CM | POA: Diagnosis not present

## 2016-10-17 DIAGNOSIS — Z992 Dependence on renal dialysis: Secondary | ICD-10-CM | POA: Diagnosis not present

## 2016-10-17 DIAGNOSIS — N2581 Secondary hyperparathyroidism of renal origin: Secondary | ICD-10-CM | POA: Diagnosis not present

## 2016-10-17 DIAGNOSIS — N186 End stage renal disease: Secondary | ICD-10-CM | POA: Diagnosis not present

## 2016-10-20 DIAGNOSIS — Z992 Dependence on renal dialysis: Secondary | ICD-10-CM | POA: Diagnosis not present

## 2016-10-20 DIAGNOSIS — D509 Iron deficiency anemia, unspecified: Secondary | ICD-10-CM | POA: Diagnosis not present

## 2016-10-20 DIAGNOSIS — N2581 Secondary hyperparathyroidism of renal origin: Secondary | ICD-10-CM | POA: Diagnosis not present

## 2016-10-20 DIAGNOSIS — N186 End stage renal disease: Secondary | ICD-10-CM | POA: Diagnosis not present

## 2016-10-20 DIAGNOSIS — D631 Anemia in chronic kidney disease: Secondary | ICD-10-CM | POA: Diagnosis not present

## 2016-10-22 DIAGNOSIS — D509 Iron deficiency anemia, unspecified: Secondary | ICD-10-CM | POA: Diagnosis not present

## 2016-10-22 DIAGNOSIS — Z992 Dependence on renal dialysis: Secondary | ICD-10-CM | POA: Diagnosis not present

## 2016-10-22 DIAGNOSIS — N2581 Secondary hyperparathyroidism of renal origin: Secondary | ICD-10-CM | POA: Diagnosis not present

## 2016-10-22 DIAGNOSIS — N186 End stage renal disease: Secondary | ICD-10-CM | POA: Diagnosis not present

## 2016-10-22 DIAGNOSIS — D631 Anemia in chronic kidney disease: Secondary | ICD-10-CM | POA: Diagnosis not present

## 2016-10-23 ENCOUNTER — Ambulatory Visit
Admission: RE | Disposition: A | Discharge status: Skilled Nursing Facility | Payer: Self-pay | Source: Ambulatory Visit | Attending: Internal Medicine

## 2016-10-23 ENCOUNTER — Encounter: Payer: Self-pay | Admitting: *Deleted

## 2016-10-23 ENCOUNTER — Observation Stay
Admission: RE | Admit: 2016-10-23 | Discharge: 2016-10-26 | Disposition: A | Discharge status: Skilled Nursing Facility | Payer: Medicare Other | Source: Ambulatory Visit | Attending: Internal Medicine | Admitting: Internal Medicine

## 2016-10-23 ENCOUNTER — Other Ambulatory Visit: Payer: Self-pay

## 2016-10-23 DIAGNOSIS — E1122 Type 2 diabetes mellitus with diabetic chronic kidney disease: Secondary | ICD-10-CM | POA: Diagnosis not present

## 2016-10-23 DIAGNOSIS — E119 Type 2 diabetes mellitus without complications: Secondary | ICD-10-CM | POA: Diagnosis not present

## 2016-10-23 DIAGNOSIS — Z8673 Personal history of transient ischemic attack (TIA), and cerebral infarction without residual deficits: Secondary | ICD-10-CM | POA: Diagnosis not present

## 2016-10-23 DIAGNOSIS — N186 End stage renal disease: Secondary | ICD-10-CM | POA: Diagnosis not present

## 2016-10-23 DIAGNOSIS — N2581 Secondary hyperparathyroidism of renal origin: Secondary | ICD-10-CM | POA: Insufficient documentation

## 2016-10-23 DIAGNOSIS — Y832 Surgical operation with anastomosis, bypass or graft as the cause of abnormal reaction of the patient, or of later complication, without mention of misadventure at the time of the procedure: Secondary | ICD-10-CM | POA: Insufficient documentation

## 2016-10-23 DIAGNOSIS — F17210 Nicotine dependence, cigarettes, uncomplicated: Secondary | ICD-10-CM | POA: Diagnosis not present

## 2016-10-23 DIAGNOSIS — T82858A Stenosis of vascular prosthetic devices, implants and grafts, initial encounter: Principal | ICD-10-CM | POA: Insufficient documentation

## 2016-10-23 DIAGNOSIS — T82868A Thrombosis of vascular prosthetic devices, implants and grafts, initial encounter: Secondary | ICD-10-CM | POA: Diagnosis not present

## 2016-10-23 DIAGNOSIS — M329 Systemic lupus erythematosus, unspecified: Secondary | ICD-10-CM | POA: Insufficient documentation

## 2016-10-23 DIAGNOSIS — E876 Hypokalemia: Secondary | ICD-10-CM | POA: Insufficient documentation

## 2016-10-23 DIAGNOSIS — I10 Essential (primary) hypertension: Secondary | ICD-10-CM | POA: Diagnosis not present

## 2016-10-23 DIAGNOSIS — T82590A Other mechanical complication of surgically created arteriovenous fistula, initial encounter: Secondary | ICD-10-CM | POA: Diagnosis not present

## 2016-10-23 DIAGNOSIS — E875 Hyperkalemia: Secondary | ICD-10-CM | POA: Diagnosis present

## 2016-10-23 DIAGNOSIS — I120 Hypertensive chronic kidney disease with stage 5 chronic kidney disease or end stage renal disease: Secondary | ICD-10-CM | POA: Insufficient documentation

## 2016-10-23 DIAGNOSIS — F329 Major depressive disorder, single episode, unspecified: Secondary | ICD-10-CM | POA: Insufficient documentation

## 2016-10-23 DIAGNOSIS — Z992 Dependence on renal dialysis: Secondary | ICD-10-CM | POA: Insufficient documentation

## 2016-10-23 HISTORY — PX: DIALYSIS/PERMA CATHETER INSERTION: CATH118288

## 2016-10-23 LAB — CBC
HEMATOCRIT: 39.6 % (ref 35.0–47.0)
HEMOGLOBIN: 13.3 g/dL (ref 12.0–16.0)
MCH: 30.4 pg (ref 26.0–34.0)
MCHC: 33.5 g/dL (ref 32.0–36.0)
MCV: 90.7 fL (ref 80.0–100.0)
Platelets: 247 10*3/uL (ref 150–440)
RBC: 4.37 MIL/uL (ref 3.80–5.20)
RDW: 13.9 % (ref 11.5–14.5)
WBC: 11.5 10*3/uL — AB (ref 3.6–11.0)

## 2016-10-23 LAB — CREATININE, SERUM
CREATININE: 12.41 mg/dL — AB (ref 0.44–1.00)
GFR, EST AFRICAN AMERICAN: 4 mL/min — AB (ref >60)
GFR, EST NON AFRICAN AMERICAN: 3 mL/min — AB (ref >60)

## 2016-10-23 LAB — PHOSPHORUS: Phosphorus: 4.3 mg/dL (ref 2.5–4.6)

## 2016-10-23 LAB — POTASSIUM
POTASSIUM: 2.9 mmol/L — AB (ref 3.5–5.1)
Potassium: 6.9 mmol/L (ref 3.5–5.1)

## 2016-10-23 LAB — POTASSIUM (ARMC VASCULAR LAB ONLY): Potassium (ARMC vascular lab): 6.7 (ref 3.5–5.1)

## 2016-10-23 LAB — MRSA PCR SCREENING: MRSA BY PCR: NEGATIVE

## 2016-10-23 SURGERY — DIALYSIS/PERMA CATHETER INSERTION

## 2016-10-23 MED ORDER — SODIUM CHLORIDE 0.9 % IV SOLN: INTRAVENOUS | Status: DC

## 2016-10-23 MED ORDER — HYDROCODONE-ACETAMINOPHEN 5-325 MG PO TABS
2.0000 | ORAL_TABLET | ORAL | Status: DC | PRN
  Administered 2016-10-23: 2 via ORAL
  Filled 2016-10-23: qty 2

## 2016-10-23 MED ORDER — SIMVASTATIN 20 MG PO TABS
40.0000 mg | ORAL_TABLET | Freq: Every day | ORAL | Status: DC
  Administered 2016-10-24 – 2016-10-26 (×3): 40 mg via ORAL
  Filled 2016-10-23: qty 2
  Filled 2016-10-23: qty 1
  Filled 2016-10-23 (×2): qty 2

## 2016-10-23 MED ORDER — ALTEPLASE 2 MG IJ SOLR: 2.0000 mg | Freq: Once | INTRAMUSCULAR | Status: DC | PRN

## 2016-10-23 MED ORDER — DOCUSATE SODIUM 100 MG PO CAPS
100.0000 mg | ORAL_CAPSULE | Freq: Every day | ORAL | Status: DC
  Administered 2016-10-24 – 2016-10-26 (×3): 100 mg via ORAL
  Filled 2016-10-23 (×3): qty 1

## 2016-10-23 MED ORDER — ONDANSETRON HCL 4 MG/2ML IJ SOLN
4.0000 mg | Freq: Four times a day (QID) | INTRAMUSCULAR | Status: DC | PRN
  Filled 2016-10-23: qty 2

## 2016-10-23 MED ORDER — PHENYTOIN SODIUM EXTENDED 100 MG PO CAPS
200.0000 mg | ORAL_CAPSULE | Freq: Two times a day (BID) | ORAL | Status: DC
  Administered 2016-10-24 – 2016-10-26 (×4): 200 mg via ORAL
  Filled 2016-10-23 (×3): qty 2

## 2016-10-23 MED ORDER — LIDOCAINE-PRILOCAINE 2.5-2.5 % EX CREA: 1.0000 "application " | TOPICAL_CREAM | CUTANEOUS | Status: DC | PRN

## 2016-10-23 MED ORDER — LIDOCAINE HCL (PF) 1 % IJ SOLN
5.0000 mL | INTRAMUSCULAR | Status: DC | PRN
  Filled 2016-10-23: qty 5

## 2016-10-23 MED ORDER — SERTRALINE HCL 100 MG PO TABS: 200.0000 mg | ORAL_TABLET | Freq: Two times a day (BID) | ORAL | Status: DC

## 2016-10-23 MED ORDER — SODIUM CHLORIDE 0.9 % IV SOLN: 100.0000 mL | INTRAVENOUS | Status: DC | PRN

## 2016-10-23 MED ORDER — RENA-VITE PO TABS
1.0000 | ORAL_TABLET | Freq: Every day | ORAL | Status: DC
  Administered 2016-10-24: 1 via ORAL
  Filled 2016-10-23: qty 1

## 2016-10-23 MED ORDER — GABAPENTIN 100 MG PO CAPS
100.0000 mg | ORAL_CAPSULE | Freq: Every day | ORAL | Status: DC
  Administered 2016-10-24 – 2016-10-26 (×3): 100 mg via ORAL
  Filled 2016-10-23 (×5): qty 1

## 2016-10-23 MED ORDER — SEVELAMER CARBONATE 800 MG PO TABS
800.0000 mg | ORAL_TABLET | Freq: Four times a day (QID) | ORAL | Status: DC
  Administered 2016-10-23 – 2016-10-24 (×2): 800 mg via ORAL
  Administered 2016-10-25: 1600 mg via ORAL
  Administered 2016-10-25 (×2): 800 mg via ORAL
  Administered 2016-10-26 (×2): 1600 mg via ORAL
  Filled 2016-10-23: qty 1
  Filled 2016-10-23 (×2): qty 2
  Filled 2016-10-23 (×2): qty 1
  Filled 2016-10-23: qty 2
  Filled 2016-10-23: qty 1

## 2016-10-23 MED ORDER — PENTAFLUOROPROP-TETRAFLUOROETH EX AERO
1.0000 "application " | INHALATION_SPRAY | CUTANEOUS | Status: DC | PRN
  Filled 2016-10-23: qty 30

## 2016-10-23 MED ORDER — HEPARIN SODIUM (PORCINE) 1000 UNIT/ML DIALYSIS
1000.0000 [IU] | INTRAMUSCULAR | Status: DC | PRN
  Filled 2016-10-23: qty 1

## 2016-10-23 MED ORDER — MIRTAZAPINE 15 MG PO TABS
15.0000 mg | ORAL_TABLET | Freq: Every day | ORAL | Status: DC
  Administered 2016-10-23 – 2016-10-25 (×3): 15 mg via ORAL
  Filled 2016-10-23 (×3): qty 1

## 2016-10-23 MED ORDER — HEPARIN SODIUM (PORCINE) 5000 UNIT/ML IJ SOLN
5000.0000 [IU] | Freq: Three times a day (TID) | INTRAMUSCULAR | Status: DC
  Administered 2016-10-23 – 2016-10-26 (×5): 5000 [IU] via SUBCUTANEOUS
  Filled 2016-10-23 (×5): qty 1

## 2016-10-23 MED ORDER — CEFAZOLIN SODIUM-DEXTROSE 1-4 GM/50ML-% IV SOLN
INTRAVENOUS | Status: AC
  Filled 2016-10-23: qty 50

## 2016-10-23 MED ORDER — CEFAZOLIN SODIUM-DEXTROSE 1-4 GM/50ML-% IV SOLN
1.0000 g | Freq: Once | INTRAVENOUS | Status: DC
  Administered 2016-10-24: 16:00:00 via INTRAVENOUS

## 2016-10-23 MED ORDER — POLYETHYLENE GLYCOL 3350 17 G PO PACK
17.0000 g | PACK | Freq: Every day | ORAL | Status: DC | PRN
Start: 2016-10-23 — End: 2016-10-26

## 2016-10-23 MED ORDER — PANTOPRAZOLE SODIUM 40 MG PO TBEC
40.0000 mg | DELAYED_RELEASE_TABLET | Freq: Every day | ORAL | Status: DC
  Administered 2016-10-24 – 2016-10-26 (×3): 40 mg via ORAL
  Filled 2016-10-23 (×5): qty 1

## 2016-10-23 MED ORDER — ACETAMINOPHEN 650 MG RE SUPP: 650.0000 mg | Freq: Four times a day (QID) | RECTAL | Status: DC | PRN

## 2016-10-23 MED ORDER — IPRATROPIUM-ALBUTEROL 0.5-2.5 (3) MG/3ML IN SOLN: 3.0000 mL | RESPIRATORY_TRACT | Status: DC | PRN

## 2016-10-23 MED ORDER — ALBUTEROL SULFATE (2.5 MG/3ML) 0.083% IN NEBU
2.5000 mg | INHALATION_SOLUTION | Freq: Four times a day (QID) | RESPIRATORY_TRACT | Status: DC | PRN
  Filled 2016-10-23: qty 3

## 2016-10-23 MED ORDER — ONDANSETRON HCL 4 MG PO TABS
4.0000 mg | ORAL_TABLET | Freq: Four times a day (QID) | ORAL | Status: DC | PRN
  Filled 2016-10-23: qty 1

## 2016-10-23 MED ORDER — SERTRALINE HCL 50 MG PO TABS
200.0000 mg | ORAL_TABLET | Freq: Every day | ORAL | Status: DC
  Administered 2016-10-24 – 2016-10-26 (×3): 200 mg via ORAL
  Filled 2016-10-23 (×3): qty 4

## 2016-10-23 MED ORDER — ACETAMINOPHEN 325 MG PO TABS: 650.0000 mg | ORAL_TABLET | Freq: Four times a day (QID) | ORAL | Status: DC | PRN

## 2016-10-23 MED ORDER — LIDOCAINE-EPINEPHRINE (PF) 1 %-1:200000 IJ SOLN
INTRAMUSCULAR | Status: DC | PRN
  Administered 2016-10-23: 10 mL

## 2016-10-23 MED ORDER — GUAIFENESIN 100 MG/5ML PO SOLN: 200.0000 mg | Freq: Three times a day (TID) | ORAL | Status: DC | PRN

## 2016-10-23 MED ORDER — ALPRAZOLAM 0.25 MG PO TABS: 0.2500 mg | ORAL_TABLET | Freq: Two times a day (BID) | ORAL | Status: DC | PRN

## 2016-10-23 MED ORDER — MIDODRINE HCL 5 MG PO TABS
10.0000 mg | ORAL_TABLET | ORAL | Status: DC
  Administered 2016-10-24: 10 mg via ORAL
  Filled 2016-10-23 (×2): qty 2

## 2016-10-23 MED ORDER — HYDROXYCHLOROQUINE SULFATE 200 MG PO TABS
200.0000 mg | ORAL_TABLET | Freq: Every day | ORAL | Status: DC
  Administered 2016-10-24 – 2016-10-26 (×3): 200 mg via ORAL
  Filled 2016-10-23 (×3): qty 1

## 2016-10-23 MED ORDER — ONDANSETRON HCL 8 MG PO TABS: 8.0000 mg | ORAL_TABLET | ORAL | Status: DC | PRN

## 2016-10-23 SURGICAL SUPPLY — 3 items
COVER PROBE U/S 5X48 (MISCELLANEOUS) ×2 IMPLANT
KIT DIALYSIS CATH TRI 30X13 (CATHETERS) ×2 IMPLANT
PACK ANGIOGRAPHY (CUSTOM PROCEDURE TRAY) ×3 IMPLANT

## 2016-10-23 NOTE — Progress Notes (Signed)
HD vitals

## 2016-10-23 NOTE — Progress Notes (Signed)
Pre hd assessment  

## 2016-10-23 NOTE — H&P (Addendum)
[facility] Admission History & Physical  MRN : [id number]  [name] is a 41 y.o. (Jun 21, 1975) female who presents with chief complaint of No chief complaint on file. Marland Kitchen  History of Present Illness: I am asked to evaluate the patient by the dialysis center. The patient was sent here because they were unable to achieve adequate dialysis this morning. Furthermore the Center states there is very poor thrill and bruit. The patient states there there have been increasing problems with the access, such as "pulling clots" during dialysis and prolonged bleeding after decannulation. The patient estimates these problems have been going on for several weeks. The patient is unaware of any other change.  Patient denies pain or tenderness overlying the access.  There is no pain with dialysis.  The patient denies hand pain or finger pain consistent with steal syndrome.   There have been several past interventions or declots of this access.  The patient is not chronically hypotensive on dialysis.  Current Facility-Administered Medications  Medication Dose Route Frequency Provider Last Rate Last Dose  . ceFAZolin (ANCEF) IVPB 1 g/50 mL premix  1 g Intravenous Once Schnier, Dolores Lory, MD       Facility-Administered Medications Ordered in Other Encounters  Medication Dose Route Frequency Provider Last Rate Last Dose  . 0.9 %  sodium chloride infusion   Intravenous Once Monia Sabal, PA-C      . ceFAZolin (ANCEF) IVPB 2 g/50 mL premix  2 g Intravenous Once Monia Sabal, PA-C        Past Medical History:  Diagnosis Date  . Anemia   . BV (bacterial vaginosis) 11/08/2013  . Depression   . Dialysis patient ([place])   . DVT (deep venous thrombosis) ([place])   . Hypertension   . Lupus   . Renal disorder   . Vaginal odor 11/08/2013    Past Surgical History:  Procedure Laterality Date  . AV FISTULA PLACEMENT    . PERIPHERAL VASCULAR CATHETERIZATION Left 01/04/2015    Procedure: A/V Shuntogram/Fistulagram;  Surgeon: Algernon Huxley, MD;  Location: [facility];  Service: Cardiovascular;  Laterality: Left;  . PERIPHERAL VASCULAR CATHETERIZATION N/A 01/04/2015   Procedure: A/V Shunt Intervention;  Surgeon: Algernon Huxley, MD;  Location: [facility];  Service: Cardiovascular;  Laterality: N/A;  . PERIPHERAL VASCULAR CATHETERIZATION Left 04/13/2015   Procedure: A/V Shuntogram/Fistulagram;  Surgeon: Katha Cabal, MD;  Location: [facility];  Service: Cardiovascular;  Laterality: Left;  . PERIPHERAL VASCULAR CATHETERIZATION Left 04/13/2015   Procedure: A/V Shunt Intervention;  Surgeon: Katha Cabal, MD;  Location: [facility];  Service: Cardiovascular;  Laterality: Left;    Social History Social History  Substance Use Topics  . Smoking status: Current Every Day Smoker    Packs/day: 0.00    Years: 15.00    Types: Cigarettes  . Smokeless tobacco: Never Used     Comment: smokes 2 cig daily  . Alcohol use No    Family History Family History  Problem Relation Age of Onset  . Seizures Son   . Kidney disease Mother        on dialysis  . Lupus Mother     No family history of bleeding or clotting disorders, autoimmune disease or porphyria  Allergies  Allergen Reactions  . Dust Mite Extract Other (See Comments)    sneezing     REVIEW OF SYSTEMS (Negative unless checked)  Constitutional: [] Weight loss  [] Fever  [] Chills  Cardiac: [] Chest pain   [] Chest pressure   [] Palpitations   [] Shortness of breath when laying flat   [] Shortness of breath at rest   [x] Shortness of breath with exertion. Vascular:  [] Pain in legs with walking   [] Pain in legs at rest   [] Pain in legs when laying flat   [] Claudication   [] Pain in feet when walking  [] Pain in feet at rest  [] Pain in feet when laying flat   [] History of DVT   [] Phlebitis   [] Swelling in legs   [] Varicose veins   [] Non-healing ulcers Pulmonary:   [] Uses home oxygen    [] Productive cough   [] Hemoptysis   [] Wheeze  [] COPD   [] Asthma Neurologic:  [] Dizziness  [] Blackouts   [] Seizures   [] History of stroke   [] History of TIA  [] Aphasia   [] Temporary blindness   [] Dysphagia   [] Weakness or numbness in arms   [] Weakness or numbness in legs Musculoskeletal:  [] Arthritis   [] Joint swelling   [] Joint pain   [] Low back pain Hematologic:  [] Easy bruising  [] Easy bleeding   [] Hypercoagulable state   [] Anemic  [] Hepatitis Gastrointestinal:  [] Blood in stool   [] Vomiting blood  [] Gastroesophageal reflux/heartburn   [] Difficulty swallowing. Genitourinary:  [x] Chronic kidney disease   [] Difficult urination  [] Frequent urination  [] Burning with urination   [] Blood in urine Skin:  [] Rashes   [] Ulcers   [] Wounds Psychological:  [] History of anxiety   []  History of major depression.  Physical Examination  There were no vitals filed for this visit. There is no height or weight on file to calculate BMI. Gen: WD/WN, NAD Head: [place]/AT, No temporalis wasting. Prominent temp pulse not noted. Ear/Nose/Throat: Hearing grossly intact, nares w/o erythema or drainage, oropharynx w/o Erythema/Exudate,  Eyes: Conjunctiva clear, sclera non-icteric Neck: Trachea midline.  No JVD.  Pulmonary:  Good air movement, respirations not labored, no use of accessory muscles.  Cardiac: RRR, normal S1, S2. Vascular: pulsatile graft left arm Vessel Right Left  Radial Palpable Palpable  Ulnar Not Palpable Not Palpable  Brachial Palpable Palpable  Carotid Palpable, without bruit Palpable, without bruit   Musculoskeletal: M/S 5/5 throughout.  Extremities without ischemic changes.  No deformity or atrophy.  Neurologic: Sensation grossly intact in extremities.  Symmetrical.  Speech is fluent. Motor exam as listed above. Psychiatric: Judgment intact, Mood & affect appropriate for pt's clinical situation. Dermatologic: No rashes or ulcers noted.  No cellulitis or open wounds.    CBC Lab Results   Component Value Date   WBC 10.8 (H) 06/17/2013   HGB 11.0 (L) 06/17/2013   HCT 31.7 (L) 06/17/2013   MCV 83.2 06/17/2013   PLT 306 06/17/2013    BMET    Component Value Date/Time   NA 139 06/17/2013 1210   NA 137 02/18/2013 0440   K 5.6 (H) 04/13/2015 1221   K 4.9 07/06/2014 1308   CL 95 (L) 06/17/2013 1210   CL 100 02/18/2013 0440   CO2 28 06/17/2013 1210   CO2 33 (H) 02/18/2013 0440   GLUCOSE 84 06/17/2013 1210   GLUCOSE 73 02/18/2013 0440   BUN 33 (H) 06/17/2013 1210   BUN 9 02/18/2013 0440   CREATININE 5.71 (H) 06/17/2013 1210   CREATININE 1.93 (H) 02/18/2013 0440   CALCIUM 9.3 06/17/2013 1210   CALCIUM 8.2 (L) 02/18/2013 0440   CALCIUM (LL) 04/08/2007 1300    6.1 Result repeated and verified. CRITICAL VALUE NOTED.  VALUE IS CONSISTENT WITH PREVIOUSLY REPORTED AND CALLED VALUE. CRITICAL RESULT CALLED  TO, READ BACK BY AND VERIFIED WITH: MENIGOSTO,A RN [phone] [id number] [place] S.   GFRNONAA 9 (L) 06/17/2013 1210   GFRNONAA 32 (L) 02/18/2013 0440   GFRAA 10 (L) 06/17/2013 1210   GFRAA 37 (L) 02/18/2013 0440   CrCl cannot be calculated (Patient's most recent lab result is older than the maximum 21 days allowed.).  COAG Lab Results  Component Value Date   INR 1.1 10/06/2008   INR 1.6 (H) 02/11/2008   INR 1.3 02/07/2008    Radiology No results found.  Assessment/Plan 1.  Complication dialysis device with thrombosis AV access:  Patient's left arm dialysis access is malfunctioning. The patient will undergo angiography and correction of any problems using interventional techniques with the hope of restoring function to the access.  The risks and benefits were described to the patient.  All questions were answered.  The patient agrees to proceed with angiography and intervention. Potassium will be drawn to ensure that it is an appropriate level prior to performing intervention. 2.  End-stage renal disease requiring hemodialysis:  Patient will continue dialysis therapy without  further interruption if a successful intervention is not achieved then a tunneled catheter will be placed. Dialysis has already been arranged. 3.  Hypertension:  Patient will continue medical management; nephrology is following no changes in oral medications. 4. Diabetes mellitus:  Glucose will be monitored and oral medications been held this morning once the patient has undergone the patient's procedure po intake will be reinitiated and again Accu-Cheks will be used to assess the blood glucose level and treat as needed. The patient will be restarted on the patient's usual hypoglycemic regime     Leotis Pain, MD  10/23/2016 10:10 AM  The patient was found to have profound hyperkalemia and a non-functional access today. Her K was too high for treatment of the shunt, so a temporary dialysis catheter was placed and she will need an intervention at a later date.

## 2016-10-23 NOTE — Progress Notes (Signed)
Post hd vitals 

## 2016-10-23 NOTE — Progress Notes (Signed)
Unable to cannulate hero access. Negative for thrill and flash. Pt returning to special procedures for temp cath placement.

## 2016-10-23 NOTE — Progress Notes (Signed)
Pre hd info, pt right femoral cvc, post special procedures

## 2016-10-23 NOTE — Progress Notes (Signed)
Hd start, pt alert, no c/o, stable, right temp cvc used, post special procedures

## 2016-10-23 NOTE — Progress Notes (Signed)
Temp dialysis catheter inserted into right groin. Patient tolerated well. Patient returned downstairs for dialysis treatment. RN on 2A notified of temp cath.

## 2016-10-23 NOTE — H&P (Signed)
[facility] [place] NAME: [name]    MR#:  [id number]  DATE OF BIRTH:  03/28/1975  DATE OF ADMISSION:  10/23/2016  PRIMARY CARE PHYSICIAN: Sinda Du, MD   REQUESTING/REFERRING PHYSICIAN: Dr Lucky Cowboy  CHIEF COMPLAINT:   Elevated potassium of 6.9 HISTORY OF PRESENT ILLNESS:  [name]  is a 41 y.o. female with a known history ofEnd-stage renal disease on hemodialysis, history of lupus, hypertension presented to specialists recovery for evaluation of a malfunctioning dialysis access and preop labs found potassium of 6.9. No acute EKG changes. Internal medicine was consulted and patient is being admitted for acute hyperkalemia.  PAST MEDICAL HISTORY:   Past Medical History:  Diagnosis Date  . Anemia   . BV (bacterial vaginosis) 11/08/2013  . Depression   . Dialysis patient ([place])   . DVT (deep venous thrombosis) ([place])   . Hypertension   . Lupus   . Renal disorder   . Vaginal odor 11/08/2013    PAST SURGICAL HISTOIRY:   Past Surgical History:  Procedure Laterality Date  . AV FISTULA PLACEMENT    . PERIPHERAL VASCULAR CATHETERIZATION Left 01/04/2015   Procedure: A/V Shuntogram/Fistulagram;  Surgeon: Algernon Huxley, MD;  Location: [facility];  Service: Cardiovascular;  Laterality: Left;  . PERIPHERAL VASCULAR CATHETERIZATION N/A 01/04/2015   Procedure: A/V Shunt Intervention;  Surgeon: Algernon Huxley, MD;  Location: [facility];  Service: Cardiovascular;  Laterality: N/A;  . PERIPHERAL VASCULAR CATHETERIZATION Left 04/13/2015   Procedure: A/V Shuntogram/Fistulagram;  Surgeon: Katha Cabal, MD;  Location: [facility];  Service: Cardiovascular;  Laterality: Left;  . PERIPHERAL VASCULAR CATHETERIZATION Left 04/13/2015   Procedure: A/V Shunt Intervention;  Surgeon: Katha Cabal, MD;  Location: [facility];  Service: Cardiovascular;  Laterality: Left;    SOCIAL HISTORY:   Social  History  Substance Use Topics  . Smoking status: Current Every Day Smoker    Packs/day: 0.00    Years: 15.00    Types: Cigarettes  . Smokeless tobacco: Never Used     Comment: smokes 2 cig daily  . Alcohol use No    FAMILY HISTORY:   Family History  Problem Relation Age of Onset  . Seizures Son   . Kidney disease Mother        on dialysis  . Lupus Mother     DRUG ALLERGIES:   Allergies  Allergen Reactions  . Dust Mite Extract Other (See Comments)    sneezing    REVIEW OF SYSTEMS:  Review of Systems  Constitutional: Negative for chills, fever and weight loss.  HENT: Negative for ear discharge, ear pain and nosebleeds.   Eyes: Negative for blurred vision, pain and discharge.  Respiratory: Negative for sputum production, shortness of breath, wheezing and stridor.   Cardiovascular: Negative for chest pain, palpitations, orthopnea and PND.  Gastrointestinal: Negative for abdominal pain, diarrhea, nausea and vomiting.  Genitourinary: Negative for frequency and urgency.  Musculoskeletal: Negative for back pain and joint pain.  Neurological: Positive for weakness. Negative for sensory change, speech change and focal weakness.  Psychiatric/Behavioral: Negative for depression and hallucinations. The patient is not nervous/anxious.      MEDICATIONS AT HOME:   Prior to Admission medications   Medication Sig Start Date End Date Taking? Authorizing Provider  albuterol (PROVENTIL) (2.5 MG/3ML) 0.083% nebulizer solution Take 2.5 mg by nebulization 4 (four) times daily as needed (breathing).   Yes [provider]  ALPRAZolam (XANAX) 0.25 MG tablet Take 0.25 mg by mouth 2 (two) times daily as needed for anxiety.    Yes [provider]  aspirin EC 81 MG tablet Take 81 mg by mouth daily.   Yes [provider]  chlorhexidine (PERIDEX) 0.12 % solution Use as directed 10 mLs in the mouth or throat 2 (two) times daily.   Yes [provider]  clopidogrel  (PLAVIX) 75 MG tablet Take 75 mg by mouth daily.   Yes [provider]  docusate sodium (COLACE) 100 MG capsule Take 100 mg by mouth daily.   Yes [provider]  gabapentin (NEURONTIN) 100 MG capsule Take 100 mg by mouth.  05/22/10  Yes [provider]  guaiFENesin (ROBITUSSIN) 100 MG/5ML liquid Take 200 mg by mouth 3 (three) times daily as needed for cough.   Yes [provider]  HYDROcodone-acetaminophen (NORCO/VICODIN) 5-325 MG per tablet Take 2 tablets by mouth every 4 (four) hours as needed. 06/17/13  Yes Tanna Furry, MD  hydroxychloroquine (PLAQUENIL) 200 MG tablet Take 200 mg by mouth daily.   Yes [provider]  ipratropium (ATROVENT) 0.02 % nebulizer solution Take 250 mcg by nebulization 4 (four) times daily as needed (breathing).   Yes [provider]  ipratropium-albuterol (DUONEB) 0.5-2.5 (3) MG/3ML SOLN 3 mLs as needed.  03/21/10  Yes [provider]  lidocaine-prilocaine (EMLA) cream Apply 1 application topically as needed (for port access).  12/20/12  Yes [provider]  loratadine (CLARITIN) 10 MG tablet Take 10 mg by mouth daily as needed for allergies.   Yes [provider]  midodrine (PROAMATINE) 10 MG tablet Take 10 mg by mouth 2 (two) times daily. Monday, Wednesday, and Friday before dialysis   Yes [provider]  mirtazapine (REMERON) 15 MG tablet 15 mg at bedtime.  11/03/13  Yes [provider]  multivitamin (RENA-VIT) TABS tablet Take 1 tablet by mouth daily.   Yes [provider]  ondansetron (ZOFRAN) 8 MG tablet Take by mouth as needed for nausea or vomiting.   Yes [provider]  pantoprazole (PROTONIX) 40 MG tablet Take 40 mg by mouth daily.   Yes [provider]  phenytoin (DILANTIN) 100 MG ER capsule Take 200 mg by mouth 2 (two) times daily.    Yes [provider]  polyethylene glycol (MIRALAX / GLYCOLAX) packet Take 17 g by mouth daily as  needed for mild constipation.   Yes [provider]  sertraline (ZOLOFT) 100 MG tablet Take 200 mg by mouth daily.    Yes [provider]  sevelamer carbonate (RENVELA) 800 MG tablet Take 800-1,600 mg by mouth 4 (four) times daily. Takes 1600 mg with each meal and 800 mg with snack   Yes [provider]  simvastatin (ZOCOR) 40 MG tablet Take 40 mg by mouth daily.   Yes [provider]  doxercalciferol (HECTOROL) 4 MCG/2ML injection Inject into the vein.    [provider]      VITAL SIGNS:  Blood pressure 102/73, pulse 63, temperature 97.7 F (36.5 C), temperature source Oral, resp. rate 12, height 5\' 4"  (1.626 m), weight 44 kg (97 lb), SpO2 100 %.  PHYSICAL EXAMINATION:  GENERAL:  42 y.o.-year-old patient lying in the bed with no acute distress.  EYES: Pupils equal, round, reactive to light and accommodation. No scleral icterus. Extraocular muscles intact.  HEENT: Head atraumatic, normocephalic. Oropharynx and nasopharynx clear.  NECK:  Supple, no jugular venous distention.  No thyroid enlargement, no tenderness.  LUNGS: Normal breath sounds bilaterally, no wheezing, rales,rhonchi or crepitation. No use of accessory muscles of respiration.  CARDIOVASCULAR: S1, S2 normal. No murmurs, rubs, or gallops.  ABDOMEN: Soft, nontender, nondistended. Bowel sounds present. No organomegaly or mass.  EXTREMITIES: No pedal edema, cyanosis, or clubbing. Left upper extremity AV fistula NEUROLOGIC: Cranial nerves II through XII are intact. Muscle strength 5/5 in all extremities. Sensation intact. Gait not checked.  PSYCHIATRIC: The patient is alert and oriented x 3.  SKIN: No obvious rash, lesion, or ulcer.   LABORATORY PANEL:   CBC  Recent Labs Lab 10/23/16 1115  WBC 11.5*  HGB 13.3  HCT 39.6  PLT 247   ------------------------------------------------------------------------------------------------------------------  Chemistries   Recent Labs Lab  10/23/16 1115  K 6.9*  CREATININE 12.41*   ------------------------------------------------------------------------------------------------------------------  Cardiac Enzymes No results for input(s): TROPONINI in the last 168 hours. ------------------------------------------------------------------------------------------------------------------  RADIOLOGY:  No results found.  EKG:    IMPRESSION AND PLAN:  [name]  is a 41 y.o. female with a known history ofEnd-stage renal disease on hemodialysis, history of lupus, hypertension presented to specialists recovery for evaluation of a malfunctioning dialysis access and preop labs found potassium of 6.9.  1. Acute hyperkalemia -No EKG changes -Admit patient to medical floor with telemetry monitoring -Urgent nephrotic consultation for dialysis.Dr. Holley Raring aware -Patient will need HD access prior to it  2. End-stage renal disease on hemodialysis -Nephrology consultation placed  3. Hypertension resume home meds  4. Malfunctioning HD access -Vascular surgery to do fistulogram in a.m.  5. DVT prophylaxis subcutaneous  heparin   All the records are reviewed and case discussed with ED provider. Management plans discussed with the patient, family and they are in agreement.  CODE STATUS: full  TOTAL TIME TAKING CARE OF THIS PATIENT: 50 minutes.    Zaryan Yakubov M.D on 10/23/2016 at 2:54 PM  Between 7am to 6pm - Pager - [phone]  After 6pm go to [email or web address] - password [email or web address] [facility]  [facility]  Office  [phone]  CC: Primary care physician; Sinda Du, MD

## 2016-10-23 NOTE — Progress Notes (Signed)
  End of hd 

## 2016-10-23 NOTE — Progress Notes (Signed)
Hemodialysis catheter site bleeding moderate amount of red blood. Gauze and pressure applied to site. MD Schnier on-call for vascular notified of situation. Per MD, if bleeding does not stop after application of pressure, okay to remove HD catheter, apply pressure for 23min, and apply new dressing. Nursing staff will continue to monitor for any changes in patient status. Earleen Reaper, RN

## 2016-10-23 NOTE — Progress Notes (Signed)
10:30  Called legal guardian at [facility] to see if they needed to give consent for a shuntagram.  Had to leave a message with the director of SS.

## 2016-10-23 NOTE — Op Note (Signed)
  OPERATIVE NOTE   PROCEDURE: 1. Ultrasound guidance for vascular access right femoral vein 2. Placement of a 30 cm triple lumen dialysis catheter right femoral vein  PRE-OPERATIVE DIAGNOSIS: 1. ESRD, hyperkalemia precluding shuntogram intervention  POST-OPERATIVE DIAGNOSIS: Same  SURGEON: Leotis Pain, MD  ASSISTANT(S): None  ANESTHESIA: local  ESTIMATED BLOOD LOSS: Minimal   FINDING(S): 1. None  SPECIMEN(S): None  INDICATIONS:  Patient is a 41 y.o.female who presents with renal failure and a poorly functioning left arm he row graft. She was scheduled to have a shuntogram, but her potassium was prohibitively high and so no intervention could be performed on her shunt. She will need a temporary dialysis catheter for urgent dialysis for her hyperkalemia..  Risks and benefits were discussed, and informed consent was obtained..  DESCRIPTION: After obtaining full informed written consent, the patient was laid flat in the bed. The right groin was sterilely prepped and draped in a sterile surgical field was created. The right femoral vein was visualized with ultrasound and found to be widely patent. It was then accessed under direct guidance without difficulty with a Seldinger needle and a permanent image was recorded. A J-wire was then placed. After skin nick and dilatation, a 30 cm triple lumen dialysis catheter was placed over the wire and the wire was removed. The lumens withdrew dark red nonpulsatile blood and flushed easily with sterile saline. The catheter was secured to the skin with 3 nylon sutures. Sterile dressing was placed.  COMPLICATIONS: None  CONDITION: Stable  Leotis Pain 10/23/2016 3:25 PM  This note was created with Dragon Medical transcription system. Any errors in dictation are purely unintentional.

## 2016-10-23 NOTE — Progress Notes (Signed)
Post hd assessment 

## 2016-10-24 ENCOUNTER — Encounter: Payer: Self-pay | Admitting: Vascular Surgery

## 2016-10-24 ENCOUNTER — Ambulatory Visit
Admission: RE | Disposition: A | Discharge status: Skilled Nursing Facility | Payer: Self-pay | Source: Ambulatory Visit | Attending: Internal Medicine

## 2016-10-24 DIAGNOSIS — E875 Hyperkalemia: Secondary | ICD-10-CM | POA: Diagnosis not present

## 2016-10-24 DIAGNOSIS — N186 End stage renal disease: Secondary | ICD-10-CM | POA: Diagnosis not present

## 2016-10-24 DIAGNOSIS — E1122 Type 2 diabetes mellitus with diabetic chronic kidney disease: Secondary | ICD-10-CM | POA: Diagnosis not present

## 2016-10-24 DIAGNOSIS — Z992 Dependence on renal dialysis: Secondary | ICD-10-CM | POA: Diagnosis not present

## 2016-10-24 DIAGNOSIS — T82858A Stenosis of vascular prosthetic devices, implants and grafts, initial encounter: Secondary | ICD-10-CM | POA: Diagnosis not present

## 2016-10-24 DIAGNOSIS — I10 Essential (primary) hypertension: Secondary | ICD-10-CM | POA: Diagnosis not present

## 2016-10-24 DIAGNOSIS — T82590A Other mechanical complication of surgically created arteriovenous fistula, initial encounter: Secondary | ICD-10-CM | POA: Diagnosis not present

## 2016-10-24 DIAGNOSIS — I120 Hypertensive chronic kidney disease with stage 5 chronic kidney disease or end stage renal disease: Secondary | ICD-10-CM | POA: Diagnosis not present

## 2016-10-24 DIAGNOSIS — T82868A Thrombosis of vascular prosthetic devices, implants and grafts, initial encounter: Secondary | ICD-10-CM | POA: Diagnosis not present

## 2016-10-24 DIAGNOSIS — N2581 Secondary hyperparathyroidism of renal origin: Secondary | ICD-10-CM | POA: Diagnosis not present

## 2016-10-24 HISTORY — PX: A/V SHUNT INTERVENTION: CATH118220

## 2016-10-24 LAB — HEPATITIS B SURFACE ANTIGEN: HEP B S AG: NEGATIVE

## 2016-10-24 LAB — HCG, QUANTITATIVE, PREGNANCY: HCG, BETA CHAIN, QUANT, S: 1 m[IU]/mL (ref <5)

## 2016-10-24 LAB — PARATHYROID HORMONE, INTACT (NO CA): PTH: 183 pg/mL — ABNORMAL HIGH (ref 15–65)

## 2016-10-24 LAB — POTASSIUM: POTASSIUM: 5.4 mmol/L — AB (ref 3.5–5.1)

## 2016-10-24 SURGERY — A/V SHUNT INTERVENTION
Anesthesia: Moderate Sedation

## 2016-10-24 MED ORDER — FENTANYL CITRATE (PF) 100 MCG/2ML IJ SOLN
INTRAMUSCULAR | Status: DC | PRN
  Administered 2016-10-24: 50 ug via INTRAVENOUS
  Administered 2016-10-24: 25 ug via INTRAVENOUS

## 2016-10-24 MED ORDER — MIDAZOLAM HCL 2 MG/2ML IJ SOLN
INTRAMUSCULAR | Status: DC | PRN
  Administered 2016-10-24: 1 mg via INTRAVENOUS
  Administered 2016-10-24: 2 mg via INTRAVENOUS

## 2016-10-24 MED ORDER — HEPARIN SODIUM (PORCINE) 1000 UNIT/ML IJ SOLN
INTRAMUSCULAR | Status: DC | PRN
  Administered 2016-10-24: 4000 [IU] via INTRAVENOUS

## 2016-10-24 MED ORDER — HEPARIN (PORCINE) IN NACL 2-0.9 UNIT/ML-% IJ SOLN
INTRAMUSCULAR | Status: AC
  Filled 2016-10-24: qty 1000

## 2016-10-24 MED ORDER — FENTANYL CITRATE (PF) 100 MCG/2ML IJ SOLN
INTRAMUSCULAR | Status: AC
  Filled 2016-10-24: qty 2

## 2016-10-24 MED ORDER — MIDAZOLAM HCL 5 MG/5ML IJ SOLN
INTRAMUSCULAR | Status: AC
  Filled 2016-10-24: qty 5

## 2016-10-24 MED ORDER — IOPAMIDOL (ISOVUE-300) INJECTION 61%
INTRAVENOUS | Status: DC | PRN
  Administered 2016-10-24: 40 mL via INTRAVENOUS

## 2016-10-24 MED ORDER — LIDOCAINE HCL (PF) 1 % IJ SOLN
INTRAMUSCULAR | Status: AC
  Filled 2016-10-24: qty 30

## 2016-10-24 MED ORDER — HEPARIN SODIUM (PORCINE) 1000 UNIT/ML IJ SOLN
INTRAMUSCULAR | Status: AC
  Filled 2016-10-24: qty 1

## 2016-10-24 SURGICAL SUPPLY — 20 items
BALLN DORADO 8X60X80 (BALLOONS) ×2
BALLN LUTONIX DCB 4X40X130 (BALLOONS) ×2
BALLN LUTONIX DCB 5X60X130 (BALLOONS) ×2
BALLOON DORADO 8X60X80 (BALLOONS) IMPLANT
BALLOON LUTONIX DCB 4X40X130 (BALLOONS) IMPLANT
BALLOON LUTONIX DCB 5X60X130 (BALLOONS) IMPLANT
CATH BEACON 5 .035 40 KMP TP (CATHETERS) IMPLANT
CATH BEACON 5 .038 40 KMP TP (CATHETERS) ×1
DEVICE PRESTO INFLATION (MISCELLANEOUS) ×3 IMPLANT
DRAPE BRACHIAL (DRAPES) ×1 IMPLANT
NDL ENTRY 21GA 7CM ECHOTIP (NEEDLE) IMPLANT
NEEDLE ENTRY 21GA 7CM ECHOTIP (NEEDLE) ×2 IMPLANT
PACK ANGIOGRAPHY (CUSTOM PROCEDURE TRAY) ×1 IMPLANT
SET INTRO CAPELLA COAXIAL (SET/KITS/TRAYS/PACK) ×1 IMPLANT
SHEATH BRITE TIP 6FRX5.5 (SHEATH) ×2 IMPLANT
SHIELD X-DRAPE GOLD 12X17 (MISCELLANEOUS) ×1 IMPLANT
SUT MNCRL AB 4-0 PS2 18 (SUTURE) ×1 IMPLANT
TOWEL OR 17X26 4PK STRL BLUE (TOWEL DISPOSABLE) ×2 IMPLANT
WIRE MAGIC TOR.035 180C (WIRE) ×2 IMPLANT
WIRE MAGIC TORQUE 260C (WIRE) ×1 IMPLANT

## 2016-10-24 NOTE — Care Management (Addendum)
Screen due to ESRD and chronic HD.  Patient  is followed by [facility] in [place] on [address].   She is a resident in an assisted living facility in [place].  Patient does not provide the name of the facility when asked by CM x 2.  It is documented in "FYI  that [name] is patient's legal guardian. Updated CSW that patient is from a facility.  She was sent from her dialysis center for vascular evaluation due to complication with her dialysis fistula.  Was to have angiogram but found that her potassium was elevated at 6.9.  Required urgent nephrology consult and temporary dialysis cath insertion right groin. After dialysis, patient's potassium was 2.9.  She began to have uncontrollable bleeding from the temporary dialysis catheter site and it had to be removed. Anticipate fistulogram today.  CM was informed a Code 84 was required  on this patient and had patient sign observation notice before realized patient had a legal guardian.  Contacted [place] DSS contact information provided and informed  the contact person in Epic is incorrect.  Provided with up to date contact- Melinda Spell- [phone].  She is on vacation today but will be back Monday. Explained and faxed the Code 44 Obs Notice to [phone]. Received confirmation of fax receipt.

## 2016-10-24 NOTE — Care Management CC44 (Signed)
Condition Code 44 Documentation Completed  Patient Details  Name: [name] MRN: [id number] Date of Birth: 09-29-1975   Condition Code 44 given:  Yes Patient signature on Condition Code 44 notice:  Yes Documentation of 2 MD's agreement:  Yes Code 44 added to claim:  Yes    Katrina Stack, RN 10/24/2016, 3:07 PM

## 2016-10-24 NOTE — Progress Notes (Signed)
Patient HD cath site still bleeding DR LATEEF made aware and pressure dressing apply , will continue to monitor

## 2016-10-24 NOTE — Progress Notes (Signed)
[facility] at [place] NAME: [name]    MR#:  [id number]  DATE OF BIRTH:  1975/09/24  SUBJECTIVE:  CHIEF COMPLAINT:  No chief complaint on file. No complaints, laying in the bed REVIEW OF SYSTEMS:  Review of Systems  Constitutional: Negative for chills, fever and weight loss.  HENT: Negative for nosebleeds and sore throat.   Eyes: Negative for blurred vision.  Respiratory: Negative for cough, shortness of breath and wheezing.   Cardiovascular: Negative for chest pain, orthopnea, leg swelling and PND.  Gastrointestinal: Negative for abdominal pain, constipation, diarrhea, heartburn, nausea and vomiting.  Genitourinary: Negative for dysuria and urgency.  Musculoskeletal: Negative for back pain.  Skin: Negative for rash.  Neurological: Negative for dizziness, speech change, focal weakness and headaches.  Endo/Heme/Allergies: Does not bruise/bleed easily.  Psychiatric/Behavioral: Negative for depression.   DRUG ALLERGIES:   Allergies  Allergen Reactions  . Dust Mite Extract Other (See Comments)    sneezing   VITALS:  Blood pressure 109/70, pulse 72, temperature 98.8 F (37.1 C), temperature source Oral, resp. rate 13, height 5\' 4"  (1.626 m), weight 44 kg (97 lb), SpO2 100 %. PHYSICAL EXAMINATION:  Physical Exam  Constitutional: She is oriented to person, place, and time and well-developed, well-nourished, and in no distress.  HENT:  Head: Normocephalic and atraumatic.  Eyes: Pupils are equal, round, and reactive to light. Conjunctivae and EOM are normal.  Neck: Normal range of motion. Neck supple. No tracheal deviation present. No thyromegaly present.  Cardiovascular: Normal rate, regular rhythm and normal heart sounds.   Pulmonary/Chest: Effort normal and breath sounds normal. No respiratory distress. She has no wheezes. She exhibits no tenderness.  Abdominal: Soft. Bowel sounds are normal. She exhibits no distension. There  is no tenderness.  Musculoskeletal: Normal range of motion.  Neurological: She is alert and oriented to person, place, and time. No cranial nerve deficit.  Skin: Skin is warm and dry. No rash noted.  Psychiatric: Mood and affect normal.  Dialysis access: Has fistula in left arm which is not functioning right now LABORATORY PANEL:  Female CBC  Recent Labs Lab 10/23/16 1115  WBC 11.5*  HGB 13.3  HCT 39.6  PLT 247   ------------------------------------------------------------------------------------------------------------------ Chemistries   Recent Labs Lab 10/23/16 1115 10/23/16 1800  K 6.9* 2.9*  CREATININE 12.41*  --    RADIOLOGY:  No results found. ASSESSMENT AND PLAN:  [name]  is a 41 y.o. female with a known history ofEnd-stage renal disease on hemodialysis, history of lupus, hypertension presented to specialists recovery for evaluation of a malfunctioning dialysis access and preop labs found potassium of 6.9.  1. Acute hyperkalemia -Present on admission, improved after urgent hemodialysis yesterday - Now she is hypokalemic and I have discussed it with Dr. Holley Raring who will manage this at next dialysis session  2. End-stage renal disease on hemodialysis -Nephrology following  3. Hypertension resume home meds  4. Malfunctioning HD access -Vascular surgery to do fistulogram today - She had a temporary dialysis catheter in the right femoral vein placed yesterday for urgent hemodialysis, but now site started bleeding so it has been removed       All the records are reviewed and case discussed with Care Management/Social Worker. Management plans discussed with the patient, family and they are in agreement.  CODE STATUS: Full Code  TOTAL TIME TAKING CARE OF THIS PATIENT: 35 minutes.   More than 50% of the time was spent in counseling/coordination  of care: YES  POSSIBLE D/C IN 1-2 DAYS, DEPENDING ON CLINICAL CONDITION.   Max Sane M.D on  10/24/2016 at 2:42 PM  Between 7am to 6pm - Pager - [phone]  After 6pm go to [email or web address] - Proofreader  [facility]  Office  [phone]  CC: Primary care physician; Sinda Du, MD  Note: This dictation was prepared with Dragon dictation along with smaller phrase technology. Any transcriptional errors that result from this process are unintentional.

## 2016-10-24 NOTE — Op Note (Signed)
OPERATIVE NOTE   PROCEDURE: 1. Contrast injection left upper arm hero  AV access 2. Percutaneous transluminal angioplasty eft arm hero graft to 8 mm in the graft proper and 5 mm in the brachial artery at the level of the anastomosis using a Lutonix drug-eluting balloon  PRE-OPERATIVE DIAGNOSIS: Complication of dialysis access                                                       End Stage Renal Disease  POST-OPERATIVE DIAGNOSIS: same as above   SURGEON: Katha Cabal, [name].  ANESTHESIA: Conscious sedation was administered under my direct supervision by the interventional radiology RN. IV Versed plus fentanyl were utilized. Continuous ECG, pulse oximetry and blood pressure was monitored throughout the entire procedure.  Conscious sedation was for a total of 56.  ESTIMATED BLOOD LOSS: minimal  FINDING(S): Stricture of the AV graft  SPECIMEN(S):  None  CONTRAST: 40 cc  FLUOROSCOPY TIME: 5.8 minutes  INDICATIONS: [name] is a 41 y.o. female who  presents with malfunctioning left arm hero AV access.  The patient is scheduled for angiography with possible intervention of the AV access.  The patient is aware the risks include but are not limited to: bleeding, infection, thrombosis of the cannulated access, and possible anaphylactic reaction to the contrast.  The patient acknowledges if the access can not be salvaged a tunneled catheter will be needed and will be placed during this procedure.  The patient is aware of the risks of the procedure and elects to proceed with the angiogram and intervention.  DESCRIPTION: After full informed written consent was obtained, the patient was brought back to the Special Procedure suite and placed supine position.  Appropriate cardiopulmonary monitors were placed.  The left arm was prepped and draped in the standard fashion.  Appropriate timeout is called. The left arm hero graft  was cannulated with a micropuncture needle in an antegrade  direction.  Cannulation was performed with ultrasound guidance. Ultrasound was placed in a sterile sleeve, the AV access was interrogated and noted to be echolucent and compressible indicating patency. Image was recorded for the permanent record. The puncture is performed under continuous ultrasound visualization.   The microwire was advanced and the needle was exchanged for  a microsheath.  The J-wire was then advanced and a 6 Fr sheath inserted.  Hand injections were completed to image the access from the arterial anastomosis through the entire access.  The central venous structures were also imaged by hand injections.  Based on the images,  3000 units of heparin was given and a wire was negotiated through the strictures within the venous portion of the graft.  An 8 x 6 Lutonix balloon was used.  Inflations were to 24-26 atm for 1 minute each multiple inflations were performed. Follow-up imaging demonstrated resolution of the intragraft strictures with less than 5% residual stenosis. Reflux of contrast demonstrated a string sign in the axillary artery at the level of the anastomosis.  The eft arm hero graft  was cannulated with a micropuncture needle in a retrograde direction.  Cannulation was performed with ultrasound guidance. Ultrasound was placed in a sterile sleeve, the AV access was interrogated and noted to be echolucent and compressible indicating patency. Image was recorded for the permanent record. The puncture is performed under continuous ultrasound visualization.  The microwire was advanced and the needle was exchanged for  a microsheath.  The J-wire was then advanced and a 6 Fr sheath inserted.  The Magic torque wire and Kumpe catheter was then advanced into the distal subclavian artery and magnified images of the axillary artery lesion were made initially a 4 x 4 Lutonix drug-eluting balloon was inflated across the lesion. Inflation was to 12 atm 2 minutes. Follow-up imaging through the Kumpe  catheter demonstrated the lesion was still undersized and therefore a 5 x 6 Lutonix balloon was inflated across the lesion to 14 atm for 2 minutes. Follow-up imaging demonstrated less than 5% residual stenosis  Follow-up imaging demonstrates near complete resolution of the stricture with rapid flow of contrast through the graft, the central venous anatomy is preserved.  A 4-0 Monocryl purse-string suture was sewn around the sheath.  The sheath was removed and light pressure was applied.  A sterile bandage was applied to the puncture site.    COMPLICATIONS: None  CONDITION: [name], [name] [facility] Office: [phone]  10/24/2016 5:23 PM

## 2016-10-24 NOTE — Progress Notes (Signed)
Patient post procedure, clinically stable, sr per monitor.denies complaints,taking po's without difficulty.report called to Ed Blalock on telemetry with plan reviewed.

## 2016-10-24 NOTE — Consult Note (Signed)
[facility] CONSULT NOTE    Date: 10/24/2016                  Patient Name:  [name]  MRN: [id number]  DOB: Jun 05, 1975  Age / Sex: 41 y.o., female         PCP: Sinda Du, MD                 Service Requesting Consult: Vascular Surgery                 Reason for Consult: Management of ESRD            History of Present Illness: Patient is a 41 y.o. female with a PMHx of ESRD on HD TTS, depression, DVT, hypertension, history of lupus, who was admitted to [facility] on 10/23/2016 for evaluation of dysfunctional HERO graft.  Preoperative labs showed a serum potassium of 6.9.  Therefore we were called to perform dialysis urgently which we did.  Patient tolerated the dialysis treatment quite well yesterday.  However this a.m. The patient was having some oozing from her right groin and catheter. After discussion with vascular surgery we decided to remove this catheter.  Patient to have additional vascular evaluation today of her dysfunctional graft.   Medications: Outpatient medications: Prescriptions Prior to Admission  Medication Sig Dispense Refill Last Dose  . albuterol (PROVENTIL) (2.5 MG/3ML) 0.083% nebulizer solution Take 2.5 mg by nebulization 4 (four) times daily as needed (breathing).   Not Taking at Unknown time  . ALPRAZolam (XANAX) 0.25 MG tablet Take 0.25 mg by mouth 2 (two) times daily as needed for anxiety.    10/23/2016 at Unknown time  . aspirin EC 81 MG tablet Take 81 mg by mouth daily.   10/23/2016 at Unknown time  . chlorhexidine (PERIDEX) 0.12 % solution Use as directed 10 mLs in the mouth or throat 2 (two) times daily.   10/23/2016 at Unknown time  . clopidogrel (PLAVIX) 75 MG tablet Take 75 mg by mouth daily.   10/23/2016 at Unknown time  . docusate sodium (COLACE) 100 MG capsule Take 100 mg by mouth daily.   10/22/2016 at Unknown time  . gabapentin (NEURONTIN) 100 MG capsule Take 100 mg by mouth.    10/22/2016 at Unknown time  . guaiFENesin  (ROBITUSSIN) 100 MG/5ML liquid Take 200 mg by mouth 3 (three) times daily as needed for cough.   Past Week at Unknown time  . HYDROcodone-acetaminophen (NORCO/VICODIN) 5-325 MG per tablet Take 2 tablets by mouth every 4 (four) hours as needed. 10 tablet 0 Past Week at Unknown time  . hydroxychloroquine (PLAQUENIL) 200 MG tablet Take 200 mg by mouth daily.   10/23/2016 at Unknown time  . ipratropium (ATROVENT) 0.02 % nebulizer solution Take 250 mcg by nebulization 4 (four) times daily as needed (breathing).   10/22/2016 at Unknown time  . ipratropium-albuterol (DUONEB) 0.5-2.5 (3) MG/3ML SOLN 3 mLs as needed.    Past Week at Unknown time  . lidocaine-prilocaine (EMLA) cream Apply 1 application topically as needed (for port access).    10/22/2016 at Unknown time  . loratadine (CLARITIN) 10 MG tablet Take 10 mg by mouth daily as needed for allergies.   10/22/2016 at Unknown time  . midodrine (PROAMATINE) 10 MG tablet Take 10 mg by mouth 2 (two) times daily. Monday, Wednesday, and Friday before dialysis   10/22/2016 at Unknown time  . mirtazapine (REMERON) 15 MG tablet 15 mg at bedtime.    10/22/2016  at Unknown time  . multivitamin (RENA-VIT) TABS tablet Take 1 tablet by mouth daily.   10/23/2016 at Unknown time  . ondansetron (ZOFRAN) 8 MG tablet Take by mouth as needed for nausea or vomiting.   Past Week at Unknown time  . pantoprazole (PROTONIX) 40 MG tablet Take 40 mg by mouth daily.   10/22/2016 at Unknown time  . phenytoin (DILANTIN) 100 MG ER capsule Take 200 mg by mouth 2 (two) times daily.    10/23/2016 at Unknown time  . polyethylene glycol (MIRALAX / GLYCOLAX) packet Take 17 g by mouth daily as needed for mild constipation.   Past Week at Unknown time  . sertraline (ZOLOFT) 100 MG tablet Take 200 mg by mouth daily.    10/23/2016 at Unknown time  . sevelamer carbonate (RENVELA) 800 MG tablet Take 800-1,600 mg by mouth 4 (four) times daily. Takes 1600 mg with each meal and 800 mg with snack   10/23/2016 at Unknown  time  . simvastatin (ZOCOR) 40 MG tablet Take 40 mg by mouth daily.   10/23/2016 at Unknown time  . doxercalciferol (HECTOROL) 4 MCG/2ML injection Inject into the vein.   Taking    Current medications: Current Facility-Administered Medications  Medication Dose Route Frequency Provider Last Rate Last Dose  . [MAR Hold] 0.9 %  sodium chloride infusion  100 mL Intravenous PRN Adelayde Minney, MD      . Doug Sou Hold] 0.9 %  sodium chloride infusion  100 mL Intravenous PRN Holley Raring, Ousmane Seeman, MD      . Doug Sou Hold] acetaminophen (TYLENOL) tablet 650 mg  650 mg Oral Q6H PRN Fritzi Mandes, MD       Or  . Doug Sou Hold] acetaminophen (TYLENOL) suppository 650 mg  650 mg Rectal Q6H PRN Fritzi Mandes, MD      . Doug Sou Hold] albuterol (PROVENTIL) (2.5 MG/3ML) 0.083% nebulizer solution 2.5 mg  2.5 mg Nebulization QID PRN Fritzi Mandes, MD      . Doug Sou Hold] ALPRAZolam Duanne Moron) tablet 0.25 mg  0.25 mg Oral BID PRN Fritzi Mandes, MD      . Doug Sou Hold] alteplase (CATHFLO ACTIVASE) injection 2 mg  2 mg Intracatheter Once PRN Anthonette Legato, MD      . Doug Sou Hold] docusate sodium (COLACE) capsule 100 mg  100 mg Oral Daily Fritzi Mandes, MD   100 mg at 10/24/16 0909  . [MAR Hold] gabapentin (NEURONTIN) capsule 100 mg  100 mg Oral Daily Fritzi Mandes, MD   100 mg at 10/24/16 0909  . [MAR Hold] guaiFENesin (ROBITUSSIN) 100 MG/5ML solution 200 mg  200 mg Oral TID PRN Fritzi Mandes, MD      . Doug Sou Hold] heparin injection 1,000 Units  1,000 Units Dialysis PRN Anthonette Legato, MD      . Doug Sou Hold] heparin injection 5,000 Units  5,000 Units Subcutaneous Q8H Fritzi Mandes, MD   5,000 Units at 10/23/16 2137  . [MAR Hold] HYDROcodone-acetaminophen (NORCO/VICODIN) 5-325 MG per tablet 2 tablet  2 tablet Oral Q4H PRN Fritzi Mandes, MD   2 tablet at 10/23/16 2232  . [MAR Hold] hydroxychloroquine (PLAQUENIL) tablet 200 mg  200 mg Oral Daily Fritzi Mandes, MD   200 mg at 10/24/16 1000  . [MAR Hold] ipratropium-albuterol (DUONEB) 0.5-2.5 (3) MG/3ML nebulizer solution  3 mL  3 mL Nebulization Q4H PRN Fritzi Mandes, MD      . Doug Sou Hold] lidocaine (PF) (XYLOCAINE) 1 % injection 5 mL  5 mL Intradermal PRN Anthonette Legato, MD      . [  MAR Hold] lidocaine-prilocaine (EMLA) cream 1 application  1 application Topical PRN Kalen Ratajczak, MD      . Doug Sou Hold] midodrine (PROAMATINE) tablet 10 mg  10 mg Oral Q M,W,F Fritzi Mandes, MD   10 mg at 10/24/16 0909  . [MAR Hold] mirtazapine (REMERON) tablet 15 mg  15 mg Oral QHS Fritzi Mandes, MD   15 mg at 10/23/16 2137  . [MAR Hold] multivitamin (RENA-VIT) tablet 1 tablet  1 tablet Oral Daily Fritzi Mandes, MD   1 tablet at 10/24/16 0955  . [MAR Hold] ondansetron (ZOFRAN) tablet 4 mg  4 mg Oral Q6H PRN Fritzi Mandes, MD       Or  . Doug Sou Hold] ondansetron [facility]) injection 4 mg  4 mg Intravenous Q6H PRN Fritzi Mandes, MD      . Doug Sou Hold] pantoprazole (PROTONIX) EC tablet 40 mg  40 mg Oral Daily Fritzi Mandes, MD   40 mg at 10/24/16 0909  . [MAR Hold] pentafluoroprop-tetrafluoroeth (GEBAUERS) aerosol 1 application  1 application Topical PRN Vaniya Augspurger, MD      . Doug Sou Hold] phenytoin (DILANTIN) ER capsule 200 mg  200 mg Oral BID Fritzi Mandes, MD   200 mg at 10/24/16 0955  . [MAR Hold] polyethylene glycol (MIRALAX / GLYCOLAX) packet 17 g  17 g Oral Daily PRN Fritzi Mandes, MD      . Doug Sou Hold] sertraline (ZOLOFT) tablet 200 mg  200 mg Oral Daily Fritzi Mandes, MD   200 mg at 10/24/16 0909  . [MAR Hold] sevelamer carbonate (RENVELA) tablet 800-1,600 mg  800-1,600 mg Oral QID Fritzi Mandes, MD   800 mg at 10/23/16 2137  . [MAR Hold] simvastatin (ZOCOR) tablet 40 mg  40 mg Oral Daily Fritzi Mandes, MD   40 mg at 10/24/16 0909   Facility-Administered Medications Ordered in Other Encounters  Medication Dose Route Frequency Provider Last Rate Last Dose  . 0.9 %  sodium chloride infusion   Intravenous Once Monia Sabal, PA-C      . ceFAZolin (ANCEF) IVPB 2 g/50 mL premix  2 g Intravenous Once Monia Sabal, PA-C          Allergies: Allergies   Allergen Reactions  . Dust Mite Extract Other (See Comments)    sneezing      Past Medical History: Past Medical History:  Diagnosis Date  . Anemia   . BV (bacterial vaginosis) 11/08/2013  . Depression   . Dialysis patient ([place])   . DVT (deep venous thrombosis) ([place])   . Hypertension   . Lupus   . Renal disorder   . Vaginal odor 11/08/2013     Past Surgical History: Past Surgical History:  Procedure Laterality Date  . AV FISTULA PLACEMENT    . DIALYSIS/PERMA CATHETER INSERTION  10/23/2016   Procedure: DIALYSIS/PERMA CATHETER INSERTION;  Surgeon: Algernon Huxley, MD;  Location: [place] [facility];  Service: Cardiovascular;;  . PERIPHERAL VASCULAR CATHETERIZATION Left 01/04/2015   Procedure: A/V Shuntogram/Fistulagram;  Surgeon: Algernon Huxley, MD;  Location: [place] [facility];  Service: Cardiovascular;  Laterality: Left;  . PERIPHERAL VASCULAR CATHETERIZATION N/A 01/04/2015   Procedure: A/V Shunt Intervention;  Surgeon: Algernon Huxley, MD;  Location: [place] [facility];  Service: Cardiovascular;  Laterality: N/A;  . PERIPHERAL VASCULAR CATHETERIZATION Left 04/13/2015   Procedure: A/V Shuntogram/Fistulagram;  Surgeon: Katha Cabal, MD;  Location: [place] [facility];  Service: Cardiovascular;  Laterality: Left;  . PERIPHERAL VASCULAR CATHETERIZATION Left 04/13/2015   Procedure: A/V  Shunt Intervention;  Surgeon: Katha Cabal, MD;  Location: [place] [facility];  Service: Cardiovascular;  Laterality: Left;     Family History: Family History  Problem Relation Age of Onset  . Seizures Son   . Kidney disease Mother        on dialysis  . Lupus Mother      Social History: Social History   Social History  . Marital status: Single    Spouse name: N/A  . Number of children: N/A  . Years of education: N/A   Occupational History  . Not on file.   Social History Main Topics  . Smoking status: Current Every Day Smoker    Packs/day: 0.00    Years: 15.00    Types:  Cigarettes  . Smokeless tobacco: Never Used     Comment: smokes 2 cig daily  . Alcohol use No  . Drug use: No  . Sexual activity: No   Other Topics Concern  . Not on file   Social History Narrative  . No narrative on file     Review of Systems: Review of Systems  Constitutional: Negative for chills, fever, malaise/fatigue and weight loss.  HENT: Negative for hearing loss, nosebleeds and tinnitus.   Eyes: Negative for blurred vision and double vision.  Respiratory: Negative for cough, hemoptysis and sputum production.   Cardiovascular: Negative for chest pain, palpitations and orthopnea.  Gastrointestinal: Negative for heartburn, nausea and vomiting.  Genitourinary: Negative for dysuria, frequency and urgency.  Musculoskeletal: Negative for joint pain and myalgias.  Skin: Negative for itching and rash.  Neurological: Negative for dizziness and focal weakness.  Endo/Heme/Allergies: Negative for polydipsia. Bruises/bleeds easily.  Psychiatric/Behavioral: Positive for depression. Negative for memory loss.     Vital Signs: Blood pressure 109/70, pulse 72, temperature 98.8 F (37.1 C), temperature source Oral, resp. rate 13, height 5\' 4"  (1.626 m), weight 44 kg (97 lb), SpO2 100 %.  Weight trends: Filed Weights   10/23/16 1025 10/23/16 1510 10/24/16 1342  Weight: 44 kg (97 lb) 44 kg (97 lb) 44 kg (97 lb)    Physical Exam: General: NAD, resting in bed  Head: Normocephalic, atraumatic.  Eyes: Anicteric, EOMI  Nose: Mucous membranes moist, not inflammed, nonerythematous.  Throat: Oropharynx nonerythematous, no exudate appreciated.   Neck: Supple, trachea midline.  Lungs:  Normal respiratory effort. Clear to auscultation BL without crackles or wheezes.  Heart: RRR. S1 and S2 normal without gallop, murmur, or rubs.  Abdomen:  BS normoactive. Soft, Nondistended, non-tender.  No masses or organomegaly.  Extremities: No pretibial edema, left femoral dialysis catheter removed   Neurologic: A&O X3, Motor strength is 5/5 in the all 4 extremities  Skin: No visible rashes, scars.    Lab results: Basic Metabolic Panel:  Recent Labs Lab 10/23/16 1115 10/23/16 1800  K 6.9* 2.9*  CREATININE 12.41*  --   PHOS 4.3  --     Liver Function Tests: No results for input(s): AST, ALT, ALKPHOS, BILITOT, PROT, ALBUMIN in the last 168 hours. No results for input(s): LIPASE, AMYLASE in the last 168 hours. No results for input(s): AMMONIA in the last 168 hours.  CBC:  Recent Labs Lab 10/23/16 1115  WBC 11.5*  HGB 13.3  HCT 39.6  MCV 90.7  PLT 247    Cardiac Enzymes: No results for input(s): CKTOTAL, CKMB, CKMBINDEX, TROPONINI in the last 168 hours.  BNP: Invalid input(s): POCBNP  CBG: No results for input(s): GLUCAP in the last 168 hours.  Microbiology:  Results for orders placed or performed during the hospital encounter of 10/23/16  MRSA PCR Screening     Status: None   Collection Time: 10/23/16  7:46 PM  Result Value Ref Range Status   MRSA by PCR NEGATIVE NEGATIVE Final    Comment:        The GeneXpert MRSA Assay (FDA approved for NASAL specimens only), is one component of a comprehensive MRSA colonization surveillance program. It is not intended to diagnose MRSA infection nor to guide or monitor treatment for MRSA infections.     Coagulation Studies: No results for input(s): LABPROT, INR in the last 72 hours.  Urinalysis: No results for input(s): COLORURINE, LABSPEC, PHURINE, GLUCOSEU, HGBUR, BILIRUBINUR, KETONESUR, PROTEINUR, UROBILINOGEN, NITRITE, LEUKOCYTESUR in the last 72 hours.  Invalid input(s): APPERANCEUR    Imaging:  No results found.   Assessment & Plan: Pt is a 41 y.o. female with a PMHx of ESRD on HD TTS, depression, DVT, hypertension, history of lupus, who was admitted to [facility] on 10/23/2016 for evaluation of dysfunctional HERO graft.   1.  ESRD on HD TTS.  Patient did undergo dialysis yesterday.  She tolerated this  well.  Thereafter she began having bleeding from her right temporary femoral dialysis catheter.  It has been subsequently removed.  Next also treatment to be scheduled for tomorrow if the patient is still here.  2.  Hyperkalemia.  Serum potassium was 6.9.  She was dialyzed against a 1K bath yesterday and potassium did come down.  Potassium was 2.9 shortly after dialysis however through requilibration, it will come up.   3.  Secondary hyperparathyroidism.  PTH 193 and phosphorus 4.3 in both acceptable.  Continue to monitor.  4.  Complication dialysis device.  Patient for fistulogram later today to address her graft.

## 2016-10-24 NOTE — Care Management Obs Status (Signed)
[place] [facility] NOTIFICATION   Patient Details  Name: [name] MRN: [id number] Date of Birth: [birth year]   Medicare Observation Status Notification Given:  Yes patient has legal guardian [place] [employer].  Explained and faxed Obs/Code 44 notice to agency.  Katrina Stack, RN 10/24/2016, 3:06 PM

## 2016-10-24 NOTE — Progress Notes (Signed)
Patient HD cath removed intact as order , pressure dressing apply, will continue to monitor site

## 2016-10-25 DIAGNOSIS — N186 End stage renal disease: Secondary | ICD-10-CM | POA: Diagnosis not present

## 2016-10-25 DIAGNOSIS — I120 Hypertensive chronic kidney disease with stage 5 chronic kidney disease or end stage renal disease: Secondary | ICD-10-CM | POA: Diagnosis not present

## 2016-10-25 DIAGNOSIS — N2581 Secondary hyperparathyroidism of renal origin: Secondary | ICD-10-CM | POA: Diagnosis not present

## 2016-10-25 DIAGNOSIS — T82858A Stenosis of vascular prosthetic devices, implants and grafts, initial encounter: Secondary | ICD-10-CM | POA: Diagnosis not present

## 2016-10-25 DIAGNOSIS — E1122 Type 2 diabetes mellitus with diabetic chronic kidney disease: Secondary | ICD-10-CM | POA: Diagnosis not present

## 2016-10-25 DIAGNOSIS — T82590A Other mechanical complication of surgically created arteriovenous fistula, initial encounter: Secondary | ICD-10-CM | POA: Diagnosis not present

## 2016-10-25 DIAGNOSIS — E875 Hyperkalemia: Secondary | ICD-10-CM | POA: Diagnosis not present

## 2016-10-25 DIAGNOSIS — I10 Essential (primary) hypertension: Secondary | ICD-10-CM | POA: Diagnosis not present

## 2016-10-25 DIAGNOSIS — Z992 Dependence on renal dialysis: Secondary | ICD-10-CM | POA: Diagnosis not present

## 2016-10-25 LAB — BASIC METABOLIC PANEL
ANION GAP: 14 (ref 5–15)
BUN: 54 mg/dL — AB (ref 6–20)
CALCIUM: 9.2 mg/dL (ref 8.9–10.3)
CO2: 26 mmol/L (ref 22–32)
Chloride: 94 mmol/L — ABNORMAL LOW (ref 101–111)
Creatinine, Ser: 10.15 mg/dL — ABNORMAL HIGH (ref 0.44–1.00)
GFR calc Af Amer: 5 mL/min — ABNORMAL LOW (ref >60)
GFR, EST NON AFRICAN AMERICAN: 4 mL/min — AB (ref >60)
GLUCOSE: 76 mg/dL (ref 65–99)
Potassium: 6.7 mmol/L (ref 3.5–5.1)
Sodium: 134 mmol/L — ABNORMAL LOW (ref 135–145)

## 2016-10-25 LAB — CBC
HCT: 39.9 % (ref 35.0–47.0)
Hemoglobin: 13.2 g/dL (ref 12.0–16.0)
MCH: 29.5 pg (ref 26.0–34.0)
MCHC: 33.1 g/dL (ref 32.0–36.0)
MCV: 89.1 fL (ref 80.0–100.0)
PLATELETS: 205 10*3/uL (ref 150–440)
RBC: 4.47 MIL/uL (ref 3.80–5.20)
RDW: 13.8 % (ref 11.5–14.5)
WBC: 9.5 10*3/uL (ref 3.6–11.0)

## 2016-10-25 LAB — PHOSPHORUS: Phosphorus: 7 mg/dL — ABNORMAL HIGH (ref 2.5–4.6)

## 2016-10-25 MED ORDER — SODIUM POLYSTYRENE SULFONATE 15 GM/60ML PO SUSP
30.0000 g | Freq: Once | ORAL | Status: AC
  Administered 2016-10-25: 30 g via ORAL
  Filled 2016-10-25: qty 120

## 2016-10-25 MED ORDER — INSULIN REGULAR HUMAN 100 UNIT/ML IJ SOLN
10.0000 [IU] | Freq: Once | INTRAMUSCULAR | Status: AC
  Administered 2016-10-25: 10 [IU] via INTRAVENOUS
  Filled 2016-10-25: qty 0.1

## 2016-10-25 MED ORDER — DEXTROSE 50 % IV SOLN
25.0000 g | Freq: Once | INTRAVENOUS | Status: AC
  Administered 2016-10-25: 25 g via INTRAVENOUS
  Filled 2016-10-25: qty 50

## 2016-10-25 NOTE — Progress Notes (Signed)
[place] [facility] [place] NAME: [name]    MR#:  [id number]  DATE OF BIRTH:  Nov 14, 1975  SUBJECTIVE:  CHIEF COMPLAINT:  No chief complaint on file. No complaints, laying in the bed REVIEW OF SYSTEMS:  Review of Systems  Constitutional: Negative for chills, fever and weight loss.  HENT: Negative for nosebleeds and sore throat.   Eyes: Negative for blurred vision.  Respiratory: Negative for cough, shortness of breath and wheezing.   Cardiovascular: Negative for chest pain, orthopnea, leg swelling and PND.  Gastrointestinal: Negative for abdominal pain, constipation, diarrhea, heartburn, nausea and vomiting.  Genitourinary: Negative for dysuria and urgency.  Musculoskeletal: Negative for back pain.  Skin: Negative for rash.  Neurological: Negative for dizziness, speech change, focal weakness and headaches.  Endo/Heme/Allergies: Does not bruise/bleed easily.  Psychiatric/Behavioral: Negative for depression.   DRUG ALLERGIES:   Allergies  Allergen Reactions  . Dust Mite Extract Other (See Comments)    sneezing   VITALS:  Blood pressure 99/72, pulse (!) 109, temperature 99.1 F (37.3 C), temperature source Oral, resp. rate 13, height 5\' 4"  (1.626 m), weight 100 lb 8.5 oz (45.6 kg), SpO2 98 %. PHYSICAL EXAMINATION:  Physical Exam  Constitutional: She is oriented to person, place, and time and well-developed, well-nourished, and in no distress.  HENT:  Head: Normocephalic and atraumatic.  Eyes: Pupils are equal, round, and reactive to light. Conjunctivae and EOM are normal.  Neck: Normal range of motion. Neck supple. No tracheal deviation present. No thyromegaly present.  Cardiovascular: Normal rate, regular rhythm and normal heart sounds.   Pulmonary/Chest: Effort normal and breath sounds normal. No respiratory distress. She has no wheezes. She exhibits no tenderness.  Abdominal: Soft. Bowel sounds are normal. She exhibits no  distension. There is no tenderness.  Musculoskeletal: Normal range of motion.  Neurological: She is alert and oriented to person, place, and time. No cranial nerve deficit.  Skin: Skin is warm and dry. No rash noted.  Psychiatric: Mood and affect normal.  Dialysis access: Has fistula in left arm which is not functioning right now LABORATORY PANEL:  Female CBC  Recent Labs Lab 10/25/16 0427  WBC 9.5  HGB 13.2  HCT 39.9  PLT 205   ------------------------------------------------------------------------------------------------------------------ Chemistries   Recent Labs Lab 10/25/16 0427  NA 134*  K 6.7*  CL 94*  CO2 26  GLUCOSE 76  BUN 54*  CREATININE 10.15*  CALCIUM 9.2   RADIOLOGY:  No results found. ASSESSMENT AND PLAN:  [name]  is a 41 y.o. female with a known history ofEnd-stage renal disease on hemodialysis, history of lupus, hypertension presented to specialists recovery for evaluation of a malfunctioning dialysis access and preop labs found potassium of 6.9.  1.severehyperkalemia -Present on admission, - still very low patient will get hemodialysis today  2. End-stage renal disease on hemodialysis -Nephrology following  3. Hypertension resume home meds  4. Malfunctioning HD access -Vascular surgery date of balloon angioplasty   Disposition discharge once okay per nephrology      All the records are reviewed and case discussed with Care Management/Social Worker. Management plans discussed with the patient, family and they are in agreement.  CODE STATUS: Full Code  TOTAL TIME TAKING CARE OF THIS PATIENT: 32 minutes.   More than 50% of the time was spent in counseling/coordination of care: YES  POSSIBLE D/C IN 1-2 DAYS, DEPENDING ON CLINICAL CONDITION.   Dustin Flock M.D on 10/25/2016 at 1:29 PM  Between 7am to 6pm - Pager - [phone]  After 6pm go to [email or web address] - Proofreader  [facility]  Office  [phone]  CC: Primary care physician; Sinda Du, MD  Note: This dictation was prepared with Dragon dictation along with smaller phrase technology. Any transcriptional errors that result from this process are unintentional.

## 2016-10-25 NOTE — Progress Notes (Signed)
HD STARTED  

## 2016-10-25 NOTE — Progress Notes (Signed)
Hd completed 

## 2016-10-25 NOTE — Progress Notes (Signed)
[facility]  ROUNDING NOTE   Subjective:  Patient seen and evaluated during dialysis.  She appears to be tolerating dialysis well. Potassium noted to be high again at 6.7.  Objective:  Vital signs in last 24 hours:  Temp:  [98.2 F (36.8 C)-99.1 F (37.3 C)] 99.1 F (37.3 C) (08/11 1020) Pulse Rate:  [64-114] 66 (08/11 1200) Resp:  [10-21] 12 (08/11 1200) BP: (87-112)/(55-96) 97/69 (08/11 1200) SpO2:  [96 %-100 %] 98 % (08/11 0948) Weight:  [44 kg (97 lb)-45.6 kg (100 lb 8.5 oz)] 45.6 kg (100 lb 8.5 oz) (08/11 1020)  Weight change: 0 kg (0 lb) Filed Weights   10/23/16 1510 10/24/16 1342 10/25/16 1020  Weight: 44 kg (97 lb) 44 kg (97 lb) 45.6 kg (100 lb 8.5 oz)    Intake/Output: I/O last 3 completed shifts: In: 240 [P.O.:240] Out: 23 [Urine:50]   Intake/Output this shift:  Total I/O In: 240 [P.O.:240] Out: -   Physical Exam: General: No acute distress  Head: Normocephalic, atraumatic. Moist oral mucosal membranes  Eyes: Anicteric  Neck: Supple, trachea midline  Lungs:  Clear to auscultation, normal effort  Heart: S1S2 no rubs  Abdomen:  Soft, nontender, bowel sounds present  Extremities: Trace peripheral edema.  Neurologic: Awake, alert, following commands  Skin: No lesions  Access: LUE HERO    Basic Metabolic Panel:  Recent Labs Lab 10/23/16 1115 10/23/16 1800 10/24/16 1341 10/25/16 0427  NA  --   --   --  134*  K 6.9* 2.9* 5.4* 6.7*  CL  --   --   --  94*  CO2  --   --   --  26  GLUCOSE  --   --   --  76  BUN  --   --   --  54*  CREATININE 12.41*  --   --  10.15*  CALCIUM  --   --   --  9.2  PHOS 4.3  --   --  7.0*    Liver Function Tests: No results for input(s): AST, ALT, ALKPHOS, BILITOT, PROT, ALBUMIN in the last 168 hours. No results for input(s): LIPASE, AMYLASE in the last 168 hours. No results for input(s): AMMONIA in the last 168 hours.  CBC:  Recent Labs Lab 10/23/16 1115 10/25/16 0427  WBC 11.5* 9.5  HGB 13.3  13.2  HCT 39.6 39.9  MCV 90.7 89.1  PLT 247 205    Cardiac Enzymes: No results for input(s): CKTOTAL, CKMB, CKMBINDEX, TROPONINI in the last 168 hours.  BNP: Invalid input(s): POCBNP  CBG: No results for input(s): GLUCAP in the last 168 hours.  Microbiology: Results for orders placed or performed during the hospital encounter of 10/23/16  MRSA PCR Screening     Status: None   Collection Time: 10/23/16  7:46 PM  Result Value Ref Range Status   MRSA by PCR NEGATIVE NEGATIVE Final    Comment:        The GeneXpert MRSA Assay (FDA approved for NASAL specimens only), is one component of a comprehensive MRSA colonization surveillance program. It is not intended to diagnose MRSA infection nor to guide or monitor treatment for MRSA infections.     Coagulation Studies: No results for input(s): LABPROT, INR in the last 72 hours.  Urinalysis: No results for input(s): COLORURINE, LABSPEC, PHURINE, GLUCOSEU, HGBUR, BILIRUBINUR, KETONESUR, PROTEINUR, UROBILINOGEN, NITRITE, LEUKOCYTESUR in the last 72 hours.  Invalid input(s): APPERANCEUR    Imaging: No results found.   Medications:   .  sodium chloride    . sodium chloride     . docusate sodium  100 mg Oral Daily  . gabapentin  100 mg Oral Daily  . heparin  5,000 Units Subcutaneous Q8H  . hydroxychloroquine  200 mg Oral Daily  . midodrine  10 mg Oral Q M,W,F  . mirtazapine  15 mg Oral QHS  . multivitamin  1 tablet Oral Daily  . pantoprazole  40 mg Oral Daily  . phenytoin  200 mg Oral BID  . sertraline  200 mg Oral Daily  . sevelamer carbonate  800-1,600 mg Oral QID  . simvastatin  40 mg Oral Daily   sodium chloride, sodium chloride, acetaminophen **OR** acetaminophen, albuterol, ALPRAZolam, alteplase, guaiFENesin, heparin, HYDROcodone-acetaminophen, ipratropium-albuterol, lidocaine (PF), lidocaine-prilocaine, ondansetron **OR** ondansetron (ZOFRAN) IV, pentafluoroprop-tetrafluoroeth, polyethylene glycol  Assessment/  Plan:  41 y.o. female  with a PMHx of ESRD on HD TTS, depression, DVT, hypertension, history of lupus, who was admitted to [facility] on 10/23/2016 for evaluation of dysfunctional HERO graft.   1.  ESRD on HD TTS.  patient seen and evaluated during hemodialysis.  Appears to be tolerating well. Declotted access is working well at the moment.  2.  Hyperkalemia.  potassium back up to 6.7.  Patient currently undergoing dialysis which should help to treat the hyperkalemia.   3.  Secondary hyperparathyroidism.  PTH and phosphorus at target.  Continue Renvela.  4.  Complication dialysis device. Left upper extremity AV graft has been declotted and appears to be functioning well today.   LOS: 1 Telesha Deguzman 8/11/201812:25 PM

## 2016-10-25 NOTE — Progress Notes (Signed)
POST DIALYSIS ASSESSMENT 

## 2016-10-25 NOTE — Progress Notes (Signed)
CRITICAL VALUE ALERT  Critical Value:  Potassium 6.7  Date & Time Notied:  10/25/2016 0554  Provider Notified: Lance Coon   Orders Received/Actions taken: MD to place orders for hyperkalemia protocol.  Earleen Reaper, RN

## 2016-10-25 NOTE — Progress Notes (Signed)
PRE DIALYSIS ASSESSMENT 

## 2016-10-26 DIAGNOSIS — E1122 Type 2 diabetes mellitus with diabetic chronic kidney disease: Secondary | ICD-10-CM | POA: Diagnosis not present

## 2016-10-26 DIAGNOSIS — T82590A Other mechanical complication of surgically created arteriovenous fistula, initial encounter: Secondary | ICD-10-CM | POA: Diagnosis not present

## 2016-10-26 DIAGNOSIS — I10 Essential (primary) hypertension: Secondary | ICD-10-CM | POA: Diagnosis not present

## 2016-10-26 DIAGNOSIS — I120 Hypertensive chronic kidney disease with stage 5 chronic kidney disease or end stage renal disease: Secondary | ICD-10-CM | POA: Diagnosis not present

## 2016-10-26 DIAGNOSIS — N186 End stage renal disease: Secondary | ICD-10-CM | POA: Diagnosis not present

## 2016-10-26 DIAGNOSIS — D631 Anemia in chronic kidney disease: Secondary | ICD-10-CM | POA: Diagnosis not present

## 2016-10-26 DIAGNOSIS — N2581 Secondary hyperparathyroidism of renal origin: Secondary | ICD-10-CM | POA: Diagnosis not present

## 2016-10-26 DIAGNOSIS — E875 Hyperkalemia: Secondary | ICD-10-CM | POA: Diagnosis not present

## 2016-10-26 DIAGNOSIS — Z992 Dependence on renal dialysis: Secondary | ICD-10-CM | POA: Diagnosis not present

## 2016-10-26 DIAGNOSIS — T82858A Stenosis of vascular prosthetic devices, implants and grafts, initial encounter: Secondary | ICD-10-CM | POA: Diagnosis not present

## 2016-10-26 LAB — BASIC METABOLIC PANEL
ANION GAP: 15 (ref 5–15)
BUN: 32 mg/dL — ABNORMAL HIGH (ref 6–20)
CALCIUM: 8.8 mg/dL — AB (ref 8.9–10.3)
CO2: 31 mmol/L (ref 22–32)
CREATININE: 7.28 mg/dL — AB (ref 0.44–1.00)
Chloride: 90 mmol/L — ABNORMAL LOW (ref 101–111)
GFR, EST AFRICAN AMERICAN: 7 mL/min — AB (ref >60)
GFR, EST NON AFRICAN AMERICAN: 6 mL/min — AB (ref >60)
Glucose, Bld: 86 mg/dL (ref 65–99)
Potassium: 3.8 mmol/L (ref 3.5–5.1)
SODIUM: 136 mmol/L (ref 135–145)

## 2016-10-26 MED ORDER — MIDODRINE HCL 5 MG PO TABS
20.0000 mg | ORAL_TABLET | Freq: Once | ORAL | Status: AC
  Administered 2016-10-26: 20 mg via ORAL
  Filled 2016-10-26: qty 4

## 2016-10-26 NOTE — Progress Notes (Signed)
[facility]  ROUNDING NOTE   Subjective:  Patient underwent hemodialysis yesterday. Her graft functioned well. Potassium down to 3.8 today.  Objective:  Vital signs in last 24 hours:  Temp:  [98 F (36.7 C)-98.9 F (37.2 C)] 98 F (36.7 C) (08/12 0831) Pulse Rate:  [66-117] 79 (08/12 0831) Resp:  [12-21] 18 (08/12 0831) BP: (83-103)/(39-81) 83/39 (08/12 0831) SpO2:  [95 %-100 %] 100 % (08/12 0831)  Weight change: 1.601 kg (3 lb 8.5 oz) Filed Weights   10/23/16 1510 10/24/16 1342 10/25/16 1020  Weight: 44 kg (97 lb) 44 kg (97 lb) 45.6 kg (100 lb 8.5 oz)    Intake/Output: I/O last 3 completed shifts: In: 360 [P.O.:360] Out: 1050 [Urine:50; Other:1000]   Intake/Output this shift:  No intake/output data recorded.  Physical Exam: General: No acute distress  Head: Normocephalic, atraumatic. Moist oral mucosal membranes  Eyes: Anicteric  Neck: Supple, trachea midline  Lungs:  Clear to auscultation, normal effort  Heart: S1S2 no rubs  Abdomen:  Soft, nontender, bowel sounds present  Extremities: Trace peripheral edema.  Neurologic: Awake, alert, following commands  Skin: No lesions  Access: LUE HERO    Basic Metabolic Panel:  Recent Labs Lab 10/23/16 1115 10/23/16 1800 10/24/16 1341 10/25/16 0427 10/26/16 0536  NA  --   --   --  134* 136  K 6.9* 2.9* 5.4* 6.7* 3.8  CL  --   --   --  94* 90*  CO2  --   --   --  26 31  GLUCOSE  --   --   --  76 86  BUN  --   --   --  54* 32*  CREATININE 12.41*  --   --  10.15* 7.28*  CALCIUM  --   --   --  9.2 8.8*  PHOS 4.3  --   --  7.0*  --     Liver Function Tests: No results for input(s): AST, ALT, ALKPHOS, BILITOT, PROT, ALBUMIN in the last 168 hours. No results for input(s): LIPASE, AMYLASE in the last 168 hours. No results for input(s): AMMONIA in the last 168 hours.  CBC:  Recent Labs Lab 10/23/16 1115 10/25/16 0427  WBC 11.5* 9.5  HGB 13.3 13.2  HCT 39.6 39.9  MCV 90.7 89.1  PLT 247 205     Cardiac Enzymes: No results for input(s): CKTOTAL, CKMB, CKMBINDEX, TROPONINI in the last 168 hours.  BNP: Invalid input(s): POCBNP  CBG: No results for input(s): GLUCAP in the last 168 hours.  Microbiology: Results for orders placed or performed during the hospital encounter of 10/23/16  MRSA PCR Screening     Status: None   Collection Time: 10/23/16  7:46 PM  Result Value Ref Range Status   MRSA by PCR NEGATIVE NEGATIVE Final    Comment:        The GeneXpert MRSA Assay (FDA approved for NASAL specimens only), is one component of a comprehensive MRSA colonization surveillance program. It is not intended to diagnose MRSA infection nor to guide or monitor treatment for MRSA infections.     Coagulation Studies: No results for input(s): LABPROT, INR in the last 72 hours.  Urinalysis: No results for input(s): COLORURINE, LABSPEC, PHURINE, GLUCOSEU, HGBUR, BILIRUBINUR, KETONESUR, PROTEINUR, UROBILINOGEN, NITRITE, LEUKOCYTESUR in the last 72 hours.  Invalid input(s): APPERANCEUR    Imaging: No results found.   Medications:   . sodium chloride    . sodium chloride     . docusate  sodium  100 mg Oral Daily  . gabapentin  100 mg Oral Daily  . heparin  5,000 Units Subcutaneous Q8H  . hydroxychloroquine  200 mg Oral Daily  . midodrine  10 mg Oral Q M,W,F  . mirtazapine  15 mg Oral QHS  . multivitamin  1 tablet Oral Daily  . pantoprazole  40 mg Oral Daily  . phenytoin  200 mg Oral BID  . sertraline  200 mg Oral Daily  . sevelamer carbonate  800-1,600 mg Oral QID  . simvastatin  40 mg Oral Daily   sodium chloride, sodium chloride, acetaminophen **OR** acetaminophen, albuterol, ALPRAZolam, alteplase, guaiFENesin, heparin, HYDROcodone-acetaminophen, ipratropium-albuterol, lidocaine (PF), lidocaine-prilocaine, ondansetron **OR** ondansetron (ZOFRAN) IV, pentafluoroprop-tetrafluoroeth, polyethylene glycol  Assessment/ Plan:  41 y.o. female  with a PMHx of ESRD on HD  TTS, depression, DVT, hypertension, history of lupus, who was admitted to [facility] on 10/23/2016 for evaluation of dysfunctional HERO graft.   1.  ESRD on HD TTS.  Patient tolerated dialysis quite well yesterday. No acute indication for dialysis today. Patient will resume her normal outpatient dialysis on Tuesday.  2.  Hyperkalemia.  Potassium down to 3.8 today. Continue to monitor as an outpatient.  3.  Secondary hyperparathyroidism.   recommend patient continue Renvela as an outpatient.  4.  Complication dialysis device. Left upper extremity AV graft has been declotted and function well with dialysis yesterday.   LOS: 1 Lovelle Deitrick 8/12/201810:24 AM

## 2016-10-26 NOTE — Progress Notes (Signed)
Pt to be discharged back to group home this afternoon. [name] dfrom the facility notified. She will transport.

## 2016-10-26 NOTE — Discharge Instructions (Signed)
[facility]:  Renal diet  DISCHARGE CONDITION:  Stable  ACTIVITY:  Activity as tolerated  OXYGEN:  Home Oxygen: No.   Oxygen Delivery: room air  DISCHARGE LOCATION:  assited living facity   ADDITIONAL DISCHARGE INSTRUCTION:   If you experience worsening of your admission symptoms, develop shortness of breath, life threatening emergency, suicidal or homicidal thoughts you must seek medical attention immediately by calling 911 or calling your MD immediately  if symptoms less severe.  You Must read complete instructions/literature along with all the possible adverse reactions/side effects for all the Medicines you take and that have been prescribed to you. Take any new Medicines after you have completely understood and accpet all the possible adverse reactions/side effects.   Please note  You were cared for by a hospitalist during your hospital stay. If you have any questions about your discharge medications or the care you received while you were in the hospital after you are discharged, you can call the unit and asked to speak with the hospitalist on call if the hospitalist that took care of you is not available. Once you are discharged, your primary care physician will handle any further medical issues. Please note that NO REFILLS for any discharge medications will be authorized once you are discharged, as it is imperative that you return to your primary care physician (or establish a relationship with a primary care physician if you do not have one) for your aftercare needs so that they can reassess your need for medications and monitor your lab values.

## 2016-10-26 NOTE — NC FL2 (Signed)
[place] LEVEL OF CARE SCREENING TOOL     IDENTIFICATION  Patient Name: [name] Birthdate: 08-Nov-1975 Sex: female Admission Date (Current Location): 10/23/2016  [place] and [place] Number:  Engineering geologist and Address:  [facility], [address], [place], [place] [address]      Provider Number: [id number]  Attending Physician Name and Address:  Dustin Flock, MD  Relative Name and Phone Number:  Legal Fransisca Connors [phone]    Current Level of Care: Hospital Recommended Level of Care: [facility] Prior Approval Number:    Date Approved/Denied:   PASRR Number:    Discharge Plan: Other (Comment) (Group Home/[facility])    Current Diagnoses: Patient Active Problem List   Diagnosis Date Noted  . Hyperkalemia 10/23/2016  . Complication of vascular access for dialysis 04/01/2016  . ESRD on dialysis ([place]) 04/01/2016  . Vaginal discharge 11/08/2013  . Vaginal odor 11/08/2013  . BV (bacterial vaginosis) 11/08/2013  . SWELLING, NECK 04/02/2007  . ASTHMA 03/15/2007  . FOOT PAIN, RIGHT 03/15/2007  . COUGH 03/15/2007  . MENTAL RETARDATION, MODERATE 01/19/2007  . STATUS, MENTAL, ALTERED 07/16/2006  . ANOREXIA 05/26/2006  . HYPOTENSION NOS 05/15/2006  . TACHYCARDIA 05/15/2006  . Lupus 04/01/2006  . ANEMIA-NOS 03/31/2006  . LEUKOCYTOSIS 03/31/2006  . DEPRESSION 03/31/2006  . MIGRAINE HEADACHE 03/31/2006  . PERIPHERAL NEUROPATHY 03/31/2006  . Essential hypertension 03/31/2006  . GERD 03/31/2006  . OSTEOARTHRITIS 03/31/2006  . HYPERGLYCEMIA 03/31/2006    Orientation RESPIRATION BLADDER Height & Weight     Self, Situation, Place  Normal Continent Weight: 100 lb 8.5 oz (45.6 kg) Height:  5\' 4"  (162.6 cm)  BEHAVIORAL SYMPTOMS/MOOD NEUROLOGICAL BOWEL NUTRITION STATUS      Continent Diet (Renal diet (ESRD))  AMBULATORY STATUS COMMUNICATION OF NEEDS Skin   Independent Verbally Surgical wounds  (Fistula)                       Personal Care Assistance Level of Assistance  Bathing, Feeding, Dressing Bathing Assistance: Independent Feeding assistance: Independent Dressing Assistance: Independent     Functional Limitations Info             SPECIAL CARE FACTORS FREQUENCY                       Contractures Contractures Info: Not present    Additional Factors Info  Code Status, Allergies Code Status Info: Full Allergies Info: Dust Mite Extract           Current Medications (10/26/2016):  This is the current hospital active medication list Current Facility-Administered Medications  Medication Dose Route Frequency Provider Last Rate Last Dose  . 0.9 %  sodium chloride infusion  100 mL Intravenous PRN Lateef, Munsoor, MD      . 0.9 %  sodium chloride infusion  100 mL Intravenous PRN Lateef, Munsoor, MD      . acetaminophen (TYLENOL) tablet 650 mg  650 mg Oral Q6H PRN Fritzi Mandes, MD       Or  . acetaminophen (TYLENOL) suppository 650 mg  650 mg Rectal Q6H PRN Fritzi Mandes, MD      . albuterol (PROVENTIL) (2.5 MG/3ML) 0.083% nebulizer solution 2.5 mg  2.5 mg Nebulization QID PRN Fritzi Mandes, MD      . ALPRAZolam Duanne Moron) tablet 0.25 mg  0.25 mg Oral BID PRN Fritzi Mandes, MD      . alteplase (CATHFLO ACTIVASE) injection 2  mg  2 mg Intracatheter Once PRN Lateef, Munsoor, MD      . docusate sodium (COLACE) capsule 100 mg  100 mg Oral Daily Fritzi Mandes, MD   100 mg at 10/25/16 0946  . gabapentin (NEURONTIN) capsule 100 mg  100 mg Oral Daily Fritzi Mandes, MD   100 mg at 10/25/16 0947  . guaiFENesin (ROBITUSSIN) 100 MG/5ML solution 200 mg  200 mg Oral TID PRN Fritzi Mandes, MD      . heparin injection 1,000 Units  1,000 Units Dialysis PRN Holley Raring, Munsoor, MD      . heparin injection 5,000 Units  5,000 Units Subcutaneous Q8H Fritzi Mandes, MD   5,000 Units at 10/26/16 0600  . HYDROcodone-acetaminophen (NORCO/VICODIN) 5-325 MG per tablet 2 tablet  2 tablet Oral Q4H PRN Fritzi Mandes, MD   2 tablet at 10/23/16 2232  . hydroxychloroquine (PLAQUENIL) tablet 200 mg  200 mg Oral Daily Fritzi Mandes, MD   200 mg at 10/25/16 1625  . ipratropium-albuterol (DUONEB) 0.5-2.5 (3) MG/3ML nebulizer solution 3 mL  3 mL Nebulization Q4H PRN Fritzi Mandes, MD      . lidocaine (PF) (XYLOCAINE) 1 % injection 5 mL  5 mL Intradermal PRN Lateef, Munsoor, MD      . lidocaine-prilocaine (EMLA) cream 1 application  1 application Topical PRN Lateef, Munsoor, MD      . midodrine (PROAMATINE) tablet 10 mg  10 mg Oral Q M,W,F Fritzi Mandes, MD   10 mg at 10/24/16 0909  . midodrine (PROAMATINE) tablet 20 mg  20 mg Oral Once Dustin Flock, MD      . mirtazapine (REMERON) tablet 15 mg  15 mg Oral QHS Fritzi Mandes, MD   15 mg at 10/25/16 2057  . multivitamin (RENA-VIT) tablet 1 tablet  1 tablet Oral Daily Fritzi Mandes, MD   Stopped at 10/25/16 1000  . ondansetron (ZOFRAN) tablet 4 mg  4 mg Oral Q6H PRN Fritzi Mandes, MD       Or  . ondansetron [facility]) injection 4 mg  4 mg Intravenous Q6H PRN Fritzi Mandes, MD      . pantoprazole (PROTONIX) EC tablet 40 mg  40 mg Oral Daily Fritzi Mandes, MD   40 mg at 10/25/16 0947  . pentafluoroprop-tetrafluoroeth (GEBAUERS) aerosol 1 application  1 application Topical PRN Lateef, Munsoor, MD      . phenytoin (DILANTIN) ER capsule 200 mg  200 mg Oral BID Fritzi Mandes, MD   200 mg at 10/25/16 2057  . polyethylene glycol (MIRALAX / GLYCOLAX) packet 17 g  17 g Oral Daily PRN Fritzi Mandes, MD      . sertraline (ZOLOFT) tablet 200 mg  200 mg Oral Daily Fritzi Mandes, MD   200 mg at 10/25/16 0947  . sevelamer carbonate (RENVELA) tablet 800-1,600 mg  800-1,600 mg Oral QID Fritzi Mandes, MD   800 mg at 10/25/16 2057  . simvastatin (ZOCOR) tablet 40 mg  40 mg Oral Daily Fritzi Mandes, MD   40 mg at 10/25/16 2202   Facility-Administered Medications Ordered in Other Encounters  Medication Dose Route Frequency Provider Last Rate Last Dose  . 0.9 %  sodium chloride infusion   Intravenous Once  Monia Sabal, PA-C      . ceFAZolin (ANCEF) IVPB 2 g/50 mL premix  2 g Intravenous Once Monia Sabal, PA-C         Discharge Medications: Medication List     TAKE these medications   albuterol (2.5 MG/3ML) 0.083% nebulizer solution Commonly  known as:  PROVENTIL Take 2.5 mg by nebulization 4 (four) times daily as needed (breathing).   ALPRAZolam 0.25 MG tablet Commonly known as:  XANAX Take 0.25 mg by mouth 2 (two) times daily as needed for anxiety.   aspirin EC 81 MG tablet Take 81 mg by mouth daily.   chlorhexidine 0.12 % solution Commonly known as:  PERIDEX Use as directed 10 mLs in the mouth or throat 2 (two) times daily.   clopidogrel 75 MG tablet Commonly known as:  PLAVIX Take 75 mg by mouth daily.   docusate sodium 100 MG capsule Commonly known as:  COLACE Take 100 mg by mouth daily.   gabapentin 100 MG capsule Commonly known as:  NEURONTIN Take 100 mg by mouth.   guaiFENesin 100 MG/5ML liquid Commonly known as:  ROBITUSSIN Take 200 mg by mouth 3 (three) times daily as needed for cough.   HECTOROL 4 MCG/2ML injection Generic drug:  doxercalciferol Inject into the vein.   HYDROcodone-acetaminophen 5-325 MG tablet Commonly known as:  NORCO/VICODIN Take 2 tablets by mouth every 4 (four) hours as needed.   hydroxychloroquine 200 MG tablet Commonly known as:  PLAQUENIL Take 200 mg by mouth daily.   ipratropium 0.02 % nebulizer solution Commonly known as:  ATROVENT Take 250 mcg by nebulization 4 (four) times daily as needed (breathing).   ipratropium-albuterol 0.5-2.5 (3) MG/3ML Soln Commonly known as:  DUONEB 3 mLs as needed.   lidocaine-prilocaine cream Commonly known as:  EMLA Apply 1 application topically as needed (for port access).   loratadine 10 MG tablet Commonly known as:  CLARITIN Take 10 mg by mouth daily as needed for allergies.   midodrine 10 MG tablet Commonly known as:  PROAMATINE Take 10 mg by mouth 2 (two)  times daily. Monday, Wednesday, and Friday before dialysis   mirtazapine 15 MG tablet Commonly known as:  REMERON 15 mg at bedtime.   multivitamin Tabs tablet Take 1 tablet by mouth daily.   ondansetron 8 MG tablet Commonly known as:  ZOFRAN Take by mouth as needed for nausea or vomiting.   pantoprazole 40 MG tablet Commonly known as:  PROTONIX Take 40 mg by mouth daily.   phenytoin 100 MG ER capsule Commonly known as:  DILANTIN Take 200 mg by mouth 2 (two) times daily.   polyethylene glycol packet Commonly known as:  MIRALAX / GLYCOLAX Take 17 g by mouth daily as needed for mild constipation.   sertraline 100 MG tablet Commonly known as:  ZOLOFT Take 200 mg by mouth daily.   sevelamer carbonate 800 MG tablet Commonly known as:  RENVELA Take 800-1,600 mg by mouth 4 (four) times daily. Takes 1600 mg with each meal and 800 mg with snack   simvastatin 40 MG tablet Commonly known as:  ZOCOR Take 40 mg by mouth daily.     Relevant Imaging Results:  Relevant Lab Results:   Additional Information SS#[id number]  Zettie Pho, LCSW

## 2016-10-26 NOTE — Progress Notes (Signed)
[name] comes from [facility] in [place]. ispoke with mary thomas at the faciliy. She can be reached at [phone] AFTER 1:30 TODAY

## 2016-10-26 NOTE — Discharge Summary (Signed)
[facility] [place] [facility], [place] y.o., DOB [birth year], MRN [id number]. Admission date: 10/23/2016 Discharge Date 10/26/2016 Primary MD Sinda Du, MD Admitting Physician Algernon Huxley, MD  Admission Diagnosis  LT arm shuntogram    End Stage Renal Fistula Declot  Discharge Diagnosis   Active Problems:   Hyperkalemia  End-stage renal disease History of essential hypertension the past but now has hypotension on midrodrine chronically Malfunctioning hemodialysis access History of lupus       Hospital Course  1. [name]  is a 41 y.o. female with a known history ofEnd-stage renal disease on hemodialysis, history of lupus, hypertension [facility] for evaluation of a malfunctioning dialysis access and preop labs found potassium of 6.9. No acute EKG changes. Internal medicine was consulted and patient is being admitted for acute hyperkalemia. Patient was admitted for hyperkalemia and had a temporary hemodialysis catheter placed. And underwent urgent dialysis. Postdialysis her potassium did normalize. For her malfunctioning hemodialysis access patient underwent Percutaneous transluminal angioplasty eft arm hero graft to 8 mm in the graft proper and 5 mm in the brachial artery at the level of the anastomosis using a Lutonix drug-eluting balloon. She tolerated the procedure without any complications. Post procedure of potassium was high again and had to be dialyzed. This morning her potassium is normal. She has been cleared by nephrology to be discharged,.             Consults  nephrology, vascular surgery  Significant Tests:  See full reports for all details     No results found.     Today   Subjective:   [name]  patient feels well denies any complaints  Objective:   Blood pressure (!) 83/39, pulse 79, temperature 98 F (36.7 C), temperature source Oral, resp. rate 18, height 5\' 4"  (1.626 m),  weight 100 lb 8.5 oz (45.6 kg), SpO2 100 %.  .  Intake/Output Summary (Last 24 hours) at 10/26/16 0906 Last data filed at 10/26/16 0403  Gross per 24 hour  Intake              120 ml  Output             1000 ml  Net             -880 ml    Exam VITAL SIGNS: Blood pressure (!) 83/39, pulse 79, temperature 98 F (36.7 C), temperature source Oral, resp. rate 18, height 5\' 4"  (1.626 m), weight 100 lb 8.5 oz (45.6 kg), SpO2 100 %.  GENERAL:  41 y.o.-year-old patient lying in the bed with no acute distress.  EYES: Pupils equal, round, reactive to light and accommodation. No scleral icterus. Extraocular muscles intact.  HEENT: Head atraumatic, normocephalic. Oropharynx and nasopharynx clear.  NECK:  Supple, no jugular venous distention. No thyroid enlargement, no tenderness.  LUNGS: Normal breath sounds bilaterally, no wheezing, rales,rhonchi or crepitation. No use of accessory muscles of respiration.  CARDIOVASCULAR: S1, S2 normal. No murmurs, rubs, or gallops.  ABDOMEN: Soft, nontender, nondistended. Bowel sounds present. No organomegaly or mass.  EXTREMITIES: No pedal edema, cyanosis, or clubbing.  NEUROLOGIC: Cranial nerves II through XII are intact. Muscle strength 5/5 in all extremities. Sensation intact. Gait not checked.  PSYCHIATRIC: The patient is alert and oriented x 3.  SKIN: No obvious rash, lesion, or ulcer.   Data Review     CBC w Diff: Lab Results  Component Value Date   WBC 9.5 10/25/2016  HGB 13.2 10/25/2016   HGB 10.4 (L) 02/03/2013   HCT 39.9 10/25/2016   HCT 31.0 (L) 02/03/2013   PLT 205 10/25/2016   PLT 346 02/03/2013   LYMPHOPCT 15 06/17/2013   LYMPHOPCT 24.8 02/03/2013   BANDSPCT 13 (H) 01/01/2008   MONOPCT 8 06/17/2013   MONOPCT 7.1 02/03/2013   EOSPCT 2 06/17/2013   EOSPCT 4.9 02/03/2013   BASOPCT 1 06/17/2013   BASOPCT 2.0 02/03/2013   CMP: Lab Results  Component Value Date   NA 136 10/26/2016   NA 137 02/18/2013   K 3.8 10/26/2016   K 4.9  07/06/2014   CL 90 (L) 10/26/2016   CL 100 02/18/2013   CO2 31 10/26/2016   CO2 33 (H) 02/18/2013   BUN 32 (H) 10/26/2016   BUN 9 02/18/2013   CREATININE 7.28 (H) 10/26/2016   CREATININE 1.93 (H) 02/18/2013   PROT 8.1 06/17/2013   ALBUMIN 3.7 06/17/2013   BILITOT 0.2 (L) 06/17/2013   ALKPHOS 92 06/17/2013   AST 17 06/17/2013   ALT 12 06/17/2013  .  Micro Results Recent Results (from the past 240 hour(s))  MRSA PCR Screening     Status: None   Collection Time: 10/23/16  7:46 PM  Result Value Ref Range Status   MRSA by PCR NEGATIVE NEGATIVE Final    Comment:        The GeneXpert MRSA Assay (FDA approved for NASAL specimens only), is one component of a comprehensive MRSA colonization surveillance program. It is not intended to diagnose MRSA infection nor to guide or monitor treatment for MRSA infections.         Code Status Orders        Start     Ordered   10/23/16 1248  Full code  Continuous     10/23/16 1247    Code Status History    Date Active Date Inactive Code Status Order ID Comments User Context   This patient has a current code status but no historical code status.          Follow-up Information    Sinda Du, MD Follow up in 1 week(s).   Specialty:  Pulmonary Disease Contact information: [place] [address] [phone]           Discharge Medications   Allergies as of 10/26/2016      Reactions   Dust Mite Extract Other (See Comments)   sneezing      Medication List    TAKE these medications   albuterol (2.5 MG/3ML) 0.083% nebulizer solution Commonly known as:  PROVENTIL Take 2.5 mg by nebulization 4 (four) times daily as needed (breathing).   ALPRAZolam 0.25 MG tablet Commonly known as:  XANAX Take 0.25 mg by mouth 2 (two) times daily as needed for anxiety.   aspirin EC 81 MG tablet Take 81 mg by mouth daily.   chlorhexidine 0.12 % solution Commonly known as:  PERIDEX Use as  directed 10 mLs in the mouth or throat 2 (two) times daily.   clopidogrel 75 MG tablet Commonly known as:  PLAVIX Take 75 mg by mouth daily.   docusate sodium 100 MG capsule Commonly known as:  COLACE Take 100 mg by mouth daily.   gabapentin 100 MG capsule Commonly known as:  NEURONTIN Take 100 mg by mouth.   guaiFENesin 100 MG/5ML liquid Commonly known as:  ROBITUSSIN Take 200 mg by mouth 3 (three) times daily as needed for cough.  HECTOROL 4 MCG/2ML injection Generic drug:  doxercalciferol Inject into the vein.   HYDROcodone-acetaminophen 5-325 MG tablet Commonly known as:  NORCO/VICODIN Take 2 tablets by mouth every 4 (four) hours as needed.   hydroxychloroquine 200 MG tablet Commonly known as:  PLAQUENIL Take 200 mg by mouth daily.   ipratropium 0.02 % nebulizer solution Commonly known as:  ATROVENT Take 250 mcg by nebulization 4 (four) times daily as needed (breathing).   ipratropium-albuterol 0.5-2.5 (3) MG/3ML Soln Commonly known as:  DUONEB 3 mLs as needed.   lidocaine-prilocaine cream Commonly known as:  EMLA Apply 1 application topically as needed (for port access).   loratadine 10 MG tablet Commonly known as:  CLARITIN Take 10 mg by mouth daily as needed for allergies.   midodrine 10 MG tablet Commonly known as:  PROAMATINE Take 10 mg by mouth 2 (two) times daily. Monday, Wednesday, and Friday before dialysis   mirtazapine 15 MG tablet Commonly known as:  REMERON 15 mg at bedtime.   multivitamin Tabs tablet Take 1 tablet by mouth daily.   ondansetron 8 MG tablet Commonly known as:  ZOFRAN Take by mouth as needed for nausea or vomiting.   pantoprazole 40 MG tablet Commonly known as:  PROTONIX Take 40 mg by mouth daily.   phenytoin 100 MG ER capsule Commonly known as:  DILANTIN Take 200 mg by mouth 2 (two) times daily.   polyethylene glycol packet Commonly known as:  MIRALAX / GLYCOLAX Take 17 g by mouth daily as needed for mild  constipation.   sertraline 100 MG tablet Commonly known as:  ZOLOFT Take 200 mg by mouth daily.   sevelamer carbonate 800 MG tablet Commonly known as:  RENVELA Take 800-1,600 mg by mouth 4 (four) times daily. Takes 1600 mg with each meal and 800 mg with snack   simvastatin 40 MG tablet Commonly known as:  ZOCOR Take 40 mg by mouth daily.          Total Time in preparing paper work, data evaluation and todays exam - 35 minutes  Dustin Flock M.D on 10/26/2016 at 9:06 AM  [facility]   Office  [phone]

## 2016-10-26 NOTE — Clinical Social Work Note (Addendum)
Patient will discharge to her family care home via facility transport today. CSW has confirmed transport with the facility, and they are in agreement. The CSW has placed a call to the After Hours line for [facility] to alert them of the discharge as this patient has a legal guardian through [place] county DSS. CSW is awaiting a call back from an on-call social worker. CSW will continue to follow pending additional discharge needs.  On-call social worker confirmed that patient can discharge. CSW is signing off. Santiago Bumpers, MSW, Latanya Presser [phone]

## 2016-10-27 ENCOUNTER — Encounter: Payer: Self-pay | Admitting: Vascular Surgery

## 2016-10-27 DIAGNOSIS — N2581 Secondary hyperparathyroidism of renal origin: Secondary | ICD-10-CM | POA: Diagnosis not present

## 2016-10-27 DIAGNOSIS — N186 End stage renal disease: Secondary | ICD-10-CM | POA: Diagnosis not present

## 2016-10-27 DIAGNOSIS — Z992 Dependence on renal dialysis: Secondary | ICD-10-CM | POA: Diagnosis not present

## 2016-10-27 DIAGNOSIS — D509 Iron deficiency anemia, unspecified: Secondary | ICD-10-CM | POA: Diagnosis not present

## 2016-10-27 DIAGNOSIS — D631 Anemia in chronic kidney disease: Secondary | ICD-10-CM | POA: Diagnosis not present

## 2016-10-29 ENCOUNTER — Encounter: Payer: Self-pay | Admitting: Vascular Surgery

## 2016-10-29 DIAGNOSIS — N186 End stage renal disease: Secondary | ICD-10-CM | POA: Diagnosis not present

## 2016-10-29 DIAGNOSIS — Z992 Dependence on renal dialysis: Secondary | ICD-10-CM | POA: Diagnosis not present

## 2016-10-29 DIAGNOSIS — D509 Iron deficiency anemia, unspecified: Secondary | ICD-10-CM | POA: Diagnosis not present

## 2016-10-29 DIAGNOSIS — D631 Anemia in chronic kidney disease: Secondary | ICD-10-CM | POA: Diagnosis not present

## 2016-10-29 DIAGNOSIS — N2581 Secondary hyperparathyroidism of renal origin: Secondary | ICD-10-CM | POA: Diagnosis not present

## 2016-10-30 ENCOUNTER — Encounter: Payer: Self-pay | Admitting: Vascular Surgery

## 2016-10-31 DIAGNOSIS — N186 End stage renal disease: Secondary | ICD-10-CM | POA: Diagnosis not present

## 2016-10-31 DIAGNOSIS — N2581 Secondary hyperparathyroidism of renal origin: Secondary | ICD-10-CM | POA: Diagnosis not present

## 2016-10-31 DIAGNOSIS — D509 Iron deficiency anemia, unspecified: Secondary | ICD-10-CM | POA: Diagnosis not present

## 2016-10-31 DIAGNOSIS — Z992 Dependence on renal dialysis: Secondary | ICD-10-CM | POA: Diagnosis not present

## 2016-10-31 DIAGNOSIS — D631 Anemia in chronic kidney disease: Secondary | ICD-10-CM | POA: Diagnosis not present

## 2016-11-03 ENCOUNTER — Encounter: Payer: Self-pay | Admitting: Vascular Surgery

## 2016-11-03 DIAGNOSIS — N186 End stage renal disease: Secondary | ICD-10-CM | POA: Diagnosis not present

## 2016-11-03 DIAGNOSIS — N2581 Secondary hyperparathyroidism of renal origin: Secondary | ICD-10-CM | POA: Diagnosis not present

## 2016-11-03 DIAGNOSIS — Z992 Dependence on renal dialysis: Secondary | ICD-10-CM | POA: Diagnosis not present

## 2016-11-03 DIAGNOSIS — D631 Anemia in chronic kidney disease: Secondary | ICD-10-CM | POA: Diagnosis not present

## 2016-11-03 DIAGNOSIS — D509 Iron deficiency anemia, unspecified: Secondary | ICD-10-CM | POA: Diagnosis not present

## 2016-11-05 DIAGNOSIS — D509 Iron deficiency anemia, unspecified: Secondary | ICD-10-CM | POA: Diagnosis not present

## 2016-11-05 DIAGNOSIS — N186 End stage renal disease: Secondary | ICD-10-CM | POA: Diagnosis not present

## 2016-11-05 DIAGNOSIS — D631 Anemia in chronic kidney disease: Secondary | ICD-10-CM | POA: Diagnosis not present

## 2016-11-05 DIAGNOSIS — Z992 Dependence on renal dialysis: Secondary | ICD-10-CM | POA: Diagnosis not present

## 2016-11-05 DIAGNOSIS — N2581 Secondary hyperparathyroidism of renal origin: Secondary | ICD-10-CM | POA: Diagnosis not present

## 2016-11-07 DIAGNOSIS — N186 End stage renal disease: Secondary | ICD-10-CM | POA: Diagnosis not present

## 2016-11-07 DIAGNOSIS — Z992 Dependence on renal dialysis: Secondary | ICD-10-CM | POA: Diagnosis not present

## 2016-11-07 DIAGNOSIS — D631 Anemia in chronic kidney disease: Secondary | ICD-10-CM | POA: Diagnosis not present

## 2016-11-07 DIAGNOSIS — D509 Iron deficiency anemia, unspecified: Secondary | ICD-10-CM | POA: Diagnosis not present

## 2016-11-07 DIAGNOSIS — N2581 Secondary hyperparathyroidism of renal origin: Secondary | ICD-10-CM | POA: Diagnosis not present

## 2016-11-10 DIAGNOSIS — N2581 Secondary hyperparathyroidism of renal origin: Secondary | ICD-10-CM | POA: Diagnosis not present

## 2016-11-10 DIAGNOSIS — D631 Anemia in chronic kidney disease: Secondary | ICD-10-CM | POA: Diagnosis not present

## 2016-11-10 DIAGNOSIS — Z992 Dependence on renal dialysis: Secondary | ICD-10-CM | POA: Diagnosis not present

## 2016-11-10 DIAGNOSIS — D509 Iron deficiency anemia, unspecified: Secondary | ICD-10-CM | POA: Diagnosis not present

## 2016-11-10 DIAGNOSIS — N186 End stage renal disease: Secondary | ICD-10-CM | POA: Diagnosis not present

## 2016-11-12 DIAGNOSIS — Z992 Dependence on renal dialysis: Secondary | ICD-10-CM | POA: Diagnosis not present

## 2016-11-12 DIAGNOSIS — D631 Anemia in chronic kidney disease: Secondary | ICD-10-CM | POA: Diagnosis not present

## 2016-11-12 DIAGNOSIS — N2581 Secondary hyperparathyroidism of renal origin: Secondary | ICD-10-CM | POA: Diagnosis not present

## 2016-11-12 DIAGNOSIS — N186 End stage renal disease: Secondary | ICD-10-CM | POA: Diagnosis not present

## 2016-11-12 DIAGNOSIS — D509 Iron deficiency anemia, unspecified: Secondary | ICD-10-CM | POA: Diagnosis not present

## 2016-11-14 DIAGNOSIS — Z992 Dependence on renal dialysis: Secondary | ICD-10-CM | POA: Diagnosis not present

## 2016-11-14 DIAGNOSIS — N2581 Secondary hyperparathyroidism of renal origin: Secondary | ICD-10-CM | POA: Diagnosis not present

## 2016-11-14 DIAGNOSIS — N186 End stage renal disease: Secondary | ICD-10-CM | POA: Diagnosis not present

## 2016-11-14 DIAGNOSIS — D631 Anemia in chronic kidney disease: Secondary | ICD-10-CM | POA: Diagnosis not present

## 2016-11-14 DIAGNOSIS — D509 Iron deficiency anemia, unspecified: Secondary | ICD-10-CM | POA: Diagnosis not present

## 2016-11-17 DIAGNOSIS — D631 Anemia in chronic kidney disease: Secondary | ICD-10-CM | POA: Diagnosis not present

## 2016-11-17 DIAGNOSIS — N2581 Secondary hyperparathyroidism of renal origin: Secondary | ICD-10-CM | POA: Diagnosis not present

## 2016-11-17 DIAGNOSIS — Z992 Dependence on renal dialysis: Secondary | ICD-10-CM | POA: Diagnosis not present

## 2016-11-17 DIAGNOSIS — N186 End stage renal disease: Secondary | ICD-10-CM | POA: Diagnosis not present

## 2016-11-17 DIAGNOSIS — D509 Iron deficiency anemia, unspecified: Secondary | ICD-10-CM | POA: Diagnosis not present

## 2016-11-19 DIAGNOSIS — D509 Iron deficiency anemia, unspecified: Secondary | ICD-10-CM | POA: Diagnosis not present

## 2016-11-19 DIAGNOSIS — D631 Anemia in chronic kidney disease: Secondary | ICD-10-CM | POA: Diagnosis not present

## 2016-11-19 DIAGNOSIS — N2581 Secondary hyperparathyroidism of renal origin: Secondary | ICD-10-CM | POA: Diagnosis not present

## 2016-11-19 DIAGNOSIS — N186 End stage renal disease: Secondary | ICD-10-CM | POA: Diagnosis not present

## 2016-11-19 DIAGNOSIS — Z992 Dependence on renal dialysis: Secondary | ICD-10-CM | POA: Diagnosis not present

## 2016-11-21 DIAGNOSIS — D631 Anemia in chronic kidney disease: Secondary | ICD-10-CM | POA: Diagnosis not present

## 2016-11-21 DIAGNOSIS — D509 Iron deficiency anemia, unspecified: Secondary | ICD-10-CM | POA: Diagnosis not present

## 2016-11-21 DIAGNOSIS — N186 End stage renal disease: Secondary | ICD-10-CM | POA: Diagnosis not present

## 2016-11-21 DIAGNOSIS — Z992 Dependence on renal dialysis: Secondary | ICD-10-CM | POA: Diagnosis not present

## 2016-11-21 DIAGNOSIS — N2581 Secondary hyperparathyroidism of renal origin: Secondary | ICD-10-CM | POA: Diagnosis not present

## 2016-11-24 DIAGNOSIS — N186 End stage renal disease: Secondary | ICD-10-CM | POA: Diagnosis not present

## 2016-11-24 DIAGNOSIS — Z992 Dependence on renal dialysis: Secondary | ICD-10-CM | POA: Diagnosis not present

## 2016-11-24 DIAGNOSIS — D631 Anemia in chronic kidney disease: Secondary | ICD-10-CM | POA: Diagnosis not present

## 2016-11-24 DIAGNOSIS — D509 Iron deficiency anemia, unspecified: Secondary | ICD-10-CM | POA: Diagnosis not present

## 2016-11-24 DIAGNOSIS — N2581 Secondary hyperparathyroidism of renal origin: Secondary | ICD-10-CM | POA: Diagnosis not present

## 2016-11-26 DIAGNOSIS — D631 Anemia in chronic kidney disease: Secondary | ICD-10-CM | POA: Diagnosis not present

## 2016-11-26 DIAGNOSIS — N2581 Secondary hyperparathyroidism of renal origin: Secondary | ICD-10-CM | POA: Diagnosis not present

## 2016-11-26 DIAGNOSIS — Z992 Dependence on renal dialysis: Secondary | ICD-10-CM | POA: Diagnosis not present

## 2016-11-26 DIAGNOSIS — N186 End stage renal disease: Secondary | ICD-10-CM | POA: Diagnosis not present

## 2016-11-26 DIAGNOSIS — D509 Iron deficiency anemia, unspecified: Secondary | ICD-10-CM | POA: Diagnosis not present

## 2016-11-28 DIAGNOSIS — Z992 Dependence on renal dialysis: Secondary | ICD-10-CM | POA: Diagnosis not present

## 2016-11-28 DIAGNOSIS — N186 End stage renal disease: Secondary | ICD-10-CM | POA: Diagnosis not present

## 2016-11-28 DIAGNOSIS — N2581 Secondary hyperparathyroidism of renal origin: Secondary | ICD-10-CM | POA: Diagnosis not present

## 2016-11-28 DIAGNOSIS — D509 Iron deficiency anemia, unspecified: Secondary | ICD-10-CM | POA: Diagnosis not present

## 2016-11-28 DIAGNOSIS — D631 Anemia in chronic kidney disease: Secondary | ICD-10-CM | POA: Diagnosis not present

## 2016-12-01 DIAGNOSIS — Z992 Dependence on renal dialysis: Secondary | ICD-10-CM | POA: Diagnosis not present

## 2016-12-01 DIAGNOSIS — N2581 Secondary hyperparathyroidism of renal origin: Secondary | ICD-10-CM | POA: Diagnosis not present

## 2016-12-01 DIAGNOSIS — N186 End stage renal disease: Secondary | ICD-10-CM | POA: Diagnosis not present

## 2016-12-01 DIAGNOSIS — D509 Iron deficiency anemia, unspecified: Secondary | ICD-10-CM | POA: Diagnosis not present

## 2016-12-01 DIAGNOSIS — D631 Anemia in chronic kidney disease: Secondary | ICD-10-CM | POA: Diagnosis not present

## 2016-12-03 DIAGNOSIS — D509 Iron deficiency anemia, unspecified: Secondary | ICD-10-CM | POA: Diagnosis not present

## 2016-12-03 DIAGNOSIS — D631 Anemia in chronic kidney disease: Secondary | ICD-10-CM | POA: Diagnosis not present

## 2016-12-03 DIAGNOSIS — N2581 Secondary hyperparathyroidism of renal origin: Secondary | ICD-10-CM | POA: Diagnosis not present

## 2016-12-03 DIAGNOSIS — Z992 Dependence on renal dialysis: Secondary | ICD-10-CM | POA: Diagnosis not present

## 2016-12-03 DIAGNOSIS — N186 End stage renal disease: Secondary | ICD-10-CM | POA: Diagnosis not present

## 2016-12-05 DIAGNOSIS — D509 Iron deficiency anemia, unspecified: Secondary | ICD-10-CM | POA: Diagnosis not present

## 2016-12-05 DIAGNOSIS — N186 End stage renal disease: Secondary | ICD-10-CM | POA: Diagnosis not present

## 2016-12-05 DIAGNOSIS — Z992 Dependence on renal dialysis: Secondary | ICD-10-CM | POA: Diagnosis not present

## 2016-12-05 DIAGNOSIS — D631 Anemia in chronic kidney disease: Secondary | ICD-10-CM | POA: Diagnosis not present

## 2016-12-05 DIAGNOSIS — N2581 Secondary hyperparathyroidism of renal origin: Secondary | ICD-10-CM | POA: Diagnosis not present

## 2016-12-08 DIAGNOSIS — D509 Iron deficiency anemia, unspecified: Secondary | ICD-10-CM | POA: Diagnosis not present

## 2016-12-08 DIAGNOSIS — N186 End stage renal disease: Secondary | ICD-10-CM | POA: Diagnosis not present

## 2016-12-08 DIAGNOSIS — N2581 Secondary hyperparathyroidism of renal origin: Secondary | ICD-10-CM | POA: Diagnosis not present

## 2016-12-08 DIAGNOSIS — D631 Anemia in chronic kidney disease: Secondary | ICD-10-CM | POA: Diagnosis not present

## 2016-12-08 DIAGNOSIS — Z992 Dependence on renal dialysis: Secondary | ICD-10-CM | POA: Diagnosis not present

## 2016-12-10 DIAGNOSIS — N186 End stage renal disease: Secondary | ICD-10-CM | POA: Diagnosis not present

## 2016-12-10 DIAGNOSIS — N2581 Secondary hyperparathyroidism of renal origin: Secondary | ICD-10-CM | POA: Diagnosis not present

## 2016-12-10 DIAGNOSIS — D509 Iron deficiency anemia, unspecified: Secondary | ICD-10-CM | POA: Diagnosis not present

## 2016-12-10 DIAGNOSIS — Z992 Dependence on renal dialysis: Secondary | ICD-10-CM | POA: Diagnosis not present

## 2016-12-10 DIAGNOSIS — D631 Anemia in chronic kidney disease: Secondary | ICD-10-CM | POA: Diagnosis not present

## 2016-12-12 DIAGNOSIS — N2581 Secondary hyperparathyroidism of renal origin: Secondary | ICD-10-CM | POA: Diagnosis not present

## 2016-12-12 DIAGNOSIS — D631 Anemia in chronic kidney disease: Secondary | ICD-10-CM | POA: Diagnosis not present

## 2016-12-12 DIAGNOSIS — N186 End stage renal disease: Secondary | ICD-10-CM | POA: Diagnosis not present

## 2016-12-12 DIAGNOSIS — Z992 Dependence on renal dialysis: Secondary | ICD-10-CM | POA: Diagnosis not present

## 2016-12-12 DIAGNOSIS — D509 Iron deficiency anemia, unspecified: Secondary | ICD-10-CM | POA: Diagnosis not present

## 2016-12-14 DIAGNOSIS — Z992 Dependence on renal dialysis: Secondary | ICD-10-CM | POA: Diagnosis not present

## 2016-12-14 DIAGNOSIS — N186 End stage renal disease: Secondary | ICD-10-CM | POA: Diagnosis not present

## 2016-12-15 DIAGNOSIS — Z992 Dependence on renal dialysis: Secondary | ICD-10-CM | POA: Diagnosis not present

## 2016-12-15 DIAGNOSIS — N186 End stage renal disease: Secondary | ICD-10-CM | POA: Diagnosis not present

## 2016-12-15 DIAGNOSIS — D509 Iron deficiency anemia, unspecified: Secondary | ICD-10-CM | POA: Diagnosis not present

## 2016-12-15 DIAGNOSIS — Z23 Encounter for immunization: Secondary | ICD-10-CM | POA: Diagnosis not present

## 2016-12-15 DIAGNOSIS — N2581 Secondary hyperparathyroidism of renal origin: Secondary | ICD-10-CM | POA: Diagnosis not present

## 2016-12-17 DIAGNOSIS — N2581 Secondary hyperparathyroidism of renal origin: Secondary | ICD-10-CM | POA: Diagnosis not present

## 2016-12-17 DIAGNOSIS — D509 Iron deficiency anemia, unspecified: Secondary | ICD-10-CM | POA: Diagnosis not present

## 2016-12-17 DIAGNOSIS — Z23 Encounter for immunization: Secondary | ICD-10-CM | POA: Diagnosis not present

## 2016-12-17 DIAGNOSIS — Z992 Dependence on renal dialysis: Secondary | ICD-10-CM | POA: Diagnosis not present

## 2016-12-17 DIAGNOSIS — N186 End stage renal disease: Secondary | ICD-10-CM | POA: Diagnosis not present

## 2016-12-19 DIAGNOSIS — N186 End stage renal disease: Secondary | ICD-10-CM | POA: Diagnosis not present

## 2016-12-19 DIAGNOSIS — N2581 Secondary hyperparathyroidism of renal origin: Secondary | ICD-10-CM | POA: Diagnosis not present

## 2016-12-19 DIAGNOSIS — Z992 Dependence on renal dialysis: Secondary | ICD-10-CM | POA: Diagnosis not present

## 2016-12-19 DIAGNOSIS — D509 Iron deficiency anemia, unspecified: Secondary | ICD-10-CM | POA: Diagnosis not present

## 2016-12-19 DIAGNOSIS — Z23 Encounter for immunization: Secondary | ICD-10-CM | POA: Diagnosis not present

## 2016-12-22 DIAGNOSIS — N2581 Secondary hyperparathyroidism of renal origin: Secondary | ICD-10-CM | POA: Diagnosis not present

## 2016-12-22 DIAGNOSIS — Z23 Encounter for immunization: Secondary | ICD-10-CM | POA: Diagnosis not present

## 2016-12-22 DIAGNOSIS — N186 End stage renal disease: Secondary | ICD-10-CM | POA: Diagnosis not present

## 2016-12-22 DIAGNOSIS — E119 Type 2 diabetes mellitus without complications: Secondary | ICD-10-CM | POA: Diagnosis not present

## 2016-12-22 DIAGNOSIS — Z992 Dependence on renal dialysis: Secondary | ICD-10-CM | POA: Diagnosis not present

## 2016-12-22 DIAGNOSIS — D509 Iron deficiency anemia, unspecified: Secondary | ICD-10-CM | POA: Diagnosis not present

## 2016-12-24 DIAGNOSIS — Z23 Encounter for immunization: Secondary | ICD-10-CM | POA: Diagnosis not present

## 2016-12-24 DIAGNOSIS — Z992 Dependence on renal dialysis: Secondary | ICD-10-CM | POA: Diagnosis not present

## 2016-12-24 DIAGNOSIS — N2581 Secondary hyperparathyroidism of renal origin: Secondary | ICD-10-CM | POA: Diagnosis not present

## 2016-12-24 DIAGNOSIS — N186 End stage renal disease: Secondary | ICD-10-CM | POA: Diagnosis not present

## 2016-12-24 DIAGNOSIS — D509 Iron deficiency anemia, unspecified: Secondary | ICD-10-CM | POA: Diagnosis not present

## 2016-12-27 DIAGNOSIS — D509 Iron deficiency anemia, unspecified: Secondary | ICD-10-CM | POA: Diagnosis not present

## 2016-12-27 DIAGNOSIS — N186 End stage renal disease: Secondary | ICD-10-CM | POA: Diagnosis not present

## 2016-12-27 DIAGNOSIS — Z23 Encounter for immunization: Secondary | ICD-10-CM | POA: Diagnosis not present

## 2016-12-27 DIAGNOSIS — N2581 Secondary hyperparathyroidism of renal origin: Secondary | ICD-10-CM | POA: Diagnosis not present

## 2016-12-27 DIAGNOSIS — Z992 Dependence on renal dialysis: Secondary | ICD-10-CM | POA: Diagnosis not present

## 2016-12-29 DIAGNOSIS — D509 Iron deficiency anemia, unspecified: Secondary | ICD-10-CM | POA: Diagnosis not present

## 2016-12-29 DIAGNOSIS — N186 End stage renal disease: Secondary | ICD-10-CM | POA: Diagnosis not present

## 2016-12-29 DIAGNOSIS — Z992 Dependence on renal dialysis: Secondary | ICD-10-CM | POA: Diagnosis not present

## 2016-12-29 DIAGNOSIS — Z23 Encounter for immunization: Secondary | ICD-10-CM | POA: Diagnosis not present

## 2016-12-29 DIAGNOSIS — N2581 Secondary hyperparathyroidism of renal origin: Secondary | ICD-10-CM | POA: Diagnosis not present

## 2016-12-31 DIAGNOSIS — Z992 Dependence on renal dialysis: Secondary | ICD-10-CM | POA: Diagnosis not present

## 2016-12-31 DIAGNOSIS — D509 Iron deficiency anemia, unspecified: Secondary | ICD-10-CM | POA: Diagnosis not present

## 2016-12-31 DIAGNOSIS — N186 End stage renal disease: Secondary | ICD-10-CM | POA: Diagnosis not present

## 2016-12-31 DIAGNOSIS — N2581 Secondary hyperparathyroidism of renal origin: Secondary | ICD-10-CM | POA: Diagnosis not present

## 2016-12-31 DIAGNOSIS — Z23 Encounter for immunization: Secondary | ICD-10-CM | POA: Diagnosis not present

## 2017-01-02 DIAGNOSIS — D509 Iron deficiency anemia, unspecified: Secondary | ICD-10-CM | POA: Diagnosis not present

## 2017-01-02 DIAGNOSIS — N2581 Secondary hyperparathyroidism of renal origin: Secondary | ICD-10-CM | POA: Diagnosis not present

## 2017-01-02 DIAGNOSIS — N186 End stage renal disease: Secondary | ICD-10-CM | POA: Diagnosis not present

## 2017-01-02 DIAGNOSIS — Z23 Encounter for immunization: Secondary | ICD-10-CM | POA: Diagnosis not present

## 2017-01-02 DIAGNOSIS — Z992 Dependence on renal dialysis: Secondary | ICD-10-CM | POA: Diagnosis not present

## 2017-01-05 DIAGNOSIS — N186 End stage renal disease: Secondary | ICD-10-CM | POA: Diagnosis not present

## 2017-01-05 DIAGNOSIS — N2581 Secondary hyperparathyroidism of renal origin: Secondary | ICD-10-CM | POA: Diagnosis not present

## 2017-01-05 DIAGNOSIS — Z992 Dependence on renal dialysis: Secondary | ICD-10-CM | POA: Diagnosis not present

## 2017-01-05 DIAGNOSIS — Z23 Encounter for immunization: Secondary | ICD-10-CM | POA: Diagnosis not present

## 2017-01-05 DIAGNOSIS — D509 Iron deficiency anemia, unspecified: Secondary | ICD-10-CM | POA: Diagnosis not present

## 2017-01-07 DIAGNOSIS — Z23 Encounter for immunization: Secondary | ICD-10-CM | POA: Diagnosis not present

## 2017-01-07 DIAGNOSIS — D509 Iron deficiency anemia, unspecified: Secondary | ICD-10-CM | POA: Diagnosis not present

## 2017-01-07 DIAGNOSIS — N186 End stage renal disease: Secondary | ICD-10-CM | POA: Diagnosis not present

## 2017-01-07 DIAGNOSIS — Z992 Dependence on renal dialysis: Secondary | ICD-10-CM | POA: Diagnosis not present

## 2017-01-07 DIAGNOSIS — N2581 Secondary hyperparathyroidism of renal origin: Secondary | ICD-10-CM | POA: Diagnosis not present

## 2017-01-09 DIAGNOSIS — N186 End stage renal disease: Secondary | ICD-10-CM | POA: Diagnosis not present

## 2017-01-09 DIAGNOSIS — Z23 Encounter for immunization: Secondary | ICD-10-CM | POA: Diagnosis not present

## 2017-01-09 DIAGNOSIS — D509 Iron deficiency anemia, unspecified: Secondary | ICD-10-CM | POA: Diagnosis not present

## 2017-01-09 DIAGNOSIS — Z992 Dependence on renal dialysis: Secondary | ICD-10-CM | POA: Diagnosis not present

## 2017-01-09 DIAGNOSIS — N2581 Secondary hyperparathyroidism of renal origin: Secondary | ICD-10-CM | POA: Diagnosis not present

## 2017-01-12 DIAGNOSIS — Z23 Encounter for immunization: Secondary | ICD-10-CM | POA: Diagnosis not present

## 2017-01-12 DIAGNOSIS — N2581 Secondary hyperparathyroidism of renal origin: Secondary | ICD-10-CM | POA: Diagnosis not present

## 2017-01-12 DIAGNOSIS — N186 End stage renal disease: Secondary | ICD-10-CM | POA: Diagnosis not present

## 2017-01-12 DIAGNOSIS — Z992 Dependence on renal dialysis: Secondary | ICD-10-CM | POA: Diagnosis not present

## 2017-01-12 DIAGNOSIS — D509 Iron deficiency anemia, unspecified: Secondary | ICD-10-CM | POA: Diagnosis not present

## 2017-01-13 DIAGNOSIS — N186 End stage renal disease: Secondary | ICD-10-CM | POA: Diagnosis not present

## 2017-01-13 DIAGNOSIS — Z992 Dependence on renal dialysis: Secondary | ICD-10-CM | POA: Diagnosis not present

## 2017-01-14 DIAGNOSIS — Z992 Dependence on renal dialysis: Secondary | ICD-10-CM | POA: Diagnosis not present

## 2017-01-14 DIAGNOSIS — N2581 Secondary hyperparathyroidism of renal origin: Secondary | ICD-10-CM | POA: Diagnosis not present

## 2017-01-14 DIAGNOSIS — Z23 Encounter for immunization: Secondary | ICD-10-CM | POA: Diagnosis not present

## 2017-01-14 DIAGNOSIS — D509 Iron deficiency anemia, unspecified: Secondary | ICD-10-CM | POA: Diagnosis not present

## 2017-01-14 DIAGNOSIS — N186 End stage renal disease: Secondary | ICD-10-CM | POA: Diagnosis not present

## 2017-01-16 DIAGNOSIS — N2581 Secondary hyperparathyroidism of renal origin: Secondary | ICD-10-CM | POA: Diagnosis not present

## 2017-01-16 DIAGNOSIS — D631 Anemia in chronic kidney disease: Secondary | ICD-10-CM | POA: Diagnosis not present

## 2017-01-16 DIAGNOSIS — N186 End stage renal disease: Secondary | ICD-10-CM | POA: Diagnosis not present

## 2017-01-16 DIAGNOSIS — Z992 Dependence on renal dialysis: Secondary | ICD-10-CM | POA: Diagnosis not present

## 2017-01-16 DIAGNOSIS — D509 Iron deficiency anemia, unspecified: Secondary | ICD-10-CM | POA: Diagnosis not present

## 2017-01-19 DIAGNOSIS — N186 End stage renal disease: Secondary | ICD-10-CM | POA: Diagnosis not present

## 2017-01-19 DIAGNOSIS — D631 Anemia in chronic kidney disease: Secondary | ICD-10-CM | POA: Diagnosis not present

## 2017-01-19 DIAGNOSIS — Z992 Dependence on renal dialysis: Secondary | ICD-10-CM | POA: Diagnosis not present

## 2017-01-19 DIAGNOSIS — N2581 Secondary hyperparathyroidism of renal origin: Secondary | ICD-10-CM | POA: Diagnosis not present

## 2017-01-19 DIAGNOSIS — D509 Iron deficiency anemia, unspecified: Secondary | ICD-10-CM | POA: Diagnosis not present

## 2017-01-21 DIAGNOSIS — Z992 Dependence on renal dialysis: Secondary | ICD-10-CM | POA: Diagnosis not present

## 2017-01-21 DIAGNOSIS — N2581 Secondary hyperparathyroidism of renal origin: Secondary | ICD-10-CM | POA: Diagnosis not present

## 2017-01-21 DIAGNOSIS — D631 Anemia in chronic kidney disease: Secondary | ICD-10-CM | POA: Diagnosis not present

## 2017-01-21 DIAGNOSIS — D509 Iron deficiency anemia, unspecified: Secondary | ICD-10-CM | POA: Diagnosis not present

## 2017-01-21 DIAGNOSIS — N186 End stage renal disease: Secondary | ICD-10-CM | POA: Diagnosis not present

## 2017-01-23 DIAGNOSIS — N2581 Secondary hyperparathyroidism of renal origin: Secondary | ICD-10-CM | POA: Diagnosis not present

## 2017-01-23 DIAGNOSIS — N186 End stage renal disease: Secondary | ICD-10-CM | POA: Diagnosis not present

## 2017-01-23 DIAGNOSIS — D509 Iron deficiency anemia, unspecified: Secondary | ICD-10-CM | POA: Diagnosis not present

## 2017-01-23 DIAGNOSIS — Z992 Dependence on renal dialysis: Secondary | ICD-10-CM | POA: Diagnosis not present

## 2017-01-23 DIAGNOSIS — D631 Anemia in chronic kidney disease: Secondary | ICD-10-CM | POA: Diagnosis not present

## 2017-01-26 DIAGNOSIS — Z992 Dependence on renal dialysis: Secondary | ICD-10-CM | POA: Diagnosis not present

## 2017-01-26 DIAGNOSIS — N2581 Secondary hyperparathyroidism of renal origin: Secondary | ICD-10-CM | POA: Diagnosis not present

## 2017-01-26 DIAGNOSIS — D509 Iron deficiency anemia, unspecified: Secondary | ICD-10-CM | POA: Diagnosis not present

## 2017-01-26 DIAGNOSIS — N186 End stage renal disease: Secondary | ICD-10-CM | POA: Diagnosis not present

## 2017-01-26 DIAGNOSIS — D631 Anemia in chronic kidney disease: Secondary | ICD-10-CM | POA: Diagnosis not present

## 2017-01-28 DIAGNOSIS — D631 Anemia in chronic kidney disease: Secondary | ICD-10-CM | POA: Diagnosis not present

## 2017-01-28 DIAGNOSIS — D509 Iron deficiency anemia, unspecified: Secondary | ICD-10-CM | POA: Diagnosis not present

## 2017-01-28 DIAGNOSIS — Z992 Dependence on renal dialysis: Secondary | ICD-10-CM | POA: Diagnosis not present

## 2017-01-28 DIAGNOSIS — N2581 Secondary hyperparathyroidism of renal origin: Secondary | ICD-10-CM | POA: Diagnosis not present

## 2017-01-28 DIAGNOSIS — N186 End stage renal disease: Secondary | ICD-10-CM | POA: Diagnosis not present

## 2017-01-30 DIAGNOSIS — Z992 Dependence on renal dialysis: Secondary | ICD-10-CM | POA: Diagnosis not present

## 2017-01-30 DIAGNOSIS — N2581 Secondary hyperparathyroidism of renal origin: Secondary | ICD-10-CM | POA: Diagnosis not present

## 2017-01-30 DIAGNOSIS — D631 Anemia in chronic kidney disease: Secondary | ICD-10-CM | POA: Diagnosis not present

## 2017-01-30 DIAGNOSIS — D509 Iron deficiency anemia, unspecified: Secondary | ICD-10-CM | POA: Diagnosis not present

## 2017-01-30 DIAGNOSIS — N186 End stage renal disease: Secondary | ICD-10-CM | POA: Diagnosis not present

## 2017-02-02 DIAGNOSIS — D509 Iron deficiency anemia, unspecified: Secondary | ICD-10-CM | POA: Diagnosis not present

## 2017-02-02 DIAGNOSIS — D631 Anemia in chronic kidney disease: Secondary | ICD-10-CM | POA: Diagnosis not present

## 2017-02-02 DIAGNOSIS — N2581 Secondary hyperparathyroidism of renal origin: Secondary | ICD-10-CM | POA: Diagnosis not present

## 2017-02-02 DIAGNOSIS — Z992 Dependence on renal dialysis: Secondary | ICD-10-CM | POA: Diagnosis not present

## 2017-02-02 DIAGNOSIS — N186 End stage renal disease: Secondary | ICD-10-CM | POA: Diagnosis not present

## 2017-02-04 DIAGNOSIS — N186 End stage renal disease: Secondary | ICD-10-CM | POA: Diagnosis not present

## 2017-02-04 DIAGNOSIS — D631 Anemia in chronic kidney disease: Secondary | ICD-10-CM | POA: Diagnosis not present

## 2017-02-04 DIAGNOSIS — N2581 Secondary hyperparathyroidism of renal origin: Secondary | ICD-10-CM | POA: Diagnosis not present

## 2017-02-04 DIAGNOSIS — Z992 Dependence on renal dialysis: Secondary | ICD-10-CM | POA: Diagnosis not present

## 2017-02-04 DIAGNOSIS — D509 Iron deficiency anemia, unspecified: Secondary | ICD-10-CM | POA: Diagnosis not present

## 2017-02-06 DIAGNOSIS — N186 End stage renal disease: Secondary | ICD-10-CM | POA: Diagnosis not present

## 2017-02-06 DIAGNOSIS — N2581 Secondary hyperparathyroidism of renal origin: Secondary | ICD-10-CM | POA: Diagnosis not present

## 2017-02-06 DIAGNOSIS — D631 Anemia in chronic kidney disease: Secondary | ICD-10-CM | POA: Diagnosis not present

## 2017-02-06 DIAGNOSIS — Z992 Dependence on renal dialysis: Secondary | ICD-10-CM | POA: Diagnosis not present

## 2017-02-06 DIAGNOSIS — D509 Iron deficiency anemia, unspecified: Secondary | ICD-10-CM | POA: Diagnosis not present

## 2017-02-09 DIAGNOSIS — N2581 Secondary hyperparathyroidism of renal origin: Secondary | ICD-10-CM | POA: Diagnosis not present

## 2017-02-09 DIAGNOSIS — D509 Iron deficiency anemia, unspecified: Secondary | ICD-10-CM | POA: Diagnosis not present

## 2017-02-09 DIAGNOSIS — D631 Anemia in chronic kidney disease: Secondary | ICD-10-CM | POA: Diagnosis not present

## 2017-02-09 DIAGNOSIS — N186 End stage renal disease: Secondary | ICD-10-CM | POA: Diagnosis not present

## 2017-02-09 DIAGNOSIS — Z992 Dependence on renal dialysis: Secondary | ICD-10-CM | POA: Diagnosis not present

## 2017-02-11 DIAGNOSIS — Z992 Dependence on renal dialysis: Secondary | ICD-10-CM | POA: Diagnosis not present

## 2017-02-11 DIAGNOSIS — N2581 Secondary hyperparathyroidism of renal origin: Secondary | ICD-10-CM | POA: Diagnosis not present

## 2017-02-11 DIAGNOSIS — N186 End stage renal disease: Secondary | ICD-10-CM | POA: Diagnosis not present

## 2017-02-11 DIAGNOSIS — D509 Iron deficiency anemia, unspecified: Secondary | ICD-10-CM | POA: Diagnosis not present

## 2017-02-11 DIAGNOSIS — D631 Anemia in chronic kidney disease: Secondary | ICD-10-CM | POA: Diagnosis not present

## 2017-02-13 DIAGNOSIS — D631 Anemia in chronic kidney disease: Secondary | ICD-10-CM | POA: Diagnosis not present

## 2017-02-13 DIAGNOSIS — N186 End stage renal disease: Secondary | ICD-10-CM | POA: Diagnosis not present

## 2017-02-13 DIAGNOSIS — N2581 Secondary hyperparathyroidism of renal origin: Secondary | ICD-10-CM | POA: Diagnosis not present

## 2017-02-13 DIAGNOSIS — Z992 Dependence on renal dialysis: Secondary | ICD-10-CM | POA: Diagnosis not present

## 2017-02-13 DIAGNOSIS — D509 Iron deficiency anemia, unspecified: Secondary | ICD-10-CM | POA: Diagnosis not present

## 2017-02-16 DIAGNOSIS — N2581 Secondary hyperparathyroidism of renal origin: Secondary | ICD-10-CM | POA: Diagnosis not present

## 2017-02-16 DIAGNOSIS — N186 End stage renal disease: Secondary | ICD-10-CM | POA: Diagnosis not present

## 2017-02-16 DIAGNOSIS — Z992 Dependence on renal dialysis: Secondary | ICD-10-CM | POA: Diagnosis not present

## 2017-02-16 DIAGNOSIS — D509 Iron deficiency anemia, unspecified: Secondary | ICD-10-CM | POA: Diagnosis not present

## 2017-02-18 DIAGNOSIS — D509 Iron deficiency anemia, unspecified: Secondary | ICD-10-CM | POA: Diagnosis not present

## 2017-02-18 DIAGNOSIS — N186 End stage renal disease: Secondary | ICD-10-CM | POA: Diagnosis not present

## 2017-02-18 DIAGNOSIS — N2581 Secondary hyperparathyroidism of renal origin: Secondary | ICD-10-CM | POA: Diagnosis not present

## 2017-02-18 DIAGNOSIS — Z992 Dependence on renal dialysis: Secondary | ICD-10-CM | POA: Diagnosis not present

## 2017-02-20 DIAGNOSIS — Z992 Dependence on renal dialysis: Secondary | ICD-10-CM | POA: Diagnosis not present

## 2017-02-20 DIAGNOSIS — N2581 Secondary hyperparathyroidism of renal origin: Secondary | ICD-10-CM | POA: Diagnosis not present

## 2017-02-20 DIAGNOSIS — D509 Iron deficiency anemia, unspecified: Secondary | ICD-10-CM | POA: Diagnosis not present

## 2017-02-20 DIAGNOSIS — N186 End stage renal disease: Secondary | ICD-10-CM | POA: Diagnosis not present

## 2017-02-24 DIAGNOSIS — D509 Iron deficiency anemia, unspecified: Secondary | ICD-10-CM | POA: Diagnosis not present

## 2017-02-24 DIAGNOSIS — N186 End stage renal disease: Secondary | ICD-10-CM | POA: Diagnosis not present

## 2017-02-24 DIAGNOSIS — Z992 Dependence on renal dialysis: Secondary | ICD-10-CM | POA: Diagnosis not present

## 2017-02-24 DIAGNOSIS — N2581 Secondary hyperparathyroidism of renal origin: Secondary | ICD-10-CM | POA: Diagnosis not present

## 2017-02-25 DIAGNOSIS — N2581 Secondary hyperparathyroidism of renal origin: Secondary | ICD-10-CM | POA: Diagnosis not present

## 2017-02-25 DIAGNOSIS — Z992 Dependence on renal dialysis: Secondary | ICD-10-CM | POA: Diagnosis not present

## 2017-02-25 DIAGNOSIS — N186 End stage renal disease: Secondary | ICD-10-CM | POA: Diagnosis not present

## 2017-02-25 DIAGNOSIS — D509 Iron deficiency anemia, unspecified: Secondary | ICD-10-CM | POA: Diagnosis not present

## 2017-02-27 DIAGNOSIS — D509 Iron deficiency anemia, unspecified: Secondary | ICD-10-CM | POA: Diagnosis not present

## 2017-02-27 DIAGNOSIS — N186 End stage renal disease: Secondary | ICD-10-CM | POA: Diagnosis not present

## 2017-02-27 DIAGNOSIS — N2581 Secondary hyperparathyroidism of renal origin: Secondary | ICD-10-CM | POA: Diagnosis not present

## 2017-02-27 DIAGNOSIS — Z992 Dependence on renal dialysis: Secondary | ICD-10-CM | POA: Diagnosis not present

## 2017-03-02 DIAGNOSIS — T82868A Thrombosis of vascular prosthetic devices, implants and grafts, initial encounter: Secondary | ICD-10-CM | POA: Diagnosis not present

## 2017-03-02 DIAGNOSIS — N186 End stage renal disease: Secondary | ICD-10-CM | POA: Diagnosis not present

## 2017-03-02 DIAGNOSIS — Z992 Dependence on renal dialysis: Secondary | ICD-10-CM | POA: Diagnosis not present

## 2017-03-02 DIAGNOSIS — I871 Compression of vein: Secondary | ICD-10-CM | POA: Diagnosis not present

## 2017-03-03 DIAGNOSIS — D509 Iron deficiency anemia, unspecified: Secondary | ICD-10-CM | POA: Diagnosis not present

## 2017-03-03 DIAGNOSIS — N186 End stage renal disease: Secondary | ICD-10-CM | POA: Diagnosis not present

## 2017-03-03 DIAGNOSIS — N2581 Secondary hyperparathyroidism of renal origin: Secondary | ICD-10-CM | POA: Diagnosis not present

## 2017-03-03 DIAGNOSIS — Z992 Dependence on renal dialysis: Secondary | ICD-10-CM | POA: Diagnosis not present

## 2017-03-04 DIAGNOSIS — N2581 Secondary hyperparathyroidism of renal origin: Secondary | ICD-10-CM | POA: Diagnosis not present

## 2017-03-04 DIAGNOSIS — D509 Iron deficiency anemia, unspecified: Secondary | ICD-10-CM | POA: Diagnosis not present

## 2017-03-04 DIAGNOSIS — N186 End stage renal disease: Secondary | ICD-10-CM | POA: Diagnosis not present

## 2017-03-04 DIAGNOSIS — Z992 Dependence on renal dialysis: Secondary | ICD-10-CM | POA: Diagnosis not present

## 2017-03-05 DIAGNOSIS — M329 Systemic lupus erythematosus, unspecified: Secondary | ICD-10-CM | POA: Diagnosis not present

## 2017-03-05 DIAGNOSIS — I120 Hypertensive chronic kidney disease with stage 5 chronic kidney disease or end stage renal disease: Secondary | ICD-10-CM | POA: Diagnosis not present

## 2017-03-05 DIAGNOSIS — J449 Chronic obstructive pulmonary disease, unspecified: Secondary | ICD-10-CM | POA: Diagnosis not present

## 2017-03-05 DIAGNOSIS — R569 Unspecified convulsions: Secondary | ICD-10-CM | POA: Diagnosis not present

## 2017-03-06 DIAGNOSIS — D509 Iron deficiency anemia, unspecified: Secondary | ICD-10-CM | POA: Diagnosis not present

## 2017-03-06 DIAGNOSIS — N186 End stage renal disease: Secondary | ICD-10-CM | POA: Diagnosis not present

## 2017-03-06 DIAGNOSIS — N2581 Secondary hyperparathyroidism of renal origin: Secondary | ICD-10-CM | POA: Diagnosis not present

## 2017-03-06 DIAGNOSIS — Z992 Dependence on renal dialysis: Secondary | ICD-10-CM | POA: Diagnosis not present

## 2017-03-08 DIAGNOSIS — Z992 Dependence on renal dialysis: Secondary | ICD-10-CM | POA: Diagnosis not present

## 2017-03-08 DIAGNOSIS — D509 Iron deficiency anemia, unspecified: Secondary | ICD-10-CM | POA: Diagnosis not present

## 2017-03-08 DIAGNOSIS — N186 End stage renal disease: Secondary | ICD-10-CM | POA: Diagnosis not present

## 2017-03-08 DIAGNOSIS — N2581 Secondary hyperparathyroidism of renal origin: Secondary | ICD-10-CM | POA: Diagnosis not present

## 2017-03-11 DIAGNOSIS — D509 Iron deficiency anemia, unspecified: Secondary | ICD-10-CM | POA: Diagnosis not present

## 2017-03-11 DIAGNOSIS — N2581 Secondary hyperparathyroidism of renal origin: Secondary | ICD-10-CM | POA: Diagnosis not present

## 2017-03-11 DIAGNOSIS — N186 End stage renal disease: Secondary | ICD-10-CM | POA: Diagnosis not present

## 2017-03-11 DIAGNOSIS — Z992 Dependence on renal dialysis: Secondary | ICD-10-CM | POA: Diagnosis not present

## 2017-03-13 DIAGNOSIS — D509 Iron deficiency anemia, unspecified: Secondary | ICD-10-CM | POA: Diagnosis not present

## 2017-03-13 DIAGNOSIS — N186 End stage renal disease: Secondary | ICD-10-CM | POA: Diagnosis not present

## 2017-03-13 DIAGNOSIS — Z992 Dependence on renal dialysis: Secondary | ICD-10-CM | POA: Diagnosis not present

## 2017-03-13 DIAGNOSIS — N2581 Secondary hyperparathyroidism of renal origin: Secondary | ICD-10-CM | POA: Diagnosis not present

## 2017-03-16 DIAGNOSIS — D509 Iron deficiency anemia, unspecified: Secondary | ICD-10-CM | POA: Diagnosis not present

## 2017-03-16 DIAGNOSIS — Z992 Dependence on renal dialysis: Secondary | ICD-10-CM | POA: Diagnosis not present

## 2017-03-16 DIAGNOSIS — N186 End stage renal disease: Secondary | ICD-10-CM | POA: Diagnosis not present

## 2017-03-16 DIAGNOSIS — N2581 Secondary hyperparathyroidism of renal origin: Secondary | ICD-10-CM | POA: Diagnosis not present

## 2017-03-18 DIAGNOSIS — D631 Anemia in chronic kidney disease: Secondary | ICD-10-CM | POA: Diagnosis not present

## 2017-03-18 DIAGNOSIS — N2581 Secondary hyperparathyroidism of renal origin: Secondary | ICD-10-CM | POA: Diagnosis not present

## 2017-03-18 DIAGNOSIS — N186 End stage renal disease: Secondary | ICD-10-CM | POA: Diagnosis not present

## 2017-03-18 DIAGNOSIS — Z992 Dependence on renal dialysis: Secondary | ICD-10-CM | POA: Diagnosis not present

## 2017-03-18 DIAGNOSIS — D509 Iron deficiency anemia, unspecified: Secondary | ICD-10-CM | POA: Diagnosis not present

## 2017-03-20 DIAGNOSIS — N186 End stage renal disease: Secondary | ICD-10-CM | POA: Diagnosis not present

## 2017-03-20 DIAGNOSIS — D631 Anemia in chronic kidney disease: Secondary | ICD-10-CM | POA: Diagnosis not present

## 2017-03-20 DIAGNOSIS — N2581 Secondary hyperparathyroidism of renal origin: Secondary | ICD-10-CM | POA: Diagnosis not present

## 2017-03-20 DIAGNOSIS — D509 Iron deficiency anemia, unspecified: Secondary | ICD-10-CM | POA: Diagnosis not present

## 2017-03-20 DIAGNOSIS — Z992 Dependence on renal dialysis: Secondary | ICD-10-CM | POA: Diagnosis not present

## 2017-03-23 DIAGNOSIS — N186 End stage renal disease: Secondary | ICD-10-CM | POA: Diagnosis not present

## 2017-03-23 DIAGNOSIS — Z992 Dependence on renal dialysis: Secondary | ICD-10-CM | POA: Diagnosis not present

## 2017-03-23 DIAGNOSIS — D509 Iron deficiency anemia, unspecified: Secondary | ICD-10-CM | POA: Diagnosis not present

## 2017-03-23 DIAGNOSIS — N2581 Secondary hyperparathyroidism of renal origin: Secondary | ICD-10-CM | POA: Diagnosis not present

## 2017-03-23 DIAGNOSIS — D631 Anemia in chronic kidney disease: Secondary | ICD-10-CM | POA: Diagnosis not present

## 2017-03-25 DIAGNOSIS — D509 Iron deficiency anemia, unspecified: Secondary | ICD-10-CM | POA: Diagnosis not present

## 2017-03-25 DIAGNOSIS — N186 End stage renal disease: Secondary | ICD-10-CM | POA: Diagnosis not present

## 2017-03-25 DIAGNOSIS — N2581 Secondary hyperparathyroidism of renal origin: Secondary | ICD-10-CM | POA: Diagnosis not present

## 2017-03-25 DIAGNOSIS — Z992 Dependence on renal dialysis: Secondary | ICD-10-CM | POA: Diagnosis not present

## 2017-03-25 DIAGNOSIS — D631 Anemia in chronic kidney disease: Secondary | ICD-10-CM | POA: Diagnosis not present

## 2017-03-27 DIAGNOSIS — D509 Iron deficiency anemia, unspecified: Secondary | ICD-10-CM | POA: Diagnosis not present

## 2017-03-27 DIAGNOSIS — N2581 Secondary hyperparathyroidism of renal origin: Secondary | ICD-10-CM | POA: Diagnosis not present

## 2017-03-27 DIAGNOSIS — Z992 Dependence on renal dialysis: Secondary | ICD-10-CM | POA: Diagnosis not present

## 2017-03-27 DIAGNOSIS — N186 End stage renal disease: Secondary | ICD-10-CM | POA: Diagnosis not present

## 2017-03-27 DIAGNOSIS — D631 Anemia in chronic kidney disease: Secondary | ICD-10-CM | POA: Diagnosis not present

## 2017-03-30 DIAGNOSIS — N2581 Secondary hyperparathyroidism of renal origin: Secondary | ICD-10-CM | POA: Diagnosis not present

## 2017-03-30 DIAGNOSIS — N186 End stage renal disease: Secondary | ICD-10-CM | POA: Diagnosis not present

## 2017-03-30 DIAGNOSIS — Z992 Dependence on renal dialysis: Secondary | ICD-10-CM | POA: Diagnosis not present

## 2017-03-30 DIAGNOSIS — E119 Type 2 diabetes mellitus without complications: Secondary | ICD-10-CM | POA: Diagnosis not present

## 2017-03-30 DIAGNOSIS — D631 Anemia in chronic kidney disease: Secondary | ICD-10-CM | POA: Diagnosis not present

## 2017-03-30 DIAGNOSIS — D509 Iron deficiency anemia, unspecified: Secondary | ICD-10-CM | POA: Diagnosis not present

## 2017-04-01 DIAGNOSIS — N2581 Secondary hyperparathyroidism of renal origin: Secondary | ICD-10-CM | POA: Diagnosis not present

## 2017-04-01 DIAGNOSIS — D631 Anemia in chronic kidney disease: Secondary | ICD-10-CM | POA: Diagnosis not present

## 2017-04-01 DIAGNOSIS — Z992 Dependence on renal dialysis: Secondary | ICD-10-CM | POA: Diagnosis not present

## 2017-04-01 DIAGNOSIS — D509 Iron deficiency anemia, unspecified: Secondary | ICD-10-CM | POA: Diagnosis not present

## 2017-04-01 DIAGNOSIS — N186 End stage renal disease: Secondary | ICD-10-CM | POA: Diagnosis not present

## 2017-04-03 DIAGNOSIS — Z992 Dependence on renal dialysis: Secondary | ICD-10-CM | POA: Diagnosis not present

## 2017-04-03 DIAGNOSIS — N2581 Secondary hyperparathyroidism of renal origin: Secondary | ICD-10-CM | POA: Diagnosis not present

## 2017-04-03 DIAGNOSIS — D631 Anemia in chronic kidney disease: Secondary | ICD-10-CM | POA: Diagnosis not present

## 2017-04-03 DIAGNOSIS — D509 Iron deficiency anemia, unspecified: Secondary | ICD-10-CM | POA: Diagnosis not present

## 2017-04-03 DIAGNOSIS — N186 End stage renal disease: Secondary | ICD-10-CM | POA: Diagnosis not present

## 2017-04-06 DIAGNOSIS — N2581 Secondary hyperparathyroidism of renal origin: Secondary | ICD-10-CM | POA: Diagnosis not present

## 2017-04-06 DIAGNOSIS — Z992 Dependence on renal dialysis: Secondary | ICD-10-CM | POA: Diagnosis not present

## 2017-04-06 DIAGNOSIS — N186 End stage renal disease: Secondary | ICD-10-CM | POA: Diagnosis not present

## 2017-04-06 DIAGNOSIS — D509 Iron deficiency anemia, unspecified: Secondary | ICD-10-CM | POA: Diagnosis not present

## 2017-04-06 DIAGNOSIS — D631 Anemia in chronic kidney disease: Secondary | ICD-10-CM | POA: Diagnosis not present

## 2017-04-08 DIAGNOSIS — Z992 Dependence on renal dialysis: Secondary | ICD-10-CM | POA: Diagnosis not present

## 2017-04-08 DIAGNOSIS — N2581 Secondary hyperparathyroidism of renal origin: Secondary | ICD-10-CM | POA: Diagnosis not present

## 2017-04-08 DIAGNOSIS — D509 Iron deficiency anemia, unspecified: Secondary | ICD-10-CM | POA: Diagnosis not present

## 2017-04-08 DIAGNOSIS — D631 Anemia in chronic kidney disease: Secondary | ICD-10-CM | POA: Diagnosis not present

## 2017-04-08 DIAGNOSIS — N186 End stage renal disease: Secondary | ICD-10-CM | POA: Diagnosis not present

## 2017-04-10 DIAGNOSIS — D509 Iron deficiency anemia, unspecified: Secondary | ICD-10-CM | POA: Diagnosis not present

## 2017-04-10 DIAGNOSIS — N2581 Secondary hyperparathyroidism of renal origin: Secondary | ICD-10-CM | POA: Diagnosis not present

## 2017-04-10 DIAGNOSIS — D631 Anemia in chronic kidney disease: Secondary | ICD-10-CM | POA: Diagnosis not present

## 2017-04-10 DIAGNOSIS — N186 End stage renal disease: Secondary | ICD-10-CM | POA: Diagnosis not present

## 2017-04-10 DIAGNOSIS — Z992 Dependence on renal dialysis: Secondary | ICD-10-CM | POA: Diagnosis not present

## 2017-04-13 DIAGNOSIS — Z992 Dependence on renal dialysis: Secondary | ICD-10-CM | POA: Diagnosis not present

## 2017-04-13 DIAGNOSIS — D631 Anemia in chronic kidney disease: Secondary | ICD-10-CM | POA: Diagnosis not present

## 2017-04-13 DIAGNOSIS — D509 Iron deficiency anemia, unspecified: Secondary | ICD-10-CM | POA: Diagnosis not present

## 2017-04-13 DIAGNOSIS — N2581 Secondary hyperparathyroidism of renal origin: Secondary | ICD-10-CM | POA: Diagnosis not present

## 2017-04-13 DIAGNOSIS — N186 End stage renal disease: Secondary | ICD-10-CM | POA: Diagnosis not present

## 2017-04-15 ENCOUNTER — Encounter (HOSPITAL_COMMUNITY): Payer: Self-pay | Admitting: Emergency Medicine

## 2017-04-15 ENCOUNTER — Emergency Department (HOSPITAL_COMMUNITY)
Admission: EM | Admit: 2017-04-15 | Discharge: 2017-04-15 | Disposition: A | Discharge status: Home / Self Care | Payer: Medicare Other | Attending: Emergency Medicine | Admitting: Emergency Medicine

## 2017-04-15 DIAGNOSIS — Z992 Dependence on renal dialysis: Secondary | ICD-10-CM | POA: Insufficient documentation

## 2017-04-15 DIAGNOSIS — D631 Anemia in chronic kidney disease: Secondary | ICD-10-CM | POA: Diagnosis not present

## 2017-04-15 DIAGNOSIS — T82838A Hemorrhage of vascular prosthetic devices, implants and grafts, initial encounter: Secondary | ICD-10-CM

## 2017-04-15 DIAGNOSIS — T82591A Other mechanical complication of surgically created arteriovenous shunt, initial encounter: Secondary | ICD-10-CM | POA: Diagnosis not present

## 2017-04-15 DIAGNOSIS — Z79899 Other long term (current) drug therapy: Secondary | ICD-10-CM | POA: Insufficient documentation

## 2017-04-15 DIAGNOSIS — N186 End stage renal disease: Secondary | ICD-10-CM | POA: Diagnosis not present

## 2017-04-15 DIAGNOSIS — I120 Hypertensive chronic kidney disease with stage 5 chronic kidney disease or end stage renal disease: Secondary | ICD-10-CM | POA: Insufficient documentation

## 2017-04-15 DIAGNOSIS — F17210 Nicotine dependence, cigarettes, uncomplicated: Secondary | ICD-10-CM | POA: Insufficient documentation

## 2017-04-15 DIAGNOSIS — Z86718 Personal history of other venous thrombosis and embolism: Secondary | ICD-10-CM | POA: Diagnosis not present

## 2017-04-15 DIAGNOSIS — N2581 Secondary hyperparathyroidism of renal origin: Secondary | ICD-10-CM | POA: Diagnosis not present

## 2017-04-15 DIAGNOSIS — R58 Hemorrhage, not elsewhere classified: Secondary | ICD-10-CM | POA: Diagnosis not present

## 2017-04-15 DIAGNOSIS — Y829 Unspecified medical devices associated with adverse incidents: Secondary | ICD-10-CM | POA: Diagnosis not present

## 2017-04-15 DIAGNOSIS — D509 Iron deficiency anemia, unspecified: Secondary | ICD-10-CM | POA: Diagnosis not present

## 2017-04-15 LAB — I-STAT CHEM 8, ED
BUN: 9 mg/dL (ref 6–20)
CHLORIDE: 96 mmol/L — AB (ref 101–111)
Calcium, Ion: 1.07 mmol/L — ABNORMAL LOW (ref 1.15–1.40)
Creatinine, Ser: 3.7 mg/dL — ABNORMAL HIGH (ref 0.44–1.00)
Glucose, Bld: 77 mg/dL (ref 65–99)
HEMATOCRIT: 40 % (ref 36.0–46.0)
HEMOGLOBIN: 13.6 g/dL (ref 12.0–15.0)
POTASSIUM: 4 mmol/L (ref 3.5–5.1)
Sodium: 140 mmol/L (ref 135–145)
TCO2: 32 mmol/L (ref 22–32)

## 2017-04-15 LAB — I-STAT CG4 LACTIC ACID, ED: LACTIC ACID, VENOUS: 0.84 mmol/L (ref 0.5–1.9)

## 2017-04-15 NOTE — ED Notes (Signed)
Hemostatic dressing and 4x4 pressure dressing applied to left arm fistula

## 2017-04-15 NOTE — ED Triage Notes (Addendum)
Pt reports bleeding of LT upper arm dialysis graft that began after dialysis today. Bleeding controlled with pressure. Pt is from [facility].

## 2017-04-15 NOTE — Discharge Instructions (Signed)
Take your usual prescriptions as previously directed. Keep your dressing in place until you are seen in follow up.  Call your regular Renal doctor tomorrow to schedule a follow up appointment within the next 24 to 48 hours for a re-check. Return to the Emergency Department immediately sooner if worsening.

## 2017-04-15 NOTE — ED Provider Notes (Signed)
[facility] EMERGENCY DEPARTMENT Provider Note   CSN: [id number] Arrival date & time: 04/15/17  1704     History   Chief Complaint Chief Complaint  Patient presents with  . Bleeding Dialysis Graft    HPI [name] is a 42 y.o. female.  The history is provided by the patient. The history is limited by the condition of the patient (Hx MR).    Pt was seen at 1740. Per pt, c/o gradual onset and persistence of constant LUE HD graft "bleeding" that began sometime after HD today. Pt states she "doesn't know" when the bleeding started, "maybe this afternoon." Pt applied pressure with "some" improvement. Denies any other symptoms. Denies anticoagulant use. Denies CP/SOB, no injury, no rash.   Past Medical History:  Diagnosis Date  . Anemia   . BV (bacterial vaginosis) 11/08/2013  . Depression   . Dialysis patient ([place])   . DVT (deep venous thrombosis) ([place])   . Hypertension   . Lupus   . Renal disorder   . Vaginal odor 11/08/2013    Patient Active Problem List   Diagnosis Date Noted  . Hyperkalemia 10/23/2016  . Complication of vascular access for dialysis 04/01/2016  . ESRD on dialysis ([place]) 04/01/2016  . Vaginal discharge 11/08/2013  . Vaginal odor 11/08/2013  . BV (bacterial vaginosis) 11/08/2013  . SWELLING, NECK 04/02/2007  . ASTHMA 03/15/2007  . FOOT PAIN, RIGHT 03/15/2007  . COUGH 03/15/2007  . MENTAL RETARDATION, MODERATE 01/19/2007  . STATUS, MENTAL, ALTERED 07/16/2006  . ANOREXIA 05/26/2006  . HYPOTENSION NOS 05/15/2006  . TACHYCARDIA 05/15/2006  . Lupus 04/01/2006  . ANEMIA-NOS 03/31/2006  . LEUKOCYTOSIS 03/31/2006  . DEPRESSION 03/31/2006  . MIGRAINE HEADACHE 03/31/2006  . PERIPHERAL NEUROPATHY 03/31/2006  . Essential hypertension 03/31/2006  . GERD 03/31/2006  . OSTEOARTHRITIS 03/31/2006  . HYPERGLYCEMIA 03/31/2006    Past Surgical History:  Procedure Laterality Date  . A/V SHUNT INTERVENTION N/A 10/24/2016   Procedure: A/V SHUNT  INTERVENTION;  Surgeon: Katha Cabal, MD;  Location: [place] [facility];  Service: Cardiovascular;  Laterality: N/A;  . AV FISTULA PLACEMENT    . DIALYSIS/PERMA CATHETER INSERTION  10/23/2016   Procedure: DIALYSIS/PERMA CATHETER INSERTION;  Surgeon: Algernon Huxley, MD;  Location: [place] [facility];  Service: Cardiovascular;;  . PERIPHERAL VASCULAR CATHETERIZATION Left 01/04/2015   Procedure: A/V Shuntogram/Fistulagram;  Surgeon: Algernon Huxley, MD;  Location: [place] [facility];  Service: Cardiovascular;  Laterality: Left;  . PERIPHERAL VASCULAR CATHETERIZATION N/A 01/04/2015   Procedure: A/V Shunt Intervention;  Surgeon: Algernon Huxley, MD;  Location: [place] [facility];  Service: Cardiovascular;  Laterality: N/A;  . PERIPHERAL VASCULAR CATHETERIZATION Left 04/13/2015   Procedure: A/V Shuntogram/Fistulagram;  Surgeon: Katha Cabal, MD;  Location: [place] [facility];  Service: Cardiovascular;  Laterality: Left;  . PERIPHERAL VASCULAR CATHETERIZATION Left 04/13/2015   Procedure: A/V Shunt Intervention;  Surgeon: Katha Cabal, MD;  Location: [place] [facility];  Service: Cardiovascular;  Laterality: Left;    OB History    Gravida Para Term Preterm AB Living   3 3 3     2    SAB TAB Ectopic Multiple Live Births           2       Home Medications    Prior to Admission medications   Medication Sig Start Date End Date Taking? Authorizing Provider  albuterol (PROVENTIL) (2.5 MG/3ML) 0.083% nebulizer solution Take 2.5 mg by nebulization 4 (four) times daily  as needed (breathing).    [provider]  ALPRAZolam Duanne Moron) 0.25 MG tablet Take 0.25 mg by mouth 2 (two) times daily as needed for anxiety.     [provider]  aspirin EC 81 MG tablet Take 81 mg by mouth daily.    [provider]  chlorhexidine (PERIDEX) 0.12 % solution Use as directed 10 mLs in the mouth or throat 2 (two) times daily.    [provider]  clopidogrel (PLAVIX) 75 MG  tablet Take 75 mg by mouth daily.    [provider]  docusate sodium (COLACE) 100 MG capsule Take 100 mg by mouth daily.    [provider]  doxercalciferol (HECTOROL) 4 MCG/2ML injection Inject into the vein.    [provider]  gabapentin (NEURONTIN) 100 MG capsule Take 100 mg by mouth.  05/22/10   [provider]  guaiFENesin (ROBITUSSIN) 100 MG/5ML liquid Take 200 mg by mouth 3 (three) times daily as needed for cough.    [provider]  HYDROcodone-acetaminophen (NORCO/VICODIN) 5-325 MG per tablet Take 2 tablets by mouth every 4 (four) hours as needed. 06/17/13   Tanna Furry, MD  hydroxychloroquine (PLAQUENIL) 200 MG tablet Take 200 mg by mouth daily.    [provider]  ipratropium (ATROVENT) 0.02 % nebulizer solution Take 250 mcg by nebulization 4 (four) times daily as needed (breathing).    [provider]  ipratropium-albuterol (DUONEB) 0.5-2.5 (3) MG/3ML SOLN 3 mLs as needed.  03/21/10   [provider]  lidocaine-prilocaine (EMLA) cream Apply 1 application topically as needed (for port access).  12/20/12   [provider]  loratadine (CLARITIN) 10 MG tablet Take 10 mg by mouth daily as needed for allergies.    [provider]  midodrine (PROAMATINE) 10 MG tablet Take 10 mg by mouth 2 (two) times daily. Monday, Wednesday, and Friday before dialysis    [provider]  mirtazapine (REMERON) 15 MG tablet 15 mg at bedtime.  11/03/13   [provider]  multivitamin (RENA-VIT) TABS tablet Take 1 tablet by mouth daily.    [provider]  ondansetron (ZOFRAN) 8 MG tablet Take by mouth as needed for nausea or vomiting.    [provider]  pantoprazole (PROTONIX) 40 MG tablet Take 40 mg by mouth daily.    [provider]  phenytoin (DILANTIN) 100 MG ER capsule Take 200 mg by mouth 2 (two) times daily.     [provider]  polyethylene glycol (MIRALAX / GLYCOLAX)  packet Take 17 g by mouth daily as needed for mild constipation.    [provider]  sertraline (ZOLOFT) 100 MG tablet Take 200 mg by mouth daily.     [provider]  sevelamer carbonate (RENVELA) 800 MG tablet Take 800-1,600 mg by mouth 4 (four) times daily. Takes 1600 mg with each meal and 800 mg with snack    [provider]  simvastatin (ZOCOR) 40 MG tablet Take 40 mg by mouth daily.    [provider]    Family History Family History  Problem Relation Age of Onset  . Seizures Son   . Kidney disease Mother        on dialysis  . Lupus Mother     Social History Social History   Tobacco Use  . Smoking status: Current Every Day Smoker    Packs/day: 0.00    Years: 15.00    Pack years: 0.00    Types: Cigarettes  .  Smokeless tobacco: Never Used  . Tobacco comment: smokes 2 cig daily  Substance Use Topics  . Alcohol use: No  . Drug use: No     Allergies   Dust mite extract   Review of Systems Review of Systems  Unable to perform ROS: Other     Physical Exam Updated Vital Signs BP (!) 120/107 (BP Location: Right Arm)   Pulse 73   Temp 97.9 F (36.6 C) (Oral)   Resp 16   Wt 45.4 kg (100 lb)   LMP 04/09/2017   SpO2 100%   BMI 17.16 kg/m   Physical Exam 1745: Physical examination:  Nursing notes reviewed; Vital signs and O2 SAT reviewed;  Constitutional: Well developed, Well nourished, Well hydrated, In no acute distress; Head:  Normocephalic, atraumatic; Eyes: EOMI, PERRL, No scleral icterus; ENMT: Mouth and pharynx normal, Mucous membranes moist; Neck: Supple, Full range of motion, No lymphadenopathy; Cardiovascular: Regular rate and rhythm, No gallop; Respiratory: Breath sounds clear & equal bilaterally, No wheezes.  Speaking full sentences with ease, Normal respiratory effort/excursion; Chest: Nontender, Movement normal; Abdomen: Soft, Nontender, Nondistended, Normal bowel sounds; Genitourinary: No CVA tenderness; Extremities:  Pulses normal, No tenderness, No edema, +LUE HD graft with one punctate area actively oozing blood.;; Neuro: AA&Ox3, Major CN grossly intact.  Speech clear. No gross focal motor deficits in extremities.; Skin: Color normal, Warm, Dry.   ED Treatments / Results  Labs (all labs ordered are listed, but only abnormal results are displayed)   EKG  EKG Interpretation None       Radiology   Procedures Procedures (including critical care time)  Medications Ordered in ED Medications - No data to display   Initial Impression / Assessment and Plan / ED Course  I have reviewed the triage vital signs and the nursing notes.  Pertinent labs & imaging results that were available during my care of the patient were reviewed by me and considered in my medical decision making (see chart for details).  MDM Reviewed: previous chart, nursing note and vitals Reviewed previous: labs Interpretation: labs   Results for orders placed or performed during the hospital encounter of 04/15/17  I-stat Chem 8, ED  Result Value Ref Range   Sodium 140 135 - 145 mmol/L   Potassium 4.0 3.5 - 5.1 mmol/L   Chloride 96 (L) 101 - 111 mmol/L   BUN 9 6 - 20 mg/dL   Creatinine, Ser 3.70 (H) 0.44 - 1.00 mg/dL   Glucose, Bld 77 65 - 99 mg/dL   Calcium, Ion 1.07 (L) 1.15 - 1.40 mmol/L   TCO2 32 22 - 32 mmol/L   Hemoglobin 13.6 12.0 - 15.0 g/dL   HCT 40.0 36.0 - 46.0 %  I-Stat CG4 Lactic Acid, ED  Result Value Ref Range   Lactic Acid, Venous 0.84 0.5 - 1.9 mmol/L    1750:  Quickclot pad, bulky pressure DSD applied with improvement. Will continue to observe.   1915:  No further bleeding. H/H stable. Pressure dressing changed to bulky DSD without change in good hemostasis. Dx and testing d/w pt.  Questions answered.  Verb understanding, agreeable to d/c home with outpt f/u at HD in 2 days.   Final Clinical Impressions(s) / ED Diagnoses   Final diagnoses:  None    ED Discharge Orders    None          Francine Graven, DO 04/18/17 1109

## 2017-04-16 DIAGNOSIS — N186 End stage renal disease: Secondary | ICD-10-CM | POA: Diagnosis not present

## 2017-04-16 DIAGNOSIS — Z992 Dependence on renal dialysis: Secondary | ICD-10-CM | POA: Diagnosis not present

## 2017-04-17 DIAGNOSIS — D509 Iron deficiency anemia, unspecified: Secondary | ICD-10-CM | POA: Diagnosis not present

## 2017-04-17 DIAGNOSIS — Z992 Dependence on renal dialysis: Secondary | ICD-10-CM | POA: Diagnosis not present

## 2017-04-17 DIAGNOSIS — N186 End stage renal disease: Secondary | ICD-10-CM | POA: Diagnosis not present

## 2017-04-17 DIAGNOSIS — D631 Anemia in chronic kidney disease: Secondary | ICD-10-CM | POA: Diagnosis not present

## 2017-04-17 DIAGNOSIS — N2581 Secondary hyperparathyroidism of renal origin: Secondary | ICD-10-CM | POA: Diagnosis not present

## 2017-04-20 DIAGNOSIS — Z992 Dependence on renal dialysis: Secondary | ICD-10-CM | POA: Diagnosis not present

## 2017-04-20 DIAGNOSIS — N2581 Secondary hyperparathyroidism of renal origin: Secondary | ICD-10-CM | POA: Diagnosis not present

## 2017-04-20 DIAGNOSIS — D631 Anemia in chronic kidney disease: Secondary | ICD-10-CM | POA: Diagnosis not present

## 2017-04-20 DIAGNOSIS — D509 Iron deficiency anemia, unspecified: Secondary | ICD-10-CM | POA: Diagnosis not present

## 2017-04-20 DIAGNOSIS — N186 End stage renal disease: Secondary | ICD-10-CM | POA: Diagnosis not present

## 2017-04-22 DIAGNOSIS — D631 Anemia in chronic kidney disease: Secondary | ICD-10-CM | POA: Diagnosis not present

## 2017-04-22 DIAGNOSIS — N2581 Secondary hyperparathyroidism of renal origin: Secondary | ICD-10-CM | POA: Diagnosis not present

## 2017-04-22 DIAGNOSIS — D509 Iron deficiency anemia, unspecified: Secondary | ICD-10-CM | POA: Diagnosis not present

## 2017-04-22 DIAGNOSIS — Z992 Dependence on renal dialysis: Secondary | ICD-10-CM | POA: Diagnosis not present

## 2017-04-22 DIAGNOSIS — N186 End stage renal disease: Secondary | ICD-10-CM | POA: Diagnosis not present

## 2017-04-24 DIAGNOSIS — D509 Iron deficiency anemia, unspecified: Secondary | ICD-10-CM | POA: Diagnosis not present

## 2017-04-24 DIAGNOSIS — N2581 Secondary hyperparathyroidism of renal origin: Secondary | ICD-10-CM | POA: Diagnosis not present

## 2017-04-24 DIAGNOSIS — D631 Anemia in chronic kidney disease: Secondary | ICD-10-CM | POA: Diagnosis not present

## 2017-04-24 DIAGNOSIS — N186 End stage renal disease: Secondary | ICD-10-CM | POA: Diagnosis not present

## 2017-04-24 DIAGNOSIS — Z992 Dependence on renal dialysis: Secondary | ICD-10-CM | POA: Diagnosis not present

## 2017-04-27 DIAGNOSIS — N2581 Secondary hyperparathyroidism of renal origin: Secondary | ICD-10-CM | POA: Diagnosis not present

## 2017-04-27 DIAGNOSIS — Z992 Dependence on renal dialysis: Secondary | ICD-10-CM | POA: Diagnosis not present

## 2017-04-27 DIAGNOSIS — D631 Anemia in chronic kidney disease: Secondary | ICD-10-CM | POA: Diagnosis not present

## 2017-04-27 DIAGNOSIS — D509 Iron deficiency anemia, unspecified: Secondary | ICD-10-CM | POA: Diagnosis not present

## 2017-04-27 DIAGNOSIS — N186 End stage renal disease: Secondary | ICD-10-CM | POA: Diagnosis not present

## 2017-04-29 DIAGNOSIS — Z992 Dependence on renal dialysis: Secondary | ICD-10-CM | POA: Diagnosis not present

## 2017-04-29 DIAGNOSIS — N186 End stage renal disease: Secondary | ICD-10-CM | POA: Diagnosis not present

## 2017-04-29 DIAGNOSIS — N2581 Secondary hyperparathyroidism of renal origin: Secondary | ICD-10-CM | POA: Diagnosis not present

## 2017-04-29 DIAGNOSIS — D631 Anemia in chronic kidney disease: Secondary | ICD-10-CM | POA: Diagnosis not present

## 2017-04-29 DIAGNOSIS — D509 Iron deficiency anemia, unspecified: Secondary | ICD-10-CM | POA: Diagnosis not present

## 2017-05-01 DIAGNOSIS — D631 Anemia in chronic kidney disease: Secondary | ICD-10-CM | POA: Diagnosis not present

## 2017-05-01 DIAGNOSIS — N186 End stage renal disease: Secondary | ICD-10-CM | POA: Diagnosis not present

## 2017-05-01 DIAGNOSIS — D509 Iron deficiency anemia, unspecified: Secondary | ICD-10-CM | POA: Diagnosis not present

## 2017-05-01 DIAGNOSIS — N2581 Secondary hyperparathyroidism of renal origin: Secondary | ICD-10-CM | POA: Diagnosis not present

## 2017-05-01 DIAGNOSIS — Z992 Dependence on renal dialysis: Secondary | ICD-10-CM | POA: Diagnosis not present

## 2017-05-04 DIAGNOSIS — N2581 Secondary hyperparathyroidism of renal origin: Secondary | ICD-10-CM | POA: Diagnosis not present

## 2017-05-04 DIAGNOSIS — Z992 Dependence on renal dialysis: Secondary | ICD-10-CM | POA: Diagnosis not present

## 2017-05-04 DIAGNOSIS — D509 Iron deficiency anemia, unspecified: Secondary | ICD-10-CM | POA: Diagnosis not present

## 2017-05-04 DIAGNOSIS — D631 Anemia in chronic kidney disease: Secondary | ICD-10-CM | POA: Diagnosis not present

## 2017-05-04 DIAGNOSIS — N186 End stage renal disease: Secondary | ICD-10-CM | POA: Diagnosis not present

## 2017-05-06 DIAGNOSIS — D509 Iron deficiency anemia, unspecified: Secondary | ICD-10-CM | POA: Diagnosis not present

## 2017-05-06 DIAGNOSIS — N186 End stage renal disease: Secondary | ICD-10-CM | POA: Diagnosis not present

## 2017-05-06 DIAGNOSIS — N2581 Secondary hyperparathyroidism of renal origin: Secondary | ICD-10-CM | POA: Diagnosis not present

## 2017-05-06 DIAGNOSIS — Z992 Dependence on renal dialysis: Secondary | ICD-10-CM | POA: Diagnosis not present

## 2017-05-06 DIAGNOSIS — D631 Anemia in chronic kidney disease: Secondary | ICD-10-CM | POA: Diagnosis not present

## 2017-05-08 DIAGNOSIS — N2581 Secondary hyperparathyroidism of renal origin: Secondary | ICD-10-CM | POA: Diagnosis not present

## 2017-05-08 DIAGNOSIS — N186 End stage renal disease: Secondary | ICD-10-CM | POA: Diagnosis not present

## 2017-05-08 DIAGNOSIS — D509 Iron deficiency anemia, unspecified: Secondary | ICD-10-CM | POA: Diagnosis not present

## 2017-05-08 DIAGNOSIS — Z992 Dependence on renal dialysis: Secondary | ICD-10-CM | POA: Diagnosis not present

## 2017-05-08 DIAGNOSIS — D631 Anemia in chronic kidney disease: Secondary | ICD-10-CM | POA: Diagnosis not present

## 2017-05-11 DIAGNOSIS — D509 Iron deficiency anemia, unspecified: Secondary | ICD-10-CM | POA: Diagnosis not present

## 2017-05-11 DIAGNOSIS — N186 End stage renal disease: Secondary | ICD-10-CM | POA: Diagnosis not present

## 2017-05-11 DIAGNOSIS — D631 Anemia in chronic kidney disease: Secondary | ICD-10-CM | POA: Diagnosis not present

## 2017-05-11 DIAGNOSIS — Z992 Dependence on renal dialysis: Secondary | ICD-10-CM | POA: Diagnosis not present

## 2017-05-11 DIAGNOSIS — N2581 Secondary hyperparathyroidism of renal origin: Secondary | ICD-10-CM | POA: Diagnosis not present

## 2017-05-13 DIAGNOSIS — D631 Anemia in chronic kidney disease: Secondary | ICD-10-CM | POA: Diagnosis not present

## 2017-05-13 DIAGNOSIS — N2581 Secondary hyperparathyroidism of renal origin: Secondary | ICD-10-CM | POA: Diagnosis not present

## 2017-05-13 DIAGNOSIS — D509 Iron deficiency anemia, unspecified: Secondary | ICD-10-CM | POA: Diagnosis not present

## 2017-05-13 DIAGNOSIS — N186 End stage renal disease: Secondary | ICD-10-CM | POA: Diagnosis not present

## 2017-05-13 DIAGNOSIS — Z992 Dependence on renal dialysis: Secondary | ICD-10-CM | POA: Diagnosis not present

## 2017-05-14 DIAGNOSIS — Z992 Dependence on renal dialysis: Secondary | ICD-10-CM | POA: Diagnosis not present

## 2017-05-14 DIAGNOSIS — N186 End stage renal disease: Secondary | ICD-10-CM | POA: Diagnosis not present

## 2017-05-15 DIAGNOSIS — D509 Iron deficiency anemia, unspecified: Secondary | ICD-10-CM | POA: Diagnosis not present

## 2017-05-15 DIAGNOSIS — N2581 Secondary hyperparathyroidism of renal origin: Secondary | ICD-10-CM | POA: Diagnosis not present

## 2017-05-15 DIAGNOSIS — N186 End stage renal disease: Secondary | ICD-10-CM | POA: Diagnosis not present

## 2017-05-15 DIAGNOSIS — Z992 Dependence on renal dialysis: Secondary | ICD-10-CM | POA: Diagnosis not present

## 2017-05-18 DIAGNOSIS — D509 Iron deficiency anemia, unspecified: Secondary | ICD-10-CM | POA: Diagnosis not present

## 2017-05-18 DIAGNOSIS — N186 End stage renal disease: Secondary | ICD-10-CM | POA: Diagnosis not present

## 2017-05-18 DIAGNOSIS — Z992 Dependence on renal dialysis: Secondary | ICD-10-CM | POA: Diagnosis not present

## 2017-05-18 DIAGNOSIS — N2581 Secondary hyperparathyroidism of renal origin: Secondary | ICD-10-CM | POA: Diagnosis not present

## 2017-05-20 DIAGNOSIS — N2581 Secondary hyperparathyroidism of renal origin: Secondary | ICD-10-CM | POA: Diagnosis not present

## 2017-05-20 DIAGNOSIS — N186 End stage renal disease: Secondary | ICD-10-CM | POA: Diagnosis not present

## 2017-05-20 DIAGNOSIS — Z992 Dependence on renal dialysis: Secondary | ICD-10-CM | POA: Diagnosis not present

## 2017-05-20 DIAGNOSIS — D509 Iron deficiency anemia, unspecified: Secondary | ICD-10-CM | POA: Diagnosis not present

## 2017-05-22 DIAGNOSIS — D509 Iron deficiency anemia, unspecified: Secondary | ICD-10-CM | POA: Diagnosis not present

## 2017-05-22 DIAGNOSIS — N186 End stage renal disease: Secondary | ICD-10-CM | POA: Diagnosis not present

## 2017-05-22 DIAGNOSIS — N2581 Secondary hyperparathyroidism of renal origin: Secondary | ICD-10-CM | POA: Diagnosis not present

## 2017-05-22 DIAGNOSIS — Z992 Dependence on renal dialysis: Secondary | ICD-10-CM | POA: Diagnosis not present

## 2017-05-25 DIAGNOSIS — D509 Iron deficiency anemia, unspecified: Secondary | ICD-10-CM | POA: Diagnosis not present

## 2017-05-25 DIAGNOSIS — N2581 Secondary hyperparathyroidism of renal origin: Secondary | ICD-10-CM | POA: Diagnosis not present

## 2017-05-25 DIAGNOSIS — Z992 Dependence on renal dialysis: Secondary | ICD-10-CM | POA: Diagnosis not present

## 2017-05-25 DIAGNOSIS — N186 End stage renal disease: Secondary | ICD-10-CM | POA: Diagnosis not present

## 2017-05-27 DIAGNOSIS — Z992 Dependence on renal dialysis: Secondary | ICD-10-CM | POA: Diagnosis not present

## 2017-05-27 DIAGNOSIS — N2581 Secondary hyperparathyroidism of renal origin: Secondary | ICD-10-CM | POA: Diagnosis not present

## 2017-05-27 DIAGNOSIS — D509 Iron deficiency anemia, unspecified: Secondary | ICD-10-CM | POA: Diagnosis not present

## 2017-05-27 DIAGNOSIS — N186 End stage renal disease: Secondary | ICD-10-CM | POA: Diagnosis not present

## 2017-05-29 DIAGNOSIS — N2581 Secondary hyperparathyroidism of renal origin: Secondary | ICD-10-CM | POA: Diagnosis not present

## 2017-05-29 DIAGNOSIS — N186 End stage renal disease: Secondary | ICD-10-CM | POA: Diagnosis not present

## 2017-05-29 DIAGNOSIS — D509 Iron deficiency anemia, unspecified: Secondary | ICD-10-CM | POA: Diagnosis not present

## 2017-05-29 DIAGNOSIS — Z992 Dependence on renal dialysis: Secondary | ICD-10-CM | POA: Diagnosis not present

## 2017-06-01 DIAGNOSIS — N186 End stage renal disease: Secondary | ICD-10-CM | POA: Diagnosis not present

## 2017-06-01 DIAGNOSIS — N2581 Secondary hyperparathyroidism of renal origin: Secondary | ICD-10-CM | POA: Diagnosis not present

## 2017-06-01 DIAGNOSIS — Z992 Dependence on renal dialysis: Secondary | ICD-10-CM | POA: Diagnosis not present

## 2017-06-01 DIAGNOSIS — D509 Iron deficiency anemia, unspecified: Secondary | ICD-10-CM | POA: Diagnosis not present

## 2017-06-03 DIAGNOSIS — D509 Iron deficiency anemia, unspecified: Secondary | ICD-10-CM | POA: Diagnosis not present

## 2017-06-03 DIAGNOSIS — N186 End stage renal disease: Secondary | ICD-10-CM | POA: Diagnosis not present

## 2017-06-03 DIAGNOSIS — N2581 Secondary hyperparathyroidism of renal origin: Secondary | ICD-10-CM | POA: Diagnosis not present

## 2017-06-03 DIAGNOSIS — Z992 Dependence on renal dialysis: Secondary | ICD-10-CM | POA: Diagnosis not present

## 2017-06-05 DIAGNOSIS — D509 Iron deficiency anemia, unspecified: Secondary | ICD-10-CM | POA: Diagnosis not present

## 2017-06-05 DIAGNOSIS — Z992 Dependence on renal dialysis: Secondary | ICD-10-CM | POA: Diagnosis not present

## 2017-06-05 DIAGNOSIS — N2581 Secondary hyperparathyroidism of renal origin: Secondary | ICD-10-CM | POA: Diagnosis not present

## 2017-06-05 DIAGNOSIS — N186 End stage renal disease: Secondary | ICD-10-CM | POA: Diagnosis not present

## 2017-06-08 DIAGNOSIS — Z992 Dependence on renal dialysis: Secondary | ICD-10-CM | POA: Diagnosis not present

## 2017-06-08 DIAGNOSIS — D509 Iron deficiency anemia, unspecified: Secondary | ICD-10-CM | POA: Diagnosis not present

## 2017-06-08 DIAGNOSIS — N186 End stage renal disease: Secondary | ICD-10-CM | POA: Diagnosis not present

## 2017-06-08 DIAGNOSIS — N2581 Secondary hyperparathyroidism of renal origin: Secondary | ICD-10-CM | POA: Diagnosis not present

## 2017-06-10 DIAGNOSIS — N186 End stage renal disease: Secondary | ICD-10-CM | POA: Diagnosis not present

## 2017-06-10 DIAGNOSIS — Z992 Dependence on renal dialysis: Secondary | ICD-10-CM | POA: Diagnosis not present

## 2017-06-10 DIAGNOSIS — D509 Iron deficiency anemia, unspecified: Secondary | ICD-10-CM | POA: Diagnosis not present

## 2017-06-10 DIAGNOSIS — N2581 Secondary hyperparathyroidism of renal origin: Secondary | ICD-10-CM | POA: Diagnosis not present

## 2017-06-12 DIAGNOSIS — N2581 Secondary hyperparathyroidism of renal origin: Secondary | ICD-10-CM | POA: Diagnosis not present

## 2017-06-12 DIAGNOSIS — N186 End stage renal disease: Secondary | ICD-10-CM | POA: Diagnosis not present

## 2017-06-12 DIAGNOSIS — Z992 Dependence on renal dialysis: Secondary | ICD-10-CM | POA: Diagnosis not present

## 2017-06-12 DIAGNOSIS — D509 Iron deficiency anemia, unspecified: Secondary | ICD-10-CM | POA: Diagnosis not present

## 2017-06-14 DIAGNOSIS — Z992 Dependence on renal dialysis: Secondary | ICD-10-CM | POA: Diagnosis not present

## 2017-06-14 DIAGNOSIS — N186 End stage renal disease: Secondary | ICD-10-CM | POA: Diagnosis not present

## 2017-06-15 DIAGNOSIS — D509 Iron deficiency anemia, unspecified: Secondary | ICD-10-CM | POA: Diagnosis not present

## 2017-06-15 DIAGNOSIS — N186 End stage renal disease: Secondary | ICD-10-CM | POA: Diagnosis not present

## 2017-06-15 DIAGNOSIS — Z992 Dependence on renal dialysis: Secondary | ICD-10-CM | POA: Diagnosis not present

## 2017-06-15 DIAGNOSIS — N2581 Secondary hyperparathyroidism of renal origin: Secondary | ICD-10-CM | POA: Diagnosis not present

## 2017-06-17 DIAGNOSIS — N186 End stage renal disease: Secondary | ICD-10-CM | POA: Diagnosis not present

## 2017-06-17 DIAGNOSIS — D509 Iron deficiency anemia, unspecified: Secondary | ICD-10-CM | POA: Diagnosis not present

## 2017-06-17 DIAGNOSIS — Z992 Dependence on renal dialysis: Secondary | ICD-10-CM | POA: Diagnosis not present

## 2017-06-17 DIAGNOSIS — N2581 Secondary hyperparathyroidism of renal origin: Secondary | ICD-10-CM | POA: Diagnosis not present

## 2017-06-19 DIAGNOSIS — Z992 Dependence on renal dialysis: Secondary | ICD-10-CM | POA: Diagnosis not present

## 2017-06-19 DIAGNOSIS — N186 End stage renal disease: Secondary | ICD-10-CM | POA: Diagnosis not present

## 2017-06-19 DIAGNOSIS — D509 Iron deficiency anemia, unspecified: Secondary | ICD-10-CM | POA: Diagnosis not present

## 2017-06-19 DIAGNOSIS — N2581 Secondary hyperparathyroidism of renal origin: Secondary | ICD-10-CM | POA: Diagnosis not present

## 2017-06-22 DIAGNOSIS — D509 Iron deficiency anemia, unspecified: Secondary | ICD-10-CM | POA: Diagnosis not present

## 2017-06-22 DIAGNOSIS — N186 End stage renal disease: Secondary | ICD-10-CM | POA: Diagnosis not present

## 2017-06-22 DIAGNOSIS — Z992 Dependence on renal dialysis: Secondary | ICD-10-CM | POA: Diagnosis not present

## 2017-06-22 DIAGNOSIS — N2581 Secondary hyperparathyroidism of renal origin: Secondary | ICD-10-CM | POA: Diagnosis not present

## 2017-06-22 DIAGNOSIS — E119 Type 2 diabetes mellitus without complications: Secondary | ICD-10-CM | POA: Diagnosis not present

## 2017-06-24 DIAGNOSIS — N2581 Secondary hyperparathyroidism of renal origin: Secondary | ICD-10-CM | POA: Diagnosis not present

## 2017-06-24 DIAGNOSIS — Z992 Dependence on renal dialysis: Secondary | ICD-10-CM | POA: Diagnosis not present

## 2017-06-24 DIAGNOSIS — D509 Iron deficiency anemia, unspecified: Secondary | ICD-10-CM | POA: Diagnosis not present

## 2017-06-24 DIAGNOSIS — N186 End stage renal disease: Secondary | ICD-10-CM | POA: Diagnosis not present

## 2017-06-26 DIAGNOSIS — N186 End stage renal disease: Secondary | ICD-10-CM | POA: Diagnosis not present

## 2017-06-26 DIAGNOSIS — D509 Iron deficiency anemia, unspecified: Secondary | ICD-10-CM | POA: Diagnosis not present

## 2017-06-26 DIAGNOSIS — N2581 Secondary hyperparathyroidism of renal origin: Secondary | ICD-10-CM | POA: Diagnosis not present

## 2017-06-26 DIAGNOSIS — Z992 Dependence on renal dialysis: Secondary | ICD-10-CM | POA: Diagnosis not present

## 2017-06-29 DIAGNOSIS — N186 End stage renal disease: Secondary | ICD-10-CM | POA: Diagnosis not present

## 2017-06-29 DIAGNOSIS — N2581 Secondary hyperparathyroidism of renal origin: Secondary | ICD-10-CM | POA: Diagnosis not present

## 2017-06-29 DIAGNOSIS — D509 Iron deficiency anemia, unspecified: Secondary | ICD-10-CM | POA: Diagnosis not present

## 2017-06-29 DIAGNOSIS — Z992 Dependence on renal dialysis: Secondary | ICD-10-CM | POA: Diagnosis not present

## 2017-07-01 DIAGNOSIS — D509 Iron deficiency anemia, unspecified: Secondary | ICD-10-CM | POA: Diagnosis not present

## 2017-07-01 DIAGNOSIS — N186 End stage renal disease: Secondary | ICD-10-CM | POA: Diagnosis not present

## 2017-07-01 DIAGNOSIS — N2581 Secondary hyperparathyroidism of renal origin: Secondary | ICD-10-CM | POA: Diagnosis not present

## 2017-07-01 DIAGNOSIS — Z992 Dependence on renal dialysis: Secondary | ICD-10-CM | POA: Diagnosis not present

## 2017-07-03 DIAGNOSIS — N2581 Secondary hyperparathyroidism of renal origin: Secondary | ICD-10-CM | POA: Diagnosis not present

## 2017-07-03 DIAGNOSIS — N186 End stage renal disease: Secondary | ICD-10-CM | POA: Diagnosis not present

## 2017-07-03 DIAGNOSIS — Z992 Dependence on renal dialysis: Secondary | ICD-10-CM | POA: Diagnosis not present

## 2017-07-03 DIAGNOSIS — D509 Iron deficiency anemia, unspecified: Secondary | ICD-10-CM | POA: Diagnosis not present

## 2017-07-06 DIAGNOSIS — N186 End stage renal disease: Secondary | ICD-10-CM | POA: Diagnosis not present

## 2017-07-06 DIAGNOSIS — D509 Iron deficiency anemia, unspecified: Secondary | ICD-10-CM | POA: Diagnosis not present

## 2017-07-06 DIAGNOSIS — Z992 Dependence on renal dialysis: Secondary | ICD-10-CM | POA: Diagnosis not present

## 2017-07-06 DIAGNOSIS — N2581 Secondary hyperparathyroidism of renal origin: Secondary | ICD-10-CM | POA: Diagnosis not present

## 2017-07-08 DIAGNOSIS — N186 End stage renal disease: Secondary | ICD-10-CM | POA: Diagnosis not present

## 2017-07-08 DIAGNOSIS — D509 Iron deficiency anemia, unspecified: Secondary | ICD-10-CM | POA: Diagnosis not present

## 2017-07-08 DIAGNOSIS — Z992 Dependence on renal dialysis: Secondary | ICD-10-CM | POA: Diagnosis not present

## 2017-07-08 DIAGNOSIS — N2581 Secondary hyperparathyroidism of renal origin: Secondary | ICD-10-CM | POA: Diagnosis not present

## 2017-07-10 DIAGNOSIS — N186 End stage renal disease: Secondary | ICD-10-CM | POA: Diagnosis not present

## 2017-07-10 DIAGNOSIS — N2581 Secondary hyperparathyroidism of renal origin: Secondary | ICD-10-CM | POA: Diagnosis not present

## 2017-07-10 DIAGNOSIS — D509 Iron deficiency anemia, unspecified: Secondary | ICD-10-CM | POA: Diagnosis not present

## 2017-07-10 DIAGNOSIS — Z992 Dependence on renal dialysis: Secondary | ICD-10-CM | POA: Diagnosis not present

## 2017-07-13 DIAGNOSIS — N186 End stage renal disease: Secondary | ICD-10-CM | POA: Diagnosis not present

## 2017-07-13 DIAGNOSIS — N2581 Secondary hyperparathyroidism of renal origin: Secondary | ICD-10-CM | POA: Diagnosis not present

## 2017-07-13 DIAGNOSIS — D509 Iron deficiency anemia, unspecified: Secondary | ICD-10-CM | POA: Diagnosis not present

## 2017-07-13 DIAGNOSIS — Z992 Dependence on renal dialysis: Secondary | ICD-10-CM | POA: Diagnosis not present

## 2017-07-14 DIAGNOSIS — Z992 Dependence on renal dialysis: Secondary | ICD-10-CM | POA: Diagnosis not present

## 2017-07-14 DIAGNOSIS — N186 End stage renal disease: Secondary | ICD-10-CM | POA: Diagnosis not present

## 2017-07-15 DIAGNOSIS — D509 Iron deficiency anemia, unspecified: Secondary | ICD-10-CM | POA: Diagnosis not present

## 2017-07-15 DIAGNOSIS — D631 Anemia in chronic kidney disease: Secondary | ICD-10-CM | POA: Diagnosis not present

## 2017-07-15 DIAGNOSIS — N186 End stage renal disease: Secondary | ICD-10-CM | POA: Diagnosis not present

## 2017-07-15 DIAGNOSIS — N2581 Secondary hyperparathyroidism of renal origin: Secondary | ICD-10-CM | POA: Diagnosis not present

## 2017-07-15 DIAGNOSIS — Z992 Dependence on renal dialysis: Secondary | ICD-10-CM | POA: Diagnosis not present

## 2017-07-17 DIAGNOSIS — Z992 Dependence on renal dialysis: Secondary | ICD-10-CM | POA: Diagnosis not present

## 2017-07-17 DIAGNOSIS — D509 Iron deficiency anemia, unspecified: Secondary | ICD-10-CM | POA: Diagnosis not present

## 2017-07-17 DIAGNOSIS — D631 Anemia in chronic kidney disease: Secondary | ICD-10-CM | POA: Diagnosis not present

## 2017-07-17 DIAGNOSIS — N186 End stage renal disease: Secondary | ICD-10-CM | POA: Diagnosis not present

## 2017-07-17 DIAGNOSIS — N2581 Secondary hyperparathyroidism of renal origin: Secondary | ICD-10-CM | POA: Diagnosis not present

## 2017-07-20 DIAGNOSIS — Z992 Dependence on renal dialysis: Secondary | ICD-10-CM | POA: Diagnosis not present

## 2017-07-20 DIAGNOSIS — D631 Anemia in chronic kidney disease: Secondary | ICD-10-CM | POA: Diagnosis not present

## 2017-07-20 DIAGNOSIS — N186 End stage renal disease: Secondary | ICD-10-CM | POA: Diagnosis not present

## 2017-07-20 DIAGNOSIS — D509 Iron deficiency anemia, unspecified: Secondary | ICD-10-CM | POA: Diagnosis not present

## 2017-07-20 DIAGNOSIS — N2581 Secondary hyperparathyroidism of renal origin: Secondary | ICD-10-CM | POA: Diagnosis not present

## 2017-07-22 DIAGNOSIS — D631 Anemia in chronic kidney disease: Secondary | ICD-10-CM | POA: Diagnosis not present

## 2017-07-22 DIAGNOSIS — N2581 Secondary hyperparathyroidism of renal origin: Secondary | ICD-10-CM | POA: Diagnosis not present

## 2017-07-22 DIAGNOSIS — N186 End stage renal disease: Secondary | ICD-10-CM | POA: Diagnosis not present

## 2017-07-22 DIAGNOSIS — D509 Iron deficiency anemia, unspecified: Secondary | ICD-10-CM | POA: Diagnosis not present

## 2017-07-22 DIAGNOSIS — Z992 Dependence on renal dialysis: Secondary | ICD-10-CM | POA: Diagnosis not present

## 2017-07-24 DIAGNOSIS — D509 Iron deficiency anemia, unspecified: Secondary | ICD-10-CM | POA: Diagnosis not present

## 2017-07-24 DIAGNOSIS — N2581 Secondary hyperparathyroidism of renal origin: Secondary | ICD-10-CM | POA: Diagnosis not present

## 2017-07-24 DIAGNOSIS — D631 Anemia in chronic kidney disease: Secondary | ICD-10-CM | POA: Diagnosis not present

## 2017-07-24 DIAGNOSIS — Z992 Dependence on renal dialysis: Secondary | ICD-10-CM | POA: Diagnosis not present

## 2017-07-24 DIAGNOSIS — N186 End stage renal disease: Secondary | ICD-10-CM | POA: Diagnosis not present

## 2017-07-27 DIAGNOSIS — N186 End stage renal disease: Secondary | ICD-10-CM | POA: Diagnosis not present

## 2017-07-27 DIAGNOSIS — Z992 Dependence on renal dialysis: Secondary | ICD-10-CM | POA: Diagnosis not present

## 2017-07-27 DIAGNOSIS — N2581 Secondary hyperparathyroidism of renal origin: Secondary | ICD-10-CM | POA: Diagnosis not present

## 2017-07-27 DIAGNOSIS — D509 Iron deficiency anemia, unspecified: Secondary | ICD-10-CM | POA: Diagnosis not present

## 2017-07-27 DIAGNOSIS — D631 Anemia in chronic kidney disease: Secondary | ICD-10-CM | POA: Diagnosis not present

## 2017-07-29 DIAGNOSIS — N186 End stage renal disease: Secondary | ICD-10-CM | POA: Diagnosis not present

## 2017-07-29 DIAGNOSIS — Z992 Dependence on renal dialysis: Secondary | ICD-10-CM | POA: Diagnosis not present

## 2017-07-29 DIAGNOSIS — D631 Anemia in chronic kidney disease: Secondary | ICD-10-CM | POA: Diagnosis not present

## 2017-07-29 DIAGNOSIS — D509 Iron deficiency anemia, unspecified: Secondary | ICD-10-CM | POA: Diagnosis not present

## 2017-07-29 DIAGNOSIS — N2581 Secondary hyperparathyroidism of renal origin: Secondary | ICD-10-CM | POA: Diagnosis not present

## 2017-07-31 DIAGNOSIS — N2581 Secondary hyperparathyroidism of renal origin: Secondary | ICD-10-CM | POA: Diagnosis not present

## 2017-07-31 DIAGNOSIS — D509 Iron deficiency anemia, unspecified: Secondary | ICD-10-CM | POA: Diagnosis not present

## 2017-07-31 DIAGNOSIS — N186 End stage renal disease: Secondary | ICD-10-CM | POA: Diagnosis not present

## 2017-07-31 DIAGNOSIS — D631 Anemia in chronic kidney disease: Secondary | ICD-10-CM | POA: Diagnosis not present

## 2017-07-31 DIAGNOSIS — Z992 Dependence on renal dialysis: Secondary | ICD-10-CM | POA: Diagnosis not present

## 2017-08-03 DIAGNOSIS — Z992 Dependence on renal dialysis: Secondary | ICD-10-CM | POA: Diagnosis not present

## 2017-08-03 DIAGNOSIS — D631 Anemia in chronic kidney disease: Secondary | ICD-10-CM | POA: Diagnosis not present

## 2017-08-03 DIAGNOSIS — N186 End stage renal disease: Secondary | ICD-10-CM | POA: Diagnosis not present

## 2017-08-03 DIAGNOSIS — N2581 Secondary hyperparathyroidism of renal origin: Secondary | ICD-10-CM | POA: Diagnosis not present

## 2017-08-03 DIAGNOSIS — D509 Iron deficiency anemia, unspecified: Secondary | ICD-10-CM | POA: Diagnosis not present

## 2017-08-05 DIAGNOSIS — N186 End stage renal disease: Secondary | ICD-10-CM | POA: Diagnosis not present

## 2017-08-05 DIAGNOSIS — D631 Anemia in chronic kidney disease: Secondary | ICD-10-CM | POA: Diagnosis not present

## 2017-08-05 DIAGNOSIS — Z992 Dependence on renal dialysis: Secondary | ICD-10-CM | POA: Diagnosis not present

## 2017-08-05 DIAGNOSIS — N2581 Secondary hyperparathyroidism of renal origin: Secondary | ICD-10-CM | POA: Diagnosis not present

## 2017-08-05 DIAGNOSIS — D509 Iron deficiency anemia, unspecified: Secondary | ICD-10-CM | POA: Diagnosis not present

## 2017-08-07 DIAGNOSIS — D509 Iron deficiency anemia, unspecified: Secondary | ICD-10-CM | POA: Diagnosis not present

## 2017-08-07 DIAGNOSIS — N2581 Secondary hyperparathyroidism of renal origin: Secondary | ICD-10-CM | POA: Diagnosis not present

## 2017-08-07 DIAGNOSIS — D631 Anemia in chronic kidney disease: Secondary | ICD-10-CM | POA: Diagnosis not present

## 2017-08-07 DIAGNOSIS — N186 End stage renal disease: Secondary | ICD-10-CM | POA: Diagnosis not present

## 2017-08-07 DIAGNOSIS — Z992 Dependence on renal dialysis: Secondary | ICD-10-CM | POA: Diagnosis not present

## 2017-08-10 DIAGNOSIS — D509 Iron deficiency anemia, unspecified: Secondary | ICD-10-CM | POA: Diagnosis not present

## 2017-08-10 DIAGNOSIS — D631 Anemia in chronic kidney disease: Secondary | ICD-10-CM | POA: Diagnosis not present

## 2017-08-10 DIAGNOSIS — Z992 Dependence on renal dialysis: Secondary | ICD-10-CM | POA: Diagnosis not present

## 2017-08-10 DIAGNOSIS — N186 End stage renal disease: Secondary | ICD-10-CM | POA: Diagnosis not present

## 2017-08-10 DIAGNOSIS — N2581 Secondary hyperparathyroidism of renal origin: Secondary | ICD-10-CM | POA: Diagnosis not present

## 2017-08-13 DIAGNOSIS — Z992 Dependence on renal dialysis: Secondary | ICD-10-CM | POA: Diagnosis not present

## 2017-08-13 DIAGNOSIS — T82868A Thrombosis of vascular prosthetic devices, implants and grafts, initial encounter: Secondary | ICD-10-CM | POA: Diagnosis not present

## 2017-08-13 DIAGNOSIS — N186 End stage renal disease: Secondary | ICD-10-CM | POA: Diagnosis not present

## 2017-08-13 DIAGNOSIS — I871 Compression of vein: Secondary | ICD-10-CM | POA: Diagnosis not present

## 2017-08-14 DIAGNOSIS — D509 Iron deficiency anemia, unspecified: Secondary | ICD-10-CM | POA: Diagnosis not present

## 2017-08-14 DIAGNOSIS — Z992 Dependence on renal dialysis: Secondary | ICD-10-CM | POA: Diagnosis not present

## 2017-08-14 DIAGNOSIS — N2581 Secondary hyperparathyroidism of renal origin: Secondary | ICD-10-CM | POA: Diagnosis not present

## 2017-08-14 DIAGNOSIS — D631 Anemia in chronic kidney disease: Secondary | ICD-10-CM | POA: Diagnosis not present

## 2017-08-14 DIAGNOSIS — N186 End stage renal disease: Secondary | ICD-10-CM | POA: Diagnosis not present

## 2017-08-17 DIAGNOSIS — N2581 Secondary hyperparathyroidism of renal origin: Secondary | ICD-10-CM | POA: Diagnosis not present

## 2017-08-17 DIAGNOSIS — N186 End stage renal disease: Secondary | ICD-10-CM | POA: Diagnosis not present

## 2017-08-17 DIAGNOSIS — D631 Anemia in chronic kidney disease: Secondary | ICD-10-CM | POA: Diagnosis not present

## 2017-08-17 DIAGNOSIS — Z992 Dependence on renal dialysis: Secondary | ICD-10-CM | POA: Diagnosis not present

## 2017-08-17 DIAGNOSIS — D509 Iron deficiency anemia, unspecified: Secondary | ICD-10-CM | POA: Diagnosis not present

## 2017-08-19 DIAGNOSIS — D631 Anemia in chronic kidney disease: Secondary | ICD-10-CM | POA: Diagnosis not present

## 2017-08-19 DIAGNOSIS — N2581 Secondary hyperparathyroidism of renal origin: Secondary | ICD-10-CM | POA: Diagnosis not present

## 2017-08-19 DIAGNOSIS — Z992 Dependence on renal dialysis: Secondary | ICD-10-CM | POA: Diagnosis not present

## 2017-08-19 DIAGNOSIS — N186 End stage renal disease: Secondary | ICD-10-CM | POA: Diagnosis not present

## 2017-08-19 DIAGNOSIS — D509 Iron deficiency anemia, unspecified: Secondary | ICD-10-CM | POA: Diagnosis not present

## 2017-08-21 DIAGNOSIS — D631 Anemia in chronic kidney disease: Secondary | ICD-10-CM | POA: Diagnosis not present

## 2017-08-21 DIAGNOSIS — N2581 Secondary hyperparathyroidism of renal origin: Secondary | ICD-10-CM | POA: Diagnosis not present

## 2017-08-21 DIAGNOSIS — D509 Iron deficiency anemia, unspecified: Secondary | ICD-10-CM | POA: Diagnosis not present

## 2017-08-21 DIAGNOSIS — Z992 Dependence on renal dialysis: Secondary | ICD-10-CM | POA: Diagnosis not present

## 2017-08-21 DIAGNOSIS — N186 End stage renal disease: Secondary | ICD-10-CM | POA: Diagnosis not present

## 2017-08-24 DIAGNOSIS — D631 Anemia in chronic kidney disease: Secondary | ICD-10-CM | POA: Diagnosis not present

## 2017-08-24 DIAGNOSIS — Z992 Dependence on renal dialysis: Secondary | ICD-10-CM | POA: Diagnosis not present

## 2017-08-24 DIAGNOSIS — N186 End stage renal disease: Secondary | ICD-10-CM | POA: Diagnosis not present

## 2017-08-24 DIAGNOSIS — N2581 Secondary hyperparathyroidism of renal origin: Secondary | ICD-10-CM | POA: Diagnosis not present

## 2017-08-24 DIAGNOSIS — D509 Iron deficiency anemia, unspecified: Secondary | ICD-10-CM | POA: Diagnosis not present

## 2017-08-27 ENCOUNTER — Other Ambulatory Visit: Payer: Self-pay

## 2017-08-27 ENCOUNTER — Emergency Department (HOSPITAL_COMMUNITY)
Admission: EM | Admit: 2017-08-27 | Discharge: 2017-08-28 | Disposition: A | Discharge status: Home / Self Care | Payer: Medicare Other | Attending: Emergency Medicine | Admitting: Emergency Medicine

## 2017-08-27 ENCOUNTER — Encounter (HOSPITAL_COMMUNITY): Payer: Self-pay

## 2017-08-27 DIAGNOSIS — F17210 Nicotine dependence, cigarettes, uncomplicated: Secondary | ICD-10-CM | POA: Diagnosis not present

## 2017-08-27 DIAGNOSIS — Z7901 Long term (current) use of anticoagulants: Secondary | ICD-10-CM | POA: Insufficient documentation

## 2017-08-27 DIAGNOSIS — D631 Anemia in chronic kidney disease: Secondary | ICD-10-CM | POA: Diagnosis not present

## 2017-08-27 DIAGNOSIS — T82898A Other specified complication of vascular prosthetic devices, implants and grafts, initial encounter: Secondary | ICD-10-CM | POA: Diagnosis not present

## 2017-08-27 DIAGNOSIS — R4182 Altered mental status, unspecified: Secondary | ICD-10-CM | POA: Insufficient documentation

## 2017-08-27 DIAGNOSIS — T82838A Hemorrhage of vascular prosthetic devices, implants and grafts, initial encounter: Secondary | ICD-10-CM | POA: Insufficient documentation

## 2017-08-27 DIAGNOSIS — T82868A Thrombosis of vascular prosthetic devices, implants and grafts, initial encounter: Secondary | ICD-10-CM | POA: Diagnosis not present

## 2017-08-27 DIAGNOSIS — I120 Hypertensive chronic kidney disease with stage 5 chronic kidney disease or end stage renal disease: Secondary | ICD-10-CM | POA: Diagnosis not present

## 2017-08-27 DIAGNOSIS — Z992 Dependence on renal dialysis: Secondary | ICD-10-CM | POA: Diagnosis not present

## 2017-08-27 DIAGNOSIS — Y69 Unspecified misadventure during surgical and medical care: Secondary | ICD-10-CM | POA: Diagnosis not present

## 2017-08-27 DIAGNOSIS — Z79899 Other long term (current) drug therapy: Secondary | ICD-10-CM | POA: Diagnosis not present

## 2017-08-27 DIAGNOSIS — Z7982 Long term (current) use of aspirin: Secondary | ICD-10-CM | POA: Diagnosis not present

## 2017-08-27 DIAGNOSIS — F71 Moderate intellectual disabilities: Secondary | ICD-10-CM | POA: Diagnosis not present

## 2017-08-27 DIAGNOSIS — N186 End stage renal disease: Secondary | ICD-10-CM | POA: Insufficient documentation

## 2017-08-27 DIAGNOSIS — D509 Iron deficiency anemia, unspecified: Secondary | ICD-10-CM | POA: Diagnosis not present

## 2017-08-27 DIAGNOSIS — T83498A Other mechanical complication of other prosthetic devices, implants and grafts of genital tract, initial encounter: Secondary | ICD-10-CM | POA: Diagnosis not present

## 2017-08-27 DIAGNOSIS — N2581 Secondary hyperparathyroidism of renal origin: Secondary | ICD-10-CM | POA: Diagnosis not present

## 2017-08-27 LAB — CBC WITH DIFFERENTIAL/PLATELET
BASOS PCT: 1 %
Basophils Absolute: 0.1 10*3/uL (ref 0.0–0.1)
EOS PCT: 1 %
Eosinophils Absolute: 0.1 10*3/uL (ref 0.0–0.7)
HEMATOCRIT: 35.5 % — AB (ref 36.0–46.0)
Hemoglobin: 11.9 g/dL — ABNORMAL LOW (ref 12.0–15.0)
Lymphocytes Relative: 23 %
Lymphs Abs: 2.2 10*3/uL (ref 0.7–4.0)
MCH: 29.8 pg (ref 26.0–34.0)
MCHC: 33.5 g/dL (ref 30.0–36.0)
MCV: 88.8 fL (ref 78.0–100.0)
MONO ABS: 0.5 10*3/uL (ref 0.1–1.0)
Monocytes Relative: 6 %
NEUTROS ABS: 6.8 10*3/uL (ref 1.7–7.7)
Neutrophils Relative %: 69 %
PLATELETS: 216 10*3/uL (ref 150–400)
RBC: 4 MIL/uL (ref 3.87–5.11)
RDW: 14.5 % (ref 11.5–15.5)
WBC: 9.8 10*3/uL (ref 4.0–10.5)

## 2017-08-27 LAB — BASIC METABOLIC PANEL
ANION GAP: 11 (ref 5–15)
BUN: 24 mg/dL — ABNORMAL HIGH (ref 6–20)
CALCIUM: 9.5 mg/dL (ref 8.9–10.3)
CO2: 29 mmol/L (ref 22–32)
Chloride: 97 mmol/L — ABNORMAL LOW (ref 101–111)
Creatinine, Ser: 6.25 mg/dL — ABNORMAL HIGH (ref 0.44–1.00)
GFR calc Af Amer: 9 mL/min — ABNORMAL LOW (ref >60)
GFR calc non Af Amer: 7 mL/min — ABNORMAL LOW (ref >60)
GLUCOSE: 83 mg/dL (ref 65–99)
Potassium: 4.9 mmol/L (ref 3.5–5.1)
SODIUM: 137 mmol/L (ref 135–145)

## 2017-08-27 NOTE — ED Triage Notes (Signed)
EMs reports pt had dialysis graft placed in r thigh today.  Reports had dialysis today and when she got home she started bleeding from the new graft.  Pt c/o pain to r thigh.  EMS says area is oozing blood.  EMS placed pressure dressing to r thigh.

## 2017-08-27 NOTE — Discharge Instructions (Addendum)
Call your vascular doctor in the morning to arrange follow-up care and further assessment and treatment as needed.  Remove the bandage tomorrow morning and reapply another bandage with some pressure if needed to help control bleeding.  Return here, if needed, for problems.

## 2017-08-28 DIAGNOSIS — N2581 Secondary hyperparathyroidism of renal origin: Secondary | ICD-10-CM | POA: Diagnosis not present

## 2017-08-28 DIAGNOSIS — D631 Anemia in chronic kidney disease: Secondary | ICD-10-CM | POA: Diagnosis not present

## 2017-08-28 DIAGNOSIS — N186 End stage renal disease: Secondary | ICD-10-CM | POA: Diagnosis not present

## 2017-08-28 DIAGNOSIS — D509 Iron deficiency anemia, unspecified: Secondary | ICD-10-CM | POA: Diagnosis not present

## 2017-08-28 DIAGNOSIS — Z992 Dependence on renal dialysis: Secondary | ICD-10-CM | POA: Diagnosis not present

## 2017-08-28 NOTE — ED Provider Notes (Signed)
[facility] EMERGENCY DEPARTMENT Provider Note   CSN: [id number] Arrival date & time: 08/27/17  1740     History   Chief Complaint Chief Complaint  Patient presents with  . dialysis graft bleeding    HPI [name] is a 42 y.o. female.  HPI   Patient presents for evaluation of bleeding, from a right thigh catheter placed for dialysis today.  She did not dialyze today.  She is unable to give any history.  Level 5 caveat-altered mental status  Past Medical History:  Diagnosis Date  . Anemia   . BV (bacterial vaginosis) 11/08/2013  . Depression   . Dialysis patient ([place])   . DVT (deep venous thrombosis) ([place])   . Hypertension   . Lupus ([place])   . Renal disorder   . Vaginal odor 11/08/2013    Patient Active Problem List   Diagnosis Date Noted  . Hyperkalemia 10/23/2016  . Complication of vascular access for dialysis 04/01/2016  . ESRD on dialysis ([place]) 04/01/2016  . Vaginal discharge 11/08/2013  . Vaginal odor 11/08/2013  . BV (bacterial vaginosis) 11/08/2013  . SWELLING, NECK 04/02/2007  . ASTHMA 03/15/2007  . FOOT PAIN, RIGHT 03/15/2007  . COUGH 03/15/2007  . MENTAL RETARDATION, MODERATE 01/19/2007  . STATUS, MENTAL, ALTERED 07/16/2006  . ANOREXIA 05/26/2006  . HYPOTENSION NOS 05/15/2006  . TACHYCARDIA 05/15/2006  . Lupus ([place]) 04/01/2006  . ANEMIA-NOS 03/31/2006  . LEUKOCYTOSIS 03/31/2006  . DEPRESSION 03/31/2006  . MIGRAINE HEADACHE 03/31/2006  . PERIPHERAL NEUROPATHY 03/31/2006  . Essential hypertension 03/31/2006  . GERD 03/31/2006  . OSTEOARTHRITIS 03/31/2006  . HYPERGLYCEMIA 03/31/2006    Past Surgical History:  Procedure Laterality Date  . A/V SHUNT INTERVENTION N/A 10/24/2016   Procedure: A/V SHUNT INTERVENTION;  Surgeon: Katha Cabal, MD;  Location: [place] [facility];  Service: Cardiovascular;  Laterality: N/A;  . AV FISTULA PLACEMENT    . DIALYSIS/PERMA CATHETER INSERTION  10/23/2016   Procedure: DIALYSIS/PERMA CATHETER  INSERTION;  Surgeon: Algernon Huxley, MD;  Location: [place] [facility];  Service: Cardiovascular;;  . PERIPHERAL VASCULAR CATHETERIZATION Left 01/04/2015   Procedure: A/V Shuntogram/Fistulagram;  Surgeon: Algernon Huxley, MD;  Location: [place] [facility];  Service: Cardiovascular;  Laterality: Left;  . PERIPHERAL VASCULAR CATHETERIZATION N/A 01/04/2015   Procedure: A/V Shunt Intervention;  Surgeon: Algernon Huxley, MD;  Location: [place] [facility];  Service: Cardiovascular;  Laterality: N/A;  . PERIPHERAL VASCULAR CATHETERIZATION Left 04/13/2015   Procedure: A/V Shuntogram/Fistulagram;  Surgeon: Katha Cabal, MD;  Location: [place] [facility];  Service: Cardiovascular;  Laterality: Left;  . PERIPHERAL VASCULAR CATHETERIZATION Left 04/13/2015   Procedure: A/V Shunt Intervention;  Surgeon: Katha Cabal, MD;  Location: [place] [facility];  Service: Cardiovascular;  Laterality: Left;     OB History    Gravida  3   Para  3   Term  3   Preterm      AB      Living  2     SAB      TAB      Ectopic      Multiple      Live Births  2            Home Medications    Prior to Admission medications   Medication Sig Start Date End Date Taking? Authorizing Provider  albuterol (PROVENTIL) (2.5 MG/3ML) 0.083% nebulizer solution Take 2.5 mg by nebulization 4 (four) times daily as needed (breathing).  [provider]  ALPRAZolam Duanne Moron) 0.25 MG tablet Take 0.25 mg by mouth 2 (two) times daily as needed for anxiety.     [provider]  aspirin EC 81 MG tablet Take 81 mg by mouth daily.    [provider]  chlorhexidine (PERIDEX) 0.12 % solution Use as directed 10 mLs in the mouth or throat 2 (two) times daily.    [provider]  clopidogrel (PLAVIX) 75 MG tablet Take 75 mg by mouth daily.    [provider]  docusate sodium (COLACE) 100 MG capsule Take 100 mg by mouth daily.    [provider]  doxercalciferol (HECTOROL)  4 MCG/2ML injection Inject into the vein.    [provider]  gabapentin (NEURONTIN) 100 MG capsule Take 100 mg by mouth.  05/22/10   [provider]  guaiFENesin (ROBITUSSIN) 100 MG/5ML liquid Take 200 mg by mouth 3 (three) times daily as needed for cough.    [provider]  HYDROcodone-acetaminophen (NORCO/VICODIN) 5-325 MG per tablet Take 2 tablets by mouth every 4 (four) hours as needed. 06/17/13   Tanna Furry, MD  hydroxychloroquine (PLAQUENIL) 200 MG tablet Take 200 mg by mouth daily.    [provider]  ipratropium (ATROVENT) 0.02 % nebulizer solution Take 250 mcg by nebulization 4 (four) times daily as needed (breathing).    [provider]  ipratropium-albuterol (DUONEB) 0.5-2.5 (3) MG/3ML SOLN 3 mLs as needed.  03/21/10   [provider]  lidocaine-prilocaine (EMLA) cream Apply 1 application topically as needed (for port access).  12/20/12   [provider]  loratadine (CLARITIN) 10 MG tablet Take 10 mg by mouth daily as needed for allergies.    [provider]  midodrine (PROAMATINE) 10 MG tablet Take 10 mg by mouth 2 (two) times daily. Monday, Wednesday, and Friday before dialysis    [provider]  mirtazapine (REMERON) 15 MG tablet 15 mg at bedtime.  11/03/13   [provider]  multivitamin (RENA-VIT) TABS tablet Take 1 tablet by mouth daily.    [provider]  ondansetron (ZOFRAN) 8 MG tablet Take by mouth as needed for nausea or vomiting.    [provider]  pantoprazole (PROTONIX) 40 MG tablet Take 40 mg by mouth daily.    [provider]  phenytoin (DILANTIN) 100 MG ER capsule Take 200 mg by mouth 2 (two) times daily.     [provider]  polyethylene glycol (MIRALAX / GLYCOLAX) packet Take 17 g by mouth daily as needed for mild constipation.    [provider]  sertraline (ZOLOFT) 100 MG tablet Take 200 mg by mouth daily.     [provider]    sevelamer carbonate (RENVELA) 800 MG tablet Take 800-1,600 mg by mouth 4 (four) times daily. Takes 1600 mg with each meal and 800 mg with snack    [provider]  simvastatin (ZOCOR) 40 MG tablet Take 40 mg by mouth daily.    [provider]    Family History Family History  Problem Relation Age of Onset  . Seizures Son   . Kidney disease Mother        on dialysis  . Lupus Mother     Social History Social History   Tobacco Use  . Smoking status: Current Every Day Smoker    Packs/day: 0.00    Years: 15.00    Pack years: 0.00    Types: Cigarettes  . Smokeless tobacco: Never Used  .  Tobacco comment: smokes 2 cig daily  Substance Use Topics  . Alcohol use: No  . Drug use: No     Allergies   Dust mite extract   Review of Systems Review of Systems  Unable to perform ROS: Mental status change     Physical Exam Updated Vital Signs BP 105/72 (BP Location: Right Arm)   Pulse (!) 59   Temp 98.1 F (36.7 C) (Oral)   Resp 16   Wt 45.4 kg (100 lb)   LMP 07/29/2017 (Approximate)   SpO2 100%   BMI 17.16 kg/m   Physical Exam  Constitutional: She is oriented to person, place, and time. She appears well-developed.  She appears under nourished and older than stated age  HENT:  Head: Normocephalic and atraumatic.  Eyes: Pupils are equal, round, and reactive to light. Conjunctivae and EOM are normal.  Neck: Normal range of motion and phonation normal. Neck supple.  Cardiovascular: Normal rate.  Right thigh with fresh appearing catheter, sutured wounds present, mild bleeding associated with the track of catheter.  There is a small amount of subcutaneous swelling consistent with hematoma, which when pressed on causes blood to extrude from the opening where the catheter exits.  There is mild associated pain with palpation of this area.  There is no associated pulsatile mass.  Pulmonary/Chest: Effort normal.  Musculoskeletal:  She guards against movement of  the right hip secondary to pain, at the site of dialysis catheter placement in the groin.  See vascular exam findings  Neurological: She is alert and oriented to person, place, and time. She exhibits normal muscle tone.  Skin: Skin is warm and dry.  Psychiatric: She has a normal mood and affect. Her behavior is normal. Judgment and thought content normal.  Nursing note and vitals reviewed.    ED Treatments / Results  Labs (all labs ordered are listed, but only abnormal results are displayed) Labs Reviewed  BASIC METABOLIC PANEL - Abnormal; Notable for the following components:      Result Value   Chloride 97 (*)    BUN 24 (*)    Creatinine, Ser 6.25 (*)    GFR calc non Af Amer 7 (*)    GFR calc Af Amer 9 (*)    All other components within normal limits  CBC WITH DIFFERENTIAL/PLATELET - Abnormal; Notable for the following components:   Hemoglobin 11.9 (*)    HCT 35.5 (*)    All other components within normal limits    EKG None  Radiology No results found.  Procedures Procedures (including critical care time)  Medications Ordered in ED Medications - No data to display   Initial Impression / Assessment and Plan / ED Course  I have reviewed the triage vital signs and the nursing notes.  Pertinent labs & imaging results that were available during my care of the patient were reviewed by me and considered in my medical decision making (see chart for details).      Patient Vitals for the past 24 hrs:  BP Temp Temp src Pulse Resp SpO2 Weight  08/27/17 2340 105/72 98.1 F (36.7 C) Oral (!) 59 16 100 % -  08/27/17 1806 108/72 - - 69 18 100 % -  08/27/17 1745 115/83 97.8 F (36.6 C) Oral 71 16 100 % -  08/27/17 1744 - - - - - - 45.4 kg (100 lb)    Catheter wound assessment, and treatment, x2.  Wound was assessed for active bleeding, oozing was found, which  worsened with pressure and subcutaneous tissue along the catheter tract.  There was no pulsatile bleeding.  Pressure  dressing placed for 30 minutes, after holding direct pressure for 30 minutes.  Oozing decreased somewhat and a pressure dressing was replaced at the time of discharge.  Dressing use consisted of 4 x 4's with [place], applied at less than arterial pressure.  Neurovascular intact distally following placement of the pressure dressing each time.  At discharge- reevaluation with update and discussion. After initial assessment and treatment, an updated evaluation reveals she remains comfortable has been able to tolerate oral hydration and has no further complaints. Daleen Bo   Medical Decision Making: Bleeding, post vascular dialysis catheter placement.  Patient hemodynamically stable with reassuring blood work.  Apparent stable renal physiology, and hemoglobin.  No indication for urgent dialysis, potassium normal.  Hemoglobin is very good, not requiring transfusion.  CRITICAL CARE-no Performed by: Daleen Bo    Nursing Notes Reviewed/ Care Coordinated Applicable Imaging Reviewed Interpretation of Laboratory Data incorporated into ED treatment  The patient appears reasonably screened and/or stabilized for discharge and I doubt any other medical condition or other [facility] requiring further screening, evaluation, or treatment in the ED at this time prior to discharge.  Plan: Home Medications-continue usual; Home Treatments-change dressing daily; return here if the recommended treatment, does not improve the symptoms; Recommended follow up-contact vascular surgery tomorrow for discussion and treatment as needed.      Final Clinical Impressions(s) / ED Diagnoses   Final diagnoses:  Bleeding due to dialysis catheter placement, initial encounter [facility])    ED Discharge Orders    None       Daleen Bo, MD 08/28/17 1646

## 2017-08-31 DIAGNOSIS — Z992 Dependence on renal dialysis: Secondary | ICD-10-CM | POA: Diagnosis not present

## 2017-08-31 DIAGNOSIS — D509 Iron deficiency anemia, unspecified: Secondary | ICD-10-CM | POA: Diagnosis not present

## 2017-08-31 DIAGNOSIS — N2581 Secondary hyperparathyroidism of renal origin: Secondary | ICD-10-CM | POA: Diagnosis not present

## 2017-08-31 DIAGNOSIS — N186 End stage renal disease: Secondary | ICD-10-CM | POA: Diagnosis not present

## 2017-08-31 DIAGNOSIS — D631 Anemia in chronic kidney disease: Secondary | ICD-10-CM | POA: Diagnosis not present

## 2017-09-01 DIAGNOSIS — M329 Systemic lupus erythematosus, unspecified: Secondary | ICD-10-CM | POA: Diagnosis not present

## 2017-09-01 DIAGNOSIS — R569 Unspecified convulsions: Secondary | ICD-10-CM | POA: Diagnosis not present

## 2017-09-01 DIAGNOSIS — I120 Hypertensive chronic kidney disease with stage 5 chronic kidney disease or end stage renal disease: Secondary | ICD-10-CM | POA: Diagnosis not present

## 2017-09-01 DIAGNOSIS — J449 Chronic obstructive pulmonary disease, unspecified: Secondary | ICD-10-CM | POA: Diagnosis not present

## 2017-09-02 ENCOUNTER — Other Ambulatory Visit: Payer: Self-pay | Admitting: Radiology

## 2017-09-02 ENCOUNTER — Other Ambulatory Visit (HOSPITAL_COMMUNITY): Payer: Self-pay | Admitting: Medical

## 2017-09-02 DIAGNOSIS — N186 End stage renal disease: Secondary | ICD-10-CM | POA: Diagnosis not present

## 2017-09-02 DIAGNOSIS — N2581 Secondary hyperparathyroidism of renal origin: Secondary | ICD-10-CM | POA: Diagnosis not present

## 2017-09-02 DIAGNOSIS — Z992 Dependence on renal dialysis: Principal | ICD-10-CM

## 2017-09-02 DIAGNOSIS — D631 Anemia in chronic kidney disease: Secondary | ICD-10-CM | POA: Diagnosis not present

## 2017-09-02 DIAGNOSIS — D509 Iron deficiency anemia, unspecified: Secondary | ICD-10-CM | POA: Diagnosis not present

## 2017-09-03 ENCOUNTER — Other Ambulatory Visit: Payer: Self-pay | Admitting: Student

## 2017-09-04 ENCOUNTER — Ambulatory Visit (HOSPITAL_COMMUNITY)
Admission: RE | Admit: 2017-09-04 | Discharge: 2017-09-04 | Disposition: A | Discharge status: Home / Self Care | Payer: Medicare Other | Source: Ambulatory Visit | Attending: Medical | Admitting: Medical

## 2017-09-04 ENCOUNTER — Other Ambulatory Visit (HOSPITAL_COMMUNITY): Payer: Self-pay | Admitting: Medical

## 2017-09-04 ENCOUNTER — Encounter (HOSPITAL_COMMUNITY): Payer: Self-pay

## 2017-09-04 DIAGNOSIS — N186 End stage renal disease: Secondary | ICD-10-CM

## 2017-09-04 DIAGNOSIS — M329 Systemic lupus erythematosus, unspecified: Secondary | ICD-10-CM | POA: Diagnosis not present

## 2017-09-04 DIAGNOSIS — Z992 Dependence on renal dialysis: Secondary | ICD-10-CM

## 2017-09-04 DIAGNOSIS — I120 Hypertensive chronic kidney disease with stage 5 chronic kidney disease or end stage renal disease: Secondary | ICD-10-CM | POA: Diagnosis not present

## 2017-09-04 DIAGNOSIS — Z7902 Long term (current) use of antithrombotics/antiplatelets: Secondary | ICD-10-CM | POA: Insufficient documentation

## 2017-09-04 DIAGNOSIS — F329 Major depressive disorder, single episode, unspecified: Secondary | ICD-10-CM | POA: Diagnosis not present

## 2017-09-04 DIAGNOSIS — Z7982 Long term (current) use of aspirin: Secondary | ICD-10-CM | POA: Insufficient documentation

## 2017-09-04 DIAGNOSIS — Z86718 Personal history of other venous thrombosis and embolism: Secondary | ICD-10-CM | POA: Insufficient documentation

## 2017-09-04 DIAGNOSIS — Y832 Surgical operation with anastomosis, bypass or graft as the cause of abnormal reaction of the patient, or of later complication, without mention of misadventure at the time of the procedure: Secondary | ICD-10-CM | POA: Diagnosis not present

## 2017-09-04 DIAGNOSIS — T82868A Thrombosis of vascular prosthetic devices, implants and grafts, initial encounter: Secondary | ICD-10-CM | POA: Insufficient documentation

## 2017-09-04 DIAGNOSIS — F17210 Nicotine dependence, cigarettes, uncomplicated: Secondary | ICD-10-CM | POA: Insufficient documentation

## 2017-09-04 HISTORY — PX: IR US GUIDE VASC ACCESS LEFT: IMG2389

## 2017-09-04 HISTORY — PX: IR THROMBECTOMY AV FISTULA W/THROMBOLYSIS INC/SHUNT/IMG LEFT: IMG6105

## 2017-09-04 LAB — PROTIME-INR
INR: 1.14
PROTHROMBIN TIME: 14.5 s (ref 11.4–15.2)

## 2017-09-04 LAB — BASIC METABOLIC PANEL
ANION GAP: 14 (ref 5–15)
BUN: 35 mg/dL — ABNORMAL HIGH (ref 6–20)
CALCIUM: 9.2 mg/dL (ref 8.9–10.3)
CO2: 27 mmol/L (ref 22–32)
CREATININE: 8.63 mg/dL — AB (ref 0.44–1.00)
Chloride: 98 mmol/L — ABNORMAL LOW (ref 101–111)
GFR calc Af Amer: 6 mL/min — ABNORMAL LOW (ref >60)
GFR calc non Af Amer: 5 mL/min — ABNORMAL LOW (ref >60)
GLUCOSE: 85 mg/dL (ref 65–99)
Potassium: 5 mmol/L (ref 3.5–5.1)
Sodium: 139 mmol/L (ref 135–145)

## 2017-09-04 LAB — CBC
HCT: 30.4 % — ABNORMAL LOW (ref 36.0–46.0)
Hemoglobin: 10.2 g/dL — ABNORMAL LOW (ref 12.0–15.0)
MCH: 29.6 pg (ref 26.0–34.0)
MCHC: 33.6 g/dL (ref 30.0–36.0)
MCV: 88.1 fL (ref 78.0–100.0)
Platelets: 241 10*3/uL (ref 150–400)
RBC: 3.45 MIL/uL — ABNORMAL LOW (ref 3.87–5.11)
RDW: 14.5 % (ref 11.5–15.5)
WBC: 9.3 10*3/uL (ref 4.0–10.5)

## 2017-09-04 LAB — HCG, SERUM, QUALITATIVE: Preg, Serum: NEGATIVE

## 2017-09-04 MED ORDER — SODIUM CHLORIDE 0.9 % IV SOLN
INTRAVENOUS | Status: AC | PRN
  Administered 2017-09-04: 10 mL/h via INTRAVENOUS

## 2017-09-04 MED ORDER — MIDAZOLAM HCL 2 MG/2ML IJ SOLN
INTRAMUSCULAR | Status: AC
  Filled 2017-09-04: qty 2

## 2017-09-04 MED ORDER — ALTEPLASE 2 MG IJ SOLR
INTRAMUSCULAR | Status: AC
  Filled 2017-09-04: qty 4

## 2017-09-04 MED ORDER — FENTANYL CITRATE (PF) 100 MCG/2ML IJ SOLN
INTRAMUSCULAR | Status: AC
  Filled 2017-09-04: qty 2

## 2017-09-04 MED ORDER — LIDOCAINE HCL 1 % IJ SOLN
INTRAMUSCULAR | Status: AC
  Filled 2017-09-04: qty 20

## 2017-09-04 MED ORDER — MIDAZOLAM HCL 2 MG/2ML IJ SOLN
INTRAMUSCULAR | Status: AC | PRN
  Administered 2017-09-04 (×2): 0.5 mg via INTRAVENOUS

## 2017-09-04 MED ORDER — HEPARIN SODIUM (PORCINE) 1000 UNIT/ML IJ SOLN
INTRAMUSCULAR | Status: AC
  Filled 2017-09-04: qty 1

## 2017-09-04 MED ORDER — FENTANYL CITRATE (PF) 100 MCG/2ML IJ SOLN
INTRAMUSCULAR | Status: AC | PRN
  Administered 2017-09-04: 12.5 ug via INTRAVENOUS

## 2017-09-04 MED ORDER — IOPAMIDOL (ISOVUE-300) INJECTION 61%
INTRAVENOUS | Status: AC
  Filled 2017-09-04: qty 100

## 2017-09-04 MED ORDER — HEPARIN SODIUM (PORCINE) 1000 UNIT/ML IJ SOLN
INTRAMUSCULAR | Status: AC | PRN
  Administered 2017-09-04: 4000 [IU] via INTRAVENOUS

## 2017-09-04 MED ORDER — SODIUM CHLORIDE 0.9 % IV SOLN: INTRAVENOUS | Status: DC

## 2017-09-04 MED ORDER — ALTEPLASE 2 MG IJ SOLR
INTRAMUSCULAR | Status: AC | PRN
  Administered 2017-09-04: 4 mg

## 2017-09-04 NOTE — Discharge Instructions (Signed)
**Note [name]-Identified via Obfuscation** AV Fistula Thrombectomy, Care After Refer to this sheet in the next few weeks. These instructions provide you with information about caring for yourself after your procedure. Your health care provider may also give you more specific instructions. Your treatment has been planned according to current medical practices, but problems sometimes occur. Call your health care provider if you have any problems or questions after your procedure. What can I expect after the procedure? After your procedure, it is common to:  Feel sore.  Have numbness.  Feel cold.  Feel a vibration (thrill) over the fistula.  Follow these instructions at home:  Incision care  Do not take baths or showers, swim, or use a hot tub until your health care provider approves.  Keep the area around your cut from surgery (incision) clean and dry.  Follow instructions from your health care provider about how to take care of your incision. Make sure you: ? Wash your hands with soap and water before you change your bandage (dressing). If soap and water are not available, use hand sanitizer. ? Change your dressing as told by your health care provider. ? Leave stitches (sutures) and clips in place. They may need to stay in place for 2 weeks or longer. Fistula Care  Check your fistula site every day to make sure the thrill feels the same.  Check your fistula site every day for signs of infection. Watch for: ? Redness. ? Swelling. ? Discharge. ? Tenderness. ? Enlargement.  Keep your arm raised (elevated) while you rest.  Do not lift anything that is heavier than a gallon of milk with the arm that has the fistula.  Do not lie down on your fistula arm.  Do not let anyone draw blood or take a blood pressure reading on your fistula arm.  Do not wear tight jewelry or clothing over your fistula arm. General instructions  Rest at home for a day or two.  Gradually start doing your usual activities again. Ask your surgeon  when you can return to work or school.  Take over-the-counter and prescription medicines only as told by your health care provider.  Keep all follow-up visits as told by your health care provider. This is important. Contact a health care provider if:  You have chills or a fever.  You have pain at your fistula site that is not going away.  You have numbness or coldness at your fistula site that is not going away.  You feel a decrease or a change in the thrill.  You have swelling in your arm or hand.  You have redness, swelling, discharge, tenderness, or enlargement at your fistula site. Get help right away if:  You are bleeding from your fistula site.  You have chest pain.  You have trouble breathing. This information is not intended to replace advice given to you by your health care provider. Make sure you discuss any questions you have with your health care provider. Document Released: 03/03/2005 Document Revised: 07/11/2015 Document Reviewed: 05/24/2014 Elsevier Interactive Patient Education  2018 [employer].

## 2017-09-04 NOTE — H&P (Signed)
Chief Complaint: Patient was seen in consultation today for left upper arm Hero graft thrombolysis at the request of Lyles,Charles  Referring Physician(s): Lyles,Charles  Supervising Physician: Sandi Mariscal  Patient Status: [facility] - [facility]  History of Present Illness: [name] is a 42 y.o. female   ESRD Left upper arm Hero graft -- ?age Last used 2 weeks ago- clotted CK Vasc placed Rt thigh temp cath then Running well-- last used Wednesday Was just seen in [facility] ED 08/28/17 for bleeding at Rt fem cath site Small hematoma has resolved per family member  Last seen in IR for same graft declot 10/30/2012 Successful declot Scheduled now for left upper arm Hero graft thrombolysis with possible angioplasty/stent placement. Possible tunneled catheter if needed  Pt is on Plavix daily Has existing Rt femoral temporary catheter   Past Medical History:  Diagnosis Date  . Anemia   . BV (bacterial vaginosis) 11/08/2013  . Depression   . Dialysis patient ([place])   . DVT (deep venous thrombosis) ([place])   . Hypertension   . Lupus ([place])   . Renal disorder   . Vaginal odor 11/08/2013    Past Surgical History:  Procedure Laterality Date  . A/V SHUNT INTERVENTION N/A 10/24/2016   Procedure: A/V SHUNT INTERVENTION;  Surgeon: Katha Cabal, MD;  Location: [place] [facility];  Service: Cardiovascular;  Laterality: N/A;  . AV FISTULA PLACEMENT    . DIALYSIS/PERMA CATHETER INSERTION  10/23/2016   Procedure: DIALYSIS/PERMA CATHETER INSERTION;  Surgeon: Algernon Huxley, MD;  Location: [place] [facility];  Service: Cardiovascular;;  . PERIPHERAL VASCULAR CATHETERIZATION Left 01/04/2015   Procedure: A/V Shuntogram/Fistulagram;  Surgeon: Algernon Huxley, MD;  Location: [place] [facility];  Service: Cardiovascular;  Laterality: Left;  . PERIPHERAL VASCULAR CATHETERIZATION N/A 01/04/2015   Procedure: A/V Shunt Intervention;  Surgeon: Algernon Huxley, MD;  Location: [place] [facility];  Service:  Cardiovascular;  Laterality: N/A;  . PERIPHERAL VASCULAR CATHETERIZATION Left 04/13/2015   Procedure: A/V Shuntogram/Fistulagram;  Surgeon: Katha Cabal, MD;  Location: [place] [facility];  Service: Cardiovascular;  Laterality: Left;  . PERIPHERAL VASCULAR CATHETERIZATION Left 04/13/2015   Procedure: A/V Shunt Intervention;  Surgeon: Katha Cabal, MD;  Location: [place] [facility];  Service: Cardiovascular;  Laterality: Left;    Allergies: Dust mite extract  Medications: Prior to Admission medications   Medication Sig Start Date End Date Taking? Authorizing Provider  albuterol (PROVENTIL) (2.5 MG/3ML) 0.083% nebulizer solution Take 2.5 mg by nebulization 4 (four) times daily as needed (breathing).    [provider]  ALPRAZolam Duanne Moron) 0.25 MG tablet Take 0.25 mg by mouth 2 (two) times daily as needed for anxiety.     [provider]  aspirin EC 81 MG tablet Take 81 mg by mouth daily.    [provider]  chlorhexidine (PERIDEX) 0.12 % solution Use as directed 10 mLs in the mouth or throat 2 (two) times daily.    [provider]  clopidogrel (PLAVIX) 75 MG tablet Take 75 mg by mouth daily.    [provider]  docusate sodium (COLACE) 100 MG capsule Take 100 mg by mouth daily.    [provider]  doxercalciferol (HECTOROL) 4 MCG/2ML injection Inject into the vein.    [provider]  gabapentin (NEURONTIN) 100 MG capsule Take 100 mg by mouth.  05/22/10   [provider]  guaiFENesin (ROBITUSSIN) 100 MG/5ML liquid Take 200 mg by mouth 3 (three) times daily  as needed for cough.    [provider]  HYDROcodone-acetaminophen (NORCO/VICODIN) 5-325 MG per tablet Take 2 tablets by mouth every 4 (four) hours as needed. 06/17/13   Tanna Furry, MD  hydroxychloroquine (PLAQUENIL) 200 MG tablet Take 200 mg by mouth daily.    [provider]  ipratropium (ATROVENT) 0.02 % nebulizer solution Take 250 mcg by  nebulization 4 (four) times daily as needed (breathing).    [provider]  ipratropium-albuterol (DUONEB) 0.5-2.5 (3) MG/3ML SOLN 3 mLs as needed.  03/21/10   [provider]  lidocaine-prilocaine (EMLA) cream Apply 1 application topically as needed (for port access).  12/20/12   [provider]  loratadine (CLARITIN) 10 MG tablet Take 10 mg by mouth daily as needed for allergies.    [provider]  midodrine (PROAMATINE) 10 MG tablet Take 10 mg by mouth 2 (two) times daily. Monday, Wednesday, and Friday before dialysis    [provider]  mirtazapine (REMERON) 15 MG tablet 15 mg at bedtime.  11/03/13   [provider]  multivitamin (RENA-VIT) TABS tablet Take 1 tablet by mouth daily.    [provider]  ondansetron (ZOFRAN) 8 MG tablet Take by mouth as needed for nausea or vomiting.    [provider]  pantoprazole (PROTONIX) 40 MG tablet Take 40 mg by mouth daily.    [provider]  phenytoin (DILANTIN) 100 MG ER capsule Take 200 mg by mouth 2 (two) times daily.     [provider]  polyethylene glycol (MIRALAX / GLYCOLAX) packet Take 17 g by mouth daily as needed for mild constipation.    [provider]  sertraline (ZOLOFT) 100 MG tablet Take 200 mg by mouth daily.     [provider]  sevelamer carbonate (RENVELA) 800 MG tablet Take 800-1,600 mg by mouth 4 (four) times daily. Takes 1600 mg with each meal and 800 mg with snack    [provider]  simvastatin (ZOCOR) 40 MG tablet Take 40 mg by mouth daily.    [provider]     Family History  Problem Relation Age of Onset  . Seizures Son   . Kidney disease Mother        on dialysis  . Lupus Mother     Social History   Socioeconomic History  . Marital status: Single    Spouse name: Not on file  . Number of children: Not on file  . Years of education: Not on file  . Highest education level: Not on file    Occupational History  . Not on file  Social Needs  . Financial resource strain: Not on file  . Food insecurity:    Worry: Not on file    Inability: Not on file  . Transportation needs:    Medical: Not on file    Non-medical: Not on file  Tobacco Use  . Smoking status: Current Every Day Smoker    Packs/day: 0.00    Years: 15.00    Pack years: 0.00    Types: Cigarettes  . Smokeless tobacco: Never Used  . Tobacco comment: smokes 2 cig daily  Substance and Sexual Activity  . Alcohol use: No  . Drug use: No  . Sexual activity: Never    Birth control/protection: None  Lifestyle  . Physical activity:    Days per week: Not on file    Minutes per session: Not on file  . Stress: Not on file  Relationships  .  Social connections:    Talks on phone: Not on file    Gets together: Not on file    Attends religious service: Not on file    Active member of club or organization: Not on file    Attends meetings of clubs or organizations: Not on file    Relationship status: Not on file  Other Topics Concern  . Not on file  Social History Narrative  . Not on file    Review of Systems: A 12 point ROS discussed and pertinent positives are indicated in the HPI above.  All other systems are negative.  Review of Systems  Constitutional: Negative for activity change, fatigue and fever.  Respiratory: Negative for cough and shortness of breath.   Cardiovascular: Negative for chest pain.  Gastrointestinal: Negative for abdominal pain.  Musculoskeletal: Positive for gait problem.  Neurological: Negative for weakness.  Psychiatric/Behavioral: Negative for confusion.       Pt with mild cognition issues Family in room    Vital Signs: BP 101/69   Pulse (!) 57   Temp 98.3 F (36.8 C) (Oral)   Ht 5\' 3"  (1.6 m)   Wt 100 lb (45.4 kg)   SpO2 94%   BMI 17.71 kg/m   Physical Exam  Constitutional: She is oriented to person, place, and time.  Cardiovascular: Normal rate, regular rhythm and  normal heart sounds.  Pulmonary/Chest: Effort normal.  Abdominal: Soft. Bowel sounds are normal.  Musculoskeletal: Normal range of motion.  Left upper arm Hero graft No thrill no pulses  Neurological: She is alert and oriented to person, place, and time.  Skin: Skin is warm and dry.  Psychiatric: She has a normal mood and affect. Her behavior is normal.  Cognition issues-- mild  Nursing note and vitals reviewed.   Imaging: No results found.  Labs:  CBC: Recent Labs    10/23/16 1115 10/25/16 0427 04/15/17 1910 08/27/17 2048  WBC 11.5* 9.5  --  9.8  HGB 13.3 13.2 13.6 11.9*  HCT 39.6 39.9 40.0 35.5*  PLT 247 205  --  216    COAGS: No results for input(s): INR, APTT in the last 8760 hours.  BMP: Recent Labs    10/23/16 1115  10/25/16 0427 10/26/16 0536 04/15/17 1910 08/27/17 2048  NA  --   --  134* 136 140 137  K 6.9*   < > 6.7* 3.8 4.0 4.9  CL  --   --  94* 90* 96* 97*  CO2  --   --  26 31  --  29  GLUCOSE  --   --  76 86 77 83  BUN  --   --  54* 32* 9 24*  CALCIUM  --   --  9.2 8.8*  --  9.5  CREATININE 12.41*  --  10.15* 7.28* 3.70* 6.25*  GFRNONAA 3*  --  4* 6*  --  7*  GFRAA 4*  --  5* 7*  --  9*   < > = values in this interval not displayed.    LIVER FUNCTION TESTS: No results for input(s): BILITOT, AST, ALT, ALKPHOS, PROT, ALBUMIN in the last 8760 hours.  TUMOR MARKERS: No results for input(s): AFPTM, CEA, CA199, CHROMGRNA in the last 8760 hours.  Assessment and Plan:  Left upper arm Hero graft clotted x 2 weeks Last intervention in IR for same graft 2014 Has existing Rt fem temp cath -- runs well Scheduled for Hero graft thrombolysis (pt is on Plavix daily)  Risks and benefits discussed with the patient's parent including, but not limited to bleeding, infection, vascular injury, pulmonary embolism, need for tunneled HD catheter placement or even death. All of her questions were answered, she is agreeable to proceed. Consent signed and in  chart.   Thank you for this interesting consult.  I greatly enjoyed meeting [name] and look forward to participating in their care.  A copy of this report was sent to the requesting provider on this date.  Electronically Signed: Lavonia Drafts, PA-C 09/04/2017, 10:29 AM   I spent a total of  30 Minutes   in face to face in clinical consultation, greater than 50% of which was counseling/coordinating care for left upper arm Hero graft declot

## 2017-09-04 NOTE — Procedures (Signed)
Pre procedural Dx: ESRD Post procedural Dx: Same.   Technically successful declot of left upper arm HERO graft.   Access is ready for immediate use.  EBL: Trace  Complications: None immediate   Ronny Bacon, MD Pager #: [phone]

## 2017-09-05 DIAGNOSIS — D631 Anemia in chronic kidney disease: Secondary | ICD-10-CM | POA: Diagnosis not present

## 2017-09-05 DIAGNOSIS — N2581 Secondary hyperparathyroidism of renal origin: Secondary | ICD-10-CM | POA: Diagnosis not present

## 2017-09-05 DIAGNOSIS — D509 Iron deficiency anemia, unspecified: Secondary | ICD-10-CM | POA: Diagnosis not present

## 2017-09-05 DIAGNOSIS — Z992 Dependence on renal dialysis: Secondary | ICD-10-CM | POA: Diagnosis not present

## 2017-09-05 DIAGNOSIS — N186 End stage renal disease: Secondary | ICD-10-CM | POA: Diagnosis not present

## 2017-09-07 ENCOUNTER — Other Ambulatory Visit (HOSPITAL_COMMUNITY): Payer: Self-pay | Admitting: Medical

## 2017-09-07 DIAGNOSIS — Z992 Dependence on renal dialysis: Principal | ICD-10-CM

## 2017-09-07 DIAGNOSIS — N2581 Secondary hyperparathyroidism of renal origin: Secondary | ICD-10-CM | POA: Diagnosis not present

## 2017-09-07 DIAGNOSIS — D509 Iron deficiency anemia, unspecified: Secondary | ICD-10-CM | POA: Diagnosis not present

## 2017-09-07 DIAGNOSIS — D631 Anemia in chronic kidney disease: Secondary | ICD-10-CM | POA: Diagnosis not present

## 2017-09-07 DIAGNOSIS — N186 End stage renal disease: Secondary | ICD-10-CM | POA: Diagnosis not present

## 2017-09-09 DIAGNOSIS — N2581 Secondary hyperparathyroidism of renal origin: Secondary | ICD-10-CM | POA: Diagnosis not present

## 2017-09-09 DIAGNOSIS — Z992 Dependence on renal dialysis: Secondary | ICD-10-CM | POA: Diagnosis not present

## 2017-09-09 DIAGNOSIS — D631 Anemia in chronic kidney disease: Secondary | ICD-10-CM | POA: Diagnosis not present

## 2017-09-09 DIAGNOSIS — N186 End stage renal disease: Secondary | ICD-10-CM | POA: Diagnosis not present

## 2017-09-09 DIAGNOSIS — D509 Iron deficiency anemia, unspecified: Secondary | ICD-10-CM | POA: Diagnosis not present

## 2017-09-11 ENCOUNTER — Ambulatory Visit (HOSPITAL_COMMUNITY)
Admission: RE | Admit: 2017-09-11 | Discharge: 2017-09-11 | Disposition: A | Discharge status: Home / Self Care | Payer: Medicare Other | Source: Ambulatory Visit | Attending: Medical | Admitting: Medical

## 2017-09-11 DIAGNOSIS — N186 End stage renal disease: Secondary | ICD-10-CM | POA: Diagnosis not present

## 2017-09-11 DIAGNOSIS — Z992 Dependence on renal dialysis: Secondary | ICD-10-CM | POA: Diagnosis not present

## 2017-09-11 DIAGNOSIS — D509 Iron deficiency anemia, unspecified: Secondary | ICD-10-CM | POA: Diagnosis not present

## 2017-09-11 DIAGNOSIS — D631 Anemia in chronic kidney disease: Secondary | ICD-10-CM | POA: Diagnosis not present

## 2017-09-11 DIAGNOSIS — N2581 Secondary hyperparathyroidism of renal origin: Secondary | ICD-10-CM | POA: Diagnosis not present

## 2017-09-13 DIAGNOSIS — N186 End stage renal disease: Secondary | ICD-10-CM | POA: Diagnosis not present

## 2017-09-13 DIAGNOSIS — Z992 Dependence on renal dialysis: Secondary | ICD-10-CM | POA: Diagnosis not present

## 2017-09-14 DIAGNOSIS — N186 End stage renal disease: Secondary | ICD-10-CM | POA: Diagnosis not present

## 2017-09-14 DIAGNOSIS — D509 Iron deficiency anemia, unspecified: Secondary | ICD-10-CM | POA: Diagnosis not present

## 2017-09-14 DIAGNOSIS — N2581 Secondary hyperparathyroidism of renal origin: Secondary | ICD-10-CM | POA: Diagnosis not present

## 2017-09-14 DIAGNOSIS — D631 Anemia in chronic kidney disease: Secondary | ICD-10-CM | POA: Diagnosis not present

## 2017-09-14 DIAGNOSIS — Z992 Dependence on renal dialysis: Secondary | ICD-10-CM | POA: Diagnosis not present

## 2017-09-16 DIAGNOSIS — D631 Anemia in chronic kidney disease: Secondary | ICD-10-CM | POA: Diagnosis not present

## 2017-09-16 DIAGNOSIS — N2581 Secondary hyperparathyroidism of renal origin: Secondary | ICD-10-CM | POA: Diagnosis not present

## 2017-09-16 DIAGNOSIS — N186 End stage renal disease: Secondary | ICD-10-CM | POA: Diagnosis not present

## 2017-09-16 DIAGNOSIS — Z992 Dependence on renal dialysis: Secondary | ICD-10-CM | POA: Diagnosis not present

## 2017-09-16 DIAGNOSIS — D509 Iron deficiency anemia, unspecified: Secondary | ICD-10-CM | POA: Diagnosis not present

## 2017-09-18 DIAGNOSIS — D631 Anemia in chronic kidney disease: Secondary | ICD-10-CM | POA: Diagnosis not present

## 2017-09-18 DIAGNOSIS — D509 Iron deficiency anemia, unspecified: Secondary | ICD-10-CM | POA: Diagnosis not present

## 2017-09-18 DIAGNOSIS — N186 End stage renal disease: Secondary | ICD-10-CM | POA: Diagnosis not present

## 2017-09-18 DIAGNOSIS — N2581 Secondary hyperparathyroidism of renal origin: Secondary | ICD-10-CM | POA: Diagnosis not present

## 2017-09-18 DIAGNOSIS — Z992 Dependence on renal dialysis: Secondary | ICD-10-CM | POA: Diagnosis not present

## 2017-09-21 DIAGNOSIS — D631 Anemia in chronic kidney disease: Secondary | ICD-10-CM | POA: Diagnosis not present

## 2017-09-21 DIAGNOSIS — N186 End stage renal disease: Secondary | ICD-10-CM | POA: Diagnosis not present

## 2017-09-21 DIAGNOSIS — N2581 Secondary hyperparathyroidism of renal origin: Secondary | ICD-10-CM | POA: Diagnosis not present

## 2017-09-21 DIAGNOSIS — E119 Type 2 diabetes mellitus without complications: Secondary | ICD-10-CM | POA: Diagnosis not present

## 2017-09-21 DIAGNOSIS — D509 Iron deficiency anemia, unspecified: Secondary | ICD-10-CM | POA: Diagnosis not present

## 2017-09-21 DIAGNOSIS — Z992 Dependence on renal dialysis: Secondary | ICD-10-CM | POA: Diagnosis not present

## 2017-09-22 ENCOUNTER — Encounter (HOSPITAL_COMMUNITY): Payer: Self-pay | Admitting: Interventional Radiology

## 2017-09-22 ENCOUNTER — Ambulatory Visit (HOSPITAL_COMMUNITY)
Admission: RE | Admit: 2017-09-22 | Discharge: 2017-09-22 | Disposition: A | Discharge status: Home / Self Care | Payer: Medicare Other | Source: Ambulatory Visit | Attending: Medical | Admitting: Medical

## 2017-09-22 DIAGNOSIS — Z4901 Encounter for fitting and adjustment of extracorporeal dialysis catheter: Secondary | ICD-10-CM | POA: Diagnosis not present

## 2017-09-22 DIAGNOSIS — N186 End stage renal disease: Secondary | ICD-10-CM

## 2017-09-22 DIAGNOSIS — N19 Unspecified kidney failure: Secondary | ICD-10-CM | POA: Diagnosis not present

## 2017-09-22 DIAGNOSIS — Z992 Dependence on renal dialysis: Secondary | ICD-10-CM

## 2017-09-22 HISTORY — PX: IR REMOVAL TUN CV CATH W/O FL: IMG2289

## 2017-09-22 MED ORDER — LIDOCAINE HCL (PF) 1 % IJ SOLN
INTRAMUSCULAR | Status: DC | PRN
  Administered 2017-09-22: 5 mL

## 2017-09-22 MED ORDER — LIDOCAINE HCL 1 % IJ SOLN
INTRAMUSCULAR | Status: AC
  Filled 2017-09-22: qty 20

## 2017-09-22 MED ORDER — CHLORHEXIDINE GLUCONATE 4 % EX LIQD
CUTANEOUS | Status: AC
  Filled 2017-09-22: qty 15

## 2017-09-22 NOTE — Procedures (Signed)
Interventional Radiology Procedure Note  Procedure: removal of right CFV tunneled catheter. Complications: None Recommendations:  - Ok to shower tomorrow - Do not submerge for 7 days - Routine care   Signed,  Dulcy Fanny. Earleen Newport, DO

## 2017-09-23 DIAGNOSIS — D631 Anemia in chronic kidney disease: Secondary | ICD-10-CM | POA: Diagnosis not present

## 2017-09-23 DIAGNOSIS — Z992 Dependence on renal dialysis: Secondary | ICD-10-CM | POA: Diagnosis not present

## 2017-09-23 DIAGNOSIS — D509 Iron deficiency anemia, unspecified: Secondary | ICD-10-CM | POA: Diagnosis not present

## 2017-09-23 DIAGNOSIS — N186 End stage renal disease: Secondary | ICD-10-CM | POA: Diagnosis not present

## 2017-09-23 DIAGNOSIS — N2581 Secondary hyperparathyroidism of renal origin: Secondary | ICD-10-CM | POA: Diagnosis not present

## 2017-09-25 DIAGNOSIS — Z992 Dependence on renal dialysis: Secondary | ICD-10-CM | POA: Diagnosis not present

## 2017-09-25 DIAGNOSIS — N186 End stage renal disease: Secondary | ICD-10-CM | POA: Diagnosis not present

## 2017-09-25 DIAGNOSIS — N2581 Secondary hyperparathyroidism of renal origin: Secondary | ICD-10-CM | POA: Diagnosis not present

## 2017-09-25 DIAGNOSIS — D631 Anemia in chronic kidney disease: Secondary | ICD-10-CM | POA: Diagnosis not present

## 2017-09-25 DIAGNOSIS — D509 Iron deficiency anemia, unspecified: Secondary | ICD-10-CM | POA: Diagnosis not present

## 2017-09-28 DIAGNOSIS — Z992 Dependence on renal dialysis: Secondary | ICD-10-CM | POA: Diagnosis not present

## 2017-09-28 DIAGNOSIS — D631 Anemia in chronic kidney disease: Secondary | ICD-10-CM | POA: Diagnosis not present

## 2017-09-28 DIAGNOSIS — D509 Iron deficiency anemia, unspecified: Secondary | ICD-10-CM | POA: Diagnosis not present

## 2017-09-28 DIAGNOSIS — N2581 Secondary hyperparathyroidism of renal origin: Secondary | ICD-10-CM | POA: Diagnosis not present

## 2017-09-28 DIAGNOSIS — N186 End stage renal disease: Secondary | ICD-10-CM | POA: Diagnosis not present

## 2017-09-30 DIAGNOSIS — N2581 Secondary hyperparathyroidism of renal origin: Secondary | ICD-10-CM | POA: Diagnosis not present

## 2017-09-30 DIAGNOSIS — D509 Iron deficiency anemia, unspecified: Secondary | ICD-10-CM | POA: Diagnosis not present

## 2017-09-30 DIAGNOSIS — Z992 Dependence on renal dialysis: Secondary | ICD-10-CM | POA: Diagnosis not present

## 2017-09-30 DIAGNOSIS — N186 End stage renal disease: Secondary | ICD-10-CM | POA: Diagnosis not present

## 2017-09-30 DIAGNOSIS — D631 Anemia in chronic kidney disease: Secondary | ICD-10-CM | POA: Diagnosis not present

## 2017-10-02 DIAGNOSIS — N2581 Secondary hyperparathyroidism of renal origin: Secondary | ICD-10-CM | POA: Diagnosis not present

## 2017-10-02 DIAGNOSIS — N186 End stage renal disease: Secondary | ICD-10-CM | POA: Diagnosis not present

## 2017-10-02 DIAGNOSIS — D509 Iron deficiency anemia, unspecified: Secondary | ICD-10-CM | POA: Diagnosis not present

## 2017-10-02 DIAGNOSIS — Z992 Dependence on renal dialysis: Secondary | ICD-10-CM | POA: Diagnosis not present

## 2017-10-02 DIAGNOSIS — D631 Anemia in chronic kidney disease: Secondary | ICD-10-CM | POA: Diagnosis not present

## 2017-10-05 DIAGNOSIS — N186 End stage renal disease: Secondary | ICD-10-CM | POA: Diagnosis not present

## 2017-10-05 DIAGNOSIS — D509 Iron deficiency anemia, unspecified: Secondary | ICD-10-CM | POA: Diagnosis not present

## 2017-10-05 DIAGNOSIS — D631 Anemia in chronic kidney disease: Secondary | ICD-10-CM | POA: Diagnosis not present

## 2017-10-05 DIAGNOSIS — Z992 Dependence on renal dialysis: Secondary | ICD-10-CM | POA: Diagnosis not present

## 2017-10-05 DIAGNOSIS — N2581 Secondary hyperparathyroidism of renal origin: Secondary | ICD-10-CM | POA: Diagnosis not present

## 2017-10-07 DIAGNOSIS — N186 End stage renal disease: Secondary | ICD-10-CM | POA: Diagnosis not present

## 2017-10-07 DIAGNOSIS — D631 Anemia in chronic kidney disease: Secondary | ICD-10-CM | POA: Diagnosis not present

## 2017-10-07 DIAGNOSIS — N2581 Secondary hyperparathyroidism of renal origin: Secondary | ICD-10-CM | POA: Diagnosis not present

## 2017-10-07 DIAGNOSIS — D509 Iron deficiency anemia, unspecified: Secondary | ICD-10-CM | POA: Diagnosis not present

## 2017-10-07 DIAGNOSIS — Z992 Dependence on renal dialysis: Secondary | ICD-10-CM | POA: Diagnosis not present

## 2017-10-09 DIAGNOSIS — Z992 Dependence on renal dialysis: Secondary | ICD-10-CM | POA: Diagnosis not present

## 2017-10-09 DIAGNOSIS — N186 End stage renal disease: Secondary | ICD-10-CM | POA: Diagnosis not present

## 2017-10-09 DIAGNOSIS — N2581 Secondary hyperparathyroidism of renal origin: Secondary | ICD-10-CM | POA: Diagnosis not present

## 2017-10-09 DIAGNOSIS — D509 Iron deficiency anemia, unspecified: Secondary | ICD-10-CM | POA: Diagnosis not present

## 2017-10-09 DIAGNOSIS — D631 Anemia in chronic kidney disease: Secondary | ICD-10-CM | POA: Diagnosis not present

## 2017-10-12 DIAGNOSIS — D631 Anemia in chronic kidney disease: Secondary | ICD-10-CM | POA: Diagnosis not present

## 2017-10-12 DIAGNOSIS — D509 Iron deficiency anemia, unspecified: Secondary | ICD-10-CM | POA: Diagnosis not present

## 2017-10-12 DIAGNOSIS — Z992 Dependence on renal dialysis: Secondary | ICD-10-CM | POA: Diagnosis not present

## 2017-10-12 DIAGNOSIS — N2581 Secondary hyperparathyroidism of renal origin: Secondary | ICD-10-CM | POA: Diagnosis not present

## 2017-10-12 DIAGNOSIS — N186 End stage renal disease: Secondary | ICD-10-CM | POA: Diagnosis not present

## 2017-10-14 DIAGNOSIS — D631 Anemia in chronic kidney disease: Secondary | ICD-10-CM | POA: Diagnosis not present

## 2017-10-14 DIAGNOSIS — Z992 Dependence on renal dialysis: Secondary | ICD-10-CM | POA: Diagnosis not present

## 2017-10-14 DIAGNOSIS — D509 Iron deficiency anemia, unspecified: Secondary | ICD-10-CM | POA: Diagnosis not present

## 2017-10-14 DIAGNOSIS — N186 End stage renal disease: Secondary | ICD-10-CM | POA: Diagnosis not present

## 2017-10-14 DIAGNOSIS — N2581 Secondary hyperparathyroidism of renal origin: Secondary | ICD-10-CM | POA: Diagnosis not present

## 2017-10-16 DIAGNOSIS — D631 Anemia in chronic kidney disease: Secondary | ICD-10-CM | POA: Diagnosis not present

## 2017-10-16 DIAGNOSIS — D509 Iron deficiency anemia, unspecified: Secondary | ICD-10-CM | POA: Diagnosis not present

## 2017-10-16 DIAGNOSIS — N186 End stage renal disease: Secondary | ICD-10-CM | POA: Diagnosis not present

## 2017-10-16 DIAGNOSIS — N2581 Secondary hyperparathyroidism of renal origin: Secondary | ICD-10-CM | POA: Diagnosis not present

## 2017-10-16 DIAGNOSIS — Z992 Dependence on renal dialysis: Secondary | ICD-10-CM | POA: Diagnosis not present

## 2017-10-19 DIAGNOSIS — Z992 Dependence on renal dialysis: Secondary | ICD-10-CM | POA: Diagnosis not present

## 2017-10-19 DIAGNOSIS — N186 End stage renal disease: Secondary | ICD-10-CM | POA: Diagnosis not present

## 2017-10-19 DIAGNOSIS — D509 Iron deficiency anemia, unspecified: Secondary | ICD-10-CM | POA: Diagnosis not present

## 2017-10-19 DIAGNOSIS — N2581 Secondary hyperparathyroidism of renal origin: Secondary | ICD-10-CM | POA: Diagnosis not present

## 2017-10-19 DIAGNOSIS — D631 Anemia in chronic kidney disease: Secondary | ICD-10-CM | POA: Diagnosis not present

## 2017-10-21 DIAGNOSIS — D631 Anemia in chronic kidney disease: Secondary | ICD-10-CM | POA: Diagnosis not present

## 2017-10-21 DIAGNOSIS — N186 End stage renal disease: Secondary | ICD-10-CM | POA: Diagnosis not present

## 2017-10-21 DIAGNOSIS — D509 Iron deficiency anemia, unspecified: Secondary | ICD-10-CM | POA: Diagnosis not present

## 2017-10-21 DIAGNOSIS — Z992 Dependence on renal dialysis: Secondary | ICD-10-CM | POA: Diagnosis not present

## 2017-10-21 DIAGNOSIS — N2581 Secondary hyperparathyroidism of renal origin: Secondary | ICD-10-CM | POA: Diagnosis not present

## 2017-10-23 DIAGNOSIS — D631 Anemia in chronic kidney disease: Secondary | ICD-10-CM | POA: Diagnosis not present

## 2017-10-23 DIAGNOSIS — Z992 Dependence on renal dialysis: Secondary | ICD-10-CM | POA: Diagnosis not present

## 2017-10-23 DIAGNOSIS — D509 Iron deficiency anemia, unspecified: Secondary | ICD-10-CM | POA: Diagnosis not present

## 2017-10-23 DIAGNOSIS — N186 End stage renal disease: Secondary | ICD-10-CM | POA: Diagnosis not present

## 2017-10-23 DIAGNOSIS — N2581 Secondary hyperparathyroidism of renal origin: Secondary | ICD-10-CM | POA: Diagnosis not present

## 2017-10-26 DIAGNOSIS — D509 Iron deficiency anemia, unspecified: Secondary | ICD-10-CM | POA: Diagnosis not present

## 2017-10-26 DIAGNOSIS — N2581 Secondary hyperparathyroidism of renal origin: Secondary | ICD-10-CM | POA: Diagnosis not present

## 2017-10-26 DIAGNOSIS — Z992 Dependence on renal dialysis: Secondary | ICD-10-CM | POA: Diagnosis not present

## 2017-10-26 DIAGNOSIS — D631 Anemia in chronic kidney disease: Secondary | ICD-10-CM | POA: Diagnosis not present

## 2017-10-26 DIAGNOSIS — N186 End stage renal disease: Secondary | ICD-10-CM | POA: Diagnosis not present

## 2017-10-28 DIAGNOSIS — N186 End stage renal disease: Secondary | ICD-10-CM | POA: Diagnosis not present

## 2017-10-28 DIAGNOSIS — D631 Anemia in chronic kidney disease: Secondary | ICD-10-CM | POA: Diagnosis not present

## 2017-10-28 DIAGNOSIS — N2581 Secondary hyperparathyroidism of renal origin: Secondary | ICD-10-CM | POA: Diagnosis not present

## 2017-10-28 DIAGNOSIS — D509 Iron deficiency anemia, unspecified: Secondary | ICD-10-CM | POA: Diagnosis not present

## 2017-10-28 DIAGNOSIS — Z992 Dependence on renal dialysis: Secondary | ICD-10-CM | POA: Diagnosis not present

## 2017-10-29 ENCOUNTER — Other Ambulatory Visit (INDEPENDENT_AMBULATORY_CARE_PROVIDER_SITE_OTHER): Payer: Self-pay | Admitting: Vascular Surgery

## 2017-10-29 ENCOUNTER — Ambulatory Visit (INDEPENDENT_AMBULATORY_CARE_PROVIDER_SITE_OTHER): Payer: Medicare Other

## 2017-10-29 ENCOUNTER — Encounter (INDEPENDENT_AMBULATORY_CARE_PROVIDER_SITE_OTHER): Payer: Self-pay

## 2017-10-29 ENCOUNTER — Ambulatory Visit (INDEPENDENT_AMBULATORY_CARE_PROVIDER_SITE_OTHER): Payer: Medicare Other | Admitting: Vascular Surgery

## 2017-10-29 ENCOUNTER — Encounter

## 2017-10-29 DIAGNOSIS — N186 End stage renal disease: Secondary | ICD-10-CM

## 2017-10-30 DIAGNOSIS — D631 Anemia in chronic kidney disease: Secondary | ICD-10-CM | POA: Diagnosis not present

## 2017-10-30 DIAGNOSIS — N2581 Secondary hyperparathyroidism of renal origin: Secondary | ICD-10-CM | POA: Diagnosis not present

## 2017-10-30 DIAGNOSIS — Z992 Dependence on renal dialysis: Secondary | ICD-10-CM | POA: Diagnosis not present

## 2017-10-30 DIAGNOSIS — D509 Iron deficiency anemia, unspecified: Secondary | ICD-10-CM | POA: Diagnosis not present

## 2017-10-30 DIAGNOSIS — N186 End stage renal disease: Secondary | ICD-10-CM | POA: Diagnosis not present

## 2017-11-02 DIAGNOSIS — D509 Iron deficiency anemia, unspecified: Secondary | ICD-10-CM | POA: Diagnosis not present

## 2017-11-02 DIAGNOSIS — Z992 Dependence on renal dialysis: Secondary | ICD-10-CM | POA: Diagnosis not present

## 2017-11-02 DIAGNOSIS — N2581 Secondary hyperparathyroidism of renal origin: Secondary | ICD-10-CM | POA: Diagnosis not present

## 2017-11-02 DIAGNOSIS — N186 End stage renal disease: Secondary | ICD-10-CM | POA: Diagnosis not present

## 2017-11-02 DIAGNOSIS — D631 Anemia in chronic kidney disease: Secondary | ICD-10-CM | POA: Diagnosis not present

## 2017-11-04 DIAGNOSIS — N2581 Secondary hyperparathyroidism of renal origin: Secondary | ICD-10-CM | POA: Diagnosis not present

## 2017-11-04 DIAGNOSIS — D631 Anemia in chronic kidney disease: Secondary | ICD-10-CM | POA: Diagnosis not present

## 2017-11-04 DIAGNOSIS — N186 End stage renal disease: Secondary | ICD-10-CM | POA: Diagnosis not present

## 2017-11-04 DIAGNOSIS — D509 Iron deficiency anemia, unspecified: Secondary | ICD-10-CM | POA: Diagnosis not present

## 2017-11-04 DIAGNOSIS — Z992 Dependence on renal dialysis: Secondary | ICD-10-CM | POA: Diagnosis not present

## 2017-11-06 DIAGNOSIS — D631 Anemia in chronic kidney disease: Secondary | ICD-10-CM | POA: Diagnosis not present

## 2017-11-06 DIAGNOSIS — N186 End stage renal disease: Secondary | ICD-10-CM | POA: Diagnosis not present

## 2017-11-06 DIAGNOSIS — N2581 Secondary hyperparathyroidism of renal origin: Secondary | ICD-10-CM | POA: Diagnosis not present

## 2017-11-06 DIAGNOSIS — D509 Iron deficiency anemia, unspecified: Secondary | ICD-10-CM | POA: Diagnosis not present

## 2017-11-06 DIAGNOSIS — Z992 Dependence on renal dialysis: Secondary | ICD-10-CM | POA: Diagnosis not present

## 2017-11-09 DIAGNOSIS — Z992 Dependence on renal dialysis: Secondary | ICD-10-CM | POA: Diagnosis not present

## 2017-11-09 DIAGNOSIS — D509 Iron deficiency anemia, unspecified: Secondary | ICD-10-CM | POA: Diagnosis not present

## 2017-11-09 DIAGNOSIS — N2581 Secondary hyperparathyroidism of renal origin: Secondary | ICD-10-CM | POA: Diagnosis not present

## 2017-11-09 DIAGNOSIS — N186 End stage renal disease: Secondary | ICD-10-CM | POA: Diagnosis not present

## 2017-11-09 DIAGNOSIS — D631 Anemia in chronic kidney disease: Secondary | ICD-10-CM | POA: Diagnosis not present

## 2017-11-11 DIAGNOSIS — D509 Iron deficiency anemia, unspecified: Secondary | ICD-10-CM | POA: Diagnosis not present

## 2017-11-11 DIAGNOSIS — D631 Anemia in chronic kidney disease: Secondary | ICD-10-CM | POA: Diagnosis not present

## 2017-11-11 DIAGNOSIS — Z992 Dependence on renal dialysis: Secondary | ICD-10-CM | POA: Diagnosis not present

## 2017-11-11 DIAGNOSIS — N2581 Secondary hyperparathyroidism of renal origin: Secondary | ICD-10-CM | POA: Diagnosis not present

## 2017-11-11 DIAGNOSIS — N186 End stage renal disease: Secondary | ICD-10-CM | POA: Diagnosis not present

## 2017-11-13 DIAGNOSIS — Z992 Dependence on renal dialysis: Secondary | ICD-10-CM | POA: Diagnosis not present

## 2017-11-13 DIAGNOSIS — D509 Iron deficiency anemia, unspecified: Secondary | ICD-10-CM | POA: Diagnosis not present

## 2017-11-13 DIAGNOSIS — D631 Anemia in chronic kidney disease: Secondary | ICD-10-CM | POA: Diagnosis not present

## 2017-11-13 DIAGNOSIS — N186 End stage renal disease: Secondary | ICD-10-CM | POA: Diagnosis not present

## 2017-11-13 DIAGNOSIS — N2581 Secondary hyperparathyroidism of renal origin: Secondary | ICD-10-CM | POA: Diagnosis not present

## 2017-11-14 DIAGNOSIS — Z992 Dependence on renal dialysis: Secondary | ICD-10-CM | POA: Diagnosis not present

## 2017-11-14 DIAGNOSIS — N186 End stage renal disease: Secondary | ICD-10-CM | POA: Diagnosis not present

## 2017-11-16 DIAGNOSIS — N186 End stage renal disease: Secondary | ICD-10-CM | POA: Diagnosis not present

## 2017-11-16 DIAGNOSIS — N2581 Secondary hyperparathyroidism of renal origin: Secondary | ICD-10-CM | POA: Diagnosis not present

## 2017-11-16 DIAGNOSIS — D509 Iron deficiency anemia, unspecified: Secondary | ICD-10-CM | POA: Diagnosis not present

## 2017-11-16 DIAGNOSIS — D631 Anemia in chronic kidney disease: Secondary | ICD-10-CM | POA: Diagnosis not present

## 2017-11-16 DIAGNOSIS — Z992 Dependence on renal dialysis: Secondary | ICD-10-CM | POA: Diagnosis not present

## 2017-11-18 DIAGNOSIS — N2581 Secondary hyperparathyroidism of renal origin: Secondary | ICD-10-CM | POA: Diagnosis not present

## 2017-11-18 DIAGNOSIS — D509 Iron deficiency anemia, unspecified: Secondary | ICD-10-CM | POA: Diagnosis not present

## 2017-11-18 DIAGNOSIS — Z992 Dependence on renal dialysis: Secondary | ICD-10-CM | POA: Diagnosis not present

## 2017-11-18 DIAGNOSIS — D631 Anemia in chronic kidney disease: Secondary | ICD-10-CM | POA: Diagnosis not present

## 2017-11-18 DIAGNOSIS — N186 End stage renal disease: Secondary | ICD-10-CM | POA: Diagnosis not present

## 2017-11-19 DIAGNOSIS — E875 Hyperkalemia: Secondary | ICD-10-CM | POA: Diagnosis not present

## 2017-11-20 DIAGNOSIS — N186 End stage renal disease: Secondary | ICD-10-CM | POA: Diagnosis not present

## 2017-11-20 DIAGNOSIS — D631 Anemia in chronic kidney disease: Secondary | ICD-10-CM | POA: Diagnosis not present

## 2017-11-20 DIAGNOSIS — Z992 Dependence on renal dialysis: Secondary | ICD-10-CM | POA: Diagnosis not present

## 2017-11-20 DIAGNOSIS — D509 Iron deficiency anemia, unspecified: Secondary | ICD-10-CM | POA: Diagnosis not present

## 2017-11-20 DIAGNOSIS — N2581 Secondary hyperparathyroidism of renal origin: Secondary | ICD-10-CM | POA: Diagnosis not present

## 2017-11-23 DIAGNOSIS — Z992 Dependence on renal dialysis: Secondary | ICD-10-CM | POA: Diagnosis not present

## 2017-11-23 DIAGNOSIS — D631 Anemia in chronic kidney disease: Secondary | ICD-10-CM | POA: Diagnosis not present

## 2017-11-23 DIAGNOSIS — N186 End stage renal disease: Secondary | ICD-10-CM | POA: Diagnosis not present

## 2017-11-23 DIAGNOSIS — D509 Iron deficiency anemia, unspecified: Secondary | ICD-10-CM | POA: Diagnosis not present

## 2017-11-23 DIAGNOSIS — N2581 Secondary hyperparathyroidism of renal origin: Secondary | ICD-10-CM | POA: Diagnosis not present

## 2017-11-25 DIAGNOSIS — Z1159 Encounter for screening for other viral diseases: Secondary | ICD-10-CM | POA: Diagnosis not present

## 2017-11-25 DIAGNOSIS — D509 Iron deficiency anemia, unspecified: Secondary | ICD-10-CM | POA: Diagnosis not present

## 2017-11-25 DIAGNOSIS — D631 Anemia in chronic kidney disease: Secondary | ICD-10-CM | POA: Diagnosis not present

## 2017-11-25 DIAGNOSIS — N186 End stage renal disease: Secondary | ICD-10-CM | POA: Diagnosis not present

## 2017-11-25 DIAGNOSIS — N2581 Secondary hyperparathyroidism of renal origin: Secondary | ICD-10-CM | POA: Diagnosis not present

## 2017-11-25 DIAGNOSIS — Z992 Dependence on renal dialysis: Secondary | ICD-10-CM | POA: Diagnosis not present

## 2017-11-27 ENCOUNTER — Encounter (INDEPENDENT_AMBULATORY_CARE_PROVIDER_SITE_OTHER): Payer: Self-pay

## 2017-11-27 DIAGNOSIS — N2581 Secondary hyperparathyroidism of renal origin: Secondary | ICD-10-CM | POA: Diagnosis not present

## 2017-11-27 DIAGNOSIS — D631 Anemia in chronic kidney disease: Secondary | ICD-10-CM | POA: Diagnosis not present

## 2017-11-27 DIAGNOSIS — Z992 Dependence on renal dialysis: Secondary | ICD-10-CM | POA: Diagnosis not present

## 2017-11-27 DIAGNOSIS — D509 Iron deficiency anemia, unspecified: Secondary | ICD-10-CM | POA: Diagnosis not present

## 2017-11-27 DIAGNOSIS — N186 End stage renal disease: Secondary | ICD-10-CM | POA: Diagnosis not present

## 2017-11-30 DIAGNOSIS — N186 End stage renal disease: Secondary | ICD-10-CM | POA: Diagnosis not present

## 2017-11-30 DIAGNOSIS — Z992 Dependence on renal dialysis: Secondary | ICD-10-CM | POA: Diagnosis not present

## 2017-11-30 DIAGNOSIS — D631 Anemia in chronic kidney disease: Secondary | ICD-10-CM | POA: Diagnosis not present

## 2017-11-30 DIAGNOSIS — D509 Iron deficiency anemia, unspecified: Secondary | ICD-10-CM | POA: Diagnosis not present

## 2017-11-30 DIAGNOSIS — N2581 Secondary hyperparathyroidism of renal origin: Secondary | ICD-10-CM | POA: Diagnosis not present

## 2017-12-02 DIAGNOSIS — N186 End stage renal disease: Secondary | ICD-10-CM | POA: Diagnosis not present

## 2017-12-02 DIAGNOSIS — Z992 Dependence on renal dialysis: Secondary | ICD-10-CM | POA: Diagnosis not present

## 2017-12-02 DIAGNOSIS — N2581 Secondary hyperparathyroidism of renal origin: Secondary | ICD-10-CM | POA: Diagnosis not present

## 2017-12-02 DIAGNOSIS — D509 Iron deficiency anemia, unspecified: Secondary | ICD-10-CM | POA: Diagnosis not present

## 2017-12-02 DIAGNOSIS — D631 Anemia in chronic kidney disease: Secondary | ICD-10-CM | POA: Diagnosis not present

## 2017-12-04 ENCOUNTER — Other Ambulatory Visit (INDEPENDENT_AMBULATORY_CARE_PROVIDER_SITE_OTHER): Payer: Self-pay | Admitting: Vascular Surgery

## 2017-12-04 DIAGNOSIS — D509 Iron deficiency anemia, unspecified: Secondary | ICD-10-CM | POA: Diagnosis not present

## 2017-12-04 DIAGNOSIS — N186 End stage renal disease: Secondary | ICD-10-CM | POA: Diagnosis not present

## 2017-12-04 DIAGNOSIS — D631 Anemia in chronic kidney disease: Secondary | ICD-10-CM | POA: Diagnosis not present

## 2017-12-04 DIAGNOSIS — Z992 Dependence on renal dialysis: Secondary | ICD-10-CM | POA: Diagnosis not present

## 2017-12-04 DIAGNOSIS — N2581 Secondary hyperparathyroidism of renal origin: Secondary | ICD-10-CM | POA: Diagnosis not present

## 2017-12-07 DIAGNOSIS — D509 Iron deficiency anemia, unspecified: Secondary | ICD-10-CM | POA: Diagnosis not present

## 2017-12-07 DIAGNOSIS — N186 End stage renal disease: Secondary | ICD-10-CM | POA: Diagnosis not present

## 2017-12-07 DIAGNOSIS — Z992 Dependence on renal dialysis: Secondary | ICD-10-CM | POA: Diagnosis not present

## 2017-12-07 DIAGNOSIS — D631 Anemia in chronic kidney disease: Secondary | ICD-10-CM | POA: Diagnosis not present

## 2017-12-07 DIAGNOSIS — N2581 Secondary hyperparathyroidism of renal origin: Secondary | ICD-10-CM | POA: Diagnosis not present

## 2017-12-07 MED ORDER — CEFAZOLIN SODIUM-DEXTROSE 1-4 GM/50ML-% IV SOLN
1.0000 g | Freq: Once | INTRAVENOUS | Status: AC
  Administered 2017-12-08: 1 g via INTRAVENOUS

## 2017-12-08 ENCOUNTER — Encounter
Admission: RE | Disposition: A | Discharge status: Home / Self Care | Payer: Self-pay | Source: Ambulatory Visit | Attending: Vascular Surgery

## 2017-12-08 ENCOUNTER — Encounter: Payer: Self-pay | Admitting: *Deleted

## 2017-12-08 ENCOUNTER — Ambulatory Visit
Admission: RE | Admit: 2017-12-08 | Discharge: 2017-12-08 | Disposition: A | Discharge status: Home / Self Care | Payer: Medicare Other | Source: Ambulatory Visit | Attending: Vascular Surgery | Admitting: Vascular Surgery

## 2017-12-08 DIAGNOSIS — M1990 Unspecified osteoarthritis, unspecified site: Secondary | ICD-10-CM | POA: Insufficient documentation

## 2017-12-08 DIAGNOSIS — I120 Hypertensive chronic kidney disease with stage 5 chronic kidney disease or end stage renal disease: Secondary | ICD-10-CM | POA: Diagnosis not present

## 2017-12-08 DIAGNOSIS — F329 Major depressive disorder, single episode, unspecified: Secondary | ICD-10-CM | POA: Insufficient documentation

## 2017-12-08 DIAGNOSIS — Z841 Family history of disorders of kidney and ureter: Secondary | ICD-10-CM | POA: Diagnosis not present

## 2017-12-08 DIAGNOSIS — T82858A Stenosis of vascular prosthetic devices, implants and grafts, initial encounter: Secondary | ICD-10-CM | POA: Diagnosis not present

## 2017-12-08 DIAGNOSIS — T82868A Thrombosis of vascular prosthetic devices, implants and grafts, initial encounter: Secondary | ICD-10-CM | POA: Diagnosis not present

## 2017-12-08 DIAGNOSIS — Z86718 Personal history of other venous thrombosis and embolism: Secondary | ICD-10-CM | POA: Diagnosis not present

## 2017-12-08 DIAGNOSIS — Z9889 Other specified postprocedural states: Secondary | ICD-10-CM | POA: Insufficient documentation

## 2017-12-08 DIAGNOSIS — Z992 Dependence on renal dialysis: Secondary | ICD-10-CM | POA: Insufficient documentation

## 2017-12-08 DIAGNOSIS — N186 End stage renal disease: Secondary | ICD-10-CM | POA: Diagnosis not present

## 2017-12-08 DIAGNOSIS — Y832 Surgical operation with anastomosis, bypass or graft as the cause of abnormal reaction of the patient, or of later complication, without mention of misadventure at the time of the procedure: Secondary | ICD-10-CM | POA: Insufficient documentation

## 2017-12-08 DIAGNOSIS — Z8269 Family history of other diseases of the musculoskeletal system and connective tissue: Secondary | ICD-10-CM | POA: Insufficient documentation

## 2017-12-08 DIAGNOSIS — Z8619 Personal history of other infectious and parasitic diseases: Secondary | ICD-10-CM | POA: Diagnosis not present

## 2017-12-08 DIAGNOSIS — F17210 Nicotine dependence, cigarettes, uncomplicated: Secondary | ICD-10-CM | POA: Diagnosis not present

## 2017-12-08 DIAGNOSIS — K219 Gastro-esophageal reflux disease without esophagitis: Secondary | ICD-10-CM | POA: Insufficient documentation

## 2017-12-08 DIAGNOSIS — Z9109 Other allergy status, other than to drugs and biological substances: Secondary | ICD-10-CM | POA: Insufficient documentation

## 2017-12-08 DIAGNOSIS — M329 Systemic lupus erythematosus, unspecified: Secondary | ICD-10-CM | POA: Diagnosis not present

## 2017-12-08 DIAGNOSIS — T8241XA Breakdown (mechanical) of vascular dialysis catheter, initial encounter: Secondary | ICD-10-CM | POA: Diagnosis not present

## 2017-12-08 HISTORY — PX: A/V SHUNTOGRAM: CATH118297

## 2017-12-08 LAB — POTASSIUM (ARMC VASCULAR LAB ONLY): POTASSIUM (ARMC VASCULAR LAB): 4.5 (ref 3.5–5.1)

## 2017-12-08 LAB — HCG, QUANTITATIVE, PREGNANCY: hCG, Beta Chain, Quant, S: 1 m[IU]/mL (ref <5)

## 2017-12-08 SURGERY — A/V SHUNTOGRAM
Anesthesia: Moderate Sedation | Laterality: Left

## 2017-12-08 MED ORDER — FENTANYL CITRATE (PF) 100 MCG/2ML IJ SOLN
INTRAMUSCULAR | Status: AC
  Filled 2017-12-08: qty 2

## 2017-12-08 MED ORDER — ONDANSETRON HCL 4 MG/2ML IJ SOLN: 4.0000 mg | Freq: Four times a day (QID) | INTRAMUSCULAR | Status: DC | PRN

## 2017-12-08 MED ORDER — SODIUM CHLORIDE 0.9 % IV SOLN
INTRAVENOUS | Status: DC
  Administered 2017-12-08: 10:00:00 via INTRAVENOUS

## 2017-12-08 MED ORDER — HEPARIN SODIUM (PORCINE) 1000 UNIT/ML IJ SOLN
INTRAMUSCULAR | Status: AC
  Filled 2017-12-08: qty 1

## 2017-12-08 MED ORDER — MIDAZOLAM HCL 2 MG/2ML IJ SOLN
INTRAMUSCULAR | Status: DC | PRN
  Administered 2017-12-08: 2 mg via INTRAVENOUS

## 2017-12-08 MED ORDER — HEPARIN (PORCINE) IN NACL 1000-0.9 UT/500ML-% IV SOLN
INTRAVENOUS | Status: AC
  Filled 2017-12-08: qty 1000

## 2017-12-08 MED ORDER — FAMOTIDINE 20 MG PO TABS: 40.0000 mg | ORAL_TABLET | ORAL | Status: DC | PRN

## 2017-12-08 MED ORDER — HYDROMORPHONE HCL 1 MG/ML IJ SOLN: 1.0000 mg | Freq: Once | INTRAMUSCULAR | Status: DC | PRN

## 2017-12-08 MED ORDER — LIDOCAINE HCL (PF) 1 % IJ SOLN
INTRAMUSCULAR | Status: AC
  Filled 2017-12-08: qty 30

## 2017-12-08 MED ORDER — METHYLPREDNISOLONE SODIUM SUCC 125 MG IJ SOLR: 125.0000 mg | INTRAMUSCULAR | Status: DC | PRN

## 2017-12-08 MED ORDER — HEPARIN SODIUM (PORCINE) 1000 UNIT/ML IJ SOLN
INTRAMUSCULAR | Status: DC | PRN
  Administered 2017-12-08: 3000 [IU] via INTRAVENOUS

## 2017-12-08 MED ORDER — MIDAZOLAM HCL 5 MG/5ML IJ SOLN
INTRAMUSCULAR | Status: AC
  Filled 2017-12-08: qty 5

## 2017-12-08 MED ORDER — FENTANYL CITRATE (PF) 100 MCG/2ML IJ SOLN
INTRAMUSCULAR | Status: DC | PRN
  Administered 2017-12-08: 50 ug via INTRAVENOUS

## 2017-12-08 MED ORDER — IOPAMIDOL (ISOVUE-300) INJECTION 61%
INTRAVENOUS | Status: DC | PRN
  Administered 2017-12-08: 30 mL via INTRAVENOUS

## 2017-12-08 SURGICAL SUPPLY — 16 items
BALLN DORADO 6X60X80 (BALLOONS) ×2
BALLN DORADO 8X60X80 (BALLOONS) ×2
BALLOON DORADO 6X60X80 (BALLOONS) IMPLANT
BALLOON DORADO 8X60X80 (BALLOONS) IMPLANT
CATH BEACON 5 .035 65 KMP TIP (CATHETERS) ×1 IMPLANT
DEVICE PRESTO INFLATION (MISCELLANEOUS) ×1 IMPLANT
DRAPE BRACHIAL (DRAPES) ×1 IMPLANT
PACK ANGIOGRAPHY (CUSTOM PROCEDURE TRAY) ×2 IMPLANT
SET INTRO CAPELLA COAXIAL (SET/KITS/TRAYS/PACK) ×1 IMPLANT
SHEATH BRITE TIP 6FRX5.5 (SHEATH) ×1 IMPLANT
SHEATH BRITE TIP 7FRX5.5 (SHEATH) ×1 IMPLANT
STENT VIABAHN 8X150X120 (Permanent Stent) ×2 IMPLANT
STENT VIABAHN 8X15X120 7FR (Permanent Stent) IMPLANT
SUT MNCRL AB 4-0 PS2 18 (SUTURE) ×1 IMPLANT
WIRE G 018X200 V18 (WIRE) ×1 IMPLANT
WIRE MAGIC TORQUE 260C (WIRE) ×1 IMPLANT

## 2017-12-08 NOTE — Op Note (Signed)
OPERATIVE NOTE   PROCEDURE: 1. Contrast injection left arm hero AV access 2. Percutaneous transluminal angioplasty and stent placement left arm hero graft  PRE-OPERATIVE DIAGNOSIS: Complication of dialysis access                                                       End Stage Renal Disease  POST-OPERATIVE DIAGNOSIS: same as above   SURGEON: Katha Cabal, M.D.  ANESTHESIA: Conscious sedation was administered under my direct supervision by the interventional radiology RN. IV Versed plus fentanyl were utilized. Continuous ECG, pulse oximetry and blood pressure was monitored throughout the entire procedure.  Conscious sedation was for a total of 19 minutes.  ESTIMATED BLOOD LOSS: minimal  FINDING(S): 1. Multiple greater than 90% strictures throughout the loop of the left arm hero graft peripheral segment  SPECIMEN(S):  None  CONTRAST: 30 cc  FLUOROSCOPY TIME: 3.2 minutes  INDICATIONS: [name] is a 42 y.o. female who  presents with malfunctioning left arm hero AV access.  The patient is scheduled for angiography with possible intervention of the AV access.  The patient is aware the risks include but are not limited to: bleeding, infection, thrombosis of the cannulated access, and possible anaphylactic reaction to the contrast.  The patient acknowledges if the access can not be salvaged a tunneled catheter will be needed and will be placed during this procedure.  The patient is aware of the risks of the procedure and elects to proceed with the angiogram and intervention.  DESCRIPTION: After full informed written consent was obtained, the patient was brought back to the Special Procedure suite and placed supine position.  Appropriate cardiopulmonary monitors were placed.  The left arm was prepped and draped in the standard fashion.  Appropriate timeout is called. The left hero graft was cannulated with a micropuncture needle.  Cannulation was performed with ultrasound guidance.  Ultrasound was placed in a sterile sleeve, the AV access was interrogated and noted to be echolucent and compressible indicating patency. Image was recorded for the permanent record. The puncture is performed under continuous ultrasound visualization.   The microwire was advanced and the needle was exchanged for  a microsheath.  The J-wire was then advanced and a 6 Fr sheath inserted.  Hand injections were completed to image the access from the arterial anastomosis through the entire access.  The central venous structures were also imaged by hand injections.  Based on the images,  3000 units of heparin was given and a wire was negotiated through the strictures within the venous portion of the graft.  The areas were then predilated with a 6 mm Dorado balloon inflations were to 20 to 26 atm.  An 8 mm x 150 mm Viabahn was deployed across the stenoses and postdilated with an 8 mm Dorado balloon.  Follow-up imaging demonstrates complete resolution of the stricture with rapid flow of contrast through the graft, the central venous anatomy is preserved.  A 4-0 Monocryl purse-string suture was sewn around the sheath.  The sheath was removed and light pressure was applied.  A sterile bandage was applied to the puncture site.  Interpretation: Initial views of the loop portion or peripheral portion of the hero graft show multiple greater than 90% stenoses within the previously stented segments.  The connector is widely patent and the intravascular portion is  well-positioned and free of kinks.  Outflow is unobstructed.  Inflow is stable when compared to last image.  After antroplasty and stent placement there is less than 10% residual stenosis throughout the entire portion of the graft that is usable for access.  COMPLICATIONS: None  CONDITION: [name], M.D [facility] Office: [phone]  12/08/2017 12:13 PM

## 2017-12-08 NOTE — H&P (Signed)
[facility] Admission History & Physical  MRN : [id number]  [name] is a 42 y.o. (1975/11/23) female who presents with chief complaint of my dialysis access is not working.  History of Present Illness: I am asked to evaluate the patient by the dialysis center. The patient was sent here because they were unable to achieve adequate dialysis this morning. Furthermore the Center states there is very poor thrill and bruit. The patient states there there have been increasing problems with the access, such as "pulling clots" during dialysis and prolonged bleeding after decannulation. The patient estimates these problems have been going on for several weeks. The patient is unaware of any other change.  Patient denies pain or tenderness overlying the access.  There is no pain with dialysis.  The patient denies hand pain or finger pain consistent with steal syndrome.   There have not been many past interventions or declots of this access.  The patient is not chronically hypotensive on dialysis.  Current Facility-Administered Medications  Medication Dose Route Frequency Provider Last Rate Last Dose  . 0.9 %  sodium chloride infusion   Intravenous Continuous Stegmayer, Kimberly A, PA-C 10 mL/hr at 12/08/17 1019    . ceFAZolin (ANCEF) IVPB 1 g/50 mL premix  1 g Intravenous Once Stegmayer, Kimberly A, PA-C      . famotidine (PEPCID) tablet 40 mg  40 mg Oral PRN Stegmayer, Janalyn Harder, PA-C      . HYDROmorphone (DILAUDID) injection 1 mg  1 mg Intravenous Once PRN Stegmayer, Kimberly A, PA-C      . methylPREDNISolone sodium succinate (SOLU-MEDROL) 125 mg/2 mL injection 125 mg  125 mg Intravenous PRN Stegmayer, Kimberly A, PA-C      . ondansetron (ZOFRAN) injection 4 mg  4 mg Intravenous Q6H PRN Stegmayer, Janalyn Harder, PA-C        Past Medical History:  Diagnosis Date  . Anemia   . BV (bacterial vaginosis) 11/08/2013  . Depression   . Dialysis patient ([place])   . DVT (deep  venous thrombosis) ([place])   . Hypertension   . Lupus ([place])   . Renal disorder   . Vaginal odor 11/08/2013    Past Surgical History:  Procedure Laterality Date  . A/V SHUNT INTERVENTION N/A 10/24/2016   Procedure: A/V SHUNT INTERVENTION;  Surgeon: Katha Cabal, MD;  Location: [place] [facility];  Service: Cardiovascular;  Laterality: N/A;  . AV FISTULA PLACEMENT    . DIALYSIS/PERMA CATHETER INSERTION  10/23/2016   Procedure: DIALYSIS/PERMA CATHETER INSERTION;  Surgeon: Algernon Huxley, MD;  Location: [place] [facility];  Service: Cardiovascular;;  . IR REMOVAL TUN CV CATH W/O FL  09/22/2017  . IR THROMBECTOMY AV FISTULA W/THROMBOLYSIS INC/SHUNT/IMG LEFT Left 09/04/2017  . IR US GUIDE VASC ACCESS LEFT  09/04/2017  . PERIPHERAL VASCULAR CATHETERIZATION Left 01/04/2015   Procedure: A/V Shuntogram/Fistulagram;  Surgeon: Algernon Huxley, MD;  Location: [place] [facility];  Service: Cardiovascular;  Laterality: Left;  . PERIPHERAL VASCULAR CATHETERIZATION N/A 01/04/2015   Procedure: A/V Shunt Intervention;  Surgeon: Algernon Huxley, MD;  Location: [place] [facility];  Service: Cardiovascular;  Laterality: N/A;  . PERIPHERAL VASCULAR CATHETERIZATION Left 04/13/2015   Procedure: A/V Shuntogram/Fistulagram;  Surgeon: Katha Cabal, MD;  Location: [place] [facility];  Service: Cardiovascular;  Laterality: Left;  . PERIPHERAL VASCULAR CATHETERIZATION Left 04/13/2015   Procedure: A/V Shunt Intervention;  Surgeon: Katha Cabal, MD;  Location: [place] [facility];  Service:  Cardiovascular;  Laterality: Left;    Social History Social History   Tobacco Use  . Smoking status: Current Every Day Smoker    Packs/day: 0.00    Years: 15.00    Pack years: 0.00    Types: Cigarettes  . Smokeless tobacco: Never Used  . Tobacco comment: smokes 2 cig daily  Substance Use Topics  . Alcohol use: No  . Drug use: No    Family History Family History  Problem Relation Age of Onset  . Seizures Son   .  Kidney disease Mother        on dialysis  . Lupus Mother     No family history of bleeding or clotting disorders, autoimmune disease or porphyria  Allergies  Allergen Reactions  . Dust Mite Extract Other (See Comments)    sneezing     REVIEW OF SYSTEMS (Negative unless checked)  Constitutional: [] Weight loss  [] Fever  [] Chills Cardiac: [] Chest pain   [] Chest pressure   [] Palpitations   [] Shortness of breath when laying flat   [] Shortness of breath at rest   [x] Shortness of breath with exertion. Vascular:  [] Pain in legs with walking   [] Pain in legs at rest   [] Pain in legs when laying flat   [] Claudication   [] Pain in feet when walking  [] Pain in feet at rest  [] Pain in feet when laying flat   [] History of DVT   [] Phlebitis   [] Swelling in legs   [] Varicose veins   [] Non-healing ulcers Pulmonary:   [] Uses home oxygen   [] Productive cough   [] Hemoptysis   [] Wheeze  [] COPD   [] Asthma Neurologic:  [] Dizziness  [] Blackouts   [] Seizures   [] History of stroke   [] History of TIA  [] Aphasia   [] Temporary blindness   [] Dysphagia   [] Weakness or numbness in arms   [] Weakness or numbness in legs Musculoskeletal:  [] Arthritis   [] Joint swelling   [] Joint pain   [] Low back pain Hematologic:  [] Easy bruising  [] Easy bleeding   [] Hypercoagulable state   [] Anemic  [] Hepatitis Gastrointestinal:  [] Blood in stool   [] Vomiting blood  [] Gastroesophageal reflux/heartburn   [] Difficulty swallowing. Genitourinary:  [x] Chronic kidney disease   [] Difficult urination  [] Frequent urination  [] Burning with urination   [] Blood in urine Skin:  [] Rashes   [] Ulcers   [] Wounds Psychological:  [] History of anxiety   []  History of major depression.  Physical Examination  Vitals:   12/08/17 1012  BP: 114/80  Pulse: 75  Resp: 16  Temp: 97.6 F (36.4 C)  TempSrc: Oral  SpO2: 100%  Weight: 45 kg  Height: 5\' 3"  (1.6 m)   Body mass index is 17.57 kg/m. Gen: WD/WN, NAD Head: [place]/AT, No temporalis wasting. Prominent  temp pulse not noted. Ear/Nose/Throat: Hearing grossly intact, nares w/o erythema or drainage, oropharynx w/o Erythema/Exudate,  Eyes: Conjunctiva clear, sclera non-icteric Neck: Trachea midline.  No JVD.  Pulmonary:  Good air movement, respirations not labored, no use of accessory muscles.  Cardiac: RRR, normal S1, S2. Vascular: Left arm hero graft markedly pulsatile Vessel Right Left  Radial Palpable Palpable  Ulnar Not Palpable Not Palpable  Brachial Palpable Palpable  Carotid Palpable, without bruit Palpable, without bruit  Gastrointestinal: soft, non-tender/non-distended. No guarding/reflex.  Musculoskeletal: M/S 5/5 throughout.  Extremities without ischemic changes.  No deformity or atrophy.  Neurologic: Sensation grossly intact in extremities.  Symmetrical.  Speech is fluent. Motor exam as listed above. Psychiatric: Judgment intact, Mood & affect appropriate for pt's clinical situation. Dermatologic: No  rashes or ulcers noted.  No cellulitis or open wounds. Lymph : No Cervical, Axillary, or Inguinal lymphadenopathy.   CBC Lab Results  Component Value Date   WBC 9.3 09/04/2017   HGB 10.2 (L) 09/04/2017   HCT 30.4 (L) 09/04/2017   MCV 88.1 09/04/2017   PLT 241 09/04/2017    BMET    Component Value Date/Time   NA 139 09/04/2017 1022   NA 137 02/18/2013 0440   K 5.0 09/04/2017 1022   K 4.9 07/06/2014 1308   CL 98 (L) 09/04/2017 1022   CL 100 02/18/2013 0440   CO2 27 09/04/2017 1022   CO2 33 (H) 02/18/2013 0440   GLUCOSE 85 09/04/2017 1022   GLUCOSE 73 02/18/2013 0440   BUN 35 (H) 09/04/2017 1022   BUN 9 02/18/2013 0440   CREATININE 8.63 (H) 09/04/2017 1022   CREATININE 1.93 (H) 02/18/2013 0440   CALCIUM 9.2 09/04/2017 1022   CALCIUM 8.2 (L) 02/18/2013 0440   CALCIUM (LL) 04/08/2007 1300    6.1 Result repeated and verified. CRITICAL VALUE NOTED.  VALUE IS CONSISTENT WITH PREVIOUSLY REPORTED AND CALLED VALUE. CRITICAL RESULT CALLED TO, READ BACK BY AND VERIFIED  WITH: MENIGOSTO,A RN [id number] [place] S.   GFRNONAA 5 (L) 09/04/2017 1022   GFRNONAA 32 (L) 02/18/2013 0440   GFRAA 6 (L) 09/04/2017 1022   GFRAA 37 (L) 02/18/2013 0440   CrCl cannot be calculated (Patient's most recent lab result is older than the maximum 21 days allowed.).  COAG Lab Results  Component Value Date   INR 1.14 09/04/2017   INR 1.1 10/06/2008   INR 1.6 (H) 02/11/2008    Radiology No results found.  Assessment/Plan 1.  Complication dialysis device with thrombosis AV access:  Patient's left arm hero dialysis access is malfunctioning. The patient will undergo angiography and correction of any problems using interventional techniques with the hope of restoring function to the access.  The risks and benefits were described to the patient.  All questions were answered.  The patient agrees to proceed with angiography and intervention. Potassium will be drawn to ensure that it is an appropriate level prior to performing intervention. 2.  End-stage renal disease requiring hemodialysis:  Patient will continue dialysis therapy without further interruption if a successful intervention is not achieved then a tunneled catheter will be placed. Dialysis has already been arranged. 3.  Hypertension:  Patient will continue medical management; nephrology is following no changes in oral medications. 4.  GERD:  Continue PPI as already ordered, this medication has been reviewed and there are no changes at this time.  Avoidence of caffeine and alcohol.  Moderate elevation of the head of the bed 5.  Osteoarthritis:  Continue NSAID medications as already ordered, these medications have been reviewed and there are no changes at this time.  Continued activity and therapy was stressed.     Hortencia Pilar, MD  12/08/2017 10:23 AM

## 2017-12-09 DIAGNOSIS — D631 Anemia in chronic kidney disease: Secondary | ICD-10-CM | POA: Diagnosis not present

## 2017-12-09 DIAGNOSIS — N186 End stage renal disease: Secondary | ICD-10-CM | POA: Diagnosis not present

## 2017-12-09 DIAGNOSIS — Z992 Dependence on renal dialysis: Secondary | ICD-10-CM | POA: Diagnosis not present

## 2017-12-09 DIAGNOSIS — N2581 Secondary hyperparathyroidism of renal origin: Secondary | ICD-10-CM | POA: Diagnosis not present

## 2017-12-09 DIAGNOSIS — D509 Iron deficiency anemia, unspecified: Secondary | ICD-10-CM | POA: Diagnosis not present

## 2017-12-11 DIAGNOSIS — D631 Anemia in chronic kidney disease: Secondary | ICD-10-CM | POA: Diagnosis not present

## 2017-12-11 DIAGNOSIS — Z992 Dependence on renal dialysis: Secondary | ICD-10-CM | POA: Diagnosis not present

## 2017-12-11 DIAGNOSIS — D509 Iron deficiency anemia, unspecified: Secondary | ICD-10-CM | POA: Diagnosis not present

## 2017-12-11 DIAGNOSIS — N186 End stage renal disease: Secondary | ICD-10-CM | POA: Diagnosis not present

## 2017-12-11 DIAGNOSIS — N2581 Secondary hyperparathyroidism of renal origin: Secondary | ICD-10-CM | POA: Diagnosis not present

## 2017-12-14 DIAGNOSIS — N2581 Secondary hyperparathyroidism of renal origin: Secondary | ICD-10-CM | POA: Diagnosis not present

## 2017-12-14 DIAGNOSIS — D631 Anemia in chronic kidney disease: Secondary | ICD-10-CM | POA: Diagnosis not present

## 2017-12-14 DIAGNOSIS — N186 End stage renal disease: Secondary | ICD-10-CM | POA: Diagnosis not present

## 2017-12-14 DIAGNOSIS — D509 Iron deficiency anemia, unspecified: Secondary | ICD-10-CM | POA: Diagnosis not present

## 2017-12-14 DIAGNOSIS — Z992 Dependence on renal dialysis: Secondary | ICD-10-CM | POA: Diagnosis not present

## 2017-12-16 DIAGNOSIS — N2581 Secondary hyperparathyroidism of renal origin: Secondary | ICD-10-CM | POA: Diagnosis not present

## 2017-12-16 DIAGNOSIS — N186 End stage renal disease: Secondary | ICD-10-CM | POA: Diagnosis not present

## 2017-12-16 DIAGNOSIS — D509 Iron deficiency anemia, unspecified: Secondary | ICD-10-CM | POA: Diagnosis not present

## 2017-12-16 DIAGNOSIS — D631 Anemia in chronic kidney disease: Secondary | ICD-10-CM | POA: Diagnosis not present

## 2017-12-16 DIAGNOSIS — Z992 Dependence on renal dialysis: Secondary | ICD-10-CM | POA: Diagnosis not present

## 2017-12-18 DIAGNOSIS — D509 Iron deficiency anemia, unspecified: Secondary | ICD-10-CM | POA: Diagnosis not present

## 2017-12-18 DIAGNOSIS — D631 Anemia in chronic kidney disease: Secondary | ICD-10-CM | POA: Diagnosis not present

## 2017-12-18 DIAGNOSIS — N2581 Secondary hyperparathyroidism of renal origin: Secondary | ICD-10-CM | POA: Diagnosis not present

## 2017-12-18 DIAGNOSIS — N186 End stage renal disease: Secondary | ICD-10-CM | POA: Diagnosis not present

## 2017-12-18 DIAGNOSIS — Z992 Dependence on renal dialysis: Secondary | ICD-10-CM | POA: Diagnosis not present

## 2017-12-21 DIAGNOSIS — D631 Anemia in chronic kidney disease: Secondary | ICD-10-CM | POA: Diagnosis not present

## 2017-12-21 DIAGNOSIS — Z992 Dependence on renal dialysis: Secondary | ICD-10-CM | POA: Diagnosis not present

## 2017-12-21 DIAGNOSIS — N186 End stage renal disease: Secondary | ICD-10-CM | POA: Diagnosis not present

## 2017-12-21 DIAGNOSIS — N2581 Secondary hyperparathyroidism of renal origin: Secondary | ICD-10-CM | POA: Diagnosis not present

## 2017-12-21 DIAGNOSIS — D509 Iron deficiency anemia, unspecified: Secondary | ICD-10-CM | POA: Diagnosis not present

## 2017-12-23 DIAGNOSIS — Z992 Dependence on renal dialysis: Secondary | ICD-10-CM | POA: Diagnosis not present

## 2017-12-23 DIAGNOSIS — D631 Anemia in chronic kidney disease: Secondary | ICD-10-CM | POA: Diagnosis not present

## 2017-12-23 DIAGNOSIS — N2581 Secondary hyperparathyroidism of renal origin: Secondary | ICD-10-CM | POA: Diagnosis not present

## 2017-12-23 DIAGNOSIS — N186 End stage renal disease: Secondary | ICD-10-CM | POA: Diagnosis not present

## 2017-12-23 DIAGNOSIS — D509 Iron deficiency anemia, unspecified: Secondary | ICD-10-CM | POA: Diagnosis not present

## 2017-12-25 DIAGNOSIS — D509 Iron deficiency anemia, unspecified: Secondary | ICD-10-CM | POA: Diagnosis not present

## 2017-12-25 DIAGNOSIS — Z992 Dependence on renal dialysis: Secondary | ICD-10-CM | POA: Diagnosis not present

## 2017-12-25 DIAGNOSIS — N2581 Secondary hyperparathyroidism of renal origin: Secondary | ICD-10-CM | POA: Diagnosis not present

## 2017-12-25 DIAGNOSIS — N186 End stage renal disease: Secondary | ICD-10-CM | POA: Diagnosis not present

## 2017-12-25 DIAGNOSIS — D631 Anemia in chronic kidney disease: Secondary | ICD-10-CM | POA: Diagnosis not present

## 2017-12-28 DIAGNOSIS — Z992 Dependence on renal dialysis: Secondary | ICD-10-CM | POA: Diagnosis not present

## 2017-12-28 DIAGNOSIS — E119 Type 2 diabetes mellitus without complications: Secondary | ICD-10-CM | POA: Diagnosis not present

## 2017-12-28 DIAGNOSIS — N2581 Secondary hyperparathyroidism of renal origin: Secondary | ICD-10-CM | POA: Diagnosis not present

## 2017-12-28 DIAGNOSIS — D509 Iron deficiency anemia, unspecified: Secondary | ICD-10-CM | POA: Diagnosis not present

## 2017-12-28 DIAGNOSIS — N186 End stage renal disease: Secondary | ICD-10-CM | POA: Diagnosis not present

## 2017-12-28 DIAGNOSIS — D631 Anemia in chronic kidney disease: Secondary | ICD-10-CM | POA: Diagnosis not present

## 2017-12-30 ENCOUNTER — Encounter (INDEPENDENT_AMBULATORY_CARE_PROVIDER_SITE_OTHER): Payer: Medicare Other

## 2017-12-30 ENCOUNTER — Ambulatory Visit (INDEPENDENT_AMBULATORY_CARE_PROVIDER_SITE_OTHER): Payer: Medicare Other | Admitting: Nurse Practitioner

## 2017-12-30 DIAGNOSIS — N2581 Secondary hyperparathyroidism of renal origin: Secondary | ICD-10-CM | POA: Diagnosis not present

## 2017-12-30 DIAGNOSIS — N186 End stage renal disease: Secondary | ICD-10-CM | POA: Diagnosis not present

## 2017-12-30 DIAGNOSIS — D509 Iron deficiency anemia, unspecified: Secondary | ICD-10-CM | POA: Diagnosis not present

## 2017-12-30 DIAGNOSIS — Z992 Dependence on renal dialysis: Secondary | ICD-10-CM | POA: Diagnosis not present

## 2017-12-30 DIAGNOSIS — D631 Anemia in chronic kidney disease: Secondary | ICD-10-CM | POA: Diagnosis not present

## 2018-01-01 DIAGNOSIS — N2581 Secondary hyperparathyroidism of renal origin: Secondary | ICD-10-CM | POA: Diagnosis not present

## 2018-01-01 DIAGNOSIS — Z992 Dependence on renal dialysis: Secondary | ICD-10-CM | POA: Diagnosis not present

## 2018-01-01 DIAGNOSIS — D631 Anemia in chronic kidney disease: Secondary | ICD-10-CM | POA: Diagnosis not present

## 2018-01-01 DIAGNOSIS — N186 End stage renal disease: Secondary | ICD-10-CM | POA: Diagnosis not present

## 2018-01-01 DIAGNOSIS — D509 Iron deficiency anemia, unspecified: Secondary | ICD-10-CM | POA: Diagnosis not present

## 2018-01-04 DIAGNOSIS — D631 Anemia in chronic kidney disease: Secondary | ICD-10-CM | POA: Diagnosis not present

## 2018-01-04 DIAGNOSIS — D509 Iron deficiency anemia, unspecified: Secondary | ICD-10-CM | POA: Diagnosis not present

## 2018-01-04 DIAGNOSIS — N186 End stage renal disease: Secondary | ICD-10-CM | POA: Diagnosis not present

## 2018-01-04 DIAGNOSIS — Z992 Dependence on renal dialysis: Secondary | ICD-10-CM | POA: Diagnosis not present

## 2018-01-04 DIAGNOSIS — N2581 Secondary hyperparathyroidism of renal origin: Secondary | ICD-10-CM | POA: Diagnosis not present

## 2018-01-06 DIAGNOSIS — N186 End stage renal disease: Secondary | ICD-10-CM | POA: Diagnosis not present

## 2018-01-06 DIAGNOSIS — N2581 Secondary hyperparathyroidism of renal origin: Secondary | ICD-10-CM | POA: Diagnosis not present

## 2018-01-06 DIAGNOSIS — Z992 Dependence on renal dialysis: Secondary | ICD-10-CM | POA: Diagnosis not present

## 2018-01-06 DIAGNOSIS — D631 Anemia in chronic kidney disease: Secondary | ICD-10-CM | POA: Diagnosis not present

## 2018-01-06 DIAGNOSIS — D509 Iron deficiency anemia, unspecified: Secondary | ICD-10-CM | POA: Diagnosis not present

## 2018-01-07 ENCOUNTER — Ambulatory Visit (INDEPENDENT_AMBULATORY_CARE_PROVIDER_SITE_OTHER): Payer: Medicare Other | Admitting: Nurse Practitioner

## 2018-01-07 ENCOUNTER — Ambulatory Visit (INDEPENDENT_AMBULATORY_CARE_PROVIDER_SITE_OTHER): Payer: Medicare Other

## 2018-01-07 ENCOUNTER — Encounter (INDEPENDENT_AMBULATORY_CARE_PROVIDER_SITE_OTHER): Payer: Self-pay | Admitting: Nurse Practitioner

## 2018-01-07 ENCOUNTER — Other Ambulatory Visit (INDEPENDENT_AMBULATORY_CARE_PROVIDER_SITE_OTHER): Payer: Self-pay | Admitting: Vascular Surgery

## 2018-01-07 VITALS — BP 113/71 | HR 78 | Resp 16 | Ht 63.0 in | Wt 107.0 lb

## 2018-01-07 DIAGNOSIS — Z992 Dependence on renal dialysis: Secondary | ICD-10-CM | POA: Diagnosis not present

## 2018-01-07 DIAGNOSIS — I120 Hypertensive chronic kidney disease with stage 5 chronic kidney disease or end stage renal disease: Secondary | ICD-10-CM | POA: Diagnosis not present

## 2018-01-07 DIAGNOSIS — Z95820 Peripheral vascular angioplasty status with implants and grafts: Secondary | ICD-10-CM

## 2018-01-07 DIAGNOSIS — F17210 Nicotine dependence, cigarettes, uncomplicated: Secondary | ICD-10-CM

## 2018-01-07 DIAGNOSIS — K219 Gastro-esophageal reflux disease without esophagitis: Secondary | ICD-10-CM

## 2018-01-07 DIAGNOSIS — I10 Essential (primary) hypertension: Secondary | ICD-10-CM

## 2018-01-07 DIAGNOSIS — N186 End stage renal disease: Secondary | ICD-10-CM

## 2018-01-07 NOTE — Progress Notes (Signed)
Subjective:    Patient ID: [name], female    DOB: 01-Jun-1975, 42 y.o.   MRN: [id number] Chief Complaint  Patient presents with  . Follow-up    [facility] 2week for HDA    HPI  [name] is a 42 y.o. female that returns to the office for followup status post intervention of her Left upper extremity hero graft on 12/08/2017. Following the intervention the excess function was unchanged per the patient.  The patient continues to be experiencing increased bleeding times following decannulation and increased recirculation with diminished efficiency of their dialysis. The patient denies an increase in arm swelling. At the present time the patient denies hand pain.  The patient denies amaurosis fugax or recent TIA symptoms. There are no recent neurological changes noted. The patient denies claudication symptoms or rest pain symptoms. The patient denies history of DVT, PE or superficial thrombophlebitis. The patient denies recent episodes of angina or shortness of breath.  Patient denies any fever, chills, nausea, vomiting diarrhea.   The patient underwent an HDA today which revealed a flow volume of 815 with elevated velocities at the proximal graft of 627.  A residual lumen of 0.2 cm proximal to the stent. Past Medical History:  Diagnosis Date  . Anemia   . BV (bacterial vaginosis) 11/08/2013  . Depression   . Dialysis patient ([place])   . DVT (deep venous thrombosis) ([place])   . Hypertension   . Lupus ([place])   . Renal disorder   . Vaginal odor 11/08/2013    Past Surgical History:  Procedure Laterality Date  . A/V SHUNT INTERVENTION N/A 10/24/2016   Procedure: A/V SHUNT INTERVENTION;  Surgeon: Katha Cabal, MD;  Location: [place] [facility];  Service: Cardiovascular;  Laterality: N/A;  . A/V SHUNTOGRAM Left 12/08/2017   Procedure: A/V SHUNTOGRAM;  Surgeon: Katha Cabal, MD;  Location: [place] [facility];  Service: Cardiovascular;  Laterality: Left;  . AV FISTULA  PLACEMENT    . DIALYSIS/PERMA CATHETER INSERTION  10/23/2016   Procedure: DIALYSIS/PERMA CATHETER INSERTION;  Surgeon: Algernon Huxley, MD;  Location: [place] [facility];  Service: Cardiovascular;;  . IR REMOVAL TUN CV CATH W/O FL  09/22/2017  . IR THROMBECTOMY AV FISTULA W/THROMBOLYSIS INC/SHUNT/IMG LEFT Left 09/04/2017  . IR US GUIDE VASC ACCESS LEFT  09/04/2017  . PERIPHERAL VASCULAR CATHETERIZATION Left 01/04/2015   Procedure: A/V Shuntogram/Fistulagram;  Surgeon: Algernon Huxley, MD;  Location: [place] [facility];  Service: Cardiovascular;  Laterality: Left;  . PERIPHERAL VASCULAR CATHETERIZATION N/A 01/04/2015   Procedure: A/V Shunt Intervention;  Surgeon: Algernon Huxley, MD;  Location: [place] [facility];  Service: Cardiovascular;  Laterality: N/A;  . PERIPHERAL VASCULAR CATHETERIZATION Left 04/13/2015   Procedure: A/V Shuntogram/Fistulagram;  Surgeon: Katha Cabal, MD;  Location: [place] [facility];  Service: Cardiovascular;  Laterality: Left;  . PERIPHERAL VASCULAR CATHETERIZATION Left 04/13/2015   Procedure: A/V Shunt Intervention;  Surgeon: Katha Cabal, MD;  Location: [place] [facility];  Service: Cardiovascular;  Laterality: Left;    Social History   Socioeconomic History  . Marital status: Single    Spouse name: Not on file  . Number of children: Not on file  . Years of education: Not on file  . Highest education level: Not on file  Occupational History  . Not on file  Social Needs  . Financial resource strain: Not on file  . Food insecurity:    Worry: Not on file  Inability: Not on file  . Transportation needs:    Medical: Not on file    Non-medical: Not on file  Tobacco Use  . Smoking status: Current Every Day Smoker    Packs/day: 0.00    Years: 15.00    Pack years: 0.00    Types: Cigarettes  . Smokeless tobacco: Never Used  . Tobacco comment: smokes 2 cig daily  Substance and Sexual Activity  . Alcohol use: No  . Drug use: No  . Sexual activity: Not  Currently    Birth control/protection: None  Lifestyle  . Physical activity:    Days per week: Not on file    Minutes per session: Not on file  . Stress: Not on file  Relationships  . Social connections:    Talks on phone: Not on file    Gets together: Not on file    Attends religious service: Not on file    Active member of club or organization: Not on file    Attends meetings of clubs or organizations: Not on file    Relationship status: Not on file  . Intimate partner violence:    Fear of current or ex partner: Not on file    Emotionally abused: Not on file    Physically abused: Not on file    Forced sexual activity: Not on file  Other Topics Concern  . Not on file  Social History Narrative  . Not on file    Family History  Problem Relation Age of Onset  . Seizures Son   . Kidney disease Mother        on dialysis  . Lupus Mother     Allergies  Allergen Reactions  . Dust Mite Extract Other (See Comments)    sneezing     Review of Systems   Review of Systems: Negative Unless Checked Constitutional: [] Weight loss  [] Fever  [] Chills Cardiac: [] Chest pain   []  Atrial Fibrillation  [] Palpitations   [] Shortness of breath when laying flat   [] Shortness of breath with exertion. Vascular:  [] Pain in legs with walking   [] Pain in legs with standing  [x] History of DVT   [] Phlebitis   [] Swelling in legs   [] Varicose veins   [] Non-healing ulcers Pulmonary:   [] Uses home oxygen   [] Productive cough   [] Hemoptysis   [] Wheeze  [] COPD   [] Asthma Neurologic:  [] Dizziness   [] Seizures   [] History of stroke   [] History of TIA  [] Aphasia   [] Vissual changes   [] Weakness or numbness in arm   [] Weakness or numbness in leg Musculoskeletal:   [] Joint swelling   [] Joint pain   [] Low back pain  []  History of Knee Replacement Hematologic:  [] Easy bruising  [] Easy bleeding   [] Hypercoagulable state   [x] Anemic Gastrointestinal:  [] Diarrhea   [] Vomiting  [] Gastroesophageal reflux/heartburn    [] Difficulty swallowing. Genitourinary:  [] Chronic kidney disease   [] Difficult urination  [] Anuric   [] Blood in urine Skin:  [] Rashes   [] Ulcers  Psychological:  [] History of anxiety   [x]  History of major depression  []  Memory Difficulties     Objective:   Physical Exam  BP 113/71 (BP Location: Right Arm)   Pulse 78   Resp 16   Ht 5\' 3"  (1.6 m)   Wt 107 lb (48.5 kg)   BMI 18.95 kg/m   Gen: WD/WN, NAD,  Head: [place]/AT, No temporalis wasting.  Ear/Nose/Throat: Hearing grossly intact, nares w/o erythema or drainage Eyes: PER, EOMI, sclera nonicteric.  Neck:  Supple, no masses.  No JVD.  Pulmonary:  Good air movement, no use of accessory muscles.  Cardiac: RRR Vascular:  Vessel Right Left  Radial Palpable Palpable   Gastrointestinal: soft, non-distended. No guarding/no peritoneal signs.  Musculoskeletal: M/S 5/5 throughout.  No deformity or atrophy.  Neurologic: Pain and light touch intact in extremities.  Symmetrical.  Speech is fluent. Motor exam as listed above. Psychiatric: Judgment intact, Mood & affect appropriate for pt's clinical situation. Dermatologic: No Venous rashes. No Ulcers Noted.  No changes consistent with cellulitis. Lymph : No Cervical lymphadenopathy, no lichenification or skin changes of chronic lymphedema.      Assessment & Plan:   1. ESRD on dialysis [facility]) The patient underwent an HDA today which revealed a flow volume of 815 with elevated velocities at the proximal graft of 627.  A residual lumen of 0.2 cm proximal to the stent.  Recommend:  The patient is experiencing increasing problems with their dialysis access.  Patient should have a left upper extremity shuntogram with the intention for intervention.  The intention for intervention is to restore appropriate flow and prevent thrombosis and possible loss of the access.  As well as improve the quality of dialysis therapy.  The risks, benefits and alternative therapies were reviewed in detail with  the patient and guardian.  All questions were answered.  The patient agrees to proceed with angio/intervention.      2. Gastroesophageal reflux disease, esophagitis presence not specified  Continue PPI as already ordered, this medication has been reviewed and there are no changes at this time.  Avoidence of caffeine and alcohol  Moderate elevation of the head of the bed   3. Essential hypertension Continue antihypertensive medications as already ordered, these medications have been reviewed and there are no changes at this time.    Current Outpatient Medications on File Prior to Visit  Medication Sig Dispense Refill  . albuterol (PROVENTIL) (2.5 MG/3ML) 0.083% nebulizer solution Take 2.5 mg by nebulization 4 (four) times daily as needed (breathing).    . ALPRAZolam (XANAX) 0.25 MG tablet Take 0.25 mg by mouth 2 (two) times daily.     Marland Kitchen aspirin EC 81 MG tablet Take 81 mg by mouth daily.    . chlorhexidine (PERIDEX) 0.12 % solution Use as directed 5 mLs in the mouth or throat 2 (two) times daily.     . clopidogrel (PLAVIX) 75 MG tablet Take 75 mg by mouth daily.    Marland Kitchen docusate sodium (COLACE) 100 MG capsule Take 100 mg by mouth daily.    Marland Kitchen epoetin alfa (EPOGEN,PROCRIT) 4000 UNIT/ML injection Inject 4,000 Units into the vein. Mon, wed, and Friday at dialysis    . guaiFENesin (ROBITUSSIN) 100 MG/5ML liquid Take 200 mg by mouth 3 (three) times daily as needed for cough.    . hydroxychloroquine (PLAQUENIL) 200 MG tablet Take 200 mg by mouth daily.    Marland Kitchen ipratropium (ATROVENT) 0.02 % nebulizer solution Take 250 mcg by nebulization 4 (four) times daily as needed (breathing).    Marland Kitchen lidocaine-prilocaine (EMLA) cream Apply 1 application topically as needed (for port access).     Marland Kitchen loratadine (CLARITIN) 10 MG tablet Take 10 mg by mouth daily as needed for allergies.    . midodrine (PROAMATINE) 10 MG tablet Take 10 mg by mouth 2 (two) times daily.     . mirtazapine (REMERON) 15 MG tablet 15 mg at  bedtime.     . multivitamin (RENA-VIT) TABS tablet Take 1 tablet by mouth  daily.    . pantoprazole (PROTONIX) 40 MG tablet Take 40 mg by mouth daily.    . phenytoin (DILANTIN) 100 MG ER capsule Take 100 mg by mouth 2 (two) times daily.     . sertraline (ZOLOFT) 100 MG tablet Take 200 mg by mouth daily.     . sevelamer carbonate (RENVELA) 800 MG tablet Take 800 mg by mouth 3 (three) times daily with meals. Take    . simvastatin (ZOCOR) 40 MG tablet Take 40 mg by mouth daily.     No current facility-administered medications on file prior to visit.     There are no Patient Instructions on file for this visit. No follow-ups on file.   Kris Hartmann, NP  This note was completed with Sales executive.  Any errors are purely unintentional.

## 2018-01-08 ENCOUNTER — Encounter (INDEPENDENT_AMBULATORY_CARE_PROVIDER_SITE_OTHER): Payer: Self-pay

## 2018-01-08 DIAGNOSIS — N2581 Secondary hyperparathyroidism of renal origin: Secondary | ICD-10-CM | POA: Diagnosis not present

## 2018-01-08 DIAGNOSIS — N186 End stage renal disease: Secondary | ICD-10-CM | POA: Diagnosis not present

## 2018-01-08 DIAGNOSIS — D631 Anemia in chronic kidney disease: Secondary | ICD-10-CM | POA: Diagnosis not present

## 2018-01-08 DIAGNOSIS — D509 Iron deficiency anemia, unspecified: Secondary | ICD-10-CM | POA: Diagnosis not present

## 2018-01-08 DIAGNOSIS — Z992 Dependence on renal dialysis: Secondary | ICD-10-CM | POA: Diagnosis not present

## 2018-01-11 DIAGNOSIS — D509 Iron deficiency anemia, unspecified: Secondary | ICD-10-CM | POA: Diagnosis not present

## 2018-01-11 DIAGNOSIS — N2581 Secondary hyperparathyroidism of renal origin: Secondary | ICD-10-CM | POA: Diagnosis not present

## 2018-01-11 DIAGNOSIS — N186 End stage renal disease: Secondary | ICD-10-CM | POA: Diagnosis not present

## 2018-01-11 DIAGNOSIS — D631 Anemia in chronic kidney disease: Secondary | ICD-10-CM | POA: Diagnosis not present

## 2018-01-11 DIAGNOSIS — Z992 Dependence on renal dialysis: Secondary | ICD-10-CM | POA: Diagnosis not present

## 2018-01-13 DIAGNOSIS — D631 Anemia in chronic kidney disease: Secondary | ICD-10-CM | POA: Diagnosis not present

## 2018-01-13 DIAGNOSIS — Z992 Dependence on renal dialysis: Secondary | ICD-10-CM | POA: Diagnosis not present

## 2018-01-13 DIAGNOSIS — N2581 Secondary hyperparathyroidism of renal origin: Secondary | ICD-10-CM | POA: Diagnosis not present

## 2018-01-13 DIAGNOSIS — D509 Iron deficiency anemia, unspecified: Secondary | ICD-10-CM | POA: Diagnosis not present

## 2018-01-13 DIAGNOSIS — N186 End stage renal disease: Secondary | ICD-10-CM | POA: Diagnosis not present

## 2018-01-14 DIAGNOSIS — N186 End stage renal disease: Secondary | ICD-10-CM | POA: Diagnosis not present

## 2018-01-14 DIAGNOSIS — Z992 Dependence on renal dialysis: Secondary | ICD-10-CM | POA: Diagnosis not present

## 2018-01-15 DIAGNOSIS — D509 Iron deficiency anemia, unspecified: Secondary | ICD-10-CM | POA: Diagnosis not present

## 2018-01-15 DIAGNOSIS — N186 End stage renal disease: Secondary | ICD-10-CM | POA: Diagnosis not present

## 2018-01-15 DIAGNOSIS — Z992 Dependence on renal dialysis: Secondary | ICD-10-CM | POA: Diagnosis not present

## 2018-01-15 DIAGNOSIS — D631 Anemia in chronic kidney disease: Secondary | ICD-10-CM | POA: Diagnosis not present

## 2018-01-15 DIAGNOSIS — N2581 Secondary hyperparathyroidism of renal origin: Secondary | ICD-10-CM | POA: Diagnosis not present

## 2018-01-18 DIAGNOSIS — N2581 Secondary hyperparathyroidism of renal origin: Secondary | ICD-10-CM | POA: Diagnosis not present

## 2018-01-18 DIAGNOSIS — Z992 Dependence on renal dialysis: Secondary | ICD-10-CM | POA: Diagnosis not present

## 2018-01-18 DIAGNOSIS — D631 Anemia in chronic kidney disease: Secondary | ICD-10-CM | POA: Diagnosis not present

## 2018-01-18 DIAGNOSIS — D509 Iron deficiency anemia, unspecified: Secondary | ICD-10-CM | POA: Diagnosis not present

## 2018-01-18 DIAGNOSIS — N186 End stage renal disease: Secondary | ICD-10-CM | POA: Diagnosis not present

## 2018-01-20 DIAGNOSIS — Z992 Dependence on renal dialysis: Secondary | ICD-10-CM | POA: Diagnosis not present

## 2018-01-20 DIAGNOSIS — N2581 Secondary hyperparathyroidism of renal origin: Secondary | ICD-10-CM | POA: Diagnosis not present

## 2018-01-20 DIAGNOSIS — N186 End stage renal disease: Secondary | ICD-10-CM | POA: Diagnosis not present

## 2018-01-20 DIAGNOSIS — D509 Iron deficiency anemia, unspecified: Secondary | ICD-10-CM | POA: Diagnosis not present

## 2018-01-20 DIAGNOSIS — D631 Anemia in chronic kidney disease: Secondary | ICD-10-CM | POA: Diagnosis not present

## 2018-01-21 ENCOUNTER — Encounter (INDEPENDENT_AMBULATORY_CARE_PROVIDER_SITE_OTHER): Payer: Medicare Other

## 2018-01-21 ENCOUNTER — Ambulatory Visit (INDEPENDENT_AMBULATORY_CARE_PROVIDER_SITE_OTHER): Payer: Medicare Other | Admitting: Vascular Surgery

## 2018-01-21 ENCOUNTER — Encounter

## 2018-01-22 DIAGNOSIS — D631 Anemia in chronic kidney disease: Secondary | ICD-10-CM | POA: Diagnosis not present

## 2018-01-22 DIAGNOSIS — Z992 Dependence on renal dialysis: Secondary | ICD-10-CM | POA: Diagnosis not present

## 2018-01-22 DIAGNOSIS — N186 End stage renal disease: Secondary | ICD-10-CM | POA: Diagnosis not present

## 2018-01-22 DIAGNOSIS — N2581 Secondary hyperparathyroidism of renal origin: Secondary | ICD-10-CM | POA: Diagnosis not present

## 2018-01-22 DIAGNOSIS — D509 Iron deficiency anemia, unspecified: Secondary | ICD-10-CM | POA: Diagnosis not present

## 2018-01-25 DIAGNOSIS — D509 Iron deficiency anemia, unspecified: Secondary | ICD-10-CM | POA: Diagnosis not present

## 2018-01-25 DIAGNOSIS — D631 Anemia in chronic kidney disease: Secondary | ICD-10-CM | POA: Diagnosis not present

## 2018-01-25 DIAGNOSIS — N186 End stage renal disease: Secondary | ICD-10-CM | POA: Diagnosis not present

## 2018-01-25 DIAGNOSIS — Z992 Dependence on renal dialysis: Secondary | ICD-10-CM | POA: Diagnosis not present

## 2018-01-25 DIAGNOSIS — N2581 Secondary hyperparathyroidism of renal origin: Secondary | ICD-10-CM | POA: Diagnosis not present

## 2018-01-27 ENCOUNTER — Other Ambulatory Visit (INDEPENDENT_AMBULATORY_CARE_PROVIDER_SITE_OTHER): Payer: Self-pay | Admitting: Vascular Surgery

## 2018-01-27 DIAGNOSIS — N186 End stage renal disease: Secondary | ICD-10-CM | POA: Diagnosis not present

## 2018-01-27 DIAGNOSIS — D509 Iron deficiency anemia, unspecified: Secondary | ICD-10-CM | POA: Diagnosis not present

## 2018-01-27 DIAGNOSIS — D631 Anemia in chronic kidney disease: Secondary | ICD-10-CM | POA: Diagnosis not present

## 2018-01-27 DIAGNOSIS — Z992 Dependence on renal dialysis: Secondary | ICD-10-CM | POA: Diagnosis not present

## 2018-01-27 DIAGNOSIS — N2581 Secondary hyperparathyroidism of renal origin: Secondary | ICD-10-CM | POA: Diagnosis not present

## 2018-01-27 MED ORDER — CEFAZOLIN SODIUM-DEXTROSE 1-4 GM/50ML-% IV SOLN
1.0000 g | Freq: Once | INTRAVENOUS | Status: AC
  Administered 2018-01-28: 1 g via INTRAVENOUS

## 2018-01-28 ENCOUNTER — Other Ambulatory Visit: Payer: Self-pay

## 2018-01-28 ENCOUNTER — Ambulatory Visit
Admission: RE | Admit: 2018-01-28 | Discharge: 2018-01-28 | Disposition: A | Discharge status: Home Health | Payer: Medicare Other | Source: Ambulatory Visit | Attending: Vascular Surgery | Admitting: Vascular Surgery

## 2018-01-28 ENCOUNTER — Encounter
Admission: RE | Disposition: A | Discharge status: Home Health | Payer: Self-pay | Source: Ambulatory Visit | Attending: Vascular Surgery

## 2018-01-28 DIAGNOSIS — Z820 Family history of epilepsy and other diseases of the nervous system: Secondary | ICD-10-CM | POA: Insufficient documentation

## 2018-01-28 DIAGNOSIS — Z9109 Other allergy status, other than to drugs and biological substances: Secondary | ICD-10-CM | POA: Diagnosis not present

## 2018-01-28 DIAGNOSIS — Z8619 Personal history of other infectious and parasitic diseases: Secondary | ICD-10-CM | POA: Diagnosis not present

## 2018-01-28 DIAGNOSIS — K219 Gastro-esophageal reflux disease without esophagitis: Secondary | ICD-10-CM | POA: Diagnosis not present

## 2018-01-28 DIAGNOSIS — Y832 Surgical operation with anastomosis, bypass or graft as the cause of abnormal reaction of the patient, or of later complication, without mention of misadventure at the time of the procedure: Secondary | ICD-10-CM | POA: Diagnosis not present

## 2018-01-28 DIAGNOSIS — Z992 Dependence on renal dialysis: Secondary | ICD-10-CM | POA: Diagnosis not present

## 2018-01-28 DIAGNOSIS — F17210 Nicotine dependence, cigarettes, uncomplicated: Secondary | ICD-10-CM | POA: Insufficient documentation

## 2018-01-28 DIAGNOSIS — N186 End stage renal disease: Secondary | ICD-10-CM

## 2018-01-28 DIAGNOSIS — Z86718 Personal history of other venous thrombosis and embolism: Secondary | ICD-10-CM | POA: Insufficient documentation

## 2018-01-28 DIAGNOSIS — Z7982 Long term (current) use of aspirin: Secondary | ICD-10-CM | POA: Diagnosis not present

## 2018-01-28 DIAGNOSIS — Z7902 Long term (current) use of antithrombotics/antiplatelets: Secondary | ICD-10-CM | POA: Insufficient documentation

## 2018-01-28 DIAGNOSIS — Z79899 Other long term (current) drug therapy: Secondary | ICD-10-CM | POA: Insufficient documentation

## 2018-01-28 DIAGNOSIS — M329 Systemic lupus erythematosus, unspecified: Secondary | ICD-10-CM | POA: Diagnosis not present

## 2018-01-28 DIAGNOSIS — I120 Hypertensive chronic kidney disease with stage 5 chronic kidney disease or end stage renal disease: Secondary | ICD-10-CM | POA: Diagnosis not present

## 2018-01-28 DIAGNOSIS — T82858A Stenosis of vascular prosthetic devices, implants and grafts, initial encounter: Secondary | ICD-10-CM | POA: Insufficient documentation

## 2018-01-28 DIAGNOSIS — Z9889 Other specified postprocedural states: Secondary | ICD-10-CM | POA: Insufficient documentation

## 2018-01-28 DIAGNOSIS — Z832 Family history of diseases of the blood and blood-forming organs and certain disorders involving the immune mechanism: Secondary | ICD-10-CM | POA: Insufficient documentation

## 2018-01-28 HISTORY — PX: A/V SHUNTOGRAM: CATH118297

## 2018-01-28 LAB — POTASSIUM (ARMC VASCULAR LAB ONLY): POTASSIUM (ARMC VASCULAR LAB): 3.8 (ref 3.5–5.1)

## 2018-01-28 LAB — HCG, QUANTITATIVE, PREGNANCY: hCG, Beta Chain, Quant, S: 1 m[IU]/mL (ref <5)

## 2018-01-28 SURGERY — A/V SHUNTOGRAM
Anesthesia: Moderate Sedation | Laterality: Left

## 2018-01-28 MED ORDER — LIDOCAINE-EPINEPHRINE (PF) 1 %-1:200000 IJ SOLN
INTRAMUSCULAR | Status: AC
  Filled 2018-01-28: qty 30

## 2018-01-28 MED ORDER — CEFAZOLIN SODIUM-DEXTROSE 1-4 GM/50ML-% IV SOLN
INTRAVENOUS | Status: AC
  Administered 2018-01-28: 1 g via INTRAVENOUS
  Filled 2018-01-28: qty 50

## 2018-01-28 MED ORDER — MIDAZOLAM HCL 2 MG/2ML IJ SOLN
INTRAMUSCULAR | Status: DC | PRN
  Administered 2018-01-28: 2 mg via INTRAVENOUS
  Administered 2018-01-28: 1 mg via INTRAVENOUS

## 2018-01-28 MED ORDER — FENTANYL CITRATE (PF) 100 MCG/2ML IJ SOLN
INTRAMUSCULAR | Status: DC | PRN
  Administered 2018-01-28 (×2): 50 ug via INTRAVENOUS

## 2018-01-28 MED ORDER — HEPARIN SODIUM (PORCINE) 1000 UNIT/ML IJ SOLN
INTRAMUSCULAR | Status: DC | PRN
  Administered 2018-01-28: 3000 [IU] via INTRAVENOUS

## 2018-01-28 MED ORDER — HYDROMORPHONE HCL 1 MG/ML IJ SOLN: 1.0000 mg | Freq: Once | INTRAMUSCULAR | Status: DC | PRN

## 2018-01-28 MED ORDER — FENTANYL CITRATE (PF) 100 MCG/2ML IJ SOLN
INTRAMUSCULAR | Status: AC
  Filled 2018-01-28: qty 2

## 2018-01-28 MED ORDER — SODIUM CHLORIDE 0.9 % IV SOLN
INTRAVENOUS | Status: DC
  Administered 2018-01-28: 09:00:00 via INTRAVENOUS

## 2018-01-28 MED ORDER — HEPARIN SODIUM (PORCINE) 1000 UNIT/ML IJ SOLN
INTRAMUSCULAR | Status: AC
  Filled 2018-01-28: qty 1

## 2018-01-28 MED ORDER — IOPAMIDOL (ISOVUE-300) INJECTION 61%
INTRAVENOUS | Status: DC | PRN
  Administered 2018-01-28: 15 mL via INTRAVENOUS

## 2018-01-28 MED ORDER — FAMOTIDINE 20 MG PO TABS: 40.0000 mg | ORAL_TABLET | ORAL | Status: DC | PRN

## 2018-01-28 MED ORDER — ONDANSETRON HCL 4 MG/2ML IJ SOLN: 4.0000 mg | Freq: Four times a day (QID) | INTRAMUSCULAR | Status: DC | PRN

## 2018-01-28 MED ORDER — MIDAZOLAM HCL 5 MG/5ML IJ SOLN
INTRAMUSCULAR | Status: AC
  Filled 2018-01-28: qty 5

## 2018-01-28 MED ORDER — METHYLPREDNISOLONE SODIUM SUCC 125 MG IJ SOLR: 125.0000 mg | INTRAMUSCULAR | Status: DC | PRN

## 2018-01-28 MED ORDER — HEPARIN (PORCINE) IN NACL 1000-0.9 UT/500ML-% IV SOLN
INTRAVENOUS | Status: AC
  Filled 2018-01-28: qty 1000

## 2018-01-28 SURGICAL SUPPLY — 9 items
BALLN LUTONIX DCB 6X100X130 (BALLOONS) ×2
BALLOON LUTONIX DCB 6X100X130 (BALLOONS) IMPLANT
CANNULA 5F STIFF (CANNULA) ×1 IMPLANT
DEVICE PRESTO INFLATION (MISCELLANEOUS) ×1 IMPLANT
DRAPE BRACHIAL (DRAPES) ×1 IMPLANT
PACK ANGIOGRAPHY (CUSTOM PROCEDURE TRAY) ×2 IMPLANT
SHEATH BRITE TIP 6FRX5.5 (SHEATH) ×1 IMPLANT
SUT MNCRL AB 4-0 PS2 18 (SUTURE) ×1 IMPLANT
WIRE MAGIC TOR.035 180C (WIRE) ×1 IMPLANT

## 2018-01-28 NOTE — Op Note (Signed)
[facility]    OPERATIVE NOTE   PROCEDURE: 1.  Left arm hero graft cannulation under ultrasound guidance 2.  Left arm shuntogram 3.  Percutaneous transluminal angioplasty of the perianastomotic portion of the graft and the axillary artery with 6 mm diameter 10 cm length Lutonix drug-coated angioplasty balloon  PRE-OPERATIVE DIAGNOSIS: 1. ESRD 2. Malfunctioning left arm hero graft based off of the axillary artery  POST-OPERATIVE DIAGNOSIS: same as above   SURGEON: Leotis Pain, MD  ANESTHESIA: local with MCS  ESTIMATED BLOOD LOSS: 3 cc  FINDING(S): 1. Multiple previously placed stents in the graft which were patent without significant stenosis.  About 3 to 4 cm into the graft before any of the previously placed stents there was about an 80% stenosis within the graft.  Just into the axillary artery proximal to the anastomosis was about a 65 to 70% stenosis.  The central portion of the hero graft was widely patent.  SPECIMEN(S):  None  CONTRAST: 15 cc  FLUORO TIME: 0.9 minutes  MODERATE CONSCIOUS SEDATION TIME:  Approximately 15 minutes using 3 mg of Versed and 100 Mcg of Fentanyl  INDICATIONS: [name] is a 42 y.o. female who presents with malfunctioning left arm hero arteriovenous graft.  The patient is scheduled for left arm shuntogram.  The patient is aware the risks include but are not limited to: bleeding, infection, thrombosis of the cannulated access, and possible anaphylactic reaction to the contrast.  The patient is aware of the risks of the procedure and elects to proceed forward.  DESCRIPTION: After full informed written consent was obtained, the patient was brought back to the angiography suite and placed supine upon the angiography table.  The patient was connected to monitoring equipment. Moderate conscious sedation was administered during a face to face encounter throughout the procedure with my supervision of the RN administering  medicines and monitoring the patient's vital signs, pulse oximetry, telemetry and mental status throughout from the start of the procedure until the patient was taken to the recovery room. The left arm was prepped and draped in the standard fashion for a percutaneous access intervention.  Under ultrasound guidance, the left arm hero graft was cannulated with a micropuncture needle under direct ultrasound retrograde fashion in the mid PTFE portion of the graft guidance were it was patent and a permanent image was performed.  The microwire was advanced into the graft and the needle was exchanged for the a microsheath.  I then upsized to a 6 Fr Sheath and imaging was performed.  Hand injections were completed to image the access including the central venous system. This demonstrated multiple previously placed stents in the graft which were patent without significant stenosis.  About 3 to 4 cm into the graft before any of the previously placed stents there was about an 80% stenosis within the graft.  Just into the axillary artery proximal to the anastomosis was about a 65 to 70% stenosis.  The central portion of the hero graft was widely patent..  Based on the images, this patient will need intervention to the stenoses. I then gave the patient 3000 units of intravenous heparin.  I then crossed the stenosis with a Magic Tourqe wire.  Based on the imaging, a 6 mm x 10 cm Lutonix drug-coated angioplasty balloon was selected.  The balloon was centered around the stenoses in a way that would treat both the endograft stenosis and the axillary arteries single balloon and inflated to 14 ATM for 1 minute(s).  On completion imaging, a 25 to 30% residual stenosis was present in the axillary artery and about a 10% residual stenosis was present in the graft.     Based on the completion imaging, no further intervention is necessary.  The wire and balloon were removed from the sheath.  A 4-0 Monocryl purse-string suture was sewn  around the sheath.  The sheath was removed while tying down the suture.  A sterile bandage was applied to the puncture site.  COMPLICATIONS: None  CONDITION: Stable   Leotis Pain  01/28/2018 10:38 AM    This note was created with Dragon Medical transcription system. Any errors in dictation are purely unintentional.

## 2018-01-28 NOTE — H&P (Signed)
[facility] History & Physical Update  The patient was interviewed and re-examined.  The patient's previous History and Physical has been reviewed and is unchanged.  There is no change in the plan of care. We plan to proceed with the scheduled procedure.  Leotis Pain, MD  01/28/2018, 9:57 AM

## 2018-01-29 DIAGNOSIS — N186 End stage renal disease: Secondary | ICD-10-CM | POA: Diagnosis not present

## 2018-01-29 DIAGNOSIS — Z992 Dependence on renal dialysis: Secondary | ICD-10-CM | POA: Diagnosis not present

## 2018-01-29 DIAGNOSIS — D631 Anemia in chronic kidney disease: Secondary | ICD-10-CM | POA: Diagnosis not present

## 2018-01-29 DIAGNOSIS — D509 Iron deficiency anemia, unspecified: Secondary | ICD-10-CM | POA: Diagnosis not present

## 2018-01-29 DIAGNOSIS — N2581 Secondary hyperparathyroidism of renal origin: Secondary | ICD-10-CM | POA: Diagnosis not present

## 2018-02-01 DIAGNOSIS — N186 End stage renal disease: Secondary | ICD-10-CM | POA: Diagnosis not present

## 2018-02-01 DIAGNOSIS — N2581 Secondary hyperparathyroidism of renal origin: Secondary | ICD-10-CM | POA: Diagnosis not present

## 2018-02-01 DIAGNOSIS — Z992 Dependence on renal dialysis: Secondary | ICD-10-CM | POA: Diagnosis not present

## 2018-02-01 DIAGNOSIS — D631 Anemia in chronic kidney disease: Secondary | ICD-10-CM | POA: Diagnosis not present

## 2018-02-01 DIAGNOSIS — D509 Iron deficiency anemia, unspecified: Secondary | ICD-10-CM | POA: Diagnosis not present

## 2018-02-03 DIAGNOSIS — N186 End stage renal disease: Secondary | ICD-10-CM | POA: Diagnosis not present

## 2018-02-03 DIAGNOSIS — N2581 Secondary hyperparathyroidism of renal origin: Secondary | ICD-10-CM | POA: Diagnosis not present

## 2018-02-03 DIAGNOSIS — D509 Iron deficiency anemia, unspecified: Secondary | ICD-10-CM | POA: Diagnosis not present

## 2018-02-03 DIAGNOSIS — Z992 Dependence on renal dialysis: Secondary | ICD-10-CM | POA: Diagnosis not present

## 2018-02-03 DIAGNOSIS — D631 Anemia in chronic kidney disease: Secondary | ICD-10-CM | POA: Diagnosis not present

## 2018-02-05 DIAGNOSIS — D509 Iron deficiency anemia, unspecified: Secondary | ICD-10-CM | POA: Diagnosis not present

## 2018-02-05 DIAGNOSIS — Z992 Dependence on renal dialysis: Secondary | ICD-10-CM | POA: Diagnosis not present

## 2018-02-05 DIAGNOSIS — N2581 Secondary hyperparathyroidism of renal origin: Secondary | ICD-10-CM | POA: Diagnosis not present

## 2018-02-05 DIAGNOSIS — N186 End stage renal disease: Secondary | ICD-10-CM | POA: Diagnosis not present

## 2018-02-05 DIAGNOSIS — D631 Anemia in chronic kidney disease: Secondary | ICD-10-CM | POA: Diagnosis not present

## 2018-02-08 DIAGNOSIS — N186 End stage renal disease: Secondary | ICD-10-CM | POA: Diagnosis not present

## 2018-02-08 DIAGNOSIS — Z992 Dependence on renal dialysis: Secondary | ICD-10-CM | POA: Diagnosis not present

## 2018-02-08 DIAGNOSIS — N2581 Secondary hyperparathyroidism of renal origin: Secondary | ICD-10-CM | POA: Diagnosis not present

## 2018-02-08 DIAGNOSIS — D509 Iron deficiency anemia, unspecified: Secondary | ICD-10-CM | POA: Diagnosis not present

## 2018-02-08 DIAGNOSIS — D631 Anemia in chronic kidney disease: Secondary | ICD-10-CM | POA: Diagnosis not present

## 2018-02-10 DIAGNOSIS — D631 Anemia in chronic kidney disease: Secondary | ICD-10-CM | POA: Diagnosis not present

## 2018-02-10 DIAGNOSIS — N186 End stage renal disease: Secondary | ICD-10-CM | POA: Diagnosis not present

## 2018-02-10 DIAGNOSIS — Z992 Dependence on renal dialysis: Secondary | ICD-10-CM | POA: Diagnosis not present

## 2018-02-10 DIAGNOSIS — N2581 Secondary hyperparathyroidism of renal origin: Secondary | ICD-10-CM | POA: Diagnosis not present

## 2018-02-10 DIAGNOSIS — D509 Iron deficiency anemia, unspecified: Secondary | ICD-10-CM | POA: Diagnosis not present

## 2018-02-12 DIAGNOSIS — D509 Iron deficiency anemia, unspecified: Secondary | ICD-10-CM | POA: Diagnosis not present

## 2018-02-12 DIAGNOSIS — N186 End stage renal disease: Secondary | ICD-10-CM | POA: Diagnosis not present

## 2018-02-12 DIAGNOSIS — N2581 Secondary hyperparathyroidism of renal origin: Secondary | ICD-10-CM | POA: Diagnosis not present

## 2018-02-12 DIAGNOSIS — D631 Anemia in chronic kidney disease: Secondary | ICD-10-CM | POA: Diagnosis not present

## 2018-02-12 DIAGNOSIS — Z992 Dependence on renal dialysis: Secondary | ICD-10-CM | POA: Diagnosis not present

## 2018-02-15 DIAGNOSIS — D509 Iron deficiency anemia, unspecified: Secondary | ICD-10-CM | POA: Diagnosis not present

## 2018-02-15 DIAGNOSIS — N186 End stage renal disease: Secondary | ICD-10-CM | POA: Diagnosis not present

## 2018-02-15 DIAGNOSIS — D631 Anemia in chronic kidney disease: Secondary | ICD-10-CM | POA: Diagnosis not present

## 2018-02-15 DIAGNOSIS — Z992 Dependence on renal dialysis: Secondary | ICD-10-CM | POA: Diagnosis not present

## 2018-02-15 DIAGNOSIS — N2581 Secondary hyperparathyroidism of renal origin: Secondary | ICD-10-CM | POA: Diagnosis not present

## 2018-02-17 DIAGNOSIS — D509 Iron deficiency anemia, unspecified: Secondary | ICD-10-CM | POA: Diagnosis not present

## 2018-02-17 DIAGNOSIS — D631 Anemia in chronic kidney disease: Secondary | ICD-10-CM | POA: Diagnosis not present

## 2018-02-17 DIAGNOSIS — N186 End stage renal disease: Secondary | ICD-10-CM | POA: Diagnosis not present

## 2018-02-17 DIAGNOSIS — N2581 Secondary hyperparathyroidism of renal origin: Secondary | ICD-10-CM | POA: Diagnosis not present

## 2018-02-17 DIAGNOSIS — Z992 Dependence on renal dialysis: Secondary | ICD-10-CM | POA: Diagnosis not present

## 2018-02-19 DIAGNOSIS — N2581 Secondary hyperparathyroidism of renal origin: Secondary | ICD-10-CM | POA: Diagnosis not present

## 2018-02-19 DIAGNOSIS — N186 End stage renal disease: Secondary | ICD-10-CM | POA: Diagnosis not present

## 2018-02-19 DIAGNOSIS — D631 Anemia in chronic kidney disease: Secondary | ICD-10-CM | POA: Diagnosis not present

## 2018-02-19 DIAGNOSIS — D509 Iron deficiency anemia, unspecified: Secondary | ICD-10-CM | POA: Diagnosis not present

## 2018-02-19 DIAGNOSIS — Z992 Dependence on renal dialysis: Secondary | ICD-10-CM | POA: Diagnosis not present

## 2018-02-22 DIAGNOSIS — D509 Iron deficiency anemia, unspecified: Secondary | ICD-10-CM | POA: Diagnosis not present

## 2018-02-22 DIAGNOSIS — Z992 Dependence on renal dialysis: Secondary | ICD-10-CM | POA: Diagnosis not present

## 2018-02-22 DIAGNOSIS — N186 End stage renal disease: Secondary | ICD-10-CM | POA: Diagnosis not present

## 2018-02-22 DIAGNOSIS — D631 Anemia in chronic kidney disease: Secondary | ICD-10-CM | POA: Diagnosis not present

## 2018-02-22 DIAGNOSIS — N2581 Secondary hyperparathyroidism of renal origin: Secondary | ICD-10-CM | POA: Diagnosis not present

## 2018-02-24 DIAGNOSIS — D509 Iron deficiency anemia, unspecified: Secondary | ICD-10-CM | POA: Diagnosis not present

## 2018-02-24 DIAGNOSIS — N186 End stage renal disease: Secondary | ICD-10-CM | POA: Diagnosis not present

## 2018-02-24 DIAGNOSIS — N2581 Secondary hyperparathyroidism of renal origin: Secondary | ICD-10-CM | POA: Diagnosis not present

## 2018-02-24 DIAGNOSIS — D631 Anemia in chronic kidney disease: Secondary | ICD-10-CM | POA: Diagnosis not present

## 2018-02-24 DIAGNOSIS — Z992 Dependence on renal dialysis: Secondary | ICD-10-CM | POA: Diagnosis not present

## 2018-02-26 DIAGNOSIS — D509 Iron deficiency anemia, unspecified: Secondary | ICD-10-CM | POA: Diagnosis not present

## 2018-02-26 DIAGNOSIS — N186 End stage renal disease: Secondary | ICD-10-CM | POA: Diagnosis not present

## 2018-02-26 DIAGNOSIS — N2581 Secondary hyperparathyroidism of renal origin: Secondary | ICD-10-CM | POA: Diagnosis not present

## 2018-02-26 DIAGNOSIS — D631 Anemia in chronic kidney disease: Secondary | ICD-10-CM | POA: Diagnosis not present

## 2018-02-26 DIAGNOSIS — Z992 Dependence on renal dialysis: Secondary | ICD-10-CM | POA: Diagnosis not present

## 2018-03-01 DIAGNOSIS — N2581 Secondary hyperparathyroidism of renal origin: Secondary | ICD-10-CM | POA: Diagnosis not present

## 2018-03-01 DIAGNOSIS — N186 End stage renal disease: Secondary | ICD-10-CM | POA: Diagnosis not present

## 2018-03-01 DIAGNOSIS — Z992 Dependence on renal dialysis: Secondary | ICD-10-CM | POA: Diagnosis not present

## 2018-03-01 DIAGNOSIS — D631 Anemia in chronic kidney disease: Secondary | ICD-10-CM | POA: Diagnosis not present

## 2018-03-01 DIAGNOSIS — D509 Iron deficiency anemia, unspecified: Secondary | ICD-10-CM | POA: Diagnosis not present

## 2018-03-02 DIAGNOSIS — J449 Chronic obstructive pulmonary disease, unspecified: Secondary | ICD-10-CM | POA: Diagnosis not present

## 2018-03-02 DIAGNOSIS — R569 Unspecified convulsions: Secondary | ICD-10-CM | POA: Diagnosis not present

## 2018-03-02 DIAGNOSIS — D179 Benign lipomatous neoplasm, unspecified: Secondary | ICD-10-CM | POA: Diagnosis not present

## 2018-03-02 DIAGNOSIS — I120 Hypertensive chronic kidney disease with stage 5 chronic kidney disease or end stage renal disease: Secondary | ICD-10-CM | POA: Diagnosis not present

## 2018-03-03 DIAGNOSIS — Z992 Dependence on renal dialysis: Secondary | ICD-10-CM | POA: Diagnosis not present

## 2018-03-03 DIAGNOSIS — D509 Iron deficiency anemia, unspecified: Secondary | ICD-10-CM | POA: Diagnosis not present

## 2018-03-03 DIAGNOSIS — N2581 Secondary hyperparathyroidism of renal origin: Secondary | ICD-10-CM | POA: Diagnosis not present

## 2018-03-03 DIAGNOSIS — N186 End stage renal disease: Secondary | ICD-10-CM | POA: Diagnosis not present

## 2018-03-03 DIAGNOSIS — D631 Anemia in chronic kidney disease: Secondary | ICD-10-CM | POA: Diagnosis not present

## 2018-03-05 DIAGNOSIS — D631 Anemia in chronic kidney disease: Secondary | ICD-10-CM | POA: Diagnosis not present

## 2018-03-05 DIAGNOSIS — Z992 Dependence on renal dialysis: Secondary | ICD-10-CM | POA: Diagnosis not present

## 2018-03-05 DIAGNOSIS — N2581 Secondary hyperparathyroidism of renal origin: Secondary | ICD-10-CM | POA: Diagnosis not present

## 2018-03-05 DIAGNOSIS — D509 Iron deficiency anemia, unspecified: Secondary | ICD-10-CM | POA: Diagnosis not present

## 2018-03-05 DIAGNOSIS — N186 End stage renal disease: Secondary | ICD-10-CM | POA: Diagnosis not present

## 2018-03-07 DIAGNOSIS — D631 Anemia in chronic kidney disease: Secondary | ICD-10-CM | POA: Diagnosis not present

## 2018-03-07 DIAGNOSIS — Z992 Dependence on renal dialysis: Secondary | ICD-10-CM | POA: Diagnosis not present

## 2018-03-07 DIAGNOSIS — N186 End stage renal disease: Secondary | ICD-10-CM | POA: Diagnosis not present

## 2018-03-07 DIAGNOSIS — D509 Iron deficiency anemia, unspecified: Secondary | ICD-10-CM | POA: Diagnosis not present

## 2018-03-07 DIAGNOSIS — N2581 Secondary hyperparathyroidism of renal origin: Secondary | ICD-10-CM | POA: Diagnosis not present

## 2018-03-09 DIAGNOSIS — N2581 Secondary hyperparathyroidism of renal origin: Secondary | ICD-10-CM | POA: Diagnosis not present

## 2018-03-09 DIAGNOSIS — N186 End stage renal disease: Secondary | ICD-10-CM | POA: Diagnosis not present

## 2018-03-09 DIAGNOSIS — Z992 Dependence on renal dialysis: Secondary | ICD-10-CM | POA: Diagnosis not present

## 2018-03-09 DIAGNOSIS — D509 Iron deficiency anemia, unspecified: Secondary | ICD-10-CM | POA: Diagnosis not present

## 2018-03-09 DIAGNOSIS — D631 Anemia in chronic kidney disease: Secondary | ICD-10-CM | POA: Diagnosis not present

## 2018-03-11 ENCOUNTER — Encounter (INDEPENDENT_AMBULATORY_CARE_PROVIDER_SITE_OTHER): Payer: Self-pay | Admitting: Nurse Practitioner

## 2018-03-11 ENCOUNTER — Ambulatory Visit (INDEPENDENT_AMBULATORY_CARE_PROVIDER_SITE_OTHER): Payer: Medicare Other

## 2018-03-11 ENCOUNTER — Ambulatory Visit (INDEPENDENT_AMBULATORY_CARE_PROVIDER_SITE_OTHER): Payer: Medicare Other | Admitting: Nurse Practitioner

## 2018-03-11 ENCOUNTER — Other Ambulatory Visit (INDEPENDENT_AMBULATORY_CARE_PROVIDER_SITE_OTHER): Payer: Self-pay | Admitting: Vascular Surgery

## 2018-03-11 VITALS — BP 113/73 | HR 68 | Resp 12 | Wt 110.2 lb

## 2018-03-11 DIAGNOSIS — K219 Gastro-esophageal reflux disease without esophagitis: Secondary | ICD-10-CM | POA: Diagnosis not present

## 2018-03-11 DIAGNOSIS — N186 End stage renal disease: Secondary | ICD-10-CM

## 2018-03-11 DIAGNOSIS — I120 Hypertensive chronic kidney disease with stage 5 chronic kidney disease or end stage renal disease: Secondary | ICD-10-CM

## 2018-03-11 DIAGNOSIS — Z9862 Peripheral vascular angioplasty status: Secondary | ICD-10-CM | POA: Diagnosis not present

## 2018-03-11 DIAGNOSIS — F17210 Nicotine dependence, cigarettes, uncomplicated: Secondary | ICD-10-CM

## 2018-03-11 DIAGNOSIS — I10 Essential (primary) hypertension: Secondary | ICD-10-CM

## 2018-03-11 DIAGNOSIS — Z992 Dependence on renal dialysis: Secondary | ICD-10-CM | POA: Diagnosis not present

## 2018-03-11 NOTE — Progress Notes (Signed)
Subjective:    Patient ID: [name], female    DOB: 28-Sep-1975, 42 y.o.   MRN: [id number] Chief Complaint  Patient presents with  . Follow-up    HPI  [name] is a 42 y.o. female that is following up post intervention on 01/28/2018.  She is also present with her guardian.  The guardian reports that there are no issues with dialysis currently.  She states that the dialysis center will generally contact her after having problems with dialysis access but in this instance they have not had any issues.  The patient denies any hand pain consistent with steal syndrome.  She denies any fever, chills, nausea, vomiting or diarrhea while on dialysis.  She denies any chest pain or shortness of breath.  She underwent a hemodialysis access today which reveals a flow volume of 697.  There is elevated velocity at the arterial anastomosis of 723.  Her previous study done on 01/07/2018 had a flow volume of 815.  The previous arterial anastomosis velocities were 358.  Past Medical History:  Diagnosis Date  . Anemia   . BV (bacterial vaginosis) 11/08/2013  . Depression   . Dialysis patient ([place])   . DVT (deep venous thrombosis) ([place])   . Hypertension   . Lupus ([place])   . Renal disorder   . Vaginal odor 11/08/2013    Past Surgical History:  Procedure Laterality Date  . A/V SHUNT INTERVENTION N/A 10/24/2016   Procedure: A/V SHUNT INTERVENTION;  Surgeon: Katha Cabal, MD;  Location: [facility];  Service: Cardiovascular;  Laterality: N/A;  . A/V SHUNTOGRAM Left 12/08/2017   Procedure: A/V SHUNTOGRAM;  Surgeon: Katha Cabal, MD;  Location: [facility];  Service: Cardiovascular;  Laterality: Left;  . A/V SHUNTOGRAM Left 01/28/2018   Procedure: A/V SHUNTOGRAM;  Surgeon: Algernon Huxley, MD;  Location: [facility];  Service: Cardiovascular;  Laterality: Left;  . AV FISTULA PLACEMENT    . DIALYSIS/PERMA CATHETER INSERTION  10/23/2016   Procedure:  DIALYSIS/PERMA CATHETER INSERTION;  Surgeon: Algernon Huxley, MD;  Location: [facility];  Service: Cardiovascular;;  . IR REMOVAL TUN CV CATH W/O FL  09/22/2017  . IR THROMBECTOMY AV FISTULA W/THROMBOLYSIS INC/SHUNT/IMG LEFT Left 09/04/2017  . IR US GUIDE VASC ACCESS LEFT  09/04/2017  . PERIPHERAL VASCULAR CATHETERIZATION Left 01/04/2015   Procedure: A/V Shuntogram/Fistulagram;  Surgeon: Algernon Huxley, MD;  Location: [facility];  Service: Cardiovascular;  Laterality: Left;  . PERIPHERAL VASCULAR CATHETERIZATION N/A 01/04/2015   Procedure: A/V Shunt Intervention;  Surgeon: Algernon Huxley, MD;  Location: [facility];  Service: Cardiovascular;  Laterality: N/A;  . PERIPHERAL VASCULAR CATHETERIZATION Left 04/13/2015   Procedure: A/V Shuntogram/Fistulagram;  Surgeon: Katha Cabal, MD;  Location: [facility];  Service: Cardiovascular;  Laterality: Left;  . PERIPHERAL VASCULAR CATHETERIZATION Left 04/13/2015   Procedure: A/V Shunt Intervention;  Surgeon: Katha Cabal, MD;  Location: [facility];  Service: Cardiovascular;  Laterality: Left;    Social History   Socioeconomic History  . Marital status: Single    Spouse name: Not on file  . Number of children: Not on file  . Years of education: Not on file  . Highest education level: Not on file  Occupational History  . Not on file  Social Needs  . Financial resource strain: Not on file  . Food insecurity:    Worry: Not on file    Inability:  Not on file  . Transportation needs:    Medical: Not on file    Non-medical: Not on file  Tobacco Use  . Smoking status: Current Every Day Smoker    Packs/day: 0.00    Years: 15.00    Pack years: 0.00    Types: Cigarettes  . Smokeless tobacco: Never Used  . Tobacco comment: smokes 2 cig daily  Substance and Sexual Activity  . Alcohol use: No  . Drug use: No  . Sexual activity: Not Currently    Birth control/protection: None  Lifestyle  . Physical  activity:    Days per week: Not on file    Minutes per session: Not on file  . Stress: Not on file  Relationships  . Social connections:    Talks on phone: Not on file    Gets together: Not on file    Attends religious service: Not on file    Active member of club or organization: Not on file    Attends meetings of clubs or organizations: Not on file    Relationship status: Not on file  . Intimate partner violence:    Fear of current or ex partner: Not on file    Emotionally abused: Not on file    Physically abused: Not on file    Forced sexual activity: Not on file  Other Topics Concern  . Not on file  Social History Narrative  . Not on file    Family History  Problem Relation Age of Onset  . Seizures Son   . Kidney disease Mother        on dialysis  . Lupus Mother     Allergies  Allergen Reactions  . Dust Mite Extract Other (See Comments)    sneezing     Review of Systems   Review of Systems: Negative Unless Checked Constitutional: [] Weight loss  [] Fever  [] Chills Cardiac: [] Chest pain   []  Atrial Fibrillation  [] Palpitations   [] Shortness of breath when laying flat   [] Shortness of breath with exertion. [] Shortness of breath at rest Vascular:  [] Pain in legs with walking   [] Pain in legs with standing [] Pain in legs when laying flat   [] Claudication    [] Pain in feet when laying flat    [] History of DVT   [] Phlebitis   [] Swelling in legs   [] Varicose veins   [] Non-healing ulcers Pulmonary:   [] Uses home oxygen   [] Productive cough   [] Hemoptysis   [] Wheeze  [] COPD   [x] Asthma Neurologic:  [] Dizziness   [] Seizures  [] Blackouts [] History of stroke   [] History of TIA  [] Aphasia   [] Temporary Blindness   [] Weakness or numbness in arm   [] Weakness or numbness in leg Musculoskeletal:   [x] Joint swelling   [x] Joint pain   [] Low back pain  []  History of Knee Replacement [] Arthritis [] back Surgeries  []  Spinal Stenosis    Hematologic:  [] Easy bruising  [] Easy bleeding    [] Hypercoagulable state   [] Anemic Gastrointestinal:  [] Diarrhea   [] Vomiting  [] Gastroesophageal reflux/heartburn   [] Difficulty swallowing. [] Abdominal pain Genitourinary:  [] Chronic kidney disease   [] Difficult urination  [] Anuric   [] Blood in urine [] Frequent urination  [] Burning with urination   [] Hematuria Skin:  [] Rashes   [] Ulcers [] Wounds Psychological:  [] History of anxiety   [x]  History of major depression  [x]  Memory Difficulties     Objective:   Physical Exam  BP 113/73 (BP Location: Right Arm, Patient Position: Sitting)   Pulse 68   Resp  12   Wt 110 lb 3.2 oz (50 kg)   BMI 19.52 kg/m   Gen: WD/WN, NAD Head: [place]/AT, No temporalis wasting.  Ear/Nose/Throat: Hearing grossly intact, nares w/o erythema or drainage Eyes: PER, EOMI, sclera nonicteric.  Neck: Supple, no masses.  No JVD.  Pulmonary:  Good air movement, no use of accessory muscles.  Cardiac: RRR Vascular:  Good thrill and bruit Vessel Right Left  Radial Palpable Palpable   Gastrointestinal: soft, non-distended. No guarding/no peritoneal signs.  Musculoskeletal: M/S 5/5 throughout.  No deformity or atrophy.  Neurologic: Pain and light touch intact in extremities.  Symmetrical.  Speech is fluent. Motor exam as listed above. Psychiatric: Judgment intact, Mood & affect appropriate for pt's clinical situation. Dermatologic: No Venous rashes. No Ulcers Noted.  No changes consistent with cellulitis. Lymph : No Cervical lymphadenopathy, no lichenification or skin changes of chronic lymphedema.      Assessment & Plan:   1. ESRD on dialysis [facility]) She underwent a hemodialysis access today which reveals a flow volume of 697.  There is elevated velocity at the arterial anastomosis of 723.  Her previous study done on 01/07/2018 had a flow volume of 815.  The previous arterial anastomosis velocities were 358.  Recommend:  The patient is experiencing increasing problems with their dialysis access.  Patient should  have a shuntogram of her left upper extremity hero graft with the intention for intervention.  The intention for intervention is to restore appropriate flow and prevent thrombosis and possible loss of the access.  As well as improve the quality of dialysis therapy.  The risks, benefits and alternative therapies were reviewed in detail with the patient.  All questions were answered.  The patient agrees to proceed with angio/intervention.      2. Gastroesophageal reflux disease, esophagitis presence not specified Continue PPI as already ordered, this medication has been reviewed and there are no changes at this time.  Avoidence of caffeine and alcohol  Moderate elevation of the head of the bed   3. Essential hypertension Continue antihypertensive medications as already ordered, these medications have been reviewed and there are no changes at this time.    Current Outpatient Medications on File Prior to Visit  Medication Sig Dispense Refill  . albuterol (PROVENTIL) (2.5 MG/3ML) 0.083% nebulizer solution Take 2.5 mg by nebulization 4 (four) times daily as needed (breathing).    . ALPRAZolam (XANAX) 0.25 MG tablet Take 0.25 mg by mouth 2 (two) times daily.     Marland Kitchen aspirin EC 81 MG tablet Take 81 mg by mouth daily.    . chlorhexidine (PERIDEX) 0.12 % solution Use as directed 5 mLs in the mouth or throat 2 (two) times daily.     . clopidogrel (PLAVIX) 75 MG tablet Take 75 mg by mouth daily.    Marland Kitchen docusate sodium (COLACE) 100 MG capsule Take 100 mg by mouth daily.    Marland Kitchen epoetin alfa (EPOGEN,PROCRIT) 4000 UNIT/ML injection Inject 4,000 Units into the vein. Mon, wed, and Friday at dialysis    . guaiFENesin (ROBITUSSIN) 100 MG/5ML liquid Take 200 mg by mouth 3 (three) times daily as needed for cough.    . hydroxychloroquine (PLAQUENIL) 200 MG tablet Take 200 mg by mouth daily.    Marland Kitchen ipratropium (ATROVENT) 0.02 % nebulizer solution Take 250 mcg by nebulization 4 (four) times daily as needed (breathing).     Marland Kitchen lidocaine-prilocaine (EMLA) cream Apply 1 application topically as needed (for port access).     Marland Kitchen loratadine (CLARITIN)  10 MG tablet Take 10 mg by mouth daily as needed for allergies.    . midodrine (PROAMATINE) 10 MG tablet Take 10 mg by mouth 2 (two) times daily.     . mirtazapine (REMERON) 15 MG tablet 15 mg at bedtime.     . multivitamin (RENA-VIT) TABS tablet Take 1 tablet by mouth daily.    . pantoprazole (PROTONIX) 40 MG tablet Take 40 mg by mouth daily.    . phenytoin (DILANTIN) 100 MG ER capsule Take 100 mg by mouth 2 (two) times daily.     . sertraline (ZOLOFT) 100 MG tablet Take 200 mg by mouth daily.     . sevelamer carbonate (RENVELA) 800 MG tablet Take 800 mg by mouth 3 (three) times daily with meals. Take    . simvastatin (ZOCOR) 40 MG tablet Take 40 mg by mouth daily.     No current facility-administered medications on file prior to visit.     There are no Patient Instructions on file for this visit. No follow-ups on file.   Kris Hartmann, NP  This note was completed with Sales executive.  Any errors are purely unintentional.

## 2018-03-12 DIAGNOSIS — Z992 Dependence on renal dialysis: Secondary | ICD-10-CM | POA: Diagnosis not present

## 2018-03-12 DIAGNOSIS — D509 Iron deficiency anemia, unspecified: Secondary | ICD-10-CM | POA: Diagnosis not present

## 2018-03-12 DIAGNOSIS — D631 Anemia in chronic kidney disease: Secondary | ICD-10-CM | POA: Diagnosis not present

## 2018-03-12 DIAGNOSIS — N2581 Secondary hyperparathyroidism of renal origin: Secondary | ICD-10-CM | POA: Diagnosis not present

## 2018-03-12 DIAGNOSIS — N186 End stage renal disease: Secondary | ICD-10-CM | POA: Diagnosis not present

## 2018-03-15 ENCOUNTER — Encounter (INDEPENDENT_AMBULATORY_CARE_PROVIDER_SITE_OTHER): Payer: Self-pay

## 2018-03-15 DIAGNOSIS — D631 Anemia in chronic kidney disease: Secondary | ICD-10-CM | POA: Diagnosis not present

## 2018-03-15 DIAGNOSIS — N186 End stage renal disease: Secondary | ICD-10-CM | POA: Diagnosis not present

## 2018-03-15 DIAGNOSIS — D509 Iron deficiency anemia, unspecified: Secondary | ICD-10-CM | POA: Diagnosis not present

## 2018-03-15 DIAGNOSIS — Z992 Dependence on renal dialysis: Secondary | ICD-10-CM | POA: Diagnosis not present

## 2018-03-15 DIAGNOSIS — N2581 Secondary hyperparathyroidism of renal origin: Secondary | ICD-10-CM | POA: Diagnosis not present

## 2018-03-17 DIAGNOSIS — D631 Anemia in chronic kidney disease: Secondary | ICD-10-CM | POA: Diagnosis not present

## 2018-03-17 DIAGNOSIS — N186 End stage renal disease: Secondary | ICD-10-CM | POA: Diagnosis not present

## 2018-03-17 DIAGNOSIS — N2581 Secondary hyperparathyroidism of renal origin: Secondary | ICD-10-CM | POA: Diagnosis not present

## 2018-03-17 DIAGNOSIS — Z992 Dependence on renal dialysis: Secondary | ICD-10-CM | POA: Diagnosis not present

## 2018-03-17 DIAGNOSIS — D509 Iron deficiency anemia, unspecified: Secondary | ICD-10-CM | POA: Diagnosis not present

## 2018-03-19 DIAGNOSIS — Z992 Dependence on renal dialysis: Secondary | ICD-10-CM | POA: Diagnosis not present

## 2018-03-19 DIAGNOSIS — N2581 Secondary hyperparathyroidism of renal origin: Secondary | ICD-10-CM | POA: Diagnosis not present

## 2018-03-19 DIAGNOSIS — N186 End stage renal disease: Secondary | ICD-10-CM | POA: Diagnosis not present

## 2018-03-19 DIAGNOSIS — D509 Iron deficiency anemia, unspecified: Secondary | ICD-10-CM | POA: Diagnosis not present

## 2018-03-19 DIAGNOSIS — D631 Anemia in chronic kidney disease: Secondary | ICD-10-CM | POA: Diagnosis not present

## 2018-03-22 DIAGNOSIS — N186 End stage renal disease: Secondary | ICD-10-CM | POA: Diagnosis not present

## 2018-03-22 DIAGNOSIS — N2581 Secondary hyperparathyroidism of renal origin: Secondary | ICD-10-CM | POA: Diagnosis not present

## 2018-03-22 DIAGNOSIS — Z992 Dependence on renal dialysis: Secondary | ICD-10-CM | POA: Diagnosis not present

## 2018-03-22 DIAGNOSIS — D631 Anemia in chronic kidney disease: Secondary | ICD-10-CM | POA: Diagnosis not present

## 2018-03-22 DIAGNOSIS — D509 Iron deficiency anemia, unspecified: Secondary | ICD-10-CM | POA: Diagnosis not present

## 2018-03-24 DIAGNOSIS — N186 End stage renal disease: Secondary | ICD-10-CM | POA: Diagnosis not present

## 2018-03-24 DIAGNOSIS — Z992 Dependence on renal dialysis: Secondary | ICD-10-CM | POA: Diagnosis not present

## 2018-03-24 DIAGNOSIS — D509 Iron deficiency anemia, unspecified: Secondary | ICD-10-CM | POA: Diagnosis not present

## 2018-03-24 DIAGNOSIS — D631 Anemia in chronic kidney disease: Secondary | ICD-10-CM | POA: Diagnosis not present

## 2018-03-24 DIAGNOSIS — N2581 Secondary hyperparathyroidism of renal origin: Secondary | ICD-10-CM | POA: Diagnosis not present

## 2018-03-26 DIAGNOSIS — N2581 Secondary hyperparathyroidism of renal origin: Secondary | ICD-10-CM | POA: Diagnosis not present

## 2018-03-26 DIAGNOSIS — D631 Anemia in chronic kidney disease: Secondary | ICD-10-CM | POA: Diagnosis not present

## 2018-03-26 DIAGNOSIS — Z992 Dependence on renal dialysis: Secondary | ICD-10-CM | POA: Diagnosis not present

## 2018-03-26 DIAGNOSIS — N186 End stage renal disease: Secondary | ICD-10-CM | POA: Diagnosis not present

## 2018-03-26 DIAGNOSIS — D509 Iron deficiency anemia, unspecified: Secondary | ICD-10-CM | POA: Diagnosis not present

## 2018-03-29 DIAGNOSIS — N186 End stage renal disease: Secondary | ICD-10-CM | POA: Diagnosis not present

## 2018-03-29 DIAGNOSIS — D509 Iron deficiency anemia, unspecified: Secondary | ICD-10-CM | POA: Diagnosis not present

## 2018-03-29 DIAGNOSIS — N2581 Secondary hyperparathyroidism of renal origin: Secondary | ICD-10-CM | POA: Diagnosis not present

## 2018-03-29 DIAGNOSIS — D631 Anemia in chronic kidney disease: Secondary | ICD-10-CM | POA: Diagnosis not present

## 2018-03-29 DIAGNOSIS — E119 Type 2 diabetes mellitus without complications: Secondary | ICD-10-CM | POA: Diagnosis not present

## 2018-03-29 DIAGNOSIS — Z992 Dependence on renal dialysis: Secondary | ICD-10-CM | POA: Diagnosis not present

## 2018-03-31 ENCOUNTER — Other Ambulatory Visit (INDEPENDENT_AMBULATORY_CARE_PROVIDER_SITE_OTHER): Payer: Self-pay | Admitting: Vascular Surgery

## 2018-03-31 DIAGNOSIS — N186 End stage renal disease: Secondary | ICD-10-CM | POA: Diagnosis not present

## 2018-03-31 DIAGNOSIS — D509 Iron deficiency anemia, unspecified: Secondary | ICD-10-CM | POA: Diagnosis not present

## 2018-03-31 DIAGNOSIS — N2581 Secondary hyperparathyroidism of renal origin: Secondary | ICD-10-CM | POA: Diagnosis not present

## 2018-03-31 DIAGNOSIS — Z992 Dependence on renal dialysis: Secondary | ICD-10-CM | POA: Diagnosis not present

## 2018-03-31 DIAGNOSIS — D631 Anemia in chronic kidney disease: Secondary | ICD-10-CM | POA: Diagnosis not present

## 2018-03-31 MED ORDER — CEFAZOLIN SODIUM-DEXTROSE 1-4 GM/50ML-% IV SOLN
1.0000 g | Freq: Once | INTRAVENOUS | Status: AC
  Administered 2018-04-01: 1 g via INTRAVENOUS

## 2018-04-01 ENCOUNTER — Encounter
Admission: RE | Disposition: A | Discharge status: Home / Self Care | Payer: Self-pay | Source: Home / Self Care | Attending: Vascular Surgery

## 2018-04-01 ENCOUNTER — Other Ambulatory Visit: Payer: Self-pay

## 2018-04-01 ENCOUNTER — Ambulatory Visit
Admission: RE | Admit: 2018-04-01 | Discharge: 2018-04-01 | Disposition: A | Discharge status: Home / Self Care | Payer: Medicare Other | Attending: Vascular Surgery | Admitting: Vascular Surgery

## 2018-04-01 DIAGNOSIS — Y841 Kidney dialysis as the cause of abnormal reaction of the patient, or of later complication, without mention of misadventure at the time of the procedure: Secondary | ICD-10-CM | POA: Diagnosis not present

## 2018-04-01 DIAGNOSIS — K219 Gastro-esophageal reflux disease without esophagitis: Secondary | ICD-10-CM | POA: Diagnosis not present

## 2018-04-01 DIAGNOSIS — I120 Hypertensive chronic kidney disease with stage 5 chronic kidney disease or end stage renal disease: Secondary | ICD-10-CM | POA: Diagnosis not present

## 2018-04-01 DIAGNOSIS — T8241XA Breakdown (mechanical) of vascular dialysis catheter, initial encounter: Secondary | ICD-10-CM | POA: Insufficient documentation

## 2018-04-01 DIAGNOSIS — I70208 Unspecified atherosclerosis of native arteries of extremities, other extremity: Secondary | ICD-10-CM | POA: Diagnosis not present

## 2018-04-01 DIAGNOSIS — T82898A Other specified complication of vascular prosthetic devices, implants and grafts, initial encounter: Secondary | ICD-10-CM | POA: Diagnosis not present

## 2018-04-01 DIAGNOSIS — N186 End stage renal disease: Secondary | ICD-10-CM | POA: Diagnosis not present

## 2018-04-01 DIAGNOSIS — Z992 Dependence on renal dialysis: Secondary | ICD-10-CM | POA: Diagnosis not present

## 2018-04-01 HISTORY — PX: A/V SHUNTOGRAM: CATH118297

## 2018-04-01 LAB — POTASSIUM (ARMC VASCULAR LAB ONLY): Potassium (ARMC vascular lab): 3.5 (ref 3.5–5.1)

## 2018-04-01 SURGERY — A/V SHUNTOGRAM
Anesthesia: Moderate Sedation | Site: Arm Upper | Laterality: Left

## 2018-04-01 MED ORDER — FAMOTIDINE 20 MG PO TABS: 40.0000 mg | ORAL_TABLET | ORAL | Status: DC | PRN

## 2018-04-01 MED ORDER — LIDOCAINE-EPINEPHRINE (PF) 1 %-1:200000 IJ SOLN
INTRAMUSCULAR | Status: AC
  Filled 2018-04-01: qty 10

## 2018-04-01 MED ORDER — IOPAMIDOL (ISOVUE-300) INJECTION 61%
INTRAVENOUS | Status: DC | PRN
  Administered 2018-04-01: 55 mL via INTRA_ARTERIAL

## 2018-04-01 MED ORDER — SODIUM CHLORIDE 0.9 % IV SOLN
INTRAVENOUS | Status: DC
  Administered 2018-04-01: 08:00:00 via INTRAVENOUS

## 2018-04-01 MED ORDER — FENTANYL CITRATE (PF) 100 MCG/2ML IJ SOLN
INTRAMUSCULAR | Status: DC | PRN
  Administered 2018-04-01: 50 ug via INTRAVENOUS

## 2018-04-01 MED ORDER — HYDROMORPHONE HCL 1 MG/ML IJ SOLN: 1.0000 mg | Freq: Once | INTRAMUSCULAR | Status: DC | PRN

## 2018-04-01 MED ORDER — MIDAZOLAM HCL 2 MG/2ML IJ SOLN
INTRAMUSCULAR | Status: DC | PRN
  Administered 2018-04-01: 2 mg via INTRAVENOUS

## 2018-04-01 MED ORDER — FENTANYL CITRATE (PF) 100 MCG/2ML IJ SOLN
INTRAMUSCULAR | Status: AC
  Filled 2018-04-01: qty 2

## 2018-04-01 MED ORDER — ONDANSETRON HCL 4 MG/2ML IJ SOLN: 4.0000 mg | Freq: Four times a day (QID) | INTRAMUSCULAR | Status: DC | PRN

## 2018-04-01 MED ORDER — METHYLPREDNISOLONE SODIUM SUCC 125 MG IJ SOLR: 125.0000 mg | INTRAMUSCULAR | Status: DC | PRN

## 2018-04-01 MED ORDER — DIPHENHYDRAMINE HCL 50 MG/ML IJ SOLN: 50.0000 mg | Freq: Once | INTRAMUSCULAR | Status: DC

## 2018-04-01 MED ORDER — HEPARIN SODIUM (PORCINE) 1000 UNIT/ML IJ SOLN
INTRAMUSCULAR | Status: DC | PRN
  Administered 2018-04-01: 3000 [IU] via INTRAVENOUS

## 2018-04-01 MED ORDER — MIDAZOLAM HCL 5 MG/5ML IJ SOLN
INTRAMUSCULAR | Status: AC
  Filled 2018-04-01: qty 5

## 2018-04-01 MED ORDER — HEPARIN SODIUM (PORCINE) 1000 UNIT/ML IJ SOLN
INTRAMUSCULAR | Status: AC
  Filled 2018-04-01: qty 1

## 2018-04-01 SURGICAL SUPPLY — 18 items
BALLN DORADO 6X60X80 (BALLOONS) ×2
BALLN LUTONIX DCB 5X60X130 (BALLOONS) ×2
BALLN ULTRVRSE 6X40X130 (BALLOONS) ×2
BALLOON DORADO 6X60X80 (BALLOONS) IMPLANT
BALLOON LUTONIX DCB 5X60X130 (BALLOONS) IMPLANT
BALLOON ULTRVRSE 6X40X130 (BALLOONS) IMPLANT
CANNULA 5F STIFF (CANNULA) ×1 IMPLANT
CATH BEACON 5 .035 40 KMP TP (CATHETERS) IMPLANT
CATH BEACON 5 .038 40 KMP TP (CATHETERS) ×1
DEVICE PRESTO INFLATION (MISCELLANEOUS) ×1 IMPLANT
PACK ANGIOGRAPHY (CUSTOM PROCEDURE TRAY) ×2 IMPLANT
SHEATH BRITE TIP 6FRX5.5 (SHEATH) ×1 IMPLANT
STENT VIABAHN 6X25X120 (Permanent Stent) ×1 IMPLANT
SUT MNCRL AB 4-0 PS2 18 (SUTURE) ×1 IMPLANT
SUT VIC AB 3-0 SH 27 (SUTURE) ×2
SUT VIC AB 3-0 SH 27X BRD (SUTURE) IMPLANT
WIRE G 018X200 V18 (WIRE) ×1 IMPLANT
WIRE MAGIC TOR.035 180C (WIRE) ×1 IMPLANT

## 2018-04-01 NOTE — Op Note (Signed)
[facility]    OPERATIVE NOTE   PROCEDURE: 1.  Left axillary artery HeRO graft cannulation under ultrasound guidance 2.  Left arm shuntogram and LUE angiogram 3.  Percutaneous transluminal angioplasty of left axillary artery proximal to the hero graft anastomosis with a 5 mm diameter drug-coated and 6 mm diameter high-pressure angioplasty balloons 4.  Viabahn stent placement to the left axillary artery proximal to the hero graft anastomosis with 6 mm diameter by 2.5 cm length stent for high-grade residual stenosis after angioplasty  PRE-OPERATIVE DIAGNOSIS: 1. ESRD 2. Malfunctioning left axillary artery based hero dialysis graft  POST-OPERATIVE DIAGNOSIS: same as above   SURGEON: Leotis Pain, MD  ANESTHESIA: local with MCS  ESTIMATED BLOOD LOSS: 10 cc  FINDING(S): 1. The graft itself was patent with no greater than 20% stenosis.  This included the central venous portion which was widely patent.  The axillary artery however proximal to the graft 1 to 2 cm was found to be highly stenotic with stenosis in the 80 to 90% range.  SPECIMEN(S):  None  CONTRAST: 55 cc  FLUORO TIME: 2.1 minutes  MODERATE CONSCIOUS SEDATION TIME:  Approximately 30 minutes using 2 mg of Versed and 50 mcg of Fentanyl  INDICATIONS: [name] is a 43 y.o. female who presents with malfunctioning left arm axillary based hero dialysis graft.  The patient is scheduled for left arm shuntogram.  The patient is aware the risks include but are not limited to: bleeding, infection, thrombosis of the cannulated access, and possible anaphylactic reaction to the contrast.  The patient is aware of the risks of the procedure and elects to proceed forward.  DESCRIPTION: After full informed written consent was obtained, the patient was brought back to the angiography suite and placed supine upon the angiography table.  The patient was connected to monitoring equipment. Moderate conscious sedation  was administered during a face to face encounter throughout the procedure with my supervision of the RN administering medicines and monitoring the patient's vital signs, pulse oximetry, telemetry and mental status throughout from the start of the procedure until the patient was taken to the recovery room The left arm was prepped and draped in the standard fashion for a percutaneous access intervention.  Under ultrasound guidance, the left arm hero graft was cannulated with a micropuncture needle under direct ultrasound guidance were it was patent and a permanent image was performed.  The microwire was advanced into the graft and the needle was exchanged for the a microsheath.  I then upsized to a 6 Fr Sheath and imaging was performed.  Hand injections were completed to image the access including the central venous system. This demonstrated the graft itself was patent with no greater than 20% stenosis.  This included the central venous portion which was widely patent.  The axillary artery however proximal to the graft 1 to 2 cm was found to be highly stenotic with stenosis in the 80 to 90% range.  Based on the images, this patient will need intervention to her arterial iniflow. I then gave the patient 3000 units of intravenous heparin.  I then crossed the axillary artery stenosis with a Magic Tourqe wire.  Based on the imaging, a 5 mm x 6 cm  Lutonix drug angioplasty balloon was selected.  The balloon was centered around the stenosis in the axillary artery proximal to the graft and inflated to 14 ATM for 1 minute(s).  The waist did not break however.  With this, I upsized to  a 6 mm diameter by 4 cm length high-pressure angioplasty balloon and inflated this to 32 atm with before the waist finally resolved.  On completion imaging, a greater than 70 % residual stenosis was present still.  This was somewhat problematic given the location.  Care had to be taken that the stent was entirely in the axillary artery and no  point in the graft or it would impede flow to the rest of the arm.  We exchanged for a 0.018 wire and use magnified angled images to see the anastomosis and clearly stay proximal to this in the axillary artery.  A 6 mm diameter by 2.5 cm length via bond stent was deployed across the residual lesion and postdilated with a 6 mm balloon with excellent angiographic completion result and less than 10% residual stenosis.  Of note, the axillary artery and then brachial artery beyond the graft anastomosis also remained widely patent and there was a palpable pulse distally.   Based on the completion imaging, no further intervention is necessary.  The wire and balloon were removed from the sheath.  A 4-0 Monocryl purse-string suture was sewn around the sheath.  The sheath was removed while tying down the suture.  A sterile bandage was applied to the puncture site.  COMPLICATIONS: None  CONDITION: Stable   Leotis Pain  04/01/2018 9:10 AM    This note was created with Dragon Medical transcription system. Any errors in dictation are purely unintentional.

## 2018-04-01 NOTE — H&P (Signed)
[name] History & Physical Update  The patient was interviewed and re-examined.  The patient's previous History and Physical has been reviewed and is unchanged.  There is no change in the plan of care. We plan to proceed with the scheduled procedure.  Leotis Pain, MD  04/01/2018, 8:08 AM

## 2018-04-01 NOTE — Progress Notes (Signed)
Dr. Lucky Cowboy at bedside and spoke with pt./ caregiver re: procedural results.  Both verbalize understanding.

## 2018-04-02 DIAGNOSIS — D631 Anemia in chronic kidney disease: Secondary | ICD-10-CM | POA: Diagnosis not present

## 2018-04-02 DIAGNOSIS — Z992 Dependence on renal dialysis: Secondary | ICD-10-CM | POA: Diagnosis not present

## 2018-04-02 DIAGNOSIS — D509 Iron deficiency anemia, unspecified: Secondary | ICD-10-CM | POA: Diagnosis not present

## 2018-04-02 DIAGNOSIS — N2581 Secondary hyperparathyroidism of renal origin: Secondary | ICD-10-CM | POA: Diagnosis not present

## 2018-04-02 DIAGNOSIS — N186 End stage renal disease: Secondary | ICD-10-CM | POA: Diagnosis not present

## 2018-04-05 DIAGNOSIS — Z992 Dependence on renal dialysis: Secondary | ICD-10-CM | POA: Diagnosis not present

## 2018-04-05 DIAGNOSIS — N2581 Secondary hyperparathyroidism of renal origin: Secondary | ICD-10-CM | POA: Diagnosis not present

## 2018-04-05 DIAGNOSIS — N186 End stage renal disease: Secondary | ICD-10-CM | POA: Diagnosis not present

## 2018-04-05 DIAGNOSIS — D509 Iron deficiency anemia, unspecified: Secondary | ICD-10-CM | POA: Diagnosis not present

## 2018-04-05 DIAGNOSIS — D631 Anemia in chronic kidney disease: Secondary | ICD-10-CM | POA: Diagnosis not present

## 2018-04-07 DIAGNOSIS — N2581 Secondary hyperparathyroidism of renal origin: Secondary | ICD-10-CM | POA: Diagnosis not present

## 2018-04-07 DIAGNOSIS — Z992 Dependence on renal dialysis: Secondary | ICD-10-CM | POA: Diagnosis not present

## 2018-04-07 DIAGNOSIS — D631 Anemia in chronic kidney disease: Secondary | ICD-10-CM | POA: Diagnosis not present

## 2018-04-07 DIAGNOSIS — N186 End stage renal disease: Secondary | ICD-10-CM | POA: Diagnosis not present

## 2018-04-07 DIAGNOSIS — D509 Iron deficiency anemia, unspecified: Secondary | ICD-10-CM | POA: Diagnosis not present

## 2018-04-09 DIAGNOSIS — N186 End stage renal disease: Secondary | ICD-10-CM | POA: Diagnosis not present

## 2018-04-09 DIAGNOSIS — D631 Anemia in chronic kidney disease: Secondary | ICD-10-CM | POA: Diagnosis not present

## 2018-04-09 DIAGNOSIS — D509 Iron deficiency anemia, unspecified: Secondary | ICD-10-CM | POA: Diagnosis not present

## 2018-04-09 DIAGNOSIS — N2581 Secondary hyperparathyroidism of renal origin: Secondary | ICD-10-CM | POA: Diagnosis not present

## 2018-04-09 DIAGNOSIS — Z992 Dependence on renal dialysis: Secondary | ICD-10-CM | POA: Diagnosis not present

## 2018-04-12 DIAGNOSIS — D509 Iron deficiency anemia, unspecified: Secondary | ICD-10-CM | POA: Diagnosis not present

## 2018-04-12 DIAGNOSIS — D631 Anemia in chronic kidney disease: Secondary | ICD-10-CM | POA: Diagnosis not present

## 2018-04-12 DIAGNOSIS — N186 End stage renal disease: Secondary | ICD-10-CM | POA: Diagnosis not present

## 2018-04-12 DIAGNOSIS — N2581 Secondary hyperparathyroidism of renal origin: Secondary | ICD-10-CM | POA: Diagnosis not present

## 2018-04-12 DIAGNOSIS — Z992 Dependence on renal dialysis: Secondary | ICD-10-CM | POA: Diagnosis not present

## 2018-04-14 DIAGNOSIS — D631 Anemia in chronic kidney disease: Secondary | ICD-10-CM | POA: Diagnosis not present

## 2018-04-14 DIAGNOSIS — N2581 Secondary hyperparathyroidism of renal origin: Secondary | ICD-10-CM | POA: Diagnosis not present

## 2018-04-14 DIAGNOSIS — N186 End stage renal disease: Secondary | ICD-10-CM | POA: Diagnosis not present

## 2018-04-14 DIAGNOSIS — Z992 Dependence on renal dialysis: Secondary | ICD-10-CM | POA: Diagnosis not present

## 2018-04-14 DIAGNOSIS — D509 Iron deficiency anemia, unspecified: Secondary | ICD-10-CM | POA: Diagnosis not present

## 2018-04-16 DIAGNOSIS — D631 Anemia in chronic kidney disease: Secondary | ICD-10-CM | POA: Diagnosis not present

## 2018-04-16 DIAGNOSIS — D509 Iron deficiency anemia, unspecified: Secondary | ICD-10-CM | POA: Diagnosis not present

## 2018-04-16 DIAGNOSIS — Z992 Dependence on renal dialysis: Secondary | ICD-10-CM | POA: Diagnosis not present

## 2018-04-16 DIAGNOSIS — N186 End stage renal disease: Secondary | ICD-10-CM | POA: Diagnosis not present

## 2018-04-16 DIAGNOSIS — N2581 Secondary hyperparathyroidism of renal origin: Secondary | ICD-10-CM | POA: Diagnosis not present

## 2018-04-19 DIAGNOSIS — Z992 Dependence on renal dialysis: Secondary | ICD-10-CM | POA: Diagnosis not present

## 2018-04-19 DIAGNOSIS — D509 Iron deficiency anemia, unspecified: Secondary | ICD-10-CM | POA: Diagnosis not present

## 2018-04-19 DIAGNOSIS — N186 End stage renal disease: Secondary | ICD-10-CM | POA: Diagnosis not present

## 2018-04-19 DIAGNOSIS — D631 Anemia in chronic kidney disease: Secondary | ICD-10-CM | POA: Diagnosis not present

## 2018-04-19 DIAGNOSIS — N2581 Secondary hyperparathyroidism of renal origin: Secondary | ICD-10-CM | POA: Diagnosis not present

## 2018-04-21 DIAGNOSIS — N186 End stage renal disease: Secondary | ICD-10-CM | POA: Diagnosis not present

## 2018-04-21 DIAGNOSIS — Z992 Dependence on renal dialysis: Secondary | ICD-10-CM | POA: Diagnosis not present

## 2018-04-21 DIAGNOSIS — D509 Iron deficiency anemia, unspecified: Secondary | ICD-10-CM | POA: Diagnosis not present

## 2018-04-21 DIAGNOSIS — N2581 Secondary hyperparathyroidism of renal origin: Secondary | ICD-10-CM | POA: Diagnosis not present

## 2018-04-21 DIAGNOSIS — D631 Anemia in chronic kidney disease: Secondary | ICD-10-CM | POA: Diagnosis not present

## 2018-04-23 DIAGNOSIS — D509 Iron deficiency anemia, unspecified: Secondary | ICD-10-CM | POA: Diagnosis not present

## 2018-04-23 DIAGNOSIS — N186 End stage renal disease: Secondary | ICD-10-CM | POA: Diagnosis not present

## 2018-04-23 DIAGNOSIS — N2581 Secondary hyperparathyroidism of renal origin: Secondary | ICD-10-CM | POA: Diagnosis not present

## 2018-04-23 DIAGNOSIS — D631 Anemia in chronic kidney disease: Secondary | ICD-10-CM | POA: Diagnosis not present

## 2018-04-23 DIAGNOSIS — Z992 Dependence on renal dialysis: Secondary | ICD-10-CM | POA: Diagnosis not present

## 2018-04-26 DIAGNOSIS — N2581 Secondary hyperparathyroidism of renal origin: Secondary | ICD-10-CM | POA: Diagnosis not present

## 2018-04-26 DIAGNOSIS — D509 Iron deficiency anemia, unspecified: Secondary | ICD-10-CM | POA: Diagnosis not present

## 2018-04-26 DIAGNOSIS — N186 End stage renal disease: Secondary | ICD-10-CM | POA: Diagnosis not present

## 2018-04-26 DIAGNOSIS — D631 Anemia in chronic kidney disease: Secondary | ICD-10-CM | POA: Diagnosis not present

## 2018-04-26 DIAGNOSIS — Z992 Dependence on renal dialysis: Secondary | ICD-10-CM | POA: Diagnosis not present

## 2018-04-28 DIAGNOSIS — N2581 Secondary hyperparathyroidism of renal origin: Secondary | ICD-10-CM | POA: Diagnosis not present

## 2018-04-28 DIAGNOSIS — D631 Anemia in chronic kidney disease: Secondary | ICD-10-CM | POA: Diagnosis not present

## 2018-04-28 DIAGNOSIS — Z992 Dependence on renal dialysis: Secondary | ICD-10-CM | POA: Diagnosis not present

## 2018-04-28 DIAGNOSIS — D509 Iron deficiency anemia, unspecified: Secondary | ICD-10-CM | POA: Diagnosis not present

## 2018-04-28 DIAGNOSIS — N186 End stage renal disease: Secondary | ICD-10-CM | POA: Diagnosis not present

## 2018-04-30 DIAGNOSIS — Z992 Dependence on renal dialysis: Secondary | ICD-10-CM | POA: Diagnosis not present

## 2018-04-30 DIAGNOSIS — N186 End stage renal disease: Secondary | ICD-10-CM | POA: Diagnosis not present

## 2018-04-30 DIAGNOSIS — N2581 Secondary hyperparathyroidism of renal origin: Secondary | ICD-10-CM | POA: Diagnosis not present

## 2018-04-30 DIAGNOSIS — D509 Iron deficiency anemia, unspecified: Secondary | ICD-10-CM | POA: Diagnosis not present

## 2018-04-30 DIAGNOSIS — D631 Anemia in chronic kidney disease: Secondary | ICD-10-CM | POA: Diagnosis not present

## 2018-05-03 DIAGNOSIS — Z992 Dependence on renal dialysis: Secondary | ICD-10-CM | POA: Diagnosis not present

## 2018-05-03 DIAGNOSIS — D509 Iron deficiency anemia, unspecified: Secondary | ICD-10-CM | POA: Diagnosis not present

## 2018-05-03 DIAGNOSIS — N186 End stage renal disease: Secondary | ICD-10-CM | POA: Diagnosis not present

## 2018-05-03 DIAGNOSIS — N2581 Secondary hyperparathyroidism of renal origin: Secondary | ICD-10-CM | POA: Diagnosis not present

## 2018-05-03 DIAGNOSIS — D631 Anemia in chronic kidney disease: Secondary | ICD-10-CM | POA: Diagnosis not present

## 2018-05-04 ENCOUNTER — Encounter (INDEPENDENT_AMBULATORY_CARE_PROVIDER_SITE_OTHER): Payer: Self-pay | Admitting: Vascular Surgery

## 2018-05-04 ENCOUNTER — Ambulatory Visit (INDEPENDENT_AMBULATORY_CARE_PROVIDER_SITE_OTHER): Payer: Medicare Other | Admitting: Vascular Surgery

## 2018-05-04 ENCOUNTER — Other Ambulatory Visit: Payer: Self-pay

## 2018-05-04 VITALS — BP 115/74 | HR 80 | Resp 10 | Wt 103.0 lb

## 2018-05-04 DIAGNOSIS — Z992 Dependence on renal dialysis: Secondary | ICD-10-CM | POA: Diagnosis not present

## 2018-05-04 DIAGNOSIS — N186 End stage renal disease: Secondary | ICD-10-CM

## 2018-05-04 NOTE — Progress Notes (Signed)
Subjective:    Patient ID: [name], female    DOB: [birth year], 43 y.o.   MRN: [id number] Chief Complaint  Patient presents with  . Follow-up   Patient presents for a monthly post procedure follow-up.  The patient underwent a left upper extremity hero shuntogram with intervention on 04/01/18.  The patient presents today without complaint.  The patient notes an improvement in her graft functioning status post her recent intervention. The patient denies any issues with hemodialysis such as cannulation problems, increased bleeding, decrease in doppler flow or recirculation. The patient also denies any fistula skin breakdown, pain, edema, pallor or ulceration of the arm / hand.  The patient denies any uremic symptoms.  Patient denies any fever, nausea vomiting.  Review of Systems  Constitutional: Negative.   HENT: Negative.   Eyes: Negative.   Respiratory: Negative.   Cardiovascular: Negative.   Gastrointestinal: Negative.   Endocrine: Negative.   Genitourinary: Negative.   Musculoskeletal: Negative.   Skin: Negative.   Allergic/Immunologic: Negative.   Neurological: Negative.   Hematological: Negative.   Psychiatric/Behavioral: Negative.       Objective:   Physical Exam Vitals signs reviewed.  Constitutional:      Appearance: Normal appearance.  HENT:     Head: Normocephalic and atraumatic.     Right Ear: External ear normal.     Left Ear: External ear normal.     Nose: Nose normal.     Mouth/Throat:     Mouth: Mucous membranes are moist.     Pharynx: Oropharynx is clear.  Eyes:     Extraocular Movements: Extraocular movements intact.     Conjunctiva/sclera: Conjunctivae normal.     Pupils: Pupils are equal, round, and reactive to light.  Neck:     Musculoskeletal: Normal range of motion.  Cardiovascular:     Rate and Rhythm: Normal rate and regular rhythm.     Comments: Left upper extremity: Good bruit and thrill.  Skin is intact.  2+ radial pulse.  Motor/sensory  is intact to the left upper extremity Pulmonary:     Effort: Pulmonary effort is normal.     Breath sounds: Normal breath sounds.  Musculoskeletal: Normal range of motion.  Skin:    General: Skin is warm and dry.  Neurological:     General: No focal deficit present.     Mental Status: She is alert and oriented to person, place, and time.  Psychiatric:        Mood and Affect: Mood normal.        Behavior: Behavior normal.        Thought Content: Thought content normal.        Judgment: Judgment normal.    BP 115/74 (BP Location: Right Arm, Patient Position: Sitting, Cuff Size: Small)   Pulse 80   Resp 10   Wt 103 lb (46.7 kg)   BMI 18.25 kg/m   Past Medical History:  Diagnosis Date  . Anemia   . BV (bacterial vaginosis) 11/08/2013  . Depression   . Dialysis patient ([place])   . DVT (deep venous thrombosis) ([place])   . Hypertension   . Lupus ([place])   . Renal disorder   . Vaginal odor 11/08/2013   Social History   Socioeconomic History  . Marital status: Single    Spouse name: Not on file  . Number of children: Not on file  . Years of education: Not on file  . Highest education level: Not on file  Occupational History  .  Not on file  Social Needs  . Financial resource strain: Not on file  . Food insecurity:    Worry: Not on file    Inability: Not on file  . Transportation needs:    Medical: Not on file    Non-medical: Not on file  Tobacco Use  . Smoking status: Current Every Day Smoker    Packs/day: 0.50    Years: 15.00    Pack years: 7.50    Types: Cigarettes  . Smokeless tobacco: Never Used  Substance and Sexual Activity  . Alcohol use: No  . Drug use: No  . Sexual activity: Not Currently    Birth control/protection: None  Lifestyle  . Physical activity:    Days per week: Not on file    Minutes per session: Not on file  . Stress: Not on file  Relationships  . Social connections:    Talks on phone: Not on file    Gets together: Not on file    Attends  religious service: Not on file    Active member of club or organization: Not on file    Attends meetings of clubs or organizations: Not on file    Relationship status: Not on file  . Intimate partner violence:    Fear of current or ex partner: Not on file    Emotionally abused: Not on file    Physically abused: Not on file    Forced sexual activity: Not on file  Other Topics Concern  . Not on file  Social History Narrative  . Not on file   Past Surgical History:  Procedure Laterality Date  . A/V SHUNT INTERVENTION N/A 10/24/2016   Procedure: A/V SHUNT INTERVENTION;  Surgeon: Katha Cabal, MD;  Location: [place] [facility];  Service: Cardiovascular;  Laterality: N/A;  . A/V SHUNTOGRAM Left 12/08/2017   Procedure: A/V SHUNTOGRAM;  Surgeon: Katha Cabal, MD;  Location: [place] [facility];  Service: Cardiovascular;  Laterality: Left;  . A/V SHUNTOGRAM Left 01/28/2018   Procedure: A/V SHUNTOGRAM;  Surgeon: Algernon Huxley, MD;  Location: [place] [facility];  Service: Cardiovascular;  Laterality: Left;  . A/V SHUNTOGRAM Left 04/01/2018   Procedure: A/V SHUNTOGRAM;  Surgeon: Algernon Huxley, MD;  Location: [place] [facility];  Service: Cardiovascular;  Laterality: Left;  . AV FISTULA PLACEMENT    . DIALYSIS/PERMA CATHETER INSERTION  10/23/2016   Procedure: DIALYSIS/PERMA CATHETER INSERTION;  Surgeon: Algernon Huxley, MD;  Location: [place] [facility];  Service: Cardiovascular;;  . IR REMOVAL TUN CV CATH W/O FL  09/22/2017  . IR THROMBECTOMY AV FISTULA W/THROMBOLYSIS INC/SHUNT/IMG LEFT Left 09/04/2017  . IR US GUIDE VASC ACCESS LEFT  09/04/2017  . PERIPHERAL VASCULAR CATHETERIZATION Left 01/04/2015   Procedure: A/V Shuntogram/Fistulagram;  Surgeon: Algernon Huxley, MD;  Location: [facility];  Service: Cardiovascular;  Laterality: Left;  . PERIPHERAL VASCULAR CATHETERIZATION N/A 01/04/2015   Procedure: A/V Shunt Intervention;  Surgeon: Algernon Huxley, MD;  Location: [place] [facility];  Service: Cardiovascular;  Laterality: N/A;  . PERIPHERAL VASCULAR CATHETERIZATION Left 04/13/2015   Procedure: A/V Shuntogram/Fistulagram;  Surgeon: Katha Cabal, MD;  Location: [place] [facility];  Service: Cardiovascular;  Laterality: Left;  . PERIPHERAL VASCULAR CATHETERIZATION Left 04/13/2015   Procedure: A/V Shunt Intervention;  Surgeon: Katha Cabal, MD;  Location: [place] [facility];  Service: Cardiovascular;  Laterality: Left;   Family History  Problem Relation Age of Onset  . Seizures  Son   . Kidney disease Mother        on dialysis  . Lupus Mother    Allergies  Allergen Reactions  . Dust Mite Extract Other (See Comments)    sneezing      Assessment & Plan:  Patient presents for a monthly post procedure follow-up.  The patient underwent a left upper extremity hero shuntogram with intervention on 04/01/18.  The patient presents today without complaint.  The patient notes an improvement in her graft functioning status post her recent intervention. The patient denies any issues with hemodialysis such as cannulation problems, increased bleeding, decrease in doppler flow or recirculation. The patient also denies any fistula skin breakdown, pain, edema, pallor or ulceration of the arm / hand.  The patient denies any uremic symptoms.  Patient denies any fever, nausea vomiting.  1) ESRD - Stable Patient with recent intervention to the left upper extremity hero graft on April 01, 2018. There has been in the improvement in the functioning of her graft. Physical exam shows a good bruit and thrill I will bring the patient back in 6 months to continue to surveilled her hemodialysis access The patient was instructed to call the office in the interim if any issues with dialysis access / doppler flow, pain, edema, pallor, fistula skin breakdown or ulceration of the arm / hand occur. The patient expressed their understanding.  Current Outpatient Medications on File Prior to  Visit  Medication Sig Dispense Refill  . albuterol (PROVENTIL) (2.5 MG/3ML) 0.083% nebulizer solution Take 2.5 mg by nebulization 4 (four) times daily as needed (breathing).    . ALPRAZolam (XANAX) 0.25 MG tablet Take 0.25 mg by mouth 2 (two) times daily.     Marland Kitchen aspirin EC 81 MG tablet Take 81 mg by mouth daily.    . chlorhexidine (PERIDEX) 0.12 % solution Use as directed 5 mLs in the mouth or throat 2 (two) times daily.     . clopidogrel (PLAVIX) 75 MG tablet Take 75 mg by mouth daily.    Marland Kitchen docusate sodium (COLACE) 100 MG capsule Take 100 mg by mouth daily.    Marland Kitchen epoetin alfa (EPOGEN,PROCRIT) 4000 UNIT/ML injection Inject 4,000 Units into the vein. Mon, wed, and Friday at dialysis    . guaiFENesin (ROBITUSSIN) 100 MG/5ML liquid Take 200 mg by mouth 3 (three) times daily as needed for cough.    . hydroxychloroquine (PLAQUENIL) 200 MG tablet Take 200 mg by mouth daily.    Marland Kitchen ipratropium (ATROVENT) 0.02 % nebulizer solution Take 250 mcg by nebulization 4 (four) times daily as needed (breathing).    Marland Kitchen lidocaine-prilocaine (EMLA) cream Apply 1 application topically as needed (for port access).     Marland Kitchen loratadine (CLARITIN) 10 MG tablet Take 10 mg by mouth daily as needed for allergies.    . midodrine (PROAMATINE) 10 MG tablet Take 10 mg by mouth 2 (two) times daily.     . mirtazapine (REMERON) 15 MG tablet 15 mg at bedtime.     . multivitamin (RENA-VIT) TABS tablet Take 1 tablet by mouth daily.    . pantoprazole (PROTONIX) 40 MG tablet Take 40 mg by mouth daily.    . phenytoin (DILANTIN) 100 MG ER capsule Take 100 mg by mouth 2 (two) times daily.     . sertraline (ZOLOFT) 100 MG tablet Take 200 mg by mouth daily.     . sevelamer carbonate (RENVELA) 800 MG tablet Take 800 mg by mouth 3 (three) times daily with meals. Take    .  simvastatin (ZOCOR) 40 MG tablet Take 40 mg by mouth daily.     No current facility-administered medications on file prior to visit.    There are no Patient Instructions on file  for this visit. No follow-ups on file.  KIMBERLY A STEGMAYER, PA-C

## 2018-05-05 DIAGNOSIS — D509 Iron deficiency anemia, unspecified: Secondary | ICD-10-CM | POA: Diagnosis not present

## 2018-05-05 DIAGNOSIS — N186 End stage renal disease: Secondary | ICD-10-CM | POA: Diagnosis not present

## 2018-05-05 DIAGNOSIS — D631 Anemia in chronic kidney disease: Secondary | ICD-10-CM | POA: Diagnosis not present

## 2018-05-05 DIAGNOSIS — Z992 Dependence on renal dialysis: Secondary | ICD-10-CM | POA: Diagnosis not present

## 2018-05-05 DIAGNOSIS — N2581 Secondary hyperparathyroidism of renal origin: Secondary | ICD-10-CM | POA: Diagnosis not present

## 2018-05-06 ENCOUNTER — Ambulatory Visit (INDEPENDENT_AMBULATORY_CARE_PROVIDER_SITE_OTHER): Payer: Medicare Other | Admitting: General Surgery

## 2018-05-06 ENCOUNTER — Ambulatory Visit: Payer: Medicare Other | Admitting: General Surgery

## 2018-05-06 ENCOUNTER — Encounter: Payer: Self-pay | Admitting: General Surgery

## 2018-05-06 VITALS — BP 146/64 | HR 72 | Temp 98.0°F | Resp 18 | Wt 106.0 lb

## 2018-05-06 DIAGNOSIS — D1724 Benign lipomatous neoplasm of skin and subcutaneous tissue of left leg: Secondary | ICD-10-CM

## 2018-05-06 NOTE — Patient Instructions (Signed)
  Stop Plavix five days before the procedure   Lipoma  A lipoma is a noncancerous (benign) tumor that is made up of fat cells. This is a very common type of soft-tissue growth. Lipomas are usually found under the skin (subcutaneous). They may occur in any tissue of the body that contains fat. Common areas for lipomas to appear include the back, shoulders, buttocks, and thighs.  Lipomas grow slowly, and they are usually painless. Most lipomas do not cause problems and do not require treatment. What are the causes? The cause of this condition is not known. What increases the risk? You are more likely to develop this condition if:  You are 48-2 years old.  You have a family history of lipomas. What are the signs or symptoms? A lipoma usually appears as a small, round bump under the skin. In most cases, the lump will:  Feel soft or rubbery.  Not cause pain or other symptoms. However, if a lipoma is located in an area where it pushes on nerves, it can become painful or cause other symptoms. How is this diagnosed? A lipoma can usually be diagnosed with a physical exam. You may also have tests to confirm the diagnosis and to rule out other conditions. Tests may include:  Imaging tests, such as a CT scan or MRI.  Removal of a tissue sample to be looked at under a microscope (biopsy). How is this treated? Treatment for this condition depends on the size of the lipoma and whether it is causing any symptoms.  For small lipomas that are not causing problems, no treatment is needed.  If a lipoma is bigger or it causes problems, surgery may be done to remove the lipoma. Lipomas can also be removed to improve appearance. Most often, the procedure is done after applying a medicine that numbs the area (local anesthetic). Follow these instructions at home:  Watch your lipoma for any changes.  Keep all follow-up visits as told by your health care provider. This is important. Contact a health  care provider if:  Your lipoma becomes larger or hard.  Your lipoma becomes painful, red, or increasingly swollen. These could be signs of infection or a more serious condition. Get help right away if:  You develop tingling or numbness in an area near the lipoma. This could indicate that the lipoma is causing nerve damage. Summary  A lipoma is a noncancerous tumor that is made up of fat cells.  Most lipomas do not cause problems and do not require treatment.  If a lipoma is bigger or it causes problems, surgery may be done to remove the lipoma. This information is not intended to replace advice given to you by your health care provider. Make sure you discuss any questions you have with your health care provider. Document Released: 02/21/2002 Document Revised: 02/17/2017 Document Reviewed: 02/17/2017 Elsevier Interactive Patient Education  2019 [employer].

## 2018-05-06 NOTE — Progress Notes (Signed)
[name]; [id number]; 04-17-1975   HPI Patient is a 43 year old black female who was referred to my care by Dr. Luan Pulling for evaluation treatment of a mass on her left thigh.  The patient states the mass has been present for many years, though increased growth has been noted over the last year.  She has been told in the past that this is a lipoma.  It is starting to cause her discomfort.  She does have end-stage renal disease and gets dialyzed every Monday, Wednesday, and Friday.  She is on Plavix.  She currently has no pain at the site of the mass.  It is uncomfortable when pressure is applied. Past Medical History:  Diagnosis Date  . Anemia   . BV (bacterial vaginosis) 11/08/2013  . Depression   . Dialysis patient ([place])   . DVT (deep venous thrombosis) ([place])   . Hypertension   . Lupus ([place])   . Renal disorder   . Vaginal odor 11/08/2013    Past Surgical History:  Procedure Laterality Date  . A/V SHUNT INTERVENTION N/A 10/24/2016   Procedure: A/V SHUNT INTERVENTION;  Surgeon: Katha Cabal, MD;  Location: [facility] [facility];  Service: Cardiovascular;  Laterality: N/A;  . A/V SHUNTOGRAM Left 12/08/2017   Procedure: A/V SHUNTOGRAM;  Surgeon: Katha Cabal, MD;  Location: [place] [facility];  Service: Cardiovascular;  Laterality: Left;  . A/V SHUNTOGRAM Left 01/28/2018   Procedure: A/V SHUNTOGRAM;  Surgeon: Algernon Huxley, MD;  Location: [place] [facility];  Service: Cardiovascular;  Laterality: Left;  . A/V SHUNTOGRAM Left 04/01/2018   Procedure: A/V SHUNTOGRAM;  Surgeon: Algernon Huxley, MD;  Location: [place] [facility];  Service: Cardiovascular;  Laterality: Left;  . AV FISTULA PLACEMENT    . DIALYSIS/PERMA CATHETER INSERTION  10/23/2016   Procedure: DIALYSIS/PERMA CATHETER INSERTION;  Surgeon: Algernon Huxley, MD;  Location: [facility];  Service: Cardiovascular;;  . IR REMOVAL TUN CV CATH W/O FL  09/22/2017  . IR THROMBECTOMY AV FISTULA W/THROMBOLYSIS INC/SHUNT/IMG  LEFT Left 09/04/2017  . IR US GUIDE VASC ACCESS LEFT  09/04/2017  . PERIPHERAL VASCULAR CATHETERIZATION Left 01/04/2015   Procedure: A/V Shuntogram/Fistulagram;  Surgeon: Algernon Huxley, MD;  Location: [facility];  Service: Cardiovascular;  Laterality: Left;  . PERIPHERAL VASCULAR CATHETERIZATION N/A 01/04/2015   Procedure: A/V Shunt Intervention;  Surgeon: Algernon Huxley, MD;  Location: [facility] [facility];  Service: Cardiovascular;  Laterality: N/A;  . PERIPHERAL VASCULAR CATHETERIZATION Left 04/13/2015   Procedure: A/V Shuntogram/Fistulagram;  Surgeon: Katha Cabal, MD;  Location: [place] [facility];  Service: Cardiovascular;  Laterality: Left;  . PERIPHERAL VASCULAR CATHETERIZATION Left 04/13/2015   Procedure: A/V Shunt Intervention;  Surgeon: Katha Cabal, MD;  Location: [place] [facility];  Service: Cardiovascular;  Laterality: Left;    Family History  Problem Relation Age of Onset  . Seizures Son   . Kidney disease Mother        on dialysis  . Lupus Mother     Current Outpatient Medications on File Prior to Visit  Medication Sig Dispense Refill  . albuterol (PROVENTIL) (2.5 MG/3ML) 0.083% nebulizer solution Take 2.5 mg by nebulization 4 (four) times daily as needed (breathing).    . ALPRAZolam (XANAX) 0.25 MG tablet Take 0.25 mg by mouth 2 (two) times daily.     Marland Kitchen aspirin EC 81 MG tablet Take 81 mg by mouth daily.    . chlorhexidine (PERIDEX) 0.12 %  solution Use as directed 5 mLs in the mouth or throat 2 (two) times daily.     . clopidogrel (PLAVIX) 75 MG tablet Take 75 mg by mouth daily.    Marland Kitchen docusate sodium (COLACE) 100 MG capsule Take 100 mg by mouth daily.    Marland Kitchen epoetin alfa (EPOGEN,PROCRIT) 4000 UNIT/ML injection Inject 4,000 Units into the vein. Mon, wed, and Friday at dialysis    . guaiFENesin (ROBITUSSIN) 100 MG/5ML liquid Take 200 mg by mouth 3 (three) times daily as needed for cough.    . hydroxychloroquine (PLAQUENIL) 200 MG tablet Take 200 mg by mouth  daily.    Marland Kitchen ipratropium (ATROVENT) 0.02 % nebulizer solution Take 250 mcg by nebulization 4 (four) times daily as needed (breathing).    Marland Kitchen lidocaine-prilocaine (EMLA) cream Apply 1 application topically as needed (for port access).     Marland Kitchen loratadine (CLARITIN) 10 MG tablet Take 10 mg by mouth daily as needed for allergies.    . midodrine (PROAMATINE) 10 MG tablet Take 10 mg by mouth 2 (two) times daily.     . mirtazapine (REMERON) 15 MG tablet 15 mg at bedtime.     . multivitamin (RENA-VIT) TABS tablet Take 1 tablet by mouth daily.    . pantoprazole (PROTONIX) 40 MG tablet Take 40 mg by mouth daily.    . phenytoin (DILANTIN) 100 MG ER capsule Take 100 mg by mouth 2 (two) times daily.     . sertraline (ZOLOFT) 100 MG tablet Take 200 mg by mouth daily.     . sevelamer carbonate (RENVELA) 800 MG tablet Take 800 mg by mouth 3 (three) times daily with meals. Take    . simvastatin (ZOCOR) 40 MG tablet Take 40 mg by mouth daily.     No current facility-administered medications on file prior to visit.     Allergies  Allergen Reactions  . Dust Mite Extract Other (See Comments)    sneezing    Social History   Substance and Sexual Activity  Alcohol Use No    Social History   Tobacco Use  Smoking Status Current Every Day Smoker  . Packs/day: 0.50  . Years: 15.00  . Pack years: 7.50  . Types: Cigarettes  Smokeless Tobacco Never Used    Review of Systems  Constitutional: Positive for malaise/fatigue.  HENT: Negative.   Eyes: Negative.   Respiratory: Negative.   Cardiovascular: Negative.   Gastrointestinal: Negative.   Genitourinary: Negative.   Musculoskeletal: Negative.   Skin: Negative.   Neurological: Negative.   Endo/Heme/Allergies: Bruises/bleeds easily.  Psychiatric/Behavioral: Negative.     Objective   Vitals:   05/06/18 1053  BP: (!) 146/64  Pulse: 72  Resp: 18  Temp: 98 F (36.7 C)    Physical Exam Vitals signs reviewed.  Constitutional:      Appearance:  Normal appearance. She is normal weight. She is not ill-appearing.  Cardiovascular:     Rate and Rhythm: Normal rate and regular rhythm.     Heart sounds: Normal heart sounds. No murmur. No friction rub. No gallop.   Pulmonary:     Effort: Pulmonary effort is normal. No respiratory distress.     Breath sounds: Normal breath sounds. No stridor. No wheezing, rhonchi or rales.  Musculoskeletal:     Comments: 3 x 5 cm ovoid, rubbery subcutaneous mobile mass noted at the left anterior thigh.  No skin changes are present.  No erythema is present.  Skin:    General: Skin is warm and dry.  Neurological:     Mental Status: She is alert and oriented to person, place, and time.   Primary care notes reviewed  Assessment  Enlarging mass, left thigh.  Probable lipoma Plan   As patient is a ward of the state, consent for surgery will be obtained from them.  The patient would like to proceed with surgery.  The risks and benefits of the procedure including bleeding and infection were fully explained to the patient and the social worker who is her power of attorney, who gave informed consent.  They will call to schedule the surgery.

## 2018-05-07 DIAGNOSIS — D509 Iron deficiency anemia, unspecified: Secondary | ICD-10-CM | POA: Diagnosis not present

## 2018-05-07 DIAGNOSIS — D631 Anemia in chronic kidney disease: Secondary | ICD-10-CM | POA: Diagnosis not present

## 2018-05-07 DIAGNOSIS — N186 End stage renal disease: Secondary | ICD-10-CM | POA: Diagnosis not present

## 2018-05-07 DIAGNOSIS — N2581 Secondary hyperparathyroidism of renal origin: Secondary | ICD-10-CM | POA: Diagnosis not present

## 2018-05-07 DIAGNOSIS — Z992 Dependence on renal dialysis: Secondary | ICD-10-CM | POA: Diagnosis not present

## 2018-05-10 DIAGNOSIS — Z992 Dependence on renal dialysis: Secondary | ICD-10-CM | POA: Diagnosis not present

## 2018-05-10 DIAGNOSIS — D509 Iron deficiency anemia, unspecified: Secondary | ICD-10-CM | POA: Diagnosis not present

## 2018-05-10 DIAGNOSIS — N2581 Secondary hyperparathyroidism of renal origin: Secondary | ICD-10-CM | POA: Diagnosis not present

## 2018-05-10 DIAGNOSIS — N186 End stage renal disease: Secondary | ICD-10-CM | POA: Diagnosis not present

## 2018-05-10 DIAGNOSIS — D631 Anemia in chronic kidney disease: Secondary | ICD-10-CM | POA: Diagnosis not present

## 2018-05-12 DIAGNOSIS — N2581 Secondary hyperparathyroidism of renal origin: Secondary | ICD-10-CM | POA: Diagnosis not present

## 2018-05-12 DIAGNOSIS — Z992 Dependence on renal dialysis: Secondary | ICD-10-CM | POA: Diagnosis not present

## 2018-05-12 DIAGNOSIS — N186 End stage renal disease: Secondary | ICD-10-CM | POA: Diagnosis not present

## 2018-05-12 DIAGNOSIS — D631 Anemia in chronic kidney disease: Secondary | ICD-10-CM | POA: Diagnosis not present

## 2018-05-12 DIAGNOSIS — D509 Iron deficiency anemia, unspecified: Secondary | ICD-10-CM | POA: Diagnosis not present

## 2018-05-14 DIAGNOSIS — D631 Anemia in chronic kidney disease: Secondary | ICD-10-CM | POA: Diagnosis not present

## 2018-05-14 DIAGNOSIS — D509 Iron deficiency anemia, unspecified: Secondary | ICD-10-CM | POA: Diagnosis not present

## 2018-05-14 DIAGNOSIS — N2581 Secondary hyperparathyroidism of renal origin: Secondary | ICD-10-CM | POA: Diagnosis not present

## 2018-05-14 DIAGNOSIS — N186 End stage renal disease: Secondary | ICD-10-CM | POA: Diagnosis not present

## 2018-05-14 DIAGNOSIS — Z992 Dependence on renal dialysis: Secondary | ICD-10-CM | POA: Diagnosis not present

## 2018-05-14 NOTE — H&P (Signed)
[name]; [id number]; [birth year]   HPI Patient is a 43 year old black female who was referred to my care by Dr. Luan Pulling for evaluation treatment of a mass on her left thigh.  The patient states the mass has been present for many years, though increased growth has been noted over the last year.  She has been told in the past that this is a lipoma.  It is starting to cause her discomfort.  She does have end-stage renal disease and gets dialyzed every Monday, Wednesday, and Friday.  She is on Plavix.  She currently has no pain at the site of the mass.  It is uncomfortable when pressure is applied. Past Medical History:  Diagnosis Date  . Anemia   . BV (bacterial vaginosis) 11/08/2013  . Depression   . Dialysis patient ([place])   . DVT (deep venous thrombosis) ([place])   . Hypertension   . Lupus ([place])   . Renal disorder   . Vaginal odor 11/08/2013    Past Surgical History:  Procedure Laterality Date  . A/V SHUNT INTERVENTION N/A 10/24/2016   Procedure: A/V SHUNT INTERVENTION;  Surgeon: Katha Cabal, MD;  Location: [facility];  Service: Cardiovascular;  Laterality: N/A;  . A/V SHUNTOGRAM Left 12/08/2017   Procedure: A/V SHUNTOGRAM;  Surgeon: Katha Cabal, MD;  Location: [place] [facility];  Service: Cardiovascular;  Laterality: Left;  . A/V SHUNTOGRAM Left 01/28/2018   Procedure: A/V SHUNTOGRAM;  Surgeon: Algernon Huxley, MD;  Location: [place] [facility];  Service: Cardiovascular;  Laterality: Left;  . A/V SHUNTOGRAM Left 04/01/2018   Procedure: A/V SHUNTOGRAM;  Surgeon: Algernon Huxley, MD;  Location: [facility];  Service: Cardiovascular;  Laterality: Left;  . AV FISTULA PLACEMENT    . DIALYSIS/PERMA CATHETER INSERTION  10/23/2016   Procedure: DIALYSIS/PERMA CATHETER INSERTION;  Surgeon: Algernon Huxley, MD;  Location: [facility];  Service: Cardiovascular;;  . IR REMOVAL TUN CV CATH W/O FL  09/22/2017  . IR THROMBECTOMY AV FISTULA W/THROMBOLYSIS INC/SHUNT/IMG  LEFT Left 09/04/2017  . IR US GUIDE VASC ACCESS LEFT  09/04/2017  . PERIPHERAL VASCULAR CATHETERIZATION Left 01/04/2015   Procedure: A/V Shuntogram/Fistulagram;  Surgeon: Algernon Huxley, MD;  Location: [place] [facility];  Service: Cardiovascular;  Laterality: Left;  . PERIPHERAL VASCULAR CATHETERIZATION N/A 01/04/2015   Procedure: A/V Shunt Intervention;  Surgeon: Algernon Huxley, MD;  Location: [place] [facility];  Service: Cardiovascular;  Laterality: N/A;  . PERIPHERAL VASCULAR CATHETERIZATION Left 04/13/2015   Procedure: A/V Shuntogram/Fistulagram;  Surgeon: Katha Cabal, MD;  Location: [place] [facility];  Service: Cardiovascular;  Laterality: Left;  . PERIPHERAL VASCULAR CATHETERIZATION Left 04/13/2015   Procedure: A/V Shunt Intervention;  Surgeon: Katha Cabal, MD;  Location: [place];  Service: Cardiovascular;  Laterality: Left;    Family History  Problem Relation Age of Onset  . Seizures Son   . Kidney disease Mother        on dialysis  . Lupus Mother     Current Outpatient Medications on File Prior to Visit  Medication Sig Dispense Refill  . albuterol (PROVENTIL) (2.5 MG/3ML) 0.083% nebulizer solution Take 2.5 mg by nebulization 4 (four) times daily as needed (breathing).    . ALPRAZolam (XANAX) 0.25 MG tablet Take 0.25 mg by mouth 2 (two) times daily.     Marland Kitchen aspirin EC 81 MG tablet Take 81 mg by mouth daily.    . chlorhexidine (PERIDEX) 0.12 %  solution Use as directed 5 mLs in the mouth or throat 2 (two) times daily.     . clopidogrel (PLAVIX) 75 MG tablet Take 75 mg by mouth daily.    Marland Kitchen docusate sodium (COLACE) 100 MG capsule Take 100 mg by mouth daily.    Marland Kitchen epoetin alfa (EPOGEN,PROCRIT) 4000 UNIT/ML injection Inject 4,000 Units into the vein. Mon, wed, and Friday at dialysis    . guaiFENesin (ROBITUSSIN) 100 MG/5ML liquid Take 200 mg by mouth 3 (three) times daily as needed for cough.    . hydroxychloroquine (PLAQUENIL) 200 MG tablet Take 200 mg by mouth  daily.    Marland Kitchen ipratropium (ATROVENT) 0.02 % nebulizer solution Take 250 mcg by nebulization 4 (four) times daily as needed (breathing).    Marland Kitchen lidocaine-prilocaine (EMLA) cream Apply 1 application topically as needed (for port access).     Marland Kitchen loratadine (CLARITIN) 10 MG tablet Take 10 mg by mouth daily as needed for allergies.    . midodrine (PROAMATINE) 10 MG tablet Take 10 mg by mouth 2 (two) times daily.     . mirtazapine (REMERON) 15 MG tablet 15 mg at bedtime.     . multivitamin (RENA-VIT) TABS tablet Take 1 tablet by mouth daily.    . pantoprazole (PROTONIX) 40 MG tablet Take 40 mg by mouth daily.    . phenytoin (DILANTIN) 100 MG ER capsule Take 100 mg by mouth 2 (two) times daily.     . sertraline (ZOLOFT) 100 MG tablet Take 200 mg by mouth daily.     . sevelamer carbonate (RENVELA) 800 MG tablet Take 800 mg by mouth 3 (three) times daily with meals. Take    . simvastatin (ZOCOR) 40 MG tablet Take 40 mg by mouth daily.     No current facility-administered medications on file prior to visit.     Allergies  Allergen Reactions  . Dust Mite Extract Other (See Comments)    sneezing    Social History   Substance and Sexual Activity  Alcohol Use No    Social History   Tobacco Use  Smoking Status Current Every Day Smoker  . Packs/day: 0.50  . Years: 15.00  . Pack years: 7.50  . Types: Cigarettes  Smokeless Tobacco Never Used    Review of Systems  Constitutional: Positive for malaise/fatigue.  HENT: Negative.   Eyes: Negative.   Respiratory: Negative.   Cardiovascular: Negative.   Gastrointestinal: Negative.   Genitourinary: Negative.   Musculoskeletal: Negative.   Skin: Negative.   Neurological: Negative.   Endo/Heme/Allergies: Bruises/bleeds easily.  Psychiatric/Behavioral: Negative.     Objective   Vitals:   05/06/18 1053  BP: (!) 146/64  Pulse: 72  Resp: 18  Temp: 98 F (36.7 C)    Physical Exam Vitals signs reviewed.  Constitutional:      Appearance:  Normal appearance. She is normal weight. She is not ill-appearing.  Cardiovascular:     Rate and Rhythm: Normal rate and regular rhythm.     Heart sounds: Normal heart sounds. No murmur. No friction rub. No gallop.   Pulmonary:     Effort: Pulmonary effort is normal. No respiratory distress.     Breath sounds: Normal breath sounds. No stridor. No wheezing, rhonchi or rales.  Musculoskeletal:     Comments: 3 x 5 cm ovoid, rubbery subcutaneous mobile mass noted at the left anterior thigh.  No skin changes are present.  No erythema is present.  Skin:    General: Skin is warm and dry.  Neurological:     Mental Status: She is alert and oriented to person, place, and time.   Primary care notes reviewed  Assessment  Enlarging mass, left thigh.  Probable lipoma Plan   As patient is a ward of the state, consent for surgery will be obtained from them.  The patient would like to proceed with surgery.  The risks and benefits of the procedure including bleeding and infection were fully explained to the patient and the social worker who is her power of attorney, who gave informed consent.  They will call to schedule the surgery.

## 2018-05-15 DIAGNOSIS — Z992 Dependence on renal dialysis: Secondary | ICD-10-CM | POA: Diagnosis not present

## 2018-05-15 DIAGNOSIS — N186 End stage renal disease: Secondary | ICD-10-CM | POA: Diagnosis not present

## 2018-05-17 DIAGNOSIS — D509 Iron deficiency anemia, unspecified: Secondary | ICD-10-CM | POA: Diagnosis not present

## 2018-05-17 DIAGNOSIS — N186 End stage renal disease: Secondary | ICD-10-CM | POA: Diagnosis not present

## 2018-05-17 DIAGNOSIS — Z992 Dependence on renal dialysis: Secondary | ICD-10-CM | POA: Diagnosis not present

## 2018-05-17 DIAGNOSIS — D631 Anemia in chronic kidney disease: Secondary | ICD-10-CM | POA: Diagnosis not present

## 2018-05-17 DIAGNOSIS — N2581 Secondary hyperparathyroidism of renal origin: Secondary | ICD-10-CM | POA: Diagnosis not present

## 2018-05-19 DIAGNOSIS — N186 End stage renal disease: Secondary | ICD-10-CM | POA: Diagnosis not present

## 2018-05-19 DIAGNOSIS — N2581 Secondary hyperparathyroidism of renal origin: Secondary | ICD-10-CM | POA: Diagnosis not present

## 2018-05-19 DIAGNOSIS — D631 Anemia in chronic kidney disease: Secondary | ICD-10-CM | POA: Diagnosis not present

## 2018-05-19 DIAGNOSIS — Z992 Dependence on renal dialysis: Secondary | ICD-10-CM | POA: Diagnosis not present

## 2018-05-19 DIAGNOSIS — D509 Iron deficiency anemia, unspecified: Secondary | ICD-10-CM | POA: Diagnosis not present

## 2018-05-21 DIAGNOSIS — D509 Iron deficiency anemia, unspecified: Secondary | ICD-10-CM | POA: Diagnosis not present

## 2018-05-21 DIAGNOSIS — D631 Anemia in chronic kidney disease: Secondary | ICD-10-CM | POA: Diagnosis not present

## 2018-05-21 DIAGNOSIS — N186 End stage renal disease: Secondary | ICD-10-CM | POA: Diagnosis not present

## 2018-05-21 DIAGNOSIS — N2581 Secondary hyperparathyroidism of renal origin: Secondary | ICD-10-CM | POA: Diagnosis not present

## 2018-05-21 DIAGNOSIS — Z992 Dependence on renal dialysis: Secondary | ICD-10-CM | POA: Diagnosis not present

## 2018-05-24 DIAGNOSIS — D631 Anemia in chronic kidney disease: Secondary | ICD-10-CM | POA: Diagnosis not present

## 2018-05-24 DIAGNOSIS — N2581 Secondary hyperparathyroidism of renal origin: Secondary | ICD-10-CM | POA: Diagnosis not present

## 2018-05-24 DIAGNOSIS — D509 Iron deficiency anemia, unspecified: Secondary | ICD-10-CM | POA: Diagnosis not present

## 2018-05-24 DIAGNOSIS — Z992 Dependence on renal dialysis: Secondary | ICD-10-CM | POA: Diagnosis not present

## 2018-05-24 DIAGNOSIS — N186 End stage renal disease: Secondary | ICD-10-CM | POA: Diagnosis not present

## 2018-05-26 DIAGNOSIS — N2581 Secondary hyperparathyroidism of renal origin: Secondary | ICD-10-CM | POA: Diagnosis not present

## 2018-05-26 DIAGNOSIS — Z992 Dependence on renal dialysis: Secondary | ICD-10-CM | POA: Diagnosis not present

## 2018-05-26 DIAGNOSIS — D509 Iron deficiency anemia, unspecified: Secondary | ICD-10-CM | POA: Diagnosis not present

## 2018-05-26 DIAGNOSIS — N186 End stage renal disease: Secondary | ICD-10-CM | POA: Diagnosis not present

## 2018-05-26 DIAGNOSIS — D631 Anemia in chronic kidney disease: Secondary | ICD-10-CM | POA: Diagnosis not present

## 2018-05-28 ENCOUNTER — Encounter (HOSPITAL_COMMUNITY)
Admission: RE | Admit: 2018-05-28 | Discharge: 2018-05-28 | Disposition: A | Discharge status: Home / Self Care | Payer: Medicare Other | Source: Ambulatory Visit | Attending: General Surgery | Admitting: General Surgery

## 2018-05-28 ENCOUNTER — Other Ambulatory Visit: Payer: Self-pay

## 2018-05-28 ENCOUNTER — Encounter (HOSPITAL_COMMUNITY): Payer: Self-pay

## 2018-05-28 DIAGNOSIS — D631 Anemia in chronic kidney disease: Secondary | ICD-10-CM | POA: Diagnosis not present

## 2018-05-28 DIAGNOSIS — Z992 Dependence on renal dialysis: Secondary | ICD-10-CM | POA: Diagnosis not present

## 2018-05-28 DIAGNOSIS — D509 Iron deficiency anemia, unspecified: Secondary | ICD-10-CM | POA: Diagnosis not present

## 2018-05-28 DIAGNOSIS — N186 End stage renal disease: Secondary | ICD-10-CM | POA: Diagnosis not present

## 2018-05-28 DIAGNOSIS — N2581 Secondary hyperparathyroidism of renal origin: Secondary | ICD-10-CM | POA: Diagnosis not present

## 2018-05-28 NOTE — Pre-Procedure Instructions (Signed)
**Note Erica-Identified via Obfuscation** Called [name], patient's legal guardian, she will come by day surgery and sign patient's consent. Her contact information is [phone], ext [phone]. Patient is a resident at [facility], [name] will be bringing her for procedure. I called [name] at [phone] and went over patients time of arrival, NPO except medications and instructed her on what meds to have [name] take(see preop phone call). [name] verbalized understanding of this.

## 2018-05-31 DIAGNOSIS — D509 Iron deficiency anemia, unspecified: Secondary | ICD-10-CM | POA: Diagnosis not present

## 2018-05-31 DIAGNOSIS — N2581 Secondary hyperparathyroidism of renal origin: Secondary | ICD-10-CM | POA: Diagnosis not present

## 2018-05-31 DIAGNOSIS — D631 Anemia in chronic kidney disease: Secondary | ICD-10-CM | POA: Diagnosis not present

## 2018-05-31 DIAGNOSIS — Z992 Dependence on renal dialysis: Secondary | ICD-10-CM | POA: Diagnosis not present

## 2018-05-31 DIAGNOSIS — N186 End stage renal disease: Secondary | ICD-10-CM | POA: Diagnosis not present

## 2018-06-01 ENCOUNTER — Encounter (HOSPITAL_COMMUNITY): Payer: Self-pay | Admitting: Anesthesiology

## 2018-06-01 ENCOUNTER — Ambulatory Visit (HOSPITAL_COMMUNITY): Payer: Medicare Other | Admitting: Anesthesiology

## 2018-06-01 ENCOUNTER — Ambulatory Visit (HOSPITAL_COMMUNITY)
Admission: AD | Admit: 2018-06-01 | Discharge: 2018-06-01 | Disposition: A | Discharge status: Home / Self Care | Payer: Medicare Other | Attending: General Surgery | Admitting: General Surgery

## 2018-06-01 ENCOUNTER — Encounter (HOSPITAL_COMMUNITY)
Admission: AD | Disposition: A | Discharge status: Home / Self Care | Payer: Self-pay | Source: Home / Self Care | Attending: General Surgery

## 2018-06-01 ENCOUNTER — Other Ambulatory Visit: Payer: Self-pay

## 2018-06-01 DIAGNOSIS — M329 Systemic lupus erythematosus, unspecified: Secondary | ICD-10-CM | POA: Insufficient documentation

## 2018-06-01 DIAGNOSIS — F17210 Nicotine dependence, cigarettes, uncomplicated: Secondary | ICD-10-CM | POA: Insufficient documentation

## 2018-06-01 DIAGNOSIS — Z86718 Personal history of other venous thrombosis and embolism: Secondary | ICD-10-CM | POA: Diagnosis not present

## 2018-06-01 DIAGNOSIS — K219 Gastro-esophageal reflux disease without esophagitis: Secondary | ICD-10-CM | POA: Diagnosis not present

## 2018-06-01 DIAGNOSIS — Z79899 Other long term (current) drug therapy: Secondary | ICD-10-CM | POA: Diagnosis not present

## 2018-06-01 DIAGNOSIS — Z7982 Long term (current) use of aspirin: Secondary | ICD-10-CM | POA: Insufficient documentation

## 2018-06-01 DIAGNOSIS — N186 End stage renal disease: Secondary | ICD-10-CM | POA: Insufficient documentation

## 2018-06-01 DIAGNOSIS — Z992 Dependence on renal dialysis: Secondary | ICD-10-CM | POA: Insufficient documentation

## 2018-06-01 DIAGNOSIS — D1779 Benign lipomatous neoplasm of other sites: Secondary | ICD-10-CM | POA: Diagnosis not present

## 2018-06-01 DIAGNOSIS — D1720 Benign lipomatous neoplasm of skin and subcutaneous tissue of unspecified limb: Secondary | ICD-10-CM

## 2018-06-01 DIAGNOSIS — I120 Hypertensive chronic kidney disease with stage 5 chronic kidney disease or end stage renal disease: Secondary | ICD-10-CM | POA: Insufficient documentation

## 2018-06-01 DIAGNOSIS — Z7902 Long term (current) use of antithrombotics/antiplatelets: Secondary | ICD-10-CM | POA: Diagnosis not present

## 2018-06-01 DIAGNOSIS — F329 Major depressive disorder, single episode, unspecified: Secondary | ICD-10-CM | POA: Diagnosis not present

## 2018-06-01 DIAGNOSIS — I10 Essential (primary) hypertension: Secondary | ICD-10-CM | POA: Diagnosis not present

## 2018-06-01 DIAGNOSIS — D1724 Benign lipomatous neoplasm of skin and subcutaneous tissue of left leg: Secondary | ICD-10-CM | POA: Insufficient documentation

## 2018-06-01 DIAGNOSIS — F17200 Nicotine dependence, unspecified, uncomplicated: Secondary | ICD-10-CM | POA: Diagnosis not present

## 2018-06-01 DIAGNOSIS — R2242 Localized swelling, mass and lump, left lower limb: Secondary | ICD-10-CM | POA: Diagnosis present

## 2018-06-01 HISTORY — PX: MASS EXCISION: SHX2000

## 2018-06-01 LAB — COMPREHENSIVE METABOLIC PANEL
ALBUMIN: 3.9 g/dL (ref 3.5–5.0)
ALT: 20 U/L (ref 0–44)
AST: 32 U/L (ref 15–41)
Alkaline Phosphatase: 120 U/L (ref 38–126)
Anion gap: 15 (ref 5–15)
BILIRUBIN TOTAL: 0.7 mg/dL (ref 0.3–1.2)
BUN: 23 mg/dL — ABNORMAL HIGH (ref 6–20)
CO2: 26 mmol/L (ref 22–32)
Calcium: 8.6 mg/dL — ABNORMAL LOW (ref 8.9–10.3)
Chloride: 97 mmol/L — ABNORMAL LOW (ref 98–111)
Creatinine, Ser: 6.45 mg/dL — ABNORMAL HIGH (ref 0.44–1.00)
GFR calc Af Amer: 8 mL/min — ABNORMAL LOW (ref >60)
GFR calc non Af Amer: 7 mL/min — ABNORMAL LOW (ref >60)
Glucose, Bld: 70 mg/dL (ref 70–99)
POTASSIUM: 5.1 mmol/L (ref 3.5–5.1)
Sodium: 138 mmol/L (ref 135–145)
Total Protein: 7.6 g/dL (ref 6.5–8.1)

## 2018-06-01 LAB — CBC
HCT: 36.3 % (ref 36.0–46.0)
Hemoglobin: 12.3 g/dL (ref 12.0–15.0)
MCH: 30.3 pg (ref 26.0–34.0)
MCHC: 33.9 g/dL (ref 30.0–36.0)
MCV: 89.4 fL (ref 80.0–100.0)
Platelets: 180 10*3/uL (ref 150–400)
RBC: 4.06 MIL/uL (ref 3.87–5.11)
RDW: 14.4 % (ref 11.5–15.5)

## 2018-06-01 LAB — HCG, SERUM, QUALITATIVE: Preg, Serum: NEGATIVE

## 2018-06-01 SURGERY — EXCISION MASS
Anesthesia: General | Site: Thigh | Laterality: Left

## 2018-06-01 MED ORDER — HYDROMORPHONE HCL 1 MG/ML IJ SOLN: 0.2500 mg | INTRAMUSCULAR | Status: DC | PRN

## 2018-06-01 MED ORDER — KETOROLAC TROMETHAMINE 30 MG/ML IJ SOLN
30.0000 mg | Freq: Once | INTRAMUSCULAR | Status: AC
  Administered 2018-06-01: 30 mg via INTRAVENOUS
  Filled 2018-06-01: qty 1

## 2018-06-01 MED ORDER — BUPIVACAINE HCL (PF) 0.5 % IJ SOLN
INTRAMUSCULAR | Status: DC | PRN
  Administered 2018-06-01: 8 mL

## 2018-06-01 MED ORDER — LIDOCAINE 2% (20 MG/ML) 5 ML SYRINGE
INTRAMUSCULAR | Status: AC
  Filled 2018-06-01: qty 10

## 2018-06-01 MED ORDER — GLYCOPYRROLATE PF 0.2 MG/ML IJ SOSY
PREFILLED_SYRINGE | INTRAMUSCULAR | Status: AC
  Filled 2018-06-01: qty 1

## 2018-06-01 MED ORDER — PROPOFOL 10 MG/ML IV BOLUS
INTRAVENOUS | Status: DC | PRN
  Administered 2018-06-01: 120 mg via INTRAVENOUS

## 2018-06-01 MED ORDER — FENTANYL CITRATE (PF) 250 MCG/5ML IJ SOLN
INTRAMUSCULAR | Status: AC
  Filled 2018-06-01: qty 5

## 2018-06-01 MED ORDER — HYDROCODONE-ACETAMINOPHEN 5-325 MG PO TABS: 1.0000 | ORAL_TABLET | Freq: Four times a day (QID) | ORAL | 0 refills | Status: DC | PRN

## 2018-06-01 MED ORDER — CEFAZOLIN SODIUM-DEXTROSE 2-4 GM/100ML-% IV SOLN
2.0000 g | INTRAVENOUS | Status: AC
  Administered 2018-06-01: 2 g via INTRAVENOUS
  Filled 2018-06-01: qty 100

## 2018-06-01 MED ORDER — LACTATED RINGERS IV SOLN
INTRAVENOUS | Status: DC
  Administered 2018-06-01: 11:00:00 via INTRAVENOUS

## 2018-06-01 MED ORDER — CHLORHEXIDINE GLUCONATE CLOTH 2 % EX PADS: 6.0000 | MEDICATED_PAD | Freq: Once | CUTANEOUS | Status: DC

## 2018-06-01 MED ORDER — MIDAZOLAM HCL 2 MG/2ML IJ SOLN: 0.5000 mg | Freq: Once | INTRAMUSCULAR | Status: DC | PRN

## 2018-06-01 MED ORDER — PROMETHAZINE HCL 25 MG/ML IJ SOLN: 6.2500 mg | INTRAMUSCULAR | Status: DC | PRN

## 2018-06-01 MED ORDER — 0.9 % SODIUM CHLORIDE (POUR BTL) OPTIME
TOPICAL | Status: DC | PRN
  Administered 2018-06-01: 1000 mL

## 2018-06-01 MED ORDER — HYDROCODONE-ACETAMINOPHEN 7.5-325 MG PO TABS: 1.0000 | ORAL_TABLET | Freq: Once | ORAL | Status: DC | PRN

## 2018-06-01 MED ORDER — PROPOFOL 10 MG/ML IV BOLUS
INTRAVENOUS | Status: AC
  Filled 2018-06-01: qty 20

## 2018-06-01 MED ORDER — SUCCINYLCHOLINE CHLORIDE 200 MG/10ML IV SOSY
PREFILLED_SYRINGE | INTRAVENOUS | Status: AC
  Filled 2018-06-01: qty 20

## 2018-06-01 MED ORDER — FENTANYL CITRATE (PF) 100 MCG/2ML IJ SOLN
INTRAMUSCULAR | Status: DC | PRN
  Administered 2018-06-01: 25 ug via INTRAVENOUS

## 2018-06-01 MED ORDER — BUPIVACAINE HCL (PF) 0.5 % IJ SOLN
INTRAMUSCULAR | Status: AC
  Filled 2018-06-01: qty 30

## 2018-06-01 MED ORDER — EPHEDRINE SULFATE 50 MG/ML IJ SOLN
INTRAMUSCULAR | Status: DC | PRN
  Administered 2018-06-01: 10 mg via INTRAVENOUS

## 2018-06-01 SURGICAL SUPPLY — 32 items
ADH SKN CLS APL DERMABOND .7 (GAUZE/BANDAGES/DRESSINGS) ×1
BLADE SURG SZ11 CARB STEEL (BLADE) IMPLANT
CHLORAPREP W/TINT 10.5 ML (MISCELLANEOUS) ×2 IMPLANT
CLOTH BEACON ORANGE TIMEOUT ST (SAFETY) ×2 IMPLANT
COVER LIGHT HANDLE STERIS (MISCELLANEOUS) ×4 IMPLANT
COVER WAND RF STERILE (DRAPES) ×1 IMPLANT
DECANTER SPIKE VIAL GLASS SM (MISCELLANEOUS) ×2 IMPLANT
DERMABOND ADVANCED (GAUZE/BANDAGES/DRESSINGS) ×1
DERMABOND ADVANCED .7 DNX12 (GAUZE/BANDAGES/DRESSINGS) IMPLANT
ELECT NDL TIP 2.8 STRL (NEEDLE) IMPLANT
ELECT NEEDLE TIP 2.8 STRL (NEEDLE) ×2 IMPLANT
ELECT REM PT RETURN 9FT ADLT (ELECTROSURGICAL) ×2
ELECTRODE REM PT RTRN 9FT ADLT (ELECTROSURGICAL) ×1 IMPLANT
GAUZE SPONGE 4X4 12PLY STRL (GAUZE/BANDAGES/DRESSINGS) ×2 IMPLANT
GLOVE BIOGEL PI IND STRL 7.0 (GLOVE) ×2 IMPLANT
GLOVE BIOGEL PI INDICATOR 7.0 (GLOVE) ×2
GLOVE SURG SS PI 7.5 STRL IVOR (GLOVE) ×2 IMPLANT
GOWN STRL REUS W/ TWL XL LVL3 (GOWN DISPOSABLE) ×1 IMPLANT
GOWN STRL REUS W/TWL LRG LVL3 (GOWN DISPOSABLE) ×2 IMPLANT
GOWN STRL REUS W/TWL XL LVL3 (GOWN DISPOSABLE) ×2
KIT TURNOVER KIT A (KITS) ×2 IMPLANT
MANIFOLD NEPTUNE II (INSTRUMENTS) ×2 IMPLANT
NDL HYPO 25X1 1.5 SAFETY (NEEDLE) ×1 IMPLANT
NEEDLE HYPO 25X1 1.5 SAFETY (NEEDLE) ×2 IMPLANT
NS IRRIG 1000ML POUR BTL (IV SOLUTION) ×2 IMPLANT
PACK MINOR (CUSTOM PROCEDURE TRAY) ×2 IMPLANT
PAD ARMBOARD 7.5X6 YLW CONV (MISCELLANEOUS) ×2 IMPLANT
SET BASIN LINEN APH (SET/KITS/TRAYS/PACK) ×2 IMPLANT
SUT MNCRL AB 4-0 PS2 18 (SUTURE) ×1 IMPLANT
SUT VIC AB 3-0 SH 27 (SUTURE) ×2
SUT VIC AB 3-0 SH 27X BRD (SUTURE) IMPLANT
SYR CONTROL 10ML LL (SYRINGE) ×2 IMPLANT

## 2018-06-01 NOTE — Anesthesia Preprocedure Evaluation (Signed)
Anesthesia Evaluation  Patient identified by MRN, date of birth, ID band Patient awake    Reviewed: Allergy & Precautions, NPO status , Patient's Chart, lab work & pertinent test results  Airway Mallampati: II  TM Distance: >3 FB Neck ROM: Full    Dental no notable dental hx. (+) Poor Dentition   Pulmonary neg pulmonary ROS, Current Smoker,  Smoked today   Pulmonary exam normal breath sounds clear to auscultation       Cardiovascular Exercise Tolerance: Poor hypertension, Pt. on medications negative cardio ROS Normal cardiovascular examII Rhythm:Regular Rate:Normal     Neuro/Psych  Headaches, Depression  Neuromuscular disease negative psych ROS   GI/Hepatic Neg liver ROS, GERD  Medicated and Controlled,  Endo/Other  negative endocrine ROS  Renal/GU ESRF and DialysisRenal disease  negative genitourinary   Musculoskeletal  (+) Arthritis ,   Abdominal   Peds negative pediatric ROS (+)  Hematology negative hematology ROS (+) anemia ,   Anesthesia Other Findings   Reproductive/Obstetrics negative OB ROS                             Anesthesia Physical Anesthesia Plan  ASA: III  Anesthesia Plan: General   Post-op Pain Management:    Induction: Intravenous  PONV Risk Score and Plan:   Airway Management Planned: LMA and Oral ETT  Additional Equipment:   Intra-op Plan:   Post-operative Plan:   Informed Consent: I have reviewed the patients History and Physical, chart, labs and discussed the procedure including the risks, benefits and alternatives for the proposed anesthesia with the patient or authorized representative who has indicated his/her understanding and acceptance.     Dental advisory given  Plan Discussed with: CRNA  Anesthesia Plan Comments: (LMA )        Anesthesia Quick Evaluation

## 2018-06-01 NOTE — Discharge Instructions (Signed)
PATIENT INSTRUCTIONS POST-ANESTHESIA  IMMEDIATELY FOLLOWING SURGERY:  Do not drive or operate machinery for the first twenty four hours after surgery.  Do not make any important decisions for twenty four hours after surgery or while taking narcotic pain medications or sedatives.  If you develop intractable nausea and vomiting or a severe headache please notify your doctor immediately.  FOLLOW-UP:  Please make an appointment with your surgeon as instructed. You do not need to follow up with anesthesia unless specifically instructed to do so.  WOUND CARE INSTRUCTIONS (if applicable):  Keep a dry clean dressing on the anesthesia/puncture wound site if there is drainage.  Once the wound has quit draining you may leave it open to air.  Generally you should leave the bandage intact for twenty four hours unless there is drainage.  If the epidural site drains for more than 36-48 hours please call the anesthesia department.  QUESTIONS?:  Please feel free to call your physician or the hospital operator if you have any questions, and they will be happy to assist you.      Wound Care, Adult Taking care of your wound properly can help to prevent pain, infection, and scarring. It can also help your wound to heal more quickly. How to care for your wound Wound care      Follow instructions from your health care provider about how to take care of your wound. Make sure you: ? Wash your hands with soap and water before you change the bandage (dressing). If soap and water are not available, use hand sanitizer. ? Change your dressing as told by your health care provider. ? Leave stitches (sutures), skin glue, or adhesive strips in place. These skin closures may need to stay in place for 2 weeks or longer. If adhesive strip edges start to loosen and curl up, you may trim the loose edges. Do not remove adhesive strips completely unless your health care provider tells you to do that.  Check your wound area every  day for signs of infection. Check for: ? Redness, swelling, or pain. ? Fluid or blood. ? Warmth. ? Pus or a bad smell.  Ask your health care provider if you should clean the wound with mild soap and water. Doing this may include: ? Using a clean towel to pat the wound dry after cleaning it. Do not rub or scrub the wound. ? Applying a cream or ointment. Do this only as told by your health care provider. ? Covering the incision with a clean dressing.  Ask your health care provider when you can leave the wound uncovered.  Keep the dressing dry until your health care provider says it can be removed. Do not take baths, swim, use a hot tub, or do anything that would put the wound underwater until your health care provider approves. Ask your health care provider if you can take showers. You may only be allowed to take sponge baths. Medicines   If you were prescribed an antibiotic medicine, cream, or ointment, take or use the antibiotic as told by your health care provider. Do not stop taking or using the antibiotic even if your condition improves.  Take over-the-counter and prescription medicines only as told by your health care provider. If you were prescribed pain medicine, take it 30 or more minutes before you do any wound care or as told by your health care provider. General instructions  Return to your normal activities as told by your health care provider. Ask your health care  provider what activities are safe.  Do not scratch or pick at the wound.  Do not use any products that contain nicotine or tobacco, such as cigarettes and e-cigarettes. These may delay wound healing. If you need help quitting, ask your health care provider.  Keep all follow-up visits as told by your health care provider. This is important.  Eat a diet that includes protein, vitamin A, vitamin C, and other nutrient-rich foods to help the wound heal. ? Foods rich in protein include meat, dairy, beans, nuts, and  other sources. ? Foods rich in vitamin A include carrots and dark green, leafy vegetables. ? Foods rich in vitamin C include citrus, tomatoes, and other fruits and vegetables. ? Nutrient-rich foods have protein, carbohydrates, fat, vitamins, or minerals. Eat a variety of healthy foods including vegetables, fruits, and whole grains. Contact a health care provider if:  You received a tetanus shot and you have swelling, severe pain, redness, or bleeding at the injection site.  Your pain is not controlled with medicine.  You have redness, swelling, or pain around the wound.  You have fluid or blood coming from the wound.  Your wound feels warm to the touch.  You have pus or a bad smell coming from the wound.  You have a fever or chills.  You are nauseous or you vomit.  You are dizzy. Get help right away if:  You have a red streak going away from your wound.  The edges of the wound open up and separate.  Your wound is bleeding, and the bleeding does not stop with gentle pressure.  You have a rash.  You faint.  You have trouble breathing. Summary  Always wash your hands with soap and water before changing your bandage (dressing).  To help with healing, eat foods that are rich in protein, vitamin A, vitamin C, and other nutrients.  Check your wound every day for signs of infection. Contact your health care provider if you suspect that your wound is infected. This information is not intended to replace advice given to you by your health care provider. Make sure you discuss any questions you have with your health care provider. Document Released: 12/11/2007 Document Revised: 04/14/2017 Document Reviewed: 09/18/2015 Elsevier Interactive Patient Education  2019 [employer]       Lipoma Removal, Care After Refer to this sheet in the next few weeks. These instructions provide you with information about caring for yourself after your procedure. Your health care provider may  also give you more specific instructions. Your treatment has been planned according to current medical practices, but problems sometimes occur. Call your health care provider if you have any problems or questions after your procedure. What can I expect after the procedure? After the procedure, it is common to have:  Mild pain.  Swelling.  Bruising. Follow these instructions at home:  Bathing  Do not take baths, swim, or use a hot tub until your health care provider approves. Ask your health care provider if you can take showers. You may only be allowed to take sponge baths for bathing.  Keep your bandage (dressing) dry until your health care provider says it can be removed. Incision care   Follow instructions from your health care provider about how to take care of your incision. Make sure you: ? Wash your hands with soap and water before you change your bandage (dressing). If soap and water are not available, use hand sanitizer. ? Change your dressing as told by your  health care provider. ? Leave stitches (sutures), skin glue, or adhesive strips in place. These skin closures may need to stay in place for 2 weeks or longer. If adhesive strip edges start to loosen and curl up, you may trim the loose edges. Do not remove adhesive strips completely unless your health care provider tells you to do that.  Check your incision area every day for signs of infection. Check for: ? More redness, swelling, or pain. ? Fluid or blood. ? Warmth. ? Pus or a bad smell. Driving  Do not drive or operate heavy machinery while taking prescription pain medicine.  Do not drive for 24 hours if you received a medicine to help you relax (sedative) during your procedure.  Ask your health care provider when it is safe for you to drive. General instructions  Take over-the-counter and prescription medicines only as told by your health care provider.  Do not use any tobacco products, such as cigarettes,  chewing tobacco, and e-cigarettes. These can delay healing. If you need help quitting, ask your health care provider.  Return to your normal activities as told by your health care provider. Ask your health care provider what activities are safe for you.  Keep all follow-up visits as told by your health care provider. This is important. Contact a health care provider if:  You have more redness, swelling, or pain around your incision.  You have fluid or blood coming from your incision.  Your incision feels warm to the touch.  You have pus or a bad smell coming from your incision.  You have pain that does not get better with medicine. Get help right away if:  You have chills or a fever.  You have severe pain. This information is not intended to replace advice given to you by your health care provider. Make sure you discuss any questions you have with your health care provider. Document Released: 05/17/2015 Document Revised: 08/14/2015 Document Reviewed: 05/17/2015 Elsevier Interactive Patient Education  2019 [employer].

## 2018-06-01 NOTE — Transfer of Care (Signed)
Immediate Anesthesia Transfer of Care Note  Patient: [name]  Procedure(s) Performed: EXCISION 5 X 3CM LIPOMA LEFT THIGH (Left Thigh)  Patient Location: PACU  Anesthesia Type:General  Level of Consciousness: awake, alert  and patient cooperative  Airway & Oxygen Therapy: Patient Spontanous Breathing  Post-op Assessment: Report given to RN and Post -op Vital signs reviewed and stable  Post vital signs: Reviewed and stable  Last Vitals:  Vitals Value Taken Time  BP 132/59 06/01/2018 11:00 AM  Temp    Pulse 63 06/01/2018 11:03 AM  Resp 15 06/01/2018 11:03 AM  SpO2 100 % 06/01/2018 11:03 AM  Vitals shown include unvalidated device data.  Last Pain:  Vitals:   06/01/18 0854  TempSrc: Oral  PainSc: 0-No pain      Patients Stated Pain Goal: 7 (50/53/97 [id number])  Complications: No apparent anesthesia complications

## 2018-06-01 NOTE — Op Note (Signed)
Patient:  [name]  DOB:  09/24/1975  MRN:  [id number]   Preop Diagnosis: Tender subcutaneous mass, left thigh  Postop Diagnosis: Same, lipoma left thigh  Procedure: Excision of 6 cm lipoma, left thigh  Surgeon: Aviva Signs, MD  Anes: General  Indications: Patient is a 43 year old black female with multiple medical problems who presents with an enlarging tender subcutaneous mass on the left thigh.  The risks and benefits of the procedure were fully explained to the patient and her power of attorney, who gave informed consent for the patient as the patient is a ward of the state.  Procedure note: The patient was placed in supine position.  After general anesthesia was administered, the left anterior thigh was prepped and draped using the usual sterile technique with ChloraPrep.  Surgical site confirmation was performed.  A longitudinal incision was made over the subcutaneous mass.  A lipoma was found.  It was 6 cm in its greatest diameter.  It was excised without difficulty.  A bleeding was controlled using Bovie electrocautery.  0.5% Marcaine was instilled into the surrounding wound.  The subcutaneous layer was reapproximated using 3-0 Vicryl interrupted suture.  The skin was closed using a 4-0 Monocryl subcuticular suture.  Dermabond was applied.  All tape and needle counts were correct at the end of the procedure.  The patient was awakened and transferred to PACU in stable condition.  Complications: None  EBL: Minimal  Specimen: Lipoma, left thigh

## 2018-06-01 NOTE — Anesthesia Procedure Notes (Signed)
Procedure Name: LMA Insertion Date/Time: 06/01/2018 10:33 AM Performed by: Vista Deck, CRNA Pre-anesthesia Checklist: Patient identified, Patient being monitored, Emergency Drugs available, Timeout performed and Suction available Patient Re-evaluated:Patient Re-evaluated prior to induction Oxygen Delivery Method: Circle System Utilized Preoxygenation: Pre-oxygenation with 100% oxygen Induction Type: IV induction Ventilation: Mask ventilation without difficulty LMA: LMA inserted LMA Size: 3.0 Number of attempts: 1 Placement Confirmation: positive ETCO2 and breath sounds checked- equal and bilateral Tube secured with: Tape Dental Injury: Teeth and Oropharynx as per pre-operative assessment

## 2018-06-01 NOTE — Interval H&P Note (Signed)
History and Physical Interval Note:  06/01/2018 9:25 AM  [name]  has presented today for surgery, with the diagnosis of 5 X 3 cm lipoma.  The various methods of treatment have been discussed with the patient and family. After consideration of risks, benefits and other options for treatment, the patient has consented to  Procedure(s): EXCISION 5 X 3CM LIPOMA LEFT THIGH (Left) as a surgical intervention.  The patient's history has been reviewed, patient examined, no change in status, stable for surgery.  I have reviewed the patient's chart and labs.  Questions were answered to the patient's satisfaction.     Aviva Signs

## 2018-06-01 NOTE — Anesthesia Postprocedure Evaluation (Signed)
Anesthesia Post Note  Patient: Aaleigha Bozza  Procedure(s) Performed: EXCISION 5 X 3CM LIPOMA LEFT THIGH (Left Thigh)  Patient location during evaluation: PACU Anesthesia Type: General Level of consciousness: awake and alert and patient cooperative Pain management: satisfactory to patient Vital Signs Assessment: post-procedure vital signs reviewed and stable Respiratory status: patient connected to nasal cannula oxygen Cardiovascular status: stable Postop Assessment: no apparent nausea or vomiting Anesthetic complications: no     Last Vitals:  Vitals:   06/01/18 1115 06/01/18 1130  BP: 127/68   Pulse: (!) 59 (!) 58  Resp: (!) 8 (!) 7  Temp:    SpO2: 100% 99%    Last Pain:  Vitals:   06/01/18 1100  TempSrc:   PainSc: 7                  Nesta Scaturro

## 2018-06-02 ENCOUNTER — Encounter (HOSPITAL_COMMUNITY): Payer: Self-pay | Admitting: General Surgery

## 2018-06-02 DIAGNOSIS — D631 Anemia in chronic kidney disease: Secondary | ICD-10-CM | POA: Diagnosis not present

## 2018-06-02 DIAGNOSIS — D509 Iron deficiency anemia, unspecified: Secondary | ICD-10-CM | POA: Diagnosis not present

## 2018-06-02 DIAGNOSIS — N186 End stage renal disease: Secondary | ICD-10-CM | POA: Diagnosis not present

## 2018-06-02 DIAGNOSIS — N2581 Secondary hyperparathyroidism of renal origin: Secondary | ICD-10-CM | POA: Diagnosis not present

## 2018-06-02 DIAGNOSIS — Z992 Dependence on renal dialysis: Secondary | ICD-10-CM | POA: Diagnosis not present

## 2018-06-04 DIAGNOSIS — N2581 Secondary hyperparathyroidism of renal origin: Secondary | ICD-10-CM | POA: Diagnosis not present

## 2018-06-04 DIAGNOSIS — N186 End stage renal disease: Secondary | ICD-10-CM | POA: Diagnosis not present

## 2018-06-04 DIAGNOSIS — D509 Iron deficiency anemia, unspecified: Secondary | ICD-10-CM | POA: Diagnosis not present

## 2018-06-04 DIAGNOSIS — D631 Anemia in chronic kidney disease: Secondary | ICD-10-CM | POA: Diagnosis not present

## 2018-06-04 DIAGNOSIS — Z992 Dependence on renal dialysis: Secondary | ICD-10-CM | POA: Diagnosis not present

## 2018-06-07 DIAGNOSIS — N186 End stage renal disease: Secondary | ICD-10-CM | POA: Diagnosis not present

## 2018-06-07 DIAGNOSIS — D631 Anemia in chronic kidney disease: Secondary | ICD-10-CM | POA: Diagnosis not present

## 2018-06-07 DIAGNOSIS — D509 Iron deficiency anemia, unspecified: Secondary | ICD-10-CM | POA: Diagnosis not present

## 2018-06-07 DIAGNOSIS — N2581 Secondary hyperparathyroidism of renal origin: Secondary | ICD-10-CM | POA: Diagnosis not present

## 2018-06-07 DIAGNOSIS — Z992 Dependence on renal dialysis: Secondary | ICD-10-CM | POA: Diagnosis not present

## 2018-06-09 DIAGNOSIS — Z992 Dependence on renal dialysis: Secondary | ICD-10-CM | POA: Diagnosis not present

## 2018-06-09 DIAGNOSIS — D509 Iron deficiency anemia, unspecified: Secondary | ICD-10-CM | POA: Diagnosis not present

## 2018-06-09 DIAGNOSIS — D631 Anemia in chronic kidney disease: Secondary | ICD-10-CM | POA: Diagnosis not present

## 2018-06-09 DIAGNOSIS — N2581 Secondary hyperparathyroidism of renal origin: Secondary | ICD-10-CM | POA: Diagnosis not present

## 2018-06-09 DIAGNOSIS — N186 End stage renal disease: Secondary | ICD-10-CM | POA: Diagnosis not present

## 2018-06-10 ENCOUNTER — Ambulatory Visit: Payer: Self-pay | Admitting: General Surgery

## 2018-06-11 DIAGNOSIS — N2581 Secondary hyperparathyroidism of renal origin: Secondary | ICD-10-CM | POA: Diagnosis not present

## 2018-06-11 DIAGNOSIS — D631 Anemia in chronic kidney disease: Secondary | ICD-10-CM | POA: Diagnosis not present

## 2018-06-11 DIAGNOSIS — N186 End stage renal disease: Secondary | ICD-10-CM | POA: Diagnosis not present

## 2018-06-11 DIAGNOSIS — Z992 Dependence on renal dialysis: Secondary | ICD-10-CM | POA: Diagnosis not present

## 2018-06-11 DIAGNOSIS — D509 Iron deficiency anemia, unspecified: Secondary | ICD-10-CM | POA: Diagnosis not present

## 2018-06-14 ENCOUNTER — Encounter (HOSPITAL_COMMUNITY): Payer: Self-pay

## 2018-06-14 DIAGNOSIS — D631 Anemia in chronic kidney disease: Secondary | ICD-10-CM | POA: Diagnosis not present

## 2018-06-14 DIAGNOSIS — N2581 Secondary hyperparathyroidism of renal origin: Secondary | ICD-10-CM | POA: Diagnosis not present

## 2018-06-14 DIAGNOSIS — D509 Iron deficiency anemia, unspecified: Secondary | ICD-10-CM | POA: Diagnosis not present

## 2018-06-14 DIAGNOSIS — N186 End stage renal disease: Secondary | ICD-10-CM | POA: Diagnosis not present

## 2018-06-14 DIAGNOSIS — Z992 Dependence on renal dialysis: Secondary | ICD-10-CM | POA: Diagnosis not present

## 2018-06-15 DIAGNOSIS — N186 End stage renal disease: Secondary | ICD-10-CM | POA: Diagnosis not present

## 2018-06-15 DIAGNOSIS — Z992 Dependence on renal dialysis: Secondary | ICD-10-CM | POA: Diagnosis not present

## 2018-06-16 DIAGNOSIS — D509 Iron deficiency anemia, unspecified: Secondary | ICD-10-CM | POA: Diagnosis not present

## 2018-06-16 DIAGNOSIS — N2581 Secondary hyperparathyroidism of renal origin: Secondary | ICD-10-CM | POA: Diagnosis not present

## 2018-06-16 DIAGNOSIS — Z992 Dependence on renal dialysis: Secondary | ICD-10-CM | POA: Diagnosis not present

## 2018-06-16 DIAGNOSIS — D631 Anemia in chronic kidney disease: Secondary | ICD-10-CM | POA: Diagnosis not present

## 2018-06-16 DIAGNOSIS — N186 End stage renal disease: Secondary | ICD-10-CM | POA: Diagnosis not present

## 2018-06-18 DIAGNOSIS — D631 Anemia in chronic kidney disease: Secondary | ICD-10-CM | POA: Diagnosis not present

## 2018-06-18 DIAGNOSIS — Z992 Dependence on renal dialysis: Secondary | ICD-10-CM | POA: Diagnosis not present

## 2018-06-18 DIAGNOSIS — N2581 Secondary hyperparathyroidism of renal origin: Secondary | ICD-10-CM | POA: Diagnosis not present

## 2018-06-18 DIAGNOSIS — D509 Iron deficiency anemia, unspecified: Secondary | ICD-10-CM | POA: Diagnosis not present

## 2018-06-18 DIAGNOSIS — N186 End stage renal disease: Secondary | ICD-10-CM | POA: Diagnosis not present

## 2018-06-21 DIAGNOSIS — D509 Iron deficiency anemia, unspecified: Secondary | ICD-10-CM | POA: Diagnosis not present

## 2018-06-21 DIAGNOSIS — Z992 Dependence on renal dialysis: Secondary | ICD-10-CM | POA: Diagnosis not present

## 2018-06-21 DIAGNOSIS — N2581 Secondary hyperparathyroidism of renal origin: Secondary | ICD-10-CM | POA: Diagnosis not present

## 2018-06-21 DIAGNOSIS — N186 End stage renal disease: Secondary | ICD-10-CM | POA: Diagnosis not present

## 2018-06-21 DIAGNOSIS — D631 Anemia in chronic kidney disease: Secondary | ICD-10-CM | POA: Diagnosis not present

## 2018-06-23 DIAGNOSIS — D509 Iron deficiency anemia, unspecified: Secondary | ICD-10-CM | POA: Diagnosis not present

## 2018-06-23 DIAGNOSIS — D631 Anemia in chronic kidney disease: Secondary | ICD-10-CM | POA: Diagnosis not present

## 2018-06-23 DIAGNOSIS — N186 End stage renal disease: Secondary | ICD-10-CM | POA: Diagnosis not present

## 2018-06-23 DIAGNOSIS — Z992 Dependence on renal dialysis: Secondary | ICD-10-CM | POA: Diagnosis not present

## 2018-06-23 DIAGNOSIS — N2581 Secondary hyperparathyroidism of renal origin: Secondary | ICD-10-CM | POA: Diagnosis not present

## 2018-06-25 DIAGNOSIS — Z992 Dependence on renal dialysis: Secondary | ICD-10-CM | POA: Diagnosis not present

## 2018-06-25 DIAGNOSIS — N186 End stage renal disease: Secondary | ICD-10-CM | POA: Diagnosis not present

## 2018-06-25 DIAGNOSIS — N2581 Secondary hyperparathyroidism of renal origin: Secondary | ICD-10-CM | POA: Diagnosis not present

## 2018-06-25 DIAGNOSIS — D509 Iron deficiency anemia, unspecified: Secondary | ICD-10-CM | POA: Diagnosis not present

## 2018-06-25 DIAGNOSIS — D631 Anemia in chronic kidney disease: Secondary | ICD-10-CM | POA: Diagnosis not present

## 2018-06-28 DIAGNOSIS — N2581 Secondary hyperparathyroidism of renal origin: Secondary | ICD-10-CM | POA: Diagnosis not present

## 2018-06-28 DIAGNOSIS — N186 End stage renal disease: Secondary | ICD-10-CM | POA: Diagnosis not present

## 2018-06-28 DIAGNOSIS — D509 Iron deficiency anemia, unspecified: Secondary | ICD-10-CM | POA: Diagnosis not present

## 2018-06-28 DIAGNOSIS — D631 Anemia in chronic kidney disease: Secondary | ICD-10-CM | POA: Diagnosis not present

## 2018-06-28 DIAGNOSIS — E119 Type 2 diabetes mellitus without complications: Secondary | ICD-10-CM | POA: Diagnosis not present

## 2018-06-28 DIAGNOSIS — Z992 Dependence on renal dialysis: Secondary | ICD-10-CM | POA: Diagnosis not present

## 2018-06-30 DIAGNOSIS — N2581 Secondary hyperparathyroidism of renal origin: Secondary | ICD-10-CM | POA: Diagnosis not present

## 2018-06-30 DIAGNOSIS — D509 Iron deficiency anemia, unspecified: Secondary | ICD-10-CM | POA: Diagnosis not present

## 2018-06-30 DIAGNOSIS — D631 Anemia in chronic kidney disease: Secondary | ICD-10-CM | POA: Diagnosis not present

## 2018-06-30 DIAGNOSIS — Z992 Dependence on renal dialysis: Secondary | ICD-10-CM | POA: Diagnosis not present

## 2018-06-30 DIAGNOSIS — N186 End stage renal disease: Secondary | ICD-10-CM | POA: Diagnosis not present

## 2018-07-02 DIAGNOSIS — N2581 Secondary hyperparathyroidism of renal origin: Secondary | ICD-10-CM | POA: Diagnosis not present

## 2018-07-02 DIAGNOSIS — N186 End stage renal disease: Secondary | ICD-10-CM | POA: Diagnosis not present

## 2018-07-02 DIAGNOSIS — D509 Iron deficiency anemia, unspecified: Secondary | ICD-10-CM | POA: Diagnosis not present

## 2018-07-02 DIAGNOSIS — D631 Anemia in chronic kidney disease: Secondary | ICD-10-CM | POA: Diagnosis not present

## 2018-07-02 DIAGNOSIS — Z992 Dependence on renal dialysis: Secondary | ICD-10-CM | POA: Diagnosis not present

## 2018-07-05 DIAGNOSIS — D631 Anemia in chronic kidney disease: Secondary | ICD-10-CM | POA: Diagnosis not present

## 2018-07-05 DIAGNOSIS — D509 Iron deficiency anemia, unspecified: Secondary | ICD-10-CM | POA: Diagnosis not present

## 2018-07-05 DIAGNOSIS — N186 End stage renal disease: Secondary | ICD-10-CM | POA: Diagnosis not present

## 2018-07-05 DIAGNOSIS — N2581 Secondary hyperparathyroidism of renal origin: Secondary | ICD-10-CM | POA: Diagnosis not present

## 2018-07-05 DIAGNOSIS — Z992 Dependence on renal dialysis: Secondary | ICD-10-CM | POA: Diagnosis not present

## 2018-07-07 DIAGNOSIS — Z992 Dependence on renal dialysis: Secondary | ICD-10-CM | POA: Diagnosis not present

## 2018-07-07 DIAGNOSIS — D509 Iron deficiency anemia, unspecified: Secondary | ICD-10-CM | POA: Diagnosis not present

## 2018-07-07 DIAGNOSIS — N2581 Secondary hyperparathyroidism of renal origin: Secondary | ICD-10-CM | POA: Diagnosis not present

## 2018-07-07 DIAGNOSIS — D631 Anemia in chronic kidney disease: Secondary | ICD-10-CM | POA: Diagnosis not present

## 2018-07-07 DIAGNOSIS — N186 End stage renal disease: Secondary | ICD-10-CM | POA: Diagnosis not present

## 2018-07-09 DIAGNOSIS — N2581 Secondary hyperparathyroidism of renal origin: Secondary | ICD-10-CM | POA: Diagnosis not present

## 2018-07-09 DIAGNOSIS — D631 Anemia in chronic kidney disease: Secondary | ICD-10-CM | POA: Diagnosis not present

## 2018-07-09 DIAGNOSIS — D509 Iron deficiency anemia, unspecified: Secondary | ICD-10-CM | POA: Diagnosis not present

## 2018-07-09 DIAGNOSIS — Z992 Dependence on renal dialysis: Secondary | ICD-10-CM | POA: Diagnosis not present

## 2018-07-09 DIAGNOSIS — N186 End stage renal disease: Secondary | ICD-10-CM | POA: Diagnosis not present

## 2018-07-12 DIAGNOSIS — N186 End stage renal disease: Secondary | ICD-10-CM | POA: Diagnosis not present

## 2018-07-12 DIAGNOSIS — D509 Iron deficiency anemia, unspecified: Secondary | ICD-10-CM | POA: Diagnosis not present

## 2018-07-12 DIAGNOSIS — D631 Anemia in chronic kidney disease: Secondary | ICD-10-CM | POA: Diagnosis not present

## 2018-07-12 DIAGNOSIS — Z992 Dependence on renal dialysis: Secondary | ICD-10-CM | POA: Diagnosis not present

## 2018-07-12 DIAGNOSIS — N2581 Secondary hyperparathyroidism of renal origin: Secondary | ICD-10-CM | POA: Diagnosis not present

## 2018-07-14 DIAGNOSIS — N2581 Secondary hyperparathyroidism of renal origin: Secondary | ICD-10-CM | POA: Diagnosis not present

## 2018-07-14 DIAGNOSIS — N186 End stage renal disease: Secondary | ICD-10-CM | POA: Diagnosis not present

## 2018-07-14 DIAGNOSIS — Z992 Dependence on renal dialysis: Secondary | ICD-10-CM | POA: Diagnosis not present

## 2018-07-14 DIAGNOSIS — D509 Iron deficiency anemia, unspecified: Secondary | ICD-10-CM | POA: Diagnosis not present

## 2018-07-14 DIAGNOSIS — D631 Anemia in chronic kidney disease: Secondary | ICD-10-CM | POA: Diagnosis not present

## 2018-07-15 DIAGNOSIS — Z992 Dependence on renal dialysis: Secondary | ICD-10-CM | POA: Diagnosis not present

## 2018-07-15 DIAGNOSIS — N186 End stage renal disease: Secondary | ICD-10-CM | POA: Diagnosis not present

## 2018-07-16 DIAGNOSIS — D631 Anemia in chronic kidney disease: Secondary | ICD-10-CM | POA: Diagnosis not present

## 2018-07-16 DIAGNOSIS — N186 End stage renal disease: Secondary | ICD-10-CM | POA: Diagnosis not present

## 2018-07-16 DIAGNOSIS — D509 Iron deficiency anemia, unspecified: Secondary | ICD-10-CM | POA: Diagnosis not present

## 2018-07-16 DIAGNOSIS — N2581 Secondary hyperparathyroidism of renal origin: Secondary | ICD-10-CM | POA: Diagnosis not present

## 2018-07-16 DIAGNOSIS — Z992 Dependence on renal dialysis: Secondary | ICD-10-CM | POA: Diagnosis not present

## 2018-07-19 DIAGNOSIS — D631 Anemia in chronic kidney disease: Secondary | ICD-10-CM | POA: Diagnosis not present

## 2018-07-19 DIAGNOSIS — N2581 Secondary hyperparathyroidism of renal origin: Secondary | ICD-10-CM | POA: Diagnosis not present

## 2018-07-19 DIAGNOSIS — Z992 Dependence on renal dialysis: Secondary | ICD-10-CM | POA: Diagnosis not present

## 2018-07-19 DIAGNOSIS — D509 Iron deficiency anemia, unspecified: Secondary | ICD-10-CM | POA: Diagnosis not present

## 2018-07-19 DIAGNOSIS — N186 End stage renal disease: Secondary | ICD-10-CM | POA: Diagnosis not present

## 2018-07-21 DIAGNOSIS — D631 Anemia in chronic kidney disease: Secondary | ICD-10-CM | POA: Diagnosis not present

## 2018-07-21 DIAGNOSIS — D509 Iron deficiency anemia, unspecified: Secondary | ICD-10-CM | POA: Diagnosis not present

## 2018-07-21 DIAGNOSIS — N186 End stage renal disease: Secondary | ICD-10-CM | POA: Diagnosis not present

## 2018-07-21 DIAGNOSIS — N2581 Secondary hyperparathyroidism of renal origin: Secondary | ICD-10-CM | POA: Diagnosis not present

## 2018-07-21 DIAGNOSIS — Z992 Dependence on renal dialysis: Secondary | ICD-10-CM | POA: Diagnosis not present

## 2018-07-23 DIAGNOSIS — N2581 Secondary hyperparathyroidism of renal origin: Secondary | ICD-10-CM | POA: Diagnosis not present

## 2018-07-23 DIAGNOSIS — D631 Anemia in chronic kidney disease: Secondary | ICD-10-CM | POA: Diagnosis not present

## 2018-07-23 DIAGNOSIS — N186 End stage renal disease: Secondary | ICD-10-CM | POA: Diagnosis not present

## 2018-07-23 DIAGNOSIS — D509 Iron deficiency anemia, unspecified: Secondary | ICD-10-CM | POA: Diagnosis not present

## 2018-07-23 DIAGNOSIS — Z992 Dependence on renal dialysis: Secondary | ICD-10-CM | POA: Diagnosis not present

## 2018-07-26 DIAGNOSIS — D509 Iron deficiency anemia, unspecified: Secondary | ICD-10-CM | POA: Diagnosis not present

## 2018-07-26 DIAGNOSIS — D631 Anemia in chronic kidney disease: Secondary | ICD-10-CM | POA: Diagnosis not present

## 2018-07-26 DIAGNOSIS — Z992 Dependence on renal dialysis: Secondary | ICD-10-CM | POA: Diagnosis not present

## 2018-07-26 DIAGNOSIS — N186 End stage renal disease: Secondary | ICD-10-CM | POA: Diagnosis not present

## 2018-07-26 DIAGNOSIS — N2581 Secondary hyperparathyroidism of renal origin: Secondary | ICD-10-CM | POA: Diagnosis not present

## 2018-07-28 DIAGNOSIS — N2581 Secondary hyperparathyroidism of renal origin: Secondary | ICD-10-CM | POA: Diagnosis not present

## 2018-07-28 DIAGNOSIS — D631 Anemia in chronic kidney disease: Secondary | ICD-10-CM | POA: Diagnosis not present

## 2018-07-28 DIAGNOSIS — Z992 Dependence on renal dialysis: Secondary | ICD-10-CM | POA: Diagnosis not present

## 2018-07-28 DIAGNOSIS — D509 Iron deficiency anemia, unspecified: Secondary | ICD-10-CM | POA: Diagnosis not present

## 2018-07-28 DIAGNOSIS — N186 End stage renal disease: Secondary | ICD-10-CM | POA: Diagnosis not present

## 2018-07-30 DIAGNOSIS — D631 Anemia in chronic kidney disease: Secondary | ICD-10-CM | POA: Diagnosis not present

## 2018-07-30 DIAGNOSIS — D509 Iron deficiency anemia, unspecified: Secondary | ICD-10-CM | POA: Diagnosis not present

## 2018-07-30 DIAGNOSIS — Z992 Dependence on renal dialysis: Secondary | ICD-10-CM | POA: Diagnosis not present

## 2018-07-30 DIAGNOSIS — N2581 Secondary hyperparathyroidism of renal origin: Secondary | ICD-10-CM | POA: Diagnosis not present

## 2018-07-30 DIAGNOSIS — N186 End stage renal disease: Secondary | ICD-10-CM | POA: Diagnosis not present

## 2018-08-02 DIAGNOSIS — D509 Iron deficiency anemia, unspecified: Secondary | ICD-10-CM | POA: Diagnosis not present

## 2018-08-02 DIAGNOSIS — N2581 Secondary hyperparathyroidism of renal origin: Secondary | ICD-10-CM | POA: Diagnosis not present

## 2018-08-02 DIAGNOSIS — Z992 Dependence on renal dialysis: Secondary | ICD-10-CM | POA: Diagnosis not present

## 2018-08-02 DIAGNOSIS — D631 Anemia in chronic kidney disease: Secondary | ICD-10-CM | POA: Diagnosis not present

## 2018-08-02 DIAGNOSIS — N186 End stage renal disease: Secondary | ICD-10-CM | POA: Diagnosis not present

## 2018-08-04 DIAGNOSIS — D509 Iron deficiency anemia, unspecified: Secondary | ICD-10-CM | POA: Diagnosis not present

## 2018-08-04 DIAGNOSIS — N2581 Secondary hyperparathyroidism of renal origin: Secondary | ICD-10-CM | POA: Diagnosis not present

## 2018-08-04 DIAGNOSIS — N186 End stage renal disease: Secondary | ICD-10-CM | POA: Diagnosis not present

## 2018-08-04 DIAGNOSIS — Z992 Dependence on renal dialysis: Secondary | ICD-10-CM | POA: Diagnosis not present

## 2018-08-04 DIAGNOSIS — D631 Anemia in chronic kidney disease: Secondary | ICD-10-CM | POA: Diagnosis not present

## 2018-08-06 DIAGNOSIS — N186 End stage renal disease: Secondary | ICD-10-CM | POA: Diagnosis not present

## 2018-08-06 DIAGNOSIS — D509 Iron deficiency anemia, unspecified: Secondary | ICD-10-CM | POA: Diagnosis not present

## 2018-08-06 DIAGNOSIS — Z992 Dependence on renal dialysis: Secondary | ICD-10-CM | POA: Diagnosis not present

## 2018-08-06 DIAGNOSIS — D631 Anemia in chronic kidney disease: Secondary | ICD-10-CM | POA: Diagnosis not present

## 2018-08-06 DIAGNOSIS — N2581 Secondary hyperparathyroidism of renal origin: Secondary | ICD-10-CM | POA: Diagnosis not present

## 2018-08-09 DIAGNOSIS — D509 Iron deficiency anemia, unspecified: Secondary | ICD-10-CM | POA: Diagnosis not present

## 2018-08-09 DIAGNOSIS — D631 Anemia in chronic kidney disease: Secondary | ICD-10-CM | POA: Diagnosis not present

## 2018-08-09 DIAGNOSIS — N2581 Secondary hyperparathyroidism of renal origin: Secondary | ICD-10-CM | POA: Diagnosis not present

## 2018-08-09 DIAGNOSIS — Z992 Dependence on renal dialysis: Secondary | ICD-10-CM | POA: Diagnosis not present

## 2018-08-09 DIAGNOSIS — N186 End stage renal disease: Secondary | ICD-10-CM | POA: Diagnosis not present

## 2018-08-11 DIAGNOSIS — D509 Iron deficiency anemia, unspecified: Secondary | ICD-10-CM | POA: Diagnosis not present

## 2018-08-11 DIAGNOSIS — N186 End stage renal disease: Secondary | ICD-10-CM | POA: Diagnosis not present

## 2018-08-11 DIAGNOSIS — Z992 Dependence on renal dialysis: Secondary | ICD-10-CM | POA: Diagnosis not present

## 2018-08-11 DIAGNOSIS — N2581 Secondary hyperparathyroidism of renal origin: Secondary | ICD-10-CM | POA: Diagnosis not present

## 2018-08-11 DIAGNOSIS — D631 Anemia in chronic kidney disease: Secondary | ICD-10-CM | POA: Diagnosis not present

## 2018-08-13 DIAGNOSIS — N186 End stage renal disease: Secondary | ICD-10-CM | POA: Diagnosis not present

## 2018-08-13 DIAGNOSIS — Z992 Dependence on renal dialysis: Secondary | ICD-10-CM | POA: Diagnosis not present

## 2018-08-13 DIAGNOSIS — N2581 Secondary hyperparathyroidism of renal origin: Secondary | ICD-10-CM | POA: Diagnosis not present

## 2018-08-13 DIAGNOSIS — D509 Iron deficiency anemia, unspecified: Secondary | ICD-10-CM | POA: Diagnosis not present

## 2018-08-13 DIAGNOSIS — D631 Anemia in chronic kidney disease: Secondary | ICD-10-CM | POA: Diagnosis not present

## 2018-08-15 DIAGNOSIS — N186 End stage renal disease: Secondary | ICD-10-CM | POA: Diagnosis not present

## 2018-08-15 DIAGNOSIS — Z992 Dependence on renal dialysis: Secondary | ICD-10-CM | POA: Diagnosis not present

## 2018-08-16 DIAGNOSIS — N186 End stage renal disease: Secondary | ICD-10-CM | POA: Diagnosis not present

## 2018-08-16 DIAGNOSIS — N2581 Secondary hyperparathyroidism of renal origin: Secondary | ICD-10-CM | POA: Diagnosis not present

## 2018-08-16 DIAGNOSIS — Z992 Dependence on renal dialysis: Secondary | ICD-10-CM | POA: Diagnosis not present

## 2018-08-16 DIAGNOSIS — D509 Iron deficiency anemia, unspecified: Secondary | ICD-10-CM | POA: Diagnosis not present

## 2018-08-18 DIAGNOSIS — D509 Iron deficiency anemia, unspecified: Secondary | ICD-10-CM | POA: Diagnosis not present

## 2018-08-18 DIAGNOSIS — Z992 Dependence on renal dialysis: Secondary | ICD-10-CM | POA: Diagnosis not present

## 2018-08-18 DIAGNOSIS — N186 End stage renal disease: Secondary | ICD-10-CM | POA: Diagnosis not present

## 2018-08-18 DIAGNOSIS — N2581 Secondary hyperparathyroidism of renal origin: Secondary | ICD-10-CM | POA: Diagnosis not present

## 2018-08-20 DIAGNOSIS — D509 Iron deficiency anemia, unspecified: Secondary | ICD-10-CM | POA: Diagnosis not present

## 2018-08-20 DIAGNOSIS — N2581 Secondary hyperparathyroidism of renal origin: Secondary | ICD-10-CM | POA: Diagnosis not present

## 2018-08-20 DIAGNOSIS — N186 End stage renal disease: Secondary | ICD-10-CM | POA: Diagnosis not present

## 2018-08-20 DIAGNOSIS — Z992 Dependence on renal dialysis: Secondary | ICD-10-CM | POA: Diagnosis not present

## 2018-08-23 DIAGNOSIS — D509 Iron deficiency anemia, unspecified: Secondary | ICD-10-CM | POA: Diagnosis not present

## 2018-08-23 DIAGNOSIS — Z992 Dependence on renal dialysis: Secondary | ICD-10-CM | POA: Diagnosis not present

## 2018-08-23 DIAGNOSIS — N2581 Secondary hyperparathyroidism of renal origin: Secondary | ICD-10-CM | POA: Diagnosis not present

## 2018-08-23 DIAGNOSIS — N186 End stage renal disease: Secondary | ICD-10-CM | POA: Diagnosis not present

## 2018-08-25 DIAGNOSIS — Z992 Dependence on renal dialysis: Secondary | ICD-10-CM | POA: Diagnosis not present

## 2018-08-25 DIAGNOSIS — N2581 Secondary hyperparathyroidism of renal origin: Secondary | ICD-10-CM | POA: Diagnosis not present

## 2018-08-25 DIAGNOSIS — N186 End stage renal disease: Secondary | ICD-10-CM | POA: Diagnosis not present

## 2018-08-25 DIAGNOSIS — D509 Iron deficiency anemia, unspecified: Secondary | ICD-10-CM | POA: Diagnosis not present

## 2018-08-27 DIAGNOSIS — N2581 Secondary hyperparathyroidism of renal origin: Secondary | ICD-10-CM | POA: Diagnosis not present

## 2018-08-27 DIAGNOSIS — N186 End stage renal disease: Secondary | ICD-10-CM | POA: Diagnosis not present

## 2018-08-27 DIAGNOSIS — D509 Iron deficiency anemia, unspecified: Secondary | ICD-10-CM | POA: Diagnosis not present

## 2018-08-27 DIAGNOSIS — Z992 Dependence on renal dialysis: Secondary | ICD-10-CM | POA: Diagnosis not present

## 2018-08-30 DIAGNOSIS — D509 Iron deficiency anemia, unspecified: Secondary | ICD-10-CM | POA: Diagnosis not present

## 2018-08-30 DIAGNOSIS — N2581 Secondary hyperparathyroidism of renal origin: Secondary | ICD-10-CM | POA: Diagnosis not present

## 2018-08-30 DIAGNOSIS — N186 End stage renal disease: Secondary | ICD-10-CM | POA: Diagnosis not present

## 2018-08-30 DIAGNOSIS — Z992 Dependence on renal dialysis: Secondary | ICD-10-CM | POA: Diagnosis not present

## 2018-08-31 DIAGNOSIS — R634 Abnormal weight loss: Secondary | ICD-10-CM | POA: Diagnosis not present

## 2018-08-31 DIAGNOSIS — I120 Hypertensive chronic kidney disease with stage 5 chronic kidney disease or end stage renal disease: Secondary | ICD-10-CM | POA: Diagnosis not present

## 2018-08-31 DIAGNOSIS — R569 Unspecified convulsions: Secondary | ICD-10-CM | POA: Diagnosis not present

## 2018-08-31 DIAGNOSIS — F419 Anxiety disorder, unspecified: Secondary | ICD-10-CM | POA: Diagnosis not present

## 2018-09-01 DIAGNOSIS — N186 End stage renal disease: Secondary | ICD-10-CM | POA: Diagnosis not present

## 2018-09-01 DIAGNOSIS — D509 Iron deficiency anemia, unspecified: Secondary | ICD-10-CM | POA: Diagnosis not present

## 2018-09-01 DIAGNOSIS — N2581 Secondary hyperparathyroidism of renal origin: Secondary | ICD-10-CM | POA: Diagnosis not present

## 2018-09-01 DIAGNOSIS — Z992 Dependence on renal dialysis: Secondary | ICD-10-CM | POA: Diagnosis not present

## 2018-09-03 DIAGNOSIS — N186 End stage renal disease: Secondary | ICD-10-CM | POA: Diagnosis not present

## 2018-09-03 DIAGNOSIS — N2581 Secondary hyperparathyroidism of renal origin: Secondary | ICD-10-CM | POA: Diagnosis not present

## 2018-09-03 DIAGNOSIS — Z992 Dependence on renal dialysis: Secondary | ICD-10-CM | POA: Diagnosis not present

## 2018-09-03 DIAGNOSIS — D509 Iron deficiency anemia, unspecified: Secondary | ICD-10-CM | POA: Diagnosis not present

## 2018-09-06 DIAGNOSIS — N186 End stage renal disease: Secondary | ICD-10-CM | POA: Diagnosis not present

## 2018-09-06 DIAGNOSIS — Z992 Dependence on renal dialysis: Secondary | ICD-10-CM | POA: Diagnosis not present

## 2018-09-06 DIAGNOSIS — D509 Iron deficiency anemia, unspecified: Secondary | ICD-10-CM | POA: Diagnosis not present

## 2018-09-06 DIAGNOSIS — N2581 Secondary hyperparathyroidism of renal origin: Secondary | ICD-10-CM | POA: Diagnosis not present

## 2018-09-08 DIAGNOSIS — Z992 Dependence on renal dialysis: Secondary | ICD-10-CM | POA: Diagnosis not present

## 2018-09-08 DIAGNOSIS — D509 Iron deficiency anemia, unspecified: Secondary | ICD-10-CM | POA: Diagnosis not present

## 2018-09-08 DIAGNOSIS — N2581 Secondary hyperparathyroidism of renal origin: Secondary | ICD-10-CM | POA: Diagnosis not present

## 2018-09-08 DIAGNOSIS — N186 End stage renal disease: Secondary | ICD-10-CM | POA: Diagnosis not present

## 2018-09-10 DIAGNOSIS — N2581 Secondary hyperparathyroidism of renal origin: Secondary | ICD-10-CM | POA: Diagnosis not present

## 2018-09-10 DIAGNOSIS — Z992 Dependence on renal dialysis: Secondary | ICD-10-CM | POA: Diagnosis not present

## 2018-09-10 DIAGNOSIS — D509 Iron deficiency anemia, unspecified: Secondary | ICD-10-CM | POA: Diagnosis not present

## 2018-09-10 DIAGNOSIS — N186 End stage renal disease: Secondary | ICD-10-CM | POA: Diagnosis not present

## 2018-09-13 DIAGNOSIS — N2581 Secondary hyperparathyroidism of renal origin: Secondary | ICD-10-CM | POA: Diagnosis not present

## 2018-09-13 DIAGNOSIS — Z992 Dependence on renal dialysis: Secondary | ICD-10-CM | POA: Diagnosis not present

## 2018-09-13 DIAGNOSIS — N186 End stage renal disease: Secondary | ICD-10-CM | POA: Diagnosis not present

## 2018-09-13 DIAGNOSIS — D509 Iron deficiency anemia, unspecified: Secondary | ICD-10-CM | POA: Diagnosis not present

## 2018-09-14 DIAGNOSIS — Z992 Dependence on renal dialysis: Secondary | ICD-10-CM | POA: Diagnosis not present

## 2018-09-14 DIAGNOSIS — N186 End stage renal disease: Secondary | ICD-10-CM | POA: Diagnosis not present

## 2018-09-15 DIAGNOSIS — D509 Iron deficiency anemia, unspecified: Secondary | ICD-10-CM | POA: Diagnosis not present

## 2018-09-15 DIAGNOSIS — N186 End stage renal disease: Secondary | ICD-10-CM | POA: Diagnosis not present

## 2018-09-15 DIAGNOSIS — N2581 Secondary hyperparathyroidism of renal origin: Secondary | ICD-10-CM | POA: Diagnosis not present

## 2018-09-15 DIAGNOSIS — Z992 Dependence on renal dialysis: Secondary | ICD-10-CM | POA: Diagnosis not present

## 2018-09-15 DIAGNOSIS — D631 Anemia in chronic kidney disease: Secondary | ICD-10-CM | POA: Diagnosis not present

## 2018-09-17 DIAGNOSIS — N186 End stage renal disease: Secondary | ICD-10-CM | POA: Diagnosis not present

## 2018-09-17 DIAGNOSIS — D509 Iron deficiency anemia, unspecified: Secondary | ICD-10-CM | POA: Diagnosis not present

## 2018-09-17 DIAGNOSIS — D631 Anemia in chronic kidney disease: Secondary | ICD-10-CM | POA: Diagnosis not present

## 2018-09-17 DIAGNOSIS — N2581 Secondary hyperparathyroidism of renal origin: Secondary | ICD-10-CM | POA: Diagnosis not present

## 2018-09-17 DIAGNOSIS — Z992 Dependence on renal dialysis: Secondary | ICD-10-CM | POA: Diagnosis not present

## 2018-09-20 DIAGNOSIS — D631 Anemia in chronic kidney disease: Secondary | ICD-10-CM | POA: Diagnosis not present

## 2018-09-20 DIAGNOSIS — D509 Iron deficiency anemia, unspecified: Secondary | ICD-10-CM | POA: Diagnosis not present

## 2018-09-20 DIAGNOSIS — N2581 Secondary hyperparathyroidism of renal origin: Secondary | ICD-10-CM | POA: Diagnosis not present

## 2018-09-20 DIAGNOSIS — N186 End stage renal disease: Secondary | ICD-10-CM | POA: Diagnosis not present

## 2018-09-20 DIAGNOSIS — Z992 Dependence on renal dialysis: Secondary | ICD-10-CM | POA: Diagnosis not present

## 2018-09-22 DIAGNOSIS — N2581 Secondary hyperparathyroidism of renal origin: Secondary | ICD-10-CM | POA: Diagnosis not present

## 2018-09-22 DIAGNOSIS — Z992 Dependence on renal dialysis: Secondary | ICD-10-CM | POA: Diagnosis not present

## 2018-09-22 DIAGNOSIS — D509 Iron deficiency anemia, unspecified: Secondary | ICD-10-CM | POA: Diagnosis not present

## 2018-09-22 DIAGNOSIS — D631 Anemia in chronic kidney disease: Secondary | ICD-10-CM | POA: Diagnosis not present

## 2018-09-22 DIAGNOSIS — N186 End stage renal disease: Secondary | ICD-10-CM | POA: Diagnosis not present

## 2018-09-24 DIAGNOSIS — N186 End stage renal disease: Secondary | ICD-10-CM | POA: Diagnosis not present

## 2018-09-24 DIAGNOSIS — Z992 Dependence on renal dialysis: Secondary | ICD-10-CM | POA: Diagnosis not present

## 2018-09-24 DIAGNOSIS — N2581 Secondary hyperparathyroidism of renal origin: Secondary | ICD-10-CM | POA: Diagnosis not present

## 2018-09-24 DIAGNOSIS — D509 Iron deficiency anemia, unspecified: Secondary | ICD-10-CM | POA: Diagnosis not present

## 2018-09-24 DIAGNOSIS — D631 Anemia in chronic kidney disease: Secondary | ICD-10-CM | POA: Diagnosis not present

## 2018-09-27 DIAGNOSIS — Z992 Dependence on renal dialysis: Secondary | ICD-10-CM | POA: Diagnosis not present

## 2018-09-27 DIAGNOSIS — E119 Type 2 diabetes mellitus without complications: Secondary | ICD-10-CM | POA: Diagnosis not present

## 2018-09-27 DIAGNOSIS — D631 Anemia in chronic kidney disease: Secondary | ICD-10-CM | POA: Diagnosis not present

## 2018-09-27 DIAGNOSIS — D509 Iron deficiency anemia, unspecified: Secondary | ICD-10-CM | POA: Diagnosis not present

## 2018-09-27 DIAGNOSIS — N2581 Secondary hyperparathyroidism of renal origin: Secondary | ICD-10-CM | POA: Diagnosis not present

## 2018-09-27 DIAGNOSIS — N186 End stage renal disease: Secondary | ICD-10-CM | POA: Diagnosis not present

## 2018-09-29 DIAGNOSIS — D509 Iron deficiency anemia, unspecified: Secondary | ICD-10-CM | POA: Diagnosis not present

## 2018-09-29 DIAGNOSIS — D631 Anemia in chronic kidney disease: Secondary | ICD-10-CM | POA: Diagnosis not present

## 2018-09-29 DIAGNOSIS — N2581 Secondary hyperparathyroidism of renal origin: Secondary | ICD-10-CM | POA: Diagnosis not present

## 2018-09-29 DIAGNOSIS — Z992 Dependence on renal dialysis: Secondary | ICD-10-CM | POA: Diagnosis not present

## 2018-09-29 DIAGNOSIS — N186 End stage renal disease: Secondary | ICD-10-CM | POA: Diagnosis not present

## 2018-10-01 DIAGNOSIS — N186 End stage renal disease: Secondary | ICD-10-CM | POA: Diagnosis not present

## 2018-10-01 DIAGNOSIS — D631 Anemia in chronic kidney disease: Secondary | ICD-10-CM | POA: Diagnosis not present

## 2018-10-01 DIAGNOSIS — N2581 Secondary hyperparathyroidism of renal origin: Secondary | ICD-10-CM | POA: Diagnosis not present

## 2018-10-01 DIAGNOSIS — D509 Iron deficiency anemia, unspecified: Secondary | ICD-10-CM | POA: Diagnosis not present

## 2018-10-01 DIAGNOSIS — Z992 Dependence on renal dialysis: Secondary | ICD-10-CM | POA: Diagnosis not present

## 2018-10-04 DIAGNOSIS — Z992 Dependence on renal dialysis: Secondary | ICD-10-CM | POA: Diagnosis not present

## 2018-10-04 DIAGNOSIS — D509 Iron deficiency anemia, unspecified: Secondary | ICD-10-CM | POA: Diagnosis not present

## 2018-10-04 DIAGNOSIS — D631 Anemia in chronic kidney disease: Secondary | ICD-10-CM | POA: Diagnosis not present

## 2018-10-04 DIAGNOSIS — N186 End stage renal disease: Secondary | ICD-10-CM | POA: Diagnosis not present

## 2018-10-04 DIAGNOSIS — N2581 Secondary hyperparathyroidism of renal origin: Secondary | ICD-10-CM | POA: Diagnosis not present

## 2018-10-06 DIAGNOSIS — Z992 Dependence on renal dialysis: Secondary | ICD-10-CM | POA: Diagnosis not present

## 2018-10-06 DIAGNOSIS — D631 Anemia in chronic kidney disease: Secondary | ICD-10-CM | POA: Diagnosis not present

## 2018-10-06 DIAGNOSIS — N2581 Secondary hyperparathyroidism of renal origin: Secondary | ICD-10-CM | POA: Diagnosis not present

## 2018-10-06 DIAGNOSIS — N186 End stage renal disease: Secondary | ICD-10-CM | POA: Diagnosis not present

## 2018-10-06 DIAGNOSIS — D509 Iron deficiency anemia, unspecified: Secondary | ICD-10-CM | POA: Diagnosis not present

## 2018-10-08 DIAGNOSIS — Z992 Dependence on renal dialysis: Secondary | ICD-10-CM | POA: Diagnosis not present

## 2018-10-08 DIAGNOSIS — D631 Anemia in chronic kidney disease: Secondary | ICD-10-CM | POA: Diagnosis not present

## 2018-10-08 DIAGNOSIS — D509 Iron deficiency anemia, unspecified: Secondary | ICD-10-CM | POA: Diagnosis not present

## 2018-10-08 DIAGNOSIS — N186 End stage renal disease: Secondary | ICD-10-CM | POA: Diagnosis not present

## 2018-10-08 DIAGNOSIS — N2581 Secondary hyperparathyroidism of renal origin: Secondary | ICD-10-CM | POA: Diagnosis not present

## 2018-10-11 DIAGNOSIS — D631 Anemia in chronic kidney disease: Secondary | ICD-10-CM | POA: Diagnosis not present

## 2018-10-11 DIAGNOSIS — N186 End stage renal disease: Secondary | ICD-10-CM | POA: Diagnosis not present

## 2018-10-11 DIAGNOSIS — Z992 Dependence on renal dialysis: Secondary | ICD-10-CM | POA: Diagnosis not present

## 2018-10-11 DIAGNOSIS — D509 Iron deficiency anemia, unspecified: Secondary | ICD-10-CM | POA: Diagnosis not present

## 2018-10-11 DIAGNOSIS — N2581 Secondary hyperparathyroidism of renal origin: Secondary | ICD-10-CM | POA: Diagnosis not present

## 2018-10-13 DIAGNOSIS — Z992 Dependence on renal dialysis: Secondary | ICD-10-CM | POA: Diagnosis not present

## 2018-10-13 DIAGNOSIS — N2581 Secondary hyperparathyroidism of renal origin: Secondary | ICD-10-CM | POA: Diagnosis not present

## 2018-10-13 DIAGNOSIS — N186 End stage renal disease: Secondary | ICD-10-CM | POA: Diagnosis not present

## 2018-10-13 DIAGNOSIS — D631 Anemia in chronic kidney disease: Secondary | ICD-10-CM | POA: Diagnosis not present

## 2018-10-13 DIAGNOSIS — D509 Iron deficiency anemia, unspecified: Secondary | ICD-10-CM | POA: Diagnosis not present

## 2018-10-15 DIAGNOSIS — D509 Iron deficiency anemia, unspecified: Secondary | ICD-10-CM | POA: Diagnosis not present

## 2018-10-15 DIAGNOSIS — N2581 Secondary hyperparathyroidism of renal origin: Secondary | ICD-10-CM | POA: Diagnosis not present

## 2018-10-15 DIAGNOSIS — Z992 Dependence on renal dialysis: Secondary | ICD-10-CM | POA: Diagnosis not present

## 2018-10-15 DIAGNOSIS — N186 End stage renal disease: Secondary | ICD-10-CM | POA: Diagnosis not present

## 2018-10-15 DIAGNOSIS — D631 Anemia in chronic kidney disease: Secondary | ICD-10-CM | POA: Diagnosis not present

## 2018-10-18 DIAGNOSIS — N2581 Secondary hyperparathyroidism of renal origin: Secondary | ICD-10-CM | POA: Diagnosis not present

## 2018-10-18 DIAGNOSIS — Z992 Dependence on renal dialysis: Secondary | ICD-10-CM | POA: Diagnosis not present

## 2018-10-18 DIAGNOSIS — D509 Iron deficiency anemia, unspecified: Secondary | ICD-10-CM | POA: Diagnosis not present

## 2018-10-18 DIAGNOSIS — N186 End stage renal disease: Secondary | ICD-10-CM | POA: Diagnosis not present

## 2018-10-20 DIAGNOSIS — D509 Iron deficiency anemia, unspecified: Secondary | ICD-10-CM | POA: Diagnosis not present

## 2018-10-20 DIAGNOSIS — Z992 Dependence on renal dialysis: Secondary | ICD-10-CM | POA: Diagnosis not present

## 2018-10-20 DIAGNOSIS — N186 End stage renal disease: Secondary | ICD-10-CM | POA: Diagnosis not present

## 2018-10-20 DIAGNOSIS — N2581 Secondary hyperparathyroidism of renal origin: Secondary | ICD-10-CM | POA: Diagnosis not present

## 2018-10-22 DIAGNOSIS — Z992 Dependence on renal dialysis: Secondary | ICD-10-CM | POA: Diagnosis not present

## 2018-10-22 DIAGNOSIS — N186 End stage renal disease: Secondary | ICD-10-CM | POA: Diagnosis not present

## 2018-10-22 DIAGNOSIS — D509 Iron deficiency anemia, unspecified: Secondary | ICD-10-CM | POA: Diagnosis not present

## 2018-10-22 DIAGNOSIS — N2581 Secondary hyperparathyroidism of renal origin: Secondary | ICD-10-CM | POA: Diagnosis not present

## 2018-10-25 DIAGNOSIS — N2581 Secondary hyperparathyroidism of renal origin: Secondary | ICD-10-CM | POA: Diagnosis not present

## 2018-10-25 DIAGNOSIS — N186 End stage renal disease: Secondary | ICD-10-CM | POA: Diagnosis not present

## 2018-10-25 DIAGNOSIS — D509 Iron deficiency anemia, unspecified: Secondary | ICD-10-CM | POA: Diagnosis not present

## 2018-10-25 DIAGNOSIS — Z992 Dependence on renal dialysis: Secondary | ICD-10-CM | POA: Diagnosis not present

## 2018-10-27 DIAGNOSIS — D509 Iron deficiency anemia, unspecified: Secondary | ICD-10-CM | POA: Diagnosis not present

## 2018-10-27 DIAGNOSIS — N186 End stage renal disease: Secondary | ICD-10-CM | POA: Diagnosis not present

## 2018-10-27 DIAGNOSIS — N2581 Secondary hyperparathyroidism of renal origin: Secondary | ICD-10-CM | POA: Diagnosis not present

## 2018-10-27 DIAGNOSIS — Z992 Dependence on renal dialysis: Secondary | ICD-10-CM | POA: Diagnosis not present

## 2018-10-29 DIAGNOSIS — Z992 Dependence on renal dialysis: Secondary | ICD-10-CM | POA: Diagnosis not present

## 2018-10-29 DIAGNOSIS — D509 Iron deficiency anemia, unspecified: Secondary | ICD-10-CM | POA: Diagnosis not present

## 2018-10-29 DIAGNOSIS — N186 End stage renal disease: Secondary | ICD-10-CM | POA: Diagnosis not present

## 2018-10-29 DIAGNOSIS — N2581 Secondary hyperparathyroidism of renal origin: Secondary | ICD-10-CM | POA: Diagnosis not present

## 2018-11-01 DIAGNOSIS — D509 Iron deficiency anemia, unspecified: Secondary | ICD-10-CM | POA: Diagnosis not present

## 2018-11-01 DIAGNOSIS — N186 End stage renal disease: Secondary | ICD-10-CM | POA: Diagnosis not present

## 2018-11-01 DIAGNOSIS — Z992 Dependence on renal dialysis: Secondary | ICD-10-CM | POA: Diagnosis not present

## 2018-11-01 DIAGNOSIS — N2581 Secondary hyperparathyroidism of renal origin: Secondary | ICD-10-CM | POA: Diagnosis not present

## 2018-11-02 ENCOUNTER — Other Ambulatory Visit: Payer: Self-pay

## 2018-11-02 ENCOUNTER — Ambulatory Visit (INDEPENDENT_AMBULATORY_CARE_PROVIDER_SITE_OTHER): Payer: Medicare Other | Admitting: Vascular Surgery

## 2018-11-02 ENCOUNTER — Encounter (INDEPENDENT_AMBULATORY_CARE_PROVIDER_SITE_OTHER): Payer: Self-pay | Admitting: Vascular Surgery

## 2018-11-02 ENCOUNTER — Ambulatory Visit (INDEPENDENT_AMBULATORY_CARE_PROVIDER_SITE_OTHER): Payer: Medicare Other

## 2018-11-02 VITALS — BP 97/67 | HR 73 | Resp 12 | Ht 64.0 in | Wt 101.0 lb

## 2018-11-02 DIAGNOSIS — I10 Essential (primary) hypertension: Secondary | ICD-10-CM | POA: Diagnosis not present

## 2018-11-02 DIAGNOSIS — Z992 Dependence on renal dialysis: Secondary | ICD-10-CM | POA: Diagnosis not present

## 2018-11-02 DIAGNOSIS — E1122 Type 2 diabetes mellitus with diabetic chronic kidney disease: Secondary | ICD-10-CM | POA: Diagnosis not present

## 2018-11-02 DIAGNOSIS — N186 End stage renal disease: Secondary | ICD-10-CM | POA: Diagnosis not present

## 2018-11-02 DIAGNOSIS — E119 Type 2 diabetes mellitus without complications: Secondary | ICD-10-CM | POA: Insufficient documentation

## 2018-11-02 NOTE — Progress Notes (Signed)
MRN : [id number]  [name] is a 43 y.o. ([birth year]) female who presents with chief complaint of  Chief Complaint  Patient presents with  . Follow-up  .  History of Present Illness: Patient returns today in follow up of her dialysis access.  We have performed a litany of interventions on this over the years and she has very limited access options.  This is a left upper arm hero graft placed off of the axillary artery. Duplex today shows her left upper arm hero graft to be widely patent without recurrent stenosis.   Current Outpatient Medications  Medication Sig Dispense Refill  . albuterol (PROVENTIL) (2.5 MG/3ML) 0.083% nebulizer solution Take 2.5 mg by nebulization 4 (four) times daily as needed for wheezing or shortness of breath.     . ALPRAZolam (XANAX) 0.25 MG tablet Take 0.25 mg by mouth 2 (two) times daily.     Marland Kitchen aspirin EC 81 MG tablet Take 81 mg by mouth daily.    . chlorhexidine (PERIDEX) 0.12 % solution Use as directed 5 mLs in the mouth or throat 2 (two) times daily.     . clopidogrel (PLAVIX) 75 MG tablet Take 75 mg by mouth daily.    Marland Kitchen docusate sodium (COLACE) 100 MG capsule Take 100 mg by mouth daily.    Marland Kitchen epoetin alfa (EPOGEN,PROCRIT) 4000 UNIT/ML injection Inject 4,000 Units into the vein See admin instructions. Mon, wed, and Friday at dialysis     . guaiFENesin (ROBITUSSIN) 100 MG/5ML liquid Take 200 mg by mouth every 6 (six) hours as needed for cough.     Marland Kitchen HYDROcodone-acetaminophen (NORCO) 5-325 MG tablet Take 1 tablet by mouth every 6 (six) hours as needed for moderate pain. 20 tablet 0  . hydroxychloroquine (PLAQUENIL) 200 MG tablet Take 200 mg by mouth daily.    Marland Kitchen ipratropium (ATROVENT) 0.02 % nebulizer solution Take 250 mcg by nebulization 4 (four) times daily as needed for wheezing or shortness of breath.     . lidocaine-prilocaine (EMLA) cream Apply 1 application topically as needed (port access).     Marland Kitchen loratadine (CLARITIN) 10 MG tablet Take 10 mg by  mouth daily as needed for allergies.    . midodrine (PROAMATINE) 10 MG tablet Take 10 mg by mouth 2 (two) times daily.     . mirtazapine (REMERON) 15 MG tablet Take 7.5 mg by mouth at bedtime.     . multivitamin (RENA-VIT) TABS tablet Take 1 tablet by mouth daily.    . pantoprazole (PROTONIX) 40 MG tablet Take 40 mg by mouth daily.    . phenytoin (DILANTIN) 100 MG ER capsule Take 100 mg by mouth 2 (two) times daily.     . sertraline (ZOLOFT) 100 MG tablet Take 200 mg by mouth daily.     . sevelamer carbonate (RENVELA) 800 MG tablet Take 800 mg by mouth See admin instructions. Take 800 mg by mouth three times daily with meals and 800 mg by mouth twice daily with snacks    . simvastatin (ZOCOR) 40 MG tablet Take 40 mg by mouth at bedtime.      No current facility-administered medications for this visit.     Past Medical History:  Diagnosis Date  . Anemia   . BV (bacterial vaginosis) 11/08/2013  . Depression   . Dialysis patient ([place])   . DVT (deep venous thrombosis) ([place])   . Hypertension   . Lupus ([place])   . Renal disorder   . Vaginal odor 11/08/2013  Past Surgical History:  Procedure Laterality Date  . A/V SHUNT INTERVENTION N/A 10/24/2016   Procedure: A/V SHUNT INTERVENTION;  Surgeon: Katha Cabal, MD;  Location: [place] [facility];  Service: Cardiovascular;  Laterality: N/A;  . A/V SHUNTOGRAM Left 12/08/2017   Procedure: A/V SHUNTOGRAM;  Surgeon: Katha Cabal, MD;  Location: [place] [facility];  Service: Cardiovascular;  Laterality: Left;  . A/V SHUNTOGRAM Left 01/28/2018   Procedure: A/V SHUNTOGRAM;  Surgeon: Algernon Huxley, MD;  Location: [place] [facility];  Service: Cardiovascular;  Laterality: Left;  . A/V SHUNTOGRAM Left 04/01/2018   Procedure: A/V SHUNTOGRAM;  Surgeon: Algernon Huxley, MD;  Location: [place] [facility];  Service: Cardiovascular;  Laterality: Left;  . AV FISTULA PLACEMENT    . DIALYSIS/PERMA CATHETER INSERTION  10/23/2016   Procedure:  DIALYSIS/PERMA CATHETER INSERTION;  Surgeon: Algernon Huxley, MD;  Location: [place] [facility];  Service: Cardiovascular;;  . IR REMOVAL TUN CV CATH W/O FL  09/22/2017  . IR THROMBECTOMY AV FISTULA W/THROMBOLYSIS INC/SHUNT/IMG LEFT Left 09/04/2017  . IR US GUIDE VASC ACCESS LEFT  09/04/2017  . MASS EXCISION Left 06/01/2018   Procedure: EXCISION 5 X 3CM LIPOMA LEFT THIGH;  Surgeon: Aviva Signs, MD;  Location: [facility];  Service: General;  Laterality: Left;  . PERIPHERAL VASCULAR CATHETERIZATION Left 01/04/2015   Procedure: A/V Shuntogram/Fistulagram;  Surgeon: Algernon Huxley, MD;  Location: [place] [facility];  Service: Cardiovascular;  Laterality: Left;  . PERIPHERAL VASCULAR CATHETERIZATION N/A 01/04/2015   Procedure: A/V Shunt Intervention;  Surgeon: Algernon Huxley, MD;  Location: [place] [facility];  Service: Cardiovascular;  Laterality: N/A;  . PERIPHERAL VASCULAR CATHETERIZATION Left 04/13/2015   Procedure: A/V Shuntogram/Fistulagram;  Surgeon: Katha Cabal, MD;  Location: [place] [facility];  Service: Cardiovascular;  Laterality: Left;  . PERIPHERAL VASCULAR CATHETERIZATION Left 04/13/2015   Procedure: A/V Shunt Intervention;  Surgeon: Katha Cabal, MD;  Location: [place] [facility];  Service: Cardiovascular;  Laterality: Left;    Social History  Substance Use Topics  . Smoking status: Current Every Day Smoker    Packs/day: 1.00    Years: 15.00    Types: Cigarettes  . Smokeless tobacco: Never Used     Comment: smokes 2 cig daily  . Alcohol use No  Lives in a group home  Family History       Family History  Problem Relation Age of Onset  . Seizures Son   . Kidney disease Mother     on dialysis  . Lupus Mother   No bleeding disorders or clotting disorders       Allergies  Allergen Reactions  . Dust Mite Extract Other (See Comments)    sneezing     REVIEW OF SYSTEMS (Negative unless checked)  Constitutional: [] ?Weight loss  [] ?Fever   [] ?Chills Cardiac: [] ?Chest pain   [] ?Chest pressure   [] ?Palpitations   [] ?Shortness of breath when laying flat   [] ?Shortness of breath at rest   [] ?Shortness of breath with exertion. Vascular:  [] ?Pain in legs with walking   [] ?Pain in legs at rest   [] ?Pain in legs when laying flat   [] ?Claudication   [] ?Pain in feet when walking  [] ?Pain in feet at rest  [] ?Pain in feet when laying flat   [] ?History of DVT   [] ?Phlebitis   [] ?Swelling in legs   [] ?Varicose veins   [] ?Non-healing ulcers Pulmonary:   [] ?Uses home oxygen   [] ?Productive cough   [] ?  Hemoptysis   [] ?Wheeze  [] ?COPD   [] ?Asthma Neurologic:  [] ?Dizziness  [] ?Blackouts   [x] ?Seizures   [x] ?History of stroke   [] ?History of TIA  [] ?Aphasia   [] ?Temporary blindness   [] ?Dysphagia   [] ?Weakness or numbness in arms   [] ?Weakness or numbness in legs Musculoskeletal:  [] ?Arthritis   [] ?Joint swelling   [] ?Joint pain   [] ?Low back pain Hematologic:  [] ?Easy bruising  [] ?Easy bleeding   [] ?Hypercoagulable state   [] ?Anemic  [] ?Hepatitis Gastrointestinal:  [] ?Blood in stool   [] ?Vomiting blood  [] ?Gastroesophageal reflux/heartburn   [] ?Difficulty swallowing. Genitourinary:  [x] ?Chronic kidney disease   [] ?Difficult urination  [] ?Frequent urination  [] ?Burning with urination   [] ?Blood in urine Skin:  [] ?Rashes   [] ?Ulcers   [] ?Wounds Psychological:  [] ?History of anxiety   [] ? History of major depression.    Physical Examination  BP 97/67 (BP Location: Right Arm, Patient Position: Sitting, Cuff Size: Normal)   Pulse 73   Resp 12   Ht 5\' 4"  (1.626 m)   Wt 101 lb (45.8 kg)   BMI 17.34 kg/m  Gen:  WD/WN, NAD Head: [place]/AT, No temporalis wasting. Ear/Nose/Throat: Hearing grossly intact, nares w/o erythema or drainage Eyes: Conjunctiva clear. Sclera non-icteric Neck: Supple.  Trachea midline Pulmonary:  Good air movement, no use of accessory muscles.  Cardiac: RRR, no JVD Vascular: Good thrill in the left upper arm hero graft Vessel  Right Left  Radial Palpable Palpable                            Musculoskeletal: M/S 5/5 throughout.  No deformity or atrophy.  No edema. Neurologic: Sensation grossly intact in extremities.  Symmetrical.  Speech is fluent.  Psychiatric: Judgment and insight are fair at best Dermatologic: No rashes or ulcers noted.  No cellulitis or open wounds.       Labs No results found for this or any previous visit (from the past 2160 hour(s)).  Radiology No results found.  Assessment/Plan Essential hypertension blood pressure control important in reducing the progression of atherosclerotic disease. On appropriate oral medications.     Diabetes ([place]) blood glucose control important in reducing the progression of atherosclerotic disease. Also, involved in wound healing. On appropriate medications.   End stage renal disease ([place]) Duplex today shows her left upper arm hero graft to be widely patent without recurrent stenosis.  This has required multiple interventions in the past and at this point, we will continue to perform 74-month surveillance follow-ups even if no stenosis is seen on duplex due to the multiple previous interventions and her limited dialysis access options.  She can contact our office with any problems in the interim.    Leotis Pain, MD  11/02/2018 11:53 AM    This note was created with Dragon medical transcription system.  Any errors from dictation are purely unintentional

## 2018-11-02 NOTE — Assessment & Plan Note (Signed)
Duplex today shows her left upper arm hero graft to be widely patent without recurrent stenosis.  This has required multiple interventions in the past and at this point, we will continue to perform 37-month surveillance follow-ups even if no stenosis is seen on duplex due to the multiple previous interventions and her limited dialysis access options.  She can contact our office with any problems in the interim.

## 2018-11-02 NOTE — Assessment & Plan Note (Signed)
blood glucose control important in reducing the progression of atherosclerotic disease. Also, involved in wound healing. On appropriate medications.  

## 2018-11-03 DIAGNOSIS — D509 Iron deficiency anemia, unspecified: Secondary | ICD-10-CM | POA: Diagnosis not present

## 2018-11-03 DIAGNOSIS — N186 End stage renal disease: Secondary | ICD-10-CM | POA: Diagnosis not present

## 2018-11-03 DIAGNOSIS — Z992 Dependence on renal dialysis: Secondary | ICD-10-CM | POA: Diagnosis not present

## 2018-11-03 DIAGNOSIS — N2581 Secondary hyperparathyroidism of renal origin: Secondary | ICD-10-CM | POA: Diagnosis not present

## 2018-11-05 DIAGNOSIS — Z992 Dependence on renal dialysis: Secondary | ICD-10-CM | POA: Diagnosis not present

## 2018-11-05 DIAGNOSIS — D509 Iron deficiency anemia, unspecified: Secondary | ICD-10-CM | POA: Diagnosis not present

## 2018-11-05 DIAGNOSIS — N2581 Secondary hyperparathyroidism of renal origin: Secondary | ICD-10-CM | POA: Diagnosis not present

## 2018-11-05 DIAGNOSIS — N186 End stage renal disease: Secondary | ICD-10-CM | POA: Diagnosis not present

## 2018-11-08 DIAGNOSIS — D509 Iron deficiency anemia, unspecified: Secondary | ICD-10-CM | POA: Diagnosis not present

## 2018-11-08 DIAGNOSIS — Z992 Dependence on renal dialysis: Secondary | ICD-10-CM | POA: Diagnosis not present

## 2018-11-08 DIAGNOSIS — N186 End stage renal disease: Secondary | ICD-10-CM | POA: Diagnosis not present

## 2018-11-08 DIAGNOSIS — N2581 Secondary hyperparathyroidism of renal origin: Secondary | ICD-10-CM | POA: Diagnosis not present

## 2018-11-10 DIAGNOSIS — N2581 Secondary hyperparathyroidism of renal origin: Secondary | ICD-10-CM | POA: Diagnosis not present

## 2018-11-10 DIAGNOSIS — N186 End stage renal disease: Secondary | ICD-10-CM | POA: Diagnosis not present

## 2018-11-10 DIAGNOSIS — D509 Iron deficiency anemia, unspecified: Secondary | ICD-10-CM | POA: Diagnosis not present

## 2018-11-10 DIAGNOSIS — Z992 Dependence on renal dialysis: Secondary | ICD-10-CM | POA: Diagnosis not present

## 2018-11-12 DIAGNOSIS — N2581 Secondary hyperparathyroidism of renal origin: Secondary | ICD-10-CM | POA: Diagnosis not present

## 2018-11-12 DIAGNOSIS — N186 End stage renal disease: Secondary | ICD-10-CM | POA: Diagnosis not present

## 2018-11-12 DIAGNOSIS — Z992 Dependence on renal dialysis: Secondary | ICD-10-CM | POA: Diagnosis not present

## 2018-11-12 DIAGNOSIS — D509 Iron deficiency anemia, unspecified: Secondary | ICD-10-CM | POA: Diagnosis not present

## 2018-11-15 DIAGNOSIS — Z992 Dependence on renal dialysis: Secondary | ICD-10-CM | POA: Diagnosis not present

## 2018-11-15 DIAGNOSIS — N186 End stage renal disease: Secondary | ICD-10-CM | POA: Diagnosis not present

## 2018-11-15 DIAGNOSIS — D509 Iron deficiency anemia, unspecified: Secondary | ICD-10-CM | POA: Diagnosis not present

## 2018-11-15 DIAGNOSIS — N2581 Secondary hyperparathyroidism of renal origin: Secondary | ICD-10-CM | POA: Diagnosis not present

## 2018-11-17 DIAGNOSIS — N2581 Secondary hyperparathyroidism of renal origin: Secondary | ICD-10-CM | POA: Diagnosis not present

## 2018-11-17 DIAGNOSIS — D631 Anemia in chronic kidney disease: Secondary | ICD-10-CM | POA: Diagnosis not present

## 2018-11-17 DIAGNOSIS — D509 Iron deficiency anemia, unspecified: Secondary | ICD-10-CM | POA: Diagnosis not present

## 2018-11-17 DIAGNOSIS — N186 End stage renal disease: Secondary | ICD-10-CM | POA: Diagnosis not present

## 2018-11-17 DIAGNOSIS — Z992 Dependence on renal dialysis: Secondary | ICD-10-CM | POA: Diagnosis not present

## 2018-11-19 DIAGNOSIS — N2581 Secondary hyperparathyroidism of renal origin: Secondary | ICD-10-CM | POA: Diagnosis not present

## 2018-11-19 DIAGNOSIS — Z992 Dependence on renal dialysis: Secondary | ICD-10-CM | POA: Diagnosis not present

## 2018-11-19 DIAGNOSIS — D509 Iron deficiency anemia, unspecified: Secondary | ICD-10-CM | POA: Diagnosis not present

## 2018-11-19 DIAGNOSIS — D631 Anemia in chronic kidney disease: Secondary | ICD-10-CM | POA: Diagnosis not present

## 2018-11-19 DIAGNOSIS — N186 End stage renal disease: Secondary | ICD-10-CM | POA: Diagnosis not present

## 2018-11-22 DIAGNOSIS — D631 Anemia in chronic kidney disease: Secondary | ICD-10-CM | POA: Diagnosis not present

## 2018-11-22 DIAGNOSIS — Z992 Dependence on renal dialysis: Secondary | ICD-10-CM | POA: Diagnosis not present

## 2018-11-22 DIAGNOSIS — N2581 Secondary hyperparathyroidism of renal origin: Secondary | ICD-10-CM | POA: Diagnosis not present

## 2018-11-22 DIAGNOSIS — D509 Iron deficiency anemia, unspecified: Secondary | ICD-10-CM | POA: Diagnosis not present

## 2018-11-22 DIAGNOSIS — N186 End stage renal disease: Secondary | ICD-10-CM | POA: Diagnosis not present

## 2018-11-24 DIAGNOSIS — D631 Anemia in chronic kidney disease: Secondary | ICD-10-CM | POA: Diagnosis not present

## 2018-11-24 DIAGNOSIS — D509 Iron deficiency anemia, unspecified: Secondary | ICD-10-CM | POA: Diagnosis not present

## 2018-11-24 DIAGNOSIS — N2581 Secondary hyperparathyroidism of renal origin: Secondary | ICD-10-CM | POA: Diagnosis not present

## 2018-11-24 DIAGNOSIS — Z992 Dependence on renal dialysis: Secondary | ICD-10-CM | POA: Diagnosis not present

## 2018-11-24 DIAGNOSIS — N186 End stage renal disease: Secondary | ICD-10-CM | POA: Diagnosis not present

## 2018-11-26 DIAGNOSIS — N186 End stage renal disease: Secondary | ICD-10-CM | POA: Diagnosis not present

## 2018-11-26 DIAGNOSIS — Z992 Dependence on renal dialysis: Secondary | ICD-10-CM | POA: Diagnosis not present

## 2018-11-26 DIAGNOSIS — D631 Anemia in chronic kidney disease: Secondary | ICD-10-CM | POA: Diagnosis not present

## 2018-11-26 DIAGNOSIS — D509 Iron deficiency anemia, unspecified: Secondary | ICD-10-CM | POA: Diagnosis not present

## 2018-11-26 DIAGNOSIS — N2581 Secondary hyperparathyroidism of renal origin: Secondary | ICD-10-CM | POA: Diagnosis not present

## 2018-11-29 DIAGNOSIS — Z992 Dependence on renal dialysis: Secondary | ICD-10-CM | POA: Diagnosis not present

## 2018-11-29 DIAGNOSIS — D509 Iron deficiency anemia, unspecified: Secondary | ICD-10-CM | POA: Diagnosis not present

## 2018-11-29 DIAGNOSIS — N186 End stage renal disease: Secondary | ICD-10-CM | POA: Diagnosis not present

## 2018-11-29 DIAGNOSIS — N2581 Secondary hyperparathyroidism of renal origin: Secondary | ICD-10-CM | POA: Diagnosis not present

## 2018-11-29 DIAGNOSIS — D631 Anemia in chronic kidney disease: Secondary | ICD-10-CM | POA: Diagnosis not present

## 2018-12-01 DIAGNOSIS — N186 End stage renal disease: Secondary | ICD-10-CM | POA: Diagnosis not present

## 2018-12-01 DIAGNOSIS — N2581 Secondary hyperparathyroidism of renal origin: Secondary | ICD-10-CM | POA: Diagnosis not present

## 2018-12-01 DIAGNOSIS — D509 Iron deficiency anemia, unspecified: Secondary | ICD-10-CM | POA: Diagnosis not present

## 2018-12-01 DIAGNOSIS — Z992 Dependence on renal dialysis: Secondary | ICD-10-CM | POA: Diagnosis not present

## 2018-12-01 DIAGNOSIS — D631 Anemia in chronic kidney disease: Secondary | ICD-10-CM | POA: Diagnosis not present

## 2018-12-03 DIAGNOSIS — Z992 Dependence on renal dialysis: Secondary | ICD-10-CM | POA: Diagnosis not present

## 2018-12-03 DIAGNOSIS — D509 Iron deficiency anemia, unspecified: Secondary | ICD-10-CM | POA: Diagnosis not present

## 2018-12-03 DIAGNOSIS — N186 End stage renal disease: Secondary | ICD-10-CM | POA: Diagnosis not present

## 2018-12-03 DIAGNOSIS — D631 Anemia in chronic kidney disease: Secondary | ICD-10-CM | POA: Diagnosis not present

## 2018-12-03 DIAGNOSIS — N2581 Secondary hyperparathyroidism of renal origin: Secondary | ICD-10-CM | POA: Diagnosis not present

## 2018-12-06 DIAGNOSIS — N2581 Secondary hyperparathyroidism of renal origin: Secondary | ICD-10-CM | POA: Diagnosis not present

## 2018-12-06 DIAGNOSIS — Z992 Dependence on renal dialysis: Secondary | ICD-10-CM | POA: Diagnosis not present

## 2018-12-06 DIAGNOSIS — N186 End stage renal disease: Secondary | ICD-10-CM | POA: Diagnosis not present

## 2018-12-06 DIAGNOSIS — D509 Iron deficiency anemia, unspecified: Secondary | ICD-10-CM | POA: Diagnosis not present

## 2018-12-06 DIAGNOSIS — D631 Anemia in chronic kidney disease: Secondary | ICD-10-CM | POA: Diagnosis not present

## 2018-12-08 DIAGNOSIS — N186 End stage renal disease: Secondary | ICD-10-CM | POA: Diagnosis not present

## 2018-12-08 DIAGNOSIS — D631 Anemia in chronic kidney disease: Secondary | ICD-10-CM | POA: Diagnosis not present

## 2018-12-08 DIAGNOSIS — Z992 Dependence on renal dialysis: Secondary | ICD-10-CM | POA: Diagnosis not present

## 2018-12-08 DIAGNOSIS — N2581 Secondary hyperparathyroidism of renal origin: Secondary | ICD-10-CM | POA: Diagnosis not present

## 2018-12-08 DIAGNOSIS — D509 Iron deficiency anemia, unspecified: Secondary | ICD-10-CM | POA: Diagnosis not present

## 2018-12-10 DIAGNOSIS — D631 Anemia in chronic kidney disease: Secondary | ICD-10-CM | POA: Diagnosis not present

## 2018-12-10 DIAGNOSIS — Z992 Dependence on renal dialysis: Secondary | ICD-10-CM | POA: Diagnosis not present

## 2018-12-10 DIAGNOSIS — D509 Iron deficiency anemia, unspecified: Secondary | ICD-10-CM | POA: Diagnosis not present

## 2018-12-10 DIAGNOSIS — N186 End stage renal disease: Secondary | ICD-10-CM | POA: Diagnosis not present

## 2018-12-10 DIAGNOSIS — N2581 Secondary hyperparathyroidism of renal origin: Secondary | ICD-10-CM | POA: Diagnosis not present

## 2018-12-13 DIAGNOSIS — N186 End stage renal disease: Secondary | ICD-10-CM | POA: Diagnosis not present

## 2018-12-13 DIAGNOSIS — D631 Anemia in chronic kidney disease: Secondary | ICD-10-CM | POA: Diagnosis not present

## 2018-12-13 DIAGNOSIS — N2581 Secondary hyperparathyroidism of renal origin: Secondary | ICD-10-CM | POA: Diagnosis not present

## 2018-12-13 DIAGNOSIS — D509 Iron deficiency anemia, unspecified: Secondary | ICD-10-CM | POA: Diagnosis not present

## 2018-12-13 DIAGNOSIS — Z992 Dependence on renal dialysis: Secondary | ICD-10-CM | POA: Diagnosis not present

## 2018-12-15 DIAGNOSIS — Z992 Dependence on renal dialysis: Secondary | ICD-10-CM | POA: Diagnosis not present

## 2018-12-15 DIAGNOSIS — D509 Iron deficiency anemia, unspecified: Secondary | ICD-10-CM | POA: Diagnosis not present

## 2018-12-15 DIAGNOSIS — N186 End stage renal disease: Secondary | ICD-10-CM | POA: Diagnosis not present

## 2018-12-15 DIAGNOSIS — D631 Anemia in chronic kidney disease: Secondary | ICD-10-CM | POA: Diagnosis not present

## 2018-12-15 DIAGNOSIS — N2581 Secondary hyperparathyroidism of renal origin: Secondary | ICD-10-CM | POA: Diagnosis not present

## 2018-12-17 DIAGNOSIS — Z992 Dependence on renal dialysis: Secondary | ICD-10-CM | POA: Diagnosis not present

## 2018-12-17 DIAGNOSIS — D509 Iron deficiency anemia, unspecified: Secondary | ICD-10-CM | POA: Diagnosis not present

## 2018-12-17 DIAGNOSIS — N186 End stage renal disease: Secondary | ICD-10-CM | POA: Diagnosis not present

## 2018-12-17 DIAGNOSIS — Z23 Encounter for immunization: Secondary | ICD-10-CM | POA: Diagnosis not present

## 2018-12-17 DIAGNOSIS — D631 Anemia in chronic kidney disease: Secondary | ICD-10-CM | POA: Diagnosis not present

## 2018-12-17 DIAGNOSIS — N2581 Secondary hyperparathyroidism of renal origin: Secondary | ICD-10-CM | POA: Diagnosis not present

## 2018-12-20 DIAGNOSIS — D509 Iron deficiency anemia, unspecified: Secondary | ICD-10-CM | POA: Diagnosis not present

## 2018-12-20 DIAGNOSIS — Z23 Encounter for immunization: Secondary | ICD-10-CM | POA: Diagnosis not present

## 2018-12-20 DIAGNOSIS — N186 End stage renal disease: Secondary | ICD-10-CM | POA: Diagnosis not present

## 2018-12-20 DIAGNOSIS — N2581 Secondary hyperparathyroidism of renal origin: Secondary | ICD-10-CM | POA: Diagnosis not present

## 2018-12-20 DIAGNOSIS — Z992 Dependence on renal dialysis: Secondary | ICD-10-CM | POA: Diagnosis not present

## 2018-12-20 DIAGNOSIS — D631 Anemia in chronic kidney disease: Secondary | ICD-10-CM | POA: Diagnosis not present

## 2018-12-22 DIAGNOSIS — D631 Anemia in chronic kidney disease: Secondary | ICD-10-CM | POA: Diagnosis not present

## 2018-12-22 DIAGNOSIS — Z992 Dependence on renal dialysis: Secondary | ICD-10-CM | POA: Diagnosis not present

## 2018-12-22 DIAGNOSIS — Z23 Encounter for immunization: Secondary | ICD-10-CM | POA: Diagnosis not present

## 2018-12-22 DIAGNOSIS — N186 End stage renal disease: Secondary | ICD-10-CM | POA: Diagnosis not present

## 2018-12-22 DIAGNOSIS — D509 Iron deficiency anemia, unspecified: Secondary | ICD-10-CM | POA: Diagnosis not present

## 2018-12-22 DIAGNOSIS — N2581 Secondary hyperparathyroidism of renal origin: Secondary | ICD-10-CM | POA: Diagnosis not present

## 2018-12-24 DIAGNOSIS — D509 Iron deficiency anemia, unspecified: Secondary | ICD-10-CM | POA: Diagnosis not present

## 2018-12-24 DIAGNOSIS — N186 End stage renal disease: Secondary | ICD-10-CM | POA: Diagnosis not present

## 2018-12-24 DIAGNOSIS — Z992 Dependence on renal dialysis: Secondary | ICD-10-CM | POA: Diagnosis not present

## 2018-12-24 DIAGNOSIS — N2581 Secondary hyperparathyroidism of renal origin: Secondary | ICD-10-CM | POA: Diagnosis not present

## 2018-12-24 DIAGNOSIS — Z23 Encounter for immunization: Secondary | ICD-10-CM | POA: Diagnosis not present

## 2018-12-24 DIAGNOSIS — D631 Anemia in chronic kidney disease: Secondary | ICD-10-CM | POA: Diagnosis not present

## 2018-12-27 DIAGNOSIS — N186 End stage renal disease: Secondary | ICD-10-CM | POA: Diagnosis not present

## 2018-12-27 DIAGNOSIS — N2581 Secondary hyperparathyroidism of renal origin: Secondary | ICD-10-CM | POA: Diagnosis not present

## 2018-12-27 DIAGNOSIS — D509 Iron deficiency anemia, unspecified: Secondary | ICD-10-CM | POA: Diagnosis not present

## 2018-12-27 DIAGNOSIS — Z23 Encounter for immunization: Secondary | ICD-10-CM | POA: Diagnosis not present

## 2018-12-27 DIAGNOSIS — Z992 Dependence on renal dialysis: Secondary | ICD-10-CM | POA: Diagnosis not present

## 2018-12-27 DIAGNOSIS — E119 Type 2 diabetes mellitus without complications: Secondary | ICD-10-CM | POA: Diagnosis not present

## 2018-12-27 DIAGNOSIS — D631 Anemia in chronic kidney disease: Secondary | ICD-10-CM | POA: Diagnosis not present

## 2018-12-29 DIAGNOSIS — Z23 Encounter for immunization: Secondary | ICD-10-CM | POA: Diagnosis not present

## 2018-12-29 DIAGNOSIS — D509 Iron deficiency anemia, unspecified: Secondary | ICD-10-CM | POA: Diagnosis not present

## 2018-12-29 DIAGNOSIS — D631 Anemia in chronic kidney disease: Secondary | ICD-10-CM | POA: Diagnosis not present

## 2018-12-29 DIAGNOSIS — N2581 Secondary hyperparathyroidism of renal origin: Secondary | ICD-10-CM | POA: Diagnosis not present

## 2018-12-29 DIAGNOSIS — N186 End stage renal disease: Secondary | ICD-10-CM | POA: Diagnosis not present

## 2018-12-29 DIAGNOSIS — Z992 Dependence on renal dialysis: Secondary | ICD-10-CM | POA: Diagnosis not present

## 2018-12-31 DIAGNOSIS — D509 Iron deficiency anemia, unspecified: Secondary | ICD-10-CM | POA: Diagnosis not present

## 2018-12-31 DIAGNOSIS — Z23 Encounter for immunization: Secondary | ICD-10-CM | POA: Diagnosis not present

## 2018-12-31 DIAGNOSIS — N186 End stage renal disease: Secondary | ICD-10-CM | POA: Diagnosis not present

## 2018-12-31 DIAGNOSIS — Z992 Dependence on renal dialysis: Secondary | ICD-10-CM | POA: Diagnosis not present

## 2018-12-31 DIAGNOSIS — N2581 Secondary hyperparathyroidism of renal origin: Secondary | ICD-10-CM | POA: Diagnosis not present

## 2018-12-31 DIAGNOSIS — D631 Anemia in chronic kidney disease: Secondary | ICD-10-CM | POA: Diagnosis not present

## 2019-01-03 DIAGNOSIS — D509 Iron deficiency anemia, unspecified: Secondary | ICD-10-CM | POA: Diagnosis not present

## 2019-01-03 DIAGNOSIS — Z23 Encounter for immunization: Secondary | ICD-10-CM | POA: Diagnosis not present

## 2019-01-03 DIAGNOSIS — N186 End stage renal disease: Secondary | ICD-10-CM | POA: Diagnosis not present

## 2019-01-03 DIAGNOSIS — N2581 Secondary hyperparathyroidism of renal origin: Secondary | ICD-10-CM | POA: Diagnosis not present

## 2019-01-03 DIAGNOSIS — Z992 Dependence on renal dialysis: Secondary | ICD-10-CM | POA: Diagnosis not present

## 2019-01-03 DIAGNOSIS — D631 Anemia in chronic kidney disease: Secondary | ICD-10-CM | POA: Diagnosis not present

## 2019-01-05 DIAGNOSIS — Z992 Dependence on renal dialysis: Secondary | ICD-10-CM | POA: Diagnosis not present

## 2019-01-05 DIAGNOSIS — N2581 Secondary hyperparathyroidism of renal origin: Secondary | ICD-10-CM | POA: Diagnosis not present

## 2019-01-05 DIAGNOSIS — D631 Anemia in chronic kidney disease: Secondary | ICD-10-CM | POA: Diagnosis not present

## 2019-01-05 DIAGNOSIS — N186 End stage renal disease: Secondary | ICD-10-CM | POA: Diagnosis not present

## 2019-01-05 DIAGNOSIS — Z23 Encounter for immunization: Secondary | ICD-10-CM | POA: Diagnosis not present

## 2019-01-05 DIAGNOSIS — D509 Iron deficiency anemia, unspecified: Secondary | ICD-10-CM | POA: Diagnosis not present

## 2019-01-07 DIAGNOSIS — Z23 Encounter for immunization: Secondary | ICD-10-CM | POA: Diagnosis not present

## 2019-01-07 DIAGNOSIS — Z992 Dependence on renal dialysis: Secondary | ICD-10-CM | POA: Diagnosis not present

## 2019-01-07 DIAGNOSIS — D631 Anemia in chronic kidney disease: Secondary | ICD-10-CM | POA: Diagnosis not present

## 2019-01-07 DIAGNOSIS — D509 Iron deficiency anemia, unspecified: Secondary | ICD-10-CM | POA: Diagnosis not present

## 2019-01-07 DIAGNOSIS — N2581 Secondary hyperparathyroidism of renal origin: Secondary | ICD-10-CM | POA: Diagnosis not present

## 2019-01-07 DIAGNOSIS — N186 End stage renal disease: Secondary | ICD-10-CM | POA: Diagnosis not present

## 2019-01-10 DIAGNOSIS — Z992 Dependence on renal dialysis: Secondary | ICD-10-CM | POA: Diagnosis not present

## 2019-01-10 DIAGNOSIS — N2581 Secondary hyperparathyroidism of renal origin: Secondary | ICD-10-CM | POA: Diagnosis not present

## 2019-01-10 DIAGNOSIS — D631 Anemia in chronic kidney disease: Secondary | ICD-10-CM | POA: Diagnosis not present

## 2019-01-10 DIAGNOSIS — N186 End stage renal disease: Secondary | ICD-10-CM | POA: Diagnosis not present

## 2019-01-10 DIAGNOSIS — D509 Iron deficiency anemia, unspecified: Secondary | ICD-10-CM | POA: Diagnosis not present

## 2019-01-10 DIAGNOSIS — Z23 Encounter for immunization: Secondary | ICD-10-CM | POA: Diagnosis not present

## 2019-01-12 DIAGNOSIS — N186 End stage renal disease: Secondary | ICD-10-CM | POA: Diagnosis not present

## 2019-01-12 DIAGNOSIS — N2581 Secondary hyperparathyroidism of renal origin: Secondary | ICD-10-CM | POA: Diagnosis not present

## 2019-01-12 DIAGNOSIS — D509 Iron deficiency anemia, unspecified: Secondary | ICD-10-CM | POA: Diagnosis not present

## 2019-01-12 DIAGNOSIS — Z992 Dependence on renal dialysis: Secondary | ICD-10-CM | POA: Diagnosis not present

## 2019-01-12 DIAGNOSIS — Z23 Encounter for immunization: Secondary | ICD-10-CM | POA: Diagnosis not present

## 2019-01-12 DIAGNOSIS — D631 Anemia in chronic kidney disease: Secondary | ICD-10-CM | POA: Diagnosis not present

## 2019-01-14 DIAGNOSIS — Z23 Encounter for immunization: Secondary | ICD-10-CM | POA: Diagnosis not present

## 2019-01-14 DIAGNOSIS — N186 End stage renal disease: Secondary | ICD-10-CM | POA: Diagnosis not present

## 2019-01-14 DIAGNOSIS — N2581 Secondary hyperparathyroidism of renal origin: Secondary | ICD-10-CM | POA: Diagnosis not present

## 2019-01-14 DIAGNOSIS — D509 Iron deficiency anemia, unspecified: Secondary | ICD-10-CM | POA: Diagnosis not present

## 2019-01-14 DIAGNOSIS — D631 Anemia in chronic kidney disease: Secondary | ICD-10-CM | POA: Diagnosis not present

## 2019-01-14 DIAGNOSIS — Z992 Dependence on renal dialysis: Secondary | ICD-10-CM | POA: Diagnosis not present

## 2019-01-15 DIAGNOSIS — N186 End stage renal disease: Secondary | ICD-10-CM | POA: Diagnosis not present

## 2019-01-15 DIAGNOSIS — Z992 Dependence on renal dialysis: Secondary | ICD-10-CM | POA: Diagnosis not present

## 2019-01-17 DIAGNOSIS — Z992 Dependence on renal dialysis: Secondary | ICD-10-CM | POA: Diagnosis not present

## 2019-01-17 DIAGNOSIS — N186 End stage renal disease: Secondary | ICD-10-CM | POA: Diagnosis not present

## 2019-01-17 DIAGNOSIS — D509 Iron deficiency anemia, unspecified: Secondary | ICD-10-CM | POA: Diagnosis not present

## 2019-01-17 DIAGNOSIS — D631 Anemia in chronic kidney disease: Secondary | ICD-10-CM | POA: Diagnosis not present

## 2019-01-17 DIAGNOSIS — N2581 Secondary hyperparathyroidism of renal origin: Secondary | ICD-10-CM | POA: Diagnosis not present

## 2019-01-17 DIAGNOSIS — E559 Vitamin D deficiency, unspecified: Secondary | ICD-10-CM | POA: Diagnosis not present

## 2019-01-19 DIAGNOSIS — D509 Iron deficiency anemia, unspecified: Secondary | ICD-10-CM | POA: Diagnosis not present

## 2019-01-19 DIAGNOSIS — Z992 Dependence on renal dialysis: Secondary | ICD-10-CM | POA: Diagnosis not present

## 2019-01-19 DIAGNOSIS — D631 Anemia in chronic kidney disease: Secondary | ICD-10-CM | POA: Diagnosis not present

## 2019-01-19 DIAGNOSIS — E559 Vitamin D deficiency, unspecified: Secondary | ICD-10-CM | POA: Diagnosis not present

## 2019-01-19 DIAGNOSIS — N2581 Secondary hyperparathyroidism of renal origin: Secondary | ICD-10-CM | POA: Diagnosis not present

## 2019-01-19 DIAGNOSIS — N186 End stage renal disease: Secondary | ICD-10-CM | POA: Diagnosis not present

## 2019-01-21 DIAGNOSIS — Z992 Dependence on renal dialysis: Secondary | ICD-10-CM | POA: Diagnosis not present

## 2019-01-21 DIAGNOSIS — D631 Anemia in chronic kidney disease: Secondary | ICD-10-CM | POA: Diagnosis not present

## 2019-01-21 DIAGNOSIS — D509 Iron deficiency anemia, unspecified: Secondary | ICD-10-CM | POA: Diagnosis not present

## 2019-01-21 DIAGNOSIS — N186 End stage renal disease: Secondary | ICD-10-CM | POA: Diagnosis not present

## 2019-01-21 DIAGNOSIS — E559 Vitamin D deficiency, unspecified: Secondary | ICD-10-CM | POA: Diagnosis not present

## 2019-01-21 DIAGNOSIS — N2581 Secondary hyperparathyroidism of renal origin: Secondary | ICD-10-CM | POA: Diagnosis not present

## 2019-01-24 DIAGNOSIS — D509 Iron deficiency anemia, unspecified: Secondary | ICD-10-CM | POA: Diagnosis not present

## 2019-01-24 DIAGNOSIS — N186 End stage renal disease: Secondary | ICD-10-CM | POA: Diagnosis not present

## 2019-01-24 DIAGNOSIS — E559 Vitamin D deficiency, unspecified: Secondary | ICD-10-CM | POA: Diagnosis not present

## 2019-01-24 DIAGNOSIS — D631 Anemia in chronic kidney disease: Secondary | ICD-10-CM | POA: Diagnosis not present

## 2019-01-24 DIAGNOSIS — Z992 Dependence on renal dialysis: Secondary | ICD-10-CM | POA: Diagnosis not present

## 2019-01-24 DIAGNOSIS — N2581 Secondary hyperparathyroidism of renal origin: Secondary | ICD-10-CM | POA: Diagnosis not present

## 2019-01-26 DIAGNOSIS — E559 Vitamin D deficiency, unspecified: Secondary | ICD-10-CM | POA: Diagnosis not present

## 2019-01-26 DIAGNOSIS — Z992 Dependence on renal dialysis: Secondary | ICD-10-CM | POA: Diagnosis not present

## 2019-01-26 DIAGNOSIS — D631 Anemia in chronic kidney disease: Secondary | ICD-10-CM | POA: Diagnosis not present

## 2019-01-26 DIAGNOSIS — D509 Iron deficiency anemia, unspecified: Secondary | ICD-10-CM | POA: Diagnosis not present

## 2019-01-26 DIAGNOSIS — N186 End stage renal disease: Secondary | ICD-10-CM | POA: Diagnosis not present

## 2019-01-26 DIAGNOSIS — N2581 Secondary hyperparathyroidism of renal origin: Secondary | ICD-10-CM | POA: Diagnosis not present

## 2019-01-28 DIAGNOSIS — Z992 Dependence on renal dialysis: Secondary | ICD-10-CM | POA: Diagnosis not present

## 2019-01-28 DIAGNOSIS — D631 Anemia in chronic kidney disease: Secondary | ICD-10-CM | POA: Diagnosis not present

## 2019-01-28 DIAGNOSIS — E559 Vitamin D deficiency, unspecified: Secondary | ICD-10-CM | POA: Diagnosis not present

## 2019-01-28 DIAGNOSIS — N186 End stage renal disease: Secondary | ICD-10-CM | POA: Diagnosis not present

## 2019-01-28 DIAGNOSIS — N2581 Secondary hyperparathyroidism of renal origin: Secondary | ICD-10-CM | POA: Diagnosis not present

## 2019-01-28 DIAGNOSIS — D509 Iron deficiency anemia, unspecified: Secondary | ICD-10-CM | POA: Diagnosis not present

## 2019-01-31 DIAGNOSIS — N186 End stage renal disease: Secondary | ICD-10-CM | POA: Diagnosis not present

## 2019-01-31 DIAGNOSIS — Z992 Dependence on renal dialysis: Secondary | ICD-10-CM | POA: Diagnosis not present

## 2019-01-31 DIAGNOSIS — E559 Vitamin D deficiency, unspecified: Secondary | ICD-10-CM | POA: Diagnosis not present

## 2019-01-31 DIAGNOSIS — N2581 Secondary hyperparathyroidism of renal origin: Secondary | ICD-10-CM | POA: Diagnosis not present

## 2019-01-31 DIAGNOSIS — D631 Anemia in chronic kidney disease: Secondary | ICD-10-CM | POA: Diagnosis not present

## 2019-01-31 DIAGNOSIS — D509 Iron deficiency anemia, unspecified: Secondary | ICD-10-CM | POA: Diagnosis not present

## 2019-02-02 DIAGNOSIS — D509 Iron deficiency anemia, unspecified: Secondary | ICD-10-CM | POA: Diagnosis not present

## 2019-02-02 DIAGNOSIS — D631 Anemia in chronic kidney disease: Secondary | ICD-10-CM | POA: Diagnosis not present

## 2019-02-02 DIAGNOSIS — N186 End stage renal disease: Secondary | ICD-10-CM | POA: Diagnosis not present

## 2019-02-02 DIAGNOSIS — N2581 Secondary hyperparathyroidism of renal origin: Secondary | ICD-10-CM | POA: Diagnosis not present

## 2019-02-02 DIAGNOSIS — E559 Vitamin D deficiency, unspecified: Secondary | ICD-10-CM | POA: Diagnosis not present

## 2019-02-02 DIAGNOSIS — Z992 Dependence on renal dialysis: Secondary | ICD-10-CM | POA: Diagnosis not present

## 2019-02-04 DIAGNOSIS — N2581 Secondary hyperparathyroidism of renal origin: Secondary | ICD-10-CM | POA: Diagnosis not present

## 2019-02-04 DIAGNOSIS — Z992 Dependence on renal dialysis: Secondary | ICD-10-CM | POA: Diagnosis not present

## 2019-02-04 DIAGNOSIS — E559 Vitamin D deficiency, unspecified: Secondary | ICD-10-CM | POA: Diagnosis not present

## 2019-02-04 DIAGNOSIS — N186 End stage renal disease: Secondary | ICD-10-CM | POA: Diagnosis not present

## 2019-02-04 DIAGNOSIS — D509 Iron deficiency anemia, unspecified: Secondary | ICD-10-CM | POA: Diagnosis not present

## 2019-02-04 DIAGNOSIS — D631 Anemia in chronic kidney disease: Secondary | ICD-10-CM | POA: Diagnosis not present

## 2019-02-07 DIAGNOSIS — E559 Vitamin D deficiency, unspecified: Secondary | ICD-10-CM | POA: Diagnosis not present

## 2019-02-07 DIAGNOSIS — D509 Iron deficiency anemia, unspecified: Secondary | ICD-10-CM | POA: Diagnosis not present

## 2019-02-07 DIAGNOSIS — N2581 Secondary hyperparathyroidism of renal origin: Secondary | ICD-10-CM | POA: Diagnosis not present

## 2019-02-07 DIAGNOSIS — D631 Anemia in chronic kidney disease: Secondary | ICD-10-CM | POA: Diagnosis not present

## 2019-02-07 DIAGNOSIS — N186 End stage renal disease: Secondary | ICD-10-CM | POA: Diagnosis not present

## 2019-02-07 DIAGNOSIS — Z992 Dependence on renal dialysis: Secondary | ICD-10-CM | POA: Diagnosis not present

## 2019-02-09 DIAGNOSIS — Z992 Dependence on renal dialysis: Secondary | ICD-10-CM | POA: Diagnosis not present

## 2019-02-09 DIAGNOSIS — D509 Iron deficiency anemia, unspecified: Secondary | ICD-10-CM | POA: Diagnosis not present

## 2019-02-09 DIAGNOSIS — N2581 Secondary hyperparathyroidism of renal origin: Secondary | ICD-10-CM | POA: Diagnosis not present

## 2019-02-09 DIAGNOSIS — E559 Vitamin D deficiency, unspecified: Secondary | ICD-10-CM | POA: Diagnosis not present

## 2019-02-09 DIAGNOSIS — D631 Anemia in chronic kidney disease: Secondary | ICD-10-CM | POA: Diagnosis not present

## 2019-02-09 DIAGNOSIS — N186 End stage renal disease: Secondary | ICD-10-CM | POA: Diagnosis not present

## 2019-02-12 DIAGNOSIS — D631 Anemia in chronic kidney disease: Secondary | ICD-10-CM | POA: Diagnosis not present

## 2019-02-12 DIAGNOSIS — E559 Vitamin D deficiency, unspecified: Secondary | ICD-10-CM | POA: Diagnosis not present

## 2019-02-12 DIAGNOSIS — D509 Iron deficiency anemia, unspecified: Secondary | ICD-10-CM | POA: Diagnosis not present

## 2019-02-12 DIAGNOSIS — Z992 Dependence on renal dialysis: Secondary | ICD-10-CM | POA: Diagnosis not present

## 2019-02-12 DIAGNOSIS — N2581 Secondary hyperparathyroidism of renal origin: Secondary | ICD-10-CM | POA: Diagnosis not present

## 2019-02-12 DIAGNOSIS — N186 End stage renal disease: Secondary | ICD-10-CM | POA: Diagnosis not present

## 2019-02-14 DIAGNOSIS — N186 End stage renal disease: Secondary | ICD-10-CM | POA: Diagnosis not present

## 2019-02-14 DIAGNOSIS — Z992 Dependence on renal dialysis: Secondary | ICD-10-CM | POA: Diagnosis not present

## 2019-02-14 DIAGNOSIS — D509 Iron deficiency anemia, unspecified: Secondary | ICD-10-CM | POA: Diagnosis not present

## 2019-02-14 DIAGNOSIS — E559 Vitamin D deficiency, unspecified: Secondary | ICD-10-CM | POA: Diagnosis not present

## 2019-02-14 DIAGNOSIS — D631 Anemia in chronic kidney disease: Secondary | ICD-10-CM | POA: Diagnosis not present

## 2019-02-14 DIAGNOSIS — N2581 Secondary hyperparathyroidism of renal origin: Secondary | ICD-10-CM | POA: Diagnosis not present

## 2019-03-21 DIAGNOSIS — E559 Vitamin D deficiency, unspecified: Secondary | ICD-10-CM | POA: Diagnosis not present

## 2019-03-21 DIAGNOSIS — N186 End stage renal disease: Secondary | ICD-10-CM | POA: Diagnosis not present

## 2019-03-21 DIAGNOSIS — D509 Iron deficiency anemia, unspecified: Secondary | ICD-10-CM | POA: Diagnosis not present

## 2019-03-21 DIAGNOSIS — Z992 Dependence on renal dialysis: Secondary | ICD-10-CM | POA: Diagnosis not present

## 2019-03-21 DIAGNOSIS — D631 Anemia in chronic kidney disease: Secondary | ICD-10-CM | POA: Diagnosis not present

## 2019-03-23 DIAGNOSIS — N186 End stage renal disease: Secondary | ICD-10-CM | POA: Diagnosis not present

## 2019-03-23 DIAGNOSIS — D509 Iron deficiency anemia, unspecified: Secondary | ICD-10-CM | POA: Diagnosis not present

## 2019-03-23 DIAGNOSIS — E559 Vitamin D deficiency, unspecified: Secondary | ICD-10-CM | POA: Diagnosis not present

## 2019-03-23 DIAGNOSIS — D631 Anemia in chronic kidney disease: Secondary | ICD-10-CM | POA: Diagnosis not present

## 2019-03-23 DIAGNOSIS — Z992 Dependence on renal dialysis: Secondary | ICD-10-CM | POA: Diagnosis not present

## 2019-03-25 DIAGNOSIS — D509 Iron deficiency anemia, unspecified: Secondary | ICD-10-CM | POA: Diagnosis not present

## 2019-03-25 DIAGNOSIS — Z992 Dependence on renal dialysis: Secondary | ICD-10-CM | POA: Diagnosis not present

## 2019-03-25 DIAGNOSIS — N186 End stage renal disease: Secondary | ICD-10-CM | POA: Diagnosis not present

## 2019-03-25 DIAGNOSIS — E559 Vitamin D deficiency, unspecified: Secondary | ICD-10-CM | POA: Diagnosis not present

## 2019-03-25 DIAGNOSIS — D631 Anemia in chronic kidney disease: Secondary | ICD-10-CM | POA: Diagnosis not present

## 2019-03-28 DIAGNOSIS — D509 Iron deficiency anemia, unspecified: Secondary | ICD-10-CM | POA: Diagnosis not present

## 2019-03-28 DIAGNOSIS — Z992 Dependence on renal dialysis: Secondary | ICD-10-CM | POA: Diagnosis not present

## 2019-03-28 DIAGNOSIS — D631 Anemia in chronic kidney disease: Secondary | ICD-10-CM | POA: Diagnosis not present

## 2019-03-28 DIAGNOSIS — E119 Type 2 diabetes mellitus without complications: Secondary | ICD-10-CM | POA: Diagnosis not present

## 2019-03-28 DIAGNOSIS — E559 Vitamin D deficiency, unspecified: Secondary | ICD-10-CM | POA: Diagnosis not present

## 2019-03-28 DIAGNOSIS — N186 End stage renal disease: Secondary | ICD-10-CM | POA: Diagnosis not present

## 2019-03-30 DIAGNOSIS — E559 Vitamin D deficiency, unspecified: Secondary | ICD-10-CM | POA: Diagnosis not present

## 2019-03-30 DIAGNOSIS — N186 End stage renal disease: Secondary | ICD-10-CM | POA: Diagnosis not present

## 2019-03-30 DIAGNOSIS — D631 Anemia in chronic kidney disease: Secondary | ICD-10-CM | POA: Diagnosis not present

## 2019-03-30 DIAGNOSIS — Z992 Dependence on renal dialysis: Secondary | ICD-10-CM | POA: Diagnosis not present

## 2019-03-30 DIAGNOSIS — D509 Iron deficiency anemia, unspecified: Secondary | ICD-10-CM | POA: Diagnosis not present

## 2019-04-01 DIAGNOSIS — E559 Vitamin D deficiency, unspecified: Secondary | ICD-10-CM | POA: Diagnosis not present

## 2019-04-01 DIAGNOSIS — Z992 Dependence on renal dialysis: Secondary | ICD-10-CM | POA: Diagnosis not present

## 2019-04-01 DIAGNOSIS — D631 Anemia in chronic kidney disease: Secondary | ICD-10-CM | POA: Diagnosis not present

## 2019-04-01 DIAGNOSIS — D509 Iron deficiency anemia, unspecified: Secondary | ICD-10-CM | POA: Diagnosis not present

## 2019-04-01 DIAGNOSIS — N186 End stage renal disease: Secondary | ICD-10-CM | POA: Diagnosis not present

## 2019-04-04 DIAGNOSIS — N186 End stage renal disease: Secondary | ICD-10-CM | POA: Diagnosis not present

## 2019-04-04 DIAGNOSIS — D509 Iron deficiency anemia, unspecified: Secondary | ICD-10-CM | POA: Diagnosis not present

## 2019-04-04 DIAGNOSIS — D631 Anemia in chronic kidney disease: Secondary | ICD-10-CM | POA: Diagnosis not present

## 2019-04-04 DIAGNOSIS — E559 Vitamin D deficiency, unspecified: Secondary | ICD-10-CM | POA: Diagnosis not present

## 2019-04-04 DIAGNOSIS — Z992 Dependence on renal dialysis: Secondary | ICD-10-CM | POA: Diagnosis not present

## 2019-04-06 DIAGNOSIS — Z992 Dependence on renal dialysis: Secondary | ICD-10-CM | POA: Diagnosis not present

## 2019-04-06 DIAGNOSIS — N186 End stage renal disease: Secondary | ICD-10-CM | POA: Diagnosis not present

## 2019-04-06 DIAGNOSIS — E559 Vitamin D deficiency, unspecified: Secondary | ICD-10-CM | POA: Diagnosis not present

## 2019-04-06 DIAGNOSIS — D509 Iron deficiency anemia, unspecified: Secondary | ICD-10-CM | POA: Diagnosis not present

## 2019-04-06 DIAGNOSIS — D631 Anemia in chronic kidney disease: Secondary | ICD-10-CM | POA: Diagnosis not present

## 2019-04-08 DIAGNOSIS — E559 Vitamin D deficiency, unspecified: Secondary | ICD-10-CM | POA: Diagnosis not present

## 2019-04-08 DIAGNOSIS — N186 End stage renal disease: Secondary | ICD-10-CM | POA: Diagnosis not present

## 2019-04-08 DIAGNOSIS — D509 Iron deficiency anemia, unspecified: Secondary | ICD-10-CM | POA: Diagnosis not present

## 2019-04-08 DIAGNOSIS — D631 Anemia in chronic kidney disease: Secondary | ICD-10-CM | POA: Diagnosis not present

## 2019-04-08 DIAGNOSIS — Z992 Dependence on renal dialysis: Secondary | ICD-10-CM | POA: Diagnosis not present

## 2019-04-11 ENCOUNTER — Telehealth (INDEPENDENT_AMBULATORY_CARE_PROVIDER_SITE_OTHER): Payer: Self-pay

## 2019-04-11 DIAGNOSIS — N186 End stage renal disease: Secondary | ICD-10-CM | POA: Diagnosis not present

## 2019-04-11 DIAGNOSIS — Z992 Dependence on renal dialysis: Secondary | ICD-10-CM | POA: Diagnosis not present

## 2019-04-11 DIAGNOSIS — E559 Vitamin D deficiency, unspecified: Secondary | ICD-10-CM | POA: Diagnosis not present

## 2019-04-11 DIAGNOSIS — D509 Iron deficiency anemia, unspecified: Secondary | ICD-10-CM | POA: Diagnosis not present

## 2019-04-11 DIAGNOSIS — D631 Anemia in chronic kidney disease: Secondary | ICD-10-CM | POA: Diagnosis not present

## 2019-04-11 NOTE — Telephone Encounter (Signed)
I spoke with Nira Conn at [facility] and the patient is now scheduled for left arm shuntogram with Dr. Lucky Cowboy on 04/15/19 with a 11:00 am arrival time to the [facility]. Patient will do covid testing on 04/14/19 between 12:30-2:30 pm at the [facility]. Pre-procedure instructions will be faxed to [facility].

## 2019-04-13 DIAGNOSIS — N186 End stage renal disease: Secondary | ICD-10-CM | POA: Diagnosis not present

## 2019-04-13 DIAGNOSIS — Z992 Dependence on renal dialysis: Secondary | ICD-10-CM | POA: Diagnosis not present

## 2019-04-13 DIAGNOSIS — D631 Anemia in chronic kidney disease: Secondary | ICD-10-CM | POA: Diagnosis not present

## 2019-04-13 DIAGNOSIS — D509 Iron deficiency anemia, unspecified: Secondary | ICD-10-CM | POA: Diagnosis not present

## 2019-04-13 DIAGNOSIS — E559 Vitamin D deficiency, unspecified: Secondary | ICD-10-CM | POA: Diagnosis not present

## 2019-04-14 ENCOUNTER — Other Ambulatory Visit (INDEPENDENT_AMBULATORY_CARE_PROVIDER_SITE_OTHER): Payer: Self-pay | Admitting: Nurse Practitioner

## 2019-04-15 ENCOUNTER — Ambulatory Visit
Admission: RE | Admit: 2019-04-15 | Discharge: 2019-04-15 | Disposition: A | Discharge status: Home / Self Care | Payer: Medicare Other | Attending: Vascular Surgery | Admitting: Vascular Surgery

## 2019-04-15 ENCOUNTER — Encounter
Admission: RE | Disposition: A | Discharge status: Home / Self Care | Payer: Self-pay | Source: Home / Self Care | Attending: Vascular Surgery

## 2019-04-15 DIAGNOSIS — D509 Iron deficiency anemia, unspecified: Secondary | ICD-10-CM | POA: Diagnosis not present

## 2019-04-15 DIAGNOSIS — Z992 Dependence on renal dialysis: Secondary | ICD-10-CM | POA: Diagnosis not present

## 2019-04-15 DIAGNOSIS — D631 Anemia in chronic kidney disease: Secondary | ICD-10-CM | POA: Diagnosis not present

## 2019-04-15 DIAGNOSIS — N186 End stage renal disease: Secondary | ICD-10-CM | POA: Diagnosis not present

## 2019-04-15 DIAGNOSIS — E559 Vitamin D deficiency, unspecified: Secondary | ICD-10-CM | POA: Diagnosis not present

## 2019-04-15 SURGERY — A/V SHUNTOGRAM
Anesthesia: Moderate Sedation | Laterality: Left

## 2019-04-15 MED ORDER — CEFAZOLIN SODIUM-DEXTROSE 1-4 GM/50ML-% IV SOLN: 1.0000 g | Freq: Once | INTRAVENOUS | Status: DC

## 2019-04-17 DIAGNOSIS — Z992 Dependence on renal dialysis: Secondary | ICD-10-CM | POA: Diagnosis not present

## 2019-04-17 DIAGNOSIS — N186 End stage renal disease: Secondary | ICD-10-CM | POA: Diagnosis not present

## 2019-04-18 DIAGNOSIS — Z992 Dependence on renal dialysis: Secondary | ICD-10-CM | POA: Diagnosis not present

## 2019-04-18 DIAGNOSIS — D509 Iron deficiency anemia, unspecified: Secondary | ICD-10-CM | POA: Diagnosis not present

## 2019-04-18 DIAGNOSIS — E559 Vitamin D deficiency, unspecified: Secondary | ICD-10-CM | POA: Diagnosis not present

## 2019-04-18 DIAGNOSIS — D631 Anemia in chronic kidney disease: Secondary | ICD-10-CM | POA: Diagnosis not present

## 2019-04-18 DIAGNOSIS — N186 End stage renal disease: Secondary | ICD-10-CM | POA: Diagnosis not present

## 2019-04-19 ENCOUNTER — Encounter (INDEPENDENT_AMBULATORY_CARE_PROVIDER_SITE_OTHER): Payer: Self-pay

## 2019-04-20 DIAGNOSIS — Z992 Dependence on renal dialysis: Secondary | ICD-10-CM | POA: Diagnosis not present

## 2019-04-20 DIAGNOSIS — E559 Vitamin D deficiency, unspecified: Secondary | ICD-10-CM | POA: Diagnosis not present

## 2019-04-20 DIAGNOSIS — D509 Iron deficiency anemia, unspecified: Secondary | ICD-10-CM | POA: Diagnosis not present

## 2019-04-20 DIAGNOSIS — D631 Anemia in chronic kidney disease: Secondary | ICD-10-CM | POA: Diagnosis not present

## 2019-04-20 DIAGNOSIS — N186 End stage renal disease: Secondary | ICD-10-CM | POA: Diagnosis not present

## 2019-04-22 DIAGNOSIS — N186 End stage renal disease: Secondary | ICD-10-CM | POA: Diagnosis not present

## 2019-04-22 DIAGNOSIS — Z992 Dependence on renal dialysis: Secondary | ICD-10-CM | POA: Diagnosis not present

## 2019-04-22 DIAGNOSIS — D631 Anemia in chronic kidney disease: Secondary | ICD-10-CM | POA: Diagnosis not present

## 2019-04-22 DIAGNOSIS — Z1159 Encounter for screening for other viral diseases: Secondary | ICD-10-CM | POA: Diagnosis not present

## 2019-04-22 DIAGNOSIS — D509 Iron deficiency anemia, unspecified: Secondary | ICD-10-CM | POA: Diagnosis not present

## 2019-04-22 DIAGNOSIS — E559 Vitamin D deficiency, unspecified: Secondary | ICD-10-CM | POA: Diagnosis not present

## 2019-04-25 ENCOUNTER — Other Ambulatory Visit: Payer: Self-pay

## 2019-04-25 ENCOUNTER — Emergency Department (HOSPITAL_COMMUNITY)
Admission: EM | Admit: 2019-04-25 | Discharge: 2019-04-25 | Disposition: A | Discharge status: Home / Self Care | Payer: Medicare Other | Attending: Emergency Medicine | Admitting: Emergency Medicine

## 2019-04-25 ENCOUNTER — Encounter (HOSPITAL_COMMUNITY): Payer: Self-pay | Admitting: Emergency Medicine

## 2019-04-25 DIAGNOSIS — M3210 Systemic lupus erythematosus, organ or system involvement unspecified: Secondary | ICD-10-CM | POA: Diagnosis not present

## 2019-04-25 DIAGNOSIS — Z79899 Other long term (current) drug therapy: Secondary | ICD-10-CM | POA: Insufficient documentation

## 2019-04-25 DIAGNOSIS — F17210 Nicotine dependence, cigarettes, uncomplicated: Secondary | ICD-10-CM | POA: Insufficient documentation

## 2019-04-25 DIAGNOSIS — Z7982 Long term (current) use of aspirin: Secondary | ICD-10-CM | POA: Insufficient documentation

## 2019-04-25 DIAGNOSIS — Y69 Unspecified misadventure during surgical and medical care: Secondary | ICD-10-CM | POA: Diagnosis not present

## 2019-04-25 DIAGNOSIS — Z7901 Long term (current) use of anticoagulants: Secondary | ICD-10-CM | POA: Diagnosis not present

## 2019-04-25 DIAGNOSIS — Z992 Dependence on renal dialysis: Secondary | ICD-10-CM | POA: Diagnosis not present

## 2019-04-25 DIAGNOSIS — N186 End stage renal disease: Secondary | ICD-10-CM | POA: Diagnosis not present

## 2019-04-25 DIAGNOSIS — I10 Essential (primary) hypertension: Secondary | ICD-10-CM | POA: Diagnosis not present

## 2019-04-25 DIAGNOSIS — D631 Anemia in chronic kidney disease: Secondary | ICD-10-CM | POA: Diagnosis not present

## 2019-04-25 DIAGNOSIS — T82838A Hemorrhage of vascular prosthetic devices, implants and grafts, initial encounter: Secondary | ICD-10-CM | POA: Insufficient documentation

## 2019-04-25 DIAGNOSIS — R58 Hemorrhage, not elsewhere classified: Secondary | ICD-10-CM | POA: Diagnosis not present

## 2019-04-25 DIAGNOSIS — E559 Vitamin D deficiency, unspecified: Secondary | ICD-10-CM | POA: Diagnosis not present

## 2019-04-25 DIAGNOSIS — D509 Iron deficiency anemia, unspecified: Secondary | ICD-10-CM | POA: Diagnosis not present

## 2019-04-25 NOTE — ED Triage Notes (Signed)
[facility] - pt was at [facility] when an abscess on her arm where her shunt is started bleeding. Anabel Halon was unable to stop bleeding, so EMS was called.

## 2019-04-25 NOTE — Discharge Instructions (Addendum)
Elevate your left arm on pillows tonight.  Keep the bandage in place until you are seen on Wednesday for your next dialysis appointment.  If your left arm becomes numb or discolored you may loosen the outside bandages.  Return to the ER if you develop recurrent bleeding.

## 2019-04-25 NOTE — ED Provider Notes (Signed)
[facility] EMERGENCY DEPARTMENT Provider Note   CSN: [id number] Arrival date & time: 04/25/19  2006     History Chief Complaint  Patient presents with  . Dialysis Shunt Bleeding    [name] is a 44 y.o. female.  HPI      [name] is a 44 y.o. female who presents to the Emergency Department complaining of bleeding from her dialysis port post treatment.  She states that she began bleeding at her dialysis port of the left upper arm after her treatment was completed and the staff at the facility could not get the bleeding to stop.  She takes ASA  daily.  She denies pain, weakness, dizziness and syncope.     Past Medical History:  Diagnosis Date  . Anemia   . BV (bacterial vaginosis) 11/08/2013  . Depression   . Dialysis patient ([place])   . DVT (deep venous thrombosis) ([place])   . Hypertension   . Lupus ([place])   . Renal disorder   . Vaginal odor 11/08/2013    Patient Active Problem List   Diagnosis Date Noted  . Diabetes ([place]) 11/02/2018  . Lipoma of extremity   . Hyperkalemia 10/23/2016  . Complication of vascular access for dialysis 04/01/2016  . Epilepsy, unspecified, not intractable, with status epilepticus ([place]) 10/01/2015  . Generalized muscle weakness 10/01/2015  . Vaginal discharge 11/08/2013  . Vaginal odor 11/08/2013  . BV (bacterial vaginosis) 11/08/2013  . End stage renal disease ([place]) 12/27/2010  . Anemia in chronic kidney disease 12/27/2010  . Major depressive disorder with single episode 12/27/2010  . Secondary hyperparathyroidism of renal origin ([place]) 12/27/2010  . Systemic lupus erythematosus, organ or system involvement unspecified ([place]) 12/27/2010  . Type 2 diabetes mellitus without complication ([place]) 24/26/8341  . SWELLING, NECK 04/02/2007  . ASTHMA 03/15/2007  . FOOT PAIN, RIGHT 03/15/2007  . COUGH 03/15/2007  . MENTAL RETARDATION, MODERATE 01/19/2007  . STATUS, MENTAL, ALTERED 07/16/2006  . ANOREXIA 05/26/2006  . Disease of  pericardium 05/15/2006  . Tachycardia 05/15/2006  . Lupus ([place]) 04/01/2006  . ANEMIA-NOS 03/31/2006  . LEUKOCYTOSIS 03/31/2006  . DEPRESSION 03/31/2006  . MIGRAINE HEADACHE 03/31/2006  . PERIPHERAL NEUROPATHY 03/31/2006  . Essential (primary) hypertension 03/31/2006  . GERD 03/31/2006  . OSTEOARTHRITIS 03/31/2006  . HYPERGLYCEMIA 03/31/2006    Past Surgical History:  Procedure Laterality Date  . A/V SHUNT INTERVENTION N/A 10/24/2016   Procedure: A/V SHUNT INTERVENTION;  Surgeon: Katha Cabal, MD;  Location: [place] [facility];  Service: Cardiovascular;  Laterality: N/A;  . A/V SHUNTOGRAM Left 12/08/2017   Procedure: A/V SHUNTOGRAM;  Surgeon: Katha Cabal, MD;  Location: [place] [facility];  Service: Cardiovascular;  Laterality: Left;  . A/V SHUNTOGRAM Left 01/28/2018   Procedure: A/V SHUNTOGRAM;  Surgeon: Algernon Huxley, MD;  Location: [place] [facility];  Service: Cardiovascular;  Laterality: Left;  . A/V SHUNTOGRAM Left 04/01/2018   Procedure: A/V SHUNTOGRAM;  Surgeon: Algernon Huxley, MD;  Location: [place] [facility];  Service: Cardiovascular;  Laterality: Left;  . AV FISTULA PLACEMENT    . DIALYSIS/PERMA CATHETER INSERTION  10/23/2016   Procedure: DIALYSIS/PERMA CATHETER INSERTION;  Surgeon: Algernon Huxley, MD;  Location: [place] [facility];  Service: Cardiovascular;;  . IR REMOVAL TUN CV CATH W/O FL  09/22/2017  . IR THROMBECTOMY AV FISTULA W/THROMBOLYSIS INC/SHUNT/IMG LEFT Left 09/04/2017  . IR US GUIDE VASC ACCESS LEFT  09/04/2017  . MASS EXCISION Left 06/01/2018   Procedure: EXCISION 5 X  3CM LIPOMA LEFT THIGH;  Surgeon: Aviva Signs, MD;  Location[facility];  Service: General;  Laterality: Left;  . PERIPHERAL VASCULAR CATHETERIZATION Left 01/04/2015   Procedure: A/V Shuntogram/Fistulagram;  Surgeon: Algernon Huxley, MD;  Location: [place] [facility];  Service: Cardiovascular;  Laterality: Left;  . PERIPHERAL VASCULAR CATHETERIZATION N/A 01/04/2015   Procedure: A/V Shunt  Intervention;  Surgeon: Algernon Huxley, MD;  Location: [place] [facility];  Service: Cardiovascular;  Laterality: N/A;  . PERIPHERAL VASCULAR CATHETERIZATION Left 04/13/2015   Procedure: A/V Shuntogram/Fistulagram;  Surgeon: Katha Cabal, MD;  Location: [place] [facility];  Service: Cardiovascular;  Laterality: Left;  . PERIPHERAL VASCULAR CATHETERIZATION Left 04/13/2015   Procedure: A/V Shunt Intervention;  Surgeon: Katha Cabal, MD;  Location: [place] [facility];  Service: Cardiovascular;  Laterality: Left;     OB History    Gravida  3   Para  3   Term  3   Preterm      AB      Living  2     SAB      TAB      Ectopic      Multiple      Live Births  2           Family History  Problem Relation Age of Onset  . Seizures Son   . Kidney disease Mother        on dialysis  . Lupus Mother     Social History   Tobacco Use  . Smoking status: Current Every Day Smoker    Packs/day: 0.50    Years: 15.00    Pack years: 7.50    Types: Cigarettes  . Smokeless tobacco: Never Used  Substance Use Topics  . Alcohol use: No  . Drug use: No    Home Medications Prior to Admission medications   Medication Sig Start Date End Date Taking? Authorizing Provider  albuterol (PROVENTIL) (2.5 MG/3ML) 0.083% nebulizer solution Take 2.5 mg by nebulization 4 (four) times daily as needed for wheezing or shortness of breath.     [provider]  ALPRAZolam Duanne Moron) 0.25 MG tablet Take 0.25 mg by mouth 2 (two) times daily.     [provider]  aspirin EC 81 MG tablet Take 81 mg by mouth daily.    [provider]  chlorhexidine (PERIDEX) 0.12 % solution Use as directed 5 mLs in the mouth or throat 2 (two) times daily.     [provider]  clopidogrel (PLAVIX) 75 MG tablet Take 75 mg by mouth daily.    [provider]  docusate sodium (COLACE) 100 MG capsule Take 100 mg by mouth daily.    [provider]  epoetin alfa  (EPOGEN,PROCRIT) 4000 UNIT/ML injection Inject 4,000 Units into the vein See admin instructions. Mon, wed, and Friday at dialysis     [provider]  guaiFENesin (ROBITUSSIN) 100 MG/5ML liquid Take 200 mg by mouth every 6 (six) hours as needed for cough.     [provider]  HYDROcodone-acetaminophen (NORCO) 5-325 MG tablet Take 1 tablet by mouth every 6 (six) hours as needed for moderate pain. 06/01/18   Aviva Signs, MD  hydroxychloroquine (PLAQUENIL) 200 MG tablet Take 200 mg by mouth daily.    [provider]  ipratropium (ATROVENT) 0.02 % nebulizer solution Take 250 mcg by nebulization 4 (four) times daily as needed for wheezing or shortness of breath.     [provider]  lidocaine-prilocaine (EMLA) cream Apply 1 application topically as needed (port access).  12/20/12   [provider]  loratadine (CLARITIN) 10 MG tablet Take 10 mg by mouth daily as needed for allergies.    [provider]  midodrine (PROAMATINE) 10 MG tablet Take 10 mg by mouth 2 (two) times daily.     [provider]  mirtazapine (REMERON) 15 MG tablet Take 7.5 mg by mouth at bedtime.  11/03/13   [provider]  multivitamin (RENA-VIT) TABS tablet Take 1 tablet by mouth daily.    [provider]  pantoprazole (PROTONIX) 40 MG tablet Take 40 mg by mouth daily.    [provider]  phenytoin (DILANTIN) 100 MG ER capsule Take 100 mg by mouth 2 (two) times daily.     [provider]  sertraline (ZOLOFT) 100 MG tablet Take 200 mg by mouth daily.     [provider]  sevelamer carbonate (RENVELA) 800 MG tablet Take 800 mg by mouth See admin instructions. Take 800 mg by mouth three times daily with meals and 800 mg by mouth twice daily with snacks    [provider]  simvastatin (ZOCOR) 40 MG tablet Take 40 mg by mouth at bedtime.     [provider]    Allergies    Dust mite extract  Review of Systems    Review of Systems  Constitutional: Negative for chills, fatigue and fever.  HENT: Negative for sore throat and trouble swallowing.   Respiratory: Negative for cough, shortness of breath and wheezing.   Cardiovascular: Negative for chest pain and palpitations.  Gastrointestinal: Negative for abdominal pain, blood in stool, nausea and vomiting.  Genitourinary: Negative for dysuria, flank pain and hematuria.  Musculoskeletal: Negative for arthralgias, back pain, myalgias, neck pain and neck stiffness.  Skin: Negative for rash.       Bleeding at her dialysis shunt of left upper arm   Neurological: Negative for dizziness, weakness and numbness.  Hematological: Does not bruise/bleed easily.    Physical Exam Updated Vital Signs BP 99/74   Pulse 84   Temp 98.6 F (37 C)   Resp 16   Ht 5\' 5"  (1.651 m)   Wt 45.8 kg   SpO2 100%   BMI 16.80 kg/m   Physical Exam Vitals and nursing note reviewed.  Constitutional:      Appearance: Normal appearance. She is not toxic-appearing.  HENT:     Head: Atraumatic.  Neck:     Thyroid: No thyromegaly.     Meningeal: Kernig's sign absent.  Cardiovascular:     Rate and Rhythm: Normal rate and regular rhythm.     Pulses: Normal pulses.  Pulmonary:     Effort: Pulmonary effort is normal.     Breath sounds: Normal breath sounds. No wheezing.  Musculoskeletal:        General: No swelling or tenderness. Normal range of motion.     Cervical back: Normal range of motion and neck supple. No tenderness.  Skin:    General: Skin is warm.     Capillary Refill: Capillary refill takes less than 2 seconds.     Findings: No rash.     Comments: Mild bleeding of the left upper arm over the site of a dialysis shunt.  Palpable thrill noted.  No edema.    Neurological:     General: No focal deficit present.     Mental Status: She is alert.     Sensory: No sensory deficit.  Motor: No weakness.     ED Results / Procedures / Treatments   Labs (all labs  ordered are listed, but only abnormal results are displayed) Labs Reviewed - No data to display  EKG None  Radiology No results found.  Procedures Procedures (including critical care time)  Medications Ordered in ED Medications - No data to display  ED Course  I have reviewed the triage vital signs and the nursing notes.  Pertinent labs & imaging results that were available during my care of the patient were reviewed by me and considered in my medical decision making (see chart for details).    MDM Rules/Calculators/A&P                      Patient with mild bleeding at the dialysis shunt on arrival. Bleeding began after completion of dialysis treatment.   Pt is well appearing, watching TV during exam.  Vitals stable.    Bleeding controlled with application of quick clot dressing.  Pt has been observed in the dept and on recheck, dressing remains dry.  Advised pt to leave dressing in place until next dialysis treatment.  Return precautions also given.      Final Clinical Impression(s) / ED Diagnoses Final diagnoses:  Bleeding from dialysis shunt, initial encounter [facility])    Rx / DC Orders ED Discharge Orders    None       Kem Parkinson, PA-C 04/28/19 1622    Milton Ferguson, MD 04/29/19 [phone]

## 2019-04-25 NOTE — ED Notes (Signed)
Bandage intact, no leaking or bleeding through bandage noted at this time. Pt sitting comfortably waiting for PA/physician.

## 2019-04-26 ENCOUNTER — Ambulatory Visit: Payer: Medicare Other | Attending: Internal Medicine

## 2019-04-26 ENCOUNTER — Other Ambulatory Visit: Payer: Medicare Other | Attending: Vascular Surgery

## 2019-04-26 DIAGNOSIS — Z20822 Contact with and (suspected) exposure to covid-19: Secondary | ICD-10-CM | POA: Diagnosis not present

## 2019-04-27 ENCOUNTER — Other Ambulatory Visit (INDEPENDENT_AMBULATORY_CARE_PROVIDER_SITE_OTHER): Payer: Self-pay | Admitting: Nurse Practitioner

## 2019-04-27 DIAGNOSIS — D631 Anemia in chronic kidney disease: Secondary | ICD-10-CM | POA: Diagnosis not present

## 2019-04-27 DIAGNOSIS — Z992 Dependence on renal dialysis: Secondary | ICD-10-CM | POA: Diagnosis not present

## 2019-04-27 DIAGNOSIS — E559 Vitamin D deficiency, unspecified: Secondary | ICD-10-CM | POA: Diagnosis not present

## 2019-04-27 DIAGNOSIS — N186 End stage renal disease: Secondary | ICD-10-CM | POA: Diagnosis not present

## 2019-04-27 DIAGNOSIS — D509 Iron deficiency anemia, unspecified: Secondary | ICD-10-CM | POA: Diagnosis not present

## 2019-04-27 LAB — NOVEL CORONAVIRUS, NAA: SARS-CoV-2, NAA: NOT DETECTED

## 2019-04-28 ENCOUNTER — Ambulatory Visit
Admission: RE | Admit: 2019-04-28 | Discharge: 2019-04-28 | Payer: Medicare Other | Attending: Vascular Surgery | Admitting: Vascular Surgery

## 2019-04-28 ENCOUNTER — Encounter
Admission: RE | Disposition: A | Discharge status: Other Acute Inpatient Hospital | Payer: Self-pay | Source: Home / Self Care | Attending: Vascular Surgery

## 2019-04-28 ENCOUNTER — Encounter: Payer: Self-pay | Admitting: Vascular Surgery

## 2019-04-28 ENCOUNTER — Other Ambulatory Visit: Payer: Self-pay

## 2019-04-28 DIAGNOSIS — N186 End stage renal disease: Secondary | ICD-10-CM | POA: Diagnosis not present

## 2019-04-28 DIAGNOSIS — M3214 Glomerular disease in systemic lupus erythematosus: Secondary | ICD-10-CM | POA: Diagnosis not present

## 2019-04-28 DIAGNOSIS — I959 Hypotension, unspecified: Secondary | ICD-10-CM | POA: Diagnosis not present

## 2019-04-28 DIAGNOSIS — F17210 Nicotine dependence, cigarettes, uncomplicated: Secondary | ICD-10-CM | POA: Insufficient documentation

## 2019-04-28 DIAGNOSIS — E1122 Type 2 diabetes mellitus with diabetic chronic kidney disease: Secondary | ICD-10-CM | POA: Insufficient documentation

## 2019-04-28 DIAGNOSIS — Z992 Dependence on renal dialysis: Secondary | ICD-10-CM | POA: Diagnosis not present

## 2019-04-28 DIAGNOSIS — F329 Major depressive disorder, single episode, unspecified: Secondary | ICD-10-CM | POA: Insufficient documentation

## 2019-04-28 DIAGNOSIS — T82858A Stenosis of vascular prosthetic devices, implants and grafts, initial encounter: Secondary | ICD-10-CM | POA: Diagnosis not present

## 2019-04-28 DIAGNOSIS — K219 Gastro-esophageal reflux disease without esophagitis: Secondary | ICD-10-CM | POA: Diagnosis not present

## 2019-04-28 DIAGNOSIS — T82898A Other specified complication of vascular prosthetic devices, implants and grafts, initial encounter: Secondary | ICD-10-CM | POA: Diagnosis not present

## 2019-04-28 DIAGNOSIS — I120 Hypertensive chronic kidney disease with stage 5 chronic kidney disease or end stage renal disease: Secondary | ICD-10-CM | POA: Diagnosis not present

## 2019-04-28 DIAGNOSIS — Z86718 Personal history of other venous thrombosis and embolism: Secondary | ICD-10-CM | POA: Insufficient documentation

## 2019-04-28 DIAGNOSIS — Y841 Kidney dialysis as the cause of abnormal reaction of the patient, or of later complication, without mention of misadventure at the time of the procedure: Secondary | ICD-10-CM | POA: Insufficient documentation

## 2019-04-28 DIAGNOSIS — F341 Dysthymic disorder: Secondary | ICD-10-CM | POA: Diagnosis not present

## 2019-04-28 HISTORY — PX: A/V SHUNTOGRAM: CATH118297

## 2019-04-28 LAB — POTASSIUM: Potassium: 3.8 mmol/L (ref 3.5–5.1)

## 2019-04-28 LAB — HCG, QUANTITATIVE, PREGNANCY: hCG, Beta Chain, Quant, S: 2 m[IU]/mL (ref <5)

## 2019-04-28 SURGERY — A/V SHUNTOGRAM
Anesthesia: Moderate Sedation | Laterality: Left

## 2019-04-28 MED ORDER — HEPARIN SODIUM (PORCINE) 1000 UNIT/ML IJ SOLN
INTRAMUSCULAR | Status: AC
  Filled 2019-04-28: qty 1

## 2019-04-28 MED ORDER — HEPARIN SODIUM (PORCINE) 1000 UNIT/ML IJ SOLN
INTRAMUSCULAR | Status: DC | PRN
  Administered 2019-04-28: 3000 [IU] via INTRAVENOUS

## 2019-04-28 MED ORDER — HYDROMORPHONE HCL 1 MG/ML IJ SOLN: 1.0000 mg | Freq: Once | INTRAMUSCULAR | Status: DC | PRN

## 2019-04-28 MED ORDER — FENTANYL CITRATE (PF) 100 MCG/2ML IJ SOLN
INTRAMUSCULAR | Status: DC | PRN
  Administered 2019-04-28: 50 ug via INTRAVENOUS

## 2019-04-28 MED ORDER — FENTANYL CITRATE (PF) 100 MCG/2ML IJ SOLN
INTRAMUSCULAR | Status: AC
  Filled 2019-04-28: qty 2

## 2019-04-28 MED ORDER — METHYLPREDNISOLONE SODIUM SUCC 125 MG IJ SOLR: 125.0000 mg | Freq: Once | INTRAMUSCULAR | Status: DC | PRN

## 2019-04-28 MED ORDER — MIDAZOLAM HCL 5 MG/5ML IJ SOLN
INTRAMUSCULAR | Status: AC
  Filled 2019-04-28: qty 5

## 2019-04-28 MED ORDER — FAMOTIDINE 20 MG PO TABS: 40.0000 mg | ORAL_TABLET | Freq: Once | ORAL | Status: DC | PRN

## 2019-04-28 MED ORDER — MIDAZOLAM HCL 2 MG/2ML IJ SOLN
INTRAMUSCULAR | Status: DC | PRN
  Administered 2019-04-28: 2 mg via INTRAVENOUS

## 2019-04-28 MED ORDER — DIPHENHYDRAMINE HCL 50 MG/ML IJ SOLN: 50.0000 mg | Freq: Once | INTRAMUSCULAR | Status: DC | PRN

## 2019-04-28 MED ORDER — SODIUM CHLORIDE 0.9 % IV SOLN: INTRAVENOUS | Status: DC

## 2019-04-28 MED ORDER — CEFAZOLIN SODIUM-DEXTROSE 1-4 GM/50ML-% IV SOLN
INTRAVENOUS | Status: AC
  Filled 2019-04-28: qty 50

## 2019-04-28 MED ORDER — CEFAZOLIN SODIUM-DEXTROSE 1-4 GM/50ML-% IV SOLN: 1.0000 g | Freq: Once | INTRAVENOUS | Status: DC

## 2019-04-28 MED ORDER — MIDAZOLAM HCL 2 MG/ML PO SYRP: 8.0000 mg | ORAL_SOLUTION | Freq: Once | ORAL | Status: DC | PRN

## 2019-04-28 MED ORDER — ONDANSETRON HCL 4 MG/2ML IJ SOLN: 4.0000 mg | Freq: Four times a day (QID) | INTRAMUSCULAR | Status: DC | PRN

## 2019-04-28 MED ORDER — IODIXANOL 320 MG/ML IV SOLN
INTRAVENOUS | Status: DC | PRN
  Administered 2019-04-28: 30 mL

## 2019-04-28 SURGICAL SUPPLY — 10 items
BALLN LUTONIX DCB 7X60X130 (BALLOONS) ×2
BALLOON LUTONIX DCB 7X60X130 (BALLOONS) IMPLANT
CANNULA 5F STIFF (CANNULA) ×1 IMPLANT
COVER PROBE U/S 5X48 (MISCELLANEOUS) ×1 IMPLANT
DEVICE PRESTO INFLATION (MISCELLANEOUS) ×1 IMPLANT
DRAPE BRACHIAL (DRAPES) ×1 IMPLANT
PACK ANGIOGRAPHY (CUSTOM PROCEDURE TRAY) ×2 IMPLANT
SHEATH BRITE TIP 6FRX5.5 (SHEATH) ×1 IMPLANT
SUT MNCRL AB 4-0 PS2 18 (SUTURE) ×1 IMPLANT
WIRE MAGIC TOR.035 180C (WIRE) ×1 IMPLANT

## 2019-04-28 NOTE — H&P (Signed)
[facility] Admission History & Physical  MRN : [id number]  [name] is a 44 y.o. (1976/01/10) female who presents with chief complaint of non-functioning access.  History of Present Illness:  I am asked to evaluate the patient by the dialysis center. The patient was sent here because they were unable to cannulate her dialysis access yesterday morning. Furthermore the Center states there is no thrill or bruit. The patient states this is the first dialysis run to be missed. This problem is acute in onset and has been present for approximately 2 days. The patient is unaware of any other change.  Patient denies pain or tenderness overlying the access.  There is no pain with dialysis.  The patient denies hand pain or finger pain consistent with steal syndrome.   Patient presents for a planned shuntogram.  Current Facility-Administered Medications  Medication Dose Route Frequency Provider Last Rate Last Admin  . 0.9 %  sodium chloride infusion   Intravenous Continuous Kris Hartmann, NP 10 mL/hr at 04/28/19 0851 New Bag at 04/28/19 0851  . ceFAZolin (ANCEF) IVPB 1 g/50 mL premix  1 g Intravenous Once Kris Hartmann, NP      . diphenhydrAMINE (BENADRYL) injection 50 mg  50 mg Intravenous Once PRN Kris Hartmann, NP      . famotidine (PEPCID) tablet 40 mg  40 mg Oral Once PRN Kris Hartmann, NP      . HYDROmorphone (DILAUDID) injection 1 mg  1 mg Intravenous Once PRN Eulogio Ditch E, NP      . methylPREDNISolone sodium succinate (SOLU-MEDROL) 125 mg/2 mL injection 125 mg  125 mg Intravenous Once PRN Eulogio Ditch E, NP      . midazolam (VERSED) 2 MG/ML syrup 8 mg  8 mg Oral Once PRN Kris Hartmann, NP      . ondansetron (ZOFRAN) injection 4 mg  4 mg Intravenous Q6H PRN Kris Hartmann, NP       Past Medical History:  Diagnosis Date  . Anemia   . BV (bacterial vaginosis) 11/08/2013  . Depression   . Dialysis patient ([place])   . DVT (deep venous thrombosis)  ([place])   . Hypertension   . Lupus ([place])   . Renal disorder   . Vaginal odor 11/08/2013   Past Surgical History:  Procedure Laterality Date  . A/V SHUNT INTERVENTION N/A 10/24/2016   Procedure: A/V SHUNT INTERVENTION;  Surgeon: Katha Cabal, MD;  Location: [facility];  Service: Cardiovascular;  Laterality: N/A;  . A/V SHUNTOGRAM Left 12/08/2017   Procedure: A/V SHUNTOGRAM;  Surgeon: Katha Cabal, MD;  Location: [facility];  Service: Cardiovascular;  Laterality: Left;  . A/V SHUNTOGRAM Left 01/28/2018   Procedure: A/V SHUNTOGRAM;  Surgeon: Algernon Huxley, MD;  Location: [facility];  Service: Cardiovascular;  Laterality: Left;  . A/V SHUNTOGRAM Left 04/01/2018   Procedure: A/V SHUNTOGRAM;  Surgeon: Algernon Huxley, MD;  Location: [facility];  Service: Cardiovascular;  Laterality: Left;  . AV FISTULA PLACEMENT    . DIALYSIS/PERMA CATHETER INSERTION  10/23/2016   Procedure: DIALYSIS/PERMA CATHETER INSERTION;  Surgeon: Algernon Huxley, MD;  Location: [facility];  Service: Cardiovascular;;  . IR REMOVAL TUN CV CATH W/O FL  09/22/2017  . IR THROMBECTOMY AV FISTULA W/THROMBOLYSIS INC/SHUNT/IMG LEFT Left 09/04/2017  . IR US GUIDE VASC ACCESS LEFT  09/04/2017  . MASS EXCISION Left 06/01/2018   Procedure: EXCISION 5  X 3CM LIPOMA LEFT THIGH;  Surgeon: Aviva Signs, MD;  Location: [facility];  Service: General;  Laterality: Left;  . PERIPHERAL VASCULAR CATHETERIZATION Left 01/04/2015   Procedure: A/V Shuntogram/Fistulagram;  Surgeon: Algernon Huxley, MD;  Location: [facility];  Service: Cardiovascular;  Laterality: Left;  . PERIPHERAL VASCULAR CATHETERIZATION N/A 01/04/2015   Procedure: A/V Shunt Intervention;  Surgeon: Algernon Huxley, MD;  Location: [facility];  Service: Cardiovascular;  Laterality: N/A;  . PERIPHERAL VASCULAR CATHETERIZATION Left 04/13/2015   Procedure: A/V Shuntogram/Fistulagram;  Surgeon: Katha Cabal, MD;  Location: [facility];  Service: Cardiovascular;  Laterality: Left;  . PERIPHERAL VASCULAR CATHETERIZATION Left 04/13/2015   Procedure: A/V Shunt Intervention;  Surgeon: Katha Cabal, MD;  Location: [facility];  Service: Cardiovascular;  Laterality: Left;   Social History Social History   Tobacco Use  . Smoking status: Current Every Day Smoker    Packs/day: 0.25    Years: 15.00    Pack years: 3.75    Types: Cigarettes  . Smokeless tobacco: Never Used  Substance Use Topics  . Alcohol use: No  . Drug use: No   Family History Family History  Problem Relation Age of Onset  . Seizures Son   . Kidney disease Mother        on dialysis  . Lupus Mother   No family history of bleeding or clotting disorders, autoimmune disease or porphyria  Allergies  Allergen Reactions  . Dust Mite Extract Other (See Comments)    sneezing   REVIEW OF SYSTEMS (Negative unless checked)  Constitutional: [] Weight loss  [] Fever  [] Chills Cardiac: [] Chest pain   [] Chest pressure   [] Palpitations   [] Shortness of breath when laying flat   [] Shortness of breath at rest   [x] Shortness of breath with exertion. Vascular:  [] Pain in legs with walking   [] Pain in legs at rest   [] Pain in legs when laying flat   [] Claudication   [] Pain in feet when walking  [] Pain in feet at rest  [] Pain in feet when laying flat   [] History of DVT   [] Phlebitis   [x] Swelling in legs   [] Varicose veins   [] Non-healing ulcers Pulmonary:   [] Uses home oxygen   [] Productive cough   [] Hemoptysis   [] Wheeze  [] COPD   [] Asthma Neurologic:  [] Dizziness  [] Blackouts   [] Seizures   [] History of stroke   [] History of TIA  [] Aphasia   [] Temporary blindness   [] Dysphagia   [] Weakness or numbness in arms   [] Weakness or numbness in legs Musculoskeletal:  [] Arthritis   [] Joint swelling   [] Joint pain   [] Low back pain Hematologic:  [] Easy bruising  [] Easy bleeding   [] Hypercoagulable state   [] Anemic  [] Hepatitis Gastrointestinal:  [] Blood  in stool   [] Vomiting blood  [] Gastroesophageal reflux/heartburn   [] Difficulty swallowing. Genitourinary:  [x] Chronic kidney disease   [] Difficult urination  [] Frequent urination  [] Burning with urination   [] Blood in urine Skin:  [] Rashes   [] Ulcers   [] Wounds Psychological:  [] History of anxiety   []  History of major depression.  Physical Examination  Vitals:   04/28/19 0851  BP: 105/71  Pulse: 61  Resp: 19  Temp: 98.1 F (36.7 C)  TempSrc: Oral  SpO2: 100%  Weight: 45.8 kg  Height: 5\' 5"  (1.651 m)   Body mass index is 16.8 kg/m. Gen: WD/WN, NAD Head: [place]/AT, No temporalis wasting. Prominent temp pulse not noted. Ear/Nose/Throat: Hearing grossly intact,  nares w/o erythema or drainage, oropharynx w/o Erythema/Exudate,  Eyes: Conjunctiva clear, sclera non-icteric Neck: Trachea midline.  No JVD.  Pulmonary:  Good air movement, respirations not labored, no use of accessory muscles.  Cardiac: RRR, normal S1, S2. Vascular:  Vessel Right Left  Radial Palpable Palpable  Ulnar Not Palpable Not Palpable  Brachial Palpable Palpable  Carotid Palpable, without bruit Palpable, without bruit   Left Upper Extremity: soft, warm distally to fingers, no bruit or thrill  Gastrointestinal: soft, non-tender/non-distended. No guarding/reflex.  Musculoskeletal: M/S 5/5 throughout.  Extremities without ischemic changes.  No deformity or atrophy.  Neurologic: Sensation grossly intact in extremities.  Symmetrical.  Speech is fluent. Motor exam as listed above. Psychiatric: Judgment intact, Mood & affect appropriate for pt's clinical situation. Dermatologic: No rashes or ulcers noted.  No cellulitis or open wounds. Lymph : No Cervical, Axillary, or Inguinal lymphadenopathy.  CBC Lab Results  Component Value Date   WBC 9.3 09/04/2017   HGB 12.3 06/01/2018   HCT 36.3 06/01/2018   MCV 89.4 06/01/2018   PLT 180 06/01/2018   BMET    Component Value Date/Time   NA 138 06/01/2018 0822   NA  137 02/18/2013 0440   K 5.1 06/01/2018 0822   K 4.9 07/06/2014 1308   CL 97 (L) 06/01/2018 0822   CL 100 02/18/2013 0440   CO2 26 06/01/2018 0822   CO2 33 (H) 02/18/2013 0440   GLUCOSE 70 06/01/2018 0822   GLUCOSE 73 02/18/2013 0440   BUN 23 (H) 06/01/2018 0822   BUN 9 02/18/2013 0440   CREATININE 6.45 (H) 06/01/2018 0822   CREATININE 1.93 (H) 02/18/2013 0440   CALCIUM 8.6 (L) 06/01/2018 0822   CALCIUM 8.2 (L) 02/18/2013 0440   CALCIUM (LL) 04/08/2007 1300    6.1 Result repeated and verified. CRITICAL VALUE NOTED.  VALUE IS CONSISTENT WITH PREVIOUSLY REPORTED AND CALLED VALUE. CRITICAL RESULT CALLED TO, READ BACK BY AND VERIFIED WITH: MENIGOSTO,A RN [id number] [place] S.   GFRNONAA 7 (L) 06/01/2018 3825   GFRNONAA 32 (L) 02/18/2013 0440   GFRAA 8 (L) 06/01/2018 0822   GFRAA 37 (L) 02/18/2013 0440   CrCl cannot be calculated (Patient's most recent lab result is older than the maximum 21 days allowed.).  COAG Lab Results  Component Value Date   INR 1.14 09/04/2017   INR 1.1 10/06/2008   INR 1.6 (H) 02/11/2008   Radiology No results found.  Assessment/Plan [name] is a 44 year old female with a known past medical history of end-stage renal disease who presents for left upper extremity shuntogram due to nonfunctioning dialysis access  1.  Complication dialysis device with thrombosis AV access:  Patient's left dialysis access is thrombosed. The patient will undergo thrombectomy using interventional techniques.  The risks and benefits were described to the patient.  All questions were answered.  The patient agrees to proceed with angiography and intervention. Potassium will be drawn to ensure that it is an appropriate level prior to performing thrombectomy. 2.  End-stage renal disease requiring hemodialysis:  Patient will continue dialysis therapy without further interruption if a successful thrombectomy is not achieved then catheter will be placed. Dialysis has already been  arranged since the patient missed their previous session 3.  Hypertension:  Patient will continue medical management; nephrology is following no changes in oral medications. 4. Diabetes mellitus:  Glucose will be monitored and oral medications been held this morning once the patient has undergone the patient's procedure po intake will be reinitiated and  again Accu-Cheks will be used to assess the blood glucose level and treat as needed. The patient will be restarted on the patient's usual hypoglycemic regime  Discussed with Dr. Mayme Genta, PA-C  04/28/2019 9:10 AM

## 2019-04-28 NOTE — Progress Notes (Signed)
Discharge instructions provided to [facility], owner of group home upon pick up of patient. Operative note sent to Theressa Stamps and faxed to guardian: Karoline Caldwell with [employer] per her request.

## 2019-04-28 NOTE — Discharge Instructions (Signed)
Moderate Conscious Sedation, Adult, Care After These instructions provide you with information about caring for yourself after your procedure. Your health care provider may also give you more specific instructions. Your treatment has been planned according to current medical practices, but problems sometimes occur. Call your health care provider if you have any problems or questions after your procedure. What can I expect after the procedure? After your procedure, it is common:  To feel sleepy for several hours.  To feel clumsy and have poor balance for several hours.  To have poor judgment for several hours.  To vomit if you eat too soon. Follow these instructions at home: For at least 24 hours after the procedure:   Do not: ? Participate in activities where you could fall or become injured. ? Drive. ? Use heavy machinery. ? Drink alcohol. ? Take sleeping pills or medicines that cause drowsiness. ? Make important decisions or sign legal documents. ? Take care of children on your own.  Rest. Eating and drinking  Follow the diet recommended by your health care provider.  If you vomit: ? Drink water, juice, or soup when you can drink without vomiting. ? Make sure you have little or no nausea before eating solid foods. General instructions  Have a responsible adult stay with you until you are awake and alert.  Take over-the-counter and prescription medicines only as told by your health care provider.  If you smoke, do not smoke without supervision.  Keep all follow-up visits as told by your health care provider. This is important. Contact a health care provider if:  You keep feeling nauseous or you keep vomiting.  You feel light-headed.  You develop a rash.  You have a fever. Get help right away if:  You have trouble breathing. This information is not intended to replace advice given to you by your health care provider. Make sure you discuss any questions you have  with your health care provider. Document Revised: 02/13/2017 Document Reviewed: 06/23/2015 Elsevier Patient Education  2020 Elsevier Inc. Dialysis Fistulogram, Care After This sheet gives you information about how to care for yourself after your procedure. Your health care provider may also give you more specific instructions. If you have problems or questions, contact your health care provider. What can I expect after the procedure? After the procedure, it is common to have:  A small amount of discomfort in the area where the small, thin tube (catheter) was placed for the procedure.  A small amount of bruising around the fistula.  Sleepiness and tiredness (fatigue). Follow these instructions at home: Activity   Rest at home and do not lift anything that is heavier than 5 lb (2.3 kg) on the day after your procedure.  Return to your normal activities as told by your health care provider. Ask your health care provider what activities are safe for you.  Do not drive or use heavy machinery while taking prescription pain medicine.  Do not drive for 24 hours if you were given a medicine to help you relax (sedative) during your procedure. Medicines   Take over-the-counter and prescription medicines only as told by your health care provider. Puncture site care  Follow instructions from your health care provider about how to take care of the site where catheters were inserted. Make sure you: ? Wash your hands with soap and water before you change your bandage (dressing). If soap and water are not available, use hand sanitizer. ? Change your dressing as told by your health   care provider. ? Leave stitches (sutures), skin glue, or adhesive strips in place. These skin closures may need to stay in place for 2 weeks or longer. If adhesive strip edges start to loosen and curl up, you may trim the loose edges. Do not remove adhesive strips completely unless your health care provider tells you to do  that.  Check your puncture area every day for signs of infection. Check for: ? Redness, swelling, or pain. ? Fluid or blood. ? Warmth. ? Pus or a bad smell. General instructions  Do not take baths, swim, or use a hot tub until your health care provider approves. Ask your health care provider if you may take showers. You may only be allowed to take sponge baths.  Monitor your dialysis fistula closely. Check to make sure that you can feel a vibration or buzz (a thrill) when you put your fingers over the fistula.  Prevent damage to your graft or fistula: ? Do not wear tight-fitting clothing or jewelry on the arm or leg that has your graft or fistula. ? Tell all your health care providers that you have a dialysis fistula or graft. ? Do not allow blood draws, IVs, or blood pressure readings to be done in the arm that has your fistula or graft. ? Do not allow flu shots or vaccinations in the arm with your fistula or graft.  Keep all follow-up visits as told by your health care provider. This is important. Contact a health care provider if:  You have redness, swelling, or pain at the site where the catheter was put in.  You have fluid or blood coming from the catheter site.  The catheter site feels warm to the touch.  You have pus or a bad smell coming from the catheter site.  You have a fever or chills. Get help right away if:  You feel weak.  You have trouble balancing.  You have trouble moving your arms or legs.  You have problems with your speech or vision.  You can no longer feel a vibration or buzz when you put your fingers over your dialysis fistula.  The limb that was used for the procedure: ? Swells. ? Is painful. ? Is cold. ? Is discolored, such as blue or pale white.  You have chest pain or shortness of breath. Summary  After a dialysis fistulogram, it is common to have a small amount of discomfort or bruising in the area where the small, thin tube (catheter)  was placed.  Rest at home on the day after your procedure. Return to your normal activities as told by your health care provider.  Take over-the-counter and prescription medicines only as told by your health care provider.  Follow instructions from your health care provider about how to take care of the site where the catheter was inserted.  Keep all follow-up visits as told by your health care provider. This information is not intended to replace advice given to you by your health care provider. Make sure you discuss any questions you have with your health care provider. Document Revised: 04/03/2017 Document Reviewed: 04/03/2017 Elsevier Patient Education  2020 Elsevier Inc.  

## 2019-04-28 NOTE — Op Note (Signed)
[facility]    OPERATIVE NOTE   PROCEDURE: 1.  Left axillary artery based HeRO graft cannulation under ultrasound guidance 2.  Left arm shuntogram 3.  Percutaneous transluminal angioplasty of the hero graft and the PTFE just before the grommet with a 7 mm diameter by 6 cm length Lutonix drug-coated angioplasty balloon  PRE-OPERATIVE DIAGNOSIS: 1. ESRD 2. Malfunctioning left axillary artery based hero graft  POST-OPERATIVE DIAGNOSIS: same as above   SURGEON: Leotis Pain, MD  ANESTHESIA: local with MCS  ESTIMATED BLOOD LOSS: 3 cc  FINDING(S): multiple previously placed stents with 70 to 75% stenosis of the hero graft and the PTFE portion just prior to the grommet.  Central venous portion was patent.  There were no other obvious hemodynamically significant stenosis within the graft.  SPECIMEN(S):  None  CONTRAST: 30 cc  FLUORO TIME: 1.8 minutes  MODERATE CONSCIOUS SEDATION TIME:  Approximately 15 minutes using 2 mg of Versed and 50 mcg of Fentanyl  INDICATIONS: [name] is a 44 y.o. female who presents with malfunctioning left axillary artery based hero graft.  The patient is scheduled for left arm shuntogram.  The patient is aware the risks include but are not limited to: bleeding, infection, thrombosis of the cannulated access, and possible anaphylactic reaction to the contrast.  The patient is aware of the risks of the procedure and elects to proceed forward.  DESCRIPTION: After full informed written consent was obtained, the patient was brought back to the angiography suite and placed supine upon the angiography table.  The patient was connected to monitoring equipment. Moderate conscious sedation was administered during a face to face encounter throughout the procedure with my supervision of the RN administering medicines and monitoring the patient's vital signs, pulse oximetry, telemetry and mental status throughout from the start of the procedure  until the patient was taken to the recovery room The left arm was prepped and draped in the standard fashion for a percutaneous access intervention.  Under ultrasound guidance, the initial portion of the left arm hero graft was cannulated with a micropuncture needle the arterial anastomosis in an antegrade fashion under direct ultrasound guidance were it was patent and a permanent image was performed.  The microwire was advanced into the graft and the needle was exchanged for the a microsheath.  I then upsized to a 6 Fr Sheath and imaging was performed.  Hand injections were completed to image the access including the central venous system. This demonstrated multiple previously placed stents with 70 to 75% stenosis of the hero graft and the PTFE portion just prior to the grommet.  Central venous portion was patent.  There were no other obvious hemodynamically significant stenosis within the graft.  Based on the images, this patient will need intervention to the stenosis. I then gave the patient 3000 units of intravenous heparin.  I then crossed the stenosis with a Magic Tourqe wire.  Based on the imaging, a 7 mm x 6 cm Lutonix drug-coated angioplasty balloon was selected.  The balloon was centered around the stenosis and inflated to 12 ATM for 1 minute(s).  On completion imaging, a less than 10% residual stenosis was present.     Based on the completion imaging, no further intervention is necessary.  The wire and balloon were removed from the sheath.  A 4-0 Monocryl purse-string suture was sewn around the sheath.  The sheath was removed while tying down the suture.  A sterile bandage was applied to the puncture site.  COMPLICATIONS: None  CONDITION: Stable   Leotis Pain  04/28/2019 10:10 AM    This note was created with Dragon Medical transcription system. Any errors in dictation are purely unintentional.

## 2019-04-29 DIAGNOSIS — Z992 Dependence on renal dialysis: Secondary | ICD-10-CM | POA: Diagnosis not present

## 2019-04-29 DIAGNOSIS — D509 Iron deficiency anemia, unspecified: Secondary | ICD-10-CM | POA: Diagnosis not present

## 2019-04-29 DIAGNOSIS — N186 End stage renal disease: Secondary | ICD-10-CM | POA: Diagnosis not present

## 2019-04-29 DIAGNOSIS — D631 Anemia in chronic kidney disease: Secondary | ICD-10-CM | POA: Diagnosis not present

## 2019-04-29 DIAGNOSIS — E559 Vitamin D deficiency, unspecified: Secondary | ICD-10-CM | POA: Diagnosis not present

## 2019-05-02 DIAGNOSIS — D631 Anemia in chronic kidney disease: Secondary | ICD-10-CM | POA: Diagnosis not present

## 2019-05-02 DIAGNOSIS — Z992 Dependence on renal dialysis: Secondary | ICD-10-CM | POA: Diagnosis not present

## 2019-05-02 DIAGNOSIS — D509 Iron deficiency anemia, unspecified: Secondary | ICD-10-CM | POA: Diagnosis not present

## 2019-05-02 DIAGNOSIS — N186 End stage renal disease: Secondary | ICD-10-CM | POA: Diagnosis not present

## 2019-05-02 DIAGNOSIS — E559 Vitamin D deficiency, unspecified: Secondary | ICD-10-CM | POA: Diagnosis not present

## 2019-05-04 DIAGNOSIS — D509 Iron deficiency anemia, unspecified: Secondary | ICD-10-CM | POA: Diagnosis not present

## 2019-05-04 DIAGNOSIS — E559 Vitamin D deficiency, unspecified: Secondary | ICD-10-CM | POA: Diagnosis not present

## 2019-05-04 DIAGNOSIS — N186 End stage renal disease: Secondary | ICD-10-CM | POA: Diagnosis not present

## 2019-05-04 DIAGNOSIS — Z992 Dependence on renal dialysis: Secondary | ICD-10-CM | POA: Diagnosis not present

## 2019-05-04 DIAGNOSIS — D631 Anemia in chronic kidney disease: Secondary | ICD-10-CM | POA: Diagnosis not present

## 2019-05-06 DIAGNOSIS — N186 End stage renal disease: Secondary | ICD-10-CM | POA: Diagnosis not present

## 2019-05-06 DIAGNOSIS — D631 Anemia in chronic kidney disease: Secondary | ICD-10-CM | POA: Diagnosis not present

## 2019-05-06 DIAGNOSIS — D509 Iron deficiency anemia, unspecified: Secondary | ICD-10-CM | POA: Diagnosis not present

## 2019-05-06 DIAGNOSIS — E559 Vitamin D deficiency, unspecified: Secondary | ICD-10-CM | POA: Diagnosis not present

## 2019-05-06 DIAGNOSIS — Z992 Dependence on renal dialysis: Secondary | ICD-10-CM | POA: Diagnosis not present

## 2019-05-09 DIAGNOSIS — D631 Anemia in chronic kidney disease: Secondary | ICD-10-CM | POA: Diagnosis not present

## 2019-05-09 DIAGNOSIS — N186 End stage renal disease: Secondary | ICD-10-CM | POA: Diagnosis not present

## 2019-05-09 DIAGNOSIS — E559 Vitamin D deficiency, unspecified: Secondary | ICD-10-CM | POA: Diagnosis not present

## 2019-05-09 DIAGNOSIS — Z992 Dependence on renal dialysis: Secondary | ICD-10-CM | POA: Diagnosis not present

## 2019-05-09 DIAGNOSIS — D509 Iron deficiency anemia, unspecified: Secondary | ICD-10-CM | POA: Diagnosis not present

## 2019-05-10 ENCOUNTER — Ambulatory Visit (INDEPENDENT_AMBULATORY_CARE_PROVIDER_SITE_OTHER): Payer: Medicare Other | Admitting: Vascular Surgery

## 2019-05-10 ENCOUNTER — Encounter (INDEPENDENT_AMBULATORY_CARE_PROVIDER_SITE_OTHER): Payer: Medicare Other

## 2019-05-11 DIAGNOSIS — D509 Iron deficiency anemia, unspecified: Secondary | ICD-10-CM | POA: Diagnosis not present

## 2019-05-11 DIAGNOSIS — E559 Vitamin D deficiency, unspecified: Secondary | ICD-10-CM | POA: Diagnosis not present

## 2019-05-11 DIAGNOSIS — N186 End stage renal disease: Secondary | ICD-10-CM | POA: Diagnosis not present

## 2019-05-11 DIAGNOSIS — D631 Anemia in chronic kidney disease: Secondary | ICD-10-CM | POA: Diagnosis not present

## 2019-05-11 DIAGNOSIS — Z992 Dependence on renal dialysis: Secondary | ICD-10-CM | POA: Diagnosis not present

## 2019-05-12 DIAGNOSIS — F329 Major depressive disorder, single episode, unspecified: Secondary | ICD-10-CM | POA: Diagnosis not present

## 2019-05-12 DIAGNOSIS — F419 Anxiety disorder, unspecified: Secondary | ICD-10-CM | POA: Diagnosis not present

## 2019-05-13 DIAGNOSIS — D631 Anemia in chronic kidney disease: Secondary | ICD-10-CM | POA: Diagnosis not present

## 2019-05-13 DIAGNOSIS — E559 Vitamin D deficiency, unspecified: Secondary | ICD-10-CM | POA: Diagnosis not present

## 2019-05-13 DIAGNOSIS — D509 Iron deficiency anemia, unspecified: Secondary | ICD-10-CM | POA: Diagnosis not present

## 2019-05-13 DIAGNOSIS — N186 End stage renal disease: Secondary | ICD-10-CM | POA: Diagnosis not present

## 2019-05-13 DIAGNOSIS — Z992 Dependence on renal dialysis: Secondary | ICD-10-CM | POA: Diagnosis not present

## 2019-05-15 DIAGNOSIS — N186 End stage renal disease: Secondary | ICD-10-CM | POA: Diagnosis not present

## 2019-05-15 DIAGNOSIS — Z992 Dependence on renal dialysis: Secondary | ICD-10-CM | POA: Diagnosis not present

## 2019-05-16 DIAGNOSIS — D509 Iron deficiency anemia, unspecified: Secondary | ICD-10-CM | POA: Diagnosis not present

## 2019-05-16 DIAGNOSIS — N186 End stage renal disease: Secondary | ICD-10-CM | POA: Diagnosis not present

## 2019-05-16 DIAGNOSIS — E559 Vitamin D deficiency, unspecified: Secondary | ICD-10-CM | POA: Diagnosis not present

## 2019-05-16 DIAGNOSIS — Z992 Dependence on renal dialysis: Secondary | ICD-10-CM | POA: Diagnosis not present

## 2019-05-16 DIAGNOSIS — D631 Anemia in chronic kidney disease: Secondary | ICD-10-CM | POA: Diagnosis not present

## 2019-05-17 DIAGNOSIS — Z23 Encounter for immunization: Secondary | ICD-10-CM | POA: Diagnosis not present

## 2019-05-18 DIAGNOSIS — Z992 Dependence on renal dialysis: Secondary | ICD-10-CM | POA: Diagnosis not present

## 2019-05-18 DIAGNOSIS — D631 Anemia in chronic kidney disease: Secondary | ICD-10-CM | POA: Diagnosis not present

## 2019-05-18 DIAGNOSIS — E559 Vitamin D deficiency, unspecified: Secondary | ICD-10-CM | POA: Diagnosis not present

## 2019-05-18 DIAGNOSIS — N186 End stage renal disease: Secondary | ICD-10-CM | POA: Diagnosis not present

## 2019-05-18 DIAGNOSIS — D509 Iron deficiency anemia, unspecified: Secondary | ICD-10-CM | POA: Diagnosis not present

## 2019-05-20 DIAGNOSIS — E559 Vitamin D deficiency, unspecified: Secondary | ICD-10-CM | POA: Diagnosis not present

## 2019-05-20 DIAGNOSIS — D631 Anemia in chronic kidney disease: Secondary | ICD-10-CM | POA: Diagnosis not present

## 2019-05-20 DIAGNOSIS — N186 End stage renal disease: Secondary | ICD-10-CM | POA: Diagnosis not present

## 2019-05-20 DIAGNOSIS — Z992 Dependence on renal dialysis: Secondary | ICD-10-CM | POA: Diagnosis not present

## 2019-05-20 DIAGNOSIS — D509 Iron deficiency anemia, unspecified: Secondary | ICD-10-CM | POA: Diagnosis not present

## 2019-05-23 ENCOUNTER — Emergency Department (HOSPITAL_COMMUNITY)
Admission: EM | Admit: 2019-05-23 | Discharge: 2019-05-23 | Disposition: A | Discharge status: Home / Self Care | Payer: Medicare Other | Attending: Emergency Medicine | Admitting: Emergency Medicine

## 2019-05-23 ENCOUNTER — Encounter (HOSPITAL_COMMUNITY): Payer: Self-pay

## 2019-05-23 ENCOUNTER — Other Ambulatory Visit: Payer: Self-pay

## 2019-05-23 DIAGNOSIS — N186 End stage renal disease: Secondary | ICD-10-CM | POA: Diagnosis not present

## 2019-05-23 DIAGNOSIS — I120 Hypertensive chronic kidney disease with stage 5 chronic kidney disease or end stage renal disease: Secondary | ICD-10-CM | POA: Diagnosis not present

## 2019-05-23 DIAGNOSIS — T8249XA Other complication of vascular dialysis catheter, initial encounter: Secondary | ICD-10-CM | POA: Diagnosis not present

## 2019-05-23 DIAGNOSIS — Z992 Dependence on renal dialysis: Secondary | ICD-10-CM | POA: Diagnosis not present

## 2019-05-23 DIAGNOSIS — T82838A Hemorrhage of vascular prosthetic devices, implants and grafts, initial encounter: Secondary | ICD-10-CM

## 2019-05-23 DIAGNOSIS — I959 Hypotension, unspecified: Secondary | ICD-10-CM | POA: Diagnosis not present

## 2019-05-23 DIAGNOSIS — Z79899 Other long term (current) drug therapy: Secondary | ICD-10-CM | POA: Diagnosis not present

## 2019-05-23 DIAGNOSIS — Y69 Unspecified misadventure during surgical and medical care: Secondary | ICD-10-CM | POA: Insufficient documentation

## 2019-05-23 DIAGNOSIS — R58 Hemorrhage, not elsewhere classified: Secondary | ICD-10-CM | POA: Diagnosis not present

## 2019-05-23 DIAGNOSIS — R609 Edema, unspecified: Secondary | ICD-10-CM | POA: Diagnosis not present

## 2019-05-23 NOTE — Discharge Instructions (Addendum)
As discussed, it is important that you follow-up with your nephrologist to discuss alternate sites for dialysis, and consider planning for this.  Otherwise, it is important that you follow-up with your dialysis center tomorrow.  Please monitor your condition carefully and do not hesitate to return here if you develop new, or concerning changes in your condition.

## 2019-05-23 NOTE — ED Triage Notes (Addendum)
[facility]- pt was at [facility] when dialysis shunt started bleeding. Pt unable to get dialysis. Bleeding controlled upon arrival to [facility] ED. Arm is bandaged.  Pt states she lives at an assisted living group home but is unable to recall the name of facility.

## 2019-05-23 NOTE — ED Provider Notes (Signed)
[facility] EMERGENCY DEPARTMENT Provider Note   CSN: [id number] Arrival date & time: 05/23/19  1619     History Chief Complaint  Patient presents with  . Fistula bleeding    [name] is a 44 y.o. female.  HPI   Patient presents from dialysis with concern for bleeding fistula.  She was in her usual state of health, getting ready to start her session when soon after the initial needle insertion she began to have bleeding around the dialysis site.  The session was stopped, the wound was wrapped, and she was sent here for evaluation.  She denies other complaints, states that she was ready for the session.  She perseverates on the bleeding, but does not indicate that it was painful, nor did she have any loss of function in the arm, and again she denies any other complaints.  Past Medical History:  Diagnosis Date  . Anemia   . BV (bacterial vaginosis) 11/08/2013  . Depression   . Dialysis patient ([place])   . DVT (deep venous thrombosis) ([place])   . Hypertension   . Lupus ([place])   . Renal disorder   . Vaginal odor 11/08/2013    Patient Active Problem List   Diagnosis Date Noted  . Diabetes ([place]) 11/02/2018  . Lipoma of extremity   . Hyperkalemia 10/23/2016  . Complication of vascular access for dialysis 04/01/2016  . Epilepsy, unspecified, not intractable, with status epilepticus ([place]) 10/01/2015  . Generalized muscle weakness 10/01/2015  . Vaginal discharge 11/08/2013  . Vaginal odor 11/08/2013  . BV (bacterial vaginosis) 11/08/2013  . End stage renal disease ([place]) 12/27/2010  . Anemia in chronic kidney disease 12/27/2010  . Major depressive disorder with single episode 12/27/2010  . Secondary hyperparathyroidism of renal origin ([place]) 12/27/2010  . Systemic lupus erythematosus, organ or system involvement unspecified ([place]) 12/27/2010  . Type 2 diabetes mellitus without complication ([place]) 40/98/1191  . SWELLING, NECK 04/02/2007  . ASTHMA 03/15/2007  . FOOT PAIN, RIGHT  03/15/2007  . COUGH 03/15/2007  . MENTAL RETARDATION, MODERATE 01/19/2007  . STATUS, MENTAL, ALTERED 07/16/2006  . ANOREXIA 05/26/2006  . Disease of pericardium 05/15/2006  . Tachycardia 05/15/2006  . Lupus ([place]) 04/01/2006  . ANEMIA-NOS 03/31/2006  . LEUKOCYTOSIS 03/31/2006  . DEPRESSION 03/31/2006  . MIGRAINE HEADACHE 03/31/2006  . PERIPHERAL NEUROPATHY 03/31/2006  . Essential (primary) hypertension 03/31/2006  . GERD 03/31/2006  . OSTEOARTHRITIS 03/31/2006  . HYPERGLYCEMIA 03/31/2006    Past Surgical History:  Procedure Laterality Date  . A/V SHUNT INTERVENTION N/A 10/24/2016   Procedure: A/V SHUNT INTERVENTION;  Surgeon: Katha Cabal, MD;  Location: [place] [facility];  Service: Cardiovascular;  Laterality: N/A;  . A/V SHUNTOGRAM Left 12/08/2017   Procedure: A/V SHUNTOGRAM;  Surgeon: Katha Cabal, MD;  Location: [place] [facility];  Service: Cardiovascular;  Laterality: Left;  . A/V SHUNTOGRAM Left 01/28/2018   Procedure: A/V SHUNTOGRAM;  Surgeon: Algernon Huxley, MD;  Location: [place] [facility];  Service: Cardiovascular;  Laterality: Left;  . A/V SHUNTOGRAM Left 04/01/2018   Procedure: A/V SHUNTOGRAM;  Surgeon: Algernon Huxley, MD;  Location: [place] [facility];  Service: Cardiovascular;  Laterality: Left;  . A/V SHUNTOGRAM Left 04/28/2019   Procedure: A/V SHUNTOGRAM;  Surgeon: Algernon Huxley, MD;  Location: [place] [facility];  Service: Cardiovascular;  Laterality: Left;  . AV FISTULA PLACEMENT    . DIALYSIS/PERMA CATHETER INSERTION  10/23/2016   Procedure: DIALYSIS/PERMA CATHETER INSERTION;  Surgeon: Algernon Huxley, MD;  Location: [place] [facility];  Service: Cardiovascular;;  . IR REMOVAL TUN CV CATH W/O FL  09/22/2017  . IR THROMBECTOMY AV FISTULA W/THROMBOLYSIS INC/SHUNT/IMG LEFT Left 09/04/2017  . IR US GUIDE VASC ACCESS LEFT  09/04/2017  . MASS EXCISION Left 06/01/2018   Procedure: EXCISION 5 X 3CM LIPOMA LEFT THIGH;  Surgeon: Aviva Signs, MD;  Location: [facility];  Service: General;  Laterality: Left;  . PERIPHERAL VASCULAR CATHETERIZATION Left 01/04/2015   Procedure: A/V Shuntogram/Fistulagram;  Surgeon: Algernon Huxley, MD;  Location: [place] [facility];  Service: Cardiovascular;  Laterality: Left;  . PERIPHERAL VASCULAR CATHETERIZATION N/A 01/04/2015   Procedure: A/V Shunt Intervention;  Surgeon: Algernon Huxley, MD;  Location: [place] [facility] LAB;  Service: Cardiovascular;  Laterality: N/A;  . PERIPHERAL VASCULAR CATHETERIZATION Left 04/13/2015   Procedure: A/V Shuntogram/Fistulagram;  Surgeon: Katha Cabal, MD;  Location: [place] [facility];  Service: Cardiovascular;  Laterality: Left;  . PERIPHERAL VASCULAR CATHETERIZATION Left 04/13/2015   Procedure: A/V Shunt Intervention;  Surgeon: Katha Cabal, MD;  Location: [place] [facility];  Service: Cardiovascular;  Laterality: Left;     OB History    Gravida  3   Para  3   Term  3   Preterm      AB      Living  2     SAB      TAB      Ectopic      Multiple      Live Births  2           Family History  Problem Relation Age of Onset  . Seizures Son   . Kidney disease Mother        on dialysis  . Lupus Mother     Social History   Tobacco Use  . Smoking status: Current Every Day Smoker    Packs/day: 0.25    Years: 15.00    Pack years: 3.75    Types: Cigarettes  . Smokeless tobacco: Never Used  Substance Use Topics  . Alcohol use: No  . Drug use: No    Home Medications Prior to Admission medications   Medication Sig Start Date End Date Taking? Authorizing Provider  albuterol (PROVENTIL) (2.5 MG/3ML) 0.083% nebulizer solution Take 2.5 mg by nebulization 4 (four) times daily as needed for wheezing or shortness of breath.     [provider]  ALPRAZolam Duanne Moron) 0.25 MG tablet Take 0.25 mg by mouth 2 (two) times daily.     [provider]  aspirin EC 81 MG tablet Take 81 mg by mouth daily.    [provider]  chlorhexidine  (PERIDEX) 0.12 % solution Use as directed 5 mLs in the mouth or throat 2 (two) times daily as needed.     [provider]  clopidogrel (PLAVIX) 75 MG tablet Take 75 mg by mouth daily.    [provider]  docusate sodium (COLACE) 100 MG capsule Take 100 mg by mouth daily.    [provider]  epoetin alfa (EPOGEN,PROCRIT) 4000 UNIT/ML injection Inject 4,000 Units into the vein See admin instructions. Mon, wed, and Friday at dialysis     [provider]  guaiFENesin (ROBITUSSIN) 100 MG/5ML liquid Take 200 mg by mouth every 6 (six) hours as needed for cough.     [provider]  HYDROcodone-acetaminophen (NORCO) 5-325 MG tablet Take 1 tablet by mouth every 6 (six) hours as needed for moderate pain. 06/01/18  Aviva Signs, MD  hydroxychloroquine (PLAQUENIL) 200 MG tablet Take 200 mg by mouth daily.    [provider]  ipratropium (ATROVENT) 0.02 % nebulizer solution Take 250 mcg by nebulization 4 (four) times daily as needed for wheezing or shortness of breath.     [provider]  lidocaine-prilocaine (EMLA) cream Apply 1 application topically as needed (port access).  12/20/12   [provider]  loratadine (CLARITIN) 10 MG tablet Take 10 mg by mouth daily as needed for allergies.    [provider]  midodrine (PROAMATINE) 10 MG tablet Take 10 mg by mouth 2 (two) times daily.     [provider]  mirtazapine (REMERON) 15 MG tablet Take 7.5 mg by mouth at bedtime.  11/03/13   [provider]  multivitamin (RENA-VIT) TABS tablet Take 1 tablet by mouth daily.    [provider]  pantoprazole (PROTONIX) 40 MG tablet Take 40 mg by mouth daily.    [provider]  phenytoin (DILANTIN) 100 MG ER capsule Take 100 mg by mouth 2 (two) times daily.     [provider]  sertraline (ZOLOFT) 100 MG tablet Take 200 mg by mouth daily.     [provider]  sevelamer carbonate (RENVELA) 800  MG tablet Take 800 mg by mouth See admin instructions. Take 800 mg by mouth three times daily with meals and 800 mg by mouth twice daily with snacks    [provider]  simvastatin (ZOCOR) 40 MG tablet Take 40 mg by mouth at bedtime.     [provider]    Allergies    Dust mite extract  Review of Systems   Review of Systems  Constitutional:       Per HPI, otherwise negative  HENT:       Per HPI, otherwise negative  Respiratory:       Per HPI, otherwise negative  Cardiovascular:       Per HPI, otherwise negative  Gastrointestinal: Negative for vomiting.  Endocrine:       Negative aside from HPI  Genitourinary:       Neg aside from HPI   Musculoskeletal:       Per HPI, otherwise negative  Skin: Negative.   Allergic/Immunologic: Positive for immunocompromised state.  Neurological: Negative for syncope.  Hematological: Bruises/bleeds easily.    Physical Exam Updated Vital Signs BP 108/69   Pulse 69   Temp 98.3 F (36.8 C) (Oral)   Resp 16   Ht 5\' 3"  (1.6 m)   Wt 45 kg   SpO2 100%   BMI 17.57 kg/m   Physical Exam Vitals and nursing note reviewed.  Constitutional:      General: She is not in acute distress.    Appearance: She is well-developed.  HENT:     Head: Normocephalic and atraumatic.  Eyes:     Conjunctiva/sclera: Conjunctivae normal.  Cardiovascular:     Rate and Rhythm: Normal rate and regular rhythm.     Pulses: Normal pulses.  Pulmonary:     Effort: Pulmonary effort is normal. No respiratory distress.     Breath sounds: No stridor.  Abdominal:     General: There is no distension.  Skin:    General: Skin is warm and dry.       Neurological:     Mental Status: She is alert and oriented to person, place, and time.     Cranial Nerves: No cranial nerve deficit.     ED  Results / Procedures / Treatments    Procedures Procedures (including critical care time)  After removing the dressing, wound was hemostatic and a new  dressing was applied.  ED Course  I have reviewed the triage vital signs and the nursing notes.  Pertinent labs & imaging results that were available during my care of the patient were reviewed by me and considered in my medical decision making (see chart for details).  This well-appearing female presents from dialysis after an aborted session secondary to bleeding from her dialysis fistula site.  Patient is awake, alert, afebrile, hemodynamically unremarkable, has no complaints and the wound itself is hemostatic, remained so for several hours in the ED with dressing applied.  Absent other complaints, patient appropriate for discharge with next day dialysis session.  She is aware of the need to follow-up with her nephrologist for consideration of alternative sites. Final Clinical Impression(s) / ED Diagnoses Final diagnoses:  Bleeding from dialysis shunt, initial encounter [facility])     Carmin Muskrat, MD 05/23/19 [phone]

## 2019-05-24 ENCOUNTER — Telehealth (INDEPENDENT_AMBULATORY_CARE_PROVIDER_SITE_OTHER): Payer: Self-pay

## 2019-05-24 NOTE — Telephone Encounter (Signed)
Heather from [place] left a voicemail stating that the patient had bleed through cannulation for 45 minutes and was sent to the ED. Heather want to see if the patient can be seen sooner then her schedule appointment on 06/07/19. I spoke with Dr Lucky Cowboy and he recommend for the patient appointment to moved up to this week or earlier next week. The patient is schedule to come in 05/26/19.

## 2019-05-25 ENCOUNTER — Encounter (INDEPENDENT_AMBULATORY_CARE_PROVIDER_SITE_OTHER): Payer: Self-pay | Admitting: Nurse Practitioner

## 2019-05-25 ENCOUNTER — Other Ambulatory Visit: Payer: Self-pay

## 2019-05-25 ENCOUNTER — Ambulatory Visit (INDEPENDENT_AMBULATORY_CARE_PROVIDER_SITE_OTHER): Payer: Medicare Other | Admitting: Nurse Practitioner

## 2019-05-25 ENCOUNTER — Ambulatory Visit (INDEPENDENT_AMBULATORY_CARE_PROVIDER_SITE_OTHER): Payer: Medicare Other

## 2019-05-25 VITALS — BP 102/69 | HR 80 | Resp 12 | Ht 65.0 in | Wt 113.0 lb

## 2019-05-25 DIAGNOSIS — E559 Vitamin D deficiency, unspecified: Secondary | ICD-10-CM | POA: Diagnosis not present

## 2019-05-25 DIAGNOSIS — I10 Essential (primary) hypertension: Secondary | ICD-10-CM

## 2019-05-25 DIAGNOSIS — Z992 Dependence on renal dialysis: Secondary | ICD-10-CM | POA: Diagnosis not present

## 2019-05-25 DIAGNOSIS — D631 Anemia in chronic kidney disease: Secondary | ICD-10-CM | POA: Diagnosis not present

## 2019-05-25 DIAGNOSIS — D509 Iron deficiency anemia, unspecified: Secondary | ICD-10-CM | POA: Diagnosis not present

## 2019-05-25 DIAGNOSIS — F71 Moderate intellectual disabilities: Secondary | ICD-10-CM

## 2019-05-25 DIAGNOSIS — N186 End stage renal disease: Secondary | ICD-10-CM

## 2019-05-26 ENCOUNTER — Ambulatory Visit (INDEPENDENT_AMBULATORY_CARE_PROVIDER_SITE_OTHER): Payer: Medicare Other | Admitting: Nurse Practitioner

## 2019-05-26 ENCOUNTER — Encounter (INDEPENDENT_AMBULATORY_CARE_PROVIDER_SITE_OTHER): Payer: Medicare Other

## 2019-05-27 DIAGNOSIS — D509 Iron deficiency anemia, unspecified: Secondary | ICD-10-CM | POA: Diagnosis not present

## 2019-05-27 DIAGNOSIS — Z992 Dependence on renal dialysis: Secondary | ICD-10-CM | POA: Diagnosis not present

## 2019-05-27 DIAGNOSIS — D631 Anemia in chronic kidney disease: Secondary | ICD-10-CM | POA: Diagnosis not present

## 2019-05-27 DIAGNOSIS — N186 End stage renal disease: Secondary | ICD-10-CM | POA: Diagnosis not present

## 2019-05-27 DIAGNOSIS — E559 Vitamin D deficiency, unspecified: Secondary | ICD-10-CM | POA: Diagnosis not present

## 2019-05-30 ENCOUNTER — Encounter (INDEPENDENT_AMBULATORY_CARE_PROVIDER_SITE_OTHER): Payer: Self-pay | Admitting: Nurse Practitioner

## 2019-05-30 DIAGNOSIS — D631 Anemia in chronic kidney disease: Secondary | ICD-10-CM | POA: Diagnosis not present

## 2019-05-30 DIAGNOSIS — Z992 Dependence on renal dialysis: Secondary | ICD-10-CM | POA: Diagnosis not present

## 2019-05-30 DIAGNOSIS — E559 Vitamin D deficiency, unspecified: Secondary | ICD-10-CM | POA: Diagnosis not present

## 2019-05-30 DIAGNOSIS — D509 Iron deficiency anemia, unspecified: Secondary | ICD-10-CM | POA: Diagnosis not present

## 2019-05-30 DIAGNOSIS — N186 End stage renal disease: Secondary | ICD-10-CM | POA: Diagnosis not present

## 2019-05-30 NOTE — Progress Notes (Signed)
SUBJECTIVE:  Patient ID: [name], female    DOB: Oct 10, 1975, 45 y.o.   MRN: [id number] Chief Complaint  Patient presents with  . Follow-up    60mo HDA    HPI  [name] is a 44 y.o. female that presents today for evaluation of her fistula due to bleeding.  The patient has her guardian present and she presents the majority of the information.  The patient had an incident with bleeding several weeks back however they were able to get that stopped at the dialysis center.  The patient had a subsequent bleed several days ago and that required a trip to the emergency room in order to get it to stop.  The guardian notes that this is happened with certain dialysis technicians whenever she has bleeding.  Otherwise she generally does not.  She denies any fever, chills, nausea, vomiting or diarrhea.    Today noninvasive study showed that the patient has a flow volume of 674.  Her AV graft is patent however there is no area of significant stenosis seen.  Past Medical History:  Diagnosis Date  . Anemia   . BV (bacterial vaginosis) 11/08/2013  . Depression   . Dialysis patient ([place])   . DVT (deep venous thrombosis) ([place])   . Hypertension   . Lupus ([place])   . Renal disorder   . Vaginal odor 11/08/2013    Past Surgical History:  Procedure Laterality Date  . A/V SHUNT INTERVENTION N/A 10/24/2016   Procedure: A/V SHUNT INTERVENTION;  Surgeon: Katha Cabal, MD;  Location: [place] [facility];  Service: Cardiovascular;  Laterality: N/A;  . A/V SHUNTOGRAM Left 12/08/2017   Procedure: A/V SHUNTOGRAM;  Surgeon: Katha Cabal, MD;  Location: [place] [facility];  Service: Cardiovascular;  Laterality: Left;  . A/V SHUNTOGRAM Left 01/28/2018   Procedure: A/V SHUNTOGRAM;  Surgeon: Algernon Huxley, MD;  Location: [place] [facility];  Service: Cardiovascular;  Laterality: Left;  . A/V SHUNTOGRAM Left 04/01/2018   Procedure: A/V SHUNTOGRAM;  Surgeon: Algernon Huxley, MD;  Location: [place] [facility];  Service: Cardiovascular;  Laterality: Left;  . A/V SHUNTOGRAM Left 04/28/2019   Procedure: A/V SHUNTOGRAM;  Surgeon: Algernon Huxley, MD;  Location: [place] [facility];  Service: Cardiovascular;  Laterality: Left;  . AV FISTULA PLACEMENT    . DIALYSIS/PERMA CATHETER INSERTION  10/23/2016   Procedure: DIALYSIS/PERMA CATHETER INSERTION;  Surgeon: Algernon Huxley, MD;  Location: [place] [facility];  Service: Cardiovascular;;  . IR REMOVAL TUN CV CATH W/O FL  09/22/2017  . IR THROMBECTOMY AV FISTULA W/THROMBOLYSIS INC/SHUNT/IMG LEFT Left 09/04/2017  . IR US GUIDE VASC ACCESS LEFT  09/04/2017  . MASS EXCISION Left 06/01/2018   Procedure: EXCISION 5 X 3CM LIPOMA LEFT THIGH;  Surgeon: Aviva Signs, MD;  Location: [facility];  Service: General;  Laterality: Left;  . PERIPHERAL VASCULAR CATHETERIZATION Left 01/04/2015   Procedure: A/V Shuntogram/Fistulagram;  Surgeon: Algernon Huxley, MD;  Location: [place] [facility];  Service: Cardiovascular;  Laterality: Left;  . PERIPHERAL VASCULAR CATHETERIZATION N/A 01/04/2015   Procedure: A/V Shunt Intervention;  Surgeon: Algernon Huxley, MD;  Location: [place] [facility];  Service: Cardiovascular;  Laterality: N/A;  . PERIPHERAL VASCULAR CATHETERIZATION Left 04/13/2015   Procedure: A/V Shuntogram/Fistulagram;  Surgeon: Katha Cabal, MD;  Location: [place] [facility];  Service: Cardiovascular;  Laterality: Left;  . PERIPHERAL VASCULAR CATHETERIZATION Left 04/13/2015   Procedure: A/V Shunt Intervention;  Surgeon: Belenda Cruise  Eloise Levels, MD;  Location: [place] [facility];  Service: Cardiovascular;  Laterality: Left;    Social History   Socioeconomic History  . Marital status: Single    Spouse name: Not on file  . Number of children: Not on file  . Years of education: Not on file  . Highest education level: Not on file  Occupational History  . Not on file  Tobacco Use  . Smoking status: Current Every Day Smoker    Packs/day: 0.25    Years: 15.00     Pack years: 3.75    Types: Cigarettes  . Smokeless tobacco: Never Used  Substance and Sexual Activity  . Alcohol use: No  . Drug use: No  . Sexual activity: Not Currently    Birth control/protection: None  Other Topics Concern  . Not on file  Social History Narrative   Lives at [facility]   Social Determinants of Health   Financial Resource Strain:   . Difficulty of Paying Living Expenses:   Food Insecurity:   . Worried About Charity fundraiser in the Last Year:   . Arboriculturist in the Last Year:   Transportation Needs:   . Film/video editor (Medical):   Marland Kitchen Lack of Transportation (Non-Medical):   Physical Activity:   . Days of Exercise per Week:   . Minutes of Exercise per Session:   Stress:   . Feeling of Stress :   Social Connections:   . Frequency of Communication with Friends and Family:   . Frequency of Social Gatherings with Friends and Family:   . Attends Religious Services:   . Active Member of Clubs or Organizations:   . Attends Archivist Meetings:   Marland Kitchen Marital Status:   Intimate Partner Violence:   . Fear of Current or Ex-Partner:   . Emotionally Abused:   Marland Kitchen Physically Abused:   . Sexually Abused:     Family History  Problem Relation Age of Onset  . Seizures Son   . Kidney disease Mother        on dialysis  . Lupus Mother     Allergies  Allergen Reactions  . Dust Mite Extract Other (See Comments)    sneezing     Review of Systems   Review of Systems: Negative Unless Checked Constitutional: [] Weight loss  [] Fever  [] Chills Cardiac: [] Chest pain   []  Atrial Fibrillation  [] Palpitations   [] Shortness of breath when laying flat   [] Shortness of breath with exertion. [] Shortness of breath at rest Vascular:  [] Pain in legs with walking   [] Pain in legs with standing [] Pain in legs when laying flat   [] Claudication    [] Pain in feet when laying flat    [] History of DVT   [] Phlebitis   [] Swelling in legs   [] Varicose veins    [] Non-healing ulcers Pulmonary:   [] Uses home oxygen   [] Productive cough   [] Hemoptysis   [] Wheeze  [] COPD   [] Asthma Neurologic:  [] Dizziness   [] Seizures  [] Blackouts [] History of stroke   [] History of TIA  [] Aphasia   [] Temporary Blindness   [] Weakness or numbness in arm   [] Weakness or numbness in leg Musculoskeletal:   [] Joint swelling   [] Joint pain   [] Low back pain  []  History of Knee Replacement [x] Arthritis [] back Surgeries  []  Spinal Stenosis    Hematologic:  [] Easy bruising  [] Easy bleeding   [] Hypercoagulable state   [x] Anemic Gastrointestinal:  [] Diarrhea   []   Vomiting  [x] Gastroesophageal reflux/heartburn   [] Difficulty swallowing. [] Abdominal pain Genitourinary:  [x] Chronic kidney disease   [] Difficult urination  [] Anuric   [] Blood in urine [] Frequent urination  [] Burning with urination   [] Hematuria Skin:  [] Rashes   [] Ulcers [] Wounds Psychological:  [] History of anxiety   [x]  History of major depression  [x]  Memory Difficulties      OBJECTIVE:   Physical Exam  BP 102/69   Pulse 80   Resp 12   Ht 5\' 5"  (1.651 m)   Wt 113 lb (51.3 kg)   BMI 18.80 kg/m   Gen: WD/WN, NAD Head: [place]/AT, No temporalis wasting.  Ear/Nose/Throat: Hearing grossly intact, nares w/o erythema or drainage Eyes: PER, EOMI, sclera nonicteric.  Neck: Supple, no masses.  No JVD.  Pulmonary:  Good air movement, no use of accessory muscles.  Cardiac: RRR Vascular:  Good thrill and bruit Vessel Right Left  Radial Palpable Palpable   Gastrointestinal: soft, non-distended. No guarding/no peritoneal signs.  Musculoskeletal: M/S 5/5 throughout.  No deformity or atrophy.  Neurologic: Pain and light touch intact in extremities.  Symmetrical.  Speech is fluent. Motor exam as listed above. Psychiatric: Judgment intact, Mood & affect appropriate for pt's clinical situation. Dermatologic: No Venous rashes. No Ulcers Noted.  No changes consistent with cellulitis. Lymph : No Cervical lymphadenopathy, no  lichenification or skin changes of chronic lymphedema.       ASSESSMENT AND PLAN:  1. End stage renal disease ([place]) After having a discussion with the patient as well as her legal guardian, they do not wish to proceed with a fistulogram at this time.  There is the possibility that the bleeding is because more so by certain technicians versus an issue with the fistula.  They will try to switch technicians for the next few weeks to see if the bleeding recurs.  We will have the patient return to the office in 6 weeks for repeat HDA. - VAS US DUPLEX DIALYSIS ACCESS (AVF,AVG)  2. Essential (primary) hypertension Continue antihypertensive medications as already ordered, these medications have been reviewed and there are no changes at this time.   3. MENTAL RETARDATION, MODERATE Patient had a legal guardian here to help with decision-making process.   Current Outpatient Medications on File Prior to Visit  Medication Sig Dispense Refill  . albuterol (PROVENTIL) (2.5 MG/3ML) 0.083% nebulizer solution Take 2.5 mg by nebulization 4 (four) times daily as needed for wheezing or shortness of breath.     . ALPRAZolam (XANAX) 0.25 MG tablet Take 0.25 mg by mouth 2 (two) times daily.     Marland Kitchen aspirin EC 81 MG tablet Take 81 mg by mouth daily.    . chlorhexidine (PERIDEX) 0.12 % solution Use as directed 5 mLs in the mouth or throat 2 (two) times daily as needed.     . clopidogrel (PLAVIX) 75 MG tablet Take 75 mg by mouth daily.    Marland Kitchen docusate sodium (COLACE) 100 MG capsule Take 100 mg by mouth daily.    Marland Kitchen epoetin alfa (EPOGEN,PROCRIT) 4000 UNIT/ML injection Inject 4,000 Units into the vein See admin instructions. Mon, wed, and Friday at dialysis     . guaiFENesin (ROBITUSSIN) 100 MG/5ML liquid Take 200 mg by mouth every 6 (six) hours as needed for cough.     Marland Kitchen HYDROcodone-acetaminophen (NORCO) 5-325 MG tablet Take 1 tablet by mouth every 6 (six) hours as needed for moderate pain. 20 tablet 0  .  hydroxychloroquine (PLAQUENIL) 200 MG tablet Take 200 mg by mouth  daily.    . ipratropium (ATROVENT) 0.02 % nebulizer solution Take 250 mcg by nebulization 4 (four) times daily as needed for wheezing or shortness of breath.     . lidocaine-prilocaine (EMLA) cream Apply 1 application topically as needed (port access).     Marland Kitchen loratadine (CLARITIN) 10 MG tablet Take 10 mg by mouth daily as needed for allergies.    . midodrine (PROAMATINE) 10 MG tablet Take 10 mg by mouth 2 (two) times daily.     . mirtazapine (REMERON) 15 MG tablet Take 7.5 mg by mouth at bedtime.     . multivitamin (RENA-VIT) TABS tablet Take 1 tablet by mouth daily.    . pantoprazole (PROTONIX) 40 MG tablet Take 40 mg by mouth daily.    . phenytoin (DILANTIN) 100 MG ER capsule Take 100 mg by mouth 2 (two) times daily.     . sertraline (ZOLOFT) 100 MG tablet Take 200 mg by mouth daily.     . sevelamer carbonate (RENVELA) 800 MG tablet Take 800 mg by mouth See admin instructions. Take 800 mg by mouth three times daily with meals and 800 mg by mouth twice daily with snacks    . simvastatin (ZOCOR) 40 MG tablet Take 40 mg by mouth at bedtime.      No current facility-administered medications on file prior to visit.    There are no Patient Instructions on file for this visit. No follow-ups on file.   Kris Hartmann, NP  This note was completed with Sales executive.  Any errors are purely unintentional.

## 2019-06-01 DIAGNOSIS — D509 Iron deficiency anemia, unspecified: Secondary | ICD-10-CM | POA: Diagnosis not present

## 2019-06-01 DIAGNOSIS — D631 Anemia in chronic kidney disease: Secondary | ICD-10-CM | POA: Diagnosis not present

## 2019-06-01 DIAGNOSIS — N186 End stage renal disease: Secondary | ICD-10-CM | POA: Diagnosis not present

## 2019-06-01 DIAGNOSIS — Z992 Dependence on renal dialysis: Secondary | ICD-10-CM | POA: Diagnosis not present

## 2019-06-01 DIAGNOSIS — E559 Vitamin D deficiency, unspecified: Secondary | ICD-10-CM | POA: Diagnosis not present

## 2019-06-03 DIAGNOSIS — N186 End stage renal disease: Secondary | ICD-10-CM | POA: Diagnosis not present

## 2019-06-03 DIAGNOSIS — E559 Vitamin D deficiency, unspecified: Secondary | ICD-10-CM | POA: Diagnosis not present

## 2019-06-03 DIAGNOSIS — D509 Iron deficiency anemia, unspecified: Secondary | ICD-10-CM | POA: Diagnosis not present

## 2019-06-03 DIAGNOSIS — D631 Anemia in chronic kidney disease: Secondary | ICD-10-CM | POA: Diagnosis not present

## 2019-06-03 DIAGNOSIS — Z992 Dependence on renal dialysis: Secondary | ICD-10-CM | POA: Diagnosis not present

## 2019-06-06 DIAGNOSIS — N186 End stage renal disease: Secondary | ICD-10-CM | POA: Diagnosis not present

## 2019-06-06 DIAGNOSIS — E559 Vitamin D deficiency, unspecified: Secondary | ICD-10-CM | POA: Diagnosis not present

## 2019-06-06 DIAGNOSIS — D509 Iron deficiency anemia, unspecified: Secondary | ICD-10-CM | POA: Diagnosis not present

## 2019-06-06 DIAGNOSIS — Z992 Dependence on renal dialysis: Secondary | ICD-10-CM | POA: Diagnosis not present

## 2019-06-06 DIAGNOSIS — D631 Anemia in chronic kidney disease: Secondary | ICD-10-CM | POA: Diagnosis not present

## 2019-06-07 ENCOUNTER — Ambulatory Visit (INDEPENDENT_AMBULATORY_CARE_PROVIDER_SITE_OTHER): Payer: Medicare Other | Admitting: Vascular Surgery

## 2019-06-07 ENCOUNTER — Encounter (INDEPENDENT_AMBULATORY_CARE_PROVIDER_SITE_OTHER): Payer: Medicare Other

## 2019-06-08 DIAGNOSIS — N186 End stage renal disease: Secondary | ICD-10-CM | POA: Diagnosis not present

## 2019-06-08 DIAGNOSIS — D631 Anemia in chronic kidney disease: Secondary | ICD-10-CM | POA: Diagnosis not present

## 2019-06-08 DIAGNOSIS — E559 Vitamin D deficiency, unspecified: Secondary | ICD-10-CM | POA: Diagnosis not present

## 2019-06-08 DIAGNOSIS — Z992 Dependence on renal dialysis: Secondary | ICD-10-CM | POA: Diagnosis not present

## 2019-06-08 DIAGNOSIS — D509 Iron deficiency anemia, unspecified: Secondary | ICD-10-CM | POA: Diagnosis not present

## 2019-06-09 DIAGNOSIS — Z79899 Other long term (current) drug therapy: Secondary | ICD-10-CM | POA: Diagnosis not present

## 2019-06-09 DIAGNOSIS — F329 Major depressive disorder, single episode, unspecified: Secondary | ICD-10-CM | POA: Diagnosis not present

## 2019-06-09 DIAGNOSIS — F419 Anxiety disorder, unspecified: Secondary | ICD-10-CM | POA: Diagnosis not present

## 2019-06-10 DIAGNOSIS — Z992 Dependence on renal dialysis: Secondary | ICD-10-CM | POA: Diagnosis not present

## 2019-06-10 DIAGNOSIS — D509 Iron deficiency anemia, unspecified: Secondary | ICD-10-CM | POA: Diagnosis not present

## 2019-06-10 DIAGNOSIS — D631 Anemia in chronic kidney disease: Secondary | ICD-10-CM | POA: Diagnosis not present

## 2019-06-10 DIAGNOSIS — N186 End stage renal disease: Secondary | ICD-10-CM | POA: Diagnosis not present

## 2019-06-10 DIAGNOSIS — E559 Vitamin D deficiency, unspecified: Secondary | ICD-10-CM | POA: Diagnosis not present

## 2019-06-13 DIAGNOSIS — E559 Vitamin D deficiency, unspecified: Secondary | ICD-10-CM | POA: Diagnosis not present

## 2019-06-13 DIAGNOSIS — D631 Anemia in chronic kidney disease: Secondary | ICD-10-CM | POA: Diagnosis not present

## 2019-06-13 DIAGNOSIS — N186 End stage renal disease: Secondary | ICD-10-CM | POA: Diagnosis not present

## 2019-06-13 DIAGNOSIS — D509 Iron deficiency anemia, unspecified: Secondary | ICD-10-CM | POA: Diagnosis not present

## 2019-06-13 DIAGNOSIS — Z992 Dependence on renal dialysis: Secondary | ICD-10-CM | POA: Diagnosis not present

## 2019-06-14 DIAGNOSIS — Z23 Encounter for immunization: Secondary | ICD-10-CM | POA: Diagnosis not present

## 2019-06-15 DIAGNOSIS — D509 Iron deficiency anemia, unspecified: Secondary | ICD-10-CM | POA: Diagnosis not present

## 2019-06-15 DIAGNOSIS — D631 Anemia in chronic kidney disease: Secondary | ICD-10-CM | POA: Diagnosis not present

## 2019-06-15 DIAGNOSIS — Z992 Dependence on renal dialysis: Secondary | ICD-10-CM | POA: Diagnosis not present

## 2019-06-15 DIAGNOSIS — E559 Vitamin D deficiency, unspecified: Secondary | ICD-10-CM | POA: Diagnosis not present

## 2019-06-15 DIAGNOSIS — N186 End stage renal disease: Secondary | ICD-10-CM | POA: Diagnosis not present

## 2019-06-17 DIAGNOSIS — N2581 Secondary hyperparathyroidism of renal origin: Secondary | ICD-10-CM | POA: Diagnosis not present

## 2019-06-17 DIAGNOSIS — Z992 Dependence on renal dialysis: Secondary | ICD-10-CM | POA: Diagnosis not present

## 2019-06-17 DIAGNOSIS — D509 Iron deficiency anemia, unspecified: Secondary | ICD-10-CM | POA: Diagnosis not present

## 2019-06-17 DIAGNOSIS — E559 Vitamin D deficiency, unspecified: Secondary | ICD-10-CM | POA: Diagnosis not present

## 2019-06-17 DIAGNOSIS — D631 Anemia in chronic kidney disease: Secondary | ICD-10-CM | POA: Diagnosis not present

## 2019-06-17 DIAGNOSIS — N186 End stage renal disease: Secondary | ICD-10-CM | POA: Diagnosis not present

## 2019-06-20 DIAGNOSIS — D631 Anemia in chronic kidney disease: Secondary | ICD-10-CM | POA: Diagnosis not present

## 2019-06-20 DIAGNOSIS — E559 Vitamin D deficiency, unspecified: Secondary | ICD-10-CM | POA: Diagnosis not present

## 2019-06-20 DIAGNOSIS — D509 Iron deficiency anemia, unspecified: Secondary | ICD-10-CM | POA: Diagnosis not present

## 2019-06-20 DIAGNOSIS — N2581 Secondary hyperparathyroidism of renal origin: Secondary | ICD-10-CM | POA: Diagnosis not present

## 2019-06-20 DIAGNOSIS — Z992 Dependence on renal dialysis: Secondary | ICD-10-CM | POA: Diagnosis not present

## 2019-06-20 DIAGNOSIS — N186 End stage renal disease: Secondary | ICD-10-CM | POA: Diagnosis not present

## 2019-06-22 DIAGNOSIS — N2581 Secondary hyperparathyroidism of renal origin: Secondary | ICD-10-CM | POA: Diagnosis not present

## 2019-06-22 DIAGNOSIS — D509 Iron deficiency anemia, unspecified: Secondary | ICD-10-CM | POA: Diagnosis not present

## 2019-06-22 DIAGNOSIS — N186 End stage renal disease: Secondary | ICD-10-CM | POA: Diagnosis not present

## 2019-06-22 DIAGNOSIS — Z992 Dependence on renal dialysis: Secondary | ICD-10-CM | POA: Diagnosis not present

## 2019-06-22 DIAGNOSIS — D631 Anemia in chronic kidney disease: Secondary | ICD-10-CM | POA: Diagnosis not present

## 2019-06-22 DIAGNOSIS — E559 Vitamin D deficiency, unspecified: Secondary | ICD-10-CM | POA: Diagnosis not present

## 2019-06-24 DIAGNOSIS — N2581 Secondary hyperparathyroidism of renal origin: Secondary | ICD-10-CM | POA: Diagnosis not present

## 2019-06-24 DIAGNOSIS — N186 End stage renal disease: Secondary | ICD-10-CM | POA: Diagnosis not present

## 2019-06-24 DIAGNOSIS — D631 Anemia in chronic kidney disease: Secondary | ICD-10-CM | POA: Diagnosis not present

## 2019-06-24 DIAGNOSIS — Z992 Dependence on renal dialysis: Secondary | ICD-10-CM | POA: Diagnosis not present

## 2019-06-24 DIAGNOSIS — E559 Vitamin D deficiency, unspecified: Secondary | ICD-10-CM | POA: Diagnosis not present

## 2019-06-24 DIAGNOSIS — D509 Iron deficiency anemia, unspecified: Secondary | ICD-10-CM | POA: Diagnosis not present

## 2019-06-27 DIAGNOSIS — D631 Anemia in chronic kidney disease: Secondary | ICD-10-CM | POA: Diagnosis not present

## 2019-06-27 DIAGNOSIS — E119 Type 2 diabetes mellitus without complications: Secondary | ICD-10-CM | POA: Diagnosis not present

## 2019-06-27 DIAGNOSIS — D509 Iron deficiency anemia, unspecified: Secondary | ICD-10-CM | POA: Diagnosis not present

## 2019-06-27 DIAGNOSIS — Z992 Dependence on renal dialysis: Secondary | ICD-10-CM | POA: Diagnosis not present

## 2019-06-27 DIAGNOSIS — E559 Vitamin D deficiency, unspecified: Secondary | ICD-10-CM | POA: Diagnosis not present

## 2019-06-27 DIAGNOSIS — N186 End stage renal disease: Secondary | ICD-10-CM | POA: Diagnosis not present

## 2019-06-27 DIAGNOSIS — N2581 Secondary hyperparathyroidism of renal origin: Secondary | ICD-10-CM | POA: Diagnosis not present

## 2019-06-29 DIAGNOSIS — N186 End stage renal disease: Secondary | ICD-10-CM | POA: Diagnosis not present

## 2019-06-29 DIAGNOSIS — Z992 Dependence on renal dialysis: Secondary | ICD-10-CM | POA: Diagnosis not present

## 2019-06-29 DIAGNOSIS — N2581 Secondary hyperparathyroidism of renal origin: Secondary | ICD-10-CM | POA: Diagnosis not present

## 2019-06-29 DIAGNOSIS — D631 Anemia in chronic kidney disease: Secondary | ICD-10-CM | POA: Diagnosis not present

## 2019-06-29 DIAGNOSIS — D509 Iron deficiency anemia, unspecified: Secondary | ICD-10-CM | POA: Diagnosis not present

## 2019-06-29 DIAGNOSIS — E559 Vitamin D deficiency, unspecified: Secondary | ICD-10-CM | POA: Diagnosis not present

## 2019-07-01 DIAGNOSIS — E559 Vitamin D deficiency, unspecified: Secondary | ICD-10-CM | POA: Diagnosis not present

## 2019-07-01 DIAGNOSIS — Z992 Dependence on renal dialysis: Secondary | ICD-10-CM | POA: Diagnosis not present

## 2019-07-01 DIAGNOSIS — N186 End stage renal disease: Secondary | ICD-10-CM | POA: Diagnosis not present

## 2019-07-01 DIAGNOSIS — N2581 Secondary hyperparathyroidism of renal origin: Secondary | ICD-10-CM | POA: Diagnosis not present

## 2019-07-01 DIAGNOSIS — D631 Anemia in chronic kidney disease: Secondary | ICD-10-CM | POA: Diagnosis not present

## 2019-07-01 DIAGNOSIS — D509 Iron deficiency anemia, unspecified: Secondary | ICD-10-CM | POA: Diagnosis not present

## 2019-07-04 DIAGNOSIS — Z992 Dependence on renal dialysis: Secondary | ICD-10-CM | POA: Diagnosis not present

## 2019-07-04 DIAGNOSIS — E559 Vitamin D deficiency, unspecified: Secondary | ICD-10-CM | POA: Diagnosis not present

## 2019-07-04 DIAGNOSIS — D631 Anemia in chronic kidney disease: Secondary | ICD-10-CM | POA: Diagnosis not present

## 2019-07-04 DIAGNOSIS — N186 End stage renal disease: Secondary | ICD-10-CM | POA: Diagnosis not present

## 2019-07-04 DIAGNOSIS — N2581 Secondary hyperparathyroidism of renal origin: Secondary | ICD-10-CM | POA: Diagnosis not present

## 2019-07-04 DIAGNOSIS — D509 Iron deficiency anemia, unspecified: Secondary | ICD-10-CM | POA: Diagnosis not present

## 2019-07-05 ENCOUNTER — Other Ambulatory Visit (INDEPENDENT_AMBULATORY_CARE_PROVIDER_SITE_OTHER): Payer: Self-pay | Admitting: Nurse Practitioner

## 2019-07-05 ENCOUNTER — Encounter (INDEPENDENT_AMBULATORY_CARE_PROVIDER_SITE_OTHER): Payer: Medicare Other

## 2019-07-05 ENCOUNTER — Ambulatory Visit (INDEPENDENT_AMBULATORY_CARE_PROVIDER_SITE_OTHER): Payer: Medicare Other | Admitting: Vascular Surgery

## 2019-07-05 DIAGNOSIS — N186 End stage renal disease: Secondary | ICD-10-CM

## 2019-07-06 DIAGNOSIS — N2581 Secondary hyperparathyroidism of renal origin: Secondary | ICD-10-CM | POA: Diagnosis not present

## 2019-07-06 DIAGNOSIS — N186 End stage renal disease: Secondary | ICD-10-CM | POA: Diagnosis not present

## 2019-07-06 DIAGNOSIS — D631 Anemia in chronic kidney disease: Secondary | ICD-10-CM | POA: Diagnosis not present

## 2019-07-06 DIAGNOSIS — Z992 Dependence on renal dialysis: Secondary | ICD-10-CM | POA: Diagnosis not present

## 2019-07-06 DIAGNOSIS — D509 Iron deficiency anemia, unspecified: Secondary | ICD-10-CM | POA: Diagnosis not present

## 2019-07-06 DIAGNOSIS — E559 Vitamin D deficiency, unspecified: Secondary | ICD-10-CM | POA: Diagnosis not present

## 2019-07-07 ENCOUNTER — Encounter (INDEPENDENT_AMBULATORY_CARE_PROVIDER_SITE_OTHER): Payer: Self-pay | Admitting: Nurse Practitioner

## 2019-07-07 ENCOUNTER — Ambulatory Visit (INDEPENDENT_AMBULATORY_CARE_PROVIDER_SITE_OTHER): Payer: Medicare Other | Admitting: Nurse Practitioner

## 2019-07-07 ENCOUNTER — Other Ambulatory Visit: Payer: Self-pay

## 2019-07-07 ENCOUNTER — Ambulatory Visit (INDEPENDENT_AMBULATORY_CARE_PROVIDER_SITE_OTHER): Payer: Medicare Other

## 2019-07-07 VITALS — BP 96/65 | HR 83 | Resp 16 | Ht 63.0 in | Wt 105.0 lb

## 2019-07-07 DIAGNOSIS — I10 Essential (primary) hypertension: Secondary | ICD-10-CM | POA: Diagnosis not present

## 2019-07-07 DIAGNOSIS — F71 Moderate intellectual disabilities: Secondary | ICD-10-CM

## 2019-07-07 DIAGNOSIS — N186 End stage renal disease: Secondary | ICD-10-CM

## 2019-07-08 DIAGNOSIS — N186 End stage renal disease: Secondary | ICD-10-CM | POA: Diagnosis not present

## 2019-07-08 DIAGNOSIS — Z992 Dependence on renal dialysis: Secondary | ICD-10-CM | POA: Diagnosis not present

## 2019-07-08 DIAGNOSIS — D631 Anemia in chronic kidney disease: Secondary | ICD-10-CM | POA: Diagnosis not present

## 2019-07-08 DIAGNOSIS — E559 Vitamin D deficiency, unspecified: Secondary | ICD-10-CM | POA: Diagnosis not present

## 2019-07-08 DIAGNOSIS — D509 Iron deficiency anemia, unspecified: Secondary | ICD-10-CM | POA: Diagnosis not present

## 2019-07-08 DIAGNOSIS — N2581 Secondary hyperparathyroidism of renal origin: Secondary | ICD-10-CM | POA: Diagnosis not present

## 2019-07-11 DIAGNOSIS — N186 End stage renal disease: Secondary | ICD-10-CM | POA: Diagnosis not present

## 2019-07-11 DIAGNOSIS — N2581 Secondary hyperparathyroidism of renal origin: Secondary | ICD-10-CM | POA: Diagnosis not present

## 2019-07-11 DIAGNOSIS — D631 Anemia in chronic kidney disease: Secondary | ICD-10-CM | POA: Diagnosis not present

## 2019-07-11 DIAGNOSIS — E559 Vitamin D deficiency, unspecified: Secondary | ICD-10-CM | POA: Diagnosis not present

## 2019-07-11 DIAGNOSIS — Z992 Dependence on renal dialysis: Secondary | ICD-10-CM | POA: Diagnosis not present

## 2019-07-11 DIAGNOSIS — D509 Iron deficiency anemia, unspecified: Secondary | ICD-10-CM | POA: Diagnosis not present

## 2019-07-12 ENCOUNTER — Encounter (INDEPENDENT_AMBULATORY_CARE_PROVIDER_SITE_OTHER): Payer: Self-pay | Admitting: Nurse Practitioner

## 2019-07-12 NOTE — Progress Notes (Signed)
Subjective:    Patient ID: [name], female    DOB: 1975/08/25, 44 y.o.   MRN: [id number] Chief Complaint  Patient presents with  . Follow-up    ultrasound follow up     The patient returns to the office for followup of their dialysis access. The function of the access has been stable. The patient denies increased bleeding time or increased recirculation. Patient denies difficulty with cannulation. The patient denies hand pain or other symptoms consistent with steal phenomena.  No significant arm swelling.  Previously the patient had been having issues with bleeding with cannulation however these issues have stopped.  The patient denies redness or swelling at the access site. The patient denies fever or chills at home or while on dialysis.  The patient denies amaurosis fugax or recent TIA symptoms. There are no recent neurological changes noted. The patient denies claudication symptoms or rest pain symptoms. The patient denies history of DVT, PE or superficial thrombophlebitis. The patient denies recent episodes of angina or shortness of breath.    Today the patient has a flow volume of 1279.  The hero graft has no evidence of stenosis.  The radial artery has antegrade flow.     Review of Systems  All other systems reviewed and are negative.      Objective:   Physical Exam Vitals reviewed. Exam conducted with a chaperone present (Guardian present).  Constitutional:      Appearance: Normal appearance.  Cardiovascular:     Rate and Rhythm: Normal rate and regular rhythm.     Pulses:          Radial pulses are 2+ on the left side.     Arteriovenous access: left arteriovenous access is present.    Comments: Left hero graft.  Good thrill and bruit Pulmonary:     Effort: Pulmonary effort is normal.  Neurological:     Mental Status: She is alert.  Psychiatric:        Attention and Perception: Attention normal.        Mood and Affect: Mood and affect normal.      Speech: Speech normal.        Cognition and Memory: Cognition is impaired.     BP 96/65 (BP Location: Right Arm)   Pulse 83   Resp 16   Ht 5\' 3"  (1.6 m)   Wt 105 lb (47.6 kg)   BMI 18.60 kg/m   Past Medical History:  Diagnosis Date  . Anemia   . BV (bacterial vaginosis) 11/08/2013  . Depression   . Dialysis patient ([place])   . DVT (deep venous thrombosis) ([place])   . Hypertension   . Lupus ([place])   . Renal disorder   . Vaginal odor 11/08/2013    Social History   Socioeconomic History  . Marital status: Single    Spouse name: Not on file  . Number of children: Not on file  . Years of education: Not on file  . Highest education level: Not on file  Occupational History  . Not on file  Tobacco Use  . Smoking status: Current Every Day Smoker    Packs/day: 0.25    Years: 15.00    Pack years: 3.75    Types: Cigarettes  . Smokeless tobacco: Never Used  Substance and Sexual Activity  . Alcohol use: No  . Drug use: No  . Sexual activity: Not Currently    Birth control/protection: None  Other Topics Concern  . Not on file  Social History Narrative   Lives at [facility]   Social Determinants of Health   Financial Resource Strain:   . Difficulty of Paying Living Expenses:   Food Insecurity:   . Worried About Charity fundraiser in the Last Year:   . Arboriculturist in the Last Year:   Transportation Needs:   . Film/video editor (Medical):   Marland Kitchen Lack of Transportation (Non-Medical):   Physical Activity:   . Days of Exercise per Week:   . Minutes of Exercise per Session:   Stress:   . Feeling of Stress :   Social Connections:   . Frequency of Communication with Friends and Family:   . Frequency of Social Gatherings with Friends and Family:   . Attends Religious Services:   . Active Member of Clubs or Organizations:   . Attends Archivist Meetings:   Marland Kitchen Marital Status:   Intimate Partner Violence:   . Fear of Current or Ex-Partner:   .  Emotionally Abused:   Marland Kitchen Physically Abused:   . Sexually Abused:     Past Surgical History:  Procedure Laterality Date  . A/V SHUNT INTERVENTION N/A 10/24/2016   Procedure: A/V SHUNT INTERVENTION;  Surgeon: Katha Cabal, MD;  Location: [place] [facility];  Service: Cardiovascular;  Laterality: N/A;  . A/V SHUNTOGRAM Left 12/08/2017   Procedure: A/V SHUNTOGRAM;  Surgeon: Katha Cabal, MD;  Location: [place] [facility];  Service: Cardiovascular;  Laterality: Left;  . A/V SHUNTOGRAM Left 01/28/2018   Procedure: A/V SHUNTOGRAM;  Surgeon: Algernon Huxley, MD;  Location: [place] [facility];  Service: Cardiovascular;  Laterality: Left;  . A/V SHUNTOGRAM Left 04/01/2018   Procedure: A/V SHUNTOGRAM;  Surgeon: Algernon Huxley, MD;  Location: [place] [facility];  Service: Cardiovascular;  Laterality: Left;  . A/V SHUNTOGRAM Left 04/28/2019   Procedure: A/V SHUNTOGRAM;  Surgeon: Algernon Huxley, MD;  Location: [place] [facility];  Service: Cardiovascular;  Laterality: Left;  . AV FISTULA PLACEMENT    . DIALYSIS/PERMA CATHETER INSERTION  10/23/2016   Procedure: DIALYSIS/PERMA CATHETER INSERTION;  Surgeon: Algernon Huxley, MD;  Location: [place] [facility];  Service: Cardiovascular;;  . IR REMOVAL TUN CV CATH W/O FL  09/22/2017  . IR THROMBECTOMY AV FISTULA W/THROMBOLYSIS INC/SHUNT/IMG LEFT Left 09/04/2017  . IR US GUIDE VASC ACCESS LEFT  09/04/2017  . MASS EXCISION Left 06/01/2018   Procedure: EXCISION 5 X 3CM LIPOMA LEFT THIGH;  Surgeon: Aviva Signs, MD;  Location: [facility];  Service: General;  Laterality: Left;  . PERIPHERAL VASCULAR CATHETERIZATION Left 01/04/2015   Procedure: A/V Shuntogram/Fistulagram;  Surgeon: Algernon Huxley, MD;  Location: [place] [facility];  Service: Cardiovascular;  Laterality: Left;  . PERIPHERAL VASCULAR CATHETERIZATION N/A 01/04/2015   Procedure: A/V Shunt Intervention;  Surgeon: Algernon Huxley, MD;  Location: [place] [facility];  Service: Cardiovascular;  Laterality: N/A;   . PERIPHERAL VASCULAR CATHETERIZATION Left 04/13/2015   Procedure: A/V Shuntogram/Fistulagram;  Surgeon: Katha Cabal, MD;  Location: [place] [facility];  Service: Cardiovascular;  Laterality: Left;  . PERIPHERAL VASCULAR CATHETERIZATION Left 04/13/2015   Procedure: A/V Shunt Intervention;  Surgeon: Katha Cabal, MD;  Location: [place] [facility];  Service: Cardiovascular;  Laterality: Left;    Family History  Problem Relation Age of Onset  . Seizures Son   . Kidney disease Mother        on dialysis  . Lupus  Mother     Allergies  Allergen Reactions  . Dust Mite Extract Other (See Comments)    sneezing       Assessment & Plan:   1. End stage renal disease ([place]) Recommend:  The patient is doing well and currently has adequate dialysis access. The patient's dialysis center is not reporting any access issues. Flow pattern is stable when compared to the prior ultrasound.  The patient should have a duplex ultrasound of the dialysis access in 6 months. The patient will follow-up with me in the office after each ultrasound     2. Essential (primary) hypertension Continue antihypertensive medications as already ordered, these medications have been reviewed and there are no changes at this time.   3. MENTAL RETARDATION, MODERATE Patient has guardian present today to help with decision-making capabilities.   Current Outpatient Medications on File Prior to Visit  Medication Sig Dispense Refill  . albuterol (PROVENTIL) (2.5 MG/3ML) 0.083% nebulizer solution Take 2.5 mg by nebulization 4 (four) times daily as needed for wheezing or shortness of breath.     . ALPRAZolam (XANAX) 0.25 MG tablet Take 0.25 mg by mouth 2 (two) times daily.     Marland Kitchen aspirin EC 81 MG tablet Take 81 mg by mouth daily.    . chlorhexidine (PERIDEX) 0.12 % solution Use as directed 5 mLs in the mouth or throat 2 (two) times daily as needed.     . clopidogrel (PLAVIX) 75 MG tablet Take 75 mg by mouth  daily.    Marland Kitchen docusate sodium (COLACE) 100 MG capsule Take 100 mg by mouth daily.    Marland Kitchen epoetin alfa (EPOGEN,PROCRIT) 4000 UNIT/ML injection Inject 4,000 Units into the vein See admin instructions. Mon, wed, and Friday at dialysis     . guaiFENesin (ROBITUSSIN) 100 MG/5ML liquid Take 200 mg by mouth every 6 (six) hours as needed for cough.     Marland Kitchen HYDROcodone-acetaminophen (NORCO) 5-325 MG tablet Take 1 tablet by mouth every 6 (six) hours as needed for moderate pain. 20 tablet 0  . hydroxychloroquine (PLAQUENIL) 200 MG tablet Take 200 mg by mouth daily.    Marland Kitchen ipratropium (ATROVENT) 0.02 % nebulizer solution Take 250 mcg by nebulization 4 (four) times daily as needed for wheezing or shortness of breath.     . lidocaine-prilocaine (EMLA) cream Apply 1 application topically as needed (port access).     Marland Kitchen loratadine (CLARITIN) 10 MG tablet Take 10 mg by mouth daily as needed for allergies.    . midodrine (PROAMATINE) 10 MG tablet Take 10 mg by mouth 2 (two) times daily.     . mirtazapine (REMERON) 15 MG tablet Take 7.5 mg by mouth at bedtime.     . multivitamin (RENA-VIT) TABS tablet Take 1 tablet by mouth daily.    . pantoprazole (PROTONIX) 40 MG tablet Take 40 mg by mouth daily.    . phenytoin (DILANTIN) 100 MG ER capsule Take 100 mg by mouth 2 (two) times daily.     . sertraline (ZOLOFT) 100 MG tablet Take 200 mg by mouth daily.     . sevelamer carbonate (RENVELA) 800 MG tablet Take 800 mg by mouth See admin instructions. Take 800 mg by mouth three times daily with meals and 800 mg by mouth twice daily with snacks    . simvastatin (ZOCOR) 40 MG tablet Take 40 mg by mouth at bedtime.      No current facility-administered medications on file prior to visit.    There are no  Patient Instructions on file for this visit. No follow-ups on file.   Kris Hartmann, NP

## 2019-07-13 DIAGNOSIS — N2581 Secondary hyperparathyroidism of renal origin: Secondary | ICD-10-CM | POA: Diagnosis not present

## 2019-07-13 DIAGNOSIS — E559 Vitamin D deficiency, unspecified: Secondary | ICD-10-CM | POA: Diagnosis not present

## 2019-07-13 DIAGNOSIS — N186 End stage renal disease: Secondary | ICD-10-CM | POA: Diagnosis not present

## 2019-07-13 DIAGNOSIS — D509 Iron deficiency anemia, unspecified: Secondary | ICD-10-CM | POA: Diagnosis not present

## 2019-07-13 DIAGNOSIS — D631 Anemia in chronic kidney disease: Secondary | ICD-10-CM | POA: Diagnosis not present

## 2019-07-13 DIAGNOSIS — Z992 Dependence on renal dialysis: Secondary | ICD-10-CM | POA: Diagnosis not present

## 2019-07-15 DIAGNOSIS — D631 Anemia in chronic kidney disease: Secondary | ICD-10-CM | POA: Diagnosis not present

## 2019-07-15 DIAGNOSIS — E559 Vitamin D deficiency, unspecified: Secondary | ICD-10-CM | POA: Diagnosis not present

## 2019-07-15 DIAGNOSIS — Z992 Dependence on renal dialysis: Secondary | ICD-10-CM | POA: Diagnosis not present

## 2019-07-15 DIAGNOSIS — D509 Iron deficiency anemia, unspecified: Secondary | ICD-10-CM | POA: Diagnosis not present

## 2019-07-15 DIAGNOSIS — N2581 Secondary hyperparathyroidism of renal origin: Secondary | ICD-10-CM | POA: Diagnosis not present

## 2019-07-15 DIAGNOSIS — N186 End stage renal disease: Secondary | ICD-10-CM | POA: Diagnosis not present

## 2019-07-18 DIAGNOSIS — D631 Anemia in chronic kidney disease: Secondary | ICD-10-CM | POA: Diagnosis not present

## 2019-07-18 DIAGNOSIS — D509 Iron deficiency anemia, unspecified: Secondary | ICD-10-CM | POA: Diagnosis not present

## 2019-07-18 DIAGNOSIS — N2581 Secondary hyperparathyroidism of renal origin: Secondary | ICD-10-CM | POA: Diagnosis not present

## 2019-07-18 DIAGNOSIS — N186 End stage renal disease: Secondary | ICD-10-CM | POA: Diagnosis not present

## 2019-07-18 DIAGNOSIS — E559 Vitamin D deficiency, unspecified: Secondary | ICD-10-CM | POA: Diagnosis not present

## 2019-07-18 DIAGNOSIS — Z992 Dependence on renal dialysis: Secondary | ICD-10-CM | POA: Diagnosis not present

## 2019-07-19 ENCOUNTER — Ambulatory Visit: Payer: Medicare Other | Admitting: Family Medicine

## 2019-07-20 DIAGNOSIS — N186 End stage renal disease: Secondary | ICD-10-CM | POA: Diagnosis not present

## 2019-07-20 DIAGNOSIS — D631 Anemia in chronic kidney disease: Secondary | ICD-10-CM | POA: Diagnosis not present

## 2019-07-20 DIAGNOSIS — Z992 Dependence on renal dialysis: Secondary | ICD-10-CM | POA: Diagnosis not present

## 2019-07-20 DIAGNOSIS — N2581 Secondary hyperparathyroidism of renal origin: Secondary | ICD-10-CM | POA: Diagnosis not present

## 2019-07-20 DIAGNOSIS — D509 Iron deficiency anemia, unspecified: Secondary | ICD-10-CM | POA: Diagnosis not present

## 2019-07-20 DIAGNOSIS — E559 Vitamin D deficiency, unspecified: Secondary | ICD-10-CM | POA: Diagnosis not present

## 2019-07-22 DIAGNOSIS — N2581 Secondary hyperparathyroidism of renal origin: Secondary | ICD-10-CM | POA: Diagnosis not present

## 2019-07-22 DIAGNOSIS — N186 End stage renal disease: Secondary | ICD-10-CM | POA: Diagnosis not present

## 2019-07-22 DIAGNOSIS — D631 Anemia in chronic kidney disease: Secondary | ICD-10-CM | POA: Diagnosis not present

## 2019-07-22 DIAGNOSIS — D509 Iron deficiency anemia, unspecified: Secondary | ICD-10-CM | POA: Diagnosis not present

## 2019-07-22 DIAGNOSIS — Z992 Dependence on renal dialysis: Secondary | ICD-10-CM | POA: Diagnosis not present

## 2019-07-22 DIAGNOSIS — E559 Vitamin D deficiency, unspecified: Secondary | ICD-10-CM | POA: Diagnosis not present

## 2019-07-25 DIAGNOSIS — N186 End stage renal disease: Secondary | ICD-10-CM | POA: Diagnosis not present

## 2019-07-25 DIAGNOSIS — D631 Anemia in chronic kidney disease: Secondary | ICD-10-CM | POA: Diagnosis not present

## 2019-07-25 DIAGNOSIS — E559 Vitamin D deficiency, unspecified: Secondary | ICD-10-CM | POA: Diagnosis not present

## 2019-07-25 DIAGNOSIS — N2581 Secondary hyperparathyroidism of renal origin: Secondary | ICD-10-CM | POA: Diagnosis not present

## 2019-07-25 DIAGNOSIS — Z992 Dependence on renal dialysis: Secondary | ICD-10-CM | POA: Diagnosis not present

## 2019-07-25 DIAGNOSIS — D509 Iron deficiency anemia, unspecified: Secondary | ICD-10-CM | POA: Diagnosis not present

## 2019-07-27 DIAGNOSIS — D509 Iron deficiency anemia, unspecified: Secondary | ICD-10-CM | POA: Diagnosis not present

## 2019-07-27 DIAGNOSIS — N2581 Secondary hyperparathyroidism of renal origin: Secondary | ICD-10-CM | POA: Diagnosis not present

## 2019-07-27 DIAGNOSIS — N186 End stage renal disease: Secondary | ICD-10-CM | POA: Diagnosis not present

## 2019-07-27 DIAGNOSIS — Z992 Dependence on renal dialysis: Secondary | ICD-10-CM | POA: Diagnosis not present

## 2019-07-27 DIAGNOSIS — E559 Vitamin D deficiency, unspecified: Secondary | ICD-10-CM | POA: Diagnosis not present

## 2019-07-27 DIAGNOSIS — D631 Anemia in chronic kidney disease: Secondary | ICD-10-CM | POA: Diagnosis not present

## 2019-07-29 DIAGNOSIS — D631 Anemia in chronic kidney disease: Secondary | ICD-10-CM | POA: Diagnosis not present

## 2019-07-29 DIAGNOSIS — Z992 Dependence on renal dialysis: Secondary | ICD-10-CM | POA: Diagnosis not present

## 2019-07-29 DIAGNOSIS — N2581 Secondary hyperparathyroidism of renal origin: Secondary | ICD-10-CM | POA: Diagnosis not present

## 2019-07-29 DIAGNOSIS — D509 Iron deficiency anemia, unspecified: Secondary | ICD-10-CM | POA: Diagnosis not present

## 2019-07-29 DIAGNOSIS — E559 Vitamin D deficiency, unspecified: Secondary | ICD-10-CM | POA: Diagnosis not present

## 2019-07-29 DIAGNOSIS — N186 End stage renal disease: Secondary | ICD-10-CM | POA: Diagnosis not present

## 2019-08-01 DIAGNOSIS — D509 Iron deficiency anemia, unspecified: Secondary | ICD-10-CM | POA: Diagnosis not present

## 2019-08-01 DIAGNOSIS — Z992 Dependence on renal dialysis: Secondary | ICD-10-CM | POA: Diagnosis not present

## 2019-08-01 DIAGNOSIS — E559 Vitamin D deficiency, unspecified: Secondary | ICD-10-CM | POA: Diagnosis not present

## 2019-08-01 DIAGNOSIS — N186 End stage renal disease: Secondary | ICD-10-CM | POA: Diagnosis not present

## 2019-08-01 DIAGNOSIS — D631 Anemia in chronic kidney disease: Secondary | ICD-10-CM | POA: Diagnosis not present

## 2019-08-01 DIAGNOSIS — N2581 Secondary hyperparathyroidism of renal origin: Secondary | ICD-10-CM | POA: Diagnosis not present

## 2019-08-03 DIAGNOSIS — N186 End stage renal disease: Secondary | ICD-10-CM | POA: Diagnosis not present

## 2019-08-03 DIAGNOSIS — D631 Anemia in chronic kidney disease: Secondary | ICD-10-CM | POA: Diagnosis not present

## 2019-08-03 DIAGNOSIS — D509 Iron deficiency anemia, unspecified: Secondary | ICD-10-CM | POA: Diagnosis not present

## 2019-08-03 DIAGNOSIS — N2581 Secondary hyperparathyroidism of renal origin: Secondary | ICD-10-CM | POA: Diagnosis not present

## 2019-08-03 DIAGNOSIS — Z992 Dependence on renal dialysis: Secondary | ICD-10-CM | POA: Diagnosis not present

## 2019-08-03 DIAGNOSIS — E559 Vitamin D deficiency, unspecified: Secondary | ICD-10-CM | POA: Diagnosis not present

## 2019-08-04 DIAGNOSIS — Z20828 Contact with and (suspected) exposure to other viral communicable diseases: Secondary | ICD-10-CM | POA: Diagnosis not present

## 2019-08-04 DIAGNOSIS — F419 Anxiety disorder, unspecified: Secondary | ICD-10-CM | POA: Diagnosis not present

## 2019-08-04 DIAGNOSIS — Z79899 Other long term (current) drug therapy: Secondary | ICD-10-CM | POA: Diagnosis not present

## 2019-08-04 DIAGNOSIS — F329 Major depressive disorder, single episode, unspecified: Secondary | ICD-10-CM | POA: Diagnosis not present

## 2019-08-05 DIAGNOSIS — N2581 Secondary hyperparathyroidism of renal origin: Secondary | ICD-10-CM | POA: Diagnosis not present

## 2019-08-05 DIAGNOSIS — E559 Vitamin D deficiency, unspecified: Secondary | ICD-10-CM | POA: Diagnosis not present

## 2019-08-05 DIAGNOSIS — D631 Anemia in chronic kidney disease: Secondary | ICD-10-CM | POA: Diagnosis not present

## 2019-08-05 DIAGNOSIS — D509 Iron deficiency anemia, unspecified: Secondary | ICD-10-CM | POA: Diagnosis not present

## 2019-08-05 DIAGNOSIS — Z992 Dependence on renal dialysis: Secondary | ICD-10-CM | POA: Diagnosis not present

## 2019-08-05 DIAGNOSIS — N186 End stage renal disease: Secondary | ICD-10-CM | POA: Diagnosis not present

## 2019-08-06 DIAGNOSIS — Z79899 Other long term (current) drug therapy: Secondary | ICD-10-CM | POA: Diagnosis not present

## 2019-08-08 DIAGNOSIS — E559 Vitamin D deficiency, unspecified: Secondary | ICD-10-CM | POA: Diagnosis not present

## 2019-08-08 DIAGNOSIS — D509 Iron deficiency anemia, unspecified: Secondary | ICD-10-CM | POA: Diagnosis not present

## 2019-08-08 DIAGNOSIS — N2581 Secondary hyperparathyroidism of renal origin: Secondary | ICD-10-CM | POA: Diagnosis not present

## 2019-08-08 DIAGNOSIS — Z992 Dependence on renal dialysis: Secondary | ICD-10-CM | POA: Diagnosis not present

## 2019-08-08 DIAGNOSIS — D631 Anemia in chronic kidney disease: Secondary | ICD-10-CM | POA: Diagnosis not present

## 2019-08-08 DIAGNOSIS — N186 End stage renal disease: Secondary | ICD-10-CM | POA: Diagnosis not present

## 2019-08-10 DIAGNOSIS — N2581 Secondary hyperparathyroidism of renal origin: Secondary | ICD-10-CM | POA: Diagnosis not present

## 2019-08-10 DIAGNOSIS — E559 Vitamin D deficiency, unspecified: Secondary | ICD-10-CM | POA: Diagnosis not present

## 2019-08-10 DIAGNOSIS — D631 Anemia in chronic kidney disease: Secondary | ICD-10-CM | POA: Diagnosis not present

## 2019-08-10 DIAGNOSIS — Z992 Dependence on renal dialysis: Secondary | ICD-10-CM | POA: Diagnosis not present

## 2019-08-10 DIAGNOSIS — D509 Iron deficiency anemia, unspecified: Secondary | ICD-10-CM | POA: Diagnosis not present

## 2019-08-10 DIAGNOSIS — N186 End stage renal disease: Secondary | ICD-10-CM | POA: Diagnosis not present

## 2019-08-12 DIAGNOSIS — N2581 Secondary hyperparathyroidism of renal origin: Secondary | ICD-10-CM | POA: Diagnosis not present

## 2019-08-12 DIAGNOSIS — Z992 Dependence on renal dialysis: Secondary | ICD-10-CM | POA: Diagnosis not present

## 2019-08-12 DIAGNOSIS — N186 End stage renal disease: Secondary | ICD-10-CM | POA: Diagnosis not present

## 2019-08-12 DIAGNOSIS — D509 Iron deficiency anemia, unspecified: Secondary | ICD-10-CM | POA: Diagnosis not present

## 2019-08-12 DIAGNOSIS — E559 Vitamin D deficiency, unspecified: Secondary | ICD-10-CM | POA: Diagnosis not present

## 2019-08-12 DIAGNOSIS — D631 Anemia in chronic kidney disease: Secondary | ICD-10-CM | POA: Diagnosis not present

## 2019-08-15 DIAGNOSIS — Z992 Dependence on renal dialysis: Secondary | ICD-10-CM | POA: Diagnosis not present

## 2019-08-15 DIAGNOSIS — D631 Anemia in chronic kidney disease: Secondary | ICD-10-CM | POA: Diagnosis not present

## 2019-08-15 DIAGNOSIS — N2581 Secondary hyperparathyroidism of renal origin: Secondary | ICD-10-CM | POA: Diagnosis not present

## 2019-08-15 DIAGNOSIS — D509 Iron deficiency anemia, unspecified: Secondary | ICD-10-CM | POA: Diagnosis not present

## 2019-08-15 DIAGNOSIS — E559 Vitamin D deficiency, unspecified: Secondary | ICD-10-CM | POA: Diagnosis not present

## 2019-08-15 DIAGNOSIS — N186 End stage renal disease: Secondary | ICD-10-CM | POA: Diagnosis not present

## 2019-08-18 ENCOUNTER — Other Ambulatory Visit (HOSPITAL_COMMUNITY): Payer: Self-pay | Admitting: Internal Medicine

## 2019-08-18 DIAGNOSIS — F341 Dysthymic disorder: Secondary | ICD-10-CM | POA: Diagnosis not present

## 2019-08-18 DIAGNOSIS — Z1389 Encounter for screening for other disorder: Secondary | ICD-10-CM | POA: Diagnosis not present

## 2019-08-18 DIAGNOSIS — Z1231 Encounter for screening mammogram for malignant neoplasm of breast: Secondary | ICD-10-CM

## 2019-08-18 DIAGNOSIS — N186 End stage renal disease: Secondary | ICD-10-CM | POA: Diagnosis not present

## 2019-08-18 DIAGNOSIS — M329 Systemic lupus erythematosus, unspecified: Secondary | ICD-10-CM | POA: Diagnosis not present

## 2019-08-18 DIAGNOSIS — Z1331 Encounter for screening for depression: Secondary | ICD-10-CM | POA: Diagnosis not present

## 2019-08-18 DIAGNOSIS — I959 Hypotension, unspecified: Secondary | ICD-10-CM | POA: Diagnosis not present

## 2019-08-29 DIAGNOSIS — Z992 Dependence on renal dialysis: Secondary | ICD-10-CM | POA: Diagnosis not present

## 2019-09-08 ENCOUNTER — Other Ambulatory Visit: Payer: Self-pay

## 2019-09-08 ENCOUNTER — Ambulatory Visit (HOSPITAL_COMMUNITY)
Admission: RE | Admit: 2019-09-08 | Discharge: 2019-09-08 | Disposition: A | Discharge status: Home / Self Care | Payer: Medicare Other | Source: Ambulatory Visit | Attending: Internal Medicine | Admitting: Internal Medicine

## 2019-09-08 DIAGNOSIS — Z1231 Encounter for screening mammogram for malignant neoplasm of breast: Secondary | ICD-10-CM | POA: Diagnosis not present

## 2019-09-16 DIAGNOSIS — D509 Iron deficiency anemia, unspecified: Secondary | ICD-10-CM | POA: Diagnosis not present

## 2019-09-16 DIAGNOSIS — N186 End stage renal disease: Secondary | ICD-10-CM | POA: Diagnosis not present

## 2019-09-16 DIAGNOSIS — Z992 Dependence on renal dialysis: Secondary | ICD-10-CM | POA: Diagnosis not present

## 2019-09-16 DIAGNOSIS — N2581 Secondary hyperparathyroidism of renal origin: Secondary | ICD-10-CM | POA: Diagnosis not present

## 2019-09-16 DIAGNOSIS — E559 Vitamin D deficiency, unspecified: Secondary | ICD-10-CM | POA: Diagnosis not present

## 2019-09-19 DIAGNOSIS — D509 Iron deficiency anemia, unspecified: Secondary | ICD-10-CM | POA: Diagnosis not present

## 2019-09-19 DIAGNOSIS — N186 End stage renal disease: Secondary | ICD-10-CM | POA: Diagnosis not present

## 2019-09-19 DIAGNOSIS — Z992 Dependence on renal dialysis: Secondary | ICD-10-CM | POA: Diagnosis not present

## 2019-09-19 DIAGNOSIS — N2581 Secondary hyperparathyroidism of renal origin: Secondary | ICD-10-CM | POA: Diagnosis not present

## 2019-09-19 DIAGNOSIS — E559 Vitamin D deficiency, unspecified: Secondary | ICD-10-CM | POA: Diagnosis not present

## 2019-09-21 DIAGNOSIS — E559 Vitamin D deficiency, unspecified: Secondary | ICD-10-CM | POA: Diagnosis not present

## 2019-09-21 DIAGNOSIS — D509 Iron deficiency anemia, unspecified: Secondary | ICD-10-CM | POA: Diagnosis not present

## 2019-09-21 DIAGNOSIS — Z992 Dependence on renal dialysis: Secondary | ICD-10-CM | POA: Diagnosis not present

## 2019-09-21 DIAGNOSIS — N186 End stage renal disease: Secondary | ICD-10-CM | POA: Diagnosis not present

## 2019-09-21 DIAGNOSIS — N2581 Secondary hyperparathyroidism of renal origin: Secondary | ICD-10-CM | POA: Diagnosis not present

## 2019-09-23 DIAGNOSIS — D509 Iron deficiency anemia, unspecified: Secondary | ICD-10-CM | POA: Diagnosis not present

## 2019-09-23 DIAGNOSIS — Z992 Dependence on renal dialysis: Secondary | ICD-10-CM | POA: Diagnosis not present

## 2019-09-23 DIAGNOSIS — E559 Vitamin D deficiency, unspecified: Secondary | ICD-10-CM | POA: Diagnosis not present

## 2019-09-23 DIAGNOSIS — N186 End stage renal disease: Secondary | ICD-10-CM | POA: Diagnosis not present

## 2019-09-23 DIAGNOSIS — N2581 Secondary hyperparathyroidism of renal origin: Secondary | ICD-10-CM | POA: Diagnosis not present

## 2019-09-26 DIAGNOSIS — E559 Vitamin D deficiency, unspecified: Secondary | ICD-10-CM | POA: Diagnosis not present

## 2019-09-26 DIAGNOSIS — Z992 Dependence on renal dialysis: Secondary | ICD-10-CM | POA: Diagnosis not present

## 2019-09-26 DIAGNOSIS — D509 Iron deficiency anemia, unspecified: Secondary | ICD-10-CM | POA: Diagnosis not present

## 2019-09-26 DIAGNOSIS — N2581 Secondary hyperparathyroidism of renal origin: Secondary | ICD-10-CM | POA: Diagnosis not present

## 2019-09-26 DIAGNOSIS — N186 End stage renal disease: Secondary | ICD-10-CM | POA: Diagnosis not present

## 2019-09-26 DIAGNOSIS — E119 Type 2 diabetes mellitus without complications: Secondary | ICD-10-CM | POA: Diagnosis not present

## 2019-09-28 DIAGNOSIS — D509 Iron deficiency anemia, unspecified: Secondary | ICD-10-CM | POA: Diagnosis not present

## 2019-09-28 DIAGNOSIS — N186 End stage renal disease: Secondary | ICD-10-CM | POA: Diagnosis not present

## 2019-09-28 DIAGNOSIS — E559 Vitamin D deficiency, unspecified: Secondary | ICD-10-CM | POA: Diagnosis not present

## 2019-09-28 DIAGNOSIS — Z992 Dependence on renal dialysis: Secondary | ICD-10-CM | POA: Diagnosis not present

## 2019-09-28 DIAGNOSIS — N2581 Secondary hyperparathyroidism of renal origin: Secondary | ICD-10-CM | POA: Diagnosis not present

## 2019-09-30 DIAGNOSIS — N186 End stage renal disease: Secondary | ICD-10-CM | POA: Diagnosis not present

## 2019-09-30 DIAGNOSIS — N2581 Secondary hyperparathyroidism of renal origin: Secondary | ICD-10-CM | POA: Diagnosis not present

## 2019-09-30 DIAGNOSIS — D509 Iron deficiency anemia, unspecified: Secondary | ICD-10-CM | POA: Diagnosis not present

## 2019-09-30 DIAGNOSIS — Z992 Dependence on renal dialysis: Secondary | ICD-10-CM | POA: Diagnosis not present

## 2019-09-30 DIAGNOSIS — E559 Vitamin D deficiency, unspecified: Secondary | ICD-10-CM | POA: Diagnosis not present

## 2019-10-03 DIAGNOSIS — E559 Vitamin D deficiency, unspecified: Secondary | ICD-10-CM | POA: Diagnosis not present

## 2019-10-03 DIAGNOSIS — N186 End stage renal disease: Secondary | ICD-10-CM | POA: Diagnosis not present

## 2019-10-03 DIAGNOSIS — N2581 Secondary hyperparathyroidism of renal origin: Secondary | ICD-10-CM | POA: Diagnosis not present

## 2019-10-03 DIAGNOSIS — D509 Iron deficiency anemia, unspecified: Secondary | ICD-10-CM | POA: Diagnosis not present

## 2019-10-03 DIAGNOSIS — Z992 Dependence on renal dialysis: Secondary | ICD-10-CM | POA: Diagnosis not present

## 2019-10-04 DIAGNOSIS — Z0130 Encounter for examination of blood pressure without abnormal findings: Secondary | ICD-10-CM | POA: Diagnosis not present

## 2019-10-05 DIAGNOSIS — N186 End stage renal disease: Secondary | ICD-10-CM | POA: Diagnosis not present

## 2019-10-05 DIAGNOSIS — E559 Vitamin D deficiency, unspecified: Secondary | ICD-10-CM | POA: Diagnosis not present

## 2019-10-05 DIAGNOSIS — Z992 Dependence on renal dialysis: Secondary | ICD-10-CM | POA: Diagnosis not present

## 2019-10-05 DIAGNOSIS — D509 Iron deficiency anemia, unspecified: Secondary | ICD-10-CM | POA: Diagnosis not present

## 2019-10-05 DIAGNOSIS — N2581 Secondary hyperparathyroidism of renal origin: Secondary | ICD-10-CM | POA: Diagnosis not present

## 2019-10-06 DIAGNOSIS — F329 Major depressive disorder, single episode, unspecified: Secondary | ICD-10-CM | POA: Diagnosis not present

## 2019-10-06 DIAGNOSIS — F419 Anxiety disorder, unspecified: Secondary | ICD-10-CM | POA: Diagnosis not present

## 2019-10-07 DIAGNOSIS — N2581 Secondary hyperparathyroidism of renal origin: Secondary | ICD-10-CM | POA: Diagnosis not present

## 2019-10-07 DIAGNOSIS — Z992 Dependence on renal dialysis: Secondary | ICD-10-CM | POA: Diagnosis not present

## 2019-10-07 DIAGNOSIS — N186 End stage renal disease: Secondary | ICD-10-CM | POA: Diagnosis not present

## 2019-10-07 DIAGNOSIS — E559 Vitamin D deficiency, unspecified: Secondary | ICD-10-CM | POA: Diagnosis not present

## 2019-10-07 DIAGNOSIS — D509 Iron deficiency anemia, unspecified: Secondary | ICD-10-CM | POA: Diagnosis not present

## 2019-10-10 DIAGNOSIS — D509 Iron deficiency anemia, unspecified: Secondary | ICD-10-CM | POA: Diagnosis not present

## 2019-10-10 DIAGNOSIS — N186 End stage renal disease: Secondary | ICD-10-CM | POA: Diagnosis not present

## 2019-10-10 DIAGNOSIS — N2581 Secondary hyperparathyroidism of renal origin: Secondary | ICD-10-CM | POA: Diagnosis not present

## 2019-10-10 DIAGNOSIS — Z992 Dependence on renal dialysis: Secondary | ICD-10-CM | POA: Diagnosis not present

## 2019-10-10 DIAGNOSIS — E559 Vitamin D deficiency, unspecified: Secondary | ICD-10-CM | POA: Diagnosis not present

## 2019-10-12 DIAGNOSIS — N186 End stage renal disease: Secondary | ICD-10-CM | POA: Diagnosis not present

## 2019-10-12 DIAGNOSIS — Z992 Dependence on renal dialysis: Secondary | ICD-10-CM | POA: Diagnosis not present

## 2019-10-12 DIAGNOSIS — N2581 Secondary hyperparathyroidism of renal origin: Secondary | ICD-10-CM | POA: Diagnosis not present

## 2019-10-12 DIAGNOSIS — D509 Iron deficiency anemia, unspecified: Secondary | ICD-10-CM | POA: Diagnosis not present

## 2019-10-12 DIAGNOSIS — E559 Vitamin D deficiency, unspecified: Secondary | ICD-10-CM | POA: Diagnosis not present

## 2019-10-14 DIAGNOSIS — D509 Iron deficiency anemia, unspecified: Secondary | ICD-10-CM | POA: Diagnosis not present

## 2019-10-14 DIAGNOSIS — Z992 Dependence on renal dialysis: Secondary | ICD-10-CM | POA: Diagnosis not present

## 2019-10-14 DIAGNOSIS — N2581 Secondary hyperparathyroidism of renal origin: Secondary | ICD-10-CM | POA: Diagnosis not present

## 2019-10-14 DIAGNOSIS — E559 Vitamin D deficiency, unspecified: Secondary | ICD-10-CM | POA: Diagnosis not present

## 2019-10-14 DIAGNOSIS — N186 End stage renal disease: Secondary | ICD-10-CM | POA: Diagnosis not present

## 2019-10-15 DIAGNOSIS — N186 End stage renal disease: Secondary | ICD-10-CM | POA: Diagnosis not present

## 2019-10-15 DIAGNOSIS — Z992 Dependence on renal dialysis: Secondary | ICD-10-CM | POA: Diagnosis not present

## 2019-10-17 DIAGNOSIS — E559 Vitamin D deficiency, unspecified: Secondary | ICD-10-CM | POA: Diagnosis not present

## 2019-10-17 DIAGNOSIS — D509 Iron deficiency anemia, unspecified: Secondary | ICD-10-CM | POA: Diagnosis not present

## 2019-10-17 DIAGNOSIS — Z992 Dependence on renal dialysis: Secondary | ICD-10-CM | POA: Diagnosis not present

## 2019-10-17 DIAGNOSIS — N186 End stage renal disease: Secondary | ICD-10-CM | POA: Diagnosis not present

## 2019-10-17 DIAGNOSIS — N2581 Secondary hyperparathyroidism of renal origin: Secondary | ICD-10-CM | POA: Diagnosis not present

## 2019-10-19 DIAGNOSIS — N2581 Secondary hyperparathyroidism of renal origin: Secondary | ICD-10-CM | POA: Diagnosis not present

## 2019-10-19 DIAGNOSIS — Z992 Dependence on renal dialysis: Secondary | ICD-10-CM | POA: Diagnosis not present

## 2019-10-19 DIAGNOSIS — D509 Iron deficiency anemia, unspecified: Secondary | ICD-10-CM | POA: Diagnosis not present

## 2019-10-19 DIAGNOSIS — E559 Vitamin D deficiency, unspecified: Secondary | ICD-10-CM | POA: Diagnosis not present

## 2019-10-19 DIAGNOSIS — N186 End stage renal disease: Secondary | ICD-10-CM | POA: Diagnosis not present

## 2019-10-21 DIAGNOSIS — E559 Vitamin D deficiency, unspecified: Secondary | ICD-10-CM | POA: Diagnosis not present

## 2019-10-21 DIAGNOSIS — D509 Iron deficiency anemia, unspecified: Secondary | ICD-10-CM | POA: Diagnosis not present

## 2019-10-21 DIAGNOSIS — N2581 Secondary hyperparathyroidism of renal origin: Secondary | ICD-10-CM | POA: Diagnosis not present

## 2019-10-21 DIAGNOSIS — N186 End stage renal disease: Secondary | ICD-10-CM | POA: Diagnosis not present

## 2019-10-21 DIAGNOSIS — Z992 Dependence on renal dialysis: Secondary | ICD-10-CM | POA: Diagnosis not present

## 2019-10-24 DIAGNOSIS — Z992 Dependence on renal dialysis: Secondary | ICD-10-CM | POA: Diagnosis not present

## 2019-10-24 DIAGNOSIS — N186 End stage renal disease: Secondary | ICD-10-CM | POA: Diagnosis not present

## 2019-10-24 DIAGNOSIS — D509 Iron deficiency anemia, unspecified: Secondary | ICD-10-CM | POA: Diagnosis not present

## 2019-10-24 DIAGNOSIS — N2581 Secondary hyperparathyroidism of renal origin: Secondary | ICD-10-CM | POA: Diagnosis not present

## 2019-10-24 DIAGNOSIS — E559 Vitamin D deficiency, unspecified: Secondary | ICD-10-CM | POA: Diagnosis not present

## 2019-10-26 DIAGNOSIS — D509 Iron deficiency anemia, unspecified: Secondary | ICD-10-CM | POA: Diagnosis not present

## 2019-10-26 DIAGNOSIS — Z992 Dependence on renal dialysis: Secondary | ICD-10-CM | POA: Diagnosis not present

## 2019-10-26 DIAGNOSIS — N186 End stage renal disease: Secondary | ICD-10-CM | POA: Diagnosis not present

## 2019-10-26 DIAGNOSIS — N2581 Secondary hyperparathyroidism of renal origin: Secondary | ICD-10-CM | POA: Diagnosis not present

## 2019-10-26 DIAGNOSIS — E559 Vitamin D deficiency, unspecified: Secondary | ICD-10-CM | POA: Diagnosis not present

## 2019-10-28 DIAGNOSIS — D509 Iron deficiency anemia, unspecified: Secondary | ICD-10-CM | POA: Diagnosis not present

## 2019-10-28 DIAGNOSIS — N2581 Secondary hyperparathyroidism of renal origin: Secondary | ICD-10-CM | POA: Diagnosis not present

## 2019-10-28 DIAGNOSIS — N186 End stage renal disease: Secondary | ICD-10-CM | POA: Diagnosis not present

## 2019-10-28 DIAGNOSIS — Z992 Dependence on renal dialysis: Secondary | ICD-10-CM | POA: Diagnosis not present

## 2019-10-28 DIAGNOSIS — E559 Vitamin D deficiency, unspecified: Secondary | ICD-10-CM | POA: Diagnosis not present

## 2019-10-31 DIAGNOSIS — D509 Iron deficiency anemia, unspecified: Secondary | ICD-10-CM | POA: Diagnosis not present

## 2019-10-31 DIAGNOSIS — N186 End stage renal disease: Secondary | ICD-10-CM | POA: Diagnosis not present

## 2019-10-31 DIAGNOSIS — Z992 Dependence on renal dialysis: Secondary | ICD-10-CM | POA: Diagnosis not present

## 2019-10-31 DIAGNOSIS — N2581 Secondary hyperparathyroidism of renal origin: Secondary | ICD-10-CM | POA: Diagnosis not present

## 2019-10-31 DIAGNOSIS — E559 Vitamin D deficiency, unspecified: Secondary | ICD-10-CM | POA: Diagnosis not present

## 2019-11-02 DIAGNOSIS — N186 End stage renal disease: Secondary | ICD-10-CM | POA: Diagnosis not present

## 2019-11-02 DIAGNOSIS — D509 Iron deficiency anemia, unspecified: Secondary | ICD-10-CM | POA: Diagnosis not present

## 2019-11-02 DIAGNOSIS — N2581 Secondary hyperparathyroidism of renal origin: Secondary | ICD-10-CM | POA: Diagnosis not present

## 2019-11-02 DIAGNOSIS — Z992 Dependence on renal dialysis: Secondary | ICD-10-CM | POA: Diagnosis not present

## 2019-11-02 DIAGNOSIS — E559 Vitamin D deficiency, unspecified: Secondary | ICD-10-CM | POA: Diagnosis not present

## 2019-11-03 DIAGNOSIS — F329 Major depressive disorder, single episode, unspecified: Secondary | ICD-10-CM | POA: Diagnosis not present

## 2019-11-03 DIAGNOSIS — F419 Anxiety disorder, unspecified: Secondary | ICD-10-CM | POA: Diagnosis not present

## 2019-11-04 DIAGNOSIS — Z992 Dependence on renal dialysis: Secondary | ICD-10-CM | POA: Diagnosis not present

## 2019-11-04 DIAGNOSIS — N2581 Secondary hyperparathyroidism of renal origin: Secondary | ICD-10-CM | POA: Diagnosis not present

## 2019-11-04 DIAGNOSIS — D509 Iron deficiency anemia, unspecified: Secondary | ICD-10-CM | POA: Diagnosis not present

## 2019-11-04 DIAGNOSIS — N186 End stage renal disease: Secondary | ICD-10-CM | POA: Diagnosis not present

## 2019-11-04 DIAGNOSIS — E559 Vitamin D deficiency, unspecified: Secondary | ICD-10-CM | POA: Diagnosis not present

## 2019-11-07 DIAGNOSIS — E559 Vitamin D deficiency, unspecified: Secondary | ICD-10-CM | POA: Diagnosis not present

## 2019-11-07 DIAGNOSIS — N186 End stage renal disease: Secondary | ICD-10-CM | POA: Diagnosis not present

## 2019-11-07 DIAGNOSIS — Z992 Dependence on renal dialysis: Secondary | ICD-10-CM | POA: Diagnosis not present

## 2019-11-07 DIAGNOSIS — D509 Iron deficiency anemia, unspecified: Secondary | ICD-10-CM | POA: Diagnosis not present

## 2019-11-07 DIAGNOSIS — N2581 Secondary hyperparathyroidism of renal origin: Secondary | ICD-10-CM | POA: Diagnosis not present

## 2019-11-09 DIAGNOSIS — E559 Vitamin D deficiency, unspecified: Secondary | ICD-10-CM | POA: Diagnosis not present

## 2019-11-09 DIAGNOSIS — N186 End stage renal disease: Secondary | ICD-10-CM | POA: Diagnosis not present

## 2019-11-09 DIAGNOSIS — D509 Iron deficiency anemia, unspecified: Secondary | ICD-10-CM | POA: Diagnosis not present

## 2019-11-09 DIAGNOSIS — Z992 Dependence on renal dialysis: Secondary | ICD-10-CM | POA: Diagnosis not present

## 2019-11-09 DIAGNOSIS — N2581 Secondary hyperparathyroidism of renal origin: Secondary | ICD-10-CM | POA: Diagnosis not present

## 2019-11-11 DIAGNOSIS — Z992 Dependence on renal dialysis: Secondary | ICD-10-CM | POA: Diagnosis not present

## 2019-11-11 DIAGNOSIS — E559 Vitamin D deficiency, unspecified: Secondary | ICD-10-CM | POA: Diagnosis not present

## 2019-11-11 DIAGNOSIS — D509 Iron deficiency anemia, unspecified: Secondary | ICD-10-CM | POA: Diagnosis not present

## 2019-11-11 DIAGNOSIS — N2581 Secondary hyperparathyroidism of renal origin: Secondary | ICD-10-CM | POA: Diagnosis not present

## 2019-11-11 DIAGNOSIS — N186 End stage renal disease: Secondary | ICD-10-CM | POA: Diagnosis not present

## 2019-11-14 DIAGNOSIS — D509 Iron deficiency anemia, unspecified: Secondary | ICD-10-CM | POA: Diagnosis not present

## 2019-11-14 DIAGNOSIS — Z992 Dependence on renal dialysis: Secondary | ICD-10-CM | POA: Diagnosis not present

## 2019-11-14 DIAGNOSIS — N2581 Secondary hyperparathyroidism of renal origin: Secondary | ICD-10-CM | POA: Diagnosis not present

## 2019-11-14 DIAGNOSIS — E559 Vitamin D deficiency, unspecified: Secondary | ICD-10-CM | POA: Diagnosis not present

## 2019-11-14 DIAGNOSIS — N186 End stage renal disease: Secondary | ICD-10-CM | POA: Diagnosis not present

## 2019-11-15 DIAGNOSIS — Z992 Dependence on renal dialysis: Secondary | ICD-10-CM | POA: Diagnosis not present

## 2019-11-15 DIAGNOSIS — N186 End stage renal disease: Secondary | ICD-10-CM | POA: Diagnosis not present

## 2019-11-16 DIAGNOSIS — D631 Anemia in chronic kidney disease: Secondary | ICD-10-CM | POA: Diagnosis not present

## 2019-11-16 DIAGNOSIS — N2581 Secondary hyperparathyroidism of renal origin: Secondary | ICD-10-CM | POA: Diagnosis not present

## 2019-11-16 DIAGNOSIS — Z992 Dependence on renal dialysis: Secondary | ICD-10-CM | POA: Diagnosis not present

## 2019-11-16 DIAGNOSIS — E559 Vitamin D deficiency, unspecified: Secondary | ICD-10-CM | POA: Diagnosis not present

## 2019-11-16 DIAGNOSIS — N186 End stage renal disease: Secondary | ICD-10-CM | POA: Diagnosis not present

## 2019-11-16 DIAGNOSIS — D509 Iron deficiency anemia, unspecified: Secondary | ICD-10-CM | POA: Diagnosis not present

## 2019-11-18 DIAGNOSIS — N186 End stage renal disease: Secondary | ICD-10-CM | POA: Diagnosis not present

## 2019-11-18 DIAGNOSIS — E559 Vitamin D deficiency, unspecified: Secondary | ICD-10-CM | POA: Diagnosis not present

## 2019-11-18 DIAGNOSIS — N2581 Secondary hyperparathyroidism of renal origin: Secondary | ICD-10-CM | POA: Diagnosis not present

## 2019-11-18 DIAGNOSIS — D631 Anemia in chronic kidney disease: Secondary | ICD-10-CM | POA: Diagnosis not present

## 2019-11-18 DIAGNOSIS — Z992 Dependence on renal dialysis: Secondary | ICD-10-CM | POA: Diagnosis not present

## 2019-11-18 DIAGNOSIS — D509 Iron deficiency anemia, unspecified: Secondary | ICD-10-CM | POA: Diagnosis not present

## 2019-11-21 DIAGNOSIS — D509 Iron deficiency anemia, unspecified: Secondary | ICD-10-CM | POA: Diagnosis not present

## 2019-11-21 DIAGNOSIS — N186 End stage renal disease: Secondary | ICD-10-CM | POA: Diagnosis not present

## 2019-11-21 DIAGNOSIS — Z992 Dependence on renal dialysis: Secondary | ICD-10-CM | POA: Diagnosis not present

## 2019-11-21 DIAGNOSIS — N2581 Secondary hyperparathyroidism of renal origin: Secondary | ICD-10-CM | POA: Diagnosis not present

## 2019-11-21 DIAGNOSIS — E559 Vitamin D deficiency, unspecified: Secondary | ICD-10-CM | POA: Diagnosis not present

## 2019-11-21 DIAGNOSIS — D631 Anemia in chronic kidney disease: Secondary | ICD-10-CM | POA: Diagnosis not present

## 2019-11-23 DIAGNOSIS — N186 End stage renal disease: Secondary | ICD-10-CM | POA: Diagnosis not present

## 2019-11-23 DIAGNOSIS — N2581 Secondary hyperparathyroidism of renal origin: Secondary | ICD-10-CM | POA: Diagnosis not present

## 2019-11-23 DIAGNOSIS — E559 Vitamin D deficiency, unspecified: Secondary | ICD-10-CM | POA: Diagnosis not present

## 2019-11-23 DIAGNOSIS — Z992 Dependence on renal dialysis: Secondary | ICD-10-CM | POA: Diagnosis not present

## 2019-11-23 DIAGNOSIS — D631 Anemia in chronic kidney disease: Secondary | ICD-10-CM | POA: Diagnosis not present

## 2019-11-23 DIAGNOSIS — D509 Iron deficiency anemia, unspecified: Secondary | ICD-10-CM | POA: Diagnosis not present

## 2019-11-25 DIAGNOSIS — N186 End stage renal disease: Secondary | ICD-10-CM | POA: Diagnosis not present

## 2019-11-25 DIAGNOSIS — D631 Anemia in chronic kidney disease: Secondary | ICD-10-CM | POA: Diagnosis not present

## 2019-11-25 DIAGNOSIS — Z992 Dependence on renal dialysis: Secondary | ICD-10-CM | POA: Diagnosis not present

## 2019-11-25 DIAGNOSIS — E559 Vitamin D deficiency, unspecified: Secondary | ICD-10-CM | POA: Diagnosis not present

## 2019-11-25 DIAGNOSIS — N2581 Secondary hyperparathyroidism of renal origin: Secondary | ICD-10-CM | POA: Diagnosis not present

## 2019-11-25 DIAGNOSIS — D509 Iron deficiency anemia, unspecified: Secondary | ICD-10-CM | POA: Diagnosis not present

## 2019-11-28 DIAGNOSIS — D509 Iron deficiency anemia, unspecified: Secondary | ICD-10-CM | POA: Diagnosis not present

## 2019-11-28 DIAGNOSIS — N186 End stage renal disease: Secondary | ICD-10-CM | POA: Diagnosis not present

## 2019-11-28 DIAGNOSIS — Z992 Dependence on renal dialysis: Secondary | ICD-10-CM | POA: Diagnosis not present

## 2019-11-28 DIAGNOSIS — D631 Anemia in chronic kidney disease: Secondary | ICD-10-CM | POA: Diagnosis not present

## 2019-11-28 DIAGNOSIS — E559 Vitamin D deficiency, unspecified: Secondary | ICD-10-CM | POA: Diagnosis not present

## 2019-11-28 DIAGNOSIS — N2581 Secondary hyperparathyroidism of renal origin: Secondary | ICD-10-CM | POA: Diagnosis not present

## 2019-11-30 DIAGNOSIS — Z992 Dependence on renal dialysis: Secondary | ICD-10-CM | POA: Diagnosis not present

## 2019-11-30 DIAGNOSIS — D631 Anemia in chronic kidney disease: Secondary | ICD-10-CM | POA: Diagnosis not present

## 2019-11-30 DIAGNOSIS — N186 End stage renal disease: Secondary | ICD-10-CM | POA: Diagnosis not present

## 2019-11-30 DIAGNOSIS — N2581 Secondary hyperparathyroidism of renal origin: Secondary | ICD-10-CM | POA: Diagnosis not present

## 2019-11-30 DIAGNOSIS — E559 Vitamin D deficiency, unspecified: Secondary | ICD-10-CM | POA: Diagnosis not present

## 2019-11-30 DIAGNOSIS — D509 Iron deficiency anemia, unspecified: Secondary | ICD-10-CM | POA: Diagnosis not present

## 2019-12-02 DIAGNOSIS — N2581 Secondary hyperparathyroidism of renal origin: Secondary | ICD-10-CM | POA: Diagnosis not present

## 2019-12-02 DIAGNOSIS — E559 Vitamin D deficiency, unspecified: Secondary | ICD-10-CM | POA: Diagnosis not present

## 2019-12-02 DIAGNOSIS — D509 Iron deficiency anemia, unspecified: Secondary | ICD-10-CM | POA: Diagnosis not present

## 2019-12-02 DIAGNOSIS — N186 End stage renal disease: Secondary | ICD-10-CM | POA: Diagnosis not present

## 2019-12-02 DIAGNOSIS — Z992 Dependence on renal dialysis: Secondary | ICD-10-CM | POA: Diagnosis not present

## 2019-12-02 DIAGNOSIS — D631 Anemia in chronic kidney disease: Secondary | ICD-10-CM | POA: Diagnosis not present

## 2019-12-05 DIAGNOSIS — D509 Iron deficiency anemia, unspecified: Secondary | ICD-10-CM | POA: Diagnosis not present

## 2019-12-05 DIAGNOSIS — N186 End stage renal disease: Secondary | ICD-10-CM | POA: Diagnosis not present

## 2019-12-05 DIAGNOSIS — D631 Anemia in chronic kidney disease: Secondary | ICD-10-CM | POA: Diagnosis not present

## 2019-12-05 DIAGNOSIS — Z992 Dependence on renal dialysis: Secondary | ICD-10-CM | POA: Diagnosis not present

## 2019-12-05 DIAGNOSIS — E559 Vitamin D deficiency, unspecified: Secondary | ICD-10-CM | POA: Diagnosis not present

## 2019-12-05 DIAGNOSIS — N2581 Secondary hyperparathyroidism of renal origin: Secondary | ICD-10-CM | POA: Diagnosis not present

## 2019-12-07 DIAGNOSIS — N186 End stage renal disease: Secondary | ICD-10-CM | POA: Diagnosis not present

## 2019-12-07 DIAGNOSIS — Z992 Dependence on renal dialysis: Secondary | ICD-10-CM | POA: Diagnosis not present

## 2019-12-07 DIAGNOSIS — D509 Iron deficiency anemia, unspecified: Secondary | ICD-10-CM | POA: Diagnosis not present

## 2019-12-07 DIAGNOSIS — E559 Vitamin D deficiency, unspecified: Secondary | ICD-10-CM | POA: Diagnosis not present

## 2019-12-07 DIAGNOSIS — D631 Anemia in chronic kidney disease: Secondary | ICD-10-CM | POA: Diagnosis not present

## 2019-12-07 DIAGNOSIS — N2581 Secondary hyperparathyroidism of renal origin: Secondary | ICD-10-CM | POA: Diagnosis not present

## 2019-12-08 DIAGNOSIS — F419 Anxiety disorder, unspecified: Secondary | ICD-10-CM | POA: Diagnosis not present

## 2019-12-08 DIAGNOSIS — F329 Major depressive disorder, single episode, unspecified: Secondary | ICD-10-CM | POA: Diagnosis not present

## 2019-12-08 DIAGNOSIS — Z79899 Other long term (current) drug therapy: Secondary | ICD-10-CM | POA: Diagnosis not present

## 2019-12-09 DIAGNOSIS — N186 End stage renal disease: Secondary | ICD-10-CM | POA: Diagnosis not present

## 2019-12-09 DIAGNOSIS — Z992 Dependence on renal dialysis: Secondary | ICD-10-CM | POA: Diagnosis not present

## 2019-12-09 DIAGNOSIS — D631 Anemia in chronic kidney disease: Secondary | ICD-10-CM | POA: Diagnosis not present

## 2019-12-09 DIAGNOSIS — E559 Vitamin D deficiency, unspecified: Secondary | ICD-10-CM | POA: Diagnosis not present

## 2019-12-09 DIAGNOSIS — N2581 Secondary hyperparathyroidism of renal origin: Secondary | ICD-10-CM | POA: Diagnosis not present

## 2019-12-09 DIAGNOSIS — D509 Iron deficiency anemia, unspecified: Secondary | ICD-10-CM | POA: Diagnosis not present

## 2019-12-12 DIAGNOSIS — N2581 Secondary hyperparathyroidism of renal origin: Secondary | ICD-10-CM | POA: Diagnosis not present

## 2019-12-12 DIAGNOSIS — D631 Anemia in chronic kidney disease: Secondary | ICD-10-CM | POA: Diagnosis not present

## 2019-12-12 DIAGNOSIS — D509 Iron deficiency anemia, unspecified: Secondary | ICD-10-CM | POA: Diagnosis not present

## 2019-12-12 DIAGNOSIS — E559 Vitamin D deficiency, unspecified: Secondary | ICD-10-CM | POA: Diagnosis not present

## 2019-12-12 DIAGNOSIS — N186 End stage renal disease: Secondary | ICD-10-CM | POA: Diagnosis not present

## 2019-12-12 DIAGNOSIS — Z992 Dependence on renal dialysis: Secondary | ICD-10-CM | POA: Diagnosis not present

## 2019-12-14 DIAGNOSIS — N186 End stage renal disease: Secondary | ICD-10-CM | POA: Diagnosis not present

## 2019-12-14 DIAGNOSIS — D509 Iron deficiency anemia, unspecified: Secondary | ICD-10-CM | POA: Diagnosis not present

## 2019-12-14 DIAGNOSIS — Z992 Dependence on renal dialysis: Secondary | ICD-10-CM | POA: Diagnosis not present

## 2019-12-14 DIAGNOSIS — D631 Anemia in chronic kidney disease: Secondary | ICD-10-CM | POA: Diagnosis not present

## 2019-12-14 DIAGNOSIS — E559 Vitamin D deficiency, unspecified: Secondary | ICD-10-CM | POA: Diagnosis not present

## 2019-12-14 DIAGNOSIS — N2581 Secondary hyperparathyroidism of renal origin: Secondary | ICD-10-CM | POA: Diagnosis not present

## 2019-12-15 DIAGNOSIS — Z992 Dependence on renal dialysis: Secondary | ICD-10-CM | POA: Diagnosis not present

## 2019-12-15 DIAGNOSIS — N186 End stage renal disease: Secondary | ICD-10-CM | POA: Diagnosis not present

## 2019-12-16 DIAGNOSIS — Z23 Encounter for immunization: Secondary | ICD-10-CM | POA: Diagnosis not present

## 2019-12-16 DIAGNOSIS — Z992 Dependence on renal dialysis: Secondary | ICD-10-CM | POA: Diagnosis not present

## 2019-12-16 DIAGNOSIS — N2581 Secondary hyperparathyroidism of renal origin: Secondary | ICD-10-CM | POA: Diagnosis not present

## 2019-12-16 DIAGNOSIS — D631 Anemia in chronic kidney disease: Secondary | ICD-10-CM | POA: Diagnosis not present

## 2019-12-16 DIAGNOSIS — N186 End stage renal disease: Secondary | ICD-10-CM | POA: Diagnosis not present

## 2019-12-16 DIAGNOSIS — D509 Iron deficiency anemia, unspecified: Secondary | ICD-10-CM | POA: Diagnosis not present

## 2019-12-16 DIAGNOSIS — E559 Vitamin D deficiency, unspecified: Secondary | ICD-10-CM | POA: Diagnosis not present

## 2019-12-19 DIAGNOSIS — N186 End stage renal disease: Secondary | ICD-10-CM | POA: Diagnosis not present

## 2019-12-19 DIAGNOSIS — Z23 Encounter for immunization: Secondary | ICD-10-CM | POA: Diagnosis not present

## 2019-12-19 DIAGNOSIS — D509 Iron deficiency anemia, unspecified: Secondary | ICD-10-CM | POA: Diagnosis not present

## 2019-12-19 DIAGNOSIS — Z992 Dependence on renal dialysis: Secondary | ICD-10-CM | POA: Diagnosis not present

## 2019-12-19 DIAGNOSIS — N2581 Secondary hyperparathyroidism of renal origin: Secondary | ICD-10-CM | POA: Diagnosis not present

## 2019-12-19 DIAGNOSIS — E559 Vitamin D deficiency, unspecified: Secondary | ICD-10-CM | POA: Diagnosis not present

## 2019-12-21 DIAGNOSIS — Z23 Encounter for immunization: Secondary | ICD-10-CM | POA: Diagnosis not present

## 2019-12-21 DIAGNOSIS — N186 End stage renal disease: Secondary | ICD-10-CM | POA: Diagnosis not present

## 2019-12-21 DIAGNOSIS — N2581 Secondary hyperparathyroidism of renal origin: Secondary | ICD-10-CM | POA: Diagnosis not present

## 2019-12-21 DIAGNOSIS — E559 Vitamin D deficiency, unspecified: Secondary | ICD-10-CM | POA: Diagnosis not present

## 2019-12-21 DIAGNOSIS — D509 Iron deficiency anemia, unspecified: Secondary | ICD-10-CM | POA: Diagnosis not present

## 2019-12-21 DIAGNOSIS — Z992 Dependence on renal dialysis: Secondary | ICD-10-CM | POA: Diagnosis not present

## 2019-12-23 DIAGNOSIS — Z23 Encounter for immunization: Secondary | ICD-10-CM | POA: Diagnosis not present

## 2019-12-23 DIAGNOSIS — N186 End stage renal disease: Secondary | ICD-10-CM | POA: Diagnosis not present

## 2019-12-23 DIAGNOSIS — Z992 Dependence on renal dialysis: Secondary | ICD-10-CM | POA: Diagnosis not present

## 2019-12-23 DIAGNOSIS — D509 Iron deficiency anemia, unspecified: Secondary | ICD-10-CM | POA: Diagnosis not present

## 2019-12-23 DIAGNOSIS — E559 Vitamin D deficiency, unspecified: Secondary | ICD-10-CM | POA: Diagnosis not present

## 2019-12-23 DIAGNOSIS — N2581 Secondary hyperparathyroidism of renal origin: Secondary | ICD-10-CM | POA: Diagnosis not present

## 2019-12-26 DIAGNOSIS — Z23 Encounter for immunization: Secondary | ICD-10-CM | POA: Diagnosis not present

## 2019-12-26 DIAGNOSIS — E559 Vitamin D deficiency, unspecified: Secondary | ICD-10-CM | POA: Diagnosis not present

## 2019-12-26 DIAGNOSIS — N186 End stage renal disease: Secondary | ICD-10-CM | POA: Diagnosis not present

## 2019-12-26 DIAGNOSIS — N2581 Secondary hyperparathyroidism of renal origin: Secondary | ICD-10-CM | POA: Diagnosis not present

## 2019-12-26 DIAGNOSIS — D509 Iron deficiency anemia, unspecified: Secondary | ICD-10-CM | POA: Diagnosis not present

## 2019-12-26 DIAGNOSIS — E119 Type 2 diabetes mellitus without complications: Secondary | ICD-10-CM | POA: Diagnosis not present

## 2019-12-26 DIAGNOSIS — Z992 Dependence on renal dialysis: Secondary | ICD-10-CM | POA: Diagnosis not present

## 2019-12-28 DIAGNOSIS — Z23 Encounter for immunization: Secondary | ICD-10-CM | POA: Diagnosis not present

## 2019-12-28 DIAGNOSIS — N2581 Secondary hyperparathyroidism of renal origin: Secondary | ICD-10-CM | POA: Diagnosis not present

## 2019-12-28 DIAGNOSIS — Z992 Dependence on renal dialysis: Secondary | ICD-10-CM | POA: Diagnosis not present

## 2019-12-28 DIAGNOSIS — N186 End stage renal disease: Secondary | ICD-10-CM | POA: Diagnosis not present

## 2019-12-28 DIAGNOSIS — D509 Iron deficiency anemia, unspecified: Secondary | ICD-10-CM | POA: Diagnosis not present

## 2019-12-28 DIAGNOSIS — E559 Vitamin D deficiency, unspecified: Secondary | ICD-10-CM | POA: Diagnosis not present

## 2019-12-30 DIAGNOSIS — N2581 Secondary hyperparathyroidism of renal origin: Secondary | ICD-10-CM | POA: Diagnosis not present

## 2019-12-30 DIAGNOSIS — Z23 Encounter for immunization: Secondary | ICD-10-CM | POA: Diagnosis not present

## 2019-12-30 DIAGNOSIS — N186 End stage renal disease: Secondary | ICD-10-CM | POA: Diagnosis not present

## 2019-12-30 DIAGNOSIS — D509 Iron deficiency anemia, unspecified: Secondary | ICD-10-CM | POA: Diagnosis not present

## 2019-12-30 DIAGNOSIS — E559 Vitamin D deficiency, unspecified: Secondary | ICD-10-CM | POA: Diagnosis not present

## 2019-12-30 DIAGNOSIS — Z992 Dependence on renal dialysis: Secondary | ICD-10-CM | POA: Diagnosis not present

## 2020-01-02 ENCOUNTER — Other Ambulatory Visit (INDEPENDENT_AMBULATORY_CARE_PROVIDER_SITE_OTHER): Payer: Self-pay | Admitting: Nurse Practitioner

## 2020-01-02 ENCOUNTER — Other Ambulatory Visit (INDEPENDENT_AMBULATORY_CARE_PROVIDER_SITE_OTHER): Payer: Self-pay | Admitting: Vascular Surgery

## 2020-01-02 DIAGNOSIS — N186 End stage renal disease: Secondary | ICD-10-CM | POA: Diagnosis not present

## 2020-01-02 DIAGNOSIS — E559 Vitamin D deficiency, unspecified: Secondary | ICD-10-CM | POA: Diagnosis not present

## 2020-01-02 DIAGNOSIS — N2581 Secondary hyperparathyroidism of renal origin: Secondary | ICD-10-CM | POA: Diagnosis not present

## 2020-01-02 DIAGNOSIS — Z992 Dependence on renal dialysis: Secondary | ICD-10-CM | POA: Diagnosis not present

## 2020-01-02 DIAGNOSIS — Z23 Encounter for immunization: Secondary | ICD-10-CM | POA: Diagnosis not present

## 2020-01-02 DIAGNOSIS — D509 Iron deficiency anemia, unspecified: Secondary | ICD-10-CM | POA: Diagnosis not present

## 2020-01-03 ENCOUNTER — Encounter (INDEPENDENT_AMBULATORY_CARE_PROVIDER_SITE_OTHER): Payer: Self-pay | Admitting: Nurse Practitioner

## 2020-01-03 ENCOUNTER — Other Ambulatory Visit: Payer: Self-pay

## 2020-01-03 ENCOUNTER — Ambulatory Visit (INDEPENDENT_AMBULATORY_CARE_PROVIDER_SITE_OTHER): Payer: Medicare Other | Admitting: Nurse Practitioner

## 2020-01-03 ENCOUNTER — Ambulatory Visit (INDEPENDENT_AMBULATORY_CARE_PROVIDER_SITE_OTHER): Payer: Medicare Other

## 2020-01-03 VITALS — BP 96/59 | HR 71 | Ht 64.0 in | Wt 105.0 lb

## 2020-01-03 DIAGNOSIS — Z992 Dependence on renal dialysis: Secondary | ICD-10-CM | POA: Diagnosis not present

## 2020-01-03 DIAGNOSIS — E1122 Type 2 diabetes mellitus with diabetic chronic kidney disease: Secondary | ICD-10-CM | POA: Diagnosis not present

## 2020-01-03 DIAGNOSIS — I10 Essential (primary) hypertension: Secondary | ICD-10-CM | POA: Diagnosis not present

## 2020-01-03 DIAGNOSIS — N186 End stage renal disease: Secondary | ICD-10-CM | POA: Diagnosis not present

## 2020-01-04 DIAGNOSIS — D509 Iron deficiency anemia, unspecified: Secondary | ICD-10-CM | POA: Diagnosis not present

## 2020-01-04 DIAGNOSIS — Z992 Dependence on renal dialysis: Secondary | ICD-10-CM | POA: Diagnosis not present

## 2020-01-04 DIAGNOSIS — Z23 Encounter for immunization: Secondary | ICD-10-CM | POA: Diagnosis not present

## 2020-01-04 DIAGNOSIS — N186 End stage renal disease: Secondary | ICD-10-CM | POA: Diagnosis not present

## 2020-01-04 DIAGNOSIS — E559 Vitamin D deficiency, unspecified: Secondary | ICD-10-CM | POA: Diagnosis not present

## 2020-01-04 DIAGNOSIS — N2581 Secondary hyperparathyroidism of renal origin: Secondary | ICD-10-CM | POA: Diagnosis not present

## 2020-01-05 DIAGNOSIS — F329 Major depressive disorder, single episode, unspecified: Secondary | ICD-10-CM | POA: Diagnosis not present

## 2020-01-05 DIAGNOSIS — Z79899 Other long term (current) drug therapy: Secondary | ICD-10-CM | POA: Diagnosis not present

## 2020-01-05 DIAGNOSIS — F419 Anxiety disorder, unspecified: Secondary | ICD-10-CM | POA: Diagnosis not present

## 2020-01-06 DIAGNOSIS — E559 Vitamin D deficiency, unspecified: Secondary | ICD-10-CM | POA: Diagnosis not present

## 2020-01-06 DIAGNOSIS — N186 End stage renal disease: Secondary | ICD-10-CM | POA: Diagnosis not present

## 2020-01-06 DIAGNOSIS — D509 Iron deficiency anemia, unspecified: Secondary | ICD-10-CM | POA: Diagnosis not present

## 2020-01-06 DIAGNOSIS — N2581 Secondary hyperparathyroidism of renal origin: Secondary | ICD-10-CM | POA: Diagnosis not present

## 2020-01-06 DIAGNOSIS — Z992 Dependence on renal dialysis: Secondary | ICD-10-CM | POA: Diagnosis not present

## 2020-01-06 DIAGNOSIS — Z23 Encounter for immunization: Secondary | ICD-10-CM | POA: Diagnosis not present

## 2020-01-08 ENCOUNTER — Encounter (INDEPENDENT_AMBULATORY_CARE_PROVIDER_SITE_OTHER): Payer: Self-pay | Admitting: Nurse Practitioner

## 2020-01-08 NOTE — Progress Notes (Signed)
Subjective:    Patient ID: [name], female    DOB: 11/28/1975, 44 y.o.   MRN: [id number] Chief Complaint  Patient presents with  . Follow-up    6 mo HDA    The patient returns to the office for followup of their dialysis access.  The patient is maintained with a left hero graft.  The function of the access has been stable. The patient denies increased bleeding time or increased recirculation. Patient denies difficulty with cannulation. The patient denies hand pain or other symptoms consistent with steal phenomena.  No significant arm swelling.  The patient denies redness or swelling at the access site. The patient denies fever or chills at home or while on dialysis.  The patient denies amaurosis fugax or recent TIA symptoms. There are no recent neurological changes noted. The patient denies claudication symptoms or rest pain symptoms. The patient denies history of DVT, PE or superficial thrombophlebitis. The patient denies recent episodes of angina or shortness of breath.     The patient's hero graft is patent with a flow volume of 974.  No evidence of stenosis seen.   Review of Systems  Hematological: Does not bruise/bleed easily.  All other systems reviewed and are negative.      Objective:   Physical Exam Vitals reviewed.  HENT:     Head: Normocephalic.  Cardiovascular:     Rate and Rhythm: Normal rate.     Pulses: Normal pulses.     Arteriovenous access: left arteriovenous access is present.    Comments: Good thrill and bruit left hero graft Pulmonary:     Effort: Pulmonary effort is normal.  Skin:    General: Skin is warm and dry.  Neurological:     Mental Status: She is alert and oriented to person, place, and time.  Psychiatric:        Mood and Affect: Mood normal.        Behavior: Behavior normal.        Thought Content: Thought content normal.        Cognition and Memory: Cognition is impaired.        Judgment: Judgment normal.     BP (!)  96/59   Pulse 71   Ht 5\' 4"  (1.626 m)   Wt 105 lb (47.6 kg)   BMI 18.02 kg/m   Past Medical History:  Diagnosis Date  . Anemia   . BV (bacterial vaginosis) 11/08/2013  . Depression   . Dialysis patient ([place])   . DVT (deep venous thrombosis) ([place])   . Hypertension   . Lupus ([place])   . Renal disorder   . Vaginal odor 11/08/2013    Social History   Socioeconomic History  . Marital status: Single    Spouse name: Not on file  . Number of children: Not on file  . Years of education: Not on file  . Highest education level: Not on file  Occupational History  . Not on file  Tobacco Use  . Smoking status: Current Every Day Smoker    Packs/day: 0.25    Years: 15.00    Pack years: 3.75    Types: Cigarettes  . Smokeless tobacco: Never Used  Vaping Use  . Vaping Use: Never used  Substance and Sexual Activity  . Alcohol use: No  . Drug use: No  . Sexual activity: Not Currently    Birth control/protection: None  Other Topics Concern  . Not on file  Social History Narrative   Lives  at [facility]   Social Determinants of Health   Financial Resource Strain:   . Difficulty of Paying Living Expenses: Not on file  Food Insecurity:   . Worried About Charity fundraiser in the Last Year: Not on file  . Ran Out of Food in the Last Year: Not on file  Transportation Needs:   . Lack of Transportation (Medical): Not on file  . Lack of Transportation (Non-Medical): Not on file  Physical Activity:   . Days of Exercise per Week: Not on file  . Minutes of Exercise per Session: Not on file  Stress:   . Feeling of Stress : Not on file  Social Connections:   . Frequency of Communication with Friends and Family: Not on file  . Frequency of Social Gatherings with Friends and Family: Not on file  . Attends Religious Services: Not on file  . Active Member of Clubs or Organizations: Not on file  . Attends Archivist Meetings: Not on file  . Marital Status: Not on file   Intimate Partner Violence:   . Fear of Current or Ex-Partner: Not on file  . Emotionally Abused: Not on file  . Physically Abused: Not on file  . Sexually Abused: Not on file    Past Surgical History:  Procedure Laterality Date  . A/V SHUNT INTERVENTION N/A 10/24/2016   Procedure: A/V SHUNT INTERVENTION;  Surgeon: Katha Cabal, MD;  Location: [place] [facility];  Service: Cardiovascular;  Laterality: N/A;  . A/V SHUNTOGRAM Left 12/08/2017   Procedure: A/V SHUNTOGRAM;  Surgeon: Katha Cabal, MD;  Location: [place] [facility];  Service: Cardiovascular;  Laterality: Left;  . A/V SHUNTOGRAM Left 01/28/2018   Procedure: A/V SHUNTOGRAM;  Surgeon: Algernon Huxley, MD;  Location: [place] [facility];  Service: Cardiovascular;  Laterality: Left;  . A/V SHUNTOGRAM Left 04/01/2018   Procedure: A/V SHUNTOGRAM;  Surgeon: Algernon Huxley, MD;  Location: [place] [facility];  Service: Cardiovascular;  Laterality: Left;  . A/V SHUNTOGRAM Left 04/28/2019   Procedure: A/V SHUNTOGRAM;  Surgeon: Algernon Huxley, MD;  Location: [place] [facility];  Service: Cardiovascular;  Laterality: Left;  . AV FISTULA PLACEMENT    . DIALYSIS/PERMA CATHETER INSERTION  10/23/2016   Procedure: DIALYSIS/PERMA CATHETER INSERTION;  Surgeon: Algernon Huxley, MD;  Location: [place] [facility];  Service: Cardiovascular;;  . IR REMOVAL TUN CV CATH W/O FL  09/22/2017  . IR THROMBECTOMY AV FISTULA W/THROMBOLYSIS INC/SHUNT/IMG LEFT Left 09/04/2017  . IR US GUIDE VASC ACCESS LEFT  09/04/2017  . MASS EXCISION Left 06/01/2018   Procedure: EXCISION 5 X 3CM LIPOMA LEFT THIGH;  Surgeon: Aviva Signs, MD;  Location: [facility];  Service: General;  Laterality: Left;  . PERIPHERAL VASCULAR CATHETERIZATION Left 01/04/2015   Procedure: A/V Shuntogram/Fistulagram;  Surgeon: Algernon Huxley, MD;  Location: [place] [facility];  Service: Cardiovascular;  Laterality: Left;  . PERIPHERAL VASCULAR CATHETERIZATION N/A 01/04/2015   Procedure: A/V Shunt  Intervention;  Surgeon: Algernon Huxley, MD;  Location: [place] [facility];  Service: Cardiovascular;  Laterality: N/A;  . PERIPHERAL VASCULAR CATHETERIZATION Left 04/13/2015   Procedure: A/V Shuntogram/Fistulagram;  Surgeon: Katha Cabal, MD;  Location: [place] [facility];  Service: Cardiovascular;  Laterality: Left;  . PERIPHERAL VASCULAR CATHETERIZATION Left 04/13/2015   Procedure: A/V Shunt Intervention;  Surgeon: Katha Cabal, MD;  Location: [place] [facility];  Service: Cardiovascular;  Laterality: Left;  Family History  Problem Relation Age of Onset  . Seizures Son   . Kidney disease Mother        on dialysis  . Lupus Mother     Allergies  Allergen Reactions  . Dust Mite Extract Other (See Comments)    sneezing       Assessment & Plan:   1. ESRD (end stage renal disease) on dialysis [facility]) Recommend:  The patient is doing well and currently has adequate dialysis access. The patient's dialysis center is not reporting any access issues. Flow pattern is stable when compared to the prior ultrasound.  The patient should have a duplex ultrasound of the dialysis access in 6 months. The patient will follow-up with me in the office after each ultrasound     2. Essential (primary) hypertension Continue antihypertensive medications as already ordered, these medications have been reviewed and there are no changes at this time.   3. Type 2 diabetes mellitus with chronic kidney disease on chronic dialysis, unspecified whether long term insulin use ([place]) Continue hypoglycemic medications as already ordered, these medications have been reviewed and there are no changes at this time.  Hgb A1C to be monitored as already arranged by primary service    Current Outpatient Medications on File Prior to Visit  Medication Sig Dispense Refill  . albuterol (PROVENTIL) (2.5 MG/3ML) 0.083% nebulizer solution Take 2.5 mg by nebulization 4 (four) times daily as needed for wheezing  or shortness of breath.     . ALPRAZolam (XANAX) 0.25 MG tablet Take 0.25 mg by mouth 2 (two) times daily.     Marland Kitchen aspirin EC 81 MG tablet Take 81 mg by mouth daily.    . chlorhexidine (PERIDEX) 0.12 % solution Use as directed 5 mLs in the mouth or throat 2 (two) times daily as needed.     . clopidogrel (PLAVIX) 75 MG tablet Take 75 mg by mouth daily.    Marland Kitchen docusate sodium (COLACE) 100 MG capsule Take 100 mg by mouth daily.    Marland Kitchen epoetin alfa (EPOGEN,PROCRIT) 4000 UNIT/ML injection Inject 4,000 Units into the vein See admin instructions. Mon, wed, and Friday at dialysis     . guaiFENesin (ROBITUSSIN) 100 MG/5ML liquid Take 200 mg by mouth every 6 (six) hours as needed for cough.     Marland Kitchen HYDROcodone-acetaminophen (NORCO) 5-325 MG tablet Take 1 tablet by mouth every 6 (six) hours as needed for moderate pain. 20 tablet 0  . hydroxychloroquine (PLAQUENIL) 200 MG tablet Take 200 mg by mouth daily.    Marland Kitchen ipratropium (ATROVENT) 0.02 % nebulizer solution Take 250 mcg by nebulization 4 (four) times daily as needed for wheezing or shortness of breath.     . lidocaine-prilocaine (EMLA) cream Apply 1 application topically as needed (port access).     Marland Kitchen loratadine (CLARITIN) 10 MG tablet Take 10 mg by mouth daily as needed for allergies.    . midodrine (PROAMATINE) 10 MG tablet Take 10 mg by mouth 2 (two) times daily.     . mirtazapine (REMERON) 15 MG tablet Take 7.5 mg by mouth at bedtime.     . multivitamin (RENA-VIT) TABS tablet Take 1 tablet by mouth daily.    . pantoprazole (PROTONIX) 40 MG tablet Take 40 mg by mouth daily.    . phenytoin (DILANTIN) 100 MG ER capsule Take 100 mg by mouth 2 (two) times daily.     . sertraline (ZOLOFT) 100 MG tablet Take 200 mg by mouth daily.     Marland Kitchen  sevelamer carbonate (RENVELA) 800 MG tablet Take 800 mg by mouth See admin instructions. Take 800 mg by mouth three times daily with meals and 800 mg by mouth twice daily with snacks    . simvastatin (ZOCOR) 40 MG tablet Take 40 mg by  mouth at bedtime.      No current facility-administered medications on file prior to visit.    There are no Patient Instructions on file for this visit. No follow-ups on file.   Kris Hartmann, NP

## 2020-01-09 DIAGNOSIS — Z992 Dependence on renal dialysis: Secondary | ICD-10-CM | POA: Diagnosis not present

## 2020-01-09 DIAGNOSIS — E559 Vitamin D deficiency, unspecified: Secondary | ICD-10-CM | POA: Diagnosis not present

## 2020-01-09 DIAGNOSIS — Z23 Encounter for immunization: Secondary | ICD-10-CM | POA: Diagnosis not present

## 2020-01-09 DIAGNOSIS — D509 Iron deficiency anemia, unspecified: Secondary | ICD-10-CM | POA: Diagnosis not present

## 2020-01-09 DIAGNOSIS — N2581 Secondary hyperparathyroidism of renal origin: Secondary | ICD-10-CM | POA: Diagnosis not present

## 2020-01-09 DIAGNOSIS — N186 End stage renal disease: Secondary | ICD-10-CM | POA: Diagnosis not present

## 2020-01-11 DIAGNOSIS — N2581 Secondary hyperparathyroidism of renal origin: Secondary | ICD-10-CM | POA: Diagnosis not present

## 2020-01-11 DIAGNOSIS — Z23 Encounter for immunization: Secondary | ICD-10-CM | POA: Diagnosis not present

## 2020-01-11 DIAGNOSIS — Z992 Dependence on renal dialysis: Secondary | ICD-10-CM | POA: Diagnosis not present

## 2020-01-11 DIAGNOSIS — E559 Vitamin D deficiency, unspecified: Secondary | ICD-10-CM | POA: Diagnosis not present

## 2020-01-11 DIAGNOSIS — N186 End stage renal disease: Secondary | ICD-10-CM | POA: Diagnosis not present

## 2020-01-11 DIAGNOSIS — D509 Iron deficiency anemia, unspecified: Secondary | ICD-10-CM | POA: Diagnosis not present

## 2020-01-13 DIAGNOSIS — D509 Iron deficiency anemia, unspecified: Secondary | ICD-10-CM | POA: Diagnosis not present

## 2020-01-13 DIAGNOSIS — Z79899 Other long term (current) drug therapy: Secondary | ICD-10-CM | POA: Diagnosis not present

## 2020-01-13 DIAGNOSIS — E559 Vitamin D deficiency, unspecified: Secondary | ICD-10-CM | POA: Diagnosis not present

## 2020-01-13 DIAGNOSIS — N186 End stage renal disease: Secondary | ICD-10-CM | POA: Diagnosis not present

## 2020-01-13 DIAGNOSIS — Z23 Encounter for immunization: Secondary | ICD-10-CM | POA: Diagnosis not present

## 2020-01-13 DIAGNOSIS — N2581 Secondary hyperparathyroidism of renal origin: Secondary | ICD-10-CM | POA: Diagnosis not present

## 2020-01-13 DIAGNOSIS — Z992 Dependence on renal dialysis: Secondary | ICD-10-CM | POA: Diagnosis not present

## 2020-01-15 DIAGNOSIS — Z992 Dependence on renal dialysis: Secondary | ICD-10-CM | POA: Diagnosis not present

## 2020-01-15 DIAGNOSIS — N186 End stage renal disease: Secondary | ICD-10-CM | POA: Diagnosis not present

## 2020-01-16 DIAGNOSIS — N186 End stage renal disease: Secondary | ICD-10-CM | POA: Diagnosis not present

## 2020-01-16 DIAGNOSIS — D631 Anemia in chronic kidney disease: Secondary | ICD-10-CM | POA: Diagnosis not present

## 2020-01-16 DIAGNOSIS — Z992 Dependence on renal dialysis: Secondary | ICD-10-CM | POA: Diagnosis not present

## 2020-01-16 DIAGNOSIS — N2581 Secondary hyperparathyroidism of renal origin: Secondary | ICD-10-CM | POA: Diagnosis not present

## 2020-01-16 DIAGNOSIS — D509 Iron deficiency anemia, unspecified: Secondary | ICD-10-CM | POA: Diagnosis not present

## 2020-01-16 DIAGNOSIS — E559 Vitamin D deficiency, unspecified: Secondary | ICD-10-CM | POA: Diagnosis not present

## 2020-01-18 DIAGNOSIS — N186 End stage renal disease: Secondary | ICD-10-CM | POA: Diagnosis not present

## 2020-01-18 DIAGNOSIS — Z992 Dependence on renal dialysis: Secondary | ICD-10-CM | POA: Diagnosis not present

## 2020-01-18 DIAGNOSIS — D509 Iron deficiency anemia, unspecified: Secondary | ICD-10-CM | POA: Diagnosis not present

## 2020-01-18 DIAGNOSIS — D631 Anemia in chronic kidney disease: Secondary | ICD-10-CM | POA: Diagnosis not present

## 2020-01-18 DIAGNOSIS — N2581 Secondary hyperparathyroidism of renal origin: Secondary | ICD-10-CM | POA: Diagnosis not present

## 2020-01-18 DIAGNOSIS — E559 Vitamin D deficiency, unspecified: Secondary | ICD-10-CM | POA: Diagnosis not present

## 2020-01-20 DIAGNOSIS — D631 Anemia in chronic kidney disease: Secondary | ICD-10-CM | POA: Diagnosis not present

## 2020-01-20 DIAGNOSIS — D509 Iron deficiency anemia, unspecified: Secondary | ICD-10-CM | POA: Diagnosis not present

## 2020-01-20 DIAGNOSIS — N2581 Secondary hyperparathyroidism of renal origin: Secondary | ICD-10-CM | POA: Diagnosis not present

## 2020-01-20 DIAGNOSIS — N186 End stage renal disease: Secondary | ICD-10-CM | POA: Diagnosis not present

## 2020-01-20 DIAGNOSIS — E559 Vitamin D deficiency, unspecified: Secondary | ICD-10-CM | POA: Diagnosis not present

## 2020-01-20 DIAGNOSIS — Z992 Dependence on renal dialysis: Secondary | ICD-10-CM | POA: Diagnosis not present

## 2020-01-23 DIAGNOSIS — N186 End stage renal disease: Secondary | ICD-10-CM | POA: Diagnosis not present

## 2020-01-23 DIAGNOSIS — D509 Iron deficiency anemia, unspecified: Secondary | ICD-10-CM | POA: Diagnosis not present

## 2020-01-23 DIAGNOSIS — N2581 Secondary hyperparathyroidism of renal origin: Secondary | ICD-10-CM | POA: Diagnosis not present

## 2020-01-23 DIAGNOSIS — D631 Anemia in chronic kidney disease: Secondary | ICD-10-CM | POA: Diagnosis not present

## 2020-01-23 DIAGNOSIS — Z992 Dependence on renal dialysis: Secondary | ICD-10-CM | POA: Diagnosis not present

## 2020-01-23 DIAGNOSIS — E559 Vitamin D deficiency, unspecified: Secondary | ICD-10-CM | POA: Diagnosis not present

## 2020-01-25 DIAGNOSIS — Z992 Dependence on renal dialysis: Secondary | ICD-10-CM | POA: Diagnosis not present

## 2020-01-25 DIAGNOSIS — E559 Vitamin D deficiency, unspecified: Secondary | ICD-10-CM | POA: Diagnosis not present

## 2020-01-25 DIAGNOSIS — D509 Iron deficiency anemia, unspecified: Secondary | ICD-10-CM | POA: Diagnosis not present

## 2020-01-25 DIAGNOSIS — N2581 Secondary hyperparathyroidism of renal origin: Secondary | ICD-10-CM | POA: Diagnosis not present

## 2020-01-25 DIAGNOSIS — N186 End stage renal disease: Secondary | ICD-10-CM | POA: Diagnosis not present

## 2020-01-25 DIAGNOSIS — D631 Anemia in chronic kidney disease: Secondary | ICD-10-CM | POA: Diagnosis not present

## 2020-01-27 DIAGNOSIS — D509 Iron deficiency anemia, unspecified: Secondary | ICD-10-CM | POA: Diagnosis not present

## 2020-01-27 DIAGNOSIS — N186 End stage renal disease: Secondary | ICD-10-CM | POA: Diagnosis not present

## 2020-01-27 DIAGNOSIS — Z992 Dependence on renal dialysis: Secondary | ICD-10-CM | POA: Diagnosis not present

## 2020-01-27 DIAGNOSIS — N2581 Secondary hyperparathyroidism of renal origin: Secondary | ICD-10-CM | POA: Diagnosis not present

## 2020-01-27 DIAGNOSIS — E559 Vitamin D deficiency, unspecified: Secondary | ICD-10-CM | POA: Diagnosis not present

## 2020-01-27 DIAGNOSIS — D631 Anemia in chronic kidney disease: Secondary | ICD-10-CM | POA: Diagnosis not present

## 2020-01-30 DIAGNOSIS — N186 End stage renal disease: Secondary | ICD-10-CM | POA: Diagnosis not present

## 2020-01-30 DIAGNOSIS — N2581 Secondary hyperparathyroidism of renal origin: Secondary | ICD-10-CM | POA: Diagnosis not present

## 2020-01-30 DIAGNOSIS — E559 Vitamin D deficiency, unspecified: Secondary | ICD-10-CM | POA: Diagnosis not present

## 2020-01-30 DIAGNOSIS — D631 Anemia in chronic kidney disease: Secondary | ICD-10-CM | POA: Diagnosis not present

## 2020-01-30 DIAGNOSIS — D509 Iron deficiency anemia, unspecified: Secondary | ICD-10-CM | POA: Diagnosis not present

## 2020-01-30 DIAGNOSIS — Z992 Dependence on renal dialysis: Secondary | ICD-10-CM | POA: Diagnosis not present

## 2020-02-01 DIAGNOSIS — D509 Iron deficiency anemia, unspecified: Secondary | ICD-10-CM | POA: Diagnosis not present

## 2020-02-01 DIAGNOSIS — E559 Vitamin D deficiency, unspecified: Secondary | ICD-10-CM | POA: Diagnosis not present

## 2020-02-01 DIAGNOSIS — N186 End stage renal disease: Secondary | ICD-10-CM | POA: Diagnosis not present

## 2020-02-01 DIAGNOSIS — N2581 Secondary hyperparathyroidism of renal origin: Secondary | ICD-10-CM | POA: Diagnosis not present

## 2020-02-01 DIAGNOSIS — D631 Anemia in chronic kidney disease: Secondary | ICD-10-CM | POA: Diagnosis not present

## 2020-02-01 DIAGNOSIS — Z992 Dependence on renal dialysis: Secondary | ICD-10-CM | POA: Diagnosis not present

## 2020-02-03 DIAGNOSIS — N186 End stage renal disease: Secondary | ICD-10-CM | POA: Diagnosis not present

## 2020-02-03 DIAGNOSIS — D509 Iron deficiency anemia, unspecified: Secondary | ICD-10-CM | POA: Diagnosis not present

## 2020-02-03 DIAGNOSIS — D631 Anemia in chronic kidney disease: Secondary | ICD-10-CM | POA: Diagnosis not present

## 2020-02-03 DIAGNOSIS — E559 Vitamin D deficiency, unspecified: Secondary | ICD-10-CM | POA: Diagnosis not present

## 2020-02-03 DIAGNOSIS — Z992 Dependence on renal dialysis: Secondary | ICD-10-CM | POA: Diagnosis not present

## 2020-02-03 DIAGNOSIS — N2581 Secondary hyperparathyroidism of renal origin: Secondary | ICD-10-CM | POA: Diagnosis not present

## 2020-02-06 DIAGNOSIS — N2581 Secondary hyperparathyroidism of renal origin: Secondary | ICD-10-CM | POA: Diagnosis not present

## 2020-02-06 DIAGNOSIS — D509 Iron deficiency anemia, unspecified: Secondary | ICD-10-CM | POA: Diagnosis not present

## 2020-02-06 DIAGNOSIS — N186 End stage renal disease: Secondary | ICD-10-CM | POA: Diagnosis not present

## 2020-02-06 DIAGNOSIS — E559 Vitamin D deficiency, unspecified: Secondary | ICD-10-CM | POA: Diagnosis not present

## 2020-02-06 DIAGNOSIS — D631 Anemia in chronic kidney disease: Secondary | ICD-10-CM | POA: Diagnosis not present

## 2020-02-06 DIAGNOSIS — Z992 Dependence on renal dialysis: Secondary | ICD-10-CM | POA: Diagnosis not present

## 2020-02-08 ENCOUNTER — Encounter (HOSPITAL_COMMUNITY): Payer: Self-pay | Admitting: Emergency Medicine

## 2020-02-08 ENCOUNTER — Emergency Department (HOSPITAL_COMMUNITY): Payer: Medicare Other

## 2020-02-08 ENCOUNTER — Other Ambulatory Visit: Payer: Self-pay

## 2020-02-08 ENCOUNTER — Emergency Department (HOSPITAL_COMMUNITY)
Admission: EM | Admit: 2020-02-08 | Discharge: 2020-02-08 | Disposition: A | Discharge status: Home / Self Care | Payer: Medicare Other | Attending: Emergency Medicine | Admitting: Emergency Medicine

## 2020-02-08 DIAGNOSIS — D631 Anemia in chronic kidney disease: Secondary | ICD-10-CM | POA: Diagnosis not present

## 2020-02-08 DIAGNOSIS — K056 Periodontal disease, unspecified: Secondary | ICD-10-CM | POA: Diagnosis not present

## 2020-02-08 DIAGNOSIS — J45909 Unspecified asthma, uncomplicated: Secondary | ICD-10-CM | POA: Diagnosis not present

## 2020-02-08 DIAGNOSIS — Z86018 Personal history of other benign neoplasm: Secondary | ICD-10-CM | POA: Insufficient documentation

## 2020-02-08 DIAGNOSIS — E1140 Type 2 diabetes mellitus with diabetic neuropathy, unspecified: Secondary | ICD-10-CM | POA: Insufficient documentation

## 2020-02-08 DIAGNOSIS — D509 Iron deficiency anemia, unspecified: Secondary | ICD-10-CM | POA: Diagnosis not present

## 2020-02-08 DIAGNOSIS — Z7982 Long term (current) use of aspirin: Secondary | ICD-10-CM | POA: Insufficient documentation

## 2020-02-08 DIAGNOSIS — F17210 Nicotine dependence, cigarettes, uncomplicated: Secondary | ICD-10-CM | POA: Diagnosis not present

## 2020-02-08 DIAGNOSIS — E1122 Type 2 diabetes mellitus with diabetic chronic kidney disease: Secondary | ICD-10-CM | POA: Diagnosis not present

## 2020-02-08 DIAGNOSIS — J392 Other diseases of pharynx: Secondary | ICD-10-CM | POA: Diagnosis not present

## 2020-02-08 DIAGNOSIS — K047 Periapical abscess without sinus: Secondary | ICD-10-CM | POA: Insufficient documentation

## 2020-02-08 DIAGNOSIS — K0889 Other specified disorders of teeth and supporting structures: Secondary | ICD-10-CM | POA: Diagnosis present

## 2020-02-08 DIAGNOSIS — J3489 Other specified disorders of nose and nasal sinuses: Secondary | ICD-10-CM | POA: Diagnosis not present

## 2020-02-08 DIAGNOSIS — Z992 Dependence on renal dialysis: Secondary | ICD-10-CM | POA: Insufficient documentation

## 2020-02-08 DIAGNOSIS — F419 Anxiety disorder, unspecified: Secondary | ICD-10-CM | POA: Diagnosis not present

## 2020-02-08 DIAGNOSIS — J320 Chronic maxillary sinusitis: Secondary | ICD-10-CM | POA: Diagnosis not present

## 2020-02-08 DIAGNOSIS — E1165 Type 2 diabetes mellitus with hyperglycemia: Secondary | ICD-10-CM | POA: Diagnosis not present

## 2020-02-08 DIAGNOSIS — E559 Vitamin D deficiency, unspecified: Secondary | ICD-10-CM | POA: Diagnosis not present

## 2020-02-08 DIAGNOSIS — N186 End stage renal disease: Secondary | ICD-10-CM | POA: Insufficient documentation

## 2020-02-08 DIAGNOSIS — Z79899 Other long term (current) drug therapy: Secondary | ICD-10-CM | POA: Insufficient documentation

## 2020-02-08 DIAGNOSIS — F329 Major depressive disorder, single episode, unspecified: Secondary | ICD-10-CM | POA: Diagnosis not present

## 2020-02-08 DIAGNOSIS — I120 Hypertensive chronic kidney disease with stage 5 chronic kidney disease or end stage renal disease: Secondary | ICD-10-CM | POA: Diagnosis not present

## 2020-02-08 DIAGNOSIS — N2581 Secondary hyperparathyroidism of renal origin: Secondary | ICD-10-CM | POA: Diagnosis not present

## 2020-02-08 LAB — COMPREHENSIVE METABOLIC PANEL
ALT: 18 U/L (ref 0–44)
AST: 18 U/L (ref 15–41)
Albumin: 4.1 g/dL (ref 3.5–5.0)
Alkaline Phosphatase: 102 U/L (ref 38–126)
Anion gap: 17 — ABNORMAL HIGH (ref 5–15)
BUN: 22 mg/dL — ABNORMAL HIGH (ref 6–20)
CO2: 26 mmol/L (ref 22–32)
Calcium: 8.4 mg/dL — ABNORMAL LOW (ref 8.9–10.3)
Chloride: 90 mmol/L — ABNORMAL LOW (ref 98–111)
Creatinine, Ser: 6.25 mg/dL — ABNORMAL HIGH (ref 0.44–1.00)
GFR, Estimated: 8 mL/min — ABNORMAL LOW (ref >60)
Glucose, Bld: 92 mg/dL (ref 70–99)
Potassium: 3.3 mmol/L — ABNORMAL LOW (ref 3.5–5.1)
Sodium: 133 mmol/L — ABNORMAL LOW (ref 135–145)
Total Bilirubin: 0.5 mg/dL (ref 0.3–1.2)
Total Protein: 8.5 g/dL — ABNORMAL HIGH (ref 6.5–8.1)

## 2020-02-08 LAB — CBC WITH DIFFERENTIAL/PLATELET
Abs Immature Granulocytes: 0.14 10*3/uL — ABNORMAL HIGH (ref 0.00–0.07)
Basophils Absolute: 0.1 10*3/uL (ref 0.0–0.1)
Basophils Relative: 1 %
Eosinophils Absolute: 0 10*3/uL (ref 0.0–0.5)
Eosinophils Relative: 0 %
HCT: 39.1 % (ref 36.0–46.0)
Hemoglobin: 13.6 g/dL (ref 12.0–15.0)
Immature Granulocytes: 1 %
Lymphocytes Relative: 4 %
Lymphs Abs: 1 10*3/uL (ref 0.7–4.0)
MCH: 30.8 pg (ref 26.0–34.0)
MCHC: 34.8 g/dL (ref 30.0–36.0)
MCV: 88.7 fL (ref 80.0–100.0)
Monocytes Absolute: 1.3 10*3/uL — ABNORMAL HIGH (ref 0.1–1.0)
Monocytes Relative: 6 %
Neutro Abs: 21.8 10*3/uL — ABNORMAL HIGH (ref 1.7–7.7)
Neutrophils Relative %: 88 %
Platelets: 181 10*3/uL (ref 150–400)
RBC: 4.41 MIL/uL (ref 3.87–5.11)
RDW: 13.6 % (ref 11.5–15.5)
WBC: 24.4 10*3/uL — ABNORMAL HIGH (ref 4.0–10.5)
nRBC: 0 % (ref 0.0–0.2)

## 2020-02-08 MED ORDER — LIDOCAINE HCL (PF) 2 % IJ SOLN
INTRAMUSCULAR | Status: AC
  Filled 2020-02-08: qty 10

## 2020-02-08 MED ORDER — POTASSIUM CHLORIDE CRYS ER 20 MEQ PO TBCR
20.0000 meq | EXTENDED_RELEASE_TABLET | Freq: Once | ORAL | Status: AC
  Administered 2020-02-08: 20 meq via ORAL
  Filled 2020-02-08: qty 1

## 2020-02-08 MED ORDER — ACETAMINOPHEN 325 MG PO TABS
650.0000 mg | ORAL_TABLET | Freq: Once | ORAL | Status: AC
  Administered 2020-02-08: 650 mg via ORAL
  Filled 2020-02-08: qty 2

## 2020-02-08 MED ORDER — TRAMADOL HCL 50 MG PO TABS: 25.0000 mg | ORAL_TABLET | Freq: Two times a day (BID) | ORAL | 0 refills | Status: DC | PRN

## 2020-02-08 MED ORDER — LIDOCAINE HCL (PF) 2 % IJ SOLN
INTRAMUSCULAR | Status: AC
  Administered 2020-02-08: 5 mL via INTRADERMAL
  Filled 2020-02-08: qty 10

## 2020-02-08 MED ORDER — CLINDAMYCIN HCL 150 MG PO CAPS: 450.0000 mg | ORAL_CAPSULE | Freq: Three times a day (TID) | ORAL | 0 refills | Status: DC

## 2020-02-08 MED ORDER — CLINDAMYCIN HCL 150 MG PO CAPS: 450.0000 mg | ORAL_CAPSULE | Freq: Three times a day (TID) | ORAL | 0 refills | Status: AC

## 2020-02-08 MED ORDER — TRAMADOL HCL 50 MG PO TABS: 25.0000 mg | ORAL_TABLET | Freq: Two times a day (BID) | ORAL | 0 refills | Status: AC | PRN

## 2020-02-08 MED ORDER — CLINDAMYCIN HCL 150 MG PO CAPS
450.0000 mg | ORAL_CAPSULE | Freq: Once | ORAL | Status: AC
  Administered 2020-02-08: 450 mg via ORAL
  Filled 2020-02-08: qty 3

## 2020-02-08 MED ORDER — LIDOCAINE HCL (PF) 2 % IJ SOLN: 5.0000 mL | Freq: Once | INTRAMUSCULAR | Status: AC

## 2020-02-08 MED ORDER — IOHEXOL 300 MG/ML  SOLN
75.0000 mL | Freq: Once | INTRAMUSCULAR | Status: AC | PRN
  Administered 2020-02-08: 75 mL via INTRAVENOUS

## 2020-02-08 MED ORDER — TRAMADOL HCL 50 MG PO TABS
50.0000 mg | ORAL_TABLET | Freq: Once | ORAL | Status: AC
  Administered 2020-02-08: 50 mg via ORAL
  Filled 2020-02-08: qty 1

## 2020-02-08 NOTE — ED Triage Notes (Signed)
Pt c/o dental pain on the lower left side that began this morning. Swelling noted to the lower left jaw.

## 2020-02-08 NOTE — Discharge Instructions (Addendum)
Please take your clindamycin, as directed.  You had a dental abscess here in the ED.  You will need to follow-up with your dentist for ongoing evaluation and management.  The CT shows an abscess on the hard palate and that it appears to be odontogenic in origin, meaning that it is related to cavities. We have performed an incision and drainage here in the ED, mildly limited by your discomfort.  It is certainly improved clinically. Your dentist will need to provide ongoing intervention and if they do not feel capable of management, then you will need to be seen by an oral surgeon.  This is not something for an ENT doctor.    Please continue to take Tylenol as needed for pain and fever control.  I have also prescribed you tramadol which you can take twice daily x 5 days.  Only take if pain uncontrolled with Tylenol.  Do not combine with other narcotic medications.  Be careful combining with other CNS depressive medications such as Remeron.  Online, it says that the [facility] on [address] in [place] is open tomorrow from 9AM-6PM.  Please go there to pick up your prescriptions.  She will need to take her antibiotics as directed.   Return to the ED or seek immediate medical attention should you develop any new or worsening symptoms.    You were given narcotic and or sedative medications while in the emergency department. Do not drive. Do not use machinery or power tools. Do not sign legal documents. Do not drink alcohol. Do not take sleeping pills. Do not supervise children by yourself. Do not participate in activities that require climbing or being in high places.

## 2020-02-08 NOTE — ED Provider Notes (Signed)
[facility] EMERGENCY DEPARTMENT Provider Note   CSN: [id number] Arrival date & time: 02/08/20  1134     History Chief Complaint  Patient presents with  . Dental Pain    [name] is a 44 y.o. female with PMH of ESRD on HD MWF who presents the ED with complaints of dental pain.  On my examination, she is actually complaining of pain involving a large mass on her hard palate.  She states that she has had this mass chronically and that intermittently it will become painful.  She states that she had discussed surgical excision with her dentist in the past.  She does have a history of multiple dental extractions.  She has been taking Tylenol at home, with little relief of her pain.  She states that her pain just began this morning.  She does have a primary care provider, but they are unavailable to see her until next week given the holiday.  She denies any recent fevers or chills, or symptoms of infection, difficulty eating or drinking, difficulty swallowing, drooling, shortness of breath, or other symptoms.  She is then accompanied by her helper, [name], who tells me that patient is a very poor historian and the dentist who they saw this morning told her that she needed to come to the ED for a CT and ENT consult.  She also is less certain as to the chronicity of her hard palate mass.  She also tells me that the long term care facility where she lives reported that she has not been eating as well since onset of her oral pain. Patient's dentist is Dr. Lucilla Lame.    HPI     Past Medical History:  Diagnosis Date  . Anemia   . BV (bacterial vaginosis) 11/08/2013  . Depression   . Dialysis patient ([place])   . DVT (deep venous thrombosis) ([place])   . Hypertension   . Lupus ([place])   . Renal disorder   . Vaginal odor 11/08/2013    Patient Active Problem List   Diagnosis Date Noted  . Diabetes ([place]) 11/02/2018  . Lipoma of extremity   . Hyperkalemia 10/23/2016  . Complication of  vascular access for dialysis 04/01/2016  . Epilepsy, unspecified, not intractable, with status epilepticus ([place]) 10/01/2015  . Generalized muscle weakness 10/01/2015  . Vaginal discharge 11/08/2013  . Vaginal odor 11/08/2013  . BV (bacterial vaginosis) 11/08/2013  . End stage renal disease ([place]) 12/27/2010  . Anemia in chronic kidney disease 12/27/2010  . Major depressive disorder with single episode 12/27/2010  . Secondary hyperparathyroidism of renal origin ([place]) 12/27/2010  . Systemic lupus erythematosus, organ or system involvement unspecified ([place]) 12/27/2010  . Type 2 diabetes mellitus without complication ([place]) 38/46/6599  . SWELLING, NECK 04/02/2007  . ASTHMA 03/15/2007  . FOOT PAIN, RIGHT 03/15/2007  . COUGH 03/15/2007  . MENTAL RETARDATION, MODERATE 01/19/2007  . STATUS, MENTAL, ALTERED 07/16/2006  . ANOREXIA 05/26/2006  . Disease of pericardium 05/15/2006  . Tachycardia 05/15/2006  . Lupus ([place]) 04/01/2006  . ANEMIA-NOS 03/31/2006  . LEUKOCYTOSIS 03/31/2006  . DEPRESSION 03/31/2006  . MIGRAINE HEADACHE 03/31/2006  . PERIPHERAL NEUROPATHY 03/31/2006  . Essential (primary) hypertension 03/31/2006  . GERD 03/31/2006  . OSTEOARTHRITIS 03/31/2006  . HYPERGLYCEMIA 03/31/2006    Past Surgical History:  Procedure Laterality Date  . A/V SHUNT INTERVENTION N/A 10/24/2016   Procedure: A/V SHUNT INTERVENTION;  Surgeon: Katha Cabal, MD;  Location: [place] [facility];  Service: Cardiovascular;  Laterality: N/A;  .  A/V SHUNTOGRAM Left 12/08/2017   Procedure: A/V SHUNTOGRAM;  Surgeon: Katha Cabal, MD;  Location: [place] [facility];  Service: Cardiovascular;  Laterality: Left;  . A/V SHUNTOGRAM Left 01/28/2018   Procedure: A/V SHUNTOGRAM;  Surgeon: Algernon Huxley, MD;  Location: [place] [facility];  Service: Cardiovascular;  Laterality: Left;  . A/V SHUNTOGRAM Left 04/01/2018   Procedure: A/V SHUNTOGRAM;  Surgeon: Algernon Huxley, MD;  Location: [place] [facility];   Service: Cardiovascular;  Laterality: Left;  . A/V SHUNTOGRAM Left 04/28/2019   Procedure: A/V SHUNTOGRAM;  Surgeon: Algernon Huxley, MD;  Location: [place] [facility];  Service: Cardiovascular;  Laterality: Left;  . AV FISTULA PLACEMENT    . DIALYSIS/PERMA CATHETER INSERTION  10/23/2016   Procedure: DIALYSIS/PERMA CATHETER INSERTION;  Surgeon: Algernon Huxley, MD;  Location: [place] [facility];  Service: Cardiovascular;;  . IR REMOVAL TUN CV CATH W/O FL  09/22/2017  . IR THROMBECTOMY AV FISTULA W/THROMBOLYSIS INC/SHUNT/IMG LEFT Left 09/04/2017  . IR US GUIDE VASC ACCESS LEFT  09/04/2017  . MASS EXCISION Left 06/01/2018   Procedure: EXCISION 5 X 3CM LIPOMA LEFT THIGH;  Surgeon: Aviva Signs, MD;  Location: [facility];  Service: General;  Laterality: Left;  . PERIPHERAL VASCULAR CATHETERIZATION Left 01/04/2015   Procedure: A/V Shuntogram/Fistulagram;  Surgeon: Algernon Huxley, MD;  Location: [place] [facility];  Service: Cardiovascular;  Laterality: Left;  . PERIPHERAL VASCULAR CATHETERIZATION N/A 01/04/2015   Procedure: A/V Shunt Intervention;  Surgeon: Algernon Huxley, MD;  Location: [place] [facility];  Service: Cardiovascular;  Laterality: N/A;  . PERIPHERAL VASCULAR CATHETERIZATION Left 04/13/2015   Procedure: A/V Shuntogram/Fistulagram;  Surgeon: Katha Cabal, MD;  Location: [place] [facility];  Service: Cardiovascular;  Laterality: Left;  . PERIPHERAL VASCULAR CATHETERIZATION Left 04/13/2015   Procedure: A/V Shunt Intervention;  Surgeon: Katha Cabal, MD;  Location: [place] [facility];  Service: Cardiovascular;  Laterality: Left;     OB History    Gravida  3   Para  3   Term  3   Preterm      AB      Living  2     SAB      TAB      Ectopic      Multiple      Live Births  2           Family History  Problem Relation Age of Onset  . Seizures Son   . Kidney disease Mother        on dialysis  . Lupus Mother     Social History   Tobacco Use  . Smoking status:  Current Every Day Smoker    Packs/day: 0.25    Years: 15.00    Pack years: 3.75    Types: Cigarettes  . Smokeless tobacco: Never Used  Vaping Use  . Vaping Use: Never used  Substance Use Topics  . Alcohol use: No  . Drug use: No    Home Medications Prior to Admission medications   Medication Sig Start Date End Date Taking? Authorizing Provider  albuterol (PROVENTIL) (2.5 MG/3ML) 0.083% nebulizer solution Take 2.5 mg by nebulization 4 (four) times daily as needed for wheezing or shortness of breath.     [provider]  ALPRAZolam Duanne Moron) 0.25 MG tablet Take 0.25 mg by mouth 2 (two) times daily.     [provider]  aspirin EC 81 MG tablet Take 81 mg by  mouth daily.    [provider]  chlorhexidine (PERIDEX) 0.12 % solution Use as directed 5 mLs in the mouth or throat 2 (two) times daily as needed.     [provider]  clindamycin (CLEOCIN) 150 MG capsule Take 3 capsules (450 mg total) by mouth 3 (three) times daily for 7 days. 02/08/20 02/15/20  Corena Herter, PA-C  clopidogrel (PLAVIX) 75 MG tablet Take 75 mg by mouth daily.    [provider]  docusate sodium (COLACE) 100 MG capsule Take 100 mg by mouth daily.    [provider]  epoetin alfa (EPOGEN,PROCRIT) 4000 UNIT/ML injection Inject 4,000 Units into the vein See admin instructions. Mon, wed, and Friday at dialysis     [provider]  guaiFENesin (ROBITUSSIN) 100 MG/5ML liquid Take 200 mg by mouth every 6 (six) hours as needed for cough.     [provider]  HYDROcodone-acetaminophen (NORCO) 5-325 MG tablet Take 1 tablet by mouth every 6 (six) hours as needed for moderate pain. 06/01/18   Aviva Signs, MD  hydroxychloroquine (PLAQUENIL) 200 MG tablet Take 200 mg by mouth daily.    [provider]  ipratropium (ATROVENT) 0.02 % nebulizer solution Take 250 mcg by nebulization 4 (four) times daily as needed for wheezing or shortness of breath.      [provider]  lidocaine-prilocaine (EMLA) cream Apply 1 application topically as needed (port access).  12/20/12   [provider]  loratadine (CLARITIN) 10 MG tablet Take 10 mg by mouth daily as needed for allergies.    [provider]  midodrine (PROAMATINE) 10 MG tablet Take 10 mg by mouth 2 (two) times daily.     [provider]  mirtazapine (REMERON) 15 MG tablet Take 7.5 mg by mouth at bedtime.  11/03/13   [provider]  multivitamin (RENA-VIT) TABS tablet Take 1 tablet by mouth daily.    [provider]  pantoprazole (PROTONIX) 40 MG tablet Take 40 mg by mouth daily.    [provider]  phenytoin (DILANTIN) 100 MG ER capsule Take 100 mg by mouth 2 (two) times daily.     [provider]  sertraline (ZOLOFT) 100 MG tablet Take 200 mg by mouth daily.     [provider]  sevelamer carbonate (RENVELA) 800 MG tablet Take 800 mg by mouth See admin instructions. Take 800 mg by mouth three times daily with meals and 800 mg by mouth twice daily with snacks    [provider]  simvastatin (ZOCOR) 40 MG tablet Take 40 mg by mouth at bedtime.     [provider]  traMADol (ULTRAM) 50 MG tablet Take 0.5 tablets (25 mg total) by mouth 2 (two) times daily as needed for up to 5 days for severe pain. 02/08/20 02/13/20  Corena Herter, PA-C    Allergies    Dust mite extract  Review of Systems   Review of Systems  All other systems reviewed and are negative.   Physical Exam Updated Vital Signs BP (!) 124/58 (BP Location: Right Arm)   Pulse 77   Temp 100.2 F (37.9 C) (Oral)   Resp 16   Ht 5\' 3"  (1.6 m)   Wt 47.2 kg   LMP 01/30/2020 (Approximate)   SpO2 100%   BMI 18.42 kg/m   Physical Exam Vitals and nursing note reviewed. Exam conducted with a chaperone present.  Constitutional:      Appearance: Normal appearance. She is not toxic-appearing.  HENT:     Head: Normocephalic and  atraumatic.     Mouth/Throat:     Pharynx: Oropharynx is clear.     Comments: Patent oropharynx.  Multiple extractions noted throughout.  Large mass on hard palate, mildly squishy but without any erythema or obvious fluctuance.  Mildly tender to palpation.  No uvular deviation.  No floor of mouth induration or swelling.  No trismus.  Tolerating secretions well.  Multiple caries and poor dentition noted throughout.   Eyes:     General: No scleral icterus.    Conjunctiva/sclera: Conjunctivae normal.  Cardiovascular:     Rate and Rhythm: Normal rate.     Pulses: Normal pulses.  Pulmonary:     Effort: Pulmonary effort is normal. No respiratory distress.     Breath sounds: Normal breath sounds. No stridor. No wheezing.  Musculoskeletal:     Cervical back: Normal range of motion. No rigidity.  Skin:    General: Skin is dry.  Neurological:     Mental Status: She is alert and oriented to person, place, and time.     GCS: GCS eye subscore is 4. GCS verbal subscore is 5. GCS motor subscore is 6.  Psychiatric:        Mood and Affect: Mood normal.        Behavior: Behavior normal.        Thought Content: Thought content normal.        ED Results / Procedures / Treatments   Labs (all labs ordered are listed, but only abnormal results are displayed) Labs Reviewed  CBC WITH DIFFERENTIAL/PLATELET - Abnormal; Notable for the following components:      Result Value   WBC 24.4 (*)    Neutro Abs 21.8 (*)    Monocytes Absolute 1.3 (*)    Abs Immature Granulocytes 0.14 (*)    All other components within normal limits  COMPREHENSIVE METABOLIC PANEL - Abnormal; Notable for the following components:   Sodium 133 (*)    Potassium 3.3 (*)    Chloride 90 (*)    BUN 22 (*)    Creatinine, Ser 6.25 (*)    Calcium 8.4 (*)    Total Protein 8.5 (*)    GFR, Estimated 8 (*)    Anion gap 17 (*)    All other components within normal limits    EKG None  Radiology CT Soft Tissue Neck W  Contrast  Result Date: 02/08/2020 CLINICAL DATA:  Swelling of the roof of the mouth.  Dental pain. EXAM: CT NECK WITH CONTRAST TECHNIQUE: Multidetector CT imaging of the neck was performed using the standard protocol following the bolus administration of intravenous contrast. CONTRAST:  75mL OMNIPAQUE IOHEXOL 300 MG/ML  SOLN COMPARISON:  04/02/2007 FINDINGS: Pharynx and larynx: There is a fluid density collection at the roof of the mouth abutting the hard palate extending from front to back over a distance of 3.7 cm with a maximal cephalo caudal thickness of 16 mm. Internal air bubbles consistent with abscess. Presumably this has dissected into the roof of the mouth from patient's advanced dental and periodontal disease. Multiple teeth show caries and lucency around the roots this is most pronounced of the right maxillary molar which is most likely the site of origin based on the imaging. Salivary glands: Parotid and submandibular glands are normal. Thyroid: Normal Lymph nodes: No suppurative or dominant enlarged nodes. Mild reactive nodal prominence. Vascular: Normal Limited intracranial: Normal Visualized orbits: Normal Mastoids and visualized paranasal sinuses: Clear except for  mild mucosal thickening of the maxillary sinus floors. Skeleton: No sign of osteomyelitis of the hard palate. Extensive dental and periodontal disease as noted above. Upper chest: Normal Other: None IMPRESSION: There is a fluid density collection at the roof of the mouth abutting the hard palate extending from front to back over a distance of 3.7 cm with a maximal cephalo caudal thickness of 16 mm. Internal air bubbles consistent with abscess. Presumably this has dissected into the roof of the mouth from patient's advanced dental and periodontal disease. Multiple teeth show caries and lucency around the roots, most pronounced of the right maxillary molar which is most likely the site of origin based on the imaging. Electronically Signed    By: Nelson Chimes M.D.   On: 02/08/2020 19:45    Procedures .Marland KitchenIncision and Drainage  Date/Time: 02/08/2020 8:28 PM Performed by: Corena Herter, PA-C Authorized by: Corena Herter, PA-C   Consent:    Consent obtained:  Verbal   Consent given by:  Patient   Risks discussed:  Bleeding, incomplete drainage, pain and damage to other organs   Alternatives discussed:  No treatment Universal protocol:    Procedure explained and questions answered to patient or proxy's satisfaction: yes     Relevant documents present and verified: yes     Test results available and properly labeled: yes     Imaging studies available: yes     Required blood products, implants, devices, and special equipment available: yes     Site/side marked: yes     Immediately prior to procedure a time out was called: yes     Patient identity confirmed:  Verbally with patient Location:    Type:  Abscess   Location:  Mouth   Mouth location:  Palate Pre-procedure details:    Skin preparation:  Betadine Anesthesia (see MAR for exact dosages):    Anesthesia method:  Local infiltration   Local anesthetic:  Lidocaine 1% w/o epi Procedure type:    Complexity:  Complex Procedure details:    Incision types:  Stab incision   Incision depth:  Subcutaneous   Scalpel blade:  11   Wound management:  Probed and deloculated, irrigated with saline and extensive cleaning   Drainage:  Bloody   Drainage amount:  Moderate   Packing materials:  None Post-procedure details:    Patient tolerance of procedure:  Tolerated well, no immediate complications   (including critical care time)  Medications Ordered in ED Medications  clindamycin (CLEOCIN) capsule 450 mg (has no administration in time range)  traMADol (ULTRAM) tablet 50 mg (50 mg Oral Given 02/08/20 1238)  iohexol (OMNIPAQUE) 300 MG/ML solution 75 mL (75 mLs Intravenous Contrast Given 02/08/20 1825)  acetaminophen (TYLENOL) tablet 650 mg (650 mg Oral Given 02/08/20  2002)  potassium chloride SA (KLOR-CON) CR tablet 20 mEq (20 mEq Oral Given 02/08/20 2002)  lidocaine HCl (PF) (XYLOCAINE) 2 % injection 5 mL (5 mLs Intradermal Given 02/08/20 2033)    ED Course  I have reviewed the triage vital signs and the nursing notes.  Pertinent labs & imaging results that were available during my care of the patient were reviewed by me and considered in my medical decision making (see chart for details).  Clinical Course as of Feb 08 2055  Wed Feb 07, 5110  6160 44 year old female here with growth on the hard palate of her mouth probably for a few days.  Poor dentition.  Low-grade temperature here and elevated white count.  CT done.  Will attempt local I&D.   [MB]    Clinical Course User Index [MB] Hayden Rasmussen, MD   MDM Rules/Calculators/A&P                          She states that this is something that will typically "flare up" but has not changed in character.  This mass has been chronically persistent, but has been irritated in the past 24 hours.  She states that her pain had not been well controlled with Tylenol which prompted her to come to the ED for evaluation.  She denied any symptoms concerning for systemic illness.  Physical exam is notable for large mass suspicious for torus palantine versus cyst versus fibroma.  Lower suspicion for hard palate abscess given lack of fluctuance.    Spoke with Dr. Fredric Dine with [facility] who states that this can be followed on outpatient basis, but it appears to be rather atypical for torus palantine.  She recommends CT with contrast for evaluation of hard palate specifically.  Patient has been on dialysis for over 10 years.  Discussed with Dr. Roderic Palau and given chronicity of ESRD on HD, contrast study is not unreasonable.  Will obtain basic laboratory work-up, as well.    CBC notable for leukocytosis 24.4.  CMP largely unremarkable and consistent with her baseline end-stage renal disease.  Will replenish her mildly low  potassium with 20 mEq K-Dur.  Patient also developed a low-grade fever here in the ED, will treat with 650 mg Tylenol.  CT soft tissue neck is personally reviewed and demonstrates a fluid density collection at the roof of the mouth abutting the hard palate extending from front to back, approximately 3.7 cm in size.  Interval air bubbles consistent with abscess.  Suspected to be odontogenic in origin.  Multiple caries noted throughout.  She will need to follow-up with her dentist for ongoing evaluation management.  I have prescribed her clindamycin 450 mg 3 times daily for antibiotics.  I have also prescribed her a short course of tramadol which can take as needed for pain symptoms.  Encouraging her to check her temperature regularly and take Tylenol as needed for fever control.  I called Almyra Free, her legal guardian with [facility], but there was no answer.    Final Clinical Impression(s) / ED Diagnoses Final diagnoses:  Dental abscess    Rx / DC Orders ED Discharge Orders         Ordered    clindamycin (CLEOCIN) 150 MG capsule  3 times daily        02/08/20 2056    traMADol (ULTRAM) 50 MG tablet  2 times daily PRN        02/08/20 2056           Corena Herter, PA-C 02/08/20 2056    Milton Ferguson, MD 02/12/20 [phone]

## 2020-02-10 DIAGNOSIS — N186 End stage renal disease: Secondary | ICD-10-CM | POA: Diagnosis not present

## 2020-02-10 DIAGNOSIS — D631 Anemia in chronic kidney disease: Secondary | ICD-10-CM | POA: Diagnosis not present

## 2020-02-10 DIAGNOSIS — N2581 Secondary hyperparathyroidism of renal origin: Secondary | ICD-10-CM | POA: Diagnosis not present

## 2020-02-10 DIAGNOSIS — D509 Iron deficiency anemia, unspecified: Secondary | ICD-10-CM | POA: Diagnosis not present

## 2020-02-10 DIAGNOSIS — Z992 Dependence on renal dialysis: Secondary | ICD-10-CM | POA: Diagnosis not present

## 2020-02-10 DIAGNOSIS — E559 Vitamin D deficiency, unspecified: Secondary | ICD-10-CM | POA: Diagnosis not present

## 2020-02-13 DIAGNOSIS — D509 Iron deficiency anemia, unspecified: Secondary | ICD-10-CM | POA: Diagnosis not present

## 2020-02-13 DIAGNOSIS — N186 End stage renal disease: Secondary | ICD-10-CM | POA: Diagnosis not present

## 2020-02-13 DIAGNOSIS — E559 Vitamin D deficiency, unspecified: Secondary | ICD-10-CM | POA: Diagnosis not present

## 2020-02-13 DIAGNOSIS — D631 Anemia in chronic kidney disease: Secondary | ICD-10-CM | POA: Diagnosis not present

## 2020-02-13 DIAGNOSIS — N2581 Secondary hyperparathyroidism of renal origin: Secondary | ICD-10-CM | POA: Diagnosis not present

## 2020-02-13 DIAGNOSIS — Z992 Dependence on renal dialysis: Secondary | ICD-10-CM | POA: Diagnosis not present

## 2020-02-14 DIAGNOSIS — Z992 Dependence on renal dialysis: Secondary | ICD-10-CM | POA: Diagnosis not present

## 2020-02-14 DIAGNOSIS — N186 End stage renal disease: Secondary | ICD-10-CM | POA: Diagnosis not present

## 2020-02-15 DIAGNOSIS — D631 Anemia in chronic kidney disease: Secondary | ICD-10-CM | POA: Diagnosis not present

## 2020-02-15 DIAGNOSIS — Z992 Dependence on renal dialysis: Secondary | ICD-10-CM | POA: Diagnosis not present

## 2020-02-15 DIAGNOSIS — D509 Iron deficiency anemia, unspecified: Secondary | ICD-10-CM | POA: Diagnosis not present

## 2020-02-15 DIAGNOSIS — E559 Vitamin D deficiency, unspecified: Secondary | ICD-10-CM | POA: Diagnosis not present

## 2020-02-15 DIAGNOSIS — N186 End stage renal disease: Secondary | ICD-10-CM | POA: Diagnosis not present

## 2020-02-15 DIAGNOSIS — N2581 Secondary hyperparathyroidism of renal origin: Secondary | ICD-10-CM | POA: Diagnosis not present

## 2020-02-17 DIAGNOSIS — D631 Anemia in chronic kidney disease: Secondary | ICD-10-CM | POA: Diagnosis not present

## 2020-02-17 DIAGNOSIS — N2581 Secondary hyperparathyroidism of renal origin: Secondary | ICD-10-CM | POA: Diagnosis not present

## 2020-02-17 DIAGNOSIS — D509 Iron deficiency anemia, unspecified: Secondary | ICD-10-CM | POA: Diagnosis not present

## 2020-02-17 DIAGNOSIS — N186 End stage renal disease: Secondary | ICD-10-CM | POA: Diagnosis not present

## 2020-02-17 DIAGNOSIS — E559 Vitamin D deficiency, unspecified: Secondary | ICD-10-CM | POA: Diagnosis not present

## 2020-02-17 DIAGNOSIS — Z992 Dependence on renal dialysis: Secondary | ICD-10-CM | POA: Diagnosis not present

## 2020-02-20 DIAGNOSIS — Z992 Dependence on renal dialysis: Secondary | ICD-10-CM | POA: Diagnosis not present

## 2020-02-20 DIAGNOSIS — D631 Anemia in chronic kidney disease: Secondary | ICD-10-CM | POA: Diagnosis not present

## 2020-02-20 DIAGNOSIS — F17290 Nicotine dependence, other tobacco product, uncomplicated: Secondary | ICD-10-CM | POA: Diagnosis not present

## 2020-02-20 DIAGNOSIS — D509 Iron deficiency anemia, unspecified: Secondary | ICD-10-CM | POA: Diagnosis not present

## 2020-02-20 DIAGNOSIS — E559 Vitamin D deficiency, unspecified: Secondary | ICD-10-CM | POA: Diagnosis not present

## 2020-02-20 DIAGNOSIS — N2581 Secondary hyperparathyroidism of renal origin: Secondary | ICD-10-CM | POA: Diagnosis not present

## 2020-02-20 DIAGNOSIS — M329 Systemic lupus erythematosus, unspecified: Secondary | ICD-10-CM | POA: Diagnosis not present

## 2020-02-20 DIAGNOSIS — N186 End stage renal disease: Secondary | ICD-10-CM | POA: Diagnosis not present

## 2020-02-20 DIAGNOSIS — I959 Hypotension, unspecified: Secondary | ICD-10-CM | POA: Diagnosis not present

## 2020-02-22 DIAGNOSIS — E559 Vitamin D deficiency, unspecified: Secondary | ICD-10-CM | POA: Diagnosis not present

## 2020-02-22 DIAGNOSIS — N186 End stage renal disease: Secondary | ICD-10-CM | POA: Diagnosis not present

## 2020-02-22 DIAGNOSIS — D631 Anemia in chronic kidney disease: Secondary | ICD-10-CM | POA: Diagnosis not present

## 2020-02-22 DIAGNOSIS — Z992 Dependence on renal dialysis: Secondary | ICD-10-CM | POA: Diagnosis not present

## 2020-02-22 DIAGNOSIS — N2581 Secondary hyperparathyroidism of renal origin: Secondary | ICD-10-CM | POA: Diagnosis not present

## 2020-02-22 DIAGNOSIS — D509 Iron deficiency anemia, unspecified: Secondary | ICD-10-CM | POA: Diagnosis not present

## 2020-02-24 DIAGNOSIS — E559 Vitamin D deficiency, unspecified: Secondary | ICD-10-CM | POA: Diagnosis not present

## 2020-02-24 DIAGNOSIS — Z992 Dependence on renal dialysis: Secondary | ICD-10-CM | POA: Diagnosis not present

## 2020-02-24 DIAGNOSIS — D631 Anemia in chronic kidney disease: Secondary | ICD-10-CM | POA: Diagnosis not present

## 2020-02-24 DIAGNOSIS — N2581 Secondary hyperparathyroidism of renal origin: Secondary | ICD-10-CM | POA: Diagnosis not present

## 2020-02-24 DIAGNOSIS — N186 End stage renal disease: Secondary | ICD-10-CM | POA: Diagnosis not present

## 2020-02-24 DIAGNOSIS — D509 Iron deficiency anemia, unspecified: Secondary | ICD-10-CM | POA: Diagnosis not present

## 2020-02-27 DIAGNOSIS — D631 Anemia in chronic kidney disease: Secondary | ICD-10-CM | POA: Diagnosis not present

## 2020-02-27 DIAGNOSIS — D509 Iron deficiency anemia, unspecified: Secondary | ICD-10-CM | POA: Diagnosis not present

## 2020-02-27 DIAGNOSIS — N186 End stage renal disease: Secondary | ICD-10-CM | POA: Diagnosis not present

## 2020-02-27 DIAGNOSIS — N2581 Secondary hyperparathyroidism of renal origin: Secondary | ICD-10-CM | POA: Diagnosis not present

## 2020-02-27 DIAGNOSIS — E559 Vitamin D deficiency, unspecified: Secondary | ICD-10-CM | POA: Diagnosis not present

## 2020-02-27 DIAGNOSIS — Z992 Dependence on renal dialysis: Secondary | ICD-10-CM | POA: Diagnosis not present

## 2020-02-29 DIAGNOSIS — N186 End stage renal disease: Secondary | ICD-10-CM | POA: Diagnosis not present

## 2020-02-29 DIAGNOSIS — Z992 Dependence on renal dialysis: Secondary | ICD-10-CM | POA: Diagnosis not present

## 2020-02-29 DIAGNOSIS — N2581 Secondary hyperparathyroidism of renal origin: Secondary | ICD-10-CM | POA: Diagnosis not present

## 2020-02-29 DIAGNOSIS — E559 Vitamin D deficiency, unspecified: Secondary | ICD-10-CM | POA: Diagnosis not present

## 2020-02-29 DIAGNOSIS — D631 Anemia in chronic kidney disease: Secondary | ICD-10-CM | POA: Diagnosis not present

## 2020-02-29 DIAGNOSIS — D509 Iron deficiency anemia, unspecified: Secondary | ICD-10-CM | POA: Diagnosis not present

## 2020-03-02 DIAGNOSIS — N2581 Secondary hyperparathyroidism of renal origin: Secondary | ICD-10-CM | POA: Diagnosis not present

## 2020-03-02 DIAGNOSIS — E559 Vitamin D deficiency, unspecified: Secondary | ICD-10-CM | POA: Diagnosis not present

## 2020-03-02 DIAGNOSIS — D509 Iron deficiency anemia, unspecified: Secondary | ICD-10-CM | POA: Diagnosis not present

## 2020-03-02 DIAGNOSIS — Z992 Dependence on renal dialysis: Secondary | ICD-10-CM | POA: Diagnosis not present

## 2020-03-02 DIAGNOSIS — D631 Anemia in chronic kidney disease: Secondary | ICD-10-CM | POA: Diagnosis not present

## 2020-03-02 DIAGNOSIS — N186 End stage renal disease: Secondary | ICD-10-CM | POA: Diagnosis not present

## 2020-03-05 DIAGNOSIS — D509 Iron deficiency anemia, unspecified: Secondary | ICD-10-CM | POA: Diagnosis not present

## 2020-03-05 DIAGNOSIS — E559 Vitamin D deficiency, unspecified: Secondary | ICD-10-CM | POA: Diagnosis not present

## 2020-03-05 DIAGNOSIS — Z992 Dependence on renal dialysis: Secondary | ICD-10-CM | POA: Diagnosis not present

## 2020-03-05 DIAGNOSIS — D631 Anemia in chronic kidney disease: Secondary | ICD-10-CM | POA: Diagnosis not present

## 2020-03-05 DIAGNOSIS — N186 End stage renal disease: Secondary | ICD-10-CM | POA: Diagnosis not present

## 2020-03-05 DIAGNOSIS — N2581 Secondary hyperparathyroidism of renal origin: Secondary | ICD-10-CM | POA: Diagnosis not present

## 2020-03-07 DIAGNOSIS — D631 Anemia in chronic kidney disease: Secondary | ICD-10-CM | POA: Diagnosis not present

## 2020-03-07 DIAGNOSIS — E559 Vitamin D deficiency, unspecified: Secondary | ICD-10-CM | POA: Diagnosis not present

## 2020-03-07 DIAGNOSIS — N186 End stage renal disease: Secondary | ICD-10-CM | POA: Diagnosis not present

## 2020-03-07 DIAGNOSIS — F419 Anxiety disorder, unspecified: Secondary | ICD-10-CM | POA: Diagnosis not present

## 2020-03-07 DIAGNOSIS — Z992 Dependence on renal dialysis: Secondary | ICD-10-CM | POA: Diagnosis not present

## 2020-03-07 DIAGNOSIS — N2581 Secondary hyperparathyroidism of renal origin: Secondary | ICD-10-CM | POA: Diagnosis not present

## 2020-03-07 DIAGNOSIS — F329 Major depressive disorder, single episode, unspecified: Secondary | ICD-10-CM | POA: Diagnosis not present

## 2020-03-07 DIAGNOSIS — D509 Iron deficiency anemia, unspecified: Secondary | ICD-10-CM | POA: Diagnosis not present

## 2020-03-09 DIAGNOSIS — D509 Iron deficiency anemia, unspecified: Secondary | ICD-10-CM | POA: Diagnosis not present

## 2020-03-09 DIAGNOSIS — D631 Anemia in chronic kidney disease: Secondary | ICD-10-CM | POA: Diagnosis not present

## 2020-03-09 DIAGNOSIS — E559 Vitamin D deficiency, unspecified: Secondary | ICD-10-CM | POA: Diagnosis not present

## 2020-03-09 DIAGNOSIS — Z992 Dependence on renal dialysis: Secondary | ICD-10-CM | POA: Diagnosis not present

## 2020-03-09 DIAGNOSIS — N2581 Secondary hyperparathyroidism of renal origin: Secondary | ICD-10-CM | POA: Diagnosis not present

## 2020-03-09 DIAGNOSIS — N186 End stage renal disease: Secondary | ICD-10-CM | POA: Diagnosis not present

## 2020-03-12 DIAGNOSIS — D509 Iron deficiency anemia, unspecified: Secondary | ICD-10-CM | POA: Diagnosis not present

## 2020-03-12 DIAGNOSIS — N186 End stage renal disease: Secondary | ICD-10-CM | POA: Diagnosis not present

## 2020-03-12 DIAGNOSIS — D631 Anemia in chronic kidney disease: Secondary | ICD-10-CM | POA: Diagnosis not present

## 2020-03-12 DIAGNOSIS — N2581 Secondary hyperparathyroidism of renal origin: Secondary | ICD-10-CM | POA: Diagnosis not present

## 2020-03-12 DIAGNOSIS — Z992 Dependence on renal dialysis: Secondary | ICD-10-CM | POA: Diagnosis not present

## 2020-03-12 DIAGNOSIS — E559 Vitamin D deficiency, unspecified: Secondary | ICD-10-CM | POA: Diagnosis not present

## 2020-03-14 DIAGNOSIS — E559 Vitamin D deficiency, unspecified: Secondary | ICD-10-CM | POA: Diagnosis not present

## 2020-03-14 DIAGNOSIS — N2581 Secondary hyperparathyroidism of renal origin: Secondary | ICD-10-CM | POA: Diagnosis not present

## 2020-03-14 DIAGNOSIS — Z992 Dependence on renal dialysis: Secondary | ICD-10-CM | POA: Diagnosis not present

## 2020-03-14 DIAGNOSIS — D509 Iron deficiency anemia, unspecified: Secondary | ICD-10-CM | POA: Diagnosis not present

## 2020-03-14 DIAGNOSIS — N186 End stage renal disease: Secondary | ICD-10-CM | POA: Diagnosis not present

## 2020-03-14 DIAGNOSIS — D631 Anemia in chronic kidney disease: Secondary | ICD-10-CM | POA: Diagnosis not present

## 2020-03-16 DIAGNOSIS — Z992 Dependence on renal dialysis: Secondary | ICD-10-CM | POA: Diagnosis not present

## 2020-03-16 DIAGNOSIS — D631 Anemia in chronic kidney disease: Secondary | ICD-10-CM | POA: Diagnosis not present

## 2020-03-16 DIAGNOSIS — N2581 Secondary hyperparathyroidism of renal origin: Secondary | ICD-10-CM | POA: Diagnosis not present

## 2020-03-16 DIAGNOSIS — D509 Iron deficiency anemia, unspecified: Secondary | ICD-10-CM | POA: Diagnosis not present

## 2020-03-16 DIAGNOSIS — N186 End stage renal disease: Secondary | ICD-10-CM | POA: Diagnosis not present

## 2020-03-16 DIAGNOSIS — E559 Vitamin D deficiency, unspecified: Secondary | ICD-10-CM | POA: Diagnosis not present

## 2020-03-20 DIAGNOSIS — N186 End stage renal disease: Secondary | ICD-10-CM | POA: Diagnosis not present

## 2020-03-20 DIAGNOSIS — Z992 Dependence on renal dialysis: Secondary | ICD-10-CM | POA: Diagnosis not present

## 2020-03-20 DIAGNOSIS — D631 Anemia in chronic kidney disease: Secondary | ICD-10-CM | POA: Diagnosis not present

## 2020-03-20 DIAGNOSIS — D509 Iron deficiency anemia, unspecified: Secondary | ICD-10-CM | POA: Diagnosis not present

## 2020-03-20 DIAGNOSIS — E559 Vitamin D deficiency, unspecified: Secondary | ICD-10-CM | POA: Diagnosis not present

## 2020-03-20 DIAGNOSIS — N2581 Secondary hyperparathyroidism of renal origin: Secondary | ICD-10-CM | POA: Diagnosis not present

## 2020-03-21 DIAGNOSIS — N2581 Secondary hyperparathyroidism of renal origin: Secondary | ICD-10-CM | POA: Diagnosis not present

## 2020-03-21 DIAGNOSIS — N186 End stage renal disease: Secondary | ICD-10-CM | POA: Diagnosis not present

## 2020-03-21 DIAGNOSIS — Z992 Dependence on renal dialysis: Secondary | ICD-10-CM | POA: Diagnosis not present

## 2020-03-21 DIAGNOSIS — E559 Vitamin D deficiency, unspecified: Secondary | ICD-10-CM | POA: Diagnosis not present

## 2020-03-21 DIAGNOSIS — D509 Iron deficiency anemia, unspecified: Secondary | ICD-10-CM | POA: Diagnosis not present

## 2020-03-21 DIAGNOSIS — D631 Anemia in chronic kidney disease: Secondary | ICD-10-CM | POA: Diagnosis not present

## 2020-03-23 DIAGNOSIS — D509 Iron deficiency anemia, unspecified: Secondary | ICD-10-CM | POA: Diagnosis not present

## 2020-03-23 DIAGNOSIS — N2581 Secondary hyperparathyroidism of renal origin: Secondary | ICD-10-CM | POA: Diagnosis not present

## 2020-03-23 DIAGNOSIS — Z992 Dependence on renal dialysis: Secondary | ICD-10-CM | POA: Diagnosis not present

## 2020-03-23 DIAGNOSIS — E559 Vitamin D deficiency, unspecified: Secondary | ICD-10-CM | POA: Diagnosis not present

## 2020-03-23 DIAGNOSIS — D631 Anemia in chronic kidney disease: Secondary | ICD-10-CM | POA: Diagnosis not present

## 2020-03-23 DIAGNOSIS — N186 End stage renal disease: Secondary | ICD-10-CM | POA: Diagnosis not present

## 2020-03-26 DIAGNOSIS — D631 Anemia in chronic kidney disease: Secondary | ICD-10-CM | POA: Diagnosis not present

## 2020-03-26 DIAGNOSIS — E559 Vitamin D deficiency, unspecified: Secondary | ICD-10-CM | POA: Diagnosis not present

## 2020-03-26 DIAGNOSIS — E119 Type 2 diabetes mellitus without complications: Secondary | ICD-10-CM | POA: Diagnosis not present

## 2020-03-26 DIAGNOSIS — N186 End stage renal disease: Secondary | ICD-10-CM | POA: Diagnosis not present

## 2020-03-26 DIAGNOSIS — N2581 Secondary hyperparathyroidism of renal origin: Secondary | ICD-10-CM | POA: Diagnosis not present

## 2020-03-26 DIAGNOSIS — D509 Iron deficiency anemia, unspecified: Secondary | ICD-10-CM | POA: Diagnosis not present

## 2020-03-26 DIAGNOSIS — Z992 Dependence on renal dialysis: Secondary | ICD-10-CM | POA: Diagnosis not present

## 2020-03-28 DIAGNOSIS — D631 Anemia in chronic kidney disease: Secondary | ICD-10-CM | POA: Diagnosis not present

## 2020-03-28 DIAGNOSIS — E559 Vitamin D deficiency, unspecified: Secondary | ICD-10-CM | POA: Diagnosis not present

## 2020-03-28 DIAGNOSIS — D509 Iron deficiency anemia, unspecified: Secondary | ICD-10-CM | POA: Diagnosis not present

## 2020-03-28 DIAGNOSIS — Z992 Dependence on renal dialysis: Secondary | ICD-10-CM | POA: Diagnosis not present

## 2020-03-28 DIAGNOSIS — N2581 Secondary hyperparathyroidism of renal origin: Secondary | ICD-10-CM | POA: Diagnosis not present

## 2020-03-28 DIAGNOSIS — N186 End stage renal disease: Secondary | ICD-10-CM | POA: Diagnosis not present

## 2020-03-30 DIAGNOSIS — D509 Iron deficiency anemia, unspecified: Secondary | ICD-10-CM | POA: Diagnosis not present

## 2020-03-30 DIAGNOSIS — N186 End stage renal disease: Secondary | ICD-10-CM | POA: Diagnosis not present

## 2020-03-30 DIAGNOSIS — D631 Anemia in chronic kidney disease: Secondary | ICD-10-CM | POA: Diagnosis not present

## 2020-03-30 DIAGNOSIS — Z992 Dependence on renal dialysis: Secondary | ICD-10-CM | POA: Diagnosis not present

## 2020-03-30 DIAGNOSIS — E559 Vitamin D deficiency, unspecified: Secondary | ICD-10-CM | POA: Diagnosis not present

## 2020-03-30 DIAGNOSIS — N2581 Secondary hyperparathyroidism of renal origin: Secondary | ICD-10-CM | POA: Diagnosis not present

## 2020-04-04 DIAGNOSIS — D631 Anemia in chronic kidney disease: Secondary | ICD-10-CM | POA: Diagnosis not present

## 2020-04-04 DIAGNOSIS — N186 End stage renal disease: Secondary | ICD-10-CM | POA: Diagnosis not present

## 2020-04-04 DIAGNOSIS — E559 Vitamin D deficiency, unspecified: Secondary | ICD-10-CM | POA: Diagnosis not present

## 2020-04-04 DIAGNOSIS — D509 Iron deficiency anemia, unspecified: Secondary | ICD-10-CM | POA: Diagnosis not present

## 2020-04-04 DIAGNOSIS — N2581 Secondary hyperparathyroidism of renal origin: Secondary | ICD-10-CM | POA: Diagnosis not present

## 2020-04-04 DIAGNOSIS — Z992 Dependence on renal dialysis: Secondary | ICD-10-CM | POA: Diagnosis not present

## 2020-04-06 DIAGNOSIS — N2581 Secondary hyperparathyroidism of renal origin: Secondary | ICD-10-CM | POA: Diagnosis not present

## 2020-04-06 DIAGNOSIS — N186 End stage renal disease: Secondary | ICD-10-CM | POA: Diagnosis not present

## 2020-04-06 DIAGNOSIS — E559 Vitamin D deficiency, unspecified: Secondary | ICD-10-CM | POA: Diagnosis not present

## 2020-04-06 DIAGNOSIS — D631 Anemia in chronic kidney disease: Secondary | ICD-10-CM | POA: Diagnosis not present

## 2020-04-06 DIAGNOSIS — D509 Iron deficiency anemia, unspecified: Secondary | ICD-10-CM | POA: Diagnosis not present

## 2020-04-06 DIAGNOSIS — Z992 Dependence on renal dialysis: Secondary | ICD-10-CM | POA: Diagnosis not present

## 2020-04-09 DIAGNOSIS — N186 End stage renal disease: Secondary | ICD-10-CM | POA: Diagnosis not present

## 2020-04-09 DIAGNOSIS — N2581 Secondary hyperparathyroidism of renal origin: Secondary | ICD-10-CM | POA: Diagnosis not present

## 2020-04-09 DIAGNOSIS — D631 Anemia in chronic kidney disease: Secondary | ICD-10-CM | POA: Diagnosis not present

## 2020-04-09 DIAGNOSIS — Z992 Dependence on renal dialysis: Secondary | ICD-10-CM | POA: Diagnosis not present

## 2020-04-09 DIAGNOSIS — D509 Iron deficiency anemia, unspecified: Secondary | ICD-10-CM | POA: Diagnosis not present

## 2020-04-09 DIAGNOSIS — E559 Vitamin D deficiency, unspecified: Secondary | ICD-10-CM | POA: Diagnosis not present

## 2020-04-10 DIAGNOSIS — Z23 Encounter for immunization: Secondary | ICD-10-CM | POA: Diagnosis not present

## 2020-04-11 DIAGNOSIS — D631 Anemia in chronic kidney disease: Secondary | ICD-10-CM | POA: Diagnosis not present

## 2020-04-11 DIAGNOSIS — F419 Anxiety disorder, unspecified: Secondary | ICD-10-CM | POA: Diagnosis not present

## 2020-04-11 DIAGNOSIS — E559 Vitamin D deficiency, unspecified: Secondary | ICD-10-CM | POA: Diagnosis not present

## 2020-04-11 DIAGNOSIS — N2581 Secondary hyperparathyroidism of renal origin: Secondary | ICD-10-CM | POA: Diagnosis not present

## 2020-04-11 DIAGNOSIS — D509 Iron deficiency anemia, unspecified: Secondary | ICD-10-CM | POA: Diagnosis not present

## 2020-04-11 DIAGNOSIS — N186 End stage renal disease: Secondary | ICD-10-CM | POA: Diagnosis not present

## 2020-04-11 DIAGNOSIS — F329 Major depressive disorder, single episode, unspecified: Secondary | ICD-10-CM | POA: Diagnosis not present

## 2020-04-11 DIAGNOSIS — Z992 Dependence on renal dialysis: Secondary | ICD-10-CM | POA: Diagnosis not present

## 2020-04-13 DIAGNOSIS — Z992 Dependence on renal dialysis: Secondary | ICD-10-CM | POA: Diagnosis not present

## 2020-04-13 DIAGNOSIS — D509 Iron deficiency anemia, unspecified: Secondary | ICD-10-CM | POA: Diagnosis not present

## 2020-04-13 DIAGNOSIS — N186 End stage renal disease: Secondary | ICD-10-CM | POA: Diagnosis not present

## 2020-04-13 DIAGNOSIS — N2581 Secondary hyperparathyroidism of renal origin: Secondary | ICD-10-CM | POA: Diagnosis not present

## 2020-04-13 DIAGNOSIS — D631 Anemia in chronic kidney disease: Secondary | ICD-10-CM | POA: Diagnosis not present

## 2020-04-13 DIAGNOSIS — E559 Vitamin D deficiency, unspecified: Secondary | ICD-10-CM | POA: Diagnosis not present

## 2020-04-16 DIAGNOSIS — N186 End stage renal disease: Secondary | ICD-10-CM | POA: Diagnosis not present

## 2020-04-16 DIAGNOSIS — D631 Anemia in chronic kidney disease: Secondary | ICD-10-CM | POA: Diagnosis not present

## 2020-04-16 DIAGNOSIS — D509 Iron deficiency anemia, unspecified: Secondary | ICD-10-CM | POA: Diagnosis not present

## 2020-04-16 DIAGNOSIS — Z992 Dependence on renal dialysis: Secondary | ICD-10-CM | POA: Diagnosis not present

## 2020-04-16 DIAGNOSIS — E559 Vitamin D deficiency, unspecified: Secondary | ICD-10-CM | POA: Diagnosis not present

## 2020-04-16 DIAGNOSIS — N2581 Secondary hyperparathyroidism of renal origin: Secondary | ICD-10-CM | POA: Diagnosis not present

## 2020-04-18 DIAGNOSIS — D509 Iron deficiency anemia, unspecified: Secondary | ICD-10-CM | POA: Diagnosis not present

## 2020-04-18 DIAGNOSIS — E559 Vitamin D deficiency, unspecified: Secondary | ICD-10-CM | POA: Diagnosis not present

## 2020-04-18 DIAGNOSIS — Z992 Dependence on renal dialysis: Secondary | ICD-10-CM | POA: Diagnosis not present

## 2020-04-18 DIAGNOSIS — N186 End stage renal disease: Secondary | ICD-10-CM | POA: Diagnosis not present

## 2020-04-18 DIAGNOSIS — N2581 Secondary hyperparathyroidism of renal origin: Secondary | ICD-10-CM | POA: Diagnosis not present

## 2020-04-18 DIAGNOSIS — D631 Anemia in chronic kidney disease: Secondary | ICD-10-CM | POA: Diagnosis not present

## 2020-04-20 DIAGNOSIS — Z992 Dependence on renal dialysis: Secondary | ICD-10-CM | POA: Diagnosis not present

## 2020-04-20 DIAGNOSIS — D509 Iron deficiency anemia, unspecified: Secondary | ICD-10-CM | POA: Diagnosis not present

## 2020-04-20 DIAGNOSIS — N2581 Secondary hyperparathyroidism of renal origin: Secondary | ICD-10-CM | POA: Diagnosis not present

## 2020-04-20 DIAGNOSIS — E559 Vitamin D deficiency, unspecified: Secondary | ICD-10-CM | POA: Diagnosis not present

## 2020-04-20 DIAGNOSIS — N186 End stage renal disease: Secondary | ICD-10-CM | POA: Diagnosis not present

## 2020-04-20 DIAGNOSIS — D631 Anemia in chronic kidney disease: Secondary | ICD-10-CM | POA: Diagnosis not present

## 2020-04-23 DIAGNOSIS — N2581 Secondary hyperparathyroidism of renal origin: Secondary | ICD-10-CM | POA: Diagnosis not present

## 2020-04-23 DIAGNOSIS — Z992 Dependence on renal dialysis: Secondary | ICD-10-CM | POA: Diagnosis not present

## 2020-04-23 DIAGNOSIS — N186 End stage renal disease: Secondary | ICD-10-CM | POA: Diagnosis not present

## 2020-04-23 DIAGNOSIS — D631 Anemia in chronic kidney disease: Secondary | ICD-10-CM | POA: Diagnosis not present

## 2020-04-23 DIAGNOSIS — E559 Vitamin D deficiency, unspecified: Secondary | ICD-10-CM | POA: Diagnosis not present

## 2020-04-23 DIAGNOSIS — D509 Iron deficiency anemia, unspecified: Secondary | ICD-10-CM | POA: Diagnosis not present

## 2020-04-25 DIAGNOSIS — N2581 Secondary hyperparathyroidism of renal origin: Secondary | ICD-10-CM | POA: Diagnosis not present

## 2020-04-25 DIAGNOSIS — D631 Anemia in chronic kidney disease: Secondary | ICD-10-CM | POA: Diagnosis not present

## 2020-04-25 DIAGNOSIS — Z992 Dependence on renal dialysis: Secondary | ICD-10-CM | POA: Diagnosis not present

## 2020-04-25 DIAGNOSIS — D509 Iron deficiency anemia, unspecified: Secondary | ICD-10-CM | POA: Diagnosis not present

## 2020-04-25 DIAGNOSIS — E559 Vitamin D deficiency, unspecified: Secondary | ICD-10-CM | POA: Diagnosis not present

## 2020-04-25 DIAGNOSIS — N186 End stage renal disease: Secondary | ICD-10-CM | POA: Diagnosis not present

## 2020-04-27 DIAGNOSIS — Z992 Dependence on renal dialysis: Secondary | ICD-10-CM | POA: Diagnosis not present

## 2020-04-27 DIAGNOSIS — N186 End stage renal disease: Secondary | ICD-10-CM | POA: Diagnosis not present

## 2020-04-27 DIAGNOSIS — D631 Anemia in chronic kidney disease: Secondary | ICD-10-CM | POA: Diagnosis not present

## 2020-04-27 DIAGNOSIS — E559 Vitamin D deficiency, unspecified: Secondary | ICD-10-CM | POA: Diagnosis not present

## 2020-04-27 DIAGNOSIS — N2581 Secondary hyperparathyroidism of renal origin: Secondary | ICD-10-CM | POA: Diagnosis not present

## 2020-04-27 DIAGNOSIS — D509 Iron deficiency anemia, unspecified: Secondary | ICD-10-CM | POA: Diagnosis not present

## 2020-04-30 DIAGNOSIS — N186 End stage renal disease: Secondary | ICD-10-CM | POA: Diagnosis not present

## 2020-04-30 DIAGNOSIS — D509 Iron deficiency anemia, unspecified: Secondary | ICD-10-CM | POA: Diagnosis not present

## 2020-04-30 DIAGNOSIS — N2581 Secondary hyperparathyroidism of renal origin: Secondary | ICD-10-CM | POA: Diagnosis not present

## 2020-04-30 DIAGNOSIS — E559 Vitamin D deficiency, unspecified: Secondary | ICD-10-CM | POA: Diagnosis not present

## 2020-04-30 DIAGNOSIS — Z992 Dependence on renal dialysis: Secondary | ICD-10-CM | POA: Diagnosis not present

## 2020-04-30 DIAGNOSIS — D631 Anemia in chronic kidney disease: Secondary | ICD-10-CM | POA: Diagnosis not present

## 2020-05-02 DIAGNOSIS — N2581 Secondary hyperparathyroidism of renal origin: Secondary | ICD-10-CM | POA: Diagnosis not present

## 2020-05-02 DIAGNOSIS — D631 Anemia in chronic kidney disease: Secondary | ICD-10-CM | POA: Diagnosis not present

## 2020-05-02 DIAGNOSIS — N186 End stage renal disease: Secondary | ICD-10-CM | POA: Diagnosis not present

## 2020-05-02 DIAGNOSIS — Z992 Dependence on renal dialysis: Secondary | ICD-10-CM | POA: Diagnosis not present

## 2020-05-02 DIAGNOSIS — D509 Iron deficiency anemia, unspecified: Secondary | ICD-10-CM | POA: Diagnosis not present

## 2020-05-02 DIAGNOSIS — E559 Vitamin D deficiency, unspecified: Secondary | ICD-10-CM | POA: Diagnosis not present

## 2020-05-04 DIAGNOSIS — N2581 Secondary hyperparathyroidism of renal origin: Secondary | ICD-10-CM | POA: Diagnosis not present

## 2020-05-04 DIAGNOSIS — Z992 Dependence on renal dialysis: Secondary | ICD-10-CM | POA: Diagnosis not present

## 2020-05-04 DIAGNOSIS — D631 Anemia in chronic kidney disease: Secondary | ICD-10-CM | POA: Diagnosis not present

## 2020-05-04 DIAGNOSIS — E559 Vitamin D deficiency, unspecified: Secondary | ICD-10-CM | POA: Diagnosis not present

## 2020-05-04 DIAGNOSIS — N186 End stage renal disease: Secondary | ICD-10-CM | POA: Diagnosis not present

## 2020-05-04 DIAGNOSIS — D509 Iron deficiency anemia, unspecified: Secondary | ICD-10-CM | POA: Diagnosis not present

## 2020-05-07 DIAGNOSIS — D631 Anemia in chronic kidney disease: Secondary | ICD-10-CM | POA: Diagnosis not present

## 2020-05-07 DIAGNOSIS — N2581 Secondary hyperparathyroidism of renal origin: Secondary | ICD-10-CM | POA: Diagnosis not present

## 2020-05-07 DIAGNOSIS — E559 Vitamin D deficiency, unspecified: Secondary | ICD-10-CM | POA: Diagnosis not present

## 2020-05-07 DIAGNOSIS — N186 End stage renal disease: Secondary | ICD-10-CM | POA: Diagnosis not present

## 2020-05-07 DIAGNOSIS — Z992 Dependence on renal dialysis: Secondary | ICD-10-CM | POA: Diagnosis not present

## 2020-05-07 DIAGNOSIS — D509 Iron deficiency anemia, unspecified: Secondary | ICD-10-CM | POA: Diagnosis not present

## 2020-05-09 DIAGNOSIS — D631 Anemia in chronic kidney disease: Secondary | ICD-10-CM | POA: Diagnosis not present

## 2020-05-09 DIAGNOSIS — N186 End stage renal disease: Secondary | ICD-10-CM | POA: Diagnosis not present

## 2020-05-09 DIAGNOSIS — N2581 Secondary hyperparathyroidism of renal origin: Secondary | ICD-10-CM | POA: Diagnosis not present

## 2020-05-09 DIAGNOSIS — E559 Vitamin D deficiency, unspecified: Secondary | ICD-10-CM | POA: Diagnosis not present

## 2020-05-09 DIAGNOSIS — D509 Iron deficiency anemia, unspecified: Secondary | ICD-10-CM | POA: Diagnosis not present

## 2020-05-09 DIAGNOSIS — Z992 Dependence on renal dialysis: Secondary | ICD-10-CM | POA: Diagnosis not present

## 2020-05-10 DIAGNOSIS — F419 Anxiety disorder, unspecified: Secondary | ICD-10-CM | POA: Diagnosis not present

## 2020-05-10 DIAGNOSIS — F329 Major depressive disorder, single episode, unspecified: Secondary | ICD-10-CM | POA: Diagnosis not present

## 2020-05-11 DIAGNOSIS — D509 Iron deficiency anemia, unspecified: Secondary | ICD-10-CM | POA: Diagnosis not present

## 2020-05-11 DIAGNOSIS — Z992 Dependence on renal dialysis: Secondary | ICD-10-CM | POA: Diagnosis not present

## 2020-05-11 DIAGNOSIS — N2581 Secondary hyperparathyroidism of renal origin: Secondary | ICD-10-CM | POA: Diagnosis not present

## 2020-05-11 DIAGNOSIS — D631 Anemia in chronic kidney disease: Secondary | ICD-10-CM | POA: Diagnosis not present

## 2020-05-11 DIAGNOSIS — E559 Vitamin D deficiency, unspecified: Secondary | ICD-10-CM | POA: Diagnosis not present

## 2020-05-11 DIAGNOSIS — N186 End stage renal disease: Secondary | ICD-10-CM | POA: Diagnosis not present

## 2020-05-14 DIAGNOSIS — D509 Iron deficiency anemia, unspecified: Secondary | ICD-10-CM | POA: Diagnosis not present

## 2020-05-14 DIAGNOSIS — Z992 Dependence on renal dialysis: Secondary | ICD-10-CM | POA: Diagnosis not present

## 2020-05-14 DIAGNOSIS — N186 End stage renal disease: Secondary | ICD-10-CM | POA: Diagnosis not present

## 2020-05-14 DIAGNOSIS — N2581 Secondary hyperparathyroidism of renal origin: Secondary | ICD-10-CM | POA: Diagnosis not present

## 2020-05-14 DIAGNOSIS — D631 Anemia in chronic kidney disease: Secondary | ICD-10-CM | POA: Diagnosis not present

## 2020-05-14 DIAGNOSIS — E559 Vitamin D deficiency, unspecified: Secondary | ICD-10-CM | POA: Diagnosis not present

## 2020-05-16 DIAGNOSIS — N2581 Secondary hyperparathyroidism of renal origin: Secondary | ICD-10-CM | POA: Diagnosis not present

## 2020-05-16 DIAGNOSIS — D631 Anemia in chronic kidney disease: Secondary | ICD-10-CM | POA: Diagnosis not present

## 2020-05-16 DIAGNOSIS — Z992 Dependence on renal dialysis: Secondary | ICD-10-CM | POA: Diagnosis not present

## 2020-05-16 DIAGNOSIS — N186 End stage renal disease: Secondary | ICD-10-CM | POA: Diagnosis not present

## 2020-05-16 DIAGNOSIS — D509 Iron deficiency anemia, unspecified: Secondary | ICD-10-CM | POA: Diagnosis not present

## 2020-05-16 DIAGNOSIS — E559 Vitamin D deficiency, unspecified: Secondary | ICD-10-CM | POA: Diagnosis not present

## 2020-05-18 DIAGNOSIS — D509 Iron deficiency anemia, unspecified: Secondary | ICD-10-CM | POA: Diagnosis not present

## 2020-05-18 DIAGNOSIS — E559 Vitamin D deficiency, unspecified: Secondary | ICD-10-CM | POA: Diagnosis not present

## 2020-05-18 DIAGNOSIS — D631 Anemia in chronic kidney disease: Secondary | ICD-10-CM | POA: Diagnosis not present

## 2020-05-18 DIAGNOSIS — N2581 Secondary hyperparathyroidism of renal origin: Secondary | ICD-10-CM | POA: Diagnosis not present

## 2020-05-18 DIAGNOSIS — N186 End stage renal disease: Secondary | ICD-10-CM | POA: Diagnosis not present

## 2020-05-18 DIAGNOSIS — Z992 Dependence on renal dialysis: Secondary | ICD-10-CM | POA: Diagnosis not present

## 2020-05-21 DIAGNOSIS — D631 Anemia in chronic kidney disease: Secondary | ICD-10-CM | POA: Diagnosis not present

## 2020-05-21 DIAGNOSIS — Z992 Dependence on renal dialysis: Secondary | ICD-10-CM | POA: Diagnosis not present

## 2020-05-21 DIAGNOSIS — N2581 Secondary hyperparathyroidism of renal origin: Secondary | ICD-10-CM | POA: Diagnosis not present

## 2020-05-21 DIAGNOSIS — D509 Iron deficiency anemia, unspecified: Secondary | ICD-10-CM | POA: Diagnosis not present

## 2020-05-21 DIAGNOSIS — E559 Vitamin D deficiency, unspecified: Secondary | ICD-10-CM | POA: Diagnosis not present

## 2020-05-21 DIAGNOSIS — N186 End stage renal disease: Secondary | ICD-10-CM | POA: Diagnosis not present

## 2020-05-22 DIAGNOSIS — F341 Dysthymic disorder: Secondary | ICD-10-CM | POA: Diagnosis not present

## 2020-05-22 DIAGNOSIS — N186 End stage renal disease: Secondary | ICD-10-CM | POA: Diagnosis not present

## 2020-05-22 DIAGNOSIS — K219 Gastro-esophageal reflux disease without esophagitis: Secondary | ICD-10-CM | POA: Diagnosis not present

## 2020-05-22 DIAGNOSIS — M329 Systemic lupus erythematosus, unspecified: Secondary | ICD-10-CM | POA: Diagnosis not present

## 2020-05-23 DIAGNOSIS — E559 Vitamin D deficiency, unspecified: Secondary | ICD-10-CM | POA: Diagnosis not present

## 2020-05-23 DIAGNOSIS — N2581 Secondary hyperparathyroidism of renal origin: Secondary | ICD-10-CM | POA: Diagnosis not present

## 2020-05-23 DIAGNOSIS — Z992 Dependence on renal dialysis: Secondary | ICD-10-CM | POA: Diagnosis not present

## 2020-05-23 DIAGNOSIS — N186 End stage renal disease: Secondary | ICD-10-CM | POA: Diagnosis not present

## 2020-05-23 DIAGNOSIS — D509 Iron deficiency anemia, unspecified: Secondary | ICD-10-CM | POA: Diagnosis not present

## 2020-05-23 DIAGNOSIS — D631 Anemia in chronic kidney disease: Secondary | ICD-10-CM | POA: Diagnosis not present

## 2020-05-25 DIAGNOSIS — E559 Vitamin D deficiency, unspecified: Secondary | ICD-10-CM | POA: Diagnosis not present

## 2020-05-25 DIAGNOSIS — N2581 Secondary hyperparathyroidism of renal origin: Secondary | ICD-10-CM | POA: Diagnosis not present

## 2020-05-25 DIAGNOSIS — Z992 Dependence on renal dialysis: Secondary | ICD-10-CM | POA: Diagnosis not present

## 2020-05-25 DIAGNOSIS — D631 Anemia in chronic kidney disease: Secondary | ICD-10-CM | POA: Diagnosis not present

## 2020-05-25 DIAGNOSIS — N186 End stage renal disease: Secondary | ICD-10-CM | POA: Diagnosis not present

## 2020-05-25 DIAGNOSIS — D509 Iron deficiency anemia, unspecified: Secondary | ICD-10-CM | POA: Diagnosis not present

## 2020-05-28 DIAGNOSIS — D631 Anemia in chronic kidney disease: Secondary | ICD-10-CM | POA: Diagnosis not present

## 2020-05-28 DIAGNOSIS — E559 Vitamin D deficiency, unspecified: Secondary | ICD-10-CM | POA: Diagnosis not present

## 2020-05-28 DIAGNOSIS — N186 End stage renal disease: Secondary | ICD-10-CM | POA: Diagnosis not present

## 2020-05-28 DIAGNOSIS — Z992 Dependence on renal dialysis: Secondary | ICD-10-CM | POA: Diagnosis not present

## 2020-05-28 DIAGNOSIS — D509 Iron deficiency anemia, unspecified: Secondary | ICD-10-CM | POA: Diagnosis not present

## 2020-05-28 DIAGNOSIS — N2581 Secondary hyperparathyroidism of renal origin: Secondary | ICD-10-CM | POA: Diagnosis not present

## 2020-05-30 DIAGNOSIS — D509 Iron deficiency anemia, unspecified: Secondary | ICD-10-CM | POA: Diagnosis not present

## 2020-05-30 DIAGNOSIS — N2581 Secondary hyperparathyroidism of renal origin: Secondary | ICD-10-CM | POA: Diagnosis not present

## 2020-05-30 DIAGNOSIS — E559 Vitamin D deficiency, unspecified: Secondary | ICD-10-CM | POA: Diagnosis not present

## 2020-05-30 DIAGNOSIS — D631 Anemia in chronic kidney disease: Secondary | ICD-10-CM | POA: Diagnosis not present

## 2020-05-30 DIAGNOSIS — Z992 Dependence on renal dialysis: Secondary | ICD-10-CM | POA: Diagnosis not present

## 2020-05-30 DIAGNOSIS — N186 End stage renal disease: Secondary | ICD-10-CM | POA: Diagnosis not present

## 2020-06-01 DIAGNOSIS — E559 Vitamin D deficiency, unspecified: Secondary | ICD-10-CM | POA: Diagnosis not present

## 2020-06-01 DIAGNOSIS — D509 Iron deficiency anemia, unspecified: Secondary | ICD-10-CM | POA: Diagnosis not present

## 2020-06-01 DIAGNOSIS — N2581 Secondary hyperparathyroidism of renal origin: Secondary | ICD-10-CM | POA: Diagnosis not present

## 2020-06-01 DIAGNOSIS — Z992 Dependence on renal dialysis: Secondary | ICD-10-CM | POA: Diagnosis not present

## 2020-06-01 DIAGNOSIS — N186 End stage renal disease: Secondary | ICD-10-CM | POA: Diagnosis not present

## 2020-06-01 DIAGNOSIS — D631 Anemia in chronic kidney disease: Secondary | ICD-10-CM | POA: Diagnosis not present

## 2020-06-04 DIAGNOSIS — D509 Iron deficiency anemia, unspecified: Secondary | ICD-10-CM | POA: Diagnosis not present

## 2020-06-04 DIAGNOSIS — N186 End stage renal disease: Secondary | ICD-10-CM | POA: Diagnosis not present

## 2020-06-04 DIAGNOSIS — E559 Vitamin D deficiency, unspecified: Secondary | ICD-10-CM | POA: Diagnosis not present

## 2020-06-04 DIAGNOSIS — D631 Anemia in chronic kidney disease: Secondary | ICD-10-CM | POA: Diagnosis not present

## 2020-06-04 DIAGNOSIS — Z992 Dependence on renal dialysis: Secondary | ICD-10-CM | POA: Diagnosis not present

## 2020-06-04 DIAGNOSIS — N2581 Secondary hyperparathyroidism of renal origin: Secondary | ICD-10-CM | POA: Diagnosis not present

## 2020-06-06 DIAGNOSIS — F329 Major depressive disorder, single episode, unspecified: Secondary | ICD-10-CM | POA: Diagnosis not present

## 2020-06-06 DIAGNOSIS — F419 Anxiety disorder, unspecified: Secondary | ICD-10-CM | POA: Diagnosis not present

## 2020-06-06 DIAGNOSIS — N186 End stage renal disease: Secondary | ICD-10-CM | POA: Diagnosis not present

## 2020-06-06 DIAGNOSIS — D631 Anemia in chronic kidney disease: Secondary | ICD-10-CM | POA: Diagnosis not present

## 2020-06-06 DIAGNOSIS — Z992 Dependence on renal dialysis: Secondary | ICD-10-CM | POA: Diagnosis not present

## 2020-06-06 DIAGNOSIS — Z79899 Other long term (current) drug therapy: Secondary | ICD-10-CM | POA: Diagnosis not present

## 2020-06-06 DIAGNOSIS — E559 Vitamin D deficiency, unspecified: Secondary | ICD-10-CM | POA: Diagnosis not present

## 2020-06-06 DIAGNOSIS — D509 Iron deficiency anemia, unspecified: Secondary | ICD-10-CM | POA: Diagnosis not present

## 2020-06-06 DIAGNOSIS — N2581 Secondary hyperparathyroidism of renal origin: Secondary | ICD-10-CM | POA: Diagnosis not present

## 2020-06-07 DIAGNOSIS — D509 Iron deficiency anemia, unspecified: Secondary | ICD-10-CM | POA: Diagnosis not present

## 2020-06-07 DIAGNOSIS — Z79899 Other long term (current) drug therapy: Secondary | ICD-10-CM | POA: Diagnosis not present

## 2020-06-08 DIAGNOSIS — Z992 Dependence on renal dialysis: Secondary | ICD-10-CM | POA: Diagnosis not present

## 2020-06-08 DIAGNOSIS — N2581 Secondary hyperparathyroidism of renal origin: Secondary | ICD-10-CM | POA: Diagnosis not present

## 2020-06-08 DIAGNOSIS — E559 Vitamin D deficiency, unspecified: Secondary | ICD-10-CM | POA: Diagnosis not present

## 2020-06-08 DIAGNOSIS — D509 Iron deficiency anemia, unspecified: Secondary | ICD-10-CM | POA: Diagnosis not present

## 2020-06-08 DIAGNOSIS — D631 Anemia in chronic kidney disease: Secondary | ICD-10-CM | POA: Diagnosis not present

## 2020-06-08 DIAGNOSIS — N186 End stage renal disease: Secondary | ICD-10-CM | POA: Diagnosis not present

## 2020-06-11 DIAGNOSIS — D631 Anemia in chronic kidney disease: Secondary | ICD-10-CM | POA: Diagnosis not present

## 2020-06-11 DIAGNOSIS — N186 End stage renal disease: Secondary | ICD-10-CM | POA: Diagnosis not present

## 2020-06-11 DIAGNOSIS — D509 Iron deficiency anemia, unspecified: Secondary | ICD-10-CM | POA: Diagnosis not present

## 2020-06-11 DIAGNOSIS — N2581 Secondary hyperparathyroidism of renal origin: Secondary | ICD-10-CM | POA: Diagnosis not present

## 2020-06-11 DIAGNOSIS — E559 Vitamin D deficiency, unspecified: Secondary | ICD-10-CM | POA: Diagnosis not present

## 2020-06-11 DIAGNOSIS — Z992 Dependence on renal dialysis: Secondary | ICD-10-CM | POA: Diagnosis not present

## 2020-06-13 DIAGNOSIS — E559 Vitamin D deficiency, unspecified: Secondary | ICD-10-CM | POA: Diagnosis not present

## 2020-06-13 DIAGNOSIS — Z992 Dependence on renal dialysis: Secondary | ICD-10-CM | POA: Diagnosis not present

## 2020-06-13 DIAGNOSIS — D509 Iron deficiency anemia, unspecified: Secondary | ICD-10-CM | POA: Diagnosis not present

## 2020-06-13 DIAGNOSIS — N2581 Secondary hyperparathyroidism of renal origin: Secondary | ICD-10-CM | POA: Diagnosis not present

## 2020-06-13 DIAGNOSIS — N186 End stage renal disease: Secondary | ICD-10-CM | POA: Diagnosis not present

## 2020-06-13 DIAGNOSIS — D631 Anemia in chronic kidney disease: Secondary | ICD-10-CM | POA: Diagnosis not present

## 2020-06-14 DIAGNOSIS — Z992 Dependence on renal dialysis: Secondary | ICD-10-CM | POA: Diagnosis not present

## 2020-06-14 DIAGNOSIS — N186 End stage renal disease: Secondary | ICD-10-CM | POA: Diagnosis not present

## 2020-06-15 DIAGNOSIS — E559 Vitamin D deficiency, unspecified: Secondary | ICD-10-CM | POA: Diagnosis not present

## 2020-06-15 DIAGNOSIS — D509 Iron deficiency anemia, unspecified: Secondary | ICD-10-CM | POA: Diagnosis not present

## 2020-06-15 DIAGNOSIS — Z992 Dependence on renal dialysis: Secondary | ICD-10-CM | POA: Diagnosis not present

## 2020-06-15 DIAGNOSIS — N2581 Secondary hyperparathyroidism of renal origin: Secondary | ICD-10-CM | POA: Diagnosis not present

## 2020-06-15 DIAGNOSIS — N186 End stage renal disease: Secondary | ICD-10-CM | POA: Diagnosis not present

## 2020-06-15 DIAGNOSIS — D631 Anemia in chronic kidney disease: Secondary | ICD-10-CM | POA: Diagnosis not present

## 2020-06-18 DIAGNOSIS — D631 Anemia in chronic kidney disease: Secondary | ICD-10-CM | POA: Diagnosis not present

## 2020-06-18 DIAGNOSIS — N2581 Secondary hyperparathyroidism of renal origin: Secondary | ICD-10-CM | POA: Diagnosis not present

## 2020-06-18 DIAGNOSIS — N186 End stage renal disease: Secondary | ICD-10-CM | POA: Diagnosis not present

## 2020-06-18 DIAGNOSIS — E559 Vitamin D deficiency, unspecified: Secondary | ICD-10-CM | POA: Diagnosis not present

## 2020-06-18 DIAGNOSIS — D509 Iron deficiency anemia, unspecified: Secondary | ICD-10-CM | POA: Diagnosis not present

## 2020-06-18 DIAGNOSIS — Z992 Dependence on renal dialysis: Secondary | ICD-10-CM | POA: Diagnosis not present

## 2020-06-20 DIAGNOSIS — D509 Iron deficiency anemia, unspecified: Secondary | ICD-10-CM | POA: Diagnosis not present

## 2020-06-20 DIAGNOSIS — E559 Vitamin D deficiency, unspecified: Secondary | ICD-10-CM | POA: Diagnosis not present

## 2020-06-20 DIAGNOSIS — N2581 Secondary hyperparathyroidism of renal origin: Secondary | ICD-10-CM | POA: Diagnosis not present

## 2020-06-20 DIAGNOSIS — N186 End stage renal disease: Secondary | ICD-10-CM | POA: Diagnosis not present

## 2020-06-20 DIAGNOSIS — Z992 Dependence on renal dialysis: Secondary | ICD-10-CM | POA: Diagnosis not present

## 2020-06-20 DIAGNOSIS — D631 Anemia in chronic kidney disease: Secondary | ICD-10-CM | POA: Diagnosis not present

## 2020-06-22 DIAGNOSIS — D509 Iron deficiency anemia, unspecified: Secondary | ICD-10-CM | POA: Diagnosis not present

## 2020-06-22 DIAGNOSIS — E559 Vitamin D deficiency, unspecified: Secondary | ICD-10-CM | POA: Diagnosis not present

## 2020-06-22 DIAGNOSIS — Z992 Dependence on renal dialysis: Secondary | ICD-10-CM | POA: Diagnosis not present

## 2020-06-22 DIAGNOSIS — N2581 Secondary hyperparathyroidism of renal origin: Secondary | ICD-10-CM | POA: Diagnosis not present

## 2020-06-22 DIAGNOSIS — N186 End stage renal disease: Secondary | ICD-10-CM | POA: Diagnosis not present

## 2020-06-22 DIAGNOSIS — D631 Anemia in chronic kidney disease: Secondary | ICD-10-CM | POA: Diagnosis not present

## 2020-06-25 DIAGNOSIS — D509 Iron deficiency anemia, unspecified: Secondary | ICD-10-CM | POA: Diagnosis not present

## 2020-06-25 DIAGNOSIS — E559 Vitamin D deficiency, unspecified: Secondary | ICD-10-CM | POA: Diagnosis not present

## 2020-06-25 DIAGNOSIS — N2581 Secondary hyperparathyroidism of renal origin: Secondary | ICD-10-CM | POA: Diagnosis not present

## 2020-06-25 DIAGNOSIS — D631 Anemia in chronic kidney disease: Secondary | ICD-10-CM | POA: Diagnosis not present

## 2020-06-25 DIAGNOSIS — N186 End stage renal disease: Secondary | ICD-10-CM | POA: Diagnosis not present

## 2020-06-25 DIAGNOSIS — Z992 Dependence on renal dialysis: Secondary | ICD-10-CM | POA: Diagnosis not present

## 2020-06-27 DIAGNOSIS — E559 Vitamin D deficiency, unspecified: Secondary | ICD-10-CM | POA: Diagnosis not present

## 2020-06-27 DIAGNOSIS — D631 Anemia in chronic kidney disease: Secondary | ICD-10-CM | POA: Diagnosis not present

## 2020-06-27 DIAGNOSIS — E119 Type 2 diabetes mellitus without complications: Secondary | ICD-10-CM | POA: Diagnosis not present

## 2020-06-27 DIAGNOSIS — N186 End stage renal disease: Secondary | ICD-10-CM | POA: Diagnosis not present

## 2020-06-27 DIAGNOSIS — Z992 Dependence on renal dialysis: Secondary | ICD-10-CM | POA: Diagnosis not present

## 2020-06-27 DIAGNOSIS — D509 Iron deficiency anemia, unspecified: Secondary | ICD-10-CM | POA: Diagnosis not present

## 2020-06-27 DIAGNOSIS — N2581 Secondary hyperparathyroidism of renal origin: Secondary | ICD-10-CM | POA: Diagnosis not present

## 2020-06-29 DIAGNOSIS — D509 Iron deficiency anemia, unspecified: Secondary | ICD-10-CM | POA: Diagnosis not present

## 2020-06-29 DIAGNOSIS — N2581 Secondary hyperparathyroidism of renal origin: Secondary | ICD-10-CM | POA: Diagnosis not present

## 2020-06-29 DIAGNOSIS — D631 Anemia in chronic kidney disease: Secondary | ICD-10-CM | POA: Diagnosis not present

## 2020-06-29 DIAGNOSIS — E559 Vitamin D deficiency, unspecified: Secondary | ICD-10-CM | POA: Diagnosis not present

## 2020-06-29 DIAGNOSIS — Z992 Dependence on renal dialysis: Secondary | ICD-10-CM | POA: Diagnosis not present

## 2020-06-29 DIAGNOSIS — N186 End stage renal disease: Secondary | ICD-10-CM | POA: Diagnosis not present

## 2020-07-02 ENCOUNTER — Other Ambulatory Visit (INDEPENDENT_AMBULATORY_CARE_PROVIDER_SITE_OTHER): Payer: Self-pay | Admitting: Nurse Practitioner

## 2020-07-02 DIAGNOSIS — Z992 Dependence on renal dialysis: Secondary | ICD-10-CM | POA: Diagnosis not present

## 2020-07-02 DIAGNOSIS — N186 End stage renal disease: Secondary | ICD-10-CM | POA: Diagnosis not present

## 2020-07-02 DIAGNOSIS — D509 Iron deficiency anemia, unspecified: Secondary | ICD-10-CM | POA: Diagnosis not present

## 2020-07-02 DIAGNOSIS — D631 Anemia in chronic kidney disease: Secondary | ICD-10-CM | POA: Diagnosis not present

## 2020-07-02 DIAGNOSIS — E559 Vitamin D deficiency, unspecified: Secondary | ICD-10-CM | POA: Diagnosis not present

## 2020-07-02 DIAGNOSIS — N2581 Secondary hyperparathyroidism of renal origin: Secondary | ICD-10-CM | POA: Diagnosis not present

## 2020-07-03 ENCOUNTER — Encounter (INDEPENDENT_AMBULATORY_CARE_PROVIDER_SITE_OTHER): Payer: Self-pay | Admitting: Nurse Practitioner

## 2020-07-03 ENCOUNTER — Ambulatory Visit (INDEPENDENT_AMBULATORY_CARE_PROVIDER_SITE_OTHER): Payer: Medicare Other | Admitting: Nurse Practitioner

## 2020-07-03 ENCOUNTER — Other Ambulatory Visit: Payer: Self-pay

## 2020-07-03 ENCOUNTER — Ambulatory Visit (INDEPENDENT_AMBULATORY_CARE_PROVIDER_SITE_OTHER): Payer: Medicare Other

## 2020-07-03 VITALS — BP 99/64 | HR 64 | Ht 63.0 in | Wt 104.0 lb

## 2020-07-03 DIAGNOSIS — Z992 Dependence on renal dialysis: Secondary | ICD-10-CM

## 2020-07-03 DIAGNOSIS — E1122 Type 2 diabetes mellitus with diabetic chronic kidney disease: Secondary | ICD-10-CM

## 2020-07-03 DIAGNOSIS — N186 End stage renal disease: Secondary | ICD-10-CM | POA: Diagnosis not present

## 2020-07-03 DIAGNOSIS — I10 Essential (primary) hypertension: Secondary | ICD-10-CM

## 2020-07-03 NOTE — Progress Notes (Signed)
Subjective:    Patient ID: [name], female    DOB: Sep 04, 1975, 45 y.o.   MRN: [id number] Chief Complaint  Patient presents with  . Follow-up    6 mo U/S     The patient returns to the office for followup of their dialysis access. The function of the access has been stable. The patient denies increased bleeding time or increased recirculation. Patient denies difficulty with cannulation. The patient denies hand pain or other symptoms consistent with steal phenomena.  No significant arm swelling.  The patient denies redness or swelling at the access site. The patient denies fever or chills at home or while on dialysis.  The patient denies amaurosis fugax or recent TIA symptoms. There are no recent neurological changes noted. The patient denies claudication symptoms or rest pain symptoms. The patient denies history of DVT, PE or superficial thrombophlebitis. The patient denies recent episodes of angina or shortness of breath.   This patient has a flow volume of 1545.  No area of obvious stenosis.      Review of Systems  All other systems reviewed and are negative.      Objective:   Physical Exam Vitals reviewed.  HENT:     Head: Normocephalic.  Cardiovascular:     Rate and Rhythm: Normal rate and regular rhythm.     Pulses:          Radial pulses are 1+ on the left side.     Arteriovenous access: left arteriovenous access is present.    Comments: Good thrill and bruit Pulmonary:     Effort: Pulmonary effort is normal.  Neurological:     Mental Status: She is alert and oriented to person, place, and time.  Psychiatric:        Mood and Affect: Mood normal.        Behavior: Behavior normal.        Thought Content: Thought content normal.        Judgment: Judgment normal.     BP 99/64   Pulse 64   Ht 5\' 3"  (1.6 m)   Wt 104 lb (47.2 kg)   BMI 18.42 kg/m   Past Medical History:  Diagnosis Date  . Anemia   . BV (bacterial vaginosis) 11/08/2013  .  Depression   . Dialysis patient ([place])   . DVT (deep venous thrombosis) ([employer])   . Hypertension   . Lupus ([place])   . Renal disorder   . Vaginal odor 11/08/2013    Social History   Socioeconomic History  . Marital status: Single    Spouse name: Not on file  . Number of children: Not on file  . Years of education: Not on file  . Highest education level: Not on file  Occupational History  . Not on file  Tobacco Use  . Smoking status: Current Every Day Smoker    Packs/day: 0.25    Years: 15.00    Pack years: 3.75    Types: Cigarettes  . Smokeless tobacco: Never Used  Vaping Use  . Vaping Use: Never used  Substance and Sexual Activity  . Alcohol use: No  . Drug use: No  . Sexual activity: Not Currently    Birth control/protection: None  Other Topics Concern  . Not on file  Social History Narrative   Lives at [facility]   Social Determinants of Health   Financial Resource Strain: Not on file  Food Insecurity: Not on file  Transportation Needs: Not  on file  Physical Activity: Not on file  Stress: Not on file  Social Connections: Not on file  Intimate Partner Violence: Not on file    Past Surgical History:  Procedure Laterality Date  . A/V SHUNT INTERVENTION N/A 10/24/2016   Procedure: A/V SHUNT INTERVENTION;  Surgeon: Katha Cabal, MD;  Location: [place] [facility];  Service: Cardiovascular;  Laterality: N/A;  . A/V SHUNTOGRAM Left 12/08/2017   Procedure: A/V SHUNTOGRAM;  Surgeon: Katha Cabal, MD;  Location: [place] [facility];  Service: Cardiovascular;  Laterality: Left;  . A/V SHUNTOGRAM Left 01/28/2018   Procedure: A/V SHUNTOGRAM;  Surgeon: Algernon Huxley, MD;  Location: [place] [facility];  Service: Cardiovascular;  Laterality: Left;  . A/V SHUNTOGRAM Left 04/01/2018   Procedure: A/V SHUNTOGRAM;  Surgeon: Algernon Huxley, MD;  Location: [place] [facility];  Service: Cardiovascular;  Laterality: Left;  . A/V SHUNTOGRAM Left 04/28/2019    Procedure: A/V SHUNTOGRAM;  Surgeon: Algernon Huxley, MD;  Location: [place] [facility];  Service: Cardiovascular;  Laterality: Left;  . AV FISTULA PLACEMENT    . DIALYSIS/PERMA CATHETER INSERTION  10/23/2016   Procedure: DIALYSIS/PERMA CATHETER INSERTION;  Surgeon: Algernon Huxley, MD;  Location: [place] [facility];  Service: Cardiovascular;;  . IR REMOVAL TUN CV CATH W/O FL  09/22/2017  . IR THROMBECTOMY AV FISTULA W/THROMBOLYSIS INC/SHUNT/IMG LEFT Left 09/04/2017  . IR US GUIDE VASC ACCESS LEFT  09/04/2017  . MASS EXCISION Left 06/01/2018   Procedure: EXCISION 5 X 3CM LIPOMA LEFT THIGH;  Surgeon: Aviva Signs, MD;  Location: [facility];  Service: General;  Laterality: Left;  . PERIPHERAL VASCULAR CATHETERIZATION Left 01/04/2015   Procedure: A/V Shuntogram/Fistulagram;  Surgeon: Algernon Huxley, MD;  Location: [place] [facility];  Service: Cardiovascular;  Laterality: Left;  . PERIPHERAL VASCULAR CATHETERIZATION N/A 01/04/2015   Procedure: A/V Shunt Intervention;  Surgeon: Algernon Huxley, MD;  Location: [place] [facility];  Service: Cardiovascular;  Laterality: N/A;  . PERIPHERAL VASCULAR CATHETERIZATION Left 04/13/2015   Procedure: A/V Shuntogram/Fistulagram;  Surgeon: Katha Cabal, MD;  Location: [place] [facility];  Service: Cardiovascular;  Laterality: Left;  . PERIPHERAL VASCULAR CATHETERIZATION Left 04/13/2015   Procedure: A/V Shunt Intervention;  Surgeon: Katha Cabal, MD;  Location: [place] [facility];  Service: Cardiovascular;  Laterality: Left;    Family History  Problem Relation Age of Onset  . Seizures Son   . Kidney disease Mother        on dialysis  . Lupus Mother     Allergies  Allergen Reactions  . Dust Mite Extract Other (See Comments)    sneezing    CBC Latest Ref Rng & Units 02/08/2020 06/01/2018 09/04/2017  WBC 4.0 - 10.5 K/uL 24.4(H) - 9.3  Hemoglobin 12.0 - 15.0 g/dL 13.6 12.3 10.2(L)  Hematocrit 36.0 - 46.0 % 39.1 36.3 30.4(L)  Platelets 150 - 400 K/uL 181 180  241      CMP     Component Value Date/Time   NA 133 (L) 02/08/2020 1750   NA 137 02/18/2013 0440   K 3.3 (L) 02/08/2020 1750   K 4.9 07/06/2014 1308   CL 90 (L) 02/08/2020 1750   CL 100 02/18/2013 0440   CO2 26 02/08/2020 1750   CO2 33 (H) 02/18/2013 0440   GLUCOSE 92 02/08/2020 1750   GLUCOSE 73 02/18/2013 0440   BUN 22 (H) 02/08/2020 1750   BUN 9 02/18/2013 0440   CREATININE  6.25 (H) 02/08/2020 1750   CREATININE 1.93 (H) 02/18/2013 0440   CALCIUM 8.4 (L) 02/08/2020 1750   CALCIUM 8.2 (L) 02/18/2013 0440   CALCIUM (LL) 04/08/2007 1300    6.1 Result repeated and verified. CRITICAL VALUE NOTED.  VALUE IS CONSISTENT WITH PREVIOUSLY REPORTED AND CALLED VALUE. CRITICAL RESULT CALLED TO, READ BACK BY AND VERIFIED WITH: MENIGOSTO,A RN [id number] [place] S.   PROT 8.5 (H) 02/08/2020 1750   ALBUMIN 4.1 02/08/2020 1750   AST 18 02/08/2020 1750   ALT 18 02/08/2020 1750   ALKPHOS 102 02/08/2020 1750   BILITOT 0.5 02/08/2020 1750   GFRNONAA 8 (L) 02/08/2020 1750   GFRNONAA 32 (L) 02/18/2013 0440   GFRAA 8 (L) 06/01/2018 0822   GFRAA 37 (L) 02/18/2013 0440     No results found.     Assessment & Plan:   1. ESRD on dialysis [facility]) Recommend:  The patient is doing well and currently has adequate dialysis access. The patient's dialysis center is not reporting any access issues. Flow pattern is stable when compared to the prior ultrasound.  The patient should have a duplex ultrasound of the dialysis access in 6 months. The patient will follow-up with me in the office after each ultrasound     2. Essential (primary) hypertension Continue antihypertensive medications as already ordered, these medications have been reviewed and there are no changes at this time.   3. Type 2 diabetes mellitus with chronic kidney disease on chronic dialysis, unspecified whether long term insulin use ([place]) Continue hypoglycemic medications as already ordered, these medications have been reviewed  and there are no changes at this time.  Hgb A1C to be monitored as already arranged by primary service    Current Outpatient Medications on File Prior to Visit  Medication Sig Dispense Refill  . albuterol (PROVENTIL) (2.5 MG/3ML) 0.083% nebulizer solution Take 2.5 mg by nebulization 4 (four) times daily as needed for wheezing or shortness of breath.     . ALPRAZolam (XANAX) 0.25 MG tablet Take 0.25 mg by mouth 2 (two) times daily.    Marland Kitchen aspirin EC 81 MG tablet Take 81 mg by mouth daily.    . chlorhexidine (PERIDEX) 0.12 % solution Use as directed 5 mLs in the mouth or throat 2 (two) times daily as needed.     . clopidogrel (PLAVIX) 75 MG tablet Take 75 mg by mouth daily.    Marland Kitchen docusate sodium (COLACE) 100 MG capsule Take 100 mg by mouth daily.    Marland Kitchen epoetin alfa (EPOGEN,PROCRIT) 4000 UNIT/ML injection Inject 4,000 Units into the vein See admin instructions. Mon, wed, and Friday at dialysis    . guaiFENesin (ROBITUSSIN) 100 MG/5ML liquid Take 200 mg by mouth every 6 (six) hours as needed for cough.     Marland Kitchen HYDROcodone-acetaminophen (NORCO) 5-325 MG tablet Take 1 tablet by mouth every 6 (six) hours as needed for moderate pain. 20 tablet 0  . hydroxychloroquine (PLAQUENIL) 200 MG tablet Take 200 mg by mouth daily.    Marland Kitchen ipratropium (ATROVENT) 0.02 % nebulizer solution Take 250 mcg by nebulization 4 (four) times daily as needed for wheezing or shortness of breath.     . lidocaine-prilocaine (EMLA) cream Apply 1 application topically as needed (port access).     Marland Kitchen loratadine (CLARITIN) 10 MG tablet Take 10 mg by mouth daily as needed for allergies.    . midodrine (PROAMATINE) 10 MG tablet Take 10 mg by mouth 2 (two) times daily.     Marland Kitchen  mirtazapine (REMERON) 15 MG tablet Take 7.5 mg by mouth at bedtime.     . multivitamin (RENA-VIT) TABS tablet Take 1 tablet by mouth daily.    . pantoprazole (PROTONIX) 40 MG tablet Take 40 mg by mouth daily.    . phenytoin (DILANTIN) 100 MG ER capsule Take 100 mg by  mouth 2 (two) times daily.     . sertraline (ZOLOFT) 100 MG tablet Take 200 mg by mouth daily.     . sevelamer carbonate (RENVELA) 800 MG tablet Take 800 mg by mouth See admin instructions. Take 800 mg by mouth three times daily with meals and 800 mg by mouth twice daily with snacks    . simvastatin (ZOCOR) 40 MG tablet Take 40 mg by mouth at bedtime.     . ANTACID CALCIUM 500 MG chewable tablet Chew by mouth.     No current facility-administered medications on file prior to visit.    There are no Patient Instructions on file for this visit. No follow-ups on file.   Kris Hartmann, NP

## 2020-07-04 DIAGNOSIS — E559 Vitamin D deficiency, unspecified: Secondary | ICD-10-CM | POA: Diagnosis not present

## 2020-07-04 DIAGNOSIS — N186 End stage renal disease: Secondary | ICD-10-CM | POA: Diagnosis not present

## 2020-07-04 DIAGNOSIS — D631 Anemia in chronic kidney disease: Secondary | ICD-10-CM | POA: Diagnosis not present

## 2020-07-04 DIAGNOSIS — N2581 Secondary hyperparathyroidism of renal origin: Secondary | ICD-10-CM | POA: Diagnosis not present

## 2020-07-04 DIAGNOSIS — D509 Iron deficiency anemia, unspecified: Secondary | ICD-10-CM | POA: Diagnosis not present

## 2020-07-04 DIAGNOSIS — Z992 Dependence on renal dialysis: Secondary | ICD-10-CM | POA: Diagnosis not present

## 2020-07-06 DIAGNOSIS — N2581 Secondary hyperparathyroidism of renal origin: Secondary | ICD-10-CM | POA: Diagnosis not present

## 2020-07-06 DIAGNOSIS — E559 Vitamin D deficiency, unspecified: Secondary | ICD-10-CM | POA: Diagnosis not present

## 2020-07-06 DIAGNOSIS — N186 End stage renal disease: Secondary | ICD-10-CM | POA: Diagnosis not present

## 2020-07-06 DIAGNOSIS — D631 Anemia in chronic kidney disease: Secondary | ICD-10-CM | POA: Diagnosis not present

## 2020-07-06 DIAGNOSIS — Z992 Dependence on renal dialysis: Secondary | ICD-10-CM | POA: Diagnosis not present

## 2020-07-06 DIAGNOSIS — D509 Iron deficiency anemia, unspecified: Secondary | ICD-10-CM | POA: Diagnosis not present

## 2020-07-09 DIAGNOSIS — D631 Anemia in chronic kidney disease: Secondary | ICD-10-CM | POA: Diagnosis not present

## 2020-07-09 DIAGNOSIS — Z992 Dependence on renal dialysis: Secondary | ICD-10-CM | POA: Diagnosis not present

## 2020-07-09 DIAGNOSIS — D509 Iron deficiency anemia, unspecified: Secondary | ICD-10-CM | POA: Diagnosis not present

## 2020-07-09 DIAGNOSIS — N2581 Secondary hyperparathyroidism of renal origin: Secondary | ICD-10-CM | POA: Diagnosis not present

## 2020-07-09 DIAGNOSIS — E559 Vitamin D deficiency, unspecified: Secondary | ICD-10-CM | POA: Diagnosis not present

## 2020-07-09 DIAGNOSIS — N186 End stage renal disease: Secondary | ICD-10-CM | POA: Diagnosis not present

## 2020-07-11 DIAGNOSIS — Z79899 Other long term (current) drug therapy: Secondary | ICD-10-CM | POA: Diagnosis not present

## 2020-07-11 DIAGNOSIS — F329 Major depressive disorder, single episode, unspecified: Secondary | ICD-10-CM | POA: Diagnosis not present

## 2020-07-11 DIAGNOSIS — D509 Iron deficiency anemia, unspecified: Secondary | ICD-10-CM | POA: Diagnosis not present

## 2020-07-11 DIAGNOSIS — Z992 Dependence on renal dialysis: Secondary | ICD-10-CM | POA: Diagnosis not present

## 2020-07-11 DIAGNOSIS — N2581 Secondary hyperparathyroidism of renal origin: Secondary | ICD-10-CM | POA: Diagnosis not present

## 2020-07-11 DIAGNOSIS — N186 End stage renal disease: Secondary | ICD-10-CM | POA: Diagnosis not present

## 2020-07-11 DIAGNOSIS — E559 Vitamin D deficiency, unspecified: Secondary | ICD-10-CM | POA: Diagnosis not present

## 2020-07-11 DIAGNOSIS — F419 Anxiety disorder, unspecified: Secondary | ICD-10-CM | POA: Diagnosis not present

## 2020-07-11 DIAGNOSIS — D631 Anemia in chronic kidney disease: Secondary | ICD-10-CM | POA: Diagnosis not present

## 2020-07-13 DIAGNOSIS — N2581 Secondary hyperparathyroidism of renal origin: Secondary | ICD-10-CM | POA: Diagnosis not present

## 2020-07-13 DIAGNOSIS — Z992 Dependence on renal dialysis: Secondary | ICD-10-CM | POA: Diagnosis not present

## 2020-07-13 DIAGNOSIS — D509 Iron deficiency anemia, unspecified: Secondary | ICD-10-CM | POA: Diagnosis not present

## 2020-07-13 DIAGNOSIS — N186 End stage renal disease: Secondary | ICD-10-CM | POA: Diagnosis not present

## 2020-07-13 DIAGNOSIS — D631 Anemia in chronic kidney disease: Secondary | ICD-10-CM | POA: Diagnosis not present

## 2020-07-13 DIAGNOSIS — E559 Vitamin D deficiency, unspecified: Secondary | ICD-10-CM | POA: Diagnosis not present

## 2020-07-14 DIAGNOSIS — N186 End stage renal disease: Secondary | ICD-10-CM | POA: Diagnosis not present

## 2020-07-14 DIAGNOSIS — Z992 Dependence on renal dialysis: Secondary | ICD-10-CM | POA: Diagnosis not present

## 2020-07-16 DIAGNOSIS — E559 Vitamin D deficiency, unspecified: Secondary | ICD-10-CM | POA: Diagnosis not present

## 2020-07-16 DIAGNOSIS — N186 End stage renal disease: Secondary | ICD-10-CM | POA: Diagnosis not present

## 2020-07-16 DIAGNOSIS — Z992 Dependence on renal dialysis: Secondary | ICD-10-CM | POA: Diagnosis not present

## 2020-07-16 DIAGNOSIS — D509 Iron deficiency anemia, unspecified: Secondary | ICD-10-CM | POA: Diagnosis not present

## 2020-07-16 DIAGNOSIS — N2581 Secondary hyperparathyroidism of renal origin: Secondary | ICD-10-CM | POA: Diagnosis not present

## 2020-07-18 DIAGNOSIS — D509 Iron deficiency anemia, unspecified: Secondary | ICD-10-CM | POA: Diagnosis not present

## 2020-07-18 DIAGNOSIS — N2581 Secondary hyperparathyroidism of renal origin: Secondary | ICD-10-CM | POA: Diagnosis not present

## 2020-07-18 DIAGNOSIS — Z992 Dependence on renal dialysis: Secondary | ICD-10-CM | POA: Diagnosis not present

## 2020-07-18 DIAGNOSIS — E559 Vitamin D deficiency, unspecified: Secondary | ICD-10-CM | POA: Diagnosis not present

## 2020-07-18 DIAGNOSIS — N186 End stage renal disease: Secondary | ICD-10-CM | POA: Diagnosis not present

## 2020-07-20 DIAGNOSIS — Z992 Dependence on renal dialysis: Secondary | ICD-10-CM | POA: Diagnosis not present

## 2020-07-20 DIAGNOSIS — D509 Iron deficiency anemia, unspecified: Secondary | ICD-10-CM | POA: Diagnosis not present

## 2020-07-20 DIAGNOSIS — N186 End stage renal disease: Secondary | ICD-10-CM | POA: Diagnosis not present

## 2020-07-20 DIAGNOSIS — N2581 Secondary hyperparathyroidism of renal origin: Secondary | ICD-10-CM | POA: Diagnosis not present

## 2020-07-20 DIAGNOSIS — E559 Vitamin D deficiency, unspecified: Secondary | ICD-10-CM | POA: Diagnosis not present

## 2020-07-23 DIAGNOSIS — E559 Vitamin D deficiency, unspecified: Secondary | ICD-10-CM | POA: Diagnosis not present

## 2020-07-23 DIAGNOSIS — N2581 Secondary hyperparathyroidism of renal origin: Secondary | ICD-10-CM | POA: Diagnosis not present

## 2020-07-23 DIAGNOSIS — Z992 Dependence on renal dialysis: Secondary | ICD-10-CM | POA: Diagnosis not present

## 2020-07-23 DIAGNOSIS — N186 End stage renal disease: Secondary | ICD-10-CM | POA: Diagnosis not present

## 2020-07-23 DIAGNOSIS — D509 Iron deficiency anemia, unspecified: Secondary | ICD-10-CM | POA: Diagnosis not present

## 2020-07-25 DIAGNOSIS — N186 End stage renal disease: Secondary | ICD-10-CM | POA: Diagnosis not present

## 2020-07-25 DIAGNOSIS — E559 Vitamin D deficiency, unspecified: Secondary | ICD-10-CM | POA: Diagnosis not present

## 2020-07-25 DIAGNOSIS — N2581 Secondary hyperparathyroidism of renal origin: Secondary | ICD-10-CM | POA: Diagnosis not present

## 2020-07-25 DIAGNOSIS — Z992 Dependence on renal dialysis: Secondary | ICD-10-CM | POA: Diagnosis not present

## 2020-07-25 DIAGNOSIS — D509 Iron deficiency anemia, unspecified: Secondary | ICD-10-CM | POA: Diagnosis not present

## 2020-07-27 DIAGNOSIS — E559 Vitamin D deficiency, unspecified: Secondary | ICD-10-CM | POA: Diagnosis not present

## 2020-07-27 DIAGNOSIS — D509 Iron deficiency anemia, unspecified: Secondary | ICD-10-CM | POA: Diagnosis not present

## 2020-07-27 DIAGNOSIS — Z992 Dependence on renal dialysis: Secondary | ICD-10-CM | POA: Diagnosis not present

## 2020-07-27 DIAGNOSIS — N186 End stage renal disease: Secondary | ICD-10-CM | POA: Diagnosis not present

## 2020-07-27 DIAGNOSIS — N2581 Secondary hyperparathyroidism of renal origin: Secondary | ICD-10-CM | POA: Diagnosis not present

## 2020-07-30 DIAGNOSIS — Z992 Dependence on renal dialysis: Secondary | ICD-10-CM | POA: Diagnosis not present

## 2020-07-30 DIAGNOSIS — D509 Iron deficiency anemia, unspecified: Secondary | ICD-10-CM | POA: Diagnosis not present

## 2020-07-30 DIAGNOSIS — E559 Vitamin D deficiency, unspecified: Secondary | ICD-10-CM | POA: Diagnosis not present

## 2020-07-30 DIAGNOSIS — N186 End stage renal disease: Secondary | ICD-10-CM | POA: Diagnosis not present

## 2020-07-30 DIAGNOSIS — N2581 Secondary hyperparathyroidism of renal origin: Secondary | ICD-10-CM | POA: Diagnosis not present

## 2020-08-01 DIAGNOSIS — Z992 Dependence on renal dialysis: Secondary | ICD-10-CM | POA: Diagnosis not present

## 2020-08-01 DIAGNOSIS — D509 Iron deficiency anemia, unspecified: Secondary | ICD-10-CM | POA: Diagnosis not present

## 2020-08-01 DIAGNOSIS — E559 Vitamin D deficiency, unspecified: Secondary | ICD-10-CM | POA: Diagnosis not present

## 2020-08-01 DIAGNOSIS — N186 End stage renal disease: Secondary | ICD-10-CM | POA: Diagnosis not present

## 2020-08-01 DIAGNOSIS — N2581 Secondary hyperparathyroidism of renal origin: Secondary | ICD-10-CM | POA: Diagnosis not present

## 2020-08-03 DIAGNOSIS — N186 End stage renal disease: Secondary | ICD-10-CM | POA: Diagnosis not present

## 2020-08-03 DIAGNOSIS — E559 Vitamin D deficiency, unspecified: Secondary | ICD-10-CM | POA: Diagnosis not present

## 2020-08-03 DIAGNOSIS — N2581 Secondary hyperparathyroidism of renal origin: Secondary | ICD-10-CM | POA: Diagnosis not present

## 2020-08-03 DIAGNOSIS — Z992 Dependence on renal dialysis: Secondary | ICD-10-CM | POA: Diagnosis not present

## 2020-08-03 DIAGNOSIS — D509 Iron deficiency anemia, unspecified: Secondary | ICD-10-CM | POA: Diagnosis not present

## 2020-08-06 DIAGNOSIS — E559 Vitamin D deficiency, unspecified: Secondary | ICD-10-CM | POA: Diagnosis not present

## 2020-08-06 DIAGNOSIS — N186 End stage renal disease: Secondary | ICD-10-CM | POA: Diagnosis not present

## 2020-08-06 DIAGNOSIS — N2581 Secondary hyperparathyroidism of renal origin: Secondary | ICD-10-CM | POA: Diagnosis not present

## 2020-08-06 DIAGNOSIS — Z992 Dependence on renal dialysis: Secondary | ICD-10-CM | POA: Diagnosis not present

## 2020-08-06 DIAGNOSIS — D509 Iron deficiency anemia, unspecified: Secondary | ICD-10-CM | POA: Diagnosis not present

## 2020-08-08 DIAGNOSIS — N2581 Secondary hyperparathyroidism of renal origin: Secondary | ICD-10-CM | POA: Diagnosis not present

## 2020-08-08 DIAGNOSIS — D509 Iron deficiency anemia, unspecified: Secondary | ICD-10-CM | POA: Diagnosis not present

## 2020-08-08 DIAGNOSIS — F419 Anxiety disorder, unspecified: Secondary | ICD-10-CM | POA: Diagnosis not present

## 2020-08-08 DIAGNOSIS — E559 Vitamin D deficiency, unspecified: Secondary | ICD-10-CM | POA: Diagnosis not present

## 2020-08-08 DIAGNOSIS — F329 Major depressive disorder, single episode, unspecified: Secondary | ICD-10-CM | POA: Diagnosis not present

## 2020-08-08 DIAGNOSIS — Z992 Dependence on renal dialysis: Secondary | ICD-10-CM | POA: Diagnosis not present

## 2020-08-08 DIAGNOSIS — Z79899 Other long term (current) drug therapy: Secondary | ICD-10-CM | POA: Diagnosis not present

## 2020-08-08 DIAGNOSIS — N186 End stage renal disease: Secondary | ICD-10-CM | POA: Diagnosis not present

## 2020-08-10 DIAGNOSIS — N2581 Secondary hyperparathyroidism of renal origin: Secondary | ICD-10-CM | POA: Diagnosis not present

## 2020-08-10 DIAGNOSIS — N186 End stage renal disease: Secondary | ICD-10-CM | POA: Diagnosis not present

## 2020-08-10 DIAGNOSIS — D509 Iron deficiency anemia, unspecified: Secondary | ICD-10-CM | POA: Diagnosis not present

## 2020-08-10 DIAGNOSIS — Z992 Dependence on renal dialysis: Secondary | ICD-10-CM | POA: Diagnosis not present

## 2020-08-10 DIAGNOSIS — E559 Vitamin D deficiency, unspecified: Secondary | ICD-10-CM | POA: Diagnosis not present

## 2020-08-13 ENCOUNTER — Other Ambulatory Visit (HOSPITAL_COMMUNITY): Payer: Self-pay | Admitting: Internal Medicine

## 2020-08-13 DIAGNOSIS — D509 Iron deficiency anemia, unspecified: Secondary | ICD-10-CM | POA: Diagnosis not present

## 2020-08-13 DIAGNOSIS — Z992 Dependence on renal dialysis: Secondary | ICD-10-CM | POA: Diagnosis not present

## 2020-08-13 DIAGNOSIS — E559 Vitamin D deficiency, unspecified: Secondary | ICD-10-CM | POA: Diagnosis not present

## 2020-08-13 DIAGNOSIS — N186 End stage renal disease: Secondary | ICD-10-CM | POA: Diagnosis not present

## 2020-08-13 DIAGNOSIS — N2581 Secondary hyperparathyroidism of renal origin: Secondary | ICD-10-CM | POA: Diagnosis not present

## 2020-08-13 DIAGNOSIS — Z1231 Encounter for screening mammogram for malignant neoplasm of breast: Secondary | ICD-10-CM

## 2020-08-14 DIAGNOSIS — N186 End stage renal disease: Secondary | ICD-10-CM | POA: Diagnosis not present

## 2020-08-14 DIAGNOSIS — Z992 Dependence on renal dialysis: Secondary | ICD-10-CM | POA: Diagnosis not present

## 2020-08-15 DIAGNOSIS — N186 End stage renal disease: Secondary | ICD-10-CM | POA: Diagnosis not present

## 2020-08-15 DIAGNOSIS — D631 Anemia in chronic kidney disease: Secondary | ICD-10-CM | POA: Diagnosis not present

## 2020-08-15 DIAGNOSIS — Z992 Dependence on renal dialysis: Secondary | ICD-10-CM | POA: Diagnosis not present

## 2020-08-15 DIAGNOSIS — E559 Vitamin D deficiency, unspecified: Secondary | ICD-10-CM | POA: Diagnosis not present

## 2020-08-15 DIAGNOSIS — D509 Iron deficiency anemia, unspecified: Secondary | ICD-10-CM | POA: Diagnosis not present

## 2020-08-15 DIAGNOSIS — N2581 Secondary hyperparathyroidism of renal origin: Secondary | ICD-10-CM | POA: Diagnosis not present

## 2020-08-17 DIAGNOSIS — N2581 Secondary hyperparathyroidism of renal origin: Secondary | ICD-10-CM | POA: Diagnosis not present

## 2020-08-17 DIAGNOSIS — E559 Vitamin D deficiency, unspecified: Secondary | ICD-10-CM | POA: Diagnosis not present

## 2020-08-17 DIAGNOSIS — D509 Iron deficiency anemia, unspecified: Secondary | ICD-10-CM | POA: Diagnosis not present

## 2020-08-17 DIAGNOSIS — N186 End stage renal disease: Secondary | ICD-10-CM | POA: Diagnosis not present

## 2020-08-17 DIAGNOSIS — D631 Anemia in chronic kidney disease: Secondary | ICD-10-CM | POA: Diagnosis not present

## 2020-08-17 DIAGNOSIS — Z992 Dependence on renal dialysis: Secondary | ICD-10-CM | POA: Diagnosis not present

## 2020-08-20 DIAGNOSIS — D631 Anemia in chronic kidney disease: Secondary | ICD-10-CM | POA: Diagnosis not present

## 2020-08-20 DIAGNOSIS — N186 End stage renal disease: Secondary | ICD-10-CM | POA: Diagnosis not present

## 2020-08-20 DIAGNOSIS — N2581 Secondary hyperparathyroidism of renal origin: Secondary | ICD-10-CM | POA: Diagnosis not present

## 2020-08-20 DIAGNOSIS — E559 Vitamin D deficiency, unspecified: Secondary | ICD-10-CM | POA: Diagnosis not present

## 2020-08-20 DIAGNOSIS — Z992 Dependence on renal dialysis: Secondary | ICD-10-CM | POA: Diagnosis not present

## 2020-08-20 DIAGNOSIS — D509 Iron deficiency anemia, unspecified: Secondary | ICD-10-CM | POA: Diagnosis not present

## 2020-08-21 DIAGNOSIS — N186 End stage renal disease: Secondary | ICD-10-CM | POA: Diagnosis not present

## 2020-08-21 DIAGNOSIS — Z1331 Encounter for screening for depression: Secondary | ICD-10-CM | POA: Diagnosis not present

## 2020-08-21 DIAGNOSIS — Z1389 Encounter for screening for other disorder: Secondary | ICD-10-CM | POA: Diagnosis not present

## 2020-08-21 DIAGNOSIS — F341 Dysthymic disorder: Secondary | ICD-10-CM | POA: Diagnosis not present

## 2020-08-21 DIAGNOSIS — K219 Gastro-esophageal reflux disease without esophagitis: Secondary | ICD-10-CM | POA: Diagnosis not present

## 2020-08-21 DIAGNOSIS — M329 Systemic lupus erythematosus, unspecified: Secondary | ICD-10-CM | POA: Diagnosis not present

## 2020-08-22 DIAGNOSIS — N186 End stage renal disease: Secondary | ICD-10-CM | POA: Diagnosis not present

## 2020-08-22 DIAGNOSIS — D631 Anemia in chronic kidney disease: Secondary | ICD-10-CM | POA: Diagnosis not present

## 2020-08-22 DIAGNOSIS — N2581 Secondary hyperparathyroidism of renal origin: Secondary | ICD-10-CM | POA: Diagnosis not present

## 2020-08-22 DIAGNOSIS — D509 Iron deficiency anemia, unspecified: Secondary | ICD-10-CM | POA: Diagnosis not present

## 2020-08-22 DIAGNOSIS — E559 Vitamin D deficiency, unspecified: Secondary | ICD-10-CM | POA: Diagnosis not present

## 2020-08-22 DIAGNOSIS — Z992 Dependence on renal dialysis: Secondary | ICD-10-CM | POA: Diagnosis not present

## 2020-08-24 DIAGNOSIS — N2581 Secondary hyperparathyroidism of renal origin: Secondary | ICD-10-CM | POA: Diagnosis not present

## 2020-08-24 DIAGNOSIS — D509 Iron deficiency anemia, unspecified: Secondary | ICD-10-CM | POA: Diagnosis not present

## 2020-08-24 DIAGNOSIS — Z992 Dependence on renal dialysis: Secondary | ICD-10-CM | POA: Diagnosis not present

## 2020-08-24 DIAGNOSIS — N186 End stage renal disease: Secondary | ICD-10-CM | POA: Diagnosis not present

## 2020-08-24 DIAGNOSIS — D631 Anemia in chronic kidney disease: Secondary | ICD-10-CM | POA: Diagnosis not present

## 2020-08-24 DIAGNOSIS — E559 Vitamin D deficiency, unspecified: Secondary | ICD-10-CM | POA: Diagnosis not present

## 2020-08-27 DIAGNOSIS — E559 Vitamin D deficiency, unspecified: Secondary | ICD-10-CM | POA: Diagnosis not present

## 2020-08-27 DIAGNOSIS — N186 End stage renal disease: Secondary | ICD-10-CM | POA: Diagnosis not present

## 2020-08-27 DIAGNOSIS — D509 Iron deficiency anemia, unspecified: Secondary | ICD-10-CM | POA: Diagnosis not present

## 2020-08-27 DIAGNOSIS — N2581 Secondary hyperparathyroidism of renal origin: Secondary | ICD-10-CM | POA: Diagnosis not present

## 2020-08-27 DIAGNOSIS — Z992 Dependence on renal dialysis: Secondary | ICD-10-CM | POA: Diagnosis not present

## 2020-08-27 DIAGNOSIS — D631 Anemia in chronic kidney disease: Secondary | ICD-10-CM | POA: Diagnosis not present

## 2020-08-29 DIAGNOSIS — Z992 Dependence on renal dialysis: Secondary | ICD-10-CM | POA: Diagnosis not present

## 2020-08-29 DIAGNOSIS — N2581 Secondary hyperparathyroidism of renal origin: Secondary | ICD-10-CM | POA: Diagnosis not present

## 2020-08-29 DIAGNOSIS — D631 Anemia in chronic kidney disease: Secondary | ICD-10-CM | POA: Diagnosis not present

## 2020-08-29 DIAGNOSIS — D509 Iron deficiency anemia, unspecified: Secondary | ICD-10-CM | POA: Diagnosis not present

## 2020-08-29 DIAGNOSIS — E559 Vitamin D deficiency, unspecified: Secondary | ICD-10-CM | POA: Diagnosis not present

## 2020-08-29 DIAGNOSIS — N186 End stage renal disease: Secondary | ICD-10-CM | POA: Diagnosis not present

## 2020-08-31 DIAGNOSIS — Z992 Dependence on renal dialysis: Secondary | ICD-10-CM | POA: Diagnosis not present

## 2020-08-31 DIAGNOSIS — E559 Vitamin D deficiency, unspecified: Secondary | ICD-10-CM | POA: Diagnosis not present

## 2020-08-31 DIAGNOSIS — D509 Iron deficiency anemia, unspecified: Secondary | ICD-10-CM | POA: Diagnosis not present

## 2020-08-31 DIAGNOSIS — N186 End stage renal disease: Secondary | ICD-10-CM | POA: Diagnosis not present

## 2020-08-31 DIAGNOSIS — D631 Anemia in chronic kidney disease: Secondary | ICD-10-CM | POA: Diagnosis not present

## 2020-08-31 DIAGNOSIS — N2581 Secondary hyperparathyroidism of renal origin: Secondary | ICD-10-CM | POA: Diagnosis not present

## 2020-09-03 DIAGNOSIS — D509 Iron deficiency anemia, unspecified: Secondary | ICD-10-CM | POA: Diagnosis not present

## 2020-09-03 DIAGNOSIS — E559 Vitamin D deficiency, unspecified: Secondary | ICD-10-CM | POA: Diagnosis not present

## 2020-09-03 DIAGNOSIS — N2581 Secondary hyperparathyroidism of renal origin: Secondary | ICD-10-CM | POA: Diagnosis not present

## 2020-09-03 DIAGNOSIS — Z992 Dependence on renal dialysis: Secondary | ICD-10-CM | POA: Diagnosis not present

## 2020-09-03 DIAGNOSIS — D631 Anemia in chronic kidney disease: Secondary | ICD-10-CM | POA: Diagnosis not present

## 2020-09-03 DIAGNOSIS — N186 End stage renal disease: Secondary | ICD-10-CM | POA: Diagnosis not present

## 2020-09-05 DIAGNOSIS — Z992 Dependence on renal dialysis: Secondary | ICD-10-CM | POA: Diagnosis not present

## 2020-09-05 DIAGNOSIS — Z79899 Other long term (current) drug therapy: Secondary | ICD-10-CM | POA: Diagnosis not present

## 2020-09-05 DIAGNOSIS — D509 Iron deficiency anemia, unspecified: Secondary | ICD-10-CM | POA: Diagnosis not present

## 2020-09-05 DIAGNOSIS — N186 End stage renal disease: Secondary | ICD-10-CM | POA: Diagnosis not present

## 2020-09-05 DIAGNOSIS — F329 Major depressive disorder, single episode, unspecified: Secondary | ICD-10-CM | POA: Diagnosis not present

## 2020-09-05 DIAGNOSIS — E559 Vitamin D deficiency, unspecified: Secondary | ICD-10-CM | POA: Diagnosis not present

## 2020-09-05 DIAGNOSIS — N2581 Secondary hyperparathyroidism of renal origin: Secondary | ICD-10-CM | POA: Diagnosis not present

## 2020-09-05 DIAGNOSIS — D631 Anemia in chronic kidney disease: Secondary | ICD-10-CM | POA: Diagnosis not present

## 2020-09-05 DIAGNOSIS — F419 Anxiety disorder, unspecified: Secondary | ICD-10-CM | POA: Diagnosis not present

## 2020-09-07 DIAGNOSIS — D509 Iron deficiency anemia, unspecified: Secondary | ICD-10-CM | POA: Diagnosis not present

## 2020-09-07 DIAGNOSIS — D631 Anemia in chronic kidney disease: Secondary | ICD-10-CM | POA: Diagnosis not present

## 2020-09-07 DIAGNOSIS — N186 End stage renal disease: Secondary | ICD-10-CM | POA: Diagnosis not present

## 2020-09-07 DIAGNOSIS — Z992 Dependence on renal dialysis: Secondary | ICD-10-CM | POA: Diagnosis not present

## 2020-09-07 DIAGNOSIS — E559 Vitamin D deficiency, unspecified: Secondary | ICD-10-CM | POA: Diagnosis not present

## 2020-09-07 DIAGNOSIS — N2581 Secondary hyperparathyroidism of renal origin: Secondary | ICD-10-CM | POA: Diagnosis not present

## 2020-09-10 DIAGNOSIS — D631 Anemia in chronic kidney disease: Secondary | ICD-10-CM | POA: Diagnosis not present

## 2020-09-10 DIAGNOSIS — N186 End stage renal disease: Secondary | ICD-10-CM | POA: Diagnosis not present

## 2020-09-10 DIAGNOSIS — N2581 Secondary hyperparathyroidism of renal origin: Secondary | ICD-10-CM | POA: Diagnosis not present

## 2020-09-10 DIAGNOSIS — Z992 Dependence on renal dialysis: Secondary | ICD-10-CM | POA: Diagnosis not present

## 2020-09-10 DIAGNOSIS — E559 Vitamin D deficiency, unspecified: Secondary | ICD-10-CM | POA: Diagnosis not present

## 2020-09-10 DIAGNOSIS — D509 Iron deficiency anemia, unspecified: Secondary | ICD-10-CM | POA: Diagnosis not present

## 2020-09-12 DIAGNOSIS — D631 Anemia in chronic kidney disease: Secondary | ICD-10-CM | POA: Diagnosis not present

## 2020-09-12 DIAGNOSIS — Z992 Dependence on renal dialysis: Secondary | ICD-10-CM | POA: Diagnosis not present

## 2020-09-12 DIAGNOSIS — D509 Iron deficiency anemia, unspecified: Secondary | ICD-10-CM | POA: Diagnosis not present

## 2020-09-12 DIAGNOSIS — N186 End stage renal disease: Secondary | ICD-10-CM | POA: Diagnosis not present

## 2020-09-12 DIAGNOSIS — E559 Vitamin D deficiency, unspecified: Secondary | ICD-10-CM | POA: Diagnosis not present

## 2020-09-12 DIAGNOSIS — N2581 Secondary hyperparathyroidism of renal origin: Secondary | ICD-10-CM | POA: Diagnosis not present

## 2020-09-13 ENCOUNTER — Other Ambulatory Visit: Payer: Self-pay

## 2020-09-13 ENCOUNTER — Ambulatory Visit (HOSPITAL_COMMUNITY)
Admission: RE | Admit: 2020-09-13 | Discharge: 2020-09-13 | Disposition: A | Discharge status: Home / Self Care | Payer: Medicare Other | Source: Ambulatory Visit | Attending: Internal Medicine | Admitting: Internal Medicine

## 2020-09-13 DIAGNOSIS — Z992 Dependence on renal dialysis: Secondary | ICD-10-CM | POA: Diagnosis not present

## 2020-09-13 DIAGNOSIS — Z1231 Encounter for screening mammogram for malignant neoplasm of breast: Secondary | ICD-10-CM | POA: Insufficient documentation

## 2020-09-13 DIAGNOSIS — N186 End stage renal disease: Secondary | ICD-10-CM | POA: Diagnosis not present

## 2020-09-14 DIAGNOSIS — E559 Vitamin D deficiency, unspecified: Secondary | ICD-10-CM | POA: Diagnosis not present

## 2020-09-14 DIAGNOSIS — N186 End stage renal disease: Secondary | ICD-10-CM | POA: Diagnosis not present

## 2020-09-14 DIAGNOSIS — D631 Anemia in chronic kidney disease: Secondary | ICD-10-CM | POA: Diagnosis not present

## 2020-09-14 DIAGNOSIS — N2581 Secondary hyperparathyroidism of renal origin: Secondary | ICD-10-CM | POA: Diagnosis not present

## 2020-09-14 DIAGNOSIS — D509 Iron deficiency anemia, unspecified: Secondary | ICD-10-CM | POA: Diagnosis not present

## 2020-09-14 DIAGNOSIS — Z992 Dependence on renal dialysis: Secondary | ICD-10-CM | POA: Diagnosis not present

## 2020-09-17 DIAGNOSIS — Z992 Dependence on renal dialysis: Secondary | ICD-10-CM | POA: Diagnosis not present

## 2020-09-17 DIAGNOSIS — N186 End stage renal disease: Secondary | ICD-10-CM | POA: Diagnosis not present

## 2020-09-17 DIAGNOSIS — E559 Vitamin D deficiency, unspecified: Secondary | ICD-10-CM | POA: Diagnosis not present

## 2020-09-17 DIAGNOSIS — D631 Anemia in chronic kidney disease: Secondary | ICD-10-CM | POA: Diagnosis not present

## 2020-09-17 DIAGNOSIS — D509 Iron deficiency anemia, unspecified: Secondary | ICD-10-CM | POA: Diagnosis not present

## 2020-09-17 DIAGNOSIS — N2581 Secondary hyperparathyroidism of renal origin: Secondary | ICD-10-CM | POA: Diagnosis not present

## 2020-09-19 DIAGNOSIS — D509 Iron deficiency anemia, unspecified: Secondary | ICD-10-CM | POA: Diagnosis not present

## 2020-09-19 DIAGNOSIS — Z992 Dependence on renal dialysis: Secondary | ICD-10-CM | POA: Diagnosis not present

## 2020-09-19 DIAGNOSIS — N186 End stage renal disease: Secondary | ICD-10-CM | POA: Diagnosis not present

## 2020-09-19 DIAGNOSIS — N2581 Secondary hyperparathyroidism of renal origin: Secondary | ICD-10-CM | POA: Diagnosis not present

## 2020-09-19 DIAGNOSIS — D631 Anemia in chronic kidney disease: Secondary | ICD-10-CM | POA: Diagnosis not present

## 2020-09-19 DIAGNOSIS — E559 Vitamin D deficiency, unspecified: Secondary | ICD-10-CM | POA: Diagnosis not present

## 2020-09-21 DIAGNOSIS — N186 End stage renal disease: Secondary | ICD-10-CM | POA: Diagnosis not present

## 2020-09-21 DIAGNOSIS — N2581 Secondary hyperparathyroidism of renal origin: Secondary | ICD-10-CM | POA: Diagnosis not present

## 2020-09-21 DIAGNOSIS — Z992 Dependence on renal dialysis: Secondary | ICD-10-CM | POA: Diagnosis not present

## 2020-09-21 DIAGNOSIS — E559 Vitamin D deficiency, unspecified: Secondary | ICD-10-CM | POA: Diagnosis not present

## 2020-09-21 DIAGNOSIS — D631 Anemia in chronic kidney disease: Secondary | ICD-10-CM | POA: Diagnosis not present

## 2020-09-21 DIAGNOSIS — D509 Iron deficiency anemia, unspecified: Secondary | ICD-10-CM | POA: Diagnosis not present

## 2020-09-24 DIAGNOSIS — D631 Anemia in chronic kidney disease: Secondary | ICD-10-CM | POA: Diagnosis not present

## 2020-09-24 DIAGNOSIS — E119 Type 2 diabetes mellitus without complications: Secondary | ICD-10-CM | POA: Diagnosis not present

## 2020-09-24 DIAGNOSIS — N186 End stage renal disease: Secondary | ICD-10-CM | POA: Diagnosis not present

## 2020-09-24 DIAGNOSIS — D509 Iron deficiency anemia, unspecified: Secondary | ICD-10-CM | POA: Diagnosis not present

## 2020-09-24 DIAGNOSIS — E559 Vitamin D deficiency, unspecified: Secondary | ICD-10-CM | POA: Diagnosis not present

## 2020-09-24 DIAGNOSIS — Z992 Dependence on renal dialysis: Secondary | ICD-10-CM | POA: Diagnosis not present

## 2020-09-24 DIAGNOSIS — N2581 Secondary hyperparathyroidism of renal origin: Secondary | ICD-10-CM | POA: Diagnosis not present

## 2020-09-26 DIAGNOSIS — Z992 Dependence on renal dialysis: Secondary | ICD-10-CM | POA: Diagnosis not present

## 2020-09-26 DIAGNOSIS — D631 Anemia in chronic kidney disease: Secondary | ICD-10-CM | POA: Diagnosis not present

## 2020-09-26 DIAGNOSIS — N186 End stage renal disease: Secondary | ICD-10-CM | POA: Diagnosis not present

## 2020-09-26 DIAGNOSIS — E559 Vitamin D deficiency, unspecified: Secondary | ICD-10-CM | POA: Diagnosis not present

## 2020-09-26 DIAGNOSIS — N2581 Secondary hyperparathyroidism of renal origin: Secondary | ICD-10-CM | POA: Diagnosis not present

## 2020-09-26 DIAGNOSIS — D509 Iron deficiency anemia, unspecified: Secondary | ICD-10-CM | POA: Diagnosis not present

## 2020-09-28 DIAGNOSIS — Z992 Dependence on renal dialysis: Secondary | ICD-10-CM | POA: Diagnosis not present

## 2020-09-28 DIAGNOSIS — N186 End stage renal disease: Secondary | ICD-10-CM | POA: Diagnosis not present

## 2020-09-28 DIAGNOSIS — D631 Anemia in chronic kidney disease: Secondary | ICD-10-CM | POA: Diagnosis not present

## 2020-09-28 DIAGNOSIS — E559 Vitamin D deficiency, unspecified: Secondary | ICD-10-CM | POA: Diagnosis not present

## 2020-09-28 DIAGNOSIS — D509 Iron deficiency anemia, unspecified: Secondary | ICD-10-CM | POA: Diagnosis not present

## 2020-09-28 DIAGNOSIS — N2581 Secondary hyperparathyroidism of renal origin: Secondary | ICD-10-CM | POA: Diagnosis not present

## 2020-10-01 DIAGNOSIS — D631 Anemia in chronic kidney disease: Secondary | ICD-10-CM | POA: Diagnosis not present

## 2020-10-01 DIAGNOSIS — E559 Vitamin D deficiency, unspecified: Secondary | ICD-10-CM | POA: Diagnosis not present

## 2020-10-01 DIAGNOSIS — N2581 Secondary hyperparathyroidism of renal origin: Secondary | ICD-10-CM | POA: Diagnosis not present

## 2020-10-01 DIAGNOSIS — N186 End stage renal disease: Secondary | ICD-10-CM | POA: Diagnosis not present

## 2020-10-01 DIAGNOSIS — D509 Iron deficiency anemia, unspecified: Secondary | ICD-10-CM | POA: Diagnosis not present

## 2020-10-01 DIAGNOSIS — Z992 Dependence on renal dialysis: Secondary | ICD-10-CM | POA: Diagnosis not present

## 2020-10-03 DIAGNOSIS — D509 Iron deficiency anemia, unspecified: Secondary | ICD-10-CM | POA: Diagnosis not present

## 2020-10-03 DIAGNOSIS — N2581 Secondary hyperparathyroidism of renal origin: Secondary | ICD-10-CM | POA: Diagnosis not present

## 2020-10-03 DIAGNOSIS — D631 Anemia in chronic kidney disease: Secondary | ICD-10-CM | POA: Diagnosis not present

## 2020-10-03 DIAGNOSIS — E559 Vitamin D deficiency, unspecified: Secondary | ICD-10-CM | POA: Diagnosis not present

## 2020-10-03 DIAGNOSIS — Z992 Dependence on renal dialysis: Secondary | ICD-10-CM | POA: Diagnosis not present

## 2020-10-03 DIAGNOSIS — N186 End stage renal disease: Secondary | ICD-10-CM | POA: Diagnosis not present

## 2020-10-05 DIAGNOSIS — D631 Anemia in chronic kidney disease: Secondary | ICD-10-CM | POA: Diagnosis not present

## 2020-10-05 DIAGNOSIS — D509 Iron deficiency anemia, unspecified: Secondary | ICD-10-CM | POA: Diagnosis not present

## 2020-10-05 DIAGNOSIS — N2581 Secondary hyperparathyroidism of renal origin: Secondary | ICD-10-CM | POA: Diagnosis not present

## 2020-10-05 DIAGNOSIS — Z992 Dependence on renal dialysis: Secondary | ICD-10-CM | POA: Diagnosis not present

## 2020-10-05 DIAGNOSIS — E559 Vitamin D deficiency, unspecified: Secondary | ICD-10-CM | POA: Diagnosis not present

## 2020-10-05 DIAGNOSIS — N186 End stage renal disease: Secondary | ICD-10-CM | POA: Diagnosis not present

## 2020-10-08 DIAGNOSIS — N186 End stage renal disease: Secondary | ICD-10-CM | POA: Diagnosis not present

## 2020-10-08 DIAGNOSIS — Z992 Dependence on renal dialysis: Secondary | ICD-10-CM | POA: Diagnosis not present

## 2020-10-08 DIAGNOSIS — N2581 Secondary hyperparathyroidism of renal origin: Secondary | ICD-10-CM | POA: Diagnosis not present

## 2020-10-08 DIAGNOSIS — D509 Iron deficiency anemia, unspecified: Secondary | ICD-10-CM | POA: Diagnosis not present

## 2020-10-08 DIAGNOSIS — E559 Vitamin D deficiency, unspecified: Secondary | ICD-10-CM | POA: Diagnosis not present

## 2020-10-08 DIAGNOSIS — D631 Anemia in chronic kidney disease: Secondary | ICD-10-CM | POA: Diagnosis not present

## 2020-10-10 DIAGNOSIS — F419 Anxiety disorder, unspecified: Secondary | ICD-10-CM | POA: Diagnosis not present

## 2020-10-10 DIAGNOSIS — E559 Vitamin D deficiency, unspecified: Secondary | ICD-10-CM | POA: Diagnosis not present

## 2020-10-10 DIAGNOSIS — Z992 Dependence on renal dialysis: Secondary | ICD-10-CM | POA: Diagnosis not present

## 2020-10-10 DIAGNOSIS — Z79899 Other long term (current) drug therapy: Secondary | ICD-10-CM | POA: Diagnosis not present

## 2020-10-10 DIAGNOSIS — N186 End stage renal disease: Secondary | ICD-10-CM | POA: Diagnosis not present

## 2020-10-10 DIAGNOSIS — D631 Anemia in chronic kidney disease: Secondary | ICD-10-CM | POA: Diagnosis not present

## 2020-10-10 DIAGNOSIS — N2581 Secondary hyperparathyroidism of renal origin: Secondary | ICD-10-CM | POA: Diagnosis not present

## 2020-10-10 DIAGNOSIS — D509 Iron deficiency anemia, unspecified: Secondary | ICD-10-CM | POA: Diagnosis not present

## 2020-10-10 DIAGNOSIS — F329 Major depressive disorder, single episode, unspecified: Secondary | ICD-10-CM | POA: Diagnosis not present

## 2020-10-12 DIAGNOSIS — N2581 Secondary hyperparathyroidism of renal origin: Secondary | ICD-10-CM | POA: Diagnosis not present

## 2020-10-12 DIAGNOSIS — Z992 Dependence on renal dialysis: Secondary | ICD-10-CM | POA: Diagnosis not present

## 2020-10-12 DIAGNOSIS — D631 Anemia in chronic kidney disease: Secondary | ICD-10-CM | POA: Diagnosis not present

## 2020-10-12 DIAGNOSIS — D509 Iron deficiency anemia, unspecified: Secondary | ICD-10-CM | POA: Diagnosis not present

## 2020-10-12 DIAGNOSIS — E559 Vitamin D deficiency, unspecified: Secondary | ICD-10-CM | POA: Diagnosis not present

## 2020-10-12 DIAGNOSIS — N186 End stage renal disease: Secondary | ICD-10-CM | POA: Diagnosis not present

## 2020-10-14 DIAGNOSIS — N186 End stage renal disease: Secondary | ICD-10-CM | POA: Diagnosis not present

## 2020-10-14 DIAGNOSIS — Z992 Dependence on renal dialysis: Secondary | ICD-10-CM | POA: Diagnosis not present

## 2020-10-15 DIAGNOSIS — Z992 Dependence on renal dialysis: Secondary | ICD-10-CM | POA: Diagnosis not present

## 2020-10-15 DIAGNOSIS — N2581 Secondary hyperparathyroidism of renal origin: Secondary | ICD-10-CM | POA: Diagnosis not present

## 2020-10-15 DIAGNOSIS — D509 Iron deficiency anemia, unspecified: Secondary | ICD-10-CM | POA: Diagnosis not present

## 2020-10-15 DIAGNOSIS — N186 End stage renal disease: Secondary | ICD-10-CM | POA: Diagnosis not present

## 2020-10-15 DIAGNOSIS — D631 Anemia in chronic kidney disease: Secondary | ICD-10-CM | POA: Diagnosis not present

## 2020-10-17 DIAGNOSIS — N186 End stage renal disease: Secondary | ICD-10-CM | POA: Diagnosis not present

## 2020-10-17 DIAGNOSIS — N2581 Secondary hyperparathyroidism of renal origin: Secondary | ICD-10-CM | POA: Diagnosis not present

## 2020-10-17 DIAGNOSIS — D631 Anemia in chronic kidney disease: Secondary | ICD-10-CM | POA: Diagnosis not present

## 2020-10-17 DIAGNOSIS — D509 Iron deficiency anemia, unspecified: Secondary | ICD-10-CM | POA: Diagnosis not present

## 2020-10-17 DIAGNOSIS — Z992 Dependence on renal dialysis: Secondary | ICD-10-CM | POA: Diagnosis not present

## 2020-10-19 DIAGNOSIS — N186 End stage renal disease: Secondary | ICD-10-CM | POA: Diagnosis not present

## 2020-10-19 DIAGNOSIS — D631 Anemia in chronic kidney disease: Secondary | ICD-10-CM | POA: Diagnosis not present

## 2020-10-19 DIAGNOSIS — D509 Iron deficiency anemia, unspecified: Secondary | ICD-10-CM | POA: Diagnosis not present

## 2020-10-19 DIAGNOSIS — N2581 Secondary hyperparathyroidism of renal origin: Secondary | ICD-10-CM | POA: Diagnosis not present

## 2020-10-19 DIAGNOSIS — Z992 Dependence on renal dialysis: Secondary | ICD-10-CM | POA: Diagnosis not present

## 2020-10-22 DIAGNOSIS — D631 Anemia in chronic kidney disease: Secondary | ICD-10-CM | POA: Diagnosis not present

## 2020-10-22 DIAGNOSIS — Z992 Dependence on renal dialysis: Secondary | ICD-10-CM | POA: Diagnosis not present

## 2020-10-22 DIAGNOSIS — D509 Iron deficiency anemia, unspecified: Secondary | ICD-10-CM | POA: Diagnosis not present

## 2020-10-22 DIAGNOSIS — N186 End stage renal disease: Secondary | ICD-10-CM | POA: Diagnosis not present

## 2020-10-22 DIAGNOSIS — N2581 Secondary hyperparathyroidism of renal origin: Secondary | ICD-10-CM | POA: Diagnosis not present

## 2020-10-24 DIAGNOSIS — D509 Iron deficiency anemia, unspecified: Secondary | ICD-10-CM | POA: Diagnosis not present

## 2020-10-24 DIAGNOSIS — D631 Anemia in chronic kidney disease: Secondary | ICD-10-CM | POA: Diagnosis not present

## 2020-10-24 DIAGNOSIS — N2581 Secondary hyperparathyroidism of renal origin: Secondary | ICD-10-CM | POA: Diagnosis not present

## 2020-10-24 DIAGNOSIS — Z992 Dependence on renal dialysis: Secondary | ICD-10-CM | POA: Diagnosis not present

## 2020-10-24 DIAGNOSIS — N186 End stage renal disease: Secondary | ICD-10-CM | POA: Diagnosis not present

## 2020-10-26 DIAGNOSIS — Z992 Dependence on renal dialysis: Secondary | ICD-10-CM | POA: Diagnosis not present

## 2020-10-26 DIAGNOSIS — N2581 Secondary hyperparathyroidism of renal origin: Secondary | ICD-10-CM | POA: Diagnosis not present

## 2020-10-26 DIAGNOSIS — N186 End stage renal disease: Secondary | ICD-10-CM | POA: Diagnosis not present

## 2020-10-26 DIAGNOSIS — D509 Iron deficiency anemia, unspecified: Secondary | ICD-10-CM | POA: Diagnosis not present

## 2020-10-26 DIAGNOSIS — D631 Anemia in chronic kidney disease: Secondary | ICD-10-CM | POA: Diagnosis not present

## 2020-10-29 DIAGNOSIS — D509 Iron deficiency anemia, unspecified: Secondary | ICD-10-CM | POA: Diagnosis not present

## 2020-10-29 DIAGNOSIS — N186 End stage renal disease: Secondary | ICD-10-CM | POA: Diagnosis not present

## 2020-10-29 DIAGNOSIS — D631 Anemia in chronic kidney disease: Secondary | ICD-10-CM | POA: Diagnosis not present

## 2020-10-29 DIAGNOSIS — N2581 Secondary hyperparathyroidism of renal origin: Secondary | ICD-10-CM | POA: Diagnosis not present

## 2020-10-29 DIAGNOSIS — Z992 Dependence on renal dialysis: Secondary | ICD-10-CM | POA: Diagnosis not present

## 2020-10-31 DIAGNOSIS — D509 Iron deficiency anemia, unspecified: Secondary | ICD-10-CM | POA: Diagnosis not present

## 2020-10-31 DIAGNOSIS — Z992 Dependence on renal dialysis: Secondary | ICD-10-CM | POA: Diagnosis not present

## 2020-10-31 DIAGNOSIS — N186 End stage renal disease: Secondary | ICD-10-CM | POA: Diagnosis not present

## 2020-10-31 DIAGNOSIS — N2581 Secondary hyperparathyroidism of renal origin: Secondary | ICD-10-CM | POA: Diagnosis not present

## 2020-10-31 DIAGNOSIS — D631 Anemia in chronic kidney disease: Secondary | ICD-10-CM | POA: Diagnosis not present

## 2020-11-02 DIAGNOSIS — D509 Iron deficiency anemia, unspecified: Secondary | ICD-10-CM | POA: Diagnosis not present

## 2020-11-02 DIAGNOSIS — N186 End stage renal disease: Secondary | ICD-10-CM | POA: Diagnosis not present

## 2020-11-02 DIAGNOSIS — N2581 Secondary hyperparathyroidism of renal origin: Secondary | ICD-10-CM | POA: Diagnosis not present

## 2020-11-02 DIAGNOSIS — Z992 Dependence on renal dialysis: Secondary | ICD-10-CM | POA: Diagnosis not present

## 2020-11-02 DIAGNOSIS — D631 Anemia in chronic kidney disease: Secondary | ICD-10-CM | POA: Diagnosis not present

## 2020-11-05 DIAGNOSIS — N2581 Secondary hyperparathyroidism of renal origin: Secondary | ICD-10-CM | POA: Diagnosis not present

## 2020-11-05 DIAGNOSIS — N186 End stage renal disease: Secondary | ICD-10-CM | POA: Diagnosis not present

## 2020-11-05 DIAGNOSIS — D509 Iron deficiency anemia, unspecified: Secondary | ICD-10-CM | POA: Diagnosis not present

## 2020-11-05 DIAGNOSIS — D631 Anemia in chronic kidney disease: Secondary | ICD-10-CM | POA: Diagnosis not present

## 2020-11-05 DIAGNOSIS — Z992 Dependence on renal dialysis: Secondary | ICD-10-CM | POA: Diagnosis not present

## 2020-11-07 DIAGNOSIS — Z992 Dependence on renal dialysis: Secondary | ICD-10-CM | POA: Diagnosis not present

## 2020-11-07 DIAGNOSIS — D631 Anemia in chronic kidney disease: Secondary | ICD-10-CM | POA: Diagnosis not present

## 2020-11-07 DIAGNOSIS — D509 Iron deficiency anemia, unspecified: Secondary | ICD-10-CM | POA: Diagnosis not present

## 2020-11-07 DIAGNOSIS — F329 Major depressive disorder, single episode, unspecified: Secondary | ICD-10-CM | POA: Diagnosis not present

## 2020-11-07 DIAGNOSIS — N2581 Secondary hyperparathyroidism of renal origin: Secondary | ICD-10-CM | POA: Diagnosis not present

## 2020-11-07 DIAGNOSIS — N186 End stage renal disease: Secondary | ICD-10-CM | POA: Diagnosis not present

## 2020-11-07 DIAGNOSIS — F419 Anxiety disorder, unspecified: Secondary | ICD-10-CM | POA: Diagnosis not present

## 2020-11-07 DIAGNOSIS — Z79899 Other long term (current) drug therapy: Secondary | ICD-10-CM | POA: Diagnosis not present

## 2020-11-09 DIAGNOSIS — N186 End stage renal disease: Secondary | ICD-10-CM | POA: Diagnosis not present

## 2020-11-09 DIAGNOSIS — N2581 Secondary hyperparathyroidism of renal origin: Secondary | ICD-10-CM | POA: Diagnosis not present

## 2020-11-09 DIAGNOSIS — D509 Iron deficiency anemia, unspecified: Secondary | ICD-10-CM | POA: Diagnosis not present

## 2020-11-09 DIAGNOSIS — D631 Anemia in chronic kidney disease: Secondary | ICD-10-CM | POA: Diagnosis not present

## 2020-11-09 DIAGNOSIS — Z992 Dependence on renal dialysis: Secondary | ICD-10-CM | POA: Diagnosis not present

## 2020-11-12 DIAGNOSIS — N186 End stage renal disease: Secondary | ICD-10-CM | POA: Diagnosis not present

## 2020-11-12 DIAGNOSIS — Z992 Dependence on renal dialysis: Secondary | ICD-10-CM | POA: Diagnosis not present

## 2020-11-12 DIAGNOSIS — N2581 Secondary hyperparathyroidism of renal origin: Secondary | ICD-10-CM | POA: Diagnosis not present

## 2020-11-12 DIAGNOSIS — D509 Iron deficiency anemia, unspecified: Secondary | ICD-10-CM | POA: Diagnosis not present

## 2020-11-12 DIAGNOSIS — D631 Anemia in chronic kidney disease: Secondary | ICD-10-CM | POA: Diagnosis not present

## 2020-11-14 DIAGNOSIS — N2581 Secondary hyperparathyroidism of renal origin: Secondary | ICD-10-CM | POA: Diagnosis not present

## 2020-11-14 DIAGNOSIS — D631 Anemia in chronic kidney disease: Secondary | ICD-10-CM | POA: Diagnosis not present

## 2020-11-14 DIAGNOSIS — N186 End stage renal disease: Secondary | ICD-10-CM | POA: Diagnosis not present

## 2020-11-14 DIAGNOSIS — D509 Iron deficiency anemia, unspecified: Secondary | ICD-10-CM | POA: Diagnosis not present

## 2020-11-14 DIAGNOSIS — Z992 Dependence on renal dialysis: Secondary | ICD-10-CM | POA: Diagnosis not present

## 2020-11-16 DIAGNOSIS — N186 End stage renal disease: Secondary | ICD-10-CM | POA: Diagnosis not present

## 2020-11-16 DIAGNOSIS — D631 Anemia in chronic kidney disease: Secondary | ICD-10-CM | POA: Diagnosis not present

## 2020-11-16 DIAGNOSIS — D509 Iron deficiency anemia, unspecified: Secondary | ICD-10-CM | POA: Diagnosis not present

## 2020-11-16 DIAGNOSIS — Z992 Dependence on renal dialysis: Secondary | ICD-10-CM | POA: Diagnosis not present

## 2020-11-16 DIAGNOSIS — N2581 Secondary hyperparathyroidism of renal origin: Secondary | ICD-10-CM | POA: Diagnosis not present

## 2020-11-19 DIAGNOSIS — N186 End stage renal disease: Secondary | ICD-10-CM | POA: Diagnosis not present

## 2020-11-19 DIAGNOSIS — D631 Anemia in chronic kidney disease: Secondary | ICD-10-CM | POA: Diagnosis not present

## 2020-11-19 DIAGNOSIS — N2581 Secondary hyperparathyroidism of renal origin: Secondary | ICD-10-CM | POA: Diagnosis not present

## 2020-11-19 DIAGNOSIS — D509 Iron deficiency anemia, unspecified: Secondary | ICD-10-CM | POA: Diagnosis not present

## 2020-11-19 DIAGNOSIS — Z992 Dependence on renal dialysis: Secondary | ICD-10-CM | POA: Diagnosis not present

## 2020-11-21 DIAGNOSIS — D509 Iron deficiency anemia, unspecified: Secondary | ICD-10-CM | POA: Diagnosis not present

## 2020-11-21 DIAGNOSIS — Z992 Dependence on renal dialysis: Secondary | ICD-10-CM | POA: Diagnosis not present

## 2020-11-21 DIAGNOSIS — N2581 Secondary hyperparathyroidism of renal origin: Secondary | ICD-10-CM | POA: Diagnosis not present

## 2020-11-21 DIAGNOSIS — D631 Anemia in chronic kidney disease: Secondary | ICD-10-CM | POA: Diagnosis not present

## 2020-11-21 DIAGNOSIS — N186 End stage renal disease: Secondary | ICD-10-CM | POA: Diagnosis not present

## 2020-11-22 DIAGNOSIS — K219 Gastro-esophageal reflux disease without esophagitis: Secondary | ICD-10-CM | POA: Diagnosis not present

## 2020-11-22 DIAGNOSIS — M329 Systemic lupus erythematosus, unspecified: Secondary | ICD-10-CM | POA: Diagnosis not present

## 2020-11-22 DIAGNOSIS — N186 End stage renal disease: Secondary | ICD-10-CM | POA: Diagnosis not present

## 2020-11-22 DIAGNOSIS — F411 Generalized anxiety disorder: Secondary | ICD-10-CM | POA: Diagnosis not present

## 2020-11-23 DIAGNOSIS — D631 Anemia in chronic kidney disease: Secondary | ICD-10-CM | POA: Diagnosis not present

## 2020-11-23 DIAGNOSIS — N2581 Secondary hyperparathyroidism of renal origin: Secondary | ICD-10-CM | POA: Diagnosis not present

## 2020-11-23 DIAGNOSIS — D509 Iron deficiency anemia, unspecified: Secondary | ICD-10-CM | POA: Diagnosis not present

## 2020-11-23 DIAGNOSIS — Z992 Dependence on renal dialysis: Secondary | ICD-10-CM | POA: Diagnosis not present

## 2020-11-23 DIAGNOSIS — N186 End stage renal disease: Secondary | ICD-10-CM | POA: Diagnosis not present

## 2020-11-26 DIAGNOSIS — N186 End stage renal disease: Secondary | ICD-10-CM | POA: Diagnosis not present

## 2020-11-26 DIAGNOSIS — D631 Anemia in chronic kidney disease: Secondary | ICD-10-CM | POA: Diagnosis not present

## 2020-11-26 DIAGNOSIS — D509 Iron deficiency anemia, unspecified: Secondary | ICD-10-CM | POA: Diagnosis not present

## 2020-11-26 DIAGNOSIS — Z992 Dependence on renal dialysis: Secondary | ICD-10-CM | POA: Diagnosis not present

## 2020-11-26 DIAGNOSIS — N2581 Secondary hyperparathyroidism of renal origin: Secondary | ICD-10-CM | POA: Diagnosis not present

## 2020-11-28 DIAGNOSIS — D509 Iron deficiency anemia, unspecified: Secondary | ICD-10-CM | POA: Diagnosis not present

## 2020-11-28 DIAGNOSIS — Z992 Dependence on renal dialysis: Secondary | ICD-10-CM | POA: Diagnosis not present

## 2020-11-28 DIAGNOSIS — D631 Anemia in chronic kidney disease: Secondary | ICD-10-CM | POA: Diagnosis not present

## 2020-11-28 DIAGNOSIS — N2581 Secondary hyperparathyroidism of renal origin: Secondary | ICD-10-CM | POA: Diagnosis not present

## 2020-11-28 DIAGNOSIS — N186 End stage renal disease: Secondary | ICD-10-CM | POA: Diagnosis not present

## 2020-11-30 DIAGNOSIS — N2581 Secondary hyperparathyroidism of renal origin: Secondary | ICD-10-CM | POA: Diagnosis not present

## 2020-11-30 DIAGNOSIS — D509 Iron deficiency anemia, unspecified: Secondary | ICD-10-CM | POA: Diagnosis not present

## 2020-11-30 DIAGNOSIS — N186 End stage renal disease: Secondary | ICD-10-CM | POA: Diagnosis not present

## 2020-11-30 DIAGNOSIS — Z992 Dependence on renal dialysis: Secondary | ICD-10-CM | POA: Diagnosis not present

## 2020-11-30 DIAGNOSIS — D631 Anemia in chronic kidney disease: Secondary | ICD-10-CM | POA: Diagnosis not present

## 2020-12-03 DIAGNOSIS — D509 Iron deficiency anemia, unspecified: Secondary | ICD-10-CM | POA: Diagnosis not present

## 2020-12-03 DIAGNOSIS — N2581 Secondary hyperparathyroidism of renal origin: Secondary | ICD-10-CM | POA: Diagnosis not present

## 2020-12-03 DIAGNOSIS — N186 End stage renal disease: Secondary | ICD-10-CM | POA: Diagnosis not present

## 2020-12-03 DIAGNOSIS — Z992 Dependence on renal dialysis: Secondary | ICD-10-CM | POA: Diagnosis not present

## 2020-12-03 DIAGNOSIS — D631 Anemia in chronic kidney disease: Secondary | ICD-10-CM | POA: Diagnosis not present

## 2020-12-05 DIAGNOSIS — N186 End stage renal disease: Secondary | ICD-10-CM | POA: Diagnosis not present

## 2020-12-05 DIAGNOSIS — D631 Anemia in chronic kidney disease: Secondary | ICD-10-CM | POA: Diagnosis not present

## 2020-12-05 DIAGNOSIS — D509 Iron deficiency anemia, unspecified: Secondary | ICD-10-CM | POA: Diagnosis not present

## 2020-12-05 DIAGNOSIS — Z992 Dependence on renal dialysis: Secondary | ICD-10-CM | POA: Diagnosis not present

## 2020-12-05 DIAGNOSIS — N2581 Secondary hyperparathyroidism of renal origin: Secondary | ICD-10-CM | POA: Diagnosis not present

## 2020-12-07 DIAGNOSIS — D631 Anemia in chronic kidney disease: Secondary | ICD-10-CM | POA: Diagnosis not present

## 2020-12-07 DIAGNOSIS — Z992 Dependence on renal dialysis: Secondary | ICD-10-CM | POA: Diagnosis not present

## 2020-12-07 DIAGNOSIS — N2581 Secondary hyperparathyroidism of renal origin: Secondary | ICD-10-CM | POA: Diagnosis not present

## 2020-12-07 DIAGNOSIS — N186 End stage renal disease: Secondary | ICD-10-CM | POA: Diagnosis not present

## 2020-12-07 DIAGNOSIS — D509 Iron deficiency anemia, unspecified: Secondary | ICD-10-CM | POA: Diagnosis not present

## 2020-12-10 DIAGNOSIS — N186 End stage renal disease: Secondary | ICD-10-CM | POA: Diagnosis not present

## 2020-12-10 DIAGNOSIS — D631 Anemia in chronic kidney disease: Secondary | ICD-10-CM | POA: Diagnosis not present

## 2020-12-10 DIAGNOSIS — Z992 Dependence on renal dialysis: Secondary | ICD-10-CM | POA: Diagnosis not present

## 2020-12-10 DIAGNOSIS — D509 Iron deficiency anemia, unspecified: Secondary | ICD-10-CM | POA: Diagnosis not present

## 2020-12-10 DIAGNOSIS — N2581 Secondary hyperparathyroidism of renal origin: Secondary | ICD-10-CM | POA: Diagnosis not present

## 2020-12-12 DIAGNOSIS — D631 Anemia in chronic kidney disease: Secondary | ICD-10-CM | POA: Diagnosis not present

## 2020-12-12 DIAGNOSIS — Z992 Dependence on renal dialysis: Secondary | ICD-10-CM | POA: Diagnosis not present

## 2020-12-12 DIAGNOSIS — D509 Iron deficiency anemia, unspecified: Secondary | ICD-10-CM | POA: Diagnosis not present

## 2020-12-12 DIAGNOSIS — F329 Major depressive disorder, single episode, unspecified: Secondary | ICD-10-CM | POA: Diagnosis not present

## 2020-12-12 DIAGNOSIS — N2581 Secondary hyperparathyroidism of renal origin: Secondary | ICD-10-CM | POA: Diagnosis not present

## 2020-12-12 DIAGNOSIS — F419 Anxiety disorder, unspecified: Secondary | ICD-10-CM | POA: Diagnosis not present

## 2020-12-12 DIAGNOSIS — N186 End stage renal disease: Secondary | ICD-10-CM | POA: Diagnosis not present

## 2020-12-14 DIAGNOSIS — N2581 Secondary hyperparathyroidism of renal origin: Secondary | ICD-10-CM | POA: Diagnosis not present

## 2020-12-14 DIAGNOSIS — N186 End stage renal disease: Secondary | ICD-10-CM | POA: Diagnosis not present

## 2020-12-14 DIAGNOSIS — D631 Anemia in chronic kidney disease: Secondary | ICD-10-CM | POA: Diagnosis not present

## 2020-12-14 DIAGNOSIS — D509 Iron deficiency anemia, unspecified: Secondary | ICD-10-CM | POA: Diagnosis not present

## 2020-12-14 DIAGNOSIS — Z992 Dependence on renal dialysis: Secondary | ICD-10-CM | POA: Diagnosis not present

## 2020-12-17 DIAGNOSIS — Z992 Dependence on renal dialysis: Secondary | ICD-10-CM | POA: Diagnosis not present

## 2020-12-17 DIAGNOSIS — N186 End stage renal disease: Secondary | ICD-10-CM | POA: Diagnosis not present

## 2020-12-17 DIAGNOSIS — N2581 Secondary hyperparathyroidism of renal origin: Secondary | ICD-10-CM | POA: Diagnosis not present

## 2020-12-17 DIAGNOSIS — D509 Iron deficiency anemia, unspecified: Secondary | ICD-10-CM | POA: Diagnosis not present

## 2020-12-19 DIAGNOSIS — D509 Iron deficiency anemia, unspecified: Secondary | ICD-10-CM | POA: Diagnosis not present

## 2020-12-19 DIAGNOSIS — Z992 Dependence on renal dialysis: Secondary | ICD-10-CM | POA: Diagnosis not present

## 2020-12-19 DIAGNOSIS — N2581 Secondary hyperparathyroidism of renal origin: Secondary | ICD-10-CM | POA: Diagnosis not present

## 2020-12-19 DIAGNOSIS — N186 End stage renal disease: Secondary | ICD-10-CM | POA: Diagnosis not present

## 2020-12-21 DIAGNOSIS — D509 Iron deficiency anemia, unspecified: Secondary | ICD-10-CM | POA: Diagnosis not present

## 2020-12-21 DIAGNOSIS — N2581 Secondary hyperparathyroidism of renal origin: Secondary | ICD-10-CM | POA: Diagnosis not present

## 2020-12-21 DIAGNOSIS — N186 End stage renal disease: Secondary | ICD-10-CM | POA: Diagnosis not present

## 2020-12-21 DIAGNOSIS — Z992 Dependence on renal dialysis: Secondary | ICD-10-CM | POA: Diagnosis not present

## 2020-12-24 DIAGNOSIS — Z992 Dependence on renal dialysis: Secondary | ICD-10-CM | POA: Diagnosis not present

## 2020-12-24 DIAGNOSIS — N2581 Secondary hyperparathyroidism of renal origin: Secondary | ICD-10-CM | POA: Diagnosis not present

## 2020-12-24 DIAGNOSIS — N186 End stage renal disease: Secondary | ICD-10-CM | POA: Diagnosis not present

## 2020-12-24 DIAGNOSIS — E119 Type 2 diabetes mellitus without complications: Secondary | ICD-10-CM | POA: Diagnosis not present

## 2020-12-24 DIAGNOSIS — D509 Iron deficiency anemia, unspecified: Secondary | ICD-10-CM | POA: Diagnosis not present

## 2020-12-26 DIAGNOSIS — D509 Iron deficiency anemia, unspecified: Secondary | ICD-10-CM | POA: Diagnosis not present

## 2020-12-26 DIAGNOSIS — N2581 Secondary hyperparathyroidism of renal origin: Secondary | ICD-10-CM | POA: Diagnosis not present

## 2020-12-26 DIAGNOSIS — Z992 Dependence on renal dialysis: Secondary | ICD-10-CM | POA: Diagnosis not present

## 2020-12-26 DIAGNOSIS — N186 End stage renal disease: Secondary | ICD-10-CM | POA: Diagnosis not present

## 2020-12-28 DIAGNOSIS — Z992 Dependence on renal dialysis: Secondary | ICD-10-CM | POA: Diagnosis not present

## 2020-12-28 DIAGNOSIS — N186 End stage renal disease: Secondary | ICD-10-CM | POA: Diagnosis not present

## 2020-12-28 DIAGNOSIS — N2581 Secondary hyperparathyroidism of renal origin: Secondary | ICD-10-CM | POA: Diagnosis not present

## 2020-12-28 DIAGNOSIS — D509 Iron deficiency anemia, unspecified: Secondary | ICD-10-CM | POA: Diagnosis not present

## 2020-12-31 DIAGNOSIS — D509 Iron deficiency anemia, unspecified: Secondary | ICD-10-CM | POA: Diagnosis not present

## 2020-12-31 DIAGNOSIS — Z992 Dependence on renal dialysis: Secondary | ICD-10-CM | POA: Diagnosis not present

## 2020-12-31 DIAGNOSIS — N2581 Secondary hyperparathyroidism of renal origin: Secondary | ICD-10-CM | POA: Diagnosis not present

## 2020-12-31 DIAGNOSIS — N186 End stage renal disease: Secondary | ICD-10-CM | POA: Diagnosis not present

## 2021-01-02 DIAGNOSIS — Z992 Dependence on renal dialysis: Secondary | ICD-10-CM | POA: Diagnosis not present

## 2021-01-02 DIAGNOSIS — D509 Iron deficiency anemia, unspecified: Secondary | ICD-10-CM | POA: Diagnosis not present

## 2021-01-02 DIAGNOSIS — N186 End stage renal disease: Secondary | ICD-10-CM | POA: Diagnosis not present

## 2021-01-02 DIAGNOSIS — N2581 Secondary hyperparathyroidism of renal origin: Secondary | ICD-10-CM | POA: Diagnosis not present

## 2021-01-04 DIAGNOSIS — N2581 Secondary hyperparathyroidism of renal origin: Secondary | ICD-10-CM | POA: Diagnosis not present

## 2021-01-04 DIAGNOSIS — D509 Iron deficiency anemia, unspecified: Secondary | ICD-10-CM | POA: Diagnosis not present

## 2021-01-04 DIAGNOSIS — Z992 Dependence on renal dialysis: Secondary | ICD-10-CM | POA: Diagnosis not present

## 2021-01-04 DIAGNOSIS — N186 End stage renal disease: Secondary | ICD-10-CM | POA: Diagnosis not present

## 2021-01-07 DIAGNOSIS — Z992 Dependence on renal dialysis: Secondary | ICD-10-CM | POA: Diagnosis not present

## 2021-01-07 DIAGNOSIS — D509 Iron deficiency anemia, unspecified: Secondary | ICD-10-CM | POA: Diagnosis not present

## 2021-01-07 DIAGNOSIS — N186 End stage renal disease: Secondary | ICD-10-CM | POA: Diagnosis not present

## 2021-01-07 DIAGNOSIS — N2581 Secondary hyperparathyroidism of renal origin: Secondary | ICD-10-CM | POA: Diagnosis not present

## 2021-01-08 ENCOUNTER — Ambulatory Visit (INDEPENDENT_AMBULATORY_CARE_PROVIDER_SITE_OTHER): Payer: Medicare Other | Admitting: Nurse Practitioner

## 2021-01-08 ENCOUNTER — Encounter (INDEPENDENT_AMBULATORY_CARE_PROVIDER_SITE_OTHER): Payer: Medicare Other

## 2021-01-09 DIAGNOSIS — Z79899 Other long term (current) drug therapy: Secondary | ICD-10-CM | POA: Diagnosis not present

## 2021-01-09 DIAGNOSIS — F419 Anxiety disorder, unspecified: Secondary | ICD-10-CM | POA: Diagnosis not present

## 2021-01-09 DIAGNOSIS — D509 Iron deficiency anemia, unspecified: Secondary | ICD-10-CM | POA: Diagnosis not present

## 2021-01-09 DIAGNOSIS — N2581 Secondary hyperparathyroidism of renal origin: Secondary | ICD-10-CM | POA: Diagnosis not present

## 2021-01-09 DIAGNOSIS — Z992 Dependence on renal dialysis: Secondary | ICD-10-CM | POA: Diagnosis not present

## 2021-01-09 DIAGNOSIS — N186 End stage renal disease: Secondary | ICD-10-CM | POA: Diagnosis not present

## 2021-01-09 DIAGNOSIS — F329 Major depressive disorder, single episode, unspecified: Secondary | ICD-10-CM | POA: Diagnosis not present

## 2021-01-11 DIAGNOSIS — D509 Iron deficiency anemia, unspecified: Secondary | ICD-10-CM | POA: Diagnosis not present

## 2021-01-11 DIAGNOSIS — N186 End stage renal disease: Secondary | ICD-10-CM | POA: Diagnosis not present

## 2021-01-11 DIAGNOSIS — N2581 Secondary hyperparathyroidism of renal origin: Secondary | ICD-10-CM | POA: Diagnosis not present

## 2021-01-11 DIAGNOSIS — Z992 Dependence on renal dialysis: Secondary | ICD-10-CM | POA: Diagnosis not present

## 2021-01-14 DIAGNOSIS — D509 Iron deficiency anemia, unspecified: Secondary | ICD-10-CM | POA: Diagnosis not present

## 2021-01-14 DIAGNOSIS — N2581 Secondary hyperparathyroidism of renal origin: Secondary | ICD-10-CM | POA: Diagnosis not present

## 2021-01-14 DIAGNOSIS — N186 End stage renal disease: Secondary | ICD-10-CM | POA: Diagnosis not present

## 2021-01-14 DIAGNOSIS — Z992 Dependence on renal dialysis: Secondary | ICD-10-CM | POA: Diagnosis not present

## 2021-01-16 DIAGNOSIS — Z992 Dependence on renal dialysis: Secondary | ICD-10-CM | POA: Diagnosis not present

## 2021-01-16 DIAGNOSIS — N186 End stage renal disease: Secondary | ICD-10-CM | POA: Diagnosis not present

## 2021-01-16 DIAGNOSIS — D509 Iron deficiency anemia, unspecified: Secondary | ICD-10-CM | POA: Diagnosis not present

## 2021-01-16 DIAGNOSIS — N2581 Secondary hyperparathyroidism of renal origin: Secondary | ICD-10-CM | POA: Diagnosis not present

## 2021-01-18 DIAGNOSIS — N2581 Secondary hyperparathyroidism of renal origin: Secondary | ICD-10-CM | POA: Diagnosis not present

## 2021-01-18 DIAGNOSIS — N186 End stage renal disease: Secondary | ICD-10-CM | POA: Diagnosis not present

## 2021-01-18 DIAGNOSIS — Z992 Dependence on renal dialysis: Secondary | ICD-10-CM | POA: Diagnosis not present

## 2021-01-18 DIAGNOSIS — D509 Iron deficiency anemia, unspecified: Secondary | ICD-10-CM | POA: Diagnosis not present

## 2021-01-21 DIAGNOSIS — N186 End stage renal disease: Secondary | ICD-10-CM | POA: Diagnosis not present

## 2021-01-21 DIAGNOSIS — N2581 Secondary hyperparathyroidism of renal origin: Secondary | ICD-10-CM | POA: Diagnosis not present

## 2021-01-21 DIAGNOSIS — D509 Iron deficiency anemia, unspecified: Secondary | ICD-10-CM | POA: Diagnosis not present

## 2021-01-21 DIAGNOSIS — Z992 Dependence on renal dialysis: Secondary | ICD-10-CM | POA: Diagnosis not present

## 2021-01-23 DIAGNOSIS — N2581 Secondary hyperparathyroidism of renal origin: Secondary | ICD-10-CM | POA: Diagnosis not present

## 2021-01-23 DIAGNOSIS — D509 Iron deficiency anemia, unspecified: Secondary | ICD-10-CM | POA: Diagnosis not present

## 2021-01-23 DIAGNOSIS — N186 End stage renal disease: Secondary | ICD-10-CM | POA: Diagnosis not present

## 2021-01-23 DIAGNOSIS — Z992 Dependence on renal dialysis: Secondary | ICD-10-CM | POA: Diagnosis not present

## 2021-01-25 DIAGNOSIS — N2581 Secondary hyperparathyroidism of renal origin: Secondary | ICD-10-CM | POA: Diagnosis not present

## 2021-01-25 DIAGNOSIS — D509 Iron deficiency anemia, unspecified: Secondary | ICD-10-CM | POA: Diagnosis not present

## 2021-01-25 DIAGNOSIS — N186 End stage renal disease: Secondary | ICD-10-CM | POA: Diagnosis not present

## 2021-01-25 DIAGNOSIS — Z992 Dependence on renal dialysis: Secondary | ICD-10-CM | POA: Diagnosis not present

## 2021-01-28 DIAGNOSIS — Z992 Dependence on renal dialysis: Secondary | ICD-10-CM | POA: Diagnosis not present

## 2021-01-28 DIAGNOSIS — N186 End stage renal disease: Secondary | ICD-10-CM | POA: Diagnosis not present

## 2021-01-28 DIAGNOSIS — N2581 Secondary hyperparathyroidism of renal origin: Secondary | ICD-10-CM | POA: Diagnosis not present

## 2021-01-28 DIAGNOSIS — F419 Anxiety disorder, unspecified: Secondary | ICD-10-CM | POA: Diagnosis not present

## 2021-01-28 DIAGNOSIS — F329 Major depressive disorder, single episode, unspecified: Secondary | ICD-10-CM | POA: Diagnosis not present

## 2021-01-28 DIAGNOSIS — D509 Iron deficiency anemia, unspecified: Secondary | ICD-10-CM | POA: Diagnosis not present

## 2021-01-30 DIAGNOSIS — N186 End stage renal disease: Secondary | ICD-10-CM | POA: Diagnosis not present

## 2021-01-30 DIAGNOSIS — D509 Iron deficiency anemia, unspecified: Secondary | ICD-10-CM | POA: Diagnosis not present

## 2021-01-30 DIAGNOSIS — Z992 Dependence on renal dialysis: Secondary | ICD-10-CM | POA: Diagnosis not present

## 2021-01-30 DIAGNOSIS — N2581 Secondary hyperparathyroidism of renal origin: Secondary | ICD-10-CM | POA: Diagnosis not present

## 2021-01-31 DIAGNOSIS — H04123 Dry eye syndrome of bilateral lacrimal glands: Secondary | ICD-10-CM | POA: Diagnosis not present

## 2021-02-01 DIAGNOSIS — N2581 Secondary hyperparathyroidism of renal origin: Secondary | ICD-10-CM | POA: Diagnosis not present

## 2021-02-01 DIAGNOSIS — Z992 Dependence on renal dialysis: Secondary | ICD-10-CM | POA: Diagnosis not present

## 2021-02-01 DIAGNOSIS — D509 Iron deficiency anemia, unspecified: Secondary | ICD-10-CM | POA: Diagnosis not present

## 2021-02-01 DIAGNOSIS — N186 End stage renal disease: Secondary | ICD-10-CM | POA: Diagnosis not present

## 2021-02-04 DIAGNOSIS — N186 End stage renal disease: Secondary | ICD-10-CM | POA: Diagnosis not present

## 2021-02-04 DIAGNOSIS — N2581 Secondary hyperparathyroidism of renal origin: Secondary | ICD-10-CM | POA: Diagnosis not present

## 2021-02-04 DIAGNOSIS — D509 Iron deficiency anemia, unspecified: Secondary | ICD-10-CM | POA: Diagnosis not present

## 2021-02-04 DIAGNOSIS — Z992 Dependence on renal dialysis: Secondary | ICD-10-CM | POA: Diagnosis not present

## 2021-02-06 DIAGNOSIS — N2581 Secondary hyperparathyroidism of renal origin: Secondary | ICD-10-CM | POA: Diagnosis not present

## 2021-02-06 DIAGNOSIS — Z992 Dependence on renal dialysis: Secondary | ICD-10-CM | POA: Diagnosis not present

## 2021-02-06 DIAGNOSIS — N186 End stage renal disease: Secondary | ICD-10-CM | POA: Diagnosis not present

## 2021-02-06 DIAGNOSIS — D509 Iron deficiency anemia, unspecified: Secondary | ICD-10-CM | POA: Diagnosis not present

## 2021-02-08 DIAGNOSIS — N2581 Secondary hyperparathyroidism of renal origin: Secondary | ICD-10-CM | POA: Diagnosis not present

## 2021-02-08 DIAGNOSIS — D509 Iron deficiency anemia, unspecified: Secondary | ICD-10-CM | POA: Diagnosis not present

## 2021-02-08 DIAGNOSIS — N186 End stage renal disease: Secondary | ICD-10-CM | POA: Diagnosis not present

## 2021-02-08 DIAGNOSIS — Z992 Dependence on renal dialysis: Secondary | ICD-10-CM | POA: Diagnosis not present

## 2021-02-11 DIAGNOSIS — D509 Iron deficiency anemia, unspecified: Secondary | ICD-10-CM | POA: Diagnosis not present

## 2021-02-11 DIAGNOSIS — N2581 Secondary hyperparathyroidism of renal origin: Secondary | ICD-10-CM | POA: Diagnosis not present

## 2021-02-11 DIAGNOSIS — Z992 Dependence on renal dialysis: Secondary | ICD-10-CM | POA: Diagnosis not present

## 2021-02-11 DIAGNOSIS — N186 End stage renal disease: Secondary | ICD-10-CM | POA: Diagnosis not present

## 2021-02-13 ENCOUNTER — Other Ambulatory Visit (INDEPENDENT_AMBULATORY_CARE_PROVIDER_SITE_OTHER): Payer: Self-pay | Admitting: Nurse Practitioner

## 2021-02-13 DIAGNOSIS — Z992 Dependence on renal dialysis: Secondary | ICD-10-CM | POA: Diagnosis not present

## 2021-02-13 DIAGNOSIS — N186 End stage renal disease: Secondary | ICD-10-CM

## 2021-02-13 DIAGNOSIS — N2581 Secondary hyperparathyroidism of renal origin: Secondary | ICD-10-CM | POA: Diagnosis not present

## 2021-02-13 DIAGNOSIS — D509 Iron deficiency anemia, unspecified: Secondary | ICD-10-CM | POA: Diagnosis not present

## 2021-02-14 ENCOUNTER — Encounter (INDEPENDENT_AMBULATORY_CARE_PROVIDER_SITE_OTHER): Payer: Self-pay | Admitting: Nurse Practitioner

## 2021-02-14 ENCOUNTER — Ambulatory Visit (INDEPENDENT_AMBULATORY_CARE_PROVIDER_SITE_OTHER): Payer: Medicare Other

## 2021-02-14 ENCOUNTER — Ambulatory Visit (INDEPENDENT_AMBULATORY_CARE_PROVIDER_SITE_OTHER): Payer: Medicare Other | Admitting: Nurse Practitioner

## 2021-02-14 ENCOUNTER — Other Ambulatory Visit: Payer: Self-pay

## 2021-02-14 VITALS — BP 94/62 | HR 67 | Ht 63.0 in | Wt 112.0 lb

## 2021-02-14 DIAGNOSIS — N186 End stage renal disease: Secondary | ICD-10-CM

## 2021-02-14 DIAGNOSIS — E1122 Type 2 diabetes mellitus with diabetic chronic kidney disease: Secondary | ICD-10-CM

## 2021-02-14 DIAGNOSIS — I10 Essential (primary) hypertension: Secondary | ICD-10-CM | POA: Diagnosis not present

## 2021-02-14 DIAGNOSIS — Z992 Dependence on renal dialysis: Secondary | ICD-10-CM | POA: Diagnosis not present

## 2021-02-15 DIAGNOSIS — N186 End stage renal disease: Secondary | ICD-10-CM | POA: Diagnosis not present

## 2021-02-15 DIAGNOSIS — D631 Anemia in chronic kidney disease: Secondary | ICD-10-CM | POA: Diagnosis not present

## 2021-02-15 DIAGNOSIS — N2581 Secondary hyperparathyroidism of renal origin: Secondary | ICD-10-CM | POA: Diagnosis not present

## 2021-02-15 DIAGNOSIS — D509 Iron deficiency anemia, unspecified: Secondary | ICD-10-CM | POA: Diagnosis not present

## 2021-02-15 DIAGNOSIS — Z992 Dependence on renal dialysis: Secondary | ICD-10-CM | POA: Diagnosis not present

## 2021-02-18 DIAGNOSIS — N186 End stage renal disease: Secondary | ICD-10-CM | POA: Diagnosis not present

## 2021-02-18 DIAGNOSIS — Z992 Dependence on renal dialysis: Secondary | ICD-10-CM | POA: Diagnosis not present

## 2021-02-18 DIAGNOSIS — D631 Anemia in chronic kidney disease: Secondary | ICD-10-CM | POA: Diagnosis not present

## 2021-02-18 DIAGNOSIS — N2581 Secondary hyperparathyroidism of renal origin: Secondary | ICD-10-CM | POA: Diagnosis not present

## 2021-02-18 DIAGNOSIS — D509 Iron deficiency anemia, unspecified: Secondary | ICD-10-CM | POA: Diagnosis not present

## 2021-02-20 DIAGNOSIS — N2581 Secondary hyperparathyroidism of renal origin: Secondary | ICD-10-CM | POA: Diagnosis not present

## 2021-02-20 DIAGNOSIS — N186 End stage renal disease: Secondary | ICD-10-CM | POA: Diagnosis not present

## 2021-02-20 DIAGNOSIS — D631 Anemia in chronic kidney disease: Secondary | ICD-10-CM | POA: Diagnosis not present

## 2021-02-20 DIAGNOSIS — Z992 Dependence on renal dialysis: Secondary | ICD-10-CM | POA: Diagnosis not present

## 2021-02-20 DIAGNOSIS — D509 Iron deficiency anemia, unspecified: Secondary | ICD-10-CM | POA: Diagnosis not present

## 2021-02-22 DIAGNOSIS — D509 Iron deficiency anemia, unspecified: Secondary | ICD-10-CM | POA: Diagnosis not present

## 2021-02-22 DIAGNOSIS — N186 End stage renal disease: Secondary | ICD-10-CM | POA: Diagnosis not present

## 2021-02-22 DIAGNOSIS — Z992 Dependence on renal dialysis: Secondary | ICD-10-CM | POA: Diagnosis not present

## 2021-02-22 DIAGNOSIS — N2581 Secondary hyperparathyroidism of renal origin: Secondary | ICD-10-CM | POA: Diagnosis not present

## 2021-02-22 DIAGNOSIS — D631 Anemia in chronic kidney disease: Secondary | ICD-10-CM | POA: Diagnosis not present

## 2021-02-25 ENCOUNTER — Encounter (INDEPENDENT_AMBULATORY_CARE_PROVIDER_SITE_OTHER): Payer: Self-pay | Admitting: Nurse Practitioner

## 2021-02-25 DIAGNOSIS — Z992 Dependence on renal dialysis: Secondary | ICD-10-CM | POA: Diagnosis not present

## 2021-02-25 DIAGNOSIS — N2581 Secondary hyperparathyroidism of renal origin: Secondary | ICD-10-CM | POA: Diagnosis not present

## 2021-02-25 DIAGNOSIS — D631 Anemia in chronic kidney disease: Secondary | ICD-10-CM | POA: Diagnosis not present

## 2021-02-25 DIAGNOSIS — D509 Iron deficiency anemia, unspecified: Secondary | ICD-10-CM | POA: Diagnosis not present

## 2021-02-25 DIAGNOSIS — N186 End stage renal disease: Secondary | ICD-10-CM | POA: Diagnosis not present

## 2021-02-25 NOTE — Progress Notes (Signed)
Subjective:    Patient ID: [name], female    DOB: Jan 31, 1976, 45 y.o.   MRN: [id number] Chief Complaint  Patient presents with   Follow-up    6 mo HDA    [name] is a 45 year old female that returns to the office for followup status post intervention of the dialysis access left axillary hero graft.  The patient does not note any significant issues during dialysis.  Her caretaker also does not note being aware issues during dialysis.  The patient denies an increase in arm swelling. At the present time the patient denies hand pain.  The patient denies amaurosis fugax or recent TIA symptoms. There are no recent neurological changes noted. The patient denies claudication symptoms or rest pain symptoms. The patient denies history of DVT, PE or superficial thrombophlebitis. The patient denies recent episodes of angina or shortness of breath.    Today the patient has a flow volume of 665. Elevated velocities near the arterial anastomosis site.  Previous flow volumes were 1545 approximately 6 months ago.   Review of Systems  Hematological:  Does not bruise/bleed easily.  All other systems reviewed and are negative.     Objective:   Physical Exam Vitals reviewed.  HENT:     Head: Normocephalic.  Cardiovascular:     Rate and Rhythm: Normal rate.     Pulses:          Radial pulses are 2+ on the left side.  Pulmonary:     Effort: Pulmonary effort is normal.  Musculoskeletal:     Cervical back: Normal range of motion.  Neurological:     Mental Status: She is alert and oriented to person, place, and time.  Psychiatric:        Attention and Perception: Attention normal.        Mood and Affect: Mood normal.        Speech: Speech normal.        Behavior: Behavior is slowed.        Cognition and Memory: Cognition is impaired.    BP 94/62   Pulse 67   Ht 5\' 3"  (1.6 m)   Wt 112 lb (50.8 kg)   BMI 19.84 kg/m   Past Medical History:  Diagnosis Date   Anemia     BV (bacterial vaginosis) 11/08/2013   Depression    Dialysis patient [facility])    DVT (deep venous thrombosis) ([place])    Hypertension    Lupus ([place])    Renal disorder    Vaginal odor 11/08/2013    Social History   Socioeconomic History   Marital status: Single    Spouse name: Not on file   Number of children: Not on file   Years of education: Not on file   Highest education level: Not on file  Occupational History   Not on file  Tobacco Use   Smoking status: Every Day    Packs/day: 0.25    Years: 15.00    Pack years: 3.75    Types: Cigarettes   Smokeless tobacco: Never  Vaping Use   Vaping Use: Never used  Substance and Sexual Activity   Alcohol use: No   Drug use: No   Sexual activity: Not Currently    Birth control/protection: None  Other Topics Concern   Not on file  Social History Narrative   Lives at [facility] #[facility]   Social Determinants of Health   Financial Resource Strain: Not on file  Food Insecurity:  Not on file  Transportation Needs: Not on file  Physical Activity: Not on file  Stress: Not on file  Social Connections: Not on file  Intimate Partner Violence: Not on file    Past Surgical History:  Procedure Laterality Date   A/V SHUNT INTERVENTION N/A 10/24/2016   Procedure: A/V SHUNT INTERVENTION;  Surgeon: Katha Cabal, MD;  Location: [place] [facility];  Service: Cardiovascular;  Laterality: N/A;   A/V SHUNTOGRAM Left 12/08/2017   Procedure: A/V SHUNTOGRAM;  Surgeon: Katha Cabal, MD;  Location: [place] [facility];  Service: Cardiovascular;  Laterality: Left;   A/V SHUNTOGRAM Left 01/28/2018   Procedure: A/V SHUNTOGRAM;  Surgeon: Algernon Huxley, MD;  Location: [place] [facility];  Service: Cardiovascular;  Laterality: Left;   A/V SHUNTOGRAM Left 04/01/2018   Procedure: A/V SHUNTOGRAM;  Surgeon: Algernon Huxley, MD;  Location: [place] [facility];  Service: Cardiovascular;  Laterality: Left;   A/V SHUNTOGRAM Left 04/28/2019    Procedure: A/V SHUNTOGRAM;  Surgeon: Algernon Huxley, MD;  Location: [place] [facility];  Service: Cardiovascular;  Laterality: Left;   AV FISTULA PLACEMENT     DIALYSIS/PERMA CATHETER INSERTION  10/23/2016   Procedure: DIALYSIS/PERMA CATHETER INSERTION;  Surgeon: Algernon Huxley, MD;  Location: [place] [facility];  Service: Cardiovascular;;   IR REMOVAL TUN CV CATH W/O FL  09/22/2017   IR THROMBECTOMY AV FISTULA W/THROMBOLYSIS INC/SHUNT/IMG LEFT Left 09/04/2017   IR US GUIDE VASC ACCESS LEFT  09/04/2017   MASS EXCISION Left 06/01/2018   Procedure: EXCISION 5 X 3CM LIPOMA LEFT THIGH;  Surgeon: Aviva Signs, MD;  [facility];  Service: General;  Laterality: Left;   PERIPHERAL VASCULAR CATHETERIZATION Left 01/04/2015   Procedure: A/V Shuntogram/Fistulagram;  Surgeon: Algernon Huxley, MD;  Location: [place] [facility];  Service: Cardiovascular;  Laterality: Left;   PERIPHERAL VASCULAR CATHETERIZATION N/A 01/04/2015   Procedure: A/V Shunt Intervention;  Surgeon: Algernon Huxley, MD;  Location: [place] [facility];  Service: Cardiovascular;  Laterality: N/A;   PERIPHERAL VASCULAR CATHETERIZATION Left 04/13/2015   Procedure: A/V Shuntogram/Fistulagram;  Surgeon: Katha Cabal, MD;  Location: [place] [facility];  Service: Cardiovascular;  Laterality: Left;   PERIPHERAL VASCULAR CATHETERIZATION Left 04/13/2015   Procedure: A/V Shunt Intervention;  Surgeon: Katha Cabal, MD;  Location: [place] [facility];  Service: Cardiovascular;  Laterality: Left;    Family History  Problem Relation Age of Onset   Seizures Son    Kidney disease Mother        on dialysis   Lupus Mother     Allergies  Allergen Reactions   Dust Mite Extract Other (See Comments)    sneezing    CBC Latest Ref Rng & Units 02/08/2020 06/01/2018 09/04/2017  WBC 4.0 - 10.5 K/uL 24.4(H) - 9.3  Hemoglobin 12.0 - 15.0 g/dL 13.6 12.3 10.2(L)  Hematocrit 36.0 - 46.0 % 39.1 36.3 30.4(L)  Platelets 150 - 400 K/uL 181 180 241       CMP     Component Value Date/Time   NA 133 (L) 02/08/2020 1750   NA 137 02/18/2013 0440   K 3.3 (L) 02/08/2020 1750   K 4.9 07/06/2014 1308   CL 90 (L) 02/08/2020 1750   CL 100 02/18/2013 0440   CO2 26 02/08/2020 1750   CO2 33 (H) 02/18/2013 0440   GLUCOSE 92 02/08/2020 1750   GLUCOSE 73 02/18/2013 0440   BUN 22 (H) 02/08/2020 1750  BUN 9 02/18/2013 0440   CREATININE 6.25 (H) 02/08/2020 1750   CREATININE 1.93 (H) 02/18/2013 0440   CALCIUM 8.4 (L) 02/08/2020 1750   CALCIUM 8.2 (L) 02/18/2013 0440   CALCIUM (LL) 04/08/2007 1300    6.1 Result repeated and verified. CRITICAL VALUE NOTED.  VALUE IS CONSISTENT WITH PREVIOUSLY REPORTED AND CALLED VALUE. CRITICAL RESULT CALLED TO, READ BACK BY AND VERIFIED WITH: MENIGOSTO,A RN [id number] [place] S.   PROT 8.5 (H) 02/08/2020 1750   ALBUMIN 4.1 02/08/2020 1750   AST 18 02/08/2020 1750   ALT 18 02/08/2020 1750   ALKPHOS 102 02/08/2020 1750   BILITOT 0.5 02/08/2020 1750   GFRNONAA 8 (L) 02/08/2020 1750   GFRNONAA 32 (L) 02/18/2013 0440   GFRAA 8 (L) 06/01/2018 0822   GFRAA 37 (L) 02/18/2013 0440     No results found.     Assessment & Plan:   1. ESRD (end stage renal disease) ([place]) Recommend:  The patient is experiencing increasing problems with their dialysis access.  Patient should have a fistulagram with the intention for intervention.  The intention for intervention is to restore appropriate flow and prevent thrombosis and possible loss of the access.  As well as improve the quality of dialysis therapy.  The risks, benefits and alternative therapies were reviewed in detail with the patient.  All questions were answered.  The patient agrees to proceed with angio/intervention.      2. Essential (primary) hypertension Continue antihypertensive medications as already ordered, these medications have been reviewed and there are no changes at this time.   3. Type 2 diabetes mellitus with chronic kidney disease on  chronic dialysis, unspecified whether long term insulin use ([place]) Continue hypoglycemic medications as already ordered, these medications have been reviewed and there are no changes at this time.  Hgb A1C to be monitored as already arranged by primary service    Current Outpatient Medications on File Prior to Visit  Medication Sig Dispense Refill   albuterol (PROVENTIL) (2.5 MG/3ML) 0.083% nebulizer solution Take 2.5 mg by nebulization 4 (four) times daily as needed for wheezing or shortness of breath.      ALPRAZolam (XANAX) 0.25 MG tablet Take 0.25 mg by mouth 2 (two) times daily.     ANTACID CALCIUM 500 MG chewable tablet Chew by mouth.     aspirin EC 81 MG tablet Take 81 mg by mouth daily.     chlorhexidine (PERIDEX) 0.12 % solution Use as directed 5 mLs in the mouth or throat 2 (two) times daily as needed.      clopidogrel (PLAVIX) 75 MG tablet Take 75 mg by mouth daily.     docusate sodium (COLACE) 100 MG capsule Take 100 mg by mouth daily.     epoetin alfa (EPOGEN,PROCRIT) 4000 UNIT/ML injection Inject 4,000 Units into the vein See admin instructions. Mon, wed, and Friday at dialysis     guaiFENesin (ROBITUSSIN) 100 MG/5ML liquid Take 200 mg by mouth every 6 (six) hours as needed for cough.      HYDROcodone-acetaminophen (NORCO) 5-325 MG tablet Take 1 tablet by mouth every 6 (six) hours as needed for moderate pain. 20 tablet 0   hydroxychloroquine (PLAQUENIL) 200 MG tablet Take 200 mg by mouth daily.     ipratropium (ATROVENT) 0.02 % nebulizer solution Take 250 mcg by nebulization 4 (four) times daily as needed for wheezing or shortness of breath.      lidocaine-prilocaine (EMLA) cream Apply 1 application topically as needed (port access).  loratadine (CLARITIN) 10 MG tablet Take 10 mg by mouth daily as needed for allergies.     midodrine (PROAMATINE) 10 MG tablet Take 10 mg by mouth 2 (two) times daily.      mirtazapine (REMERON) 15 MG tablet Take 7.5 mg by mouth at bedtime.       multivitamin (RENA-VIT) TABS tablet Take 1 tablet by mouth daily.     pantoprazole (PROTONIX) 40 MG tablet Take 40 mg by mouth daily.     phenytoin (DILANTIN) 100 MG ER capsule Take 100 mg by mouth 2 (two) times daily.      RESTASIS 0.05 % ophthalmic emulsion      sertraline (ZOLOFT) 100 MG tablet Take 200 mg by mouth daily.      sevelamer carbonate (RENVELA) 800 MG tablet Take 800 mg by mouth See admin instructions. Take 800 mg by mouth three times daily with meals and 800 mg by mouth twice daily with snacks     simvastatin (ZOCOR) 40 MG tablet Take 40 mg by mouth at bedtime.      No current facility-administered medications on file prior to visit.    There are no Patient Instructions on file for this visit. No follow-ups on file.   Kris Hartmann, NP

## 2021-02-27 DIAGNOSIS — N186 End stage renal disease: Secondary | ICD-10-CM | POA: Diagnosis not present

## 2021-02-27 DIAGNOSIS — F419 Anxiety disorder, unspecified: Secondary | ICD-10-CM | POA: Diagnosis not present

## 2021-02-27 DIAGNOSIS — D509 Iron deficiency anemia, unspecified: Secondary | ICD-10-CM | POA: Diagnosis not present

## 2021-02-27 DIAGNOSIS — Z992 Dependence on renal dialysis: Secondary | ICD-10-CM | POA: Diagnosis not present

## 2021-02-27 DIAGNOSIS — D631 Anemia in chronic kidney disease: Secondary | ICD-10-CM | POA: Diagnosis not present

## 2021-02-27 DIAGNOSIS — N2581 Secondary hyperparathyroidism of renal origin: Secondary | ICD-10-CM | POA: Diagnosis not present

## 2021-02-27 DIAGNOSIS — Z79899 Other long term (current) drug therapy: Secondary | ICD-10-CM | POA: Diagnosis not present

## 2021-02-27 DIAGNOSIS — F329 Major depressive disorder, single episode, unspecified: Secondary | ICD-10-CM | POA: Diagnosis not present

## 2021-03-01 DIAGNOSIS — D509 Iron deficiency anemia, unspecified: Secondary | ICD-10-CM | POA: Diagnosis not present

## 2021-03-01 DIAGNOSIS — D631 Anemia in chronic kidney disease: Secondary | ICD-10-CM | POA: Diagnosis not present

## 2021-03-01 DIAGNOSIS — N186 End stage renal disease: Secondary | ICD-10-CM | POA: Diagnosis not present

## 2021-03-01 DIAGNOSIS — N2581 Secondary hyperparathyroidism of renal origin: Secondary | ICD-10-CM | POA: Diagnosis not present

## 2021-03-01 DIAGNOSIS — Z992 Dependence on renal dialysis: Secondary | ICD-10-CM | POA: Diagnosis not present

## 2021-03-04 DIAGNOSIS — D631 Anemia in chronic kidney disease: Secondary | ICD-10-CM | POA: Diagnosis not present

## 2021-03-04 DIAGNOSIS — N2581 Secondary hyperparathyroidism of renal origin: Secondary | ICD-10-CM | POA: Diagnosis not present

## 2021-03-04 DIAGNOSIS — Z992 Dependence on renal dialysis: Secondary | ICD-10-CM | POA: Diagnosis not present

## 2021-03-04 DIAGNOSIS — N186 End stage renal disease: Secondary | ICD-10-CM | POA: Diagnosis not present

## 2021-03-04 DIAGNOSIS — D509 Iron deficiency anemia, unspecified: Secondary | ICD-10-CM | POA: Diagnosis not present

## 2021-03-06 DIAGNOSIS — Z992 Dependence on renal dialysis: Secondary | ICD-10-CM | POA: Diagnosis not present

## 2021-03-06 DIAGNOSIS — D509 Iron deficiency anemia, unspecified: Secondary | ICD-10-CM | POA: Diagnosis not present

## 2021-03-06 DIAGNOSIS — N186 End stage renal disease: Secondary | ICD-10-CM | POA: Diagnosis not present

## 2021-03-06 DIAGNOSIS — D631 Anemia in chronic kidney disease: Secondary | ICD-10-CM | POA: Diagnosis not present

## 2021-03-06 DIAGNOSIS — N2581 Secondary hyperparathyroidism of renal origin: Secondary | ICD-10-CM | POA: Diagnosis not present

## 2021-03-07 DIAGNOSIS — N186 End stage renal disease: Secondary | ICD-10-CM | POA: Diagnosis not present

## 2021-03-07 DIAGNOSIS — K219 Gastro-esophageal reflux disease without esophagitis: Secondary | ICD-10-CM | POA: Diagnosis not present

## 2021-03-07 DIAGNOSIS — M329 Systemic lupus erythematosus, unspecified: Secondary | ICD-10-CM | POA: Diagnosis not present

## 2021-03-07 DIAGNOSIS — D649 Anemia, unspecified: Secondary | ICD-10-CM | POA: Diagnosis not present

## 2021-03-08 DIAGNOSIS — N2581 Secondary hyperparathyroidism of renal origin: Secondary | ICD-10-CM | POA: Diagnosis not present

## 2021-03-08 DIAGNOSIS — N186 End stage renal disease: Secondary | ICD-10-CM | POA: Diagnosis not present

## 2021-03-08 DIAGNOSIS — D631 Anemia in chronic kidney disease: Secondary | ICD-10-CM | POA: Diagnosis not present

## 2021-03-08 DIAGNOSIS — D509 Iron deficiency anemia, unspecified: Secondary | ICD-10-CM | POA: Diagnosis not present

## 2021-03-08 DIAGNOSIS — Z992 Dependence on renal dialysis: Secondary | ICD-10-CM | POA: Diagnosis not present

## 2021-03-11 DIAGNOSIS — D631 Anemia in chronic kidney disease: Secondary | ICD-10-CM | POA: Diagnosis not present

## 2021-03-11 DIAGNOSIS — N186 End stage renal disease: Secondary | ICD-10-CM | POA: Diagnosis not present

## 2021-03-11 DIAGNOSIS — D509 Iron deficiency anemia, unspecified: Secondary | ICD-10-CM | POA: Diagnosis not present

## 2021-03-11 DIAGNOSIS — N2581 Secondary hyperparathyroidism of renal origin: Secondary | ICD-10-CM | POA: Diagnosis not present

## 2021-03-11 DIAGNOSIS — Z992 Dependence on renal dialysis: Secondary | ICD-10-CM | POA: Diagnosis not present

## 2021-03-13 DIAGNOSIS — Z992 Dependence on renal dialysis: Secondary | ICD-10-CM | POA: Diagnosis not present

## 2021-03-13 DIAGNOSIS — D631 Anemia in chronic kidney disease: Secondary | ICD-10-CM | POA: Diagnosis not present

## 2021-03-13 DIAGNOSIS — N186 End stage renal disease: Secondary | ICD-10-CM | POA: Diagnosis not present

## 2021-03-13 DIAGNOSIS — N2581 Secondary hyperparathyroidism of renal origin: Secondary | ICD-10-CM | POA: Diagnosis not present

## 2021-03-13 DIAGNOSIS — D509 Iron deficiency anemia, unspecified: Secondary | ICD-10-CM | POA: Diagnosis not present

## 2021-03-15 DIAGNOSIS — Z992 Dependence on renal dialysis: Secondary | ICD-10-CM | POA: Diagnosis not present

## 2021-03-15 DIAGNOSIS — N2581 Secondary hyperparathyroidism of renal origin: Secondary | ICD-10-CM | POA: Diagnosis not present

## 2021-03-15 DIAGNOSIS — N186 End stage renal disease: Secondary | ICD-10-CM | POA: Diagnosis not present

## 2021-03-15 DIAGNOSIS — D631 Anemia in chronic kidney disease: Secondary | ICD-10-CM | POA: Diagnosis not present

## 2021-03-15 DIAGNOSIS — D509 Iron deficiency anemia, unspecified: Secondary | ICD-10-CM | POA: Diagnosis not present

## 2021-03-16 DIAGNOSIS — N186 End stage renal disease: Secondary | ICD-10-CM | POA: Diagnosis not present

## 2021-03-16 DIAGNOSIS — Z992 Dependence on renal dialysis: Secondary | ICD-10-CM | POA: Diagnosis not present

## 2021-08-14 ENCOUNTER — Other Ambulatory Visit (HOSPITAL_COMMUNITY): Payer: Self-pay | Admitting: Internal Medicine

## 2021-08-14 DIAGNOSIS — Z1231 Encounter for screening mammogram for malignant neoplasm of breast: Secondary | ICD-10-CM

## 2021-09-26 ENCOUNTER — Ambulatory Visit (HOSPITAL_COMMUNITY): Payer: Medicare Other

## 2021-10-03 ENCOUNTER — Ambulatory Visit (HOSPITAL_COMMUNITY)
Admission: RE | Admit: 2021-10-03 | Discharge: 2021-10-03 | Disposition: A | Discharge status: Home / Self Care | Payer: Medicare Other | Source: Ambulatory Visit | Attending: Internal Medicine | Admitting: Internal Medicine

## 2021-10-03 DIAGNOSIS — Z1231 Encounter for screening mammogram for malignant neoplasm of breast: Secondary | ICD-10-CM | POA: Diagnosis present

## 2022-01-23 ENCOUNTER — Encounter (HOSPITAL_COMMUNITY): Payer: Self-pay

## 2022-01-23 ENCOUNTER — Emergency Department (HOSPITAL_COMMUNITY): Payer: Medicare Other

## 2022-01-23 ENCOUNTER — Other Ambulatory Visit: Payer: Self-pay

## 2022-01-23 ENCOUNTER — Emergency Department (HOSPITAL_COMMUNITY)
Admission: EM | Admit: 2022-01-23 | Discharge: 2022-01-23 | Disposition: A | Discharge status: Home / Self Care | Payer: Medicare Other | Attending: Emergency Medicine | Admitting: Emergency Medicine

## 2022-01-23 DIAGNOSIS — I10 Essential (primary) hypertension: Secondary | ICD-10-CM | POA: Diagnosis not present

## 2022-01-23 DIAGNOSIS — R079 Chest pain, unspecified: Secondary | ICD-10-CM | POA: Diagnosis not present

## 2022-01-23 DIAGNOSIS — Z7901 Long term (current) use of anticoagulants: Secondary | ICD-10-CM | POA: Insufficient documentation

## 2022-01-23 DIAGNOSIS — Z7982 Long term (current) use of aspirin: Secondary | ICD-10-CM | POA: Diagnosis not present

## 2022-01-23 DIAGNOSIS — T82838A Hemorrhage of vascular prosthetic devices, implants and grafts, initial encounter: Secondary | ICD-10-CM | POA: Diagnosis present

## 2022-01-23 DIAGNOSIS — E119 Type 2 diabetes mellitus without complications: Secondary | ICD-10-CM | POA: Diagnosis not present

## 2022-01-23 LAB — CBC
HCT: 34.5 % — ABNORMAL LOW (ref 36.0–46.0)
Hemoglobin: 11.8 g/dL — ABNORMAL LOW (ref 12.0–15.0)
MCH: 29.1 pg (ref 26.0–34.0)
MCHC: 34.2 g/dL (ref 30.0–36.0)
MCV: 85.2 fL (ref 80.0–100.0)
Platelets: 298 10*3/uL (ref 150–400)
RBC: 4.05 MIL/uL (ref 3.87–5.11)
RDW: 15.2 % (ref 11.5–15.5)
WBC: 8.8 10*3/uL (ref 4.0–10.5)
nRBC: 0 % (ref 0.0–0.2)

## 2022-01-23 LAB — BASIC METABOLIC PANEL
Anion gap: 15 (ref 5–15)
BUN: 66 mg/dL — ABNORMAL HIGH (ref 6–20)
CO2: 26 mmol/L (ref 22–32)
Calcium: 9.1 mg/dL (ref 8.9–10.3)
Chloride: 99 mmol/L (ref 98–111)
Creatinine, Ser: 11.4 mg/dL — ABNORMAL HIGH (ref 0.44–1.00)
GFR, Estimated: 4 mL/min — ABNORMAL LOW (ref >60)
Glucose, Bld: 85 mg/dL (ref 70–99)
Potassium: 5.3 mmol/L — ABNORMAL HIGH (ref 3.5–5.1)
Sodium: 140 mmol/L (ref 135–145)

## 2022-01-23 LAB — TROPONIN I (HIGH SENSITIVITY)
Troponin I (High Sensitivity): 4 ng/L (ref <18)
Troponin I (High Sensitivity): 4 ng/L (ref <18)

## 2022-01-23 NOTE — ED Notes (Signed)
Pt facility updated on patient plan of care. Spoke with [place] owner of the facility.

## 2022-01-23 NOTE — ED Notes (Signed)
[facility] called to come pick up patient.

## 2022-01-23 NOTE — Discharge Instructions (Addendum)
You were seen today due to bleeding from your dialysis shunt which was placed yesterday.  Bleeding was controlled by the time you arrived to the emergency department.  I do recommend following up with the vascular surgeon for further evaluation and management as needed.  Unfortunately your surgeons information was not in our system so I am unable to give you the phone number for follow-up.  Please look in your discharge instructions from yesterday and contact them tomorrow. I also recommend not taking Plavix for the next two days. If your access site begins bleeding again and it is unable to be controlled with pressure, please return to the emergency department for re-evaluation  Your work-up was reassuring for no coronary cause of your chest pain today.  If you develop any life-threatening symptoms such as shortness of breath or severe chest pain, please return to the emergency department

## 2022-01-23 NOTE — ED Notes (Signed)
Pt left upper arm not bleeding at this time. Pressure dressing applied with nonstick, gauze, and coban. Pt reports dressing comfortable.

## 2022-01-23 NOTE — ED Provider Notes (Signed)
[facility] EMERGENCY DEPARTMENT Provider Note   CSN: [id number] Arrival date & time: 01/23/22  1327     History  Chief Complaint  Patient presents with   Vascular Access Problem    bleeding    [name] is a 46 y.o. female.  Patient presents the emergency department via EMS complaining of a bleeding dialysis fistula.  Patient reportedly had a fistula surgically created yesterday.  States she was sitting around doing nothing today when she noticed bleeding coming the area.  Patient arrives with a towel wrapped around her arm which has been soaked with blood.  She denies any injury to the area.  Upon arrival she denies any other complaints including shortness of breath, weakness, chest pain, abdominal pain, nausea, vomiting.  Past medical history includes moderate intellectual disability, hypertension, tachycardia, complication of vascular access for dialysis, hyperkalemia, epilepsy, SLE, type II DM, history of DVT  HPI     Home Medications Prior to Admission medications   Medication Sig Start Date End Date Taking? Authorizing Provider  ANTACID CALCIUM 500 MG chewable tablet Chew 1 tablet by mouth daily. 01/20/22  Yes [provider]  albuterol (PROVENTIL) (2.5 MG/3ML) 0.083% nebulizer solution Take 2.5 mg by nebulization 4 (four) times daily as needed for wheezing or shortness of breath.     [provider]  ALPRAZolam Duanne Moron) 0.25 MG tablet Take 0.25 mg by mouth 2 (two) times daily.    [provider]  ANTACID CALCIUM 500 MG chewable tablet Chew by mouth. 06/21/20   [provider]  aspirin EC 81 MG tablet Take 81 mg by mouth daily.    [provider]  chlorhexidine (PERIDEX) 0.12 % solution Use as directed 5 mLs in the mouth or throat 2 (two) times daily as needed.     [provider]  clopidogrel (PLAVIX) 75 MG tablet Take 75 mg by mouth daily.    [provider]  docusate sodium (COLACE) 100 MG capsule Take 100 mg  by mouth daily.    [provider]  epoetin alfa (EPOGEN,PROCRIT) 4000 UNIT/ML injection Inject 4,000 Units into the vein See admin instructions. Mon, wed, and Friday at dialysis    [provider]  guaiFENesin (ROBITUSSIN) 100 MG/5ML liquid Take 200 mg by mouth every 6 (six) hours as needed for cough.     [provider]  HYDROcodone-acetaminophen (NORCO) 5-325 MG tablet Take 1 tablet by mouth every 6 (six) hours as needed for moderate pain. 06/01/18   Aviva Signs, MD  hydroxychloroquine (PLAQUENIL) 200 MG tablet Take 200 mg by mouth daily.    [provider]  ipratropium (ATROVENT) 0.02 % nebulizer solution Take 250 mcg by nebulization 4 (four) times daily as needed for wheezing or shortness of breath.     [provider]  lidocaine-prilocaine (EMLA) cream Apply 1 application topically as needed (port access).  12/20/12   [provider]  loratadine (CLARITIN) 10 MG tablet Take 10 mg by mouth daily as needed for allergies.    [provider]  midodrine (PROAMATINE) 10 MG tablet Take 10 mg by mouth 2 (two) times daily.     [provider]  mirtazapine (REMERON) 15 MG tablet Take 7.5 mg by mouth at bedtime.  11/03/13   [provider]  multivitamin (RENA-VIT) TABS tablet Take 1 tablet by mouth daily.    [provider]  pantoprazole (PROTONIX) 40 MG tablet Take 40 mg by mouth daily.    [provider]  phenytoin (  DILANTIN) 100 MG ER capsule Take 100 mg by mouth 2 (two) times daily.     [provider]  RESTASIS 0.05 % ophthalmic emulsion  01/31/21   [provider]  sertraline (ZOLOFT) 100 MG tablet Take 200 mg by mouth daily.     [provider]  sevelamer carbonate (RENVELA) 800 MG tablet Take 800 mg by mouth See admin instructions. Take 800 mg by mouth three times daily with meals and 800 mg by mouth twice daily with snacks    [provider]  simvastatin (ZOCOR) 40  MG tablet Take 40 mg by mouth at bedtime.     [provider]      Allergies    Dust mite extract    Review of Systems   Review of Systems  Skin:  Positive for wound.    Physical Exam Updated Vital Signs BP 123/80   Pulse 67 Comment: Simultaneous filing. User may not have seen previous data.  Temp 97.9 F (36.6 C) (Oral)   Resp 13   Ht '5\' 3"'$  (1.6 m)   Wt 50.8 kg   SpO2 100% Comment: Simultaneous filing. User may not have seen previous data.  BMI 19.84 kg/m  Physical Exam Vitals and nursing note reviewed.  Constitutional:      General: She is not in acute distress.    Appearance: She is well-developed.  HENT:     Head: Normocephalic and atraumatic.  Eyes:     Conjunctiva/sclera: Conjunctivae normal.  Cardiovascular:     Rate and Rhythm: Normal rate and regular rhythm.     Heart sounds: No murmur heard. Pulmonary:     Effort: Pulmonary effort is normal. No respiratory distress.     Breath sounds: Normal breath sounds.  Abdominal:     Palpations: Abdomen is soft.     Tenderness: There is no abdominal tenderness.  Musculoskeletal:        General: No swelling.     Cervical back: Neck supple.  Skin:    General: Skin is warm and dry.     Capillary Refill: Capillary refill takes less than 2 seconds.     Coloration: Skin is not pale.     Comments: No active bleeding from dialysis fistula  Neurological:     Mental Status: She is alert. Mental status is at baseline.  Psychiatric:        Mood and Affect: Mood normal.     ED Results / Procedures / Treatments   Labs (all labs ordered are listed, but only abnormal results are displayed) Labs Reviewed  BASIC METABOLIC PANEL - Abnormal; Notable for the following components:      Result Value   Potassium 5.3 (*)    BUN 66 (*)    Creatinine, Ser 11.40 (*)    GFR, Estimated 4 (*)    All other components within normal limits  CBC - Abnormal; Notable for the following components:   Hemoglobin 11.8 (*)    HCT  34.5 (*)    All other components within normal limits  TROPONIN I (HIGH SENSITIVITY)  TROPONIN I (HIGH SENSITIVITY)    EKG None  Radiology DG Chest Port 1 View  Result Date: 01/23/2022 CLINICAL DATA:  Chest pain. EXAM: PORTABLE CHEST 1 VIEW COMPARISON:  Chest radiograph 06/17/2013 FINDINGS: A left-sided vascular graft is again seen terminating at the superior cavoatrial junction, stable in position. The graft appears intact. A separate vascular stent is noted in the left axillary region. The cardiomediastinal silhouette is  normal. There is no focal consolidation or pulmonary edema. There is no pleural effusion or pneumothorax There is no acute osseous abnormality. IMPRESSION: Left-sided vascular graft is in stable position and appears intact. No radiographic evidence of acute cardiopulmonary process. Electronically Signed   By: Valetta Mole M.D.   On: 01/23/2022 17:05    Procedures Procedures    Medications Ordered in ED Medications - No data to display  ED Course/ Medical Decision Making/ A&P                           Medical Decision Making Amount and/or Complexity of Data Reviewed Labs: ordered. Radiology: ordered.   This patient presents to the ED for concern of bleeding dialysis fistula, this involves an extensive number of treatment options, and is a complaint that carries with it a high risk of complications and morbidity.  After a pressure dressing was placed the patient endorsed mild chest pain.  Different diagnosis includes ACS, symptomatic anemia, anxiety, musculoskeletal pain, and others   Co morbidities that complicate the patient evaluation  Moderate intellectual disability, DVT history, Plavix usage   Additional history obtained:  Additional history obtained from EMS External records from outside source obtained and reviewed including No documents available in care anywhere   Lab Tests:  I Ordered, and personally interpreted labs.  The pertinent results  include: Hemoglobin 11.8  Imaging Studies ordered:  I ordered imaging studies including chest x-ray I independently visualized and interpreted imaging which showed  Left-sided vascular graft is in stable position and appears intact.  No radiographic evidence of acute cardiopulmonary process.   I agree with the radiologist interpretation   Cardiac Monitoring: / EKG:  The patient was maintained on a cardiac monitor.  I personally viewed and interpreted the cardiac monitored which showed an underlying rhythm of: Sinus rhythm  Social Determinants of Health:  Patient with history of intellectual disability, lives in group home   Test / Admission - Considered:  The patient's bleeding had stopped prior to arrival.  There is no indication at this time for admission.  Patient's arm was wrapped with a pressure dressing.  Recommend the patient follow-up with her vascular surgeon for further evaluation and management as needed.  I also recommend that the patient not take plavix for the next two days. The patient's chest pain has an unknown etiology at this time.  Patient's hemoglobin is stable.  EKG was unremarkable with no ischemic changes.  Troponins were negative.  No signs of ACS at this time.  No shortness of breath or tachycardia to suggest PE.  No signs of dissection.  Chest x-ray shows no pneumonia.  At this time will discharge patient back to her group care facility.        Final Clinical Impression(s) / ED Diagnoses Final diagnoses:  Bleeding from dialysis shunt, initial encounter [facility])  Chest pain, unspecified type    Rx / DC Orders ED Discharge Orders     None         Ronny Bacon 01/23/22 1828    Wyvonnia Dusky, MD 01/24/22 [phone]

## 2022-01-23 NOTE — ED Triage Notes (Signed)
Dialysis port bleeding.  VS 93% 117/69

## 2022-02-05 ENCOUNTER — Ambulatory Visit (INDEPENDENT_AMBULATORY_CARE_PROVIDER_SITE_OTHER): Payer: Medicare Other | Admitting: Vascular Surgery

## 2022-02-05 ENCOUNTER — Encounter: Payer: Self-pay | Admitting: Vascular Surgery

## 2022-02-05 VITALS — BP 95/68 | HR 84 | Temp 97.2°F | Ht 63.0 in | Wt 118.4 lb

## 2022-02-05 DIAGNOSIS — Z992 Dependence on renal dialysis: Secondary | ICD-10-CM

## 2022-02-05 DIAGNOSIS — N186 End stage renal disease: Secondary | ICD-10-CM

## 2022-02-05 NOTE — Progress Notes (Signed)
[facility] [place]  Patient name: [name] MRN: [id number] DOB: Jun 24, 1975 Sex: female  REASON FOR CONSULT: Evaluation left upper arm AV Gore-Tex graft for prolonged bleeding  HPI: [name] is a 46 y.o. female, who is here today for evaluation.  She has a long history of end-stage renal disease with access in her left upper arm.  She undergoes dialysis at [facility] on Monday Wednesday and Friday.  In looking at her epic chart, her surgical access has been done in [place] with Dr. Lucky Cowboy.  She has had extreme the long-term use of a left upper arm loop hero graft.  She had multiple prior interventions in the past but it has been several years since any intervention.  The most recent was 04/28/2019.  Her hero graft dates to before 2014.  She has had episodes of persistent bleeding and on 1 occasion was taken to the emergency room for this.  Past Medical History:  Diagnosis Date   Anemia    BV (bacterial vaginosis) 11/08/2013   Depression    Dialysis patient [facility])    DVT (deep venous thrombosis) ([place])    Hypertension    Lupus ([place])    Renal disorder    Vaginal odor 11/08/2013    Family History  Problem Relation Age of Onset   Seizures Son    Kidney disease Mother        on dialysis   Lupus Mother     SOCIAL HISTORY: Social History   Socioeconomic History   Marital status: Single    Spouse name: Not on file   Number of children: Not on file   Years of education: Not on file   Highest education level: Not on file  Occupational History   Not on file  Tobacco Use   Smoking status: Every Day    Packs/day: 0.10    Years: 15.00    Total pack years: 1.50    Types: Cigarettes   Smokeless tobacco: Never  Vaping Use   Vaping Use: Never used  Substance and Sexual Activity   Alcohol use: No   Drug use: No   Sexual activity: Not Currently    Birth control/protection: None  Other Topics Concern   Not  on file  Social History Narrative   [facility]ives at [facility]   Social Determinants of Health   Financial Resource Strain: Not on file  Food Insecurity: Not on file  Transportation Needs: Not on file  Physical Activity: Not on file  Stress: Not on file  Social Connections: Not on file  Intimate Partner Violence: Not on file    Allergies  Allergen Reactions   Dust Mite Extract Other (See Comments)    sneezing    Current Outpatient Medications  Medication Sig Dispense Refill   albuterol (PROVENTIL) (2.5 MG/3ML) 0.083% nebulizer solution Take 2.5 mg by nebulization 4 (four) times daily as needed for wheezing or shortness of breath.      ALPRAZolam (XANAX) 0.25 MG tablet Take 0.25 mg by mouth 2 (two) times daily.     ANTACID CALCIUM 500 MG chewable tablet Chew by mouth.     ANTACID CALCIUM 500 MG chewable tablet Chew 1 tablet by mouth daily.     aspirin EC 81 MG tablet Take 81 mg by mouth daily.     chlorhexidine (PERIDEX) 0.12 % solution Use as directed 5 mLs in the mouth or throat 2 (two) times daily as needed.  clopidogrel (PLAVIX) 75 MG tablet Take 75 mg by mouth daily.     docusate sodium (COLACE) 100 MG capsule Take 100 mg by mouth daily.     epoetin alfa (EPOGEN,PROCRIT) 4000 UNIT/ML injection Inject 4,000 Units into the vein See admin instructions. Mon, wed, and Friday at dialysis     guaiFENesin (ROBITUSSIN) 100 MG/5ML liquid Take 200 mg by mouth every 6 (six) hours as needed for cough.      HYDROcodone-acetaminophen (NORCO) 5-325 MG tablet Take 1 tablet by mouth every 6 (six) hours as needed for moderate pain. 20 tablet 0   hydroxychloroquine (PLAQUENIL) 200 MG tablet Take 200 mg by mouth daily.     ipratropium (ATROVENT) 0.02 % nebulizer solution Take 250 mcg by nebulization 4 (four) times daily as needed for wheezing or shortness of breath.      lidocaine-prilocaine (EMLA) cream Apply 1 application topically as needed (port access).      loratadine (CLARITIN)  10 MG tablet Take 10 mg by mouth daily as needed for allergies.     midodrine (PROAMATINE) 10 MG tablet Take 10 mg by mouth 2 (two) times daily.      mirtazapine (REMERON) 15 MG tablet Take 7.5 mg by mouth at bedtime.      multivitamin (RENA-VIT) TABS tablet Take 1 tablet by mouth daily.     pantoprazole (PROTONIX) 40 MG tablet Take 40 mg by mouth daily.     phenytoin (DILANTIN) 100 MG ER capsule Take 100 mg by mouth 2 (two) times daily.      RESTASIS 0.05 % ophthalmic emulsion      sertraline (ZOLOFT) 100 MG tablet Take 200 mg by mouth daily.      sevelamer carbonate (RENVELA) 800 MG tablet Take 800 mg by mouth See admin instructions. Take 800 mg by mouth three times daily with meals and 800 mg by mouth twice daily with snacks     simvastatin (ZOCOR) 40 MG tablet Take 40 mg by mouth at bedtime.      No current facility-administered medications for this visit.    REVIEW OF SYSTEMS:  '[X]'$  denotes positive finding, '[ ]'$  denotes negative finding Cardiac  Comments:  Chest pain or chest pressure:    Shortness of breath upon exertion:    Short of breath when lying flat:    Irregular heart rhythm:        Vascular    Pain in calf, thigh, or hip brought on by ambulation:    Pain in feet at night that wakes you up from your sleep:     Blood clot in your veins:    Leg swelling:         Pulmonary    Oxygen at home:    Productive cough:     Wheezing:         Neurologic    Sudden weakness in arms or legs:     Sudden numbness in arms or legs:     Sudden onset of difficulty speaking or slurred speech:    Temporary loss of vision in one eye:     Problems with dizziness:         Gastrointestinal    Blood in stool:     Vomited blood:         Genitourinary    Burning when urinating:     Blood in urine:        Psychiatric    Major depression:         Hematologic  Bleeding problems:    Problems with blood clotting too easily:        Skin    Rashes or ulcers:        Constitutional     Fever or chills:      PHYSICAL EXAM: Vitals:   02/05/22 1341  BP: 95/68  Pulse: 84  Temp: (!) 97.2 F (36.2 C)  SpO2: 98%  Weight: 118 lb 6.4 oz (53.7 kg)  Height: '5\' 3"'$  (1.6 m)    GENERAL: The patient is a well-nourished female, in no acute distress. The vital signs are documented above. CARDIOVASCULAR: Good bruit in her upper arm loop graft.  She has 3 different places where there is been frequent access over the last 10 years.  There is no aneurysm formation and no skin breakdown.  She had dialysis earlier today and on removing her dressings, she does have some slight oozing as expected.  She is on Plavix. PULMONARY: There is good air exchange  MUSCULOSKELETAL: There are no major deformities or cyanosis. NEUROLOGIC: No focal weakness or paresthesias are detected. SKIN: There are no ulcers or rashes noted. PSYCHIATRIC: The patient has a normal affect.  DATA:  None  MEDICAL ISSUES: Prolonged bleeding related to upper arm AV Gore-Tex hero graft.  She has had multiple interventions in the past for stenoses.  I discussed options with the patient and her caregiver present.  I would recommend fistulogram to determine if there is any technical problem with her access.  If not, her only option would be continued use with excepted occasional oozing versus abandoning this graft and placing a new right arm access.  I would certainly like to avoid this if possible since she has had excellent long-term use of her access.  We will coordinate fistulogram at [facility] with one of my partners on Tuesday or Thursday in the upcoming several weeks.   Rosetta Posner, MD FACS [facility] [phone] Pager [phone]  Note: Portions of this report may have been transcribed using voice recognition software.  Every effort has been made to ensure accuracy; however, inadvertent computerized transcription errors may still be present.

## 2022-02-08 ENCOUNTER — Other Ambulatory Visit: Payer: Self-pay

## 2022-02-08 ENCOUNTER — Encounter (HOSPITAL_COMMUNITY): Payer: Self-pay | Admitting: *Deleted

## 2022-02-08 ENCOUNTER — Inpatient Hospital Stay (HOSPITAL_COMMUNITY)
Admission: EM | Admit: 2022-02-08 | Discharge: 2022-02-10 | DRG: 252 | Disposition: A | Discharge status: Nursing Home (Includes ICF) | Payer: Medicare Other | Source: Skilled Nursing Facility | Attending: Internal Medicine | Admitting: Internal Medicine

## 2022-02-08 DIAGNOSIS — F17210 Nicotine dependence, cigarettes, uncomplicated: Secondary | ICD-10-CM | POA: Diagnosis present

## 2022-02-08 DIAGNOSIS — Z8673 Personal history of transient ischemic attack (TIA), and cerebral infarction without residual deficits: Secondary | ICD-10-CM | POA: Diagnosis not present

## 2022-02-08 DIAGNOSIS — Z7902 Long term (current) use of antithrombotics/antiplatelets: Secondary | ICD-10-CM | POA: Diagnosis not present

## 2022-02-08 DIAGNOSIS — D649 Anemia, unspecified: Secondary | ICD-10-CM

## 2022-02-08 DIAGNOSIS — D62 Acute posthemorrhagic anemia: Secondary | ICD-10-CM | POA: Diagnosis not present

## 2022-02-08 DIAGNOSIS — N186 End stage renal disease: Secondary | ICD-10-CM | POA: Diagnosis present

## 2022-02-08 DIAGNOSIS — Z992 Dependence on renal dialysis: Secondary | ICD-10-CM

## 2022-02-08 DIAGNOSIS — Z86718 Personal history of other venous thrombosis and embolism: Secondary | ICD-10-CM | POA: Diagnosis not present

## 2022-02-08 DIAGNOSIS — IMO0002 Reserved for concepts with insufficient information to code with codable children: Secondary | ICD-10-CM | POA: Diagnosis present

## 2022-02-08 DIAGNOSIS — T82837A Hemorrhage of cardiac prosthetic devices, implants and grafts, initial encounter: Secondary | ICD-10-CM

## 2022-02-08 DIAGNOSIS — R58 Hemorrhage, not elsewhere classified: Principal | ICD-10-CM

## 2022-02-08 DIAGNOSIS — F79 Unspecified intellectual disabilities: Secondary | ICD-10-CM | POA: Diagnosis present

## 2022-02-08 DIAGNOSIS — I96 Gangrene, not elsewhere classified: Secondary | ICD-10-CM | POA: Diagnosis present

## 2022-02-08 DIAGNOSIS — I9589 Other hypotension: Secondary | ICD-10-CM | POA: Diagnosis present

## 2022-02-08 DIAGNOSIS — T82838A Hemorrhage of vascular prosthetic devices, implants and grafts, initial encounter: Secondary | ICD-10-CM | POA: Diagnosis present

## 2022-02-08 DIAGNOSIS — Z841 Family history of disorders of kidney and ureter: Secondary | ICD-10-CM

## 2022-02-08 DIAGNOSIS — I120 Hypertensive chronic kidney disease with stage 5 chronic kidney disease or end stage renal disease: Secondary | ICD-10-CM | POA: Diagnosis present

## 2022-02-08 DIAGNOSIS — F419 Anxiety disorder, unspecified: Secondary | ICD-10-CM | POA: Diagnosis present

## 2022-02-08 DIAGNOSIS — Y841 Kidney dialysis as the cause of abnormal reaction of the patient, or of later complication, without mention of misadventure at the time of the procedure: Secondary | ICD-10-CM | POA: Diagnosis present

## 2022-02-08 DIAGNOSIS — E785 Hyperlipidemia, unspecified: Secondary | ICD-10-CM | POA: Diagnosis present

## 2022-02-08 DIAGNOSIS — D631 Anemia in chronic kidney disease: Secondary | ICD-10-CM | POA: Diagnosis present

## 2022-02-08 DIAGNOSIS — N185 Chronic kidney disease, stage 5: Secondary | ICD-10-CM

## 2022-02-08 DIAGNOSIS — Z7982 Long term (current) use of aspirin: Secondary | ICD-10-CM | POA: Diagnosis not present

## 2022-02-08 DIAGNOSIS — D689 Coagulation defect, unspecified: Secondary | ICD-10-CM | POA: Diagnosis present

## 2022-02-08 DIAGNOSIS — D72828 Other elevated white blood cell count: Secondary | ICD-10-CM | POA: Diagnosis present

## 2022-02-08 DIAGNOSIS — F17200 Nicotine dependence, unspecified, uncomplicated: Secondary | ICD-10-CM | POA: Diagnosis not present

## 2022-02-08 DIAGNOSIS — Z79899 Other long term (current) drug therapy: Secondary | ICD-10-CM | POA: Diagnosis not present

## 2022-02-08 DIAGNOSIS — N189 Chronic kidney disease, unspecified: Secondary | ICD-10-CM | POA: Diagnosis present

## 2022-02-08 DIAGNOSIS — G40909 Epilepsy, unspecified, not intractable, without status epilepticus: Secondary | ICD-10-CM

## 2022-02-08 DIAGNOSIS — M1990 Unspecified osteoarthritis, unspecified site: Secondary | ICD-10-CM | POA: Diagnosis present

## 2022-02-08 DIAGNOSIS — F418 Other specified anxiety disorders: Secondary | ICD-10-CM | POA: Diagnosis present

## 2022-02-08 DIAGNOSIS — Z832 Family history of diseases of the blood and blood-forming organs and certain disorders involving the immune mechanism: Secondary | ICD-10-CM

## 2022-02-08 DIAGNOSIS — F32A Depression, unspecified: Secondary | ICD-10-CM | POA: Diagnosis present

## 2022-02-08 DIAGNOSIS — Z789 Other specified health status: Secondary | ICD-10-CM

## 2022-02-08 DIAGNOSIS — M329 Systemic lupus erythematosus, unspecified: Secondary | ICD-10-CM | POA: Diagnosis present

## 2022-02-08 LAB — PREPARE RBC (CROSSMATCH)

## 2022-02-08 LAB — CBC WITH DIFFERENTIAL/PLATELET
Abs Immature Granulocytes: 0.01 10*3/uL (ref 0.00–0.07)
Basophils Absolute: 0.1 10*3/uL (ref 0.0–0.1)
Basophils Relative: 1 %
Eosinophils Absolute: 0.2 10*3/uL (ref 0.0–0.5)
Eosinophils Relative: 4 %
HCT: 29.8 % — ABNORMAL LOW (ref 36.0–46.0)
Hemoglobin: 10.1 g/dL — ABNORMAL LOW (ref 12.0–15.0)
Immature Granulocytes: 0 %
Lymphocytes Relative: 50 %
Lymphs Abs: 3 10*3/uL (ref 0.7–4.0)
MCH: 29.5 pg (ref 26.0–34.0)
MCHC: 33.9 g/dL (ref 30.0–36.0)
MCV: 87.1 fL (ref 80.0–100.0)
Monocytes Absolute: 1.1 10*3/uL — ABNORMAL HIGH (ref 0.1–1.0)
Monocytes Relative: 19 %
Neutro Abs: 1.6 10*3/uL — ABNORMAL LOW (ref 1.7–7.7)
Neutrophils Relative %: 26 %
Platelets: 221 10*3/uL (ref 150–400)
RBC: 3.42 MIL/uL — ABNORMAL LOW (ref 3.87–5.11)
RDW: 15.7 % — ABNORMAL HIGH (ref 11.5–15.5)
WBC: 6 10*3/uL (ref 4.0–10.5)
nRBC: 0 % (ref 0.0–0.2)

## 2022-02-08 LAB — BASIC METABOLIC PANEL
Anion gap: 16 — ABNORMAL HIGH (ref 5–15)
BUN: 41 mg/dL — ABNORMAL HIGH (ref 6–20)
CO2: 24 mmol/L (ref 22–32)
Calcium: 8 mg/dL — ABNORMAL LOW (ref 8.9–10.3)
Chloride: 97 mmol/L — ABNORMAL LOW (ref 98–111)
Creatinine, Ser: 7.71 mg/dL — ABNORMAL HIGH (ref 0.44–1.00)
GFR, Estimated: 6 mL/min — ABNORMAL LOW (ref >60)
Glucose, Bld: 255 mg/dL — ABNORMAL HIGH (ref 70–99)
Potassium: 3.7 mmol/L (ref 3.5–5.1)
Sodium: 137 mmol/L (ref 135–145)

## 2022-02-08 LAB — CBC
HCT: 34.2 % — ABNORMAL LOW (ref 36.0–46.0)
Hemoglobin: 11.8 g/dL — ABNORMAL LOW (ref 12.0–15.0)
MCH: 28.8 pg (ref 26.0–34.0)
MCHC: 34.5 g/dL (ref 30.0–36.0)
MCV: 83.4 fL (ref 80.0–100.0)
Platelets: 211 10*3/uL (ref 150–400)
RBC: 4.1 MIL/uL (ref 3.87–5.11)
RDW: 15.5 % (ref 11.5–15.5)
WBC: 20.1 10*3/uL — ABNORMAL HIGH (ref 4.0–10.5)
nRBC: 0 % (ref 0.0–0.2)

## 2022-02-08 MED ORDER — SENNOSIDES-DOCUSATE SODIUM 8.6-50 MG PO TABS: 1.0000 | ORAL_TABLET | Freq: Every evening | ORAL | Status: DC | PRN

## 2022-02-08 MED ORDER — MIDODRINE HCL 5 MG PO TABS
10.0000 mg | ORAL_TABLET | Freq: Two times a day (BID) | ORAL | Status: DC
  Administered 2022-02-08 – 2022-02-10 (×4): 10 mg via ORAL
  Filled 2022-02-08 (×4): qty 2

## 2022-02-08 MED ORDER — ACETAMINOPHEN 650 MG RE SUPP: 650.0000 mg | Freq: Four times a day (QID) | RECTAL | Status: DC | PRN

## 2022-02-08 MED ORDER — ACETAMINOPHEN 325 MG PO TABS: 650.0000 mg | ORAL_TABLET | Freq: Four times a day (QID) | ORAL | Status: DC | PRN

## 2022-02-08 MED ORDER — SODIUM CHLORIDE 0.9% FLUSH
3.0000 mL | Freq: Two times a day (BID) | INTRAVENOUS | Status: DC
  Administered 2022-02-08 – 2022-02-09 (×2): 3 mL via INTRAVENOUS

## 2022-02-08 MED ORDER — ONDANSETRON HCL 4 MG PO TABS: 4.0000 mg | ORAL_TABLET | Freq: Four times a day (QID) | ORAL | Status: DC | PRN

## 2022-02-08 MED ORDER — SODIUM CHLORIDE 0.9 % IV BOLUS
1000.0000 mL | Freq: Once | INTRAVENOUS | Status: AC
  Administered 2022-02-08: 1000 mL via INTRAVENOUS

## 2022-02-08 MED ORDER — SODIUM CHLORIDE 0.9 % IV SOLN: 10.0000 mL/h | Freq: Once | INTRAVENOUS | Status: DC

## 2022-02-08 MED ORDER — ONDANSETRON HCL 4 MG/2ML IJ SOLN: 4.0000 mg | Freq: Four times a day (QID) | INTRAMUSCULAR | Status: DC | PRN

## 2022-02-08 NOTE — ED Provider Notes (Signed)
[facility] EMERGENCY DEPARTMENT Provider Note   CSN: [id number] Arrival date & time: 02/08/22  7169     History  Chief Complaint  Patient presents with   Coagulation Disorder    [name] is a 46 y.o. female.  HPI This patient is a critically ill-appearing 46 year old female on dialysis, she believes that she is on dialysis Monday Wednesday and Friday.  She according to the medical record does take a baby aspirin and Plavix, no other anticoagulant medications are seen on her profile.  She presents from her facility, she lives in a adult care home with a complaint of hemorrhaging from her left upper extremity vascular fistula, the patient was seen January 23, 2022 for the same problem, at that time it was noted that she had recently had the fistula surgically created, hemoglobin was 11.8 on that day.  At that time a pressure dressing was placed  She was actually seen by Dr. Donnetta Hutching 3 days ago in the office because of a follow-up from the bleeding fistula.  It is noted that this is a hero graft and has been present for at least 9 years.  Today the patient was found in her room at her facility covered in blood, she was unresponsive and required acute resuscitation, an IV was placed in her right arm and fluids were started.  A pressure dressing was placed on the fistula and she was transported to the ER, by the time of arrival it appears that the bleeding has slowed or stopped.    Home Medications Prior to Admission medications   Medication Sig Start Date End Date Taking? Authorizing Provider  albuterol (PROVENTIL) (2.5 MG/3ML) 0.083% nebulizer solution Take 2.5 mg by nebulization 4 (four) times daily as needed for wheezing or shortness of breath.     [provider]  ALPRAZolam Duanne Moron) 0.25 MG tablet Take 0.25 mg by mouth 2 (two) times daily.    [provider]  ANTACID CALCIUM 500 MG chewable tablet Chew by mouth. 06/21/20   [provider]  ANTACID CALCIUM  500 MG chewable tablet Chew 1 tablet by mouth daily. 01/20/22   [provider]  aspirin EC 81 MG tablet Take 81 mg by mouth daily.    [provider]  chlorhexidine (PERIDEX) 0.12 % solution Use as directed 5 mLs in the mouth or throat 2 (two) times daily as needed.     [provider]  clopidogrel (PLAVIX) 75 MG tablet Take 75 mg by mouth daily.    [provider]  docusate sodium (COLACE) 100 MG capsule Take 100 mg by mouth daily.    [provider]  epoetin alfa (EPOGEN,PROCRIT) 4000 UNIT/ML injection Inject 4,000 Units into the vein See admin instructions. Mon, wed, and Friday at dialysis    [provider]  guaiFENesin (ROBITUSSIN) 100 MG/5ML liquid Take 200 mg by mouth every 6 (six) hours as needed for cough.     [provider]  HYDROcodone-acetaminophen (NORCO) 5-325 MG tablet Take 1 tablet by mouth every 6 (six) hours as needed for moderate pain. 06/01/18   Aviva Signs, MD  hydroxychloroquine (PLAQUENIL) 200 MG tablet Take 200 mg by mouth daily.    [provider]  ipratropium (ATROVENT) 0.02 % nebulizer solution Take 250 mcg by nebulization 4 (four) times daily as needed for wheezing or shortness of breath.     [provider]  lidocaine-prilocaine (EMLA) cream Apply 1 application topically as needed (port access).  12/20/12   [provider]  loratadine (CLARITIN) 10 MG tablet Take 10 mg by mouth daily as needed for allergies.    [provider]  midodrine (PROAMATINE) 10 MG tablet Take 10 mg by mouth 2 (two) times daily.     [provider]  mirtazapine (REMERON) 15 MG tablet Take 7.5 mg by mouth at bedtime.  11/03/13   [provider]  multivitamin (RENA-VIT) TABS tablet Take 1 tablet by mouth daily.    [provider]  pantoprazole (PROTONIX) 40 MG tablet Take 40 mg by mouth daily.    [provider]  phenytoin (DILANTIN) 100 MG ER capsule Take 100 mg by  mouth 2 (two) times daily.     [provider]  RESTASIS 0.05 % ophthalmic emulsion  01/31/21   [provider]  sertraline (ZOLOFT) 100 MG tablet Take 200 mg by mouth daily.     [provider]  sevelamer carbonate (RENVELA) 800 MG tablet Take 800 mg by mouth See admin instructions. Take 800 mg by mouth three times daily with meals and 800 mg by mouth twice daily with snacks    [provider]  simvastatin (ZOCOR) 40 MG tablet Take 40 mg by mouth at bedtime.     [provider]      Allergies    Dust mite extract    Review of Systems   Review of Systems  All other systems reviewed and are negative.   Physical Exam Updated Vital Signs BP (!) 86/67   Pulse 92   Temp (!) 96.8 F (36 C) (Axillary)   Resp 16   Ht 1.6 m ('5\' 3"'$ )   Wt 53 kg   SpO2 98%   BMI 20.70 kg/m  Physical Exam Constitutional:      General: She is in acute distress.     Appearance: She is ill-appearing, toxic-appearing and diaphoretic.  HENT:     Nose:     Comments: Clear    Mouth/Throat:     Comments: Dry and pale Eyes:     Comments: Conjunctive a pale, pupils normal  Cardiovascular:     Comments: Tachycardic Pulmonary:     Effort: Pulmonary effort is normal. No respiratory distress.  Abdominal:     General: Abdomen is flat. There is no distension.     Tenderness: There is no abdominal tenderness.  Musculoskeletal:     Cervical back: Neck supple.     Comments: Extremities are weak, symmetrical, no edema, fistula present in the left upper extremity is not actively bleeding but there does appear to be an ulceration with what appears to be a pulsating granula Tory mass  Lymphadenopathy:     Cervical: No cervical adenopathy.  Skin:    Coloration: Skin is pale.  Neurological:     Comments: Able to mumble her name, extremely weak, somnolent but arousable     ED Results / Procedures / Treatments   Labs (all labs ordered are listed, but only abnormal  results are displayed) Labs Reviewed  CBC WITH DIFFERENTIAL/PLATELET  BASIC METABOLIC PANEL  PREPARE RBC (CROSSMATCH)    EKG EKG Interpretation  Date/Time:  Saturday February 08 2022 16:46:28 EST Ventricular Rate:  104 PR Interval:  156 QRS Duration: 74 QT Interval:  368 QTC Calculation: 484 R Axis:   73 Text Interpretation: Sinus tachycardia Biatrial enlargement RSR' in V1 or V2, probably normal variant Baseline wander in lead(s) V4 Confirmed by Noemi Chapel [phone]) on 02/08/2022 4:55:59 PM  Radiology No results found.  Procedures .  Critical Care  Performed by: Noemi Chapel, MD Authorized by: Noemi Chapel, MD   Critical care provider statement:    Critical care time (minutes):  35   Critical care time was exclusive of:  Separately billable procedures and treating other patients and teaching time   Critical care was necessary to treat or prevent imminent or life-threatening deterioration of the following conditions:  Shock (vascular hemorrhage)   Critical care was time spent personally by me on the following activities:  Development of treatment plan with patient or surrogate, discussions with consultants, evaluation of patient's response to treatment, examination of patient, ordering and review of laboratory studies, ordering and review of radiographic studies, ordering and performing treatments and interventions, pulse oximetry, re-evaluation of patient's condition and review of old charts   I assumed direction of critical care for this patient from another provider in my specialty: no     Care discussed with: admitting provider and accepting provider at another facility   Comments:           Medications Ordered in ED Medications  0.9 %  sodium chloride infusion (has no administration in time range)  sodium chloride 0.9 % bolus 1,000 mL (1,000 mLs Intravenous New Bag/Given 02/08/22 1703)    ED Course/ Medical Decision Making/ A&P                           Medical  Decision Making Amount and/or Complexity of Data Reviewed Labs: ordered.  Risk Prescription drug management.   This patient presents to the ED for concern of hemorrhage, loss from fistula, this involves an extensive number of treatment options, and is a complaint that carries with it a high risk of complications and morbidity.  The differential diagnosis includes severe hemorrhage, according to Dr. Trula Slade who I do spoke with on the phone these grafts rarely last longer than 6 months and she has had this for approximately 9 years.  She was in process of getting this worked up with a fistulogram for possible replacement but unfortunately has hemorrhage significantly today.  She is critically ill and requiring a blood transfusion at this time   Co morbidities that complicate the patient evaluation  End-stage renal disease   Additional history obtained:  Additional history obtained from electronic medical record External records from outside source obtained and reviewed including office visits and hospitalizations from the past   Lab Tests:  I Ordered, and personally interpreted labs.  The pertinent results include: CN metabolic panel pending, transfusion is being started   Imaging is not indicated  Cardiac Monitoring: / EKG:  The patient was maintained on a cardiac monitor.  I personally viewed and interpreted the cardiac monitored which showed an underlying rhythm of: The patient is hypotensive requiring fluid resuscitation's, improving with fluids and blood transfusion.  Tachycardia improved   Consultations Obtained:  I requested consultation with the vascular surgeon Dr. Trula Slade,  and discussed lab and imaging findings as well as pertinent plan - they recommend: Pressure dressing and transferred to [facility] for formal surgical evaluation, he states he will take care of the patient when they get their Discussed with the ER physician Dr. Vivi Martens who has excepted the  patient in transfer   Problem List / ED Course / Critical interventions / Medication management  Blood transfusion I ordered medication including packed red blood cells for severe anemia and unstable hypotension Reevaluation of the patient after these medicines showed that the  patient critically ill I have reviewed the patients home medicines and have made adjustments as needed   Social Determinants of Health:  Dialysis   Test / Admission - Considered:  Will need to be seen by vascular surgeon emergently, going emergency traffic to [facility].         Final Clinical Impression(s) / ED Diagnoses Final diagnoses:  Hemorrhage  Problem with vascular access  Severe anemia    Rx / DC Orders ED Discharge Orders     None         Noemi Chapel, MD 02/08/22 1704

## 2022-02-08 NOTE — ED Notes (Signed)
Report given to [facility] 

## 2022-02-08 NOTE — ED Notes (Signed)
[facility] to [phone].[place]

## 2022-02-08 NOTE — Consult Note (Signed)
[facility] [place]  Patient name: [name] MRN: [id number] DOB: 1975-12-15 Sex: female   REQUESTING PROVIDER:    ER   REASON FOR CONSULT:    Bleeding left arm AVGG  HISTORY OF PRESENT ILLNESS:   [name] is a 46 y.o. female, who presented to the emergency department after hemorrhaging from her left upper extremity dialysis graft.  She was found at her facility covered in blood and unresponsive requiring IV fluid resuscitation.  Upon arrival to the emergency department, the bleeding had stopped.  She received 1 unit of blood at [facility] and was transferred to [facility] for further evaluation.  Upon arrival her systolic blood pressures in the 90s.  She is alert.  She was seen by Dr. Donnetta Hutching in [place] 3 days ago for prolonged bleeding after dialysis.  She initially had a HeRO graft placed in 2014.  She has had several interventions.  The graft has basically been replaced with covered stents.  3 days ago, she did not have any active ulceration.  She has been scheduled for a fistulogram.  PAST MEDICAL HISTORY    Past Medical History:  Diagnosis Date   Anemia    BV (bacterial vaginosis) 11/08/2013   Depression    Dialysis patient [facility])    DVT (deep venous thrombosis) ([place])    Hypertension    Lupus ([place])    Renal disorder    Vaginal odor 11/08/2013     FAMILY HISTORY   Family History  Problem Relation Age of Onset   Seizures Son    Kidney disease Mother        on dialysis   Lupus Mother     SOCIAL HISTORY:   Social History   Socioeconomic History   Marital status: Single    Spouse name: Not on file   Number of children: Not on file   Years of education: Not on file   Highest education level: Not on file  Occupational History   Not on file  Tobacco Use   Smoking status: Every Day    Packs/day: 0.10    Years: 15.00    Total pack years: 1.50    Types: Cigarettes   Smokeless tobacco: Never   Vaping Use   Vaping Use: Never used  Substance and Sexual Activity   Alcohol use: No   Drug use: No   Sexual activity: Not Currently    Birth control/protection: None  Other Topics Concern   Not on file  Social History Narrative   Lives at [facility]   Social Determinants of Health   Financial Resource Strain: Not on file  Food Insecurity: Not on file  Transportation Needs: Not on file  Physical Activity: Not on file  Stress: Not on file  Social Connections: Not on file  Intimate Partner Violence: Not on file    ALLERGIES:    Allergies  Allergen Reactions   Dust Mite Extract Other (See Comments)    sneezing    CURRENT MEDICATIONS:    Current Facility-Administered Medications  Medication Dose Route Frequency Provider Last Rate Last Admin   0.9 %  sodium chloride infusion  10 mL/hr Intravenous Once Noemi Chapel, MD       Current Outpatient Medications  Medication Sig Dispense Refill   albuterol (PROVENTIL) (2.5 MG/3ML) 0.083% nebulizer solution Take 2.5 mg by nebulization 4 (four) times daily as needed for wheezing or shortness of breath.      ALPRAZolam (XANAX) 0.25 MG tablet  Take 0.25 mg by mouth 2 (two) times daily.     ANTACID CALCIUM 500 MG chewable tablet Chew by mouth.     ANTACID CALCIUM 500 MG chewable tablet Chew 1 tablet by mouth daily.     aspirin EC 81 MG tablet Take 81 mg by mouth daily.     chlorhexidine (PERIDEX) 0.12 % solution Use as directed 5 mLs in the mouth or throat 2 (two) times daily as needed.      clopidogrel (PLAVIX) 75 MG tablet Take 75 mg by mouth daily.     docusate sodium (COLACE) 100 MG capsule Take 100 mg by mouth daily.     epoetin alfa (EPOGEN,PROCRIT) 4000 UNIT/ML injection Inject 4,000 Units into the vein See admin instructions. Mon, wed, and Friday at dialysis     guaiFENesin (ROBITUSSIN) 100 MG/5ML liquid Take 200 mg by mouth every 6 (six) hours as needed for cough.      HYDROcodone-acetaminophen (NORCO) 5-325 MG  tablet Take 1 tablet by mouth every 6 (six) hours as needed for moderate pain. 20 tablet 0   hydroxychloroquine (PLAQUENIL) 200 MG tablet Take 200 mg by mouth daily.     ipratropium (ATROVENT) 0.02 % nebulizer solution Take 250 mcg by nebulization 4 (four) times daily as needed for wheezing or shortness of breath.      lidocaine-prilocaine (EMLA) cream Apply 1 application topically as needed (port access).      loratadine (CLARITIN) 10 MG tablet Take 10 mg by mouth daily as needed for allergies.     midodrine (PROAMATINE) 10 MG tablet Take 10 mg by mouth 2 (two) times daily.      mirtazapine (REMERON) 15 MG tablet Take 7.5 mg by mouth at bedtime.      multivitamin (RENA-VIT) TABS tablet Take 1 tablet by mouth daily.     pantoprazole (PROTONIX) 40 MG tablet Take 40 mg by mouth daily.     phenytoin (DILANTIN) 100 MG ER capsule Take 100 mg by mouth 2 (two) times daily.      RESTASIS 0.05 % ophthalmic emulsion      sertraline (ZOLOFT) 100 MG tablet Take 200 mg by mouth daily.      sevelamer carbonate (RENVELA) 800 MG tablet Take 800 mg by mouth See admin instructions. Take 800 mg by mouth three times daily with meals and 800 mg by mouth twice daily with snacks     simvastatin (ZOCOR) 40 MG tablet Take 40 mg by mouth at bedtime.       REVIEW OF SYSTEMS:   '[X]'$  denotes positive finding, '[ ]'$  denotes negative finding Cardiac  Comments:  Chest pain or chest pressure:    Shortness of breath upon exertion:    Short of breath when lying flat:    Irregular heart rhythm:        Vascular    Pain in calf, thigh, or hip brought on by ambulation:    Pain in feet at night that wakes you up from your sleep:     Blood clot in your veins:    Leg swelling:         Pulmonary    Oxygen at home:    Productive cough:     Wheezing:         Neurologic    Sudden weakness in arms or legs:     Sudden numbness in arms or legs:     Sudden onset of difficulty speaking or slurred speech:    Temporary loss of  vision  in one eye:     Problems with dizziness:         Gastrointestinal    Blood in stool:      Vomited blood:         Genitourinary    Burning when urinating:     Blood in urine:        Psychiatric    Major depression:         Hematologic    Bleeding problems:    Problems with blood clotting too easily:        Skin    Rashes or ulcers:        Constitutional    Fever or chills:     PHYSICAL EXAM:   Vitals:   02/08/22 1742 02/08/22 1836 02/08/22 1845 02/08/22 1900  BP: (!) '89/66 93/70 93/68 '$ (!) 86/54  Pulse:  93    Resp: '17 14  19  '$ Temp: (!) 96 F (35.6 C) (!) 97.5 F (36.4 C)    TempSrc: Temporal Oral    SpO2:  100%    Weight:      Height:        GENERAL: The patient is a well-nourished female, in no acute distress. The vital signs are documented above. CARDIAC: There is a regular rate and rhythm.  VASCULAR: Ulceration with visible pulsatility of the graft PULMONARY: Nonlabored respirations ABDOMEN: Soft and non-tender with normal pitched bowel sounds.  MUSCULOSKELETAL: There are no major deformities or cyanosis. NEUROLOGIC: No focal weakness or paresthesias are detected. SKIN: There are no ulcers or rashes noted. PSYCHIATRIC: The patient has a normal affect.  STUDIES:   None  ASSESSMENT and PLAN   Ulceration with bleeding from her left upper extremity dialysis graft.  She remains somewhat hypotensive after significant blood loss.  I examined her graft and placed a pressure dressing.  I would like for her to continue with resuscitation overnight and I will plan for operative repair tomorrow morning.  She needs to be n.p.o. after midnight.   Leia Alf, MD, FACS [facility] [phone] Pager [phone]

## 2022-02-08 NOTE — H&P (View-Only) (Signed)
[facility] [place]  Patient name: [name] MRN: [id number] DOB: 1975-07-21 Sex: female   REQUESTING PROVIDER:    ER   REASON FOR CONSULT:    Bleeding left arm AVGG  HISTORY OF PRESENT ILLNESS:   [name] is a 46 y.o. female, who presented to the emergency department after hemorrhaging from her left upper extremity dialysis graft.  She was found at her facility covered in blood and unresponsive requiring IV fluid resuscitation.  Upon arrival to the emergency department, the bleeding had stopped.  She received 1 unit of blood at [facility] and was transferred to [facility] for further evaluation.  Upon arrival her systolic blood pressures in the 90s.  She is alert.  She was seen by Dr. Donnetta Hutching in [place] 3 days ago for prolonged bleeding after dialysis.  She initially had a HeRO graft placed in 2014.  She has had several interventions.  The graft has basically been replaced with covered stents.  3 days ago, she did not have any active ulceration.  She has been scheduled for a fistulogram.  PAST MEDICAL HISTORY    Past Medical History:  Diagnosis Date   Anemia    BV (bacterial vaginosis) 11/08/2013   Depression    Dialysis patient [facility])    DVT (deep venous thrombosis) ([place])    Hypertension    Lupus ([place])    Renal disorder    Vaginal odor 11/08/2013     FAMILY HISTORY   Family History  Problem Relation Age of Onset   Seizures Son    Kidney disease Mother        on dialysis   Lupus Mother     SOCIAL HISTORY:   Social History   Socioeconomic History   Marital status: Single    Spouse name: Not on file   Number of children: Not on file   Years of education: Not on file   Highest education level: Not on file  Occupational History   Not on file  Tobacco Use   Smoking status: Every Day    Packs/day: 0.10    Years: 15.00    Total pack years: 1.50    Types: Cigarettes   Smokeless tobacco: Never   Vaping Use   Vaping Use: Never used  Substance and Sexual Activity   Alcohol use: No   Drug use: No   Sexual activity: Not Currently    Birth control/protection: None  Other Topics Concern   Not on file  Social History Narrative   Lives at [facility]   Social Determinants of Health   Financial Resource Strain: Not on file  Food Insecurity: Not on file  Transportation Needs: Not on file  Physical Activity: Not on file  Stress: Not on file  Social Connections: Not on file  Intimate Partner Violence: Not on file    ALLERGIES:    Allergies  Allergen Reactions   Dust Mite Extract Other (See Comments)    sneezing    CURRENT MEDICATIONS:    Current Facility-Administered Medications  Medication Dose Route Frequency Provider Last Rate Last Admin   0.9 %  sodium chloride infusion  10 mL/hr Intravenous Once Noemi Chapel, MD       Current Outpatient Medications  Medication Sig Dispense Refill   albuterol (PROVENTIL) (2.5 MG/3ML) 0.083% nebulizer solution Take 2.5 mg by nebulization 4 (four) times daily as needed for wheezing or shortness of breath.      ALPRAZolam (XANAX) 0.25 MG tablet  Take 0.25 mg by mouth 2 (two) times daily.     ANTACID CALCIUM 500 MG chewable tablet Chew by mouth.     ANTACID CALCIUM 500 MG chewable tablet Chew 1 tablet by mouth daily.     aspirin EC 81 MG tablet Take 81 mg by mouth daily.     chlorhexidine (PERIDEX) 0.12 % solution Use as directed 5 mLs in the mouth or throat 2 (two) times daily as needed.      clopidogrel (PLAVIX) 75 MG tablet Take 75 mg by mouth daily.     docusate sodium (COLACE) 100 MG capsule Take 100 mg by mouth daily.     epoetin alfa (EPOGEN,PROCRIT) 4000 UNIT/ML injection Inject 4,000 Units into the vein See admin instructions. Mon, wed, and Friday at dialysis     guaiFENesin (ROBITUSSIN) 100 MG/5ML liquid Take 200 mg by mouth every 6 (six) hours as needed for cough.      HYDROcodone-acetaminophen (NORCO) 5-325 MG  tablet Take 1 tablet by mouth every 6 (six) hours as needed for moderate pain. 20 tablet 0   hydroxychloroquine (PLAQUENIL) 200 MG tablet Take 200 mg by mouth daily.     ipratropium (ATROVENT) 0.02 % nebulizer solution Take 250 mcg by nebulization 4 (four) times daily as needed for wheezing or shortness of breath.      lidocaine-prilocaine (EMLA) cream Apply 1 application topically as needed (port access).      loratadine (CLARITIN) 10 MG tablet Take 10 mg by mouth daily as needed for allergies.     midodrine (PROAMATINE) 10 MG tablet Take 10 mg by mouth 2 (two) times daily.      mirtazapine (REMERON) 15 MG tablet Take 7.5 mg by mouth at bedtime.      multivitamin (RENA-VIT) TABS tablet Take 1 tablet by mouth daily.     pantoprazole (PROTONIX) 40 MG tablet Take 40 mg by mouth daily.     phenytoin (DILANTIN) 100 MG ER capsule Take 100 mg by mouth 2 (two) times daily.      RESTASIS 0.05 % ophthalmic emulsion      sertraline (ZOLOFT) 100 MG tablet Take 200 mg by mouth daily.      sevelamer carbonate (RENVELA) 800 MG tablet Take 800 mg by mouth See admin instructions. Take 800 mg by mouth three times daily with meals and 800 mg by mouth twice daily with snacks     simvastatin (ZOCOR) 40 MG tablet Take 40 mg by mouth at bedtime.       REVIEW OF SYSTEMS:   '[X]'$  denotes positive finding, '[ ]'$  denotes negative finding Cardiac  Comments:  Chest pain or chest pressure:    Shortness of breath upon exertion:    Short of breath when lying flat:    Irregular heart rhythm:        Vascular    Pain in calf, thigh, or hip brought on by ambulation:    Pain in feet at night that wakes you up from your sleep:     Blood clot in your veins:    Leg swelling:         Pulmonary    Oxygen at home:    Productive cough:     Wheezing:         Neurologic    Sudden weakness in arms or legs:     Sudden numbness in arms or legs:     Sudden onset of difficulty speaking or slurred speech:    Temporary loss of  vision  in one eye:     Problems with dizziness:         Gastrointestinal    Blood in stool:      Vomited blood:         Genitourinary    Burning when urinating:     Blood in urine:        Psychiatric    Major depression:         Hematologic    Bleeding problems:    Problems with blood clotting too easily:        Skin    Rashes or ulcers:        Constitutional    Fever or chills:     PHYSICAL EXAM:   Vitals:   02/08/22 1742 02/08/22 1836 02/08/22 1845 02/08/22 1900  BP: (!) '89/66 93/70 93/68 '$ (!) 86/54  Pulse:  93    Resp: '17 14  19  '$ Temp: (!) 96 F (35.6 C) (!) 97.5 F (36.4 C)    TempSrc: Temporal Oral    SpO2:  100%    Weight:      Height:        GENERAL: The patient is a well-nourished female, in no acute distress. The vital signs are documented above. CARDIAC: There is a regular rate and rhythm.  VASCULAR: Ulceration with visible pulsatility of the graft PULMONARY: Nonlabored respirations ABDOMEN: Soft and non-tender with normal pitched bowel sounds.  MUSCULOSKELETAL: There are no major deformities or cyanosis. NEUROLOGIC: No focal weakness or paresthesias are detected. SKIN: There are no ulcers or rashes noted. PSYCHIATRIC: The patient has a normal affect.  STUDIES:   None  ASSESSMENT and PLAN   Ulceration with bleeding from her left upper extremity dialysis graft.  She remains somewhat hypotensive after significant blood loss.  I examined her graft and placed a pressure dressing.  I would like for her to continue with resuscitation overnight and I will plan for operative repair tomorrow morning.  She needs to be n.p.o. after midnight.   Leia Alf, MD, FACS [facility] [phone] Pager [phone]

## 2022-02-08 NOTE — ED Notes (Signed)
Consent obtained from legal guardian on call, [name] stating pt can have emergency blood

## 2022-02-08 NOTE — Hospital Course (Signed)
[name] is a 46 y.o. female with medical history significant for ESRD on MWF HD, lupus, chronic hypotension, HLD, depression/anxiety, intellectual disability who is admitted for management of hemorrhage from LUE AV graft associated with hypotension and brief unresponsive episode.

## 2022-02-08 NOTE — ED Notes (Signed)
Vascular at bedside. Dressing removed by vascular MD. Pressure dressing applied at this time. No active bleeding at site.

## 2022-02-08 NOTE — ED Triage Notes (Signed)
Pt brought in by [facility] for c/o bleeding to left arm  Staff reported to ems pt was found unresponsive lying in the bed bleeding from her dialysis port.  Pt O2 sats found to be 60%, ems placed pt on NRB and O2 sats increased to 90's  20G IV to right hand  14m of NS en route  Cbg 218

## 2022-02-08 NOTE — Assessment & Plan Note (Signed)
Acute on chronic hypotension due to hemorrhage from LUE AV graft.  Received 1 L NS and 1 unit PRBC with BP stabilized 90s/60s and MAP 70.  Appears to be on chronic midodrine. -Continue midodrine 10 mg BID

## 2022-02-08 NOTE — Assessment & Plan Note (Signed)
Initial hemoglobin stable at 10.1.  Follow repeat CBC.

## 2022-02-08 NOTE — ED Notes (Signed)
RN spoke with [place] administrator of [facility] care where patient is resident but was unable to give medical information. Patient gave RN consent to tell [facility] general information about patient care RN was unable to contact [facility] on the phone.

## 2022-02-08 NOTE — Assessment & Plan Note (Addendum)
Found unresponsive and hypotensive with significant bleeding from LUE AV graft at her ALF.  Transfused 1 unit PRBC with improvement.  Bleeding from graft has stopped and pressure dressing placed by vascular surgery.  On aspirin and Plavix chronically. -Vascular surgery following -Plan for graft revision 11/26 AM -Hold aspirin/Plavix -N.p.o. after midnight -Follow repeat CBC

## 2022-02-08 NOTE — Assessment & Plan Note (Signed)
Hold home Xanax for now.

## 2022-02-08 NOTE — Assessment & Plan Note (Signed)
Med rec pending, appears to have been on Plaquenil.

## 2022-02-08 NOTE — ED Provider Notes (Signed)
  Physical Exam  BP (!) 86/54   Pulse 93   Temp (!) 97.5 F (36.4 C) (Oral)   Resp 19   Ht '5\' 3"'$  (1.6 m)   Wt 53 kg   SpO2 100%   BMI 20.70 kg/m   Physical Exam  Procedures  .Critical Care  Performed by: Varney Biles, MD Authorized by: Varney Biles, MD   Critical care provider statement:    Critical care time (minutes):  32   Critical care was necessary to treat or prevent imminent or life-threatening deterioration of the following conditions:  Circulatory failure   Critical care was time spent personally by me on the following activities:  Development of treatment plan with patient or surrogate, discussions with consultants, evaluation of patient's response to treatment, examination of patient, ordering and review of laboratory studies, ordering and review of radiographic studies, ordering and performing treatments and interventions, pulse oximetry, re-evaluation of patient's condition, review of old charts and obtaining history from patient or surrogate   ED Course / MDM    Medical Decision Making Amount and/or Complexity of Data Reviewed Labs: ordered.  Risk Prescription drug management. Decision regarding hospitalization.   Patient transferred from [facility] emergency room with chief complaint of severe bleeding from left AV fistula.  Patient is 41.  She has history of ESRD with vascular access in the left upper extremity.  She also has history of DVT, lupus, hypertension.  She is on aspirin and Plavix.  Patient has intellectual delay.  She is unsure if she is on any blood thinners or not.  She was transferred from [facility] emergency room with severe bleeding from her fistula site.  Patient seen upon arrival.  She has a dressing around her, no active bleeding noted. Vascular surgery consulted.   7:38 PM Discussed case with Dr. Trula Slade, vascular surgery.  He has seen the patient.  Bleeding has stopped with the pressure, he has applied new pressure  dressing. Plan is for him to take the patient to the OR first thing tomorrow morning.  Patient's BP is low.  It has been consistently less than 90 systolic.  She denies any dizziness right now. Repeat CBC ordered.  Type and screen sent with it. She has received 1 unit of PRBC.  We will request stepdown admission.  Will likely need another unit of blood.      Varney Biles, MD 02/08/22 [phone]

## 2022-02-08 NOTE — Assessment & Plan Note (Signed)
Reported on MWF HD schedule with last HD 11/24.  No emergent need for dialysis.  Will need nephrology consult if remains in hospital for routine HD next due 11/27.

## 2022-02-08 NOTE — Assessment & Plan Note (Signed)
Appears to have been on phenytoin.  Med rec pending.

## 2022-02-08 NOTE — ED Notes (Signed)
Pt arrives from [facility] via [facility]. Per [facility], pt was found bleeding in her bed from her HD fistula in her L arm. Pt was unresponsive. Ladst BP 89/54. Given 1 unit PRBC's PTA. Pt arrives with 2 PIV sites with bleeding controlled. Pt a/ox4 on arrival, GCS of 15, c/o gen. weakness.

## 2022-02-08 NOTE — H&P (Addendum)
History and Physical    [name] KXF:[id number] DOB: [birth year] DOA: 02/08/2022  PCP: Carrolyn Meiers, MD  [facility] coming from[facility] I have personally briefly reviewed patient's old medical records in [place]  Chief Complaint: Bleeding from LUE AV graft  HPI: [name] is a 46 y.o. female with medical history significant for ESRD on MWF HD, lupus, chronic hypotension, HLD, depression/anxiety, intellectual disability who presented to the ED from her living facility after she was found to be unresponsive with bleeding from her LUE AV graft.  History is somewhat limited from patient due to intellectual disability and is otherwise supplemented by EDP and chart review.  Patient currently resides at an assisted living facility.  She had recent issues with bleeding from her LUE AV graft with plans for outpatient fistulogram in the next few weeks.  She was found unresponsive at her facility earlier today covered in blood with bleeding from her graft.  EMS were called and she required IV fluid resuscitation.  Pressure dressing was placed on the fistula and she was taken Forestine Na, ED.  According to medical records patient is chronically taking aspirin 81 mg and Plavix 75 mg daily, unclear specific indication but remote documentation suggest history of CVA.  Patient denies any other obvious bleeding.  She tells me she does dialysis Monday Tuesday Wednesday but last dialysis was yesterday (Friday 11/24).  ED Course  Labs/Imaging on admission: I have personally reviewed following labs and imaging studies.  Initially presented to [facility] ED. Vitals showed BP 76/51, pulse 88, RR 17, temp 96.8 F, SpO2 100% on room air.  Labs show hemoglobin 10.1, platelets 221,000, WBC 6.0, sodium 137, testing 3.7, bicarb 24, BUN 41, creatinine 7.71, serum glucose 255.  She was given 1 L normal saline and transfused 1 unit PRBC with another unit ordered  for transfusion.  She was transferred emergently ED to ED to [facility] for vascular surgery evaluation.  Seen by Dr. Trula Slade who recommends admission for further resuscitation with plan for dialysis graft revision tomorrow.  The hospitalist service was consulted to admit for further evaluation and management.  Review of Systems:  All systems reviewed and are negative except as documented in history of present illness above.   Past Medical History:  Diagnosis Date   Anemia    BV (bacterial vaginosis) 11/08/2013   Depression    Dialysis patient [place])    DVT (deep venous thrombosis) ([place])    Hypertension    Lupus ([place])    Renal disorder    Vaginal odor 11/08/2013    Past Surgical History:  Procedure Laterality Date   A/V SHUNT INTERVENTION N/A 10/24/2016   Procedure: A/V SHUNT INTERVENTION;  Surgeon: Katha Cabal, MD;  Location: [place] [facility];  Service: Cardiovascular;  Laterality: N/A;   A/V SHUNTOGRAM Left 12/08/2017   Procedure: A/V SHUNTOGRAM;  Surgeon: Katha Cabal, MD;  Location: [place] [facility];  Service: Cardiovascular;  Laterality: Left;   A/V SHUNTOGRAM Left 01/28/2018   Procedure: A/V SHUNTOGRAM;  Surgeon: Algernon Huxley, MD;  Location: [place] [facility];  Service: Cardiovascular;  Laterality: Left;   A/V SHUNTOGRAM Left 04/01/2018   Procedure: A/V SHUNTOGRAM;  Surgeon: Algernon Huxley, MD;  Location: [place] [facility];  Service: Cardiovascular;  Laterality: Left;   A/V SHUNTOGRAM Left 04/28/2019   Procedure: A/V SHUNTOGRAM;  Surgeon: Algernon Huxley, MD;  Location: [place] [facility];  Service: Cardiovascular;  Laterality:  Left;   AV FISTULA PLACEMENT     DIALYSIS/PERMA CATHETER INSERTION  10/23/2016   Procedure: DIALYSIS/PERMA CATHETER INSERTION;  Surgeon: Algernon Huxley, MD;  Location: [place] [facility];  Service: Cardiovascular;;   IR REMOVAL TUN CV CATH W/O FL  09/22/2017   IR THROMBECTOMY AV FISTULA W/THROMBOLYSIS INC/SHUNT/IMG LEFT Left 09/04/2017    IR US GUIDE VASC ACCESS LEFT  09/04/2017   MASS EXCISION Left 06/01/2018   Procedure: EXCISION 5 X 3CM LIPOMA LEFT THIGH;  Surgeon: Aviva Signs, MD;  Location: [facility];  Service: General;  Laterality: Left;   PERIPHERAL VASCULAR CATHETERIZATION Left 01/04/2015   Procedure: A/V Shuntogram/Fistulagram;  Surgeon: Algernon Huxley, MD;  Location: [place] [facility];  Service: Cardiovascular;  Laterality: Left;   PERIPHERAL VASCULAR CATHETERIZATION N/A 01/04/2015   Procedure: A/V Shunt Intervention;  Surgeon: Algernon Huxley, MD;  Location: [place] [facility];  Service: Cardiovascular;  Laterality: N/A;   PERIPHERAL VASCULAR CATHETERIZATION Left 04/13/2015   Procedure: A/V Shuntogram/Fistulagram;  Surgeon: Katha Cabal, MD;  Location: [place] [facility];  Service: Cardiovascular;  Laterality: Left;   PERIPHERAL VASCULAR CATHETERIZATION Left 04/13/2015   Procedure: A/V Shunt Intervention;  Surgeon: Katha Cabal, MD;  Location: [place] [facility];  Service: Cardiovascular;  Laterality: Left;    Social History:  reports that she has been smoking cigarettes. She has a 1.50 pack-year smoking history. She has never used smokeless tobacco. She reports that she does not drink alcohol and does not use drugs.  Allergies  Allergen Reactions   Dust Mite Extract Other (See Comments)    sneezing    Family History  Problem Relation Age of Onset   Seizures Son    Kidney disease Mother        on dialysis   Lupus Mother      Prior to Admission medications   Medication Sig Start Date End Date Taking? Authorizing Provider  albuterol (PROVENTIL) (2.5 MG/3ML) 0.083% nebulizer solution Take 2.5 mg by nebulization 4 (four) times daily as needed for wheezing or shortness of breath.     [provider]  ALPRAZolam Duanne Moron) 0.25 MG tablet Take 0.25 mg by mouth 2 (two) times daily.    [provider]  ANTACID CALCIUM 500 MG chewable tablet Chew by mouth. 06/21/20   [provider]   ANTACID CALCIUM 500 MG chewable tablet Chew 1 tablet by mouth daily. 01/20/22   [provider]  aspirin EC 81 MG tablet Take 81 mg by mouth daily.    [provider]  chlorhexidine (PERIDEX) 0.12 % solution Use as directed 5 mLs in the mouth or throat 2 (two) times daily as needed.     [provider]  clopidogrel (PLAVIX) 75 MG tablet Take 75 mg by mouth daily.    [provider]  docusate sodium (COLACE) 100 MG capsule Take 100 mg by mouth daily.    [provider]  epoetin alfa (EPOGEN,PROCRIT) 4000 UNIT/ML injection Inject 4,000 Units into the vein See admin instructions. Mon, wed, and Friday at dialysis    [provider]  guaiFENesin (ROBITUSSIN) 100 MG/5ML liquid Take 200 mg by mouth every 6 (six) hours as needed for cough.     [provider]  HYDROcodone-acetaminophen (NORCO) 5-325 MG tablet Take 1 tablet by mouth every 6 (six) hours as needed for moderate pain. 06/01/18   Aviva Signs, MD  hydroxychloroquine (PLAQUENIL) 200 MG tablet Take 200 mg by mouth daily.  [provider]  ipratropium (ATROVENT) 0.02 % nebulizer solution Take 250 mcg by nebulization 4 (four) times daily as needed for wheezing or shortness of breath.     [provider]  lidocaine-prilocaine (EMLA) cream Apply 1 application topically as needed (port access).  12/20/12   [provider]  loratadine (CLARITIN) 10 MG tablet Take 10 mg by mouth daily as needed for allergies.    [provider]  midodrine (PROAMATINE) 10 MG tablet Take 10 mg by mouth 2 (two) times daily.     [provider]  mirtazapine (REMERON) 15 MG tablet Take 7.5 mg by mouth at bedtime.  11/03/13   [provider]  multivitamin (RENA-VIT) TABS tablet Take 1 tablet by mouth daily.    [provider]  pantoprazole (PROTONIX) 40 MG tablet Take 40 mg by mouth daily.    [provider]  phenytoin (DILANTIN) 100 MG ER  capsule Take 100 mg by mouth 2 (two) times daily.     [provider]  RESTASIS 0.05 % ophthalmic emulsion  01/31/21   [provider]  sertraline (ZOLOFT) 100 MG tablet Take 200 mg by mouth daily.     [provider]  sevelamer carbonate (RENVELA) 800 MG tablet Take 800 mg by mouth See admin instructions. Take 800 mg by mouth three times daily with meals and 800 mg by mouth twice daily with snacks    [provider]  simvastatin (ZOCOR) 40 MG tablet Take 40 mg by mouth at bedtime.     [provider]    Physical Exam: Vitals:   02/08/22 1836 02/08/22 1845 02/08/22 1900 02/08/22 2016  BP: 93/70 93/68 (!) 86/54 91/62  Pulse: 93   92  Resp: '14  19 18  '$ Temp: (!) 97.5 F (36.4 C)   (!) 97.5 F (36.4 C)  TempSrc: Oral   Oral  SpO2: 100%   100%  Weight:      Height:       Constitutional: Resting in bed, NAD, calm, comfortable Eyes: EOMI, lids and conjunctivae normal ENMT: Mucous membranes are moist. Posterior pharynx clear of any exudate or lesions.Normal dentition.  Neck: normal, supple, no masses. Respiratory: clear to auscultation bilaterally, no wheezing, no crackles. Normal respiratory effort. No accessory muscle use.  Cardiovascular: Regular rate and rhythm, no murmurs / rubs / gallops.  LUE AV graft with pressure dressing in place. Abdomen: no tenderness, no masses palpated. Musculoskeletal: no clubbing / cyanosis. No joint deformity upper and lower extremities. Good ROM, no contractures. Normal muscle tone.  Skin: no rashes, lesions, ulcers. No induration Neurologic: Sensation intact. Strength 5/5 in all 4.  Psychiatric: Alert and oriented x 3.  Flat affect.  EKG: Personally reviewed. Sinus tachycardia, rate 104, biatrial enlargement.  Rate is faster when compared to prior.  Assessment/Plan Principal Problem:   Hemorrhage from arteriovenous dialysis graft (HCC) Active Problems:   ESRD on hemodialysis (HCC)   Chronic hypotension    Depression with anxiety   Lupus ([place])   Anemia in chronic kidney disease   Seizure disorder ([place])   [name] is a 46 y.o. female with medical history significant for ESRD on MWF HD, lupus, chronic hypotension, HLD, depression/anxiety, intellectual disability who is admitted for management of hemorrhage from LUE AV graft associated with hypotension and brief unresponsive episode.  Assessment and Plan: * Hemorrhage from arteriovenous dialysis graft (HCC) Found unresponsive and hypotensive with significant bleeding from LUE AV graft at her ALF.  Transfused 1 unit  PRBC with improvement.  Bleeding from graft has stopped and pressure dressing placed by vascular surgery.  On aspirin and Plavix chronically. -Vascular surgery following -Plan for graft revision 11/26 AM -Hold aspirin/Plavix -N.p.o. after midnight -Follow repeat CBC  ESRD on hemodialysis ([place]) Reported on MWF HD schedule with last HD 11/24.  No emergent need for dialysis.  Will need nephrology consult if remains in hospital for routine HD next due 11/27.  Chronic hypotension Acute on chronic hypotension due to hemorrhage from LUE AV graft.  Received 1 L NS and 1 unit PRBC with BP stabilized 90s/60s and MAP 70.  Appears to be on chronic midodrine. -Continue midodrine 10 mg BID  Seizure disorder (HCC) Appears to have been on phenytoin.  Med rec pending.  Anemia in chronic kidney disease Initial hemoglobin stable at 10.1.  Follow repeat CBC.  Lupus ([place]) Med rec pending, appears to have been on Plaquenil.  Depression with anxiety Hold home Xanax for now.  DVT prophylaxis: SCDs Start: 02/08/22 2038 Code Status: Full code Family Communication: None available on admission, unable to reach primary contact/legal guardian by phone Disposition Plan: From assisted living, dispo pending clinical progress Consults called: Vascular surgery Severity of Illness: The appropriate patient status for this patient is INPATIENT.  Inpatient status is judged to be reasonable and necessary in order to provide the required intensity of service to ensure the patient's safety. The patient's presenting symptoms, physical exam findings, and initial radiographic and laboratory data in the context of their chronic comorbidities is felt to place them at high risk for further clinical deterioration. Furthermore, it is not anticipated that the patient will be medically stable for discharge from the hospital within 2 midnights of admission.   * I certify that at the point of admission it is my clinical judgment that the patient will require inpatient hospital care spanning beyond 2 midnights from the point of admission due to high intensity of service, high risk for further deterioration and high frequency of surveillance required.Zada Finders MD [facility]  If 7PM-7AM, please contact night-coverage [email or web address]  02/08/2022, 8:53 PM

## 2022-02-09 ENCOUNTER — Encounter (HOSPITAL_COMMUNITY)
Admission: EM | Disposition: A | Discharge status: Nursing Home (Includes ICF) | Payer: Self-pay | Source: Skilled Nursing Facility | Attending: Internal Medicine

## 2022-02-09 ENCOUNTER — Inpatient Hospital Stay (HOSPITAL_COMMUNITY): Payer: Medicare Other

## 2022-02-09 ENCOUNTER — Other Ambulatory Visit: Payer: Self-pay

## 2022-02-09 ENCOUNTER — Inpatient Hospital Stay (HOSPITAL_COMMUNITY): Payer: Medicare Other | Admitting: Certified Registered Nurse Anesthetist

## 2022-02-09 ENCOUNTER — Encounter (HOSPITAL_COMMUNITY): Payer: Self-pay | Admitting: Internal Medicine

## 2022-02-09 DIAGNOSIS — F17200 Nicotine dependence, unspecified, uncomplicated: Secondary | ICD-10-CM

## 2022-02-09 DIAGNOSIS — N186 End stage renal disease: Secondary | ICD-10-CM

## 2022-02-09 DIAGNOSIS — T82838A Hemorrhage of vascular prosthetic devices, implants and grafts, initial encounter: Secondary | ICD-10-CM | POA: Diagnosis not present

## 2022-02-09 DIAGNOSIS — I120 Hypertensive chronic kidney disease with stage 5 chronic kidney disease or end stage renal disease: Secondary | ICD-10-CM

## 2022-02-09 HISTORY — PX: AV FISTULA PLACEMENT: SHX1204

## 2022-02-09 LAB — I-STAT BETA HCG BLOOD, ED (MC, WL, AP ONLY): I-stat hCG, quantitative: <5 m[IU]/mL (ref <5)

## 2022-02-09 LAB — HIV ANTIBODY (ROUTINE TESTING W REFLEX): HIV Screen 4th Generation wRfx: NONREACTIVE

## 2022-02-09 LAB — CBC
HCT: 29.3 % — ABNORMAL LOW (ref 36.0–46.0)
Hemoglobin: 10.4 g/dL — ABNORMAL LOW (ref 12.0–15.0)
MCH: 29.1 pg (ref 26.0–34.0)
MCHC: 35.5 g/dL (ref 30.0–36.0)
MCV: 82.1 fL (ref 80.0–100.0)
Platelets: 194 10*3/uL (ref 150–400)
RBC: 3.57 MIL/uL — ABNORMAL LOW (ref 3.87–5.11)
RDW: 15.8 % — ABNORMAL HIGH (ref 11.5–15.5)
WBC: 10 10*3/uL (ref 4.0–10.5)
nRBC: 0 % (ref 0.0–0.2)

## 2022-02-09 LAB — BASIC METABOLIC PANEL
Anion gap: 15 (ref 5–15)
BUN: 44 mg/dL — ABNORMAL HIGH (ref 6–20)
CO2: 27 mmol/L (ref 22–32)
Calcium: 8.9 mg/dL (ref 8.9–10.3)
Chloride: 96 mmol/L — ABNORMAL LOW (ref 98–111)
Creatinine, Ser: 8.96 mg/dL — ABNORMAL HIGH (ref 0.44–1.00)
GFR, Estimated: 5 mL/min — ABNORMAL LOW (ref >60)
Glucose, Bld: 85 mg/dL (ref 70–99)
Potassium: 4.7 mmol/L (ref 3.5–5.1)
Sodium: 138 mmol/L (ref 135–145)

## 2022-02-09 LAB — HEPATITIS B SURFACE ANTIGEN: Hepatitis B Surface Ag: NONREACTIVE

## 2022-02-09 SURGERY — INSERTION OF ARTERIOVENOUS (AV) GORE-TEX GRAFT ARM
Anesthesia: General | Site: Arm Upper | Laterality: Left

## 2022-02-09 MED ORDER — 0.9 % SODIUM CHLORIDE (POUR BTL) OPTIME
TOPICAL | Status: DC | PRN
  Administered 2022-02-09: 1000 mL

## 2022-02-09 MED ORDER — PHENYLEPHRINE HCL-NACL 20-0.9 MG/250ML-% IV SOLN
INTRAVENOUS | Status: DC | PRN
  Administered 2022-02-09: 50 ug/min via INTRAVENOUS

## 2022-02-09 MED ORDER — PHENYLEPHRINE 80 MCG/ML (10ML) SYRINGE FOR IV PUSH (FOR BLOOD PRESSURE SUPPORT)
PREFILLED_SYRINGE | INTRAVENOUS | Status: AC
  Filled 2022-02-09: qty 10

## 2022-02-09 MED ORDER — ALBUMIN HUMAN 5 % IV SOLN
12.5000 g | Freq: Once | INTRAVENOUS | Status: AC
  Administered 2022-02-09: 12.5 g via INTRAVENOUS

## 2022-02-09 MED ORDER — CHLORHEXIDINE GLUCONATE 0.12 % MT SOLN
OROMUCOSAL | Status: AC
  Administered 2022-02-09: 15 mL via OROMUCOSAL
  Filled 2022-02-09: qty 15

## 2022-02-09 MED ORDER — ALBUMIN HUMAN 5 % IV SOLN: INTRAVENOUS | Status: DC | PRN

## 2022-02-09 MED ORDER — CEFAZOLIN SODIUM-DEXTROSE 2-3 GM-%(50ML) IV SOLR
INTRAVENOUS | Status: DC | PRN
  Administered 2022-02-09: 2 g via INTRAVENOUS

## 2022-02-09 MED ORDER — FENTANYL CITRATE (PF) 250 MCG/5ML IJ SOLN
INTRAMUSCULAR | Status: AC
  Filled 2022-02-09: qty 5

## 2022-02-09 MED ORDER — CEFAZOLIN SODIUM 1 G IJ SOLR
INTRAMUSCULAR | Status: AC
  Filled 2022-02-09: qty 20

## 2022-02-09 MED ORDER — DEXAMETHASONE SODIUM PHOSPHATE 10 MG/ML IJ SOLN
INTRAMUSCULAR | Status: DC | PRN
  Administered 2022-02-09: 4 mg via INTRAVENOUS

## 2022-02-09 MED ORDER — LIDOCAINE 2% (20 MG/ML) 5 ML SYRINGE
INTRAMUSCULAR | Status: DC | PRN
  Administered 2022-02-09: 60 mg via INTRAVENOUS

## 2022-02-09 MED ORDER — OXYCODONE HCL 5 MG/5ML PO SOLN
5.0000 mg | Freq: Once | ORAL | Status: AC | PRN
  Administered 2022-02-09: 5 mg via ORAL

## 2022-02-09 MED ORDER — PROPOFOL 10 MG/ML IV BOLUS
INTRAVENOUS | Status: DC | PRN
  Administered 2022-02-09: 150 mg via INTRAVENOUS

## 2022-02-09 MED ORDER — DEXAMETHASONE SODIUM PHOSPHATE 10 MG/ML IJ SOLN
INTRAMUSCULAR | Status: AC
  Filled 2022-02-09: qty 1

## 2022-02-09 MED ORDER — CHLORHEXIDINE GLUCONATE CLOTH 2 % EX PADS
6.0000 | MEDICATED_PAD | Freq: Every day | CUTANEOUS | Status: DC
  Administered 2022-02-10: 6 via TOPICAL

## 2022-02-09 MED ORDER — VASOPRESSIN 20 UNIT/ML IV SOLN
INTRAVENOUS | Status: AC
  Filled 2022-02-09: qty 1

## 2022-02-09 MED ORDER — OXYCODONE HCL 5 MG/5ML PO SOLN
ORAL | Status: AC
  Filled 2022-02-09: qty 5

## 2022-02-09 MED ORDER — ONDANSETRON HCL 4 MG/2ML IJ SOLN
INTRAMUSCULAR | Status: DC | PRN
  Administered 2022-02-09: 4 mg via INTRAVENOUS

## 2022-02-09 MED ORDER — IODIXANOL 320 MG/ML IV SOLN
INTRAVENOUS | Status: DC | PRN
  Administered 2022-02-09: 20 mL

## 2022-02-09 MED ORDER — ONDANSETRON HCL 4 MG/2ML IJ SOLN
INTRAMUSCULAR | Status: AC
  Filled 2022-02-09: qty 2

## 2022-02-09 MED ORDER — PROMETHAZINE HCL 25 MG/ML IJ SOLN: 6.2500 mg | INTRAMUSCULAR | Status: DC | PRN

## 2022-02-09 MED ORDER — HEPARIN 6000 UNIT IRRIGATION SOLUTION
Status: AC
  Filled 2022-02-09: qty 500

## 2022-02-09 MED ORDER — GLYCOPYRROLATE 0.2 MG/ML IJ SOLN
INTRAMUSCULAR | Status: DC | PRN
  Administered 2022-02-09: .2 mg via INTRAVENOUS

## 2022-02-09 MED ORDER — PROPOFOL 10 MG/ML IV BOLUS
INTRAVENOUS | Status: AC
  Filled 2022-02-09: qty 20

## 2022-02-09 MED ORDER — HEPARIN SODIUM (PORCINE) 1000 UNIT/ML IJ SOLN
INTRAMUSCULAR | Status: AC
  Filled 2022-02-09: qty 10

## 2022-02-09 MED ORDER — FENTANYL CITRATE (PF) 100 MCG/2ML IJ SOLN: 25.0000 ug | INTRAMUSCULAR | Status: DC | PRN

## 2022-02-09 MED ORDER — SODIUM CHLORIDE 0.9 % IV SOLN: INTRAVENOUS | Status: DC

## 2022-02-09 MED ORDER — SODIUM CHLORIDE (PF) 0.9 % IJ SOLN
INTRAMUSCULAR | Status: AC
  Filled 2022-02-09: qty 10

## 2022-02-09 MED ORDER — OXYCODONE HCL 5 MG PO TABS: 5.0000 mg | ORAL_TABLET | Freq: Once | ORAL | Status: AC | PRN

## 2022-02-09 MED ORDER — CHLORHEXIDINE GLUCONATE 0.12 % MT SOLN: 15.0000 mL | Freq: Once | OROMUCOSAL | Status: AC

## 2022-02-09 MED ORDER — MIDAZOLAM HCL 2 MG/2ML IJ SOLN
INTRAMUSCULAR | Status: AC
  Filled 2022-02-09: qty 2

## 2022-02-09 MED ORDER — HEPARIN 6000 UNIT IRRIGATION SOLUTION
Status: DC | PRN
  Administered 2022-02-09: 1

## 2022-02-09 MED ORDER — LIDOCAINE 2% (20 MG/ML) 5 ML SYRINGE
INTRAMUSCULAR | Status: AC
  Filled 2022-02-09: qty 5

## 2022-02-09 MED ORDER — FENTANYL CITRATE (PF) 100 MCG/2ML IJ SOLN
INTRAMUSCULAR | Status: DC | PRN
  Administered 2022-02-09: 25 ug via INTRAVENOUS

## 2022-02-09 MED ORDER — ACETAMINOPHEN 10 MG/ML IV SOLN
1000.0000 mg | Freq: Once | INTRAVENOUS | Status: DC | PRN
  Administered 2022-02-09: 1000 mg via INTRAVENOUS

## 2022-02-09 MED ORDER — GLYCOPYRROLATE PF 0.2 MG/ML IJ SOSY
PREFILLED_SYRINGE | INTRAMUSCULAR | Status: AC
  Filled 2022-02-09: qty 1

## 2022-02-09 MED ORDER — ACETAMINOPHEN 10 MG/ML IV SOLN
INTRAVENOUS | Status: AC
  Filled 2022-02-09: qty 100

## 2022-02-09 MED ORDER — ORAL CARE MOUTH RINSE: 15.0000 mL | Freq: Once | OROMUCOSAL | Status: AC

## 2022-02-09 SURGICAL SUPPLY — 56 items
ADH SKN CLS APL DERMABOND .7 (GAUZE/BANDAGES/DRESSINGS) ×1
ARMBAND PINK RESTRICT EXTREMIT (MISCELLANEOUS) ×4 IMPLANT
BAG COUNTER SPONGE SURGICOUNT (BAG) ×2 IMPLANT
BAG SPNG CNTER NS LX DISP (BAG) ×1
BALLN MUSTANG 7X60X75 (BALLOONS) ×1
BALLOON MUSTANG 7X60X75 (BALLOONS) IMPLANT
BNDG GAUZE DERMACEA FLUFF 4 (GAUZE/BANDAGES/DRESSINGS) IMPLANT
BNDG GZE DERMACEA 4 6PLY (GAUZE/BANDAGES/DRESSINGS) ×1
CANISTER SUCT 3000ML PPV (MISCELLANEOUS) ×2 IMPLANT
CLIP VESOCCLUDE MED 6/CT (CLIP) ×2 IMPLANT
CLIP VESOCCLUDE SM WIDE 6/CT (CLIP) ×2 IMPLANT
CNTNR URN SCR LID CUP LEK RST (MISCELLANEOUS) IMPLANT
CONT SPEC 4OZ STRL OR WHT (MISCELLANEOUS) ×1
DERMABOND ADVANCED .7 DNX12 (GAUZE/BANDAGES/DRESSINGS) ×2 IMPLANT
DRAPE HALF SHEET 40X57 (DRAPES) IMPLANT
DRSG TEGADERM 2-3/8X2-3/4 SM (GAUZE/BANDAGES/DRESSINGS) IMPLANT
ELECT REM PT RETURN 9FT ADLT (ELECTROSURGICAL) ×1
ELECTRODE REM PT RTRN 9FT ADLT (ELECTROSURGICAL) ×2 IMPLANT
GAUZE SPONGE 2X2 8PLY STRL LF (GAUZE/BANDAGES/DRESSINGS) IMPLANT
GLOVE BIOGEL PI IND STRL 7.0 (GLOVE) IMPLANT
GLOVE SURG SS PI 7.5 STRL IVOR (GLOVE) ×6 IMPLANT
GOWN STRL REUS W/ TWL LRG LVL3 (GOWN DISPOSABLE) ×4 IMPLANT
GOWN STRL REUS W/ TWL XL LVL3 (GOWN DISPOSABLE) ×2 IMPLANT
GOWN STRL REUS W/TWL LRG LVL3 (GOWN DISPOSABLE) ×2
GOWN STRL REUS W/TWL XL LVL3 (GOWN DISPOSABLE) ×1
HEMOSTAT SNOW SURGICEL 2X4 (HEMOSTASIS) IMPLANT
KIT BASIN OR (CUSTOM PROCEDURE TRAY) ×2 IMPLANT
KIT ENCORE 26 ADVANTAGE (KITS) IMPLANT
KIT TURNOVER KIT B (KITS) ×2 IMPLANT
LOOP VASCULAR MINI 18 RED (MISCELLANEOUS) ×1
NDL 18GX1X1/2 (RX/OR ONLY) (NEEDLE) ×2 IMPLANT
NEEDLE 18GX1X1/2 (RX/OR ONLY) (NEEDLE) ×1 IMPLANT
NS IRRIG 1000ML POUR BTL (IV SOLUTION) ×2 IMPLANT
PACK CV ACCESS (CUSTOM PROCEDURE TRAY) ×2 IMPLANT
PAD ARMBOARD 7.5X6 YLW CONV (MISCELLANEOUS) ×4 IMPLANT
SET MICROPUNCTURE 5F STIFF (MISCELLANEOUS) IMPLANT
SHEATH PINNACLE R/O II 7F 4CM (SHEATH) IMPLANT
SLING ARM FOAM STRAP LRG (SOFTGOODS) IMPLANT
SLING ARM FOAM STRAP MED (SOFTGOODS) IMPLANT
STENT VIABAHN 8X50X120 (Permanent Stent) IMPLANT
STENT VIABAHN5X120X8X (Permanent Stent) ×1 IMPLANT
SUT ETHILON 3 0 PS 1 (SUTURE) IMPLANT
SUT MNCRL AB 4-0 PS2 18 (SUTURE) IMPLANT
SUT PROLENE 5 0 C 1 24 (SUTURE) IMPLANT
SUT PROLENE 6 0 BV (SUTURE) ×4 IMPLANT
SUT VIC AB 3-0 SH 27 (SUTURE) ×2
SUT VIC AB 3-0 SH 27X BRD (SUTURE) ×4 IMPLANT
SUT VICRYL 4-0 PS2 18IN ABS (SUTURE) IMPLANT
SYR 30ML LL (SYRINGE) ×2 IMPLANT
SYR TOOMEY 50ML (SYRINGE) IMPLANT
TOWEL GREEN STERILE (TOWEL DISPOSABLE) ×2 IMPLANT
UNDERPAD 30X36 HEAVY ABSORB (UNDERPADS AND DIAPERS) ×2 IMPLANT
VASCULAR TIE MINI RED 18IN STL (MISCELLANEOUS) IMPLANT
WATER STERILE IRR 1000ML POUR (IV SOLUTION) ×2 IMPLANT
WIRE BENTSON .035X145CM (WIRE) IMPLANT
WIRE G V18X300 ST (WIRE) IMPLANT

## 2022-02-09 NOTE — Op Note (Signed)
    Patient name: [name] MRN: [id number] DOB: [birth year] Sex: female  02/09/2022 Pre-operative Diagnosis: Bleeding from degenerated left upper arm HeRO dialysis graft Post-operative diagnosis:  Same Surgeon:  Annamarie Major Assistants:  Moses Manners, PA Procedure:   #1: Ultrasound-guided access, left upper arm dialysis graft   #2: Left upper extremity shuntogram   #3: Venoplasty of left upper extremity dialysis graft   #4: Stent, left upper extremity dialysis graft (8 x 5 Viabahn)   #5: resection of necrotic skin ulceration Anesthesia:  General Blood Loss:  minimal Specimens:  none  Findings: The graft portion of her HeRO is nearly completely lined with stents which I felt made surgical revision challenging and therefore I elected to cover the ulcerated area with a Viabahn.  I then performed venoplasty of the accessible portions of the remaining graft with a 7 mm balloon.  No residual stenosis remains.  I then resected the ulcerated skin and closed this primarily.  There is no evidence of infection  Indications: This is a 46 year old female with end-stage renal disease who has been dialyzing through a left sided HeRO graft for many years.  She has undergone multiple revisions and has had stenting of nearly the entire graft portion.  She came in with significant bleeding and skin ulceration.  She comes in today for repair.  Procedure:  The patient was identified in the holding area and taken to [place] [facility]  The patient was then placed supine on the table. general anesthesia was administered.  The patient was prepped and draped in the usual sterile fashion.  A time out was called and antibiotics were administered.  Ultrasound was used to evaluate the dialysis graft.  It was cannulated in a nondistended segment with a micropuncture needle.  A [place] wire was then inserted followed by placement of micropuncture sheath.  Images were then obtained.  There is no obvious pseudoaneurysm.  The  graft was patent throughout.  I then inserted a 7 French sheath and placed a covered stent across the area of bleeding.  This was an 8 x 50 VIABAHN stent.  I then treated the entire length of the accessible portions of the graft with a 7 mm balloon.  The sheath was then removed and the cannulation site closed with a 4-0 Monocryl.  Attention was then turned towards the necrotic skin area.  I made an elliptical incision around this area.  I resected the necrotic skin and underlying subcutaneous tissue.  There was no graft that was visible.  I therefore elected to irrigate this area and closed it with interrupted 3-0 nylon vertical mattress sutures followed by sterile dressings.  There were no immediate complications.  The patient was successfully extubated taken recovery in stable condition.  An assistant was necessary for this procedure to help with wire exchanges, stent deployment, and wound closure.   Disposition: To PACU stable   V. Annamarie Major, M.D., [facility] [place] Office: [phone] Pager:  [phone]

## 2022-02-09 NOTE — Anesthesia Preprocedure Evaluation (Addendum)
Anesthesia Evaluation  Patient identified by MRN, date of birth, ID band Patient awake    Reviewed: Allergy & Precautions, NPO status , Patient's Chart, lab work & pertinent test results  Airway Mallampati: II  TM Distance: >3 FB Neck ROM: Full    Dental  (+) Missing   Pulmonary Current Smoker and Patient abstained from smoking.   Pulmonary exam normal        Cardiovascular hypertension, + DVT  Normal cardiovascular exam     Neuro/Psych  Headaches, Seizures -,  PSYCHIATRIC DISORDERS Anxiety Depression       GI/Hepatic Neg liver ROS,,,  Endo/Other  Lupus  Renal/GU ESRF and DialysisRenal disease     Musculoskeletal  (+) Arthritis ,    Abdominal   Peds  Hematology  (+) Blood dyscrasia (Plavix), anemia   Anesthesia Other Findings BLEEDING GRAFT  Reproductive/Obstetrics Hcg negative                             Anesthesia Physical Anesthesia Plan  ASA: 3  Anesthesia Plan: General   Post-op Pain Management:    Induction: Intravenous  PONV Risk Score and Plan: 2 and Ondansetron, Dexamethasone and Treatment may vary due to age or medical condition  Airway Management Planned: LMA  Additional Equipment:   Intra-op Plan:   Post-operative Plan: Extubation in OR  Informed Consent: I have reviewed the patients History and Physical, chart, labs and discussed the procedure including the risks, benefits and alternatives for the proposed anesthesia with the patient or authorized representative who has indicated his/her understanding and acceptance.     Dental advisory given  Plan Discussed with: CRNA  Anesthesia Plan Comments:        Anesthesia Quick Evaluation

## 2022-02-09 NOTE — Interval H&P Note (Signed)
History and Physical Interval Note:  02/09/2022 7:42 AM  Cloyd Stagers  has presented today for surgery, with the diagnosis of BLEEDING GRAFT.  The various methods of treatment have been discussed with the patient and family. After consideration of risks, benefits and other options for treatment, the patient has consented to  Procedure(s): REVISION OF DIALYSIS GRAFT ARM AND FISTULAGRAM (Left) as a surgical intervention.  The patient's history has been reviewed, patient examined, no change in status, stable for surgery.  I have reviewed the patient's chart and labs.  Questions were answered to the patient's satisfaction.     Annamarie Major  Emergency consent

## 2022-02-09 NOTE — Anesthesia Postprocedure Evaluation (Signed)
Anesthesia Post Note  Patient: Jeraldean Wechter  Procedure(s) Performed: FISTULAGRAM, placement of left arm stent. (Left: Arm Upper) EXCISION of left arm ulcer (Left: Arm Upper)     Patient location during evaluation: PACU Anesthesia Type: General Level of consciousness: awake Pain management: pain level controlled Vital Signs Assessment: post-procedure vital signs reviewed and stable Respiratory status: spontaneous breathing, nonlabored ventilation and respiratory function stable Cardiovascular status: blood pressure returned to baseline and stable Postop Assessment: no apparent nausea or vomiting Anesthetic complications: no   No notable events documented.  Last Vitals:  Vitals:   02/09/22 1641 02/09/22 1700  BP: (!) 94/56 93/63  Pulse: 77 76  Resp: 13 (!) 21  Temp: 36.7 C   SpO2:  100%    Last Pain:  Vitals:   02/09/22 1641  TempSrc: Oral  PainSc:                  Jazma Pickel P Delores Edelstein

## 2022-02-09 NOTE — Anesthesia Procedure Notes (Signed)
Procedure Name: LMA Insertion Date/Time: 02/09/2022 8:01 AM  Performed by: Inda Coke, CRNAPre-anesthesia Checklist: Patient identified, Emergency Drugs available, Suction available, Timeout performed and Patient being monitored Patient Re-evaluated:Patient Re-evaluated prior to induction Oxygen Delivery Method: Circle system utilized Preoxygenation: Pre-oxygenation with 100% oxygen Induction Type: IV induction Ventilation: Mask ventilation without difficulty LMA: LMA inserted LMA Size: 4.0 Tube type: Oral Placement Confirmation: positive ETCO2, CO2 detector and breath sounds checked- equal and bilateral Tube secured with: Tape Dental Injury: Teeth and Oropharynx as per pre-operative assessment

## 2022-02-09 NOTE — ED Notes (Signed)
This paramedic called this patients legal guardian to obtain consent for surgery. Patient legal guardian listed was a no answer. This paramedic attempted multiple times to reach someone and called multiple numbers in the chart. No response from any number called.

## 2022-02-09 NOTE — Social Work (Signed)
Patient can leave first thing in the AM. CSW spoke to [place] at [facility] is aware that Arbie Cookey the AM CSW will reach out to her First thing in the AM so patient can return.

## 2022-02-09 NOTE — Progress Notes (Signed)
Pt arrived from ..PACU..., A/ox .4..pt denies any pain, MD aware,CCMD called. CHG bath given,no further needs at this time   

## 2022-02-09 NOTE — ED Notes (Addendum)
Patient resting comfortably at this time.

## 2022-02-09 NOTE — ED Notes (Signed)
Patient resting in bed. Chest rise and fall seen. No signs of distress noted.

## 2022-02-09 NOTE — Transfer of Care (Signed)
Immediate Anesthesia Transfer of Care Note  Patient: [name]  Procedure(s) Performed: FISTULAGRAM, placement of left arm stent. (Left: Arm Upper) EXCISION of left arm ulcer (Left: Arm Upper)  Patient Location: PACU  Anesthesia Type:General  Level of Consciousness: awake and alert   Airway & Oxygen Therapy: Patient Spontanous Breathing  Post-op Assessment: Report given to RN, Post -op Vital signs reviewed and stable, and Patient moving all extremities X 4  Post vital signs: Reviewed and stable  Last Vitals:  Vitals Value Taken Time  BP 93/61 02/09/22 0930  Temp    Pulse 99 02/09/22 0931  Resp 12 02/09/22 0931  SpO2 96 % 02/09/22 0931  Vitals shown include unvalidated device data.  Last Pain:  Vitals:   02/09/22 0718  TempSrc: Oral  PainSc:          Complications: No notable events documented.

## 2022-02-09 NOTE — Progress Notes (Signed)
Brief nephrology note  Patient is a MWF dialysis patient at [facility] (I think, history was limited by the patient's intellectual disability).  Presented yesterday because of bleeding from her AV graft.  Sounds like she last got dialysis on Friday.  She was seen by vascular surgery and is planning to undergo intervention today.  Discussed with hospitalist and patient unlikely to leave the hospital soon if surgery goes well.  Will plan for dialysis tomorrow and can consult formally if the patient remains in the hospital for a longer period of time.

## 2022-02-09 NOTE — Progress Notes (Signed)
PROGRESS NOTE  [name] ZRA:[id number] DOB: 01-02-1976 DOA: 02/08/2022 PCP: Carrolyn Meiers, MD   LOS: 1 day   Brief Narrative / Interim history: 46 year old female with history of ESRD on MWF HD, lupus, chronic hypotension, HLD, depression / anxiety, intellectual disability living in an ALF comes into the hospital with bleeding from her LUE AV graft.  She was found unresponsive with a lot of blood on her.  She was brought to the hospital and vascular surgery was consulted and placed a pressure dressing in the ED and will be taken to the OR this morning.  Subjective / 24h Interval events: Doing well this morning, awaiting OR  Assesement and Plan: Principal Problem:   Hemorrhage from arteriovenous dialysis graft (HCC) Active Problems:   ESRD on hemodialysis ([place])   Chronic hypotension   Depression with anxiety   Lupus (HCC)   Anemia in chronic kidney disease   Seizure disorder ([place])   Principal problem LUE dialysis graft bleeding-vascular surgery consulted, discussed with Dr. Trula Slade, she will go to the OR this morning.  Active problems Acute blood loss anemia-due to #1, received a unit of packed red blood cells.  Hemoglobin stable, no longer bleeding.  Closely monitor postop, repeat CBC in the morning  Leukocytosis-likely reactive, white count normalized this morning  ESRD on HD-nephrology to dialyze tomorrow  History of lupus-noted, continue home medications once pharmacy reconciles home regimen  Depression/anxiety-continue home medications once pharmacy reconciles home regimen  Chronic hypotension-continue home midodrine  Disposition-will likely go back to her ALF tomorrow if hemoglobin and postop course is stable  Scheduled Meds:  Chlorhexidine Gluconate Cloth  6 each Topical Q0600   [MAR Hold] midodrine  10 mg Oral BID   [MAR Hold] sodium chloride flush  3 mL Intravenous Q12H   Continuous Infusions:  [MAR Hold] sodium chloride     sodium chloride  10 mL/hr at 02/09/22 0755   acetaminophen     PRN Meds:.acetaminophen, [MAR Hold] acetaminophen **OR** [MAR Hold] acetaminophen, fentaNYL (SUBLIMAZE) injection, [MAR Hold] ondansetron **OR** [MAR Hold] ondansetron (ZOFRAN) IV, oxyCODONE **OR** oxyCODONE, promethazine, [MAR Hold] senna-docusate  Current Outpatient Medications  Medication Instructions   albuterol (PROVENTIL) 2.5 mg, Nebulization, 4 times daily PRN   ALPRAZolam (XANAX) 0.25 mg, Oral, 2 times daily   ANTACID CALCIUM 500 MG chewable tablet Oral   ANTACID CALCIUM 500 MG chewable tablet 1 tablet, Oral, Daily   aspirin EC 81 mg, Oral, Daily   chlorhexidine (PERIDEX) 0.12 % solution 5 mLs, Mouth/Throat, 2 times daily PRN   clopidogrel (PLAVIX) 75 mg, Oral, Daily   docusate sodium (COLACE) 100 mg, Oral, Daily   epoetin alfa (EPOGEN) 4,000 Units, Intravenous, See admin instructions, Mon, wed, and Friday at dialysis    guaiFENesin (ROBITUSSIN) 200 mg, Oral, Every 6 hours PRN   HYDROcodone-acetaminophen (NORCO) 5-325 MG tablet 1 tablet, Oral, Every 6 hours PRN   hydroxychloroquine (PLAQUENIL) 200 mg, Oral, Daily   ipratropium (ATROVENT) 250 mcg, Nebulization, 4 times daily PRN   lidocaine-prilocaine (EMLA) cream 1 application , Topical, As needed   loratadine (CLARITIN) 10 mg, Oral, Daily PRN   midodrine (PROAMATINE) 10 mg, Oral, 2 times daily   mirtazapine (REMERON) 7.5 mg, Oral, Daily at bedtime   multivitamin (RENA-VIT) TABS tablet 1 tablet, Oral, Daily   pantoprazole (PROTONIX) 40 mg, Oral, Daily   phenytoin (DILANTIN) 100 mg, Oral, 2 times daily   RESTASIS 0.05 % ophthalmic emulsion No dose, route, or frequency recorded.   sertraline (ZOLOFT) 200 mg, Oral, Daily  sevelamer carbonate (RENVELA) 800 mg, Oral, See admin instructions, Take 800 mg by mouth three times daily with meals and 800 mg by mouth twice daily with snacks   simvastatin (ZOCOR) 40 mg, Oral, Daily at bedtime    Diet Orders (From admission, onward)     Start      Ordered   02/08/22 2038  Diet renal with fluid restriction Fluid restriction: 1200 mL Fluid; Room service appropriate? Yes; Fluid consistency: Thin  Diet effective now       Question Answer Comment  Fluid restriction: 1200 mL Fluid   Room service appropriate? Yes   Fluid consistency: Thin      02/08/22 2039            DVT prophylaxis: SCDs Start: 02/08/22 2038   Lab Results  Component Value Date   PLT 194 02/09/2022      Code Status: Full Code  Family Communication: no family at bedside  Status is: Inpatient  Remains inpatient appropriate because: OR today   Level of care: Progressive  Consultants:  Nephrology Vascular surgery   Objective: Vitals:   02/09/22 0545 02/09/22 0600 02/09/22 0718 02/09/22 0930  BP: '96/61 95/60 90/60 '$ 93/61  Pulse:  64 71 (!) 103  Resp: 10 16 (!) 22 15  Temp:   98 F (36.7 C) 97.7 F (36.5 C)  TempSrc:   Oral   SpO2:  100% 98% 96%  Weight:   53 kg   Height:   '5\' 3"'$  (1.6 m)     Intake/Output Summary (Last 24 hours) at 02/09/2022 1003 Last data filed at 02/09/2022 0911 Gross per 24 hour  Intake 650 ml  Output 25 ml  Net 625 ml   Wt Readings from Last 3 Encounters:  02/09/22 53 kg  02/05/22 53.7 kg  01/23/22 50.8 kg    Examination:  Constitutional: NAD Eyes: no scleral icterus ENMT: Mucous membranes are moist.  Neck: normal, supple Respiratory: clear to auscultation bilaterally, no wheezing, no crackles. Normal respiratory effort. No accessory muscle use.  Cardiovascular: Regular rate and rhythm, no murmurs / rubs / gallops. No LE edema.  Abdomen: non distended, no tenderness. Bowel sounds positive.  Musculoskeletal: no clubbing / cyanosis.    Data Reviewed: I have independently reviewed following labs and imaging studies   CBC Recent Labs  Lab 02/08/22 1654 02/08/22 2045 02/09/22 0611  WBC 6.0 20.1* 10.0  HGB 10.1* 11.8* 10.4*  HCT 29.8* 34.2* 29.3*  PLT 221 211 194  MCV 87.1 83.4 82.1  MCH 29.5 28.8  29.1  MCHC 33.9 34.5 35.5  RDW 15.7* 15.5 15.8*  LYMPHSABS 3.0  --   --   MONOABS 1.1*  --   --   EOSABS 0.2  --   --   BASOSABS 0.1  --   --     Recent Labs  Lab 02/08/22 1654 02/09/22 0611  NA 137 138  K 3.7 4.7  CL 97* 96*  CO2 24 27  GLUCOSE 255* 85  BUN 41* 44*  CREATININE 7.71* 8.96*  CALCIUM 8.0* 8.9    ------------------------------------------------------------------------------------------------------------------ No results for input(s): "CHOL", "HDL", "LDLCALC", "TRIG", "CHOLHDL", "LDLDIRECT" in the last 72 hours.  No results found for: "HGBA1C" ------------------------------------------------------------------------------------------------------------------ No results for input(s): "TSH", "T4TOTAL", "T3FREE", "THYROIDAB" in the last 72 hours.  Invalid input(s): "FREET3"  Cardiac Enzymes No results for input(s): "CKMB", "TROPONINI", "MYOGLOBIN" in the last 168 hours.  Invalid input(s): "CK" ------------------------------------------------------------------------------------------------------------------ No results found for: "BNP"  CBG: No results for input(s): "GLUCAP"  in the last 168 hours.  No results found for this or any previous visit (from the past 240 hour(s)).   Radiology Studies: HYBRID OR IMAGING ([place])  Result Date: 02/09/2022 There is no interpretation for this exam.  This order is for images obtained during a surgical procedure.  Please See "Surgeries" Tab for more information regarding the procedure.     Marzetta Board, MD, PhD [facility]  Between 7 am - 7 pm I am available, please contact me via Amion (for emergencies) or Securechat (non urgent messages)  Between 7 pm - 7 am I am not available, please contact night coverage MD/APP via Amion

## 2022-02-10 ENCOUNTER — Encounter (HOSPITAL_COMMUNITY): Payer: Self-pay | Admitting: Surgery

## 2022-02-10 DIAGNOSIS — T82838A Hemorrhage of vascular prosthetic devices, implants and grafts, initial encounter: Secondary | ICD-10-CM | POA: Diagnosis not present

## 2022-02-10 LAB — CBC
HCT: 21.6 % — ABNORMAL LOW (ref 36.0–46.0)
Hemoglobin: 7.9 g/dL — ABNORMAL LOW (ref 12.0–15.0)
MCH: 29.6 pg (ref 26.0–34.0)
MCHC: 36.6 g/dL — ABNORMAL HIGH (ref 30.0–36.0)
MCV: 80.9 fL (ref 80.0–100.0)
Platelets: 167 10*3/uL (ref 150–400)
RBC: 2.67 MIL/uL — ABNORMAL LOW (ref 3.87–5.11)
RDW: 15.3 % (ref 11.5–15.5)
WBC: 9.7 10*3/uL (ref 4.0–10.5)
nRBC: 0 % (ref 0.0–0.2)

## 2022-02-10 LAB — BASIC METABOLIC PANEL
Anion gap: 19 — ABNORMAL HIGH (ref 5–15)
BUN: 54 mg/dL — ABNORMAL HIGH (ref 6–20)
CO2: 22 mmol/L (ref 22–32)
Calcium: 9 mg/dL (ref 8.9–10.3)
Chloride: 95 mmol/L — ABNORMAL LOW (ref 98–111)
Creatinine, Ser: 10.16 mg/dL — ABNORMAL HIGH (ref 0.44–1.00)
GFR, Estimated: 4 mL/min — ABNORMAL LOW (ref >60)
Glucose, Bld: 82 mg/dL (ref 70–99)
Potassium: 4.6 mmol/L (ref 3.5–5.1)
Sodium: 136 mmol/L (ref 135–145)

## 2022-02-10 LAB — PREPARE RBC (CROSSMATCH)

## 2022-02-10 MED ORDER — SODIUM CHLORIDE 0.9% IV SOLUTION: Freq: Once | INTRAVENOUS | Status: DC

## 2022-02-10 NOTE — Progress Notes (Signed)
   02/10/22 1156  Vitals  Temp 98 F (36.7 C)  Pulse Rate 72  Resp 16  BP (!) 97/59  SpO2 100 %  O2 Device Room Air  Oxygen Therapy  Patient Activity (if Appropriate) In bed  Pulse Oximetry Type Continuous  Post Treatment  Dialyzer Clearance Lightly streaked  Duration of HD Treatment -hour(s) 3.5 hour(s)  Liters Processed 84  Fluid Removed (mL) 1000 mL  Tolerated HD Treatment Yes  Post-Hemodialysis Comments Tolerated Dialysis well  AVG/AVF Arterial Site Held (minutes) 10 minutes  AVG/AVF Venous Site Held (minutes) 10 minutes   Received patient in bed to unit.  Alert and oriented.  Informed consent signed and in chart.     Patient tolerated well.  Transported back to the room  Alert, without acute distress.  Hand-off given to patient's nurse.   Access used: LUA AVG Access issues: none  Total UF removed: 1L Medication(s) given: midodrine, 1 U PRBCs    [facility]

## 2022-02-10 NOTE — Progress Notes (Signed)
  Postoperative hemodialysis access     Date of Surgery:  02/09/2022 Surgeon: Trula Slade  Subjective:  no complaints  PHYSICAL EXAMINATION:  Vitals:   02/10/22 0449 02/10/22 0450  BP: 90/60 90/60  Pulse: 77 75  Resp: 12 16  Temp: (!) 97.5 F (36.4 C)   SpO2: 98% 99%    Incision is clean and dry with nylon sutures in tact Sensation in digits is intact;  There is  Thrill  The is fistula is palpable     ASSESSMENT/PLAN:  [name] is a 46 y.o. year old female who is s/p LUE shuntogram with venoplasty of LUE dialysis graft, stent to LUE dialysis graft and resection of necrotic skin ulceration.  - fistula is patent with palpable thrill.  Do not stick over incision and stick graft medially. -pt does not have evidence of steal sx -f/u with [facility] in 2-3 weeks to have sutures removed. -will sign off-call as needed.   Leontine Locket, PA-C [facility] [phone]

## 2022-02-10 NOTE — Procedures (Signed)
Seen and examined on dialysis.  She is now inpatient per charting.  Blood pressure 92/55 and HR 72.  Tolerating goal.  Chronically low BP per RN who knew pt from outpatient.  Her graft is working well for HD today.    She is to get a unit of blood and possibly for discharge later today per RN report.  S/p revision of her graft and resection of necrotic skin ulceration on 11/26 with vascular  Spoke with HD SW to update her.   Claudia Desanctis, MD 02/10/2022  9:49 AM

## 2022-02-10 NOTE — Progress Notes (Signed)
Patient off floor for procedure 

## 2022-02-10 NOTE — TOC Transition Note (Signed)
Transition of Care [facility]) - CM/SW Discharge Note   Patient Details  Name: Keaira Whitehurst MRN: [id number] Date of Birth: 07-09-1975  Transition of Care [facility]) CM/SW Contact:  Vinie Sill, LCSW Phone Number: 02/10/2022, 1:13 PM   Clinical Narrative:     Patient will Discharge to: [facility] Discharge Date: 02/10/2022 Family Notified: Minerva Fester and [facility]) Transport By: Corey Harold  Per MD patient is ready for discharge. RN, patient, and facility notified of discharge. Discharge Summary sent to facility. RN given number for report [phone], [facility]. Ambulance transport requested for patient.   Clinical Social Worker signing off.  Thurmond Butts, MSW, LCSW Clinical Social Worker     Final next level of care: Assisted Living Barriers to Discharge: Barriers Resolved   Patient Goals and CMS Choice        Discharge Placement                Patient to be transferred to facility by: [facility] Name of family member notified: Sherlyn Hay Patient and family notified of of transfer: 02/10/22  Discharge Plan and Services In-house Referral: Clinical Social Work                                   Social Determinants of Health (SDOH) Interventions     Readmission Risk Interventions     No data to display

## 2022-02-10 NOTE — Progress Notes (Signed)
Attempted report to [facility] x1, left message.

## 2022-02-10 NOTE — TOC Initial Note (Signed)
Transition of Care [facility]) - Initial/Assessment Note    Patient Details  Name: [name] MRN: [id number] Date of Birth: 1975-08-26  Transition of Care [facility]) CM/SW Contact:    Vinie Sill, LCSW Phone Number: 02/10/2022, 11:04 AM  Clinical Narrative:                  CSW spoke with [place] w/ [facility]- she confirmed the patient can return today but will need PTAR.   CSW informed Almyra Free, her Legal Guardian of anticipated d/c today.   TOC will continue to follow and assist with discharge planning.   Thurmond Butts, MSW, LCSW Clinical Social Worker     Expected Discharge Plan: Assisted Living ([place]) Barriers to Discharge: Barriers Resolved   Patient Goals and CMS Choice        Expected Discharge Plan and Services Expected Discharge Plan: Assisted Living ([place]) In-house Referral: Clinical Social Work                                            Prior Living Arrangements/Services   Lives with:: Self, Facility Resident Patient language and need for interpreter reviewed:: No        Need for Family Participation in Patient Care: Yes (Comment) Care giver support system in place?: Yes (comment)   Criminal Activity/Legal Involvement Pertinent to Current Situation/Hospitalization: No - Comment as needed  Activities of Daily Living Home Assistive Devices/Equipment: Eyeglasses ADL Screening (condition at time of admission) Patient's cognitive ability adequate to safely complete daily activities?: No Is the patient deaf or have difficulty hearing?: No Does the patient have difficulty seeing, even when wearing glasses/contacts?: No Does the patient have difficulty concentrating, remembering, or making decisions?: Yes Patient able to express need for assistance with ADLs?: Yes Does the patient have difficulty dressing or bathing?: No Independently performs ADLs?: No Communication:  Independent Dressing (OT): Needs assistance Is this a change from baseline?: Pre-admission baseline Grooming: Needs assistance Is this a change from baseline?: Pre-admission baseline Feeding: Independent Bathing: Needs assistance Is this a change from baseline?: Pre-admission baseline Toileting: Needs assistance Is this a change from baseline?: Pre-admission baseline In/Out Bed: Needs assistance Is this a change from baseline?: Pre-admission baseline Walks in Home: Needs assistance Is this a change from baseline?: Pre-admission baseline Does the patient have difficulty walking or climbing stairs?: Yes Weakness of Legs: None Weakness of Arms/Hands: Left  Permission Sought/Granted Permission sought to share information with : Facility Sport and exercise psychologist, Case Manager (Legal Guardian Almyra Free)       Permission granted to share info w AGENCY: [facility]        Emotional Assessment         Alcohol / Substance Use: Not Applicable Psych Involvement: No (comment)  Admission diagnosis:  Hemorrhage [R58] Severe anemia [D64.9] Problem with vascular access [Z78.9] Hemorrhage from arteriovenous dialysis graft [facility]) [F79.024O] Patient Active Problem List   Diagnosis Date Noted   Hemorrhage from arteriovenous dialysis graft ([place]) 02/08/2022   Chronic hypotension 02/08/2022   Diabetes ([place]) 11/02/2018   Lipoma of extremity    Hyperkalemia 97/35/3299   Complication of vascular access for dialysis 04/01/2016   Seizure disorder ([place]) 10/01/2015   Generalized muscle weakness 10/01/2015   Vaginal discharge 11/08/2013   Vaginal odor 11/08/2013   BV (bacterial vaginosis) 11/08/2013   ESRD on hemodialysis (  [place]) 12/27/2010   Anemia in chronic kidney disease 12/27/2010   Major depressive disorder with single episode 12/27/2010   Secondary hyperparathyroidism of renal origin ([place]) 12/27/2010   Systemic lupus erythematosus, organ or system involvement unspecified ([place]) 12/27/2010    Type 2 diabetes mellitus without complication ([place]) 03/25/3233   SWELLING, NECK 04/02/2007   ASTHMA 03/15/2007   FOOT PAIN, RIGHT 03/15/2007   COUGH 03/15/2007   MENTAL RETARDATION, MODERATE 01/19/2007   STATUS, MENTAL, ALTERED 07/16/2006   ANOREXIA 05/26/2006   Disease of pericardium 05/15/2006   Tachycardia 05/15/2006   Lupus ([place]) 04/01/2006   ANEMIA-NOS 03/31/2006   LEUKOCYTOSIS 03/31/2006   Depression with anxiety 03/31/2006   MIGRAINE HEADACHE 03/31/2006   PERIPHERAL NEUROPATHY 03/31/2006   Essential (primary) hypertension 03/31/2006   GERD 03/31/2006   OSTEOARTHRITIS 03/31/2006   HYPERGLYCEMIA 03/31/2006   PCP:  Carrolyn Meiers, MD Pharmacy:   [facility], [place] [address] [place] [address] Phone: [phone] Fax: [phone]     Social Determinants of Health (SDOH) Interventions    Readmission Risk Interventions     No data to display

## 2022-02-10 NOTE — Plan of Care (Signed)
  Problem: Education: Goal: Knowledge of General Education information will improve Description: Including pain rating scale, medication(s)/side effects and non-pharmacologic comfort measures Outcome: Adequate for Discharge   Problem: Health Behavior/Discharge Planning: Goal: Ability to manage health-related needs will improve Outcome: Adequate for Discharge   Problem: Clinical Measurements: Goal: Ability to maintain clinical measurements within normal limits will improve Outcome: Adequate for Discharge Goal: Will remain free from infection Outcome: Adequate for Discharge Goal: Diagnostic test results will improve Outcome: Adequate for Discharge Goal: Respiratory complications will improve Outcome: Adequate for Discharge Goal: Cardiovascular complication will be avoided Outcome: Adequate for Discharge   Problem: Nutrition: Goal: Adequate nutrition will be maintained Outcome: Adequate for Discharge   Problem: Coping: Goal: Level of anxiety will decrease Outcome: Adequate for Discharge   Problem: Elimination: Goal: Will not experience complications related to bowel motility Outcome: Adequate for Discharge Goal: Will not experience complications related to urinary retention Outcome: Adequate for Discharge   Problem: Skin Integrity: Goal: Risk for impaired skin integrity will decrease Outcome: Adequate for Discharge   Problem: Safety: Goal: Ability to remain free from injury will improve Outcome: Adequate for Discharge

## 2022-02-10 NOTE — Progress Notes (Addendum)
Pt receives out-pt HD at [facility] on MWF. Clinic advised pt will d/c later today and resume care on Wednesday. Will fax d/c summary,renal note, and vascular note to clinic once available for continuation of care.   Melven Sartorius Renal Navigator [phone]  Addendum at 1:36 pm: Clinicals faxed to clinic for continuation of care.

## 2022-02-10 NOTE — Discharge Summary (Signed)
Physician Discharge Summary  [name] MVH:[id number] DOB: 1975-09-30 DOA: 02/08/2022  PCP: Carrolyn Meiers, MD  Admit date: 02/08/2022 Discharge date: 02/10/2022  Admitted From: ALF Disposition:  ALF  Recommendations for Outpatient Follow-up:  Follow up with nephrology with next HD in 2 days  Home Health: none Equipment/Devices: none  Discharge Condition: stable CODE STATUS: Full code Diet Orders (From admission, onward)     Start     Ordered   02/08/22 2038  Diet renal with fluid restriction Fluid restriction: 1200 mL Fluid; Room service appropriate? Yes; Fluid consistency: Thin  Diet effective now       Question Answer Comment  Fluid restriction: 1200 mL Fluid   Room service appropriate? Yes   Fluid consistency: Thin      02/08/22 2039            HPI: Per admitting MD, [name] is a 46 y.o. female with medical history significant for ESRD on MWF HD, lupus, chronic hypotension, HLD, depression/anxiety, intellectual disability who presented to the ED from her living facility after she was found to be unresponsive with bleeding from her LUE AV graft.  History is somewhat limited from patient due to intellectual disability and is otherwise supplemented by EDP and chart review. Patient currently resides at an assisted living facility.  She had recent issues with bleeding from her LUE AV graft with plans for outpatient fistulogram in the next few weeks. She was found unresponsive at her facility earlier today covered in blood with bleeding from her graft.  EMS were called and she required IV fluid resuscitation.  Pressure dressing was placed on the fistula and she was taken Forestine Na, ED. According to medical records patient is chronically taking aspirin 81 mg and Plavix 75 mg daily, unclear specific indication but remote documentation suggest history of CVA. Patient denies any other obvious bleeding.  She tells me she does dialysis Monday Tuesday Wednesday  but last dialysis was yesterday (Friday 11/24).  Hospital Course / Discharge diagnoses: Principal Problem:   Hemorrhage from arteriovenous dialysis graft [facility]) Active Problems:   ESRD on hemodialysis ([place])   Chronic hypotension   Depression with anxiety   Lupus (HCC)   Anemia in chronic kidney disease   Seizure disorder ([place])   Principal problem LUE dialysis graft bleeding-vascular surgery consulted, discussed with Dr. Trula Slade, she was taken to the operating room on 11/26 status post venoplasty and stenting of the dialysis graft.  She recovered well postoperatively, has no further bleeding, she was able to undergo HD on the day of discharge without difficulties.  She will be discharged home in stable condition with outpatient follow-up with vascular surgery as well as nephrology   Active problems Acute blood loss anemia-due to #1, hemoglobin dipped as low as 7.9, she was transfused, has no further bleeding, recommend monitoring CBC with her dialysis as an outpatient  Leukocytosis-likely reactive, white count normalized  ESRD on HD-dialyzed Monday, 11/27  History of lupus-continue home med Depression/anxiety-continue home medications  Chronic hypotension-continue home midodrine  Sepsis ruled out   Discharge Instructions   Allergies as of 02/10/2022       Reactions   Dust Mite Extract Other (See Comments)   sneezing        Medication List     TAKE these medications    albuterol (2.5 MG/3ML) 0.083% nebulizer solution Commonly known as: PROVENTIL Take 2.5 mg by nebulization 4 (four) times daily as needed for wheezing or shortness of breath.   ALPRAZolam  0.25 MG tablet Commonly known as: XANAX Take 0.25 mg by mouth 2 (two) times daily.   Antacid 500 MG chewable tablet Generic drug: calcium carbonate Chew 2 tablets by mouth 2 (two) times daily.   aspirin EC 81 MG tablet Take 81 mg by mouth daily.   chlorhexidine 0.12 % solution Commonly known as: PERIDEX Use as  directed 5 mLs in the mouth or throat 2 (two) times daily as needed.   clopidogrel 75 MG tablet Commonly known as: PLAVIX Take 75 mg by mouth daily.   docusate sodium 100 MG capsule Commonly known as: COLACE Take 100 mg by mouth daily.   epoetin alfa 4000 UNIT/ML injection Commonly known as: EPOGEN Inject 4,000 Units into the vein See admin instructions. Mon, wed, and Friday at dialysis   guaiFENesin 100 MG/5ML liquid Commonly known as: ROBITUSSIN Take 200 mg by mouth every 6 (six) hours as needed for cough.   hydroxychloroquine 200 MG tablet Commonly known as: PLAQUENIL Take 200 mg by mouth daily.   ipratropium 0.02 % nebulizer solution Commonly known as: ATROVENT Take 0.5 mg by nebulization 4 (four) times daily as needed for wheezing or shortness of breath.   lidocaine-prilocaine cream Commonly known as: EMLA Apply 1 application topically as needed (port access).   loratadine 10 MG tablet Commonly known as: CLARITIN Take 10 mg by mouth daily as needed for allergies.   midodrine 10 MG tablet Commonly known as: PROAMATINE Take 10 mg by mouth 2 (two) times daily.   mirtazapine 15 MG tablet Commonly known as: REMERON Take 7.5 mg by mouth at bedtime.   multivitamin Tabs tablet Take 1 tablet by mouth daily.   pantoprazole 40 MG tablet Commonly known as: PROTONIX Take 40 mg by mouth daily.   phenytoin 100 MG ER capsule Commonly known as: DILANTIN Take 100 mg by mouth 2 (two) times daily.   Restasis 0.05 % ophthalmic emulsion Generic drug: cycloSPORINE Place 1 drop into both eyes 2 (two) times daily.   sertraline 100 MG tablet Commonly known as: ZOLOFT Take 200 mg by mouth daily.   simvastatin 40 MG tablet Commonly known as: ZOCOR Take 40 mg by mouth at bedtime.       Consultations: Nephrology  Vascular surgery   Procedures/Studies:  HYBRID OR IMAGING ([place])  Result Date: 02/09/2022 There is no interpretation for this exam.  This order is for  images obtained during a surgical procedure.  Please See "Surgeries" Tab for more information regarding the procedure.   DG Chest Port 1 View  Result Date: 01/23/2022 CLINICAL DATA:  Chest pain. EXAM: PORTABLE CHEST 1 VIEW COMPARISON:  Chest radiograph 06/17/2013 FINDINGS: A left-sided vascular graft is again seen terminating at the superior cavoatrial junction, stable in position. The graft appears intact. A separate vascular stent is noted in the left axillary region. The cardiomediastinal silhouette is normal. There is no focal consolidation or pulmonary edema. There is no pleural effusion or pneumothorax There is no acute osseous abnormality. IMPRESSION: Left-sided vascular graft is in stable position and appears intact. No radiographic evidence of acute cardiopulmonary process. Electronically Signed   By: Valetta Mole M.D.   On: 01/23/2022 17:05     Subjective: - no chest pain, shortness of breath, no abdominal pain, nausea or vomiting.   Discharge Exam: BP 90/61   Pulse 71   Temp 98.2 F (36.8 C) (Oral)   Resp 13   Ht '5\' 3"'$  (1.6 m)   Wt 55.7 kg   SpO2 100%  BMI 21.75 kg/m   General: Pt is alert, awake, not in acute distress Cardiovascular: RRR, S1/S2 +, no rubs, no gallops Respiratory: CTA bilaterally, no wheezing, no rhonchi Abdominal: Soft, NT, ND, bowel sounds + Extremities: no edema, no cyanosis    The results of significant diagnostics from this hospitalization (including imaging, microbiology, ancillary and laboratory) are listed below for reference.     Microbiology: No results found for this or any previous visit (from the past 240 hour(s)).   Labs: Basic Metabolic Panel: Recent Labs  Lab 02/08/22 1654 02/09/22 0611 02/10/22 0122  NA 137 138 136  K 3.7 4.7 4.6  CL 97* 96* 95*  CO2 '24 27 22  '$ GLUCOSE 255* 85 82  BUN 41* 44* 54*  CREATININE 7.71* 8.96* 10.16*  CALCIUM 8.0* 8.9 9.0   Liver Function Tests: No results for input(s): "AST", "ALT",  "ALKPHOS", "BILITOT", "PROT", "ALBUMIN" in the last 168 hours. CBC: Recent Labs  Lab 02/08/22 1654 02/08/22 2045 02/09/22 0611 02/10/22 0122  WBC 6.0 20.1* 10.0 9.7  NEUTROABS 1.6*  --   --   --   HGB 10.1* 11.8* 10.4* 7.9*  HCT 29.8* 34.2* 29.3* 21.6*  MCV 87.1 83.4 82.1 80.9  PLT 221 211 194 167   CBG: No results for input(s): "GLUCAP" in the last 168 hours. Hgb A1c No results for input(s): "HGBA1C" in the last 72 hours. Lipid Profile No results for input(s): "CHOL", "HDL", "LDLCALC", "TRIG", "CHOLHDL", "LDLDIRECT" in the last 72 hours. Thyroid function studies No results for input(s): "TSH", "T4TOTAL", "T3FREE", "THYROIDAB" in the last 72 hours.  Invalid input(s): "FREET3" Urinalysis    Component Value Date/Time   COLORURINE YELLOW 02/09/2008 1437   APPEARANCEUR TURBID (A) 02/09/2008 1437   LABSPEC 1.025 02/09/2008 1437   PHURINE 6.0 02/09/2008 1437   GLUCOSEU NEGATIVE 02/09/2008 1437   HGBUR LARGE (A) 02/09/2008 1437   BILIRUBINUR NEGATIVE 02/09/2008 1437   KETONESUR 15 (A) 02/09/2008 1437   PROTEINUR >300 (A) 02/09/2008 1437   UROBILINOGEN 0.2 02/09/2008 1437   NITRITE NEGATIVE 02/09/2008 1437   LEUKOCYTESUR LARGE (A) 02/09/2008 1437    FURTHER DISCHARGE INSTRUCTIONS:   Get Medicines reviewed and adjusted: Please take all your medications with you for your next visit with your Primary MD   Laboratory/radiological data: Please request your Primary MD to go over all hospital tests and procedure/radiological results at the follow up, please ask your Primary MD to get all Hospital records sent to his/her office.   In some cases, they will be blood work, cultures and biopsy results pending at the time of your discharge. Please request that your primary care M.D. goes through all the records of your hospital data and follows up on these results.   Also Note the following: If you experience worsening of your admission symptoms, develop shortness of breath, life  threatening emergency, suicidal or homicidal thoughts you must seek medical attention immediately by calling 911 or calling your MD immediately  if symptoms less severe.   You must read complete instructions/literature along with all the possible adverse reactions/side effects for all the Medicines you take and that have been prescribed to you. Take any new Medicines after you have completely understood and accpet all the possible adverse reactions/side effects.    Do not drive when taking Pain medications or sleeping medications (Benzodaizepines)   Do not take more than prescribed Pain, Sleep and Anxiety Medications. It is not advisable to combine anxiety,sleep and pain medications without talking with your primary care  practitioner   Special Instructions: If you have smoked or chewed Tobacco  in the last 2 yrs please stop smoking, stop any regular Alcohol  and or any Recreational drug use.   Wear Seat belts while driving.   Please note: You were cared for by a hospitalist during your hospital stay. Once you are discharged, your primary care physician will handle any further medical issues. Please note that NO REFILLS for any discharge medications will be authorized once you are discharged, as it is imperative that you return to your primary care physician (or establish a relationship with a primary care physician if you do not have one) for your post hospital discharge needs so that they can reassess your need for medications and monitor your lab values.  Time coordinating discharge: 35 minutes  SIGNED:  Marzetta Board, MD, PhD 02/10/2022, 11:31 AM

## 2022-02-11 LAB — BPAM RBC
Blood Product Expiration Date: 202312022359
ISSUE DATE / TIME: 202311271013
Unit Type and Rh: 5100

## 2022-02-11 LAB — TYPE AND SCREEN
ABO/RH(D): O POS
Antibody Screen: NEGATIVE
Unit division: 0

## 2022-02-12 LAB — HEPATITIS B SURFACE ANTIBODY, QUANTITATIVE: Hep B S AB Quant (Post): 161.3 m[IU]/mL (ref >9.9)

## 2022-02-14 LAB — TYPE AND SCREEN
ABO/RH(D): O POS
Antibody Screen: NEGATIVE
Unit division: 0

## 2022-02-14 LAB — BPAM RBC
Blood Product Expiration Date: 202312022359
ISSUE DATE / TIME: 202311251708
Unit Type and Rh: 9500

## 2022-02-24 ENCOUNTER — Ambulatory Visit (INDEPENDENT_AMBULATORY_CARE_PROVIDER_SITE_OTHER): Payer: Medicare Other | Admitting: Physician Assistant

## 2022-02-24 ENCOUNTER — Telehealth: Payer: Self-pay

## 2022-02-24 VITALS — BP 96/63 | HR 65 | Temp 96.5°F | Resp 14 | Ht 64.0 in | Wt 122.0 lb

## 2022-02-24 DIAGNOSIS — N186 End stage renal disease: Secondary | ICD-10-CM

## 2022-02-24 DIAGNOSIS — Z992 Dependence on renal dialysis: Secondary | ICD-10-CM

## 2022-02-24 NOTE — Progress Notes (Signed)
POST OPERATIVE OFFICE NOTE    CC:  F/u for surgery  HPI:  This is a 46 y.o. female who is s/p left arm shuntogram with venoplasty of the left upper extremity dialysis graft as well as stenting with Viabahn and resection of necrotic skin ulceration by Dr. Trula Slade on 02/09/2022.  She was added on as a urgent triage due to nonhealing surgical wound.  She states the dialysis center removed her sutures last week.  She has noticed some thin yellow drainage from her surgical incision.  She denies any further bleeding.  She continues to dialyze via left arm hero graft at the [facility] dialysis center on a Monday Wednesday Friday schedule.  Allergies  Allergen Reactions   Dust Mite Extract Other (See Comments)    sneezing    Current Outpatient Medications  Medication Sig Dispense Refill   albuterol (PROVENTIL) (2.5 MG/3ML) 0.083% nebulizer solution Take 2.5 mg by nebulization 4 (four) times daily as needed for wheezing or shortness of breath.      ALPRAZolam (XANAX) 0.25 MG tablet Take 0.25 mg by mouth 2 (two) times daily.     ANTACID 500 MG chewable tablet Chew 2 tablets by mouth 2 (two) times daily.     aspirin EC 81 MG tablet Take 81 mg by mouth daily.     chlorhexidine (PERIDEX) 0.12 % solution Use as directed 5 mLs in the mouth or throat 2 (two) times daily as needed.      clopidogrel (PLAVIX) 75 MG tablet Take 75 mg by mouth daily.     docusate sodium (COLACE) 100 MG capsule Take 100 mg by mouth daily.     epoetin alfa (EPOGEN,PROCRIT) 4000 UNIT/ML injection Inject 4,000 Units into the vein See admin instructions. Mon, wed, and Friday at dialysis     guaiFENesin (ROBITUSSIN) 100 MG/5ML liquid Take 200 mg by mouth every 6 (six) hours as needed for cough.      hydroxychloroquine (PLAQUENIL) 200 MG tablet Take 200 mg by mouth daily.     ipratropium (ATROVENT) 0.02 % nebulizer solution Take 0.5 mg by nebulization 4 (four) times daily as needed for wheezing or shortness of breath.      lidocaine-prilocaine (EMLA) cream Apply 1 application topically as needed (port access).      loratadine (CLARITIN) 10 MG tablet Take 10 mg by mouth daily as needed for allergies.     midodrine (PROAMATINE) 10 MG tablet Take 10 mg by mouth 2 (two) times daily.      mirtazapine (REMERON) 15 MG tablet Take 7.5 mg by mouth at bedtime.      multivitamin (RENA-VIT) TABS tablet Take 1 tablet by mouth daily.     pantoprazole (PROTONIX) 40 MG tablet Take 40 mg by mouth daily.     phenytoin (DILANTIN) 100 MG ER capsule Take 100 mg by mouth 2 (two) times daily.      RESTASIS 0.05 % ophthalmic emulsion Place 1 drop into both eyes 2 (two) times daily.     sertraline (ZOLOFT) 100 MG tablet Take 200 mg by mouth daily.      simvastatin (ZOCOR) 40 MG tablet Take 40 mg by mouth at bedtime.      No current facility-administered medications for this visit.     ROS:  See HPI  Physical Exam:  Vitals:   02/24/22 1107  BP: 96/63  Pulse: 65  Resp: 14  Temp: (!) 96.5 F (35.8 C)  TempSrc: Temporal  SpO2: 100%  Weight: 122 lb (55.3 kg)  Height: '5\' 4"'$  (1.626 m)    Incision: Left arm incision with mild dehiscence.  Bleeding or purulence with manipulation of incision; no surrounding erythema or areas of fluctuance Extremities: Palpable thrill through left arm hero graft  Assessment/Plan:  This is a 46 y.o. female who is s/p: Left arm shuntogram with venoplasty and stenting with Viabahn as well as resection of the chronic skin ulceration by Dr. Trula Slade  -Patent left arm hero graft with palpable thrill; L hand well perfused without signs or symptoms of steal -Surgical incision with some dehiscence likely due to early suture removal; no bleeding, purulence, or sign of infection of graft currently.  Cleanse incision with soap and water daily and apply a dry dressing. -Ok to continue cannulating left arm graft for HD.  Avoid incision until completely healed   Dagoberto Ligas, PA-C [facility] [phone]  Clinic MD:   Dr. Trula Slade also involved involved in the management plan of this patient today

## 2022-02-24 NOTE — Telephone Encounter (Signed)
Dr. Sherren Mocha Early called on office surgical phone line to report a triage call to him from [name] at pt's HD center. He stated that he was told the pt has pus-like drainage from her access site and she was hesitant to cannulate in that state. Dr. Donnetta Hutching asked if pt could be seen today and he can place a [facility] at [facility] on Tuesday, 12/12 for pt so she can get dialyzed. Appt scheduled for pt. [name] to coordinate with pt's transportation to get her to [facility]. Confirmed understanding.

## 2022-02-26 ENCOUNTER — Telehealth: Payer: Self-pay

## 2022-02-26 NOTE — Telephone Encounter (Signed)
I received a call from Hoyle Sauer, Beg[place] with [facility] in [place] re: mutual patient [name]. She stated patient was sent to our office to be evaluated to remove the Infected AVG and place [facility] and have New Access placed. I spoke with our in house PA [facility] who evaluated her along with Dr. Trula Slade on 02/24/22.I advised Per Matt, him and Dr. 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 She requested a call back from either [facility] or Dr. Trula Slade to call Dr. Candiss Norse at [phone] directly. I also requested that the culture results are faxed so that our providers can review and better assess patient's needs. Both parties voiced understanding.

## 2022-03-04 ENCOUNTER — Telehealth: Payer: Self-pay

## 2022-03-04 NOTE — Telephone Encounter (Signed)
Dr. Augustin Coupe With [facility] called our office stating pt needs to have her LUE AVG removed due to infection. He states they are placing her [facility] today but recommends her following up within a week to discuss having her AVG removed. I spoke with pt's Facility to schedule her appt.

## 2022-03-13 ENCOUNTER — Ambulatory Visit (INDEPENDENT_AMBULATORY_CARE_PROVIDER_SITE_OTHER): Payer: Medicare Other | Admitting: Physician Assistant

## 2022-03-13 VITALS — BP 115/72 | HR 66 | Temp 97.9°F | Resp 20 | Ht 64.0 in | Wt 121.0 lb

## 2022-03-13 DIAGNOSIS — Z992 Dependence on renal dialysis: Secondary | ICD-10-CM | POA: Diagnosis not present

## 2022-03-13 DIAGNOSIS — N186 End stage renal disease: Secondary | ICD-10-CM

## 2022-03-13 NOTE — Progress Notes (Addendum)
Office Note     CC:  follow up Requesting Provider:  Carrolyn Meiers*  HPI: [name] is a 46 y.o. ([birth year]) female who presents for evaluation of left arm hero graft.  She underwent left arm shuntogram with venoplasty and stenting with Viabahn and resection of necrotic skin due to a bleeding episode.  This was performed by Dr. Trula Slade on 02/09/2022.  She followed up in the office on 02/24/2022 with myself and Dr. Trula Slade.  At that time she had some superficial wound dehiscence at her incision site however there was no fluctuance or evidence of infection.  Since that time she has had positive wound cultures and was started on the appropriate antibiotics during HD based on susceptibility data.  She is also since had a right femoral TDC placed by Dr. Augustin Coupe on 03/04/2022.  Nephrology as well as the dialysis center has recommended left arm hero graft removal and she returns to discuss this today.  She is accompanied by a CMA from her facility.  They state the incision seems to be improving and there is only a minor area remaining to heal.  She denies any fevers, chills, redness or warmth to the left arm.  She says there is still some yellowish drainage collection on her bandages however this has slowed.  She is dialyzing with [facility] on a Monday Wednesday Friday schedule.   Past Medical History:  Diagnosis Date   Anemia    BV (bacterial vaginosis) 11/08/2013   Depression    Dialysis patient [facility])    DVT (deep venous thrombosis) ([place])    Hypertension    Lupus ([place])    Renal disorder    Vaginal odor 11/08/2013    Past Surgical History:  Procedure Laterality Date   A/V SHUNT INTERVENTION N/A 10/24/2016   Procedure: A/V SHUNT INTERVENTION;  Surgeon: Katha Cabal, MD;  Location: [place] [facility];  Service: Cardiovascular;  Laterality: N/A;   A/V SHUNTOGRAM Left 12/08/2017   Procedure: A/V SHUNTOGRAM;  Surgeon: Katha Cabal, MD;  Location: [place] [facility];  Service: Cardiovascular;  Laterality: Left;   A/V SHUNTOGRAM Left 01/28/2018   Procedure: A/V SHUNTOGRAM;  Surgeon: Algernon Huxley, MD;  Location: [place] [facility];  Service: Cardiovascular;  Laterality: Left;   A/V SHUNTOGRAM Left 04/01/2018   Procedure: A/V SHUNTOGRAM;  Surgeon: Algernon Huxley, MD;  Location: [place] [facility];  Service: Cardiovascular;  Laterality: Left;   A/V SHUNTOGRAM Left 04/28/2019   Procedure: A/V SHUNTOGRAM;  Surgeon: Algernon Huxley, MD;  Location: [place] [facility];  Service: Cardiovascular;  Laterality: Left;   AV FISTULA PLACEMENT     AV FISTULA PLACEMENT Left 02/09/2022   Procedure: FISTULAGRAM, placement of left arm stent.;  Surgeon: Serafina Mitchell, MD;  Location: [place];  Service: Vascular;  Laterality: Left;   DIALYSIS/PERMA CATHETER INSERTION  10/23/2016   Procedure: DIALYSIS/PERMA CATHETER INSERTION;  Surgeon: Algernon Huxley, MD;  Location: [place] [facility];  Service: Cardiovascular;;   IR REMOVAL TUN CV CATH W/O FL  09/22/2017   IR THROMBECTOMY AV FISTULA W/THROMBOLYSIS INC/SHUNT/IMG LEFT Left 09/04/2017   IR US GUIDE VASC ACCESS LEFT  09/04/2017   MASS EXCISION Left 06/01/2018   Procedure: EXCISION 5 X 3CM LIPOMA LEFT THIGH;  Surgeon: Aviva Signs, MD;  Location: [facility];  Service: General;  Laterality: Left;   PERIPHERAL VASCULAR CATHETERIZATION Left 01/04/2015   Procedure: A/V Shuntogram/Fistulagram;  Surgeon: Algernon Huxley, MD;  Location: [place] [facility];  Service: Cardiovascular;  Laterality: Left;   PERIPHERAL VASCULAR CATHETERIZATION N/A 01/04/2015   Procedure: A/V Shunt Intervention;  Surgeon: Algernon Huxley, MD;  Location: [place] [facility];  Service: Cardiovascular;  Laterality: N/A;   PERIPHERAL VASCULAR CATHETERIZATION Left 04/13/2015   Procedure: A/V Shuntogram/Fistulagram;  Surgeon: Katha Cabal, MD;  Location: [place] [facility];  Service: Cardiovascular;  Laterality: Left;   PERIPHERAL VASCULAR CATHETERIZATION Left  04/13/2015   Procedure: A/V Shunt Intervention;  Surgeon: Katha Cabal, MD;  Location: [place] [facility];  Service: Cardiovascular;  Laterality: Left;    Social History   Socioeconomic History   Marital status: Single    Spouse name: Not on file   Number of children: Not on file   Years of education: Not on file   Highest education level: Not on file  Occupational History   Not on file  Tobacco Use   Smoking status: Every Day    Packs/day: 0.10    Years: 15.00    Total pack years: 1.50    Types: Cigarettes    Passive exposure: Current   Smokeless tobacco: Never  Vaping Use   Vaping Use: Never used  Substance and Sexual Activity   Alcohol use: No   Drug use: No   Sexual activity: Not Currently    Birth control/protection: None  Other Topics Concern   Not on file  Social History Narrative   Lives at [facility]   Social Determinants of Health   Financial Resource Strain: Not on file  Food Insecurity: No Food Insecurity (02/09/2022)   Hunger Vital Sign    Worried About Running Out of Food in the Last Year: Never true    Ran Out of Food in the Last Year: Never true  Transportation Needs: No Transportation Needs (02/09/2022)   PRAPARE - Hydrologist (Medical): No    Lack of Transportation (Non-Medical): No  Physical Activity: Not on file  Stress: Not on file  Social Connections: Not on file  Intimate Partner Violence: Not At Risk (02/09/2022)   Humiliation, Afraid, Rape, and Kick questionnaire    Fear of Current or Ex-Partner: No    Emotionally Abused: No    Physically Abused: No    Sexually Abused: No    Family History  Problem Relation Age of Onset   Seizures Son    Kidney disease Mother        on dialysis   Lupus Mother     Current Outpatient Medications  Medication Sig Dispense Refill   albuterol (PROVENTIL) (2.5 MG/3ML) 0.083% nebulizer solution Take 2.5 mg by nebulization 4 (four) times daily as needed  for wheezing or shortness of breath.      ALPRAZolam (XANAX) 0.25 MG tablet Take 0.25 mg by mouth 2 (two) times daily.     ANTACID 500 MG chewable tablet Chew 2 tablets by mouth 2 (two) times daily.     aspirin EC 81 MG tablet Take 81 mg by mouth daily.     chlorhexidine (PERIDEX) 0.12 % solution Use as directed 5 mLs in the mouth or throat 2 (two) times daily as needed.      clopidogrel (PLAVIX) 75 MG tablet Take 75 mg by mouth daily.     docusate sodium (COLACE) 100 MG capsule Take 100 mg by mouth daily.     epoetin alfa (EPOGEN,PROCRIT) 4000 UNIT/ML injection Inject 4,000 Units into the vein See admin instructions.  Mon, wed, and Friday at dialysis     guaiFENesin (ROBITUSSIN) 100 MG/5ML liquid Take 200 mg by mouth every 6 (six) hours as needed for cough.      hydroxychloroquine (PLAQUENIL) 200 MG tablet Take 200 mg by mouth daily.     ipratropium (ATROVENT) 0.02 % nebulizer solution Take 0.5 mg by nebulization 4 (four) times daily as needed for wheezing or shortness of breath.     lidocaine-prilocaine (EMLA) cream Apply 1 application topically as needed (port access).      loratadine (CLARITIN) 10 MG tablet Take 10 mg by mouth daily as needed for allergies.     midodrine (PROAMATINE) 10 MG tablet Take 10 mg by mouth 2 (two) times daily.      mirtazapine (REMERON) 15 MG tablet Take 7.5 mg by mouth at bedtime.      multivitamin (RENA-VIT) TABS tablet Take 1 tablet by mouth daily.     pantoprazole (PROTONIX) 40 MG tablet Take 40 mg by mouth daily.     phenytoin (DILANTIN) 100 MG ER capsule Take 100 mg by mouth 2 (two) times daily.      RESTASIS 0.05 % ophthalmic emulsion Place 1 drop into both eyes 2 (two) times daily.     sertraline (ZOLOFT) 100 MG tablet Take 200 mg by mouth daily.      simvastatin (ZOCOR) 40 MG tablet Take 40 mg by mouth at bedtime.      No current facility-administered medications for this visit.    Allergies  Allergen Reactions   Dust Mite Extract Other (See Comments)     sneezing     REVIEW OF SYSTEMS:   '[X]'$  denotes positive finding, '[ ]'$  denotes negative finding Cardiac  Comments:  Chest pain or chest pressure:    Shortness of breath upon exertion:    Short of breath when lying flat:    Irregular heart rhythm:        Vascular    Pain in calf, thigh, or hip brought on by ambulation:    Pain in feet at night that wakes you up from your sleep:     Blood clot in your veins:    Leg swelling:         Pulmonary    Oxygen at home:    Productive cough:     Wheezing:         Neurologic    Sudden weakness in arms or legs:     Sudden numbness in arms or legs:     Sudden onset of difficulty speaking or slurred speech:    Temporary loss of vision in one eye:     Problems with dizziness:         Gastrointestinal    Blood in stool:     Vomited blood:         Genitourinary    Burning when urinating:     Blood in urine:        Psychiatric    Major depression:         Hematologic    Bleeding problems:    Problems with blood clotting too easily:        Skin    Rashes or ulcers:        Constitutional    Fever or chills:      PHYSICAL EXAMINATION:  Vitals:   03/13/22 1104  BP: 115/72  Pulse: 66  Resp: 20  Temp: 97.9 F (36.6 C)  TempSrc: Temporal  SpO2: 100%  Weight: 121  lb (54.9 kg)  Height: '5\' 4"'$  (1.626 m)    General:  WDWN in NAD; vital signs documented above Gait: Not observed HENT: WNL, normocephalic Pulmonary: normal non-labored breathing , without Rales, rhonchi,  wheezing Cardiac: regular HR Abdomen: soft, NT, no masses Skin: without rashes Vascular Exam/Pulses:  Right Left  Radial 2+ (normal) 1+ (weak)   Extremities: Palpable thrill throughout left arm hero graft; no drainage or purulent material with manipulation of the incision; small area remaining to heal pictured below; no surrounding erythema or areas of fluctuance along the graft Musculoskeletal: no muscle wasting or atrophy  Neurologic: A&O X 3;  No  focal weakness or paresthesias are detected Psychiatric:  The pt has Normal affect.       ASSESSMENT/PLAN:: 46 y.o. female with left arm hero graft  -Since last office visit patient was started on IV antibiotics based on susceptibility data to a positive culture from left arm incision.  Patient is currently dialyzing via right femoral TDC.  The incision pictured above has a small area remaining to heal.  This is a drastic improvement from last office visit.    On my exam there are no areas of redness or areas of fluctuance.  With manipulation of the wound I was not able to express any purulent material.  During office visit today I called Vallory's guardian to discuss excision of left arm AV graft.  I explained to her that the incision is nearly healed compared to last office visit 2 weeks ago.  Excision of graft would involve extensive dissection of the upper arm and shoulder to remove all graft material.  There would be a high risk of ongoing wound complications after surgery.  Our recommendation would be to continue antibiotics and cleansing of the incision with soap and water daily.  We would prefer the graft to declare itself with a frank infection.  Ultimately, the graft may need to be excised however there is no indication currently.  Almyra Free, Aaliyana's guardian, would like to discuss the above with her team, Nephrology, and the patient prior to making any final decision.  She can be scheduled over the phone without another office visit for graft excision if everyone is in agreement.  She will contact our office when a decision is reached.  For now we will schedule wound check in another 2 to 3 weeks.   Dagoberto Ligas, PA-C [facility] [phone]  Clinic MD:   Trula Slade

## 2022-06-18 ENCOUNTER — Ambulatory Visit (INDEPENDENT_AMBULATORY_CARE_PROVIDER_SITE_OTHER): Payer: Medicare Other | Admitting: Vascular Surgery

## 2022-06-18 ENCOUNTER — Encounter: Payer: Self-pay | Admitting: Vascular Surgery

## 2022-06-18 VITALS — BP 119/77 | HR 63 | Temp 97.2°F | Ht 64.0 in | Wt 122.0 lb

## 2022-06-18 DIAGNOSIS — N186 End stage renal disease: Secondary | ICD-10-CM | POA: Diagnosis not present

## 2022-06-18 DIAGNOSIS — Z992 Dependence on renal dialysis: Secondary | ICD-10-CM

## 2022-06-18 NOTE — Progress Notes (Signed)
[facility] [place]  Patient name: [name] MRN: [id number] DOB: 10/13/1975 Sex: female  REASON FOR VISIT: Evaluation of hemodialysis access options  HPI: [name] is a 47 y.o. female here today for discussion of options for hemodialysis.  She is here with the administrator of her long-term care facility.  She has a very long and complicated history of hemodialysis access.  The majority of her treatment has been at [facility].  She had a left upper arm loop hero graft that has been present since before 2016.  I do not have chart information prior to that time.  She was seen by our office in November 2023.  She had evidence of poor functioning and probable central occlusive stenosis.  She was to undergo fistulogram and possible intervention.  On 02/09/2022 she underwent fistulogram and placement of a covered stent in her outflow vein with Dr. Trula Slade.  She also had resection of an area of necrotic skin over the fistula.  He had poor healing of this and had cultures suggestive of graft infection and recommendation been removal of the graft.  At our last office visit in December 2023 she was having some healing of this and then was lost to follow-up.  She presents today for further discussion.  Currently is dialyzed via right femoral tunneled catheter.  Past Medical History:  Diagnosis Date   Anemia    BV (bacterial vaginosis) 11/08/2013   Depression    Dialysis patient    DVT (deep venous thrombosis)    Hypertension    Lupus    Renal disorder    Vaginal odor 11/08/2013    Family History  Problem Relation Age of Onset   Seizures Son    Kidney disease Mother        on dialysis   Lupus Mother     SOCIAL HISTORY: Social History   Tobacco Use   Smoking status: Every Day    Packs/day: 0.10    Years: 15.00    Additional pack years: 0.00    Total pack years: 1.50    Types: Cigarettes    Passive  exposure: Current   Smokeless tobacco: Never  Substance Use Topics   Alcohol use: No    Allergies  Allergen Reactions   Dust Mite Extract Other (See Comments)    sneezing    Current Outpatient Medications  Medication Sig Dispense Refill   albuterol (PROVENTIL) (2.5 MG/3ML) 0.083% nebulizer solution Take 2.5 mg by nebulization 4 (four) times daily as needed for wheezing or shortness of breath.      ALPRAZolam (XANAX) 0.25 MG tablet Take 0.25 mg by mouth 2 (two) times daily.     ANTACID 500 MG chewable tablet Chew 2 tablets by mouth 2 (two) times daily.     aspirin EC 81 MG tablet Take 81 mg by mouth daily.     Calcium Carbonate Antacid (TUMS PO) Take 1 tablet by mouth 2 (two) times daily.     chlorhexidine (PERIDEX) 0.12 % solution Use as directed 5 mLs in the mouth or throat 2 (two) times daily as needed.      clopidogrel (PLAVIX) 75 MG tablet Take 75 mg by mouth daily.     docusate sodium (COLACE) 100 MG capsule Take 100 mg by mouth daily.     epoetin alfa (EPOGEN,PROCRIT) 4000 UNIT/ML injection Inject 4,000 Units into the vein See admin instructions. Mon, wed, and Friday at dialysis     guaiFENesin (ROBITUSSIN)  100 MG/5ML liquid Take 200 mg by mouth every 6 (six) hours as needed for cough.      hydroxychloroquine (PLAQUENIL) 200 MG tablet Take 200 mg by mouth daily.     ipratropium (ATROVENT) 0.02 % nebulizer solution Take 0.5 mg by nebulization 4 (four) times daily as needed for wheezing or shortness of breath.     lidocaine-prilocaine (EMLA) cream Apply 1 application topically as needed (port access).      LOKELMA 5 g packet Take 1 packet by mouth daily.     loratadine (CLARITIN) 10 MG tablet Take 10 mg by mouth daily as needed for allergies.     midodrine (PROAMATINE) 10 MG tablet Take 10 mg by mouth 2 (two) times daily.      mirtazapine (REMERON) 15 MG tablet Take 7.5 mg by mouth at bedtime.      multivitamin (RENA-VIT) TABS tablet Take 1 tablet by mouth daily.     pantoprazole  (PROTONIX) 40 MG tablet Take 40 mg by mouth daily.     phenytoin (DILANTIN) 100 MG ER capsule Take 100 mg by mouth 2 (two) times daily.      RESTASIS 0.05 % ophthalmic emulsion Place 1 drop into both eyes 2 (two) times daily.     sertraline (ZOLOFT) 100 MG tablet Take 200 mg by mouth daily.      simvastatin (ZOCOR) 40 MG tablet Take 40 mg by mouth at bedtime.      No current facility-administered medications for this visit.    REVIEW OF SYSTEMS:  [X]  denotes positive finding, [ ]  denotes negative finding Cardiac  Comments:  Chest pain or chest pressure:    Shortness of breath upon exertion:    Short of breath when lying flat:    Irregular heart rhythm:        Vascular    Pain in calf, thigh, or hip brought on by ambulation:    Pain in feet at night that wakes you up from your sleep:     Blood clot in your veins:    Leg swelling:           PHYSICAL EXAM: Vitals:   06/18/22 0923  BP: 119/77  Pulse: 63  Temp: (!) 97.2 F (36.2 C)  SpO2: 100%  Weight: 122 lb (55.3 kg)  Height: 5\' 4"  (1.626 m)    GENERAL: The patient is a well-nourished female, in no acute distress. The vital signs are documented above. CARDIOVASCULAR: She has complete healing of her left upper arm loop hero graft.  She has an excellent thrill in the hero graft.  There is scarring from multiple punctures over her graft but no evidence of fluid collection PULMONARY: There is good air exchange  MUSCULOSKELETAL: There are no major deformities or cyanosis. NEUROLOGIC: No focal weakness or paresthesias are detected. SKIN: There are no ulcers or rashes noted. PSYCHIATRIC: The patient has a normal affect.  DATA:  None  MEDICAL ISSUES: Patient is now completely healed all incisions.  The has been healed for quite some time since her December 2023 visit.  I would recommend attempting to access her left upper arm hero graft.  If this is successful she can have her femoral catheter removed.  If her left upper arm  hero graft is not successfully used, she will need new access.  On physical exam she does have an incision over her right brachial artery.  There is no evidence of prior graft placement.  In reviewing all her old records, I cannot  see where she has any documentation of right subclavian vein occlusion.  This would need to be confirmed that she has patency prior to placement of right arm graft.  This would be done with a right arm venogram.  If her vein is patent, she could have right upper arm AV graft placement.  If her right subclavian vein is occluded, she would need a femoral loop graft.  This could be done on her left thigh.  Please she will have successful access of her left arm hero graft.  Are available for further access attempts if this is unsuccessful    Rosetta Posner, MD FACS [facility] [place] Office Tel [phone]  Note: Portions of this report may have been transcribed using voice recognition software.  Every effort has been made to ensure accuracy; however, inadvertent computerized transcription errors may still be present.

## 2022-06-25 ENCOUNTER — Encounter (HOSPITAL_COMMUNITY): Payer: Self-pay | Admitting: Emergency Medicine

## 2022-06-25 ENCOUNTER — Other Ambulatory Visit: Payer: Self-pay

## 2022-06-25 ENCOUNTER — Emergency Department (HOSPITAL_COMMUNITY)
Admission: EM | Admit: 2022-06-25 | Discharge: 2022-06-25 | Disposition: A | Discharge status: Home / Self Care | Payer: Medicare Other | Attending: Emergency Medicine | Admitting: Emergency Medicine

## 2022-06-25 DIAGNOSIS — T82838A Hemorrhage of vascular prosthetic devices, implants and grafts, initial encounter: Secondary | ICD-10-CM | POA: Insufficient documentation

## 2022-06-25 DIAGNOSIS — Y69 Unspecified misadventure during surgical and medical care: Secondary | ICD-10-CM | POA: Diagnosis not present

## 2022-06-25 DIAGNOSIS — Z7982 Long term (current) use of aspirin: Secondary | ICD-10-CM | POA: Insufficient documentation

## 2022-06-25 DIAGNOSIS — R58 Hemorrhage, not elsewhere classified: Secondary | ICD-10-CM

## 2022-06-25 NOTE — ED Triage Notes (Signed)
Called [name] to pick pt up. No answer, message left.   [name] [phone]

## 2022-06-25 NOTE — ED Notes (Signed)
[name] is listed as legal guardian and was contacted regarding pt discharge. Mary at group home also informed of pt need for transportation back to facility.

## 2022-06-25 NOTE — ED Triage Notes (Signed)
BIB EMS for fistula bleeding. EMS dressed with guaze. Not bleeding at this time. Pt denies any pain. States site was not bleeding at time she left dialysis but started bleeding shortly after getting home.

## 2022-06-25 NOTE — Discharge Instructions (Addendum)
Leave the bandage on your arm until you are seen for your dialysis in 2 days.  Return if problems

## 2022-06-25 NOTE — ED Provider Notes (Signed)
[place] EMERGENCY DEPARTMENT AT [facility] Provider Note   CSN: [id number] Arrival date & time: 06/25/22  1504     History  Chief Complaint  Patient presents with   Fistula Bleeding    [name] is a 47 y.o. female.  Patient has a fistula in her  arm where she gets her dialysis.  After dialysis she was having some bleeding.  EMS put a bandage on it.  And the bleeding is stopped  The history is provided by the patient and medical records.  Arm Injury Location:  Arm Pain details:    Quality:  Aching   Radiates to:  Does not radiate   Severity:  Mild   Onset quality:  Sudden   Timing:  Constant   Progression:  Waxing and waning Dislocation: no   Associated symptoms: no back pain and no fatigue        Home Medications Prior to Admission medications   Medication Sig Start Date End Date Taking? Authorizing Provider  albuterol (PROVENTIL) (2.5 MG/3ML) 0.083% nebulizer solution Take 2.5 mg by nebulization 4 (four) times daily as needed for wheezing or shortness of breath.     [provider]  ALPRAZolam Prudy Feeler) 0.25 MG tablet Take 0.25 mg by mouth 2 (two) times daily.    [provider]  ANTACID 500 MG chewable tablet Chew 2 tablets by mouth 2 (two) times daily. 01/20/22   [provider]  aspirin EC 81 MG tablet Take 81 mg by mouth daily.    [provider]  Calcium Carbonate Antacid (TUMS PO) Take 1 tablet by mouth 2 (two) times daily. 06/13/22   [provider]  chlorhexidine (PERIDEX) 0.12 % solution Use as directed 5 mLs in the mouth or throat 2 (two) times daily as needed.     [provider]  clopidogrel (PLAVIX) 75 MG tablet Take 75 mg by mouth daily.    [provider]  docusate sodium (COLACE) 100 MG capsule Take 100 mg by mouth daily.    [provider]  epoetin alfa (EPOGEN,PROCRIT) 4000 UNIT/ML injection Inject 4,000 Units into the vein See admin instructions. Mon, wed, and  Friday at dialysis    [provider]  guaiFENesin (ROBITUSSIN) 100 MG/5ML liquid Take 200 mg by mouth every 6 (six) hours as needed for cough.     [provider]  hydroxychloroquine (PLAQUENIL) 200 MG tablet Take 200 mg by mouth daily.    [provider]  ipratropium (ATROVENT) 0.02 % nebulizer solution Take 0.5 mg by nebulization 4 (four) times daily as needed for wheezing or shortness of breath.    [provider]  lidocaine-prilocaine (EMLA) cream Apply 1 application topically as needed (port access).  12/20/12   [provider]  LOKELMA 5 g packet Take 1 packet by mouth daily. 06/11/22   [provider]  loratadine (CLARITIN) 10 MG tablet Take 10 mg by mouth daily as needed for allergies.    [provider]  midodrine (PROAMATINE) 10 MG tablet Take 10 mg by mouth 2 (two) times daily.     [provider]  mirtazapine (REMERON) 15 MG tablet Take 7.5 mg by mouth at bedtime.  11/03/13   [provider]  multivitamin (RENA-VIT) TABS tablet Take 1 tablet by mouth daily.    [provider]  pantoprazole (PROTONIX) 40 MG tablet Take 40 mg by mouth daily.    [provider]  phenytoin (DILANTIN) 100 MG ER capsule Take 100  mg by mouth 2 (two) times daily.     [provider]  RESTASIS 0.05 % ophthalmic emulsion Place 1 drop into both eyes 2 (two) times daily. 01/31/21   [provider]  sertraline (ZOLOFT) 100 MG tablet Take 200 mg by mouth daily.     [provider]  simvastatin (ZOCOR) 40 MG tablet Take 40 mg by mouth at bedtime.     [provider]      Allergies    Dust mite extract    Review of Systems   Review of Systems  Constitutional:  Negative for appetite change and fatigue.  HENT:  Negative for congestion, ear discharge and sinus pressure.   Eyes:  Negative for discharge.  Respiratory:  Negative for cough.   Cardiovascular:  Negative for chest pain.   Gastrointestinal:  Negative for abdominal pain and diarrhea.  Genitourinary:  Negative for frequency and hematuria.  Musculoskeletal:  Negative for back pain.       Bleeding from arm fistula  Skin:  Negative for rash.  Neurological:  Negative for seizures and headaches.  Psychiatric/Behavioral:  Negative for hallucinations.     Physical Exam Updated Vital Signs BP 108/77 (BP Location: Right Arm)   Pulse 67   Temp 98.1 F (36.7 C) (Oral)   Resp 16   Wt 55 kg   LMP 06/18/2022 (Approximate)   SpO2 99%   BMI 20.81 kg/m  Physical Exam Vitals and nursing note reviewed.  Constitutional:      Appearance: She is well-developed.  HENT:     Head: Normocephalic.     Mouth/Throat:     Mouth: Mucous membranes are moist.  Eyes:     General: No scleral icterus.    Conjunctiva/sclera: Conjunctivae normal.  Neck:     Thyroid: No thyromegaly.  Cardiovascular:     Rate and Rhythm: Normal rate and regular rhythm.     Heart sounds: No murmur heard.    No friction rub. No gallop.  Pulmonary:     Breath sounds: No stridor. No wheezing or rales.  Chest:     Chest wall: No tenderness.  Abdominal:     General: There is no distension.     Tenderness: There is no abdominal tenderness. There is no rebound.  Musculoskeletal:        General: Normal range of motion.     Cervical back: Neck supple.     Comments: Patient has a pressure dressing on her arm where the fistula is.  The bleeding has stopped  Lymphadenopathy:     Cervical: No cervical adenopathy.  Skin:    Findings: No erythema or rash.  Neurological:     Mental Status: She is alert and oriented to person, place, and time.     Motor: No abnormal muscle tone.     Coordination: Coordination normal.  Psychiatric:        Behavior: Behavior normal.     ED Results / Procedures / Treatments   Labs (all labs ordered are listed, but only abnormal results are displayed) Labs Reviewed - No data to display  EKG None  Radiology No  results found.  Procedures Procedures    Medications Ordered in ED Medications - No data to display  ED Course/ Medical Decision Making/ A&P                             Medical Decision Making  Bleeding from fistula has stopped.  Patient will be discharged home and get her dialysis in 2 days.  She will return if any problems        Final Clinical Impression(s) / ED Diagnoses Final diagnoses:  Bleeding    Rx / DC Orders ED Discharge Orders     None         Bethann BerkshireZammit, Lister Brizzi, MD 06/27/22 1154

## 2022-06-27 ENCOUNTER — Telehealth (HOSPITAL_COMMUNITY): Payer: Self-pay | Admitting: *Deleted

## 2022-06-27 NOTE — Telephone Encounter (Signed)
Received fax from Royann Shivers NP requesting CVC removal. Will give to [facility].

## 2022-06-30 ENCOUNTER — Other Ambulatory Visit: Payer: Self-pay

## 2022-06-30 ENCOUNTER — Telehealth: Payer: Self-pay

## 2022-06-30 NOTE — Telephone Encounter (Signed)
Scheduled pt's CVC removal for 07/15/22. I have spoken with Willaim Rayas and Zella Ball at HD center to give them pre op instructions. I was given Jamesetta So number to schedule with her per pt's residing facility. Willaim Rayas stated she will let pt's guardian know. No questions/concerns at this time.

## 2022-07-01 ENCOUNTER — Ambulatory Visit (INDEPENDENT_AMBULATORY_CARE_PROVIDER_SITE_OTHER): Payer: Medicare Other | Admitting: Adult Health

## 2022-07-01 ENCOUNTER — Encounter: Payer: Self-pay | Admitting: Adult Health

## 2022-07-01 ENCOUNTER — Other Ambulatory Visit (HOSPITAL_COMMUNITY)
Admission: RE | Admit: 2022-07-01 | Discharge: 2022-07-01 | Disposition: A | Discharge status: Home / Self Care | Payer: Medicare Other | Source: Ambulatory Visit | Attending: Adult Health | Admitting: Adult Health

## 2022-07-01 VITALS — BP 110/68 | HR 73 | Ht 64.0 in | Wt 121.0 lb

## 2022-07-01 DIAGNOSIS — Z1151 Encounter for screening for human papillomavirus (HPV): Secondary | ICD-10-CM

## 2022-07-01 DIAGNOSIS — Z0000 Encounter for general adult medical examination without abnormal findings: Secondary | ICD-10-CM | POA: Diagnosis not present

## 2022-07-01 DIAGNOSIS — Z01419 Encounter for gynecological examination (general) (routine) without abnormal findings: Secondary | ICD-10-CM | POA: Diagnosis not present

## 2022-07-01 DIAGNOSIS — Z1211 Encounter for screening for malignant neoplasm of colon: Secondary | ICD-10-CM | POA: Diagnosis not present

## 2022-07-01 LAB — HEMOCCULT GUIAC POC 1CARD (OFFICE): Fecal Occult Blood, POC: NEGATIVE

## 2022-07-01 NOTE — Progress Notes (Signed)
Patient ID: [name], female   DOB: December 29, 1975, 47 y.o.   MRN: [id number] History of Present Illness: [name] is a 47 year old black female,single, G3P3002 in for a well woman gyn exam and pap. She resides at [facility] care 2  PCP is Dr Felecia Shelling   Current Medications, Allergies, Past Medical History, Past Surgical History, Family History and Social History were reviewed in [employer] electronic medical record.     Review of Systems: Patient denies any headaches, hearing loss, fatigue, blurred vision, shortness of breath, chest pain, abdominal pain, problems with bowel movements, urination, or intercourse.(Not active). No joint pain or mood swings.  Has regular periods Is on dialysis    Physical Exam:BP 110/68 (BP Location: Right Arm, Patient Position: Sitting, Cuff Size: Normal)   Pulse 73   Ht  (1.626 m)   Wt 121 lb (54.9 kg)   LMP 06/18/2022 (Approximate)   BMI 20.77 kg/m   General:  Well developed, well nourished, no acute distress Skin:  Warm and dry Neck:  Midline trachea, normal thyroid, good ROM, no lymphadenopathy Lungs; Clear to auscultation bilaterally Breast:  No dominant palpable mass, retraction, or nipple discharge Cardiovascular: Regular rate and rhythm Abdomen:  Soft, non tender, no hepatosplenomegaly Pelvic:  External genitalia is normal in appearance, no lesions.  The vagina is normal in appearance. Urethra has no lesions or masses. The cervix is smooth, pap with HR HPV genotyping performed.  Uterus is felt to be normal size, shape, and contour.  No adnexal masses or tenderness noted.Bladder is non tender, no masses felt. Rectal: Good sphincter tone, no polyps, or hemorrhoids felt.  Hemoccult negative. Extremities/musculoskeletal:  No swelling or varicosities noted, no clubbing or cyanosis Psych:  No mood changes, alert and cooperative,seems happy AA is 0 Fall risk is low    07/01/2022   11:19 AM 05/01/2016   10:48 AM  Depression screen PHQ  2/9  Decreased Interest 1 0  Down, Depressed, Hopeless 0 0  PHQ - 2 Score 1 0  Altered sleeping 0   Tired, decreased energy 3   Change in appetite 0   Feeling bad or failure about yourself  0   Trouble concentrating 0   Moving slowly or fidgety/restless 0   Suicidal thoughts 0   PHQ-9 Score 4        07/01/2022   11:20 AM  GAD 7 : Generalized Anxiety Score  Nervous, Anxious, on Edge 0  Control/stop worrying 0  Worry too much - different things 0  Trouble relaxing 0  Restless 0  Easily annoyed or irritable 0  Afraid - awful might happen 0  Total GAD 7 Score 0      Upstream - 07/01/22 1126       Pregnancy Intention Screening   Does the patient want to become pregnant in the next year? No    Does the patient's partner want to become pregnant in the next year? No    Would the patient like to discuss contraceptive options today? No      Contraception Wrap Up   Current Method Abstinence    End Method Abstinence            Examination chaperoned by Malachy Mood LPN  Impression and Plan: 1. Encounter for gynecological examination with Papanicolaou smear of cervix Pap sent Pap in years if negative Physical with PCP Labs with PCP Mammogram was negative 10/03/21 - Cytology - PAP( [place])  2. Encounter for screening fecal occult blood  testing Hemoccult was negative  - POCT occult blood stool

## 2022-07-02 ENCOUNTER — Encounter (HOSPITAL_COMMUNITY): Payer: Self-pay

## 2022-07-02 ENCOUNTER — Other Ambulatory Visit: Payer: Self-pay

## 2022-07-02 ENCOUNTER — Emergency Department (HOSPITAL_COMMUNITY)
Admission: EM | Admit: 2022-07-02 | Discharge: 2022-07-02 | Disposition: A | Discharge status: Home / Self Care | Payer: Medicare Other | Attending: Emergency Medicine | Admitting: Emergency Medicine

## 2022-07-02 DIAGNOSIS — N189 Chronic kidney disease, unspecified: Secondary | ICD-10-CM | POA: Diagnosis not present

## 2022-07-02 DIAGNOSIS — T82838A Hemorrhage of vascular prosthetic devices, implants and grafts, initial encounter: Secondary | ICD-10-CM | POA: Insufficient documentation

## 2022-07-02 DIAGNOSIS — Z992 Dependence on renal dialysis: Secondary | ICD-10-CM | POA: Diagnosis not present

## 2022-07-02 DIAGNOSIS — Y712 Prosthetic and other implants, materials and accessory cardiovascular devices associated with adverse incidents: Secondary | ICD-10-CM | POA: Diagnosis not present

## 2022-07-02 DIAGNOSIS — I10 Essential (primary) hypertension: Secondary | ICD-10-CM | POA: Insufficient documentation

## 2022-07-02 DIAGNOSIS — E876 Hypokalemia: Secondary | ICD-10-CM | POA: Insufficient documentation

## 2022-07-02 LAB — CBC
HCT: 36.1 % (ref 36.0–46.0)
Hemoglobin: 12.4 g/dL (ref 12.0–15.0)
MCH: 28.5 pg (ref 26.0–34.0)
MCHC: 34.3 g/dL (ref 30.0–36.0)
MCV: 83 fL (ref 80.0–100.0)
Platelets: 216 10*3/uL (ref 150–400)
RBC: 4.35 MIL/uL (ref 3.87–5.11)
RDW: 16.1 % — ABNORMAL HIGH (ref 11.5–15.5)
WBC: 4.7 10*3/uL (ref 4.0–10.5)
nRBC: 0 % (ref 0.0–0.2)

## 2022-07-02 LAB — BASIC METABOLIC PANEL
Anion gap: 12 (ref 5–15)
BUN: 14 mg/dL (ref 6–20)
CO2: 31 mmol/L (ref 22–32)
Calcium: 9.5 mg/dL (ref 8.9–10.3)
Chloride: 92 mmol/L — ABNORMAL LOW (ref 98–111)
Creatinine, Ser: 4.16 mg/dL — ABNORMAL HIGH (ref 0.44–1.00)
GFR, Estimated: 13 mL/min — ABNORMAL LOW (ref >60)
Glucose, Bld: 108 mg/dL — ABNORMAL HIGH (ref 70–99)
Potassium: 2.9 mmol/L — ABNORMAL LOW (ref 3.5–5.1)
Sodium: 135 mmol/L (ref 135–145)

## 2022-07-02 LAB — PROTIME-INR
INR: 1.1 (ref 0.8–1.2)
Prothrombin Time: 14.1 seconds (ref 11.4–15.2)

## 2022-07-02 LAB — APTT: aPTT: 57 seconds — ABNORMAL HIGH (ref 24–36)

## 2022-07-02 NOTE — Telephone Encounter (Signed)
Received a call from [facility] at [facility] requesting to cancel [facility] removal due to patient now having problems with access.

## 2022-07-02 NOTE — ED Provider Notes (Signed)
[place] EMERGENCY DEPARTMENT AT [facility] Provider Note   CSN: [id number] Arrival date & time: 07/02/22  1300     History  Chief Complaint  Patient presents with   Vascular Access Problem    [name] is a 47 y.o. female.  HPI Patient presents With bleeding from her dialysis access.  On left upper extremity.  Has had bleeding.  States she did dialysis today and then had bleeding.  No lightheadedness or dizziness.  No other bleeding.  Has a history of bleedings at the sites in the past.  . Past Medical History:  Diagnosis Date   Anemia    BV (bacterial vaginosis) 11/08/2013   Depression    Dialysis patient    DVT (deep venous thrombosis)    Hypertension    Lupus    Renal disorder    Vaginal odor 11/08/2013   Past Surgical History:  Procedure Laterality Date   A/V SHUNT INTERVENTION N/A 10/24/2016   Procedure: A/V SHUNT INTERVENTION;  Surgeon: Renford Dills, MD;  Location: [facility] INVASIVE CV LAB;  Service: Cardiovascular;  Laterality: N/A;   A/V SHUNTOGRAM Left 12/08/2017   Procedure: A/V SHUNTOGRAM;  Surgeon: Renford Dills, MD;  Location: [facility] INVASIVE CV LAB;  Service: Cardiovascular;  Laterality: Left;   A/V SHUNTOGRAM Left 01/28/2018   Procedure: A/V SHUNTOGRAM;  Surgeon: Annice Needy, MD;  Location: [facility] INVASIVE CV LAB;  Service: Cardiovascular;  Laterality: Left;   A/V SHUNTOGRAM Left 04/01/2018   Procedure: A/V SHUNTOGRAM;  Surgeon: Annice Needy, MD;  Location: [facility] INVASIVE CV LAB;  Service: Cardiovascular;  Laterality: Left;   A/V SHUNTOGRAM Left 04/28/2019   Procedure: A/V SHUNTOGRAM;  Surgeon: Annice Needy, MD;  Location: [facility] INVASIVE CV LAB;  Service: Cardiovascular;  Laterality: Left;   AV FISTULA PLACEMENT     AV FISTULA PLACEMENT Left 02/09/2022   Procedure: FISTULAGRAM, placement of left arm stent.;  Surgeon: Nada Libman, MD;  Location: [facility] OR;  Service: Vascular;  Laterality: Left;   DIALYSIS/PERMA CATHETER INSERTION  10/23/2016    Procedure: DIALYSIS/PERMA CATHETER INSERTION;  Surgeon: Annice Needy, MD;  Location: [facility] INVASIVE CV LAB;  Service: Cardiovascular;;   IR REMOVAL TUN CV CATH W/O FL  09/22/2017   IR THROMBECTOMY AV FISTULA W/THROMBOLYSIS INC/SHUNT/IMG LEFT Left 09/04/2017   IR US GUIDE VASC ACCESS LEFT  09/04/2017   MASS EXCISION Left 06/01/2018   Procedure: EXCISION 5 X 3CM LIPOMA LEFT THIGH;  Surgeon: Franky Macho, MD;  Location: [facility];  Service: General;  Laterality: Left;   PERIPHERAL VASCULAR CATHETERIZATION Left 01/04/2015   Procedure: A/V Shuntogram/Fistulagram;  Surgeon: Annice Needy, MD;  Location: [facility] INVASIVE CV LAB;  Service: Cardiovascular;  Laterality: Left;   PERIPHERAL VASCULAR CATHETERIZATION N/A 01/04/2015   Procedure: A/V Shunt Intervention;  Surgeon: Annice Needy, MD;  Location: [facility] INVASIVE CV LAB;  Service: Cardiovascular;  Laterality: N/A;   PERIPHERAL VASCULAR CATHETERIZATION Left 04/13/2015   Procedure: A/V Shuntogram/Fistulagram;  Surgeon: Renford Dills, MD;  Location: [facility] INVASIVE CV LAB;  Service: Cardiovascular;  Laterality: Left;   PERIPHERAL VASCULAR CATHETERIZATION Left 04/13/2015   Procedure: A/V Shunt Intervention;  Surgeon: Renford Dills, MD;  Location: [facility] INVASIVE CV LAB;  Service: Cardiovascular;  Laterality: Left;      Home Medications Prior to Admission medications   Medication Sig Start Date End Date Taking? Authorizing Provider  albuterol (PROVENTIL) (2.5 MG/3ML) 0.083% nebulizer solution Take 2.5 mg by nebulization 4 (four) times daily as needed  for wheezing or shortness of breath.     [provider]  ALPRAZolam Prudy Feeler) 0.25 MG tablet Take 0.25 mg by mouth 2 (two) times daily.    [provider]  ANTACID 500 MG chewable tablet Chew 2 tablets by mouth 2 (two) times daily. 01/20/22   [provider]  aspirin EC 81 MG tablet Take 81 mg by mouth daily.    [provider]  Calcium Carbonate Antacid (TUMS PO) Take 1 tablet by  mouth 2 (two) times daily. 06/13/22   [provider]  clopidogrel (PLAVIX) 75 MG tablet Take 75 mg by mouth daily.    [provider]  docusate sodium (COLACE) 100 MG capsule Take 100 mg by mouth daily.    [provider]  epoetin alfa (EPOGEN,PROCRIT) 4000 UNIT/ML injection Inject 4,000 Units into the vein See admin instructions. Mon, wed, and Friday at dialysis    [provider]  guaiFENesin (ROBITUSSIN) 100 MG/5ML liquid Take 200 mg by mouth every 6 (six) hours as needed for cough.     [provider]  hydroxychloroquine (PLAQUENIL) 200 MG tablet Take 200 mg by mouth daily.    [provider]  ipratropium (ATROVENT) 0.02 % nebulizer solution Take 0.5 mg by nebulization 4 (four) times daily as needed for wheezing or shortness of breath.    [provider]  lidocaine-prilocaine (EMLA) cream Apply 1 application topically as needed (port access).  12/20/12   [provider]  loratadine (CLARITIN) 10 MG tablet Take 10 mg by mouth daily as needed for allergies.    [provider]  midodrine (PROAMATINE) 10 MG tablet Take 10 mg by mouth 2 (two) times daily.     [provider]  mirtazapine (REMERON) 15 MG tablet Take 7.5 mg by mouth at bedtime.  11/03/13   [provider]  multivitamin (RENA-VIT) TABS tablet Take 1 tablet by mouth daily.    [provider]  pantoprazole (PROTONIX) 40 MG tablet Take 40 mg by mouth daily.    [provider]  phenytoin (DILANTIN) 100 MG ER capsule Take 100 mg by mouth 2 (two) times daily.     [provider]  RESTASIS 0.05 % ophthalmic emulsion Place 1 drop into both eyes 2 (two) times daily. 01/31/21   [provider]  sertraline (ZOLOFT) 100 MG tablet Take 200 mg by mouth daily.     [provider]  simvastatin (ZOCOR) 40 MG tablet Take 40 mg by mouth at bedtime.     [provider]      Allergies    Dust mite extract     Review of Systems   Review of Systems  Physical Exam Updated Vital Signs BP 107/71 (BP Location: Right Arm)   Pulse 70   Temp 97.6 F (36.4 C) (Oral)   Resp 16   Ht 5\' 4"  (1.626 m)   Wt 54.9 kg   LMP 06/18/2022 (Approximate)   SpO2 99%   BMI 20.77 kg/m  Physical Exam Vitals reviewed.  HENT:     Head: Normocephalic.  Pulmonary:     Breath sounds: No wheezing.  Abdominal:     Tenderness: There is no abdominal tenderness.  Musculoskeletal:     Comments: Bleeding at proximal aspect of left upper extremity AV graft.  Mild oozing at this time.  Dressing is in place.  Neurological:     Mental Status: She is alert and oriented to person, place, and time.     ED  Results / Procedures / Treatments   Labs (all labs ordered are listed, but only abnormal results are displayed) Labs Reviewed  BASIC METABOLIC PANEL - Abnormal; Notable for the following components:      Result Value   Potassium 2.9 (*)    Chloride 92 (*)    Glucose, Bld 108 (*)    Creatinine, Ser 4.16 (*)    GFR, Estimated 13 (*)    All other components within normal limits  CBC - Abnormal; Notable for the following components:   RDW 16.1 (*)    All other components within normal limits  APTT - Abnormal; Notable for the following components:   aPTT 57 (*)    All other components within normal limits  PROTIME-INR    EKG None  Radiology No results found.  Procedures Procedures    Medications Ordered in ED Medications - No data to display  ED Course/ Medical Decision Making/ A&P                             Medical Decision Making Amount and/or Complexity of Data Reviewed Labs: ordered.   Patient with bleeding dialysis graft.  Has had history of same.  Was dialyzed today.  Mild hypokalemia but hemoglobin reassuring.  Has had mild oozing but has not come out of the dressing yet.  Now has had some oozing out of the top of the dressing.  Redressed with quick clot gauze, some nonadherent  dressing underneath bandage.  Will continue to monitor.       Final Clinical Impression(s) / ED Diagnoses Final diagnoses:  Hemorrhage from arteriovenous dialysis graft    Rx / DC Orders ED Discharge Orders     None         Benjiman Core, MD 07/02/22 1528

## 2022-07-02 NOTE — ED Notes (Signed)
Attempted to call Willaim Rayas w/ Group Home.  No answer and HIPAA compliant VM left.

## 2022-07-02 NOTE — ED Notes (Signed)
Report given to Willaim Rayas at group home. Ms.[name] to come transport pt back to group home, she also states she spoke with pts legal guardian prior to speaking to [employer]

## 2022-07-02 NOTE — ED Notes (Signed)
Attempted to call Clarisa Kindred, Legal Guardian, and call went to voicemail.  VM left regarding care including this writer's callback number.

## 2022-07-02 NOTE — Discharge Instructions (Signed)
Please keep the dressing on all through the night.  If you start having oozing or bleeding again, please apply pressure over the dressing for 10 or 15 minutes.  If you are still oozing, then please come to the emergency room.

## 2022-07-02 NOTE — ED Notes (Signed)
Attempted to call Willaim Rayas w/ Pt's Group Home.  No answer, but HIPAA compliant message left.   Willaim Rayas [phone]

## 2022-07-02 NOTE — ED Notes (Signed)
Pt has bled through dressing.  Fistula redressed by this writer and EDP.  Quick Clot, Telfa, gauze, and ace wrap applied.

## 2022-07-02 NOTE — ED Notes (Signed)
Pt noted to have bled through Quick Clot and gauze, but she has not bled through [place].  EDP made aware.

## 2022-07-02 NOTE — ED Notes (Signed)
Attempted to call Willaim Rayas w/ Group Home w/o answer.

## 2022-07-02 NOTE — ED Notes (Addendum)
This Clinical research associate spoke to [employer], who was listed as Armed forces operational officer Guardian in the chart.  Ms. Tenny Craw reports she is no longer the Pt's Legal Guardian and all calls should be direct to [employer].  She is the new Legal Guardian.

## 2022-07-02 NOTE — ED Provider Notes (Addendum)
  Physical Exam  BP 107/71 (BP Location: Right Arm)   Pulse 70   Temp 97.6 F (36.4 C) (Oral)   Resp 16   Ht  (1.626 m)   Wt 54.9 kg   LMP 06/18/2022 (Approximate)   SpO2 99%   BMI 20.77 kg/m   Physical Exam  Procedures  Procedures  ED Course / MDM    Medical Decision Making Amount and/or Complexity of Data Reviewed Labs: ordered.   Pt comes in with cc of bleeding from her fistula. She had HD today. Oozing only. Has a quick clot bandage, will need reassessment in a few minutes. If still oozing will repair.    Derwood Kaplan, MD 07/02/22 1546  5:13 PM Patient continues to have no bleeding.  She is feeling a lot comfortable, as the initial dressing was too tight for her.  We have loosened up the dressing, still no bleeding.  Stable for discharge with return precautions.    Derwood Kaplan, MD 07/02/22 [phone]

## 2022-07-02 NOTE — ED Triage Notes (Signed)
Per EMS, Pt, from [place] #2, presents w/ dialysis fistula bleeding.  Fistula actively bleeding upon arrival.  Pressure dressing w/ Quick Clot applied by ED staff.   Pt reports having dialysis this morning.

## 2022-07-02 NOTE — ED Notes (Signed)
Pt called out c/o pain in L arm.  Arm noted to be discolored and cool to touch.  Ace bandage loosened.

## 2022-07-02 NOTE — ED Notes (Signed)
Pt reports arm is feeling much better.  It is warm to the touch.  Dressing is still clean and dry.

## 2022-07-03 ENCOUNTER — Telehealth: Payer: Self-pay | Admitting: *Deleted

## 2022-07-03 ENCOUNTER — Other Ambulatory Visit (HOSPITAL_COMMUNITY): Payer: Self-pay | Admitting: Internal Medicine

## 2022-07-03 DIAGNOSIS — Z1231 Encounter for screening mammogram for malignant neoplasm of breast: Secondary | ICD-10-CM

## 2022-07-03 LAB — CYTOLOGY - PAP
Adequacy: ABSENT
Comment: NEGATIVE
Diagnosis: NEGATIVE
High risk HPV: NEGATIVE

## 2022-07-03 NOTE — Telephone Encounter (Signed)
-----   Message from Adline Potter, NP sent at 07/03/2022  9:13 AM EDT ----- Let pt know negative HPV and malignancy on pap. THX

## 2022-07-03 NOTE — Telephone Encounter (Signed)
Left message @ 10:40 am letting pt know pap was negative for HPV and malignancy. JSY

## 2022-07-15 ENCOUNTER — Ambulatory Visit: Admit: 2022-07-15 | Payer: Medicare Other | Admitting: Vascular Surgery

## 2022-07-15 SURGERY — REMOVAL, DIALYSIS CATHETER
Anesthesia: Choice | Laterality: Right

## 2022-07-23 ENCOUNTER — Ambulatory Visit: Payer: Medicare Other | Admitting: Vascular Surgery

## 2022-08-13 ENCOUNTER — Ambulatory Visit (INDEPENDENT_AMBULATORY_CARE_PROVIDER_SITE_OTHER): Payer: Medicare Other | Admitting: Vascular Surgery

## 2022-08-13 ENCOUNTER — Encounter: Payer: Self-pay | Admitting: Vascular Surgery

## 2022-08-13 VITALS — BP 98/65 | HR 81 | Temp 97.1°F | Ht 64.0 in | Wt 120.8 lb

## 2022-08-13 DIAGNOSIS — N186 End stage renal disease: Secondary | ICD-10-CM

## 2022-08-13 DIAGNOSIS — Z992 Dependence on renal dialysis: Secondary | ICD-10-CM | POA: Diagnosis not present

## 2022-08-13 NOTE — H&P (View-Only) (Signed)
  Vascular and Vein Specialist of [place]  Patient name: [name] MRN: [id number] DOB: 03/17/1976 Sex: female  REASON FOR VISIT: Discuss options for hemodialysis access  HPI: [name] is a 46 y.o. female here today for discussion of new options for hemodialysis access.  I saw her approximately 6 months ago with prolonged bleeding from a left upper arm loop hero graft.  This has been present for nearly 10 years with multiple revisions.  She underwent shuntogram and angioplasty and stenting of her proximal vein with Dr. Brabham in [place].  She now has had thromboses of her graft.  She had placement of a right femoral tunneled catheter in [place] which she is currently having dialysis on Monday Wednesday and Friday.  Past Medical History:  Diagnosis Date   Anemia    BV (bacterial vaginosis) 11/08/2013   Depression    Dialysis patient (HCC)    DVT (deep venous thrombosis) (HCC)    Hypertension    Lupus (HCC)    Renal disorder    Vaginal odor 11/08/2013    Family History  Problem Relation Age of Onset   Seizures Son    Kidney disease Mother        on dialysis   Lupus Mother     SOCIAL HISTORY: Social History   Tobacco Use   Smoking status: Every Day    Packs/day: 0.10    Years: 15.00    Additional pack years: 0.00    Total pack years: 1.50    Types: Cigarettes    Passive exposure: Current   Smokeless tobacco: Never  Substance Use Topics   Alcohol use: No    Allergies  Allergen Reactions   Dust Mite Extract Other (See Comments)    sneezing    Current Outpatient Medications  Medication Sig Dispense Refill   albuterol (PROVENTIL) (2.5 MG/3ML) 0.083% nebulizer solution Take 2.5 mg by nebulization 4 (four) times daily as needed for wheezing or shortness of breath.      ALPRAZolam (XANAX) 0.25 MG tablet Take 0.25 mg by mouth 2 (two) times daily.     ANTACID 500 MG chewable tablet Chew 2 tablets by mouth 2 (two)  times daily.     aspirin EC 81 MG tablet Take 81 mg by mouth daily.     Calcium Carbonate Antacid (TUMS PO) Take 1 tablet by mouth 2 (two) times daily.     clopidogrel (PLAVIX) 75 MG tablet Take 75 mg by mouth daily.     docusate sodium (COLACE) 100 MG capsule Take 100 mg by mouth daily.     epoetin alfa (EPOGEN,PROCRIT) 4000 UNIT/ML injection Inject 4,000 Units into the vein See admin instructions. Mon, wed, and Friday at dialysis     guaiFENesin (ROBITUSSIN) 100 MG/5ML liquid Take 200 mg by mouth every 6 (six) hours as needed for cough.      hydroxychloroquine (PLAQUENIL) 200 MG tablet Take 200 mg by mouth daily.     ipratropium (ATROVENT) 0.02 % nebulizer solution Take 0.5 mg by nebulization 4 (four) times daily as needed for wheezing or shortness of breath.     lidocaine-prilocaine (EMLA) cream Apply 1 application topically as needed (port access).      loratadine (CLARITIN) 10 MG tablet Take 10 mg by mouth daily as needed for allergies.     midodrine (PROAMATINE) 10 MG tablet Take 10 mg by mouth 2 (two) times daily.      mirtazapine (REMERON) 15 MG tablet Take 7.5 mg by mouth at bedtime.        multivitamin (RENA-VIT) TABS tablet Take 1 tablet by mouth daily.     pantoprazole (PROTONIX) 40 MG tablet Take 40 mg by mouth daily.     phenytoin (DILANTIN) 100 MG ER capsule Take 100 mg by mouth 2 (two) times daily.      RESTASIS 0.05 % ophthalmic emulsion Place 1 drop into both eyes 2 (two) times daily.     sertraline (ZOLOFT) 100 MG tablet Take 200 mg by mouth daily.      simvastatin (ZOCOR) 40 MG tablet Take 40 mg by mouth at bedtime.      No current facility-administered medications for this visit.    REVIEW OF SYSTEMS:  [X] denotes positive finding, [ ] denotes negative finding Cardiac  Comments:  Chest pain or chest pressure:    Shortness of breath upon exertion:    Short of breath when lying flat:    Irregular heart rhythm:        Vascular    Pain in calf, thigh, or hip brought on  by ambulation:    Pain in feet at night that wakes you up from your sleep:     Blood clot in your veins:    Leg swelling:           PHYSICAL EXAM: Vitals:   08/13/22 1502  BP: 98/65  Pulse: 81  Temp: (!) 97.1 F (36.2 C)  SpO2: 96%  Weight: 120 lb 12.8 oz (54.8 kg)  Height: 5' 4" (1.626 m)    GENERAL: The patient is a well-nourished female, in no acute distress. The vital signs are documented above. CARDIOVASCULAR: Thrombosed left upper arm graft.  She does have a old thrombosed right arm graft.  No significant swelling in her right arm PULMONARY: There is good air exchange  MUSCULOSKELETAL: There are no major deformities or cyanosis. NEUROLOGIC: No focal weakness or paresthesias are detected. SKIN: There are no ulcers or rashes noted. PSYCHIATRIC: The patient has a normal affect.  DATA:  I did image her right arm with SonoSite ultrasound.  This does reveal her thrombosed prosthetic graft.  She has very small surface veins on the right.  MEDICAL ISSUES: I discussed options with the patient and her mother present.  I reviewed her old records and cannot find any documentation regarding the status of her central veins on the right.  I have recommended a right arm venogram to determine if she is a candidate for either redo right upper arm graft or even consideration for a hero graft.  He does have the central venous component of her hero graft on the left still in place and will need consideration of removal of this as well.  Our [place] office will coordinate her venogram a Tuesday or Thursday    Alvah Gilder F. Azan Maneri, MD FACS [facility] of [place] Office Tel [phone]  Note: Portions of this report may have been transcribed using voice recognition software.  Every effort has been made to ensure accuracy; however, inadvertent computerized transcription errors may still be present. 

## 2022-08-13 NOTE — Progress Notes (Signed)
Vascular and Vein Specialist of [place]  Patient name: [name] MRN: [id number] DOB: 1975-09-24 Sex: female  REASON FOR VISIT: Discuss options for hemodialysis access  HPI: [name] is a 47 y.o. female here today for discussion of new options for hemodialysis access.  I saw her approximately 6 months ago with prolonged bleeding from a left upper arm loop hero graft.  This has been present for nearly 10 years with multiple revisions.  She underwent shuntogram and angioplasty and stenting of her proximal vein with Dr. Myra Gianotti in [place].  She now has had thromboses of her graft.  She had placement of a right femoral tunneled catheter in [place] which she is currently having dialysis on Monday Wednesday and Friday.  Past Medical History:  Diagnosis Date   Anemia    BV (bacterial vaginosis) 11/08/2013   Depression    Dialysis patient [facility])    DVT (deep venous thrombosis) (HCC)    Hypertension    Lupus (HCC)    Renal disorder    Vaginal odor 11/08/2013    Family History  Problem Relation Age of Onset   Seizures Son    Kidney disease Mother        on dialysis   Lupus Mother     SOCIAL HISTORY: Social History   Tobacco Use   Smoking status: Every Day    Packs/day: 0.10    Years: 15.00    Additional pack years: 0.00    Total pack years: 1.50    Types: Cigarettes    Passive exposure: Current   Smokeless tobacco: Never  Substance Use Topics   Alcohol use: No    Allergies  Allergen Reactions   Dust Mite Extract Other (See Comments)    sneezing    Current Outpatient Medications  Medication Sig Dispense Refill   albuterol (PROVENTIL) (2.5 MG/3ML) 0.083% nebulizer solution Take 2.5 mg by nebulization 4 (four) times daily as needed for wheezing or shortness of breath.      ALPRAZolam (XANAX) 0.25 MG tablet Take 0.25 mg by mouth 2 (two) times daily.     ANTACID 500 MG chewable tablet Chew 2 tablets by mouth 2 (two)  times daily.     aspirin EC 81 MG tablet Take 81 mg by mouth daily.     Calcium Carbonate Antacid (TUMS PO) Take 1 tablet by mouth 2 (two) times daily.     clopidogrel (PLAVIX) 75 MG tablet Take 75 mg by mouth daily.     docusate sodium (COLACE) 100 MG capsule Take 100 mg by mouth daily.     epoetin alfa (EPOGEN,PROCRIT) 4000 UNIT/ML injection Inject 4,000 Units into the vein See admin instructions. Mon, wed, and Friday at dialysis     guaiFENesin (ROBITUSSIN) 100 MG/5ML liquid Take 200 mg by mouth every 6 (six) hours as needed for cough.      hydroxychloroquine (PLAQUENIL) 200 MG tablet Take 200 mg by mouth daily.     ipratropium (ATROVENT) 0.02 % nebulizer solution Take 0.5 mg by nebulization 4 (four) times daily as needed for wheezing or shortness of breath.     lidocaine-prilocaine (EMLA) cream Apply 1 application topically as needed (port access).      loratadine (CLARITIN) 10 MG tablet Take 10 mg by mouth daily as needed for allergies.     midodrine (PROAMATINE) 10 MG tablet Take 10 mg by mouth 2 (two) times daily.      mirtazapine (REMERON) 15 MG tablet Take 7.5 mg by mouth at bedtime.  multivitamin (RENA-VIT) TABS tablet Take 1 tablet by mouth daily.     pantoprazole (PROTONIX) 40 MG tablet Take 40 mg by mouth daily.     phenytoin (DILANTIN) 100 MG ER capsule Take 100 mg by mouth 2 (two) times daily.      RESTASIS 0.05 % ophthalmic emulsion Place 1 drop into both eyes 2 (two) times daily.     sertraline (ZOLOFT) 100 MG tablet Take 200 mg by mouth daily.      simvastatin (ZOCOR) 40 MG tablet Take 40 mg by mouth at bedtime.      No current facility-administered medications for this visit.    REVIEW OF SYSTEMS:  [X]  denotes positive finding, [ ]  denotes negative finding Cardiac  Comments:  Chest pain or chest pressure:    Shortness of breath upon exertion:    Short of breath when lying flat:    Irregular heart rhythm:        Vascular    Pain in calf, thigh, or hip brought on  by ambulation:    Pain in feet at night that wakes you up from your sleep:     Blood clot in your veins:    Leg swelling:           PHYSICAL EXAM: Vitals:   08/13/22 1502  BP: 98/65  Pulse: 81  Temp: (!) 97.1 F (36.2 C)  SpO2: 96%  Weight: 120 lb 12.8 oz (54.8 kg)  Height: 5\' 4"  (1.626 m)    GENERAL: The patient is a well-nourished female, in no acute distress. The vital signs are documented above. CARDIOVASCULAR: Thrombosed left upper arm graft.  She does have a old thrombosed right arm graft.  No significant swelling in her right arm PULMONARY: There is good air exchange  MUSCULOSKELETAL: There are no major deformities or cyanosis. NEUROLOGIC: No focal weakness or paresthesias are detected. SKIN: There are no ulcers or rashes noted. PSYCHIATRIC: The patient has a normal affect.  DATA:  I did image her right arm with SonoSite ultrasound.  This does reveal her thrombosed prosthetic graft.  She has very small surface veins on the right.  MEDICAL ISSUES: I discussed options with the patient and her mother present.  I reviewed her old records and cannot find any documentation regarding the status of her central veins on the right.  I have recommended a right arm venogram to determine if she is a candidate for either redo right upper arm graft or even consideration for a hero graft.  He does have the central venous component of her hero graft on the left still in place and will need consideration of removal of this as well.  Our [facility] office will coordinate her venogram a Tuesday or Thursday    Larina Earthly, MD [facility] Vascular and Vein Specialists of [facility] Tel [phone]  Note: Portions of this report may have been transcribed using voice recognition software.  Every effort has been made to ensure accuracy; however, inadvertent computerized transcription errors may still be present.

## 2022-08-13 NOTE — H&P (View-Only) (Signed)
  Vascular and Vein Specialist of [place]  Patient name: [name] MRN: [id number] DOB: 02/15/1976 Sex: female  REASON FOR VISIT: Discuss options for hemodialysis access  HPI: [name] is a 46 y.o. female here today for discussion of new options for hemodialysis access.  I saw her approximately 6 months ago with prolonged bleeding from a left upper arm loop hero graft.  This has been present for nearly 10 years with multiple revisions.  She underwent shuntogram and angioplasty and stenting of her proximal vein with Dr. Brabham in [place].  She now has had thromboses of her graft.  She had placement of a right femoral tunneled catheter in [place] which she is currently having dialysis on Monday Wednesday and Friday.  Past Medical History:  Diagnosis Date   Anemia    BV (bacterial vaginosis) 11/08/2013   Depression    Dialysis patient (HCC)    DVT (deep venous thrombosis) (HCC)    Hypertension    Lupus (HCC)    Renal disorder    Vaginal odor 11/08/2013    Family History  Problem Relation Age of Onset   Seizures Son    Kidney disease Mother        on dialysis   Lupus Mother     SOCIAL HISTORY: Social History   Tobacco Use   Smoking status: Every Day    Packs/day: 0.10    Years: 15.00    Additional pack years: 0.00    Total pack years: 1.50    Types: Cigarettes    Passive exposure: Current   Smokeless tobacco: Never  Substance Use Topics   Alcohol use: No    Allergies  Allergen Reactions   Dust Mite Extract Other (See Comments)    sneezing    Current Outpatient Medications  Medication Sig Dispense Refill   albuterol (PROVENTIL) (2.5 MG/3ML) 0.083% nebulizer solution Take 2.5 mg by nebulization 4 (four) times daily as needed for wheezing or shortness of breath.      ALPRAZolam (XANAX) 0.25 MG tablet Take 0.25 mg by mouth 2 (two) times daily.     ANTACID 500 MG chewable tablet Chew 2 tablets by mouth 2 (two)  times daily.     aspirin EC 81 MG tablet Take 81 mg by mouth daily.     Calcium Carbonate Antacid (TUMS PO) Take 1 tablet by mouth 2 (two) times daily.     clopidogrel (PLAVIX) 75 MG tablet Take 75 mg by mouth daily.     docusate sodium (COLACE) 100 MG capsule Take 100 mg by mouth daily.     epoetin alfa (EPOGEN,PROCRIT) 4000 UNIT/ML injection Inject 4,000 Units into the vein See admin instructions. Mon, wed, and Friday at dialysis     guaiFENesin (ROBITUSSIN) 100 MG/5ML liquid Take 200 mg by mouth every 6 (six) hours as needed for cough.      hydroxychloroquine (PLAQUENIL) 200 MG tablet Take 200 mg by mouth daily.     ipratropium (ATROVENT) 0.02 % nebulizer solution Take 0.5 mg by nebulization 4 (four) times daily as needed for wheezing or shortness of breath.     lidocaine-prilocaine (EMLA) cream Apply 1 application topically as needed (port access).      loratadine (CLARITIN) 10 MG tablet Take 10 mg by mouth daily as needed for allergies.     midodrine (PROAMATINE) 10 MG tablet Take 10 mg by mouth 2 (two) times daily.      mirtazapine (REMERON) 15 MG tablet Take 7.5 mg by mouth at bedtime.        multivitamin (RENA-VIT) TABS tablet Take 1 tablet by mouth daily.     pantoprazole (PROTONIX) 40 MG tablet Take 40 mg by mouth daily.     phenytoin (DILANTIN) 100 MG ER capsule Take 100 mg by mouth 2 (two) times daily.      RESTASIS 0.05 % ophthalmic emulsion Place 1 drop into both eyes 2 (two) times daily.     sertraline (ZOLOFT) 100 MG tablet Take 200 mg by mouth daily.      simvastatin (ZOCOR) 40 MG tablet Take 40 mg by mouth at bedtime.      No current facility-administered medications for this visit.    REVIEW OF SYSTEMS:  [X] denotes positive finding, [ ] denotes negative finding Cardiac  Comments:  Chest pain or chest pressure:    Shortness of breath upon exertion:    Short of breath when lying flat:    Irregular heart rhythm:        Vascular    Pain in calf, thigh, or hip brought on  by ambulation:    Pain in feet at night that wakes you up from your sleep:     Blood clot in your veins:    Leg swelling:           PHYSICAL EXAM: Vitals:   08/13/22 1502  BP: 98/65  Pulse: 81  Temp: (!) 97.1 F (36.2 C)  SpO2: 96%  Weight: 120 lb 12.8 oz (54.8 kg)  Height: 5' 4" (1.626 m)    GENERAL: The patient is a well-nourished female, in no acute distress. The vital signs are documented above. CARDIOVASCULAR: Thrombosed left upper arm graft.  She does have a old thrombosed right arm graft.  No significant swelling in her right arm PULMONARY: There is good air exchange  MUSCULOSKELETAL: There are no major deformities or cyanosis. NEUROLOGIC: No focal weakness or paresthesias are detected. SKIN: There are no ulcers or rashes noted. PSYCHIATRIC: The patient has a normal affect.  DATA:  I did image her right arm with SonoSite ultrasound.  This does reveal her thrombosed prosthetic graft.  She has very small surface veins on the right.  MEDICAL ISSUES: I discussed options with the patient and her mother present.  I reviewed her old records and cannot find any documentation regarding the status of her central veins on the right.  I have recommended a right arm venogram to determine if she is a candidate for either redo right upper arm graft or even consideration for a hero graft.  He does have the central venous component of her hero graft on the left still in place and will need consideration of removal of this as well.  Our [place] office will coordinate her venogram a Tuesday or Thursday    [name], MD FACS [facility] of [place] Office Tel [phone]  Note: Portions of this report may have been transcribed using voice recognition software.  Every effort has been made to ensure accuracy; however, inadvertent computerized transcription errors may still be present. 

## 2022-08-18 ENCOUNTER — Other Ambulatory Visit: Payer: Self-pay

## 2022-08-18 DIAGNOSIS — N186 End stage renal disease: Secondary | ICD-10-CM

## 2022-08-18 MED ORDER — SODIUM CHLORIDE 0.9% FLUSH: 3.0000 mL | Freq: Two times a day (BID) | INTRAVENOUS | Status: DC

## 2022-08-18 MED ORDER — SODIUM CHLORIDE 0.9 % IV SOLN
250.0000 mL | INTRAVENOUS | Status: DC | PRN
Start: 2022-08-18 — End: 2023-12-10

## 2022-08-26 ENCOUNTER — Ambulatory Visit (HOSPITAL_COMMUNITY)
Admission: RE | Admit: 2022-08-26 | Discharge: 2022-08-26 | Disposition: A | Discharge status: Home / Self Care | Payer: Medicare Other | Attending: Surgery | Admitting: Surgery

## 2022-08-26 ENCOUNTER — Other Ambulatory Visit: Payer: Self-pay

## 2022-08-26 ENCOUNTER — Encounter (HOSPITAL_COMMUNITY)
Admission: RE | Disposition: A | Discharge status: Home / Self Care | Payer: Self-pay | Source: Home / Self Care | Attending: Surgery

## 2022-08-26 DIAGNOSIS — N186 End stage renal disease: Secondary | ICD-10-CM | POA: Insufficient documentation

## 2022-08-26 DIAGNOSIS — I120 Hypertensive chronic kidney disease with stage 5 chronic kidney disease or end stage renal disease: Secondary | ICD-10-CM | POA: Insufficient documentation

## 2022-08-26 DIAGNOSIS — Z992 Dependence on renal dialysis: Secondary | ICD-10-CM | POA: Insufficient documentation

## 2022-08-26 DIAGNOSIS — N185 Chronic kidney disease, stage 5: Secondary | ICD-10-CM

## 2022-08-26 HISTORY — PX: UPPER EXTREMITY VENOGRAPHY: CATH118272

## 2022-08-26 LAB — POCT I-STAT, CHEM 8
BUN: 38 mg/dL — ABNORMAL HIGH (ref 6–20)
Calcium, Ion: 1.03 mmol/L — ABNORMAL LOW (ref 1.15–1.40)
Chloride: 95 mmol/L — ABNORMAL LOW (ref 98–111)
Creatinine, Ser: 6.8 mg/dL — ABNORMAL HIGH (ref 0.44–1.00)
Glucose, Bld: 72 mg/dL (ref 70–99)
HCT: 43 % (ref 36.0–46.0)
Hemoglobin: 14.6 g/dL (ref 12.0–15.0)
Potassium: 3.6 mmol/L (ref 3.5–5.1)
Sodium: 135 mmol/L (ref 135–145)
TCO2: 29 mmol/L (ref 22–32)

## 2022-08-26 LAB — HCG, SERUM, QUALITATIVE: Preg, Serum: NEGATIVE

## 2022-08-26 SURGERY — UPPER EXTREMITY VENOGRAPHY
Anesthesia: LOCAL

## 2022-08-26 MED ORDER — HEPARIN (PORCINE) IN NACL 1000-0.9 UT/500ML-% IV SOLN
INTRAVENOUS | Status: DC | PRN
  Administered 2022-08-26: 500 mL

## 2022-08-26 MED ORDER — SODIUM CHLORIDE 0.9% FLUSH: 3.0000 mL | INTRAVENOUS | Status: DC | PRN

## 2022-08-26 MED ORDER — IODIXANOL 320 MG/ML IV SOLN
INTRAVENOUS | Status: DC | PRN
  Administered 2022-08-26: 20 mL

## 2022-08-26 SURGICAL SUPPLY — 2 items
PROTECTION STATION PRESSURIZED (MISCELLANEOUS) ×1
STATION PROTECTION PRESSURIZED (MISCELLANEOUS) IMPLANT

## 2022-08-26 NOTE — Op Note (Signed)
    Patient name: [name] MRN: [id number] DOB: 01-27-1976 Sex: female  08/26/2022 Pre-operative Diagnosis: ESRD Post-operative diagnosis:  Same Surgeon:  Durene Cal Procedure Performed:  1.  Right arm and central venogram   Indications: The patient was accepted To today for venogram  Procedure:  The patient was identified in the holding area and taken to room 8.  The patient was then placed supine on the table and prepped and draped in the usual sterile fashion.  A time out was called.  Contrast injections were performed through the IV placed in the holding area   Findings: The central venous system of the patent without significant stenosis.  The right subclavian and brachiocephalic veins are small in caliber but patent without stenosis.  The axillary vein appears to be patent there is a small luminal irregularity as the vein crosses the bone edge of the axilla this does not appear to be significant.  Catheter tip from the left side is noted junction    Impression:  #1  Patent central venous system.  #2  The patient has never had right arm access for dialysis.  She would be a candidate for right upper arm graft.  This will be scheduled in the near future   V. Durene Cal, M.D., [facility] V[facility] and Vein Specialists of [place] Office: [phone] Pager:  [phone]

## 2022-08-26 NOTE — Interval H&P Note (Signed)
History and Physical Interval Note:  08/26/2022 12:09 PM  [name]  has presented today for surgery, with the diagnosis of end stage renal.  The various methods of treatment have been discussed with the patient and family. After consideration of risks, benefits and other options for treatment, the patient has consented to  Procedure(s): UPPER EXTREMITY VENOGRAPHY (N/A) as a surgical intervention.  The patient's history has been reviewed, patient examined, no change in status, stable for surgery.  I have reviewed the patient's chart and labs.  Questions were answered to the patient's satisfaction.     Durene Cal

## 2022-08-27 ENCOUNTER — Encounter (HOSPITAL_COMMUNITY): Payer: Self-pay | Admitting: Surgery

## 2022-08-29 ENCOUNTER — Encounter (HOSPITAL_COMMUNITY)
Admission: RE | Admit: 2022-08-29 | Discharge: 2022-08-29 | Disposition: A | Discharge status: Home / Self Care | Payer: Medicare Other | Source: Ambulatory Visit | Attending: Vascular Surgery | Admitting: Vascular Surgery

## 2022-08-29 ENCOUNTER — Encounter (HOSPITAL_COMMUNITY): Payer: Self-pay

## 2022-08-29 ENCOUNTER — Other Ambulatory Visit: Payer: Self-pay

## 2022-08-29 DIAGNOSIS — N186 End stage renal disease: Secondary | ICD-10-CM

## 2022-08-29 DIAGNOSIS — Z01818 Encounter for other preprocedural examination: Secondary | ICD-10-CM

## 2022-08-29 NOTE — Pre-Procedure Instructions (Signed)
Spoke with Clarisa Kindred from [employer]. She gave telephone consent for patient's procedure. She will fax her guardianship papers over to [place]. I faxed copy signed verbal consent to her for patients record. I also call [facility] and spoke with Michela Pitcher, staff member and went over all of pre-op instructions with her. Michela Pitcher confirms that Mauriana"s last dose of plavix was taken on 08/27/2022.

## 2022-09-02 ENCOUNTER — Encounter (HOSPITAL_COMMUNITY): Payer: Self-pay | Admitting: Vascular Surgery

## 2022-09-02 ENCOUNTER — Ambulatory Visit (HOSPITAL_BASED_OUTPATIENT_CLINIC_OR_DEPARTMENT_OTHER): Payer: Medicare Other | Admitting: Anesthesiology

## 2022-09-02 ENCOUNTER — Other Ambulatory Visit: Payer: Self-pay

## 2022-09-02 ENCOUNTER — Ambulatory Visit (HOSPITAL_COMMUNITY): Payer: Medicare Other | Admitting: Anesthesiology

## 2022-09-02 ENCOUNTER — Encounter (HOSPITAL_COMMUNITY)
Admission: RE | Disposition: A | Discharge status: Home / Self Care | Payer: Self-pay | Source: Home / Self Care | Attending: Vascular Surgery

## 2022-09-02 ENCOUNTER — Ambulatory Visit (HOSPITAL_COMMUNITY)
Admission: RE | Admit: 2022-09-02 | Discharge: 2022-09-02 | Disposition: A | Discharge status: Home / Self Care | Payer: Medicare Other | Attending: Vascular Surgery | Admitting: Vascular Surgery

## 2022-09-02 DIAGNOSIS — D631 Anemia in chronic kidney disease: Secondary | ICD-10-CM

## 2022-09-02 DIAGNOSIS — F17210 Nicotine dependence, cigarettes, uncomplicated: Secondary | ICD-10-CM | POA: Insufficient documentation

## 2022-09-02 DIAGNOSIS — Z992 Dependence on renal dialysis: Secondary | ICD-10-CM | POA: Insufficient documentation

## 2022-09-02 DIAGNOSIS — Z01818 Encounter for other preprocedural examination: Secondary | ICD-10-CM | POA: Insufficient documentation

## 2022-09-02 DIAGNOSIS — N186 End stage renal disease: Secondary | ICD-10-CM | POA: Diagnosis not present

## 2022-09-02 DIAGNOSIS — E1122 Type 2 diabetes mellitus with diabetic chronic kidney disease: Secondary | ICD-10-CM

## 2022-09-02 DIAGNOSIS — I120 Hypertensive chronic kidney disease with stage 5 chronic kidney disease or end stage renal disease: Secondary | ICD-10-CM | POA: Diagnosis not present

## 2022-09-02 DIAGNOSIS — I878 Other specified disorders of veins: Secondary | ICD-10-CM | POA: Diagnosis not present

## 2022-09-02 DIAGNOSIS — Z841 Family history of disorders of kidney and ureter: Secondary | ICD-10-CM | POA: Diagnosis not present

## 2022-09-02 DIAGNOSIS — N185 Chronic kidney disease, stage 5: Secondary | ICD-10-CM

## 2022-09-02 HISTORY — PX: AV FISTULA PLACEMENT: SHX1204

## 2022-09-02 LAB — BASIC METABOLIC PANEL
Anion gap: 16 — ABNORMAL HIGH (ref 5–15)
BUN: 45 mg/dL — ABNORMAL HIGH (ref 6–20)
CO2: 24 mmol/L (ref 22–32)
Calcium: 9.1 mg/dL (ref 8.9–10.3)
Chloride: 94 mmol/L — ABNORMAL LOW (ref 98–111)
Creatinine, Ser: 6.73 mg/dL — ABNORMAL HIGH (ref 0.44–1.00)
GFR, Estimated: 7 mL/min — ABNORMAL LOW (ref >60)
Glucose, Bld: 75 mg/dL (ref 70–99)
Potassium: 4 mmol/L (ref 3.5–5.1)
Sodium: 134 mmol/L — ABNORMAL LOW (ref 135–145)

## 2022-09-02 LAB — HEMOGLOBIN AND HEMATOCRIT, BLOOD
HCT: 37.8 % (ref 36.0–46.0)
Hemoglobin: 12.7 g/dL (ref 12.0–15.0)

## 2022-09-02 LAB — HCG, QUANTITATIVE, PREGNANCY: hCG, Beta Chain, Quant, S: 6 m[IU]/mL — ABNORMAL HIGH (ref <5)

## 2022-09-02 SURGERY — INSERTION OF ARTERIOVENOUS (AV) GORE-TEX GRAFT ARM
Anesthesia: General | Site: Arm Upper | Laterality: Right

## 2022-09-02 MED ORDER — LIDOCAINE 2% (20 MG/ML) 5 ML SYRINGE
INTRAMUSCULAR | Status: DC | PRN
  Administered 2022-09-02: 80 mg via INTRAVENOUS

## 2022-09-02 MED ORDER — CEFAZOLIN SODIUM-DEXTROSE 2-4 GM/100ML-% IV SOLN
2.0000 g | INTRAVENOUS | Status: AC
  Administered 2022-09-02: 2 g via INTRAVENOUS

## 2022-09-02 MED ORDER — SODIUM CHLORIDE 0.9 % IV SOLN: INTRAVENOUS | Status: DC

## 2022-09-02 MED ORDER — CHLORHEXIDINE GLUCONATE 4 % EX SOLN: 60.0000 mL | Freq: Once | CUTANEOUS | Status: DC

## 2022-09-02 MED ORDER — MIDAZOLAM HCL 2 MG/2ML IJ SOLN
INTRAMUSCULAR | Status: DC | PRN
  Administered 2022-09-02 (×2): 1 mg via INTRAVENOUS

## 2022-09-02 MED ORDER — PHENYLEPHRINE HCL-NACL 20-0.9 MG/250ML-% IV SOLN
INTRAVENOUS | Status: DC | PRN
  Administered 2022-09-02: 30 ug/min via INTRAVENOUS

## 2022-09-02 MED ORDER — ONDANSETRON HCL 4 MG/2ML IJ SOLN
INTRAMUSCULAR | Status: AC
  Filled 2022-09-02: qty 2

## 2022-09-02 MED ORDER — FENTANYL CITRATE (PF) 100 MCG/2ML IJ SOLN
INTRAMUSCULAR | Status: DC | PRN
  Administered 2022-09-02 (×4): 25 ug via INTRAVENOUS

## 2022-09-02 MED ORDER — DEXAMETHASONE SODIUM PHOSPHATE 4 MG/ML IJ SOLN
INTRAMUSCULAR | Status: DC | PRN
  Administered 2022-09-02: 5 mg via INTRAVENOUS

## 2022-09-02 MED ORDER — MIDAZOLAM HCL 2 MG/2ML IJ SOLN
INTRAMUSCULAR | Status: AC
  Filled 2022-09-02: qty 2

## 2022-09-02 MED ORDER — PHENYLEPHRINE HCL-NACL 20-0.9 MG/250ML-% IV SOLN
INTRAVENOUS | Status: AC
  Filled 2022-09-02: qty 250

## 2022-09-02 MED ORDER — PROPOFOL 10 MG/ML IV BOLUS
INTRAVENOUS | Status: AC
  Filled 2022-09-02: qty 20

## 2022-09-02 MED ORDER — LIDOCAINE HCL (PF) 2 % IJ SOLN
INTRAMUSCULAR | Status: AC
  Filled 2022-09-02: qty 5

## 2022-09-02 MED ORDER — ONDANSETRON HCL 4 MG/2ML IJ SOLN: 4.0000 mg | Freq: Once | INTRAMUSCULAR | Status: DC | PRN

## 2022-09-02 MED ORDER — EPHEDRINE SULFATE-NACL 50-0.9 MG/10ML-% IV SOSY
PREFILLED_SYRINGE | INTRAVENOUS | Status: DC | PRN
  Administered 2022-09-02: 5 mg via INTRAVENOUS

## 2022-09-02 MED ORDER — CEFAZOLIN SODIUM-DEXTROSE 2-4 GM/100ML-% IV SOLN
INTRAVENOUS | Status: AC
  Filled 2022-09-02: qty 100

## 2022-09-02 MED ORDER — 0.9 % SODIUM CHLORIDE (POUR BTL) OPTIME
TOPICAL | Status: DC | PRN
  Administered 2022-09-02: 1000 mL

## 2022-09-02 MED ORDER — DEXMEDETOMIDINE HCL IN NACL 80 MCG/20ML IV SOLN
INTRAVENOUS | Status: DC | PRN
  Administered 2022-09-02: 8 ug via INTRAVENOUS

## 2022-09-02 MED ORDER — PROPOFOL 10 MG/ML IV BOLUS
INTRAVENOUS | Status: DC | PRN
  Administered 2022-09-02: 120 mg via INTRAVENOUS

## 2022-09-02 MED ORDER — PHENYLEPHRINE 80 MCG/ML (10ML) SYRINGE FOR IV PUSH (FOR BLOOD PRESSURE SUPPORT)
PREFILLED_SYRINGE | INTRAVENOUS | Status: DC | PRN
  Administered 2022-09-02 (×3): 160 ug via INTRAVENOUS

## 2022-09-02 MED ORDER — OXYCODONE-ACETAMINOPHEN 5-325 MG PO TABS: 1.0000 | ORAL_TABLET | Freq: Four times a day (QID) | ORAL | 0 refills | Status: DC | PRN

## 2022-09-02 MED ORDER — LIDOCAINE-EPINEPHRINE (PF) 1 %-1:200000 IJ SOLN
INTRAMUSCULAR | Status: AC
  Filled 2022-09-02: qty 30

## 2022-09-02 MED ORDER — HYDROMORPHONE HCL 1 MG/ML IJ SOLN: 0.2500 mg | INTRAMUSCULAR | Status: DC | PRN

## 2022-09-02 MED ORDER — DEXMEDETOMIDINE HCL IN NACL 80 MCG/20ML IV SOLN
INTRAVENOUS | Status: AC
  Filled 2022-09-02: qty 20

## 2022-09-02 MED ORDER — EPHEDRINE 5 MG/ML INJ
INTRAVENOUS | Status: AC
  Filled 2022-09-02: qty 5

## 2022-09-02 MED ORDER — GLYCOPYRROLATE PF 0.2 MG/ML IJ SOSY
PREFILLED_SYRINGE | INTRAMUSCULAR | Status: DC | PRN
  Administered 2022-09-02: .2 mg via INTRAVENOUS

## 2022-09-02 MED ORDER — ONDANSETRON HCL 4 MG/2ML IJ SOLN
INTRAMUSCULAR | Status: DC | PRN
  Administered 2022-09-02: 4 mg via INTRAVENOUS

## 2022-09-02 MED ORDER — GLYCOPYRROLATE PF 0.2 MG/ML IJ SOSY
PREFILLED_SYRINGE | INTRAMUSCULAR | Status: AC
  Filled 2022-09-02: qty 1

## 2022-09-02 MED ORDER — FENTANYL CITRATE (PF) 100 MCG/2ML IJ SOLN
INTRAMUSCULAR | Status: AC
  Filled 2022-09-02: qty 2

## 2022-09-02 MED ORDER — HEPARIN 6000 UNIT IRRIGATION SOLUTION
Status: DC | PRN
  Administered 2022-09-02: 1

## 2022-09-02 MED ORDER — PHENYLEPHRINE 80 MCG/ML (10ML) SYRINGE FOR IV PUSH (FOR BLOOD PRESSURE SUPPORT)
PREFILLED_SYRINGE | INTRAVENOUS | Status: AC
  Filled 2022-09-02: qty 10

## 2022-09-02 MED ORDER — LIDOCAINE-EPINEPHRINE (PF) 2 %-1:200000 IJ SOLN
INTRAMUSCULAR | Status: DC | PRN
  Administered 2022-09-02: 12 mL via INTRADERMAL

## 2022-09-02 SURGICAL SUPPLY — 39 items
ADH SKN CLS APL DERMABOND .7 (GAUZE/BANDAGES/DRESSINGS) ×1
ARMBAND PINK RESTRICT EXTREMIT (MISCELLANEOUS) ×2 IMPLANT
BAG HAMPER (MISCELLANEOUS) ×2 IMPLANT
CANNULA VESSEL 3MM 2 BLNT TIP (CANNULA) ×2 IMPLANT
CLIP LIGATING EXTRA MED SLVR (CLIP) ×2 IMPLANT
CLIP LIGATING EXTRA SM BLUE (MISCELLANEOUS) ×2 IMPLANT
COVER LIGHT HANDLE STERIS (MISCELLANEOUS) ×4 IMPLANT
COVER MAYO STAND XLG (MISCELLANEOUS) ×2 IMPLANT
DECANTER SPIKE VIAL GLASS SM (MISCELLANEOUS) ×2 IMPLANT
DERMABOND ADVANCED .7 DNX12 (GAUZE/BANDAGES/DRESSINGS) ×2 IMPLANT
ELECT REM PT RETURN 9FT ADLT (ELECTROSURGICAL) ×1
ELECTRODE REM PT RTRN 9FT ADLT (ELECTROSURGICAL) ×2 IMPLANT
GAUZE SPONGE 4X4 12PLY STRL (GAUZE/BANDAGES/DRESSINGS) ×4 IMPLANT
GLOVE BIOGEL PI IND STRL 7.0 (GLOVE) ×4 IMPLANT
GLOVE SURG MICRO LTX SZ7.5 (GLOVE) ×2 IMPLANT
GOWN STRL REUS W/TWL LRG LVL3 (GOWN DISPOSABLE) ×6 IMPLANT
IV NS 500ML (IV SOLUTION) ×2
IV NS 500ML BAXH (IV SOLUTION) ×4 IMPLANT
KIT BLADEGUARD II DBL (SET/KITS/TRAYS/PACK) ×2 IMPLANT
KIT TURNOVER KIT A (KITS) ×2 IMPLANT
MANIFOLD NEPTUNE II (INSTRUMENTS) ×2 IMPLANT
MARKER SKIN DUAL TIP RULER LAB (MISCELLANEOUS) ×4 IMPLANT
NDL HYPO 18GX1.5 BLUNT FILL (NEEDLE) ×2 IMPLANT
NEEDLE HYPO 18GX1.5 BLUNT FILL (NEEDLE) ×1 IMPLANT
NS IRRIG 1000ML POUR BTL (IV SOLUTION) ×2 IMPLANT
PACK CV ACCESS (CUSTOM PROCEDURE TRAY) ×2 IMPLANT
PAD ARMBOARD 7.5X6 YLW CONV (MISCELLANEOUS) ×2 IMPLANT
SET BASIN LINEN APH (SET/KITS/TRAYS/PACK) ×2 IMPLANT
SOL PREP POV-IOD 4OZ 10% (MISCELLANEOUS) ×2 IMPLANT
SOL PREP PROV IODINE SCRUB 4OZ (MISCELLANEOUS) ×2 IMPLANT
SUT PROLENE 6 0 CC (SUTURE) ×2 IMPLANT
SUT SILK 2 0 FSL 18 (SUTURE) ×2 IMPLANT
SUT VIC AB 3-0 SH 27 (SUTURE) ×1
SUT VIC AB 3-0 SH 27X BRD (SUTURE) ×2 IMPLANT
SYR 10ML LL (SYRINGE) ×2 IMPLANT
SYR 50ML LL SCALE MARK (SYRINGE) ×2 IMPLANT
SYR CONTROL 10ML LL (SYRINGE) ×2 IMPLANT
SYR TOOMEY 50ML (SYRINGE) IMPLANT
UNDERPAD 30X36 HEAVY ABSORB (UNDERPADS AND DIAPERS) ×2 IMPLANT

## 2022-09-02 NOTE — Progress Notes (Signed)
I spokeby telephone with the patient's legal guardian, [name].  I explained that she had inadequate axillary vein for upper arm graft.  I also explained the next option would be consideration of a right arm hero graft versus a left femoral graft.  I explained that in our experience the hero graft had not worked well but [name] had a left arm hero for 10 years.  I did explain that this surgery would be done with one of my partners at [facility]  I will discuss this with Dr. Myra Gianotti who has been involved in her care as well.

## 2022-09-02 NOTE — Anesthesia Preprocedure Evaluation (Signed)
Anesthesia Evaluation  Patient identified by MRN, date of birth, ID band Patient awake    Reviewed: Allergy & Precautions, H&P , NPO status , Patient's Chart, lab work & pertinent test results  Airway Mallampati: II  TM Distance: >3 FB Neck ROM: Full    Dental  (+) Dental Advisory Given, Missing, Poor Dentition   Pulmonary Current Smoker and Patient abstained from smoking.   Pulmonary exam normal breath sounds clear to auscultation       Cardiovascular hypertension, Pt. on medications + DVT  Normal cardiovascular exam Rhythm:Regular Rate:Normal     Neuro/Psych  Headaches, Seizures -, Well Controlled,  PSYCHIATRIC DISORDERS Anxiety Depression     Neuromuscular disease    GI/Hepatic Neg liver ROS,GERD  Medicated,,  Endo/Other  diabetes    Renal/GU Dialysis and ESRFRenal disease  negative genitourinary   Musculoskeletal  (+) Arthritis , Osteoarthritis,    Abdominal   Peds negative pediatric ROS (+)  Hematology  (+) Blood dyscrasia, anemia   Anesthesia Other Findings Lupus   Reproductive/Obstetrics negative OB ROS                             Anesthesia Physical Anesthesia Plan  ASA: 3  Anesthesia Plan: General   Post-op Pain Management: Dilaudid IV   Induction: Intravenous  PONV Risk Score and Plan: 3 and Ondansetron and Dexamethasone  Airway Management Planned: LMA, Nasal Cannula and Natural Airway  Additional Equipment:   Intra-op Plan:   Post-operative Plan: Extubation in OR  Informed Consent: I have reviewed the patients History and Physical, chart, labs and discussed the procedure including the risks, benefits and alternatives for the proposed anesthesia with the patient or authorized representative who has indicated his/her understanding and acceptance.     Dental advisory given  Plan Discussed with: CRNA and Surgeon  Anesthesia Plan Comments:          Anesthesia Quick Evaluation

## 2022-09-02 NOTE — Anesthesia Postprocedure Evaluation (Signed)
Anesthesia Post Note  Patient: Janae Taucher  Procedure(s) Performed: EXPLORATION OF RIGHT AXILLA, INADEQUATE VEIN ACCESS (Right: Arm Upper)  Patient location during evaluation: Phase II Anesthesia Type: General Level of consciousness: awake and alert and oriented Pain management: pain level controlled Vital Signs Assessment: post-procedure vital signs reviewed and stable Respiratory status: spontaneous breathing, nonlabored ventilation and respiratory function stable Cardiovascular status: blood pressure returned to baseline and stable Postop Assessment: no apparent nausea or vomiting Anesthetic complications: no  No notable events documented.   Last Vitals:  Vitals:   09/02/22 1345 09/02/22 1403  BP: (!) 98/49 (!) 112/99  Pulse: 83 86  Resp: 16 18  Temp: (!) 36.4 C   SpO2: 100% 100%    Last Pain:  Vitals:   09/02/22 1345  TempSrc:   PainSc: 0-No pain                 Dawnita Molner C Jaxon Mynhier

## 2022-09-02 NOTE — Anesthesia Procedure Notes (Signed)
Procedure Name: LMA Insertion Date/Time: 09/02/2022 12:28 PM  Performed by: Julian Reil, CRNAPre-anesthesia Checklist: Patient identified, Emergency Drugs available, Suction available and Patient being monitored Patient Re-evaluated:Patient Re-evaluated prior to induction Oxygen Delivery Method: Circle system utilized Preoxygenation: Pre-oxygenation with 100% oxygen Induction Type: IV induction Ventilation: Mask ventilation without difficulty LMA: LMA with gastric port inserted LMA Size: 4.0 Tube type: Oral Number of attempts: 1 Placement Confirmation: positive ETCO2 Tube secured with: Tape Dental Injury: Bloody posterior oropharynx  Comments: Scant amount bloody secretions noted after LMA placement, orally suctioned gently.

## 2022-09-02 NOTE — Interval H&P Note (Signed)
History and Physical Interval Note:  09/02/2022 11:01 AM  [name]  has presented today for surgery, with the diagnosis of ESRD.  The various methods of treatment have been discussed with the patient and family. After consideration of risks, benefits and other options for treatment, the patient has consented to  Procedure(s): INSERTION OF RIGHT UPPER AMR ARTERIOVENOUS (AV) GORE-TEX GRAFT (Right) as a surgical intervention.  The patient's history has been reviewed, patient examined, no change in status, stable for surgery.  I have reviewed the patient's chart and labs.  Questions were answered to the patient's satisfaction.     [name]

## 2022-09-02 NOTE — Transfer of Care (Signed)
Immediate Anesthesia Transfer of Care Note  Patient: [name]  Procedure(s) Performed: EXPLORATION OF RIGHT AXILLA, INADEQUATE VEIN ACCESS (Right: Arm Upper)  Patient Location: PACU  Anesthesia Type:General  Level of Consciousness: drowsy  Airway & Oxygen Therapy: Patient Spontanous Breathing and Patient connected to face mask oxygen  Post-op Assessment: Report given to RN and Post -op Vital signs reviewed and stable  Post vital signs: Reviewed and stable  Last Vitals:  Vitals Value Taken Time  BP    Temp    Pulse    Resp    SpO2      Last Pain:  Vitals:   09/02/22 1046  TempSrc: Oral  PainSc: 0-No pain      Patients Stated Pain Goal: 5 (09/02/22 1046)  Complications: No notable events documented.

## 2022-09-02 NOTE — Op Note (Signed)
    OPERATIVE REPORT  DATE OF SURGERY: 09/02/2022  PATIENT: [name], 47 y.o. female MRN: [id number]  DOB: 09-13-1975  PRE-OPERATIVE DIAGNOSIS: End-stage renal disease  POST-OPERATIVE DIAGNOSIS:  Same  PROCEDURE: Right axillary exploration  SURGEON:  [name], M.D.  PHYSICIAN ASSISTANT: Rebeca Alert, RNFA  The assistant was needed for exposure and to expedite the case  ANESTHESIA: LMA  EBL: per anesthesia record  Total I/O In: 600 [I.V.:500; IV Piggyback:100] Out: -   BLOOD ADMINISTERED: none  DRAINS: none  SPECIMEN: none  COUNTS CORRECT:  YES  PATIENT DISPOSITION:  PACU - hemodynamically stable  PROCEDURE DETAILS: The patient was taken to the operating room and placed where the area of the right arm and right axilla were prepped and draped in usual sterile fashion.  Local anesthesia was used in addition to LMA.  An incision was made through the old axillary scar and carried down to isolate the prior Gore-Tex graft.  The vein at this area was extremely sclerotic.  The vein was exposed further proximally and was opened.  Even proximal to the old venous anastomosis the vein was small and would not except a 2 mm dilator.  The incision was extended further proximally and the vein was exposed higher in the axilla.  There was a tributary branch that entered the vein and the vein appeared to be slightly bigger above this.  The vein was opened longitudinally past this area and a 2-1/2 dilator would not pass through this vein.  The vein was ligated.  Expiration of the axilla showed no other veins acceptable for anastomosis.  There was a small friable tributary vein alongside the brachial plexus.  Felt that no option for upper arm access was possible and therefore the wound was closed.  This was done with 3-0 Vicryl in the subcutaneous and subcuticular tissue.  Sterile dressing was applied and the patient was transferred to the recovery room in stable condition   Larina Earthly, M.D., [facility] 09/02/2022 2:06 PM  Note: Portions of this report may have been transcribed using voice recognition software.  Every effort has been made to ensure accuracy; however, inadvertent computerized transcription errors may still be present.

## 2022-09-04 ENCOUNTER — Encounter (HOSPITAL_COMMUNITY): Payer: Self-pay | Admitting: Vascular Surgery

## 2022-09-04 ENCOUNTER — Other Ambulatory Visit: Payer: Self-pay

## 2022-09-04 DIAGNOSIS — N186 End stage renal disease: Secondary | ICD-10-CM

## 2022-09-23 ENCOUNTER — Encounter (HOSPITAL_COMMUNITY): Payer: Self-pay | Admitting: Surgery

## 2022-09-23 ENCOUNTER — Other Ambulatory Visit: Payer: Self-pay

## 2022-09-23 NOTE — Progress Notes (Signed)
SDW call  Patient is a resident at [facility], [phone].  Reviewed pre-surgical information with Benjamine Mola.  Also reviewed pre-surgical information with Lennox Pippins, Administrator, [phone].  Pre-surgical information was faxed to the facility at [phone].    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  Unable to speak to guardian at this time as she is out of the office.     PCP - Dr. Avon Gully Cardiologist - denies Pulmonary: denies   PPM/ICD - denies  Chest x-ray - n/a EKG -  02/11/2022 Stress Test - ECHO -  Cardiac Cath -   Sleep Study/sleep apnea/CPAP: denies  Non-diabetic   Blood Thinner Instructions: Plavix, states last dose was 09/18/2022 Aspirin Instructions:states last dose was 09/23/2022   ERAS Protcol - No, NPO PRE-SURGERY Ensure or G2-    COVID TEST- n/a    Anesthesia review: Yes. HTN, ESRD, Lupus, dialysis   Patient denies shortness of breath, fever, cough and chest pain over the phone call

## 2022-09-23 NOTE — Anesthesia Preprocedure Evaluation (Signed)
Anesthesia Evaluation  Patient identified by MRN, date of birth, ID band Patient awake    Reviewed: Allergy & Precautions, H&P , NPO status , Patient's Chart, lab work & pertinent test results  Airway Mallampati: II  TM Distance: >3 FB Neck ROM: Full    Dental  (+) Dental Advisory Given, Missing, Poor Dentition   Pulmonary Current Smoker and Patient abstained from smoking.   Pulmonary exam normal breath sounds clear to auscultation       Cardiovascular hypertension, Pt. on medications + DVT  Normal cardiovascular exam Rhythm:Regular Rate:Normal     Neuro/Psych  Headaches, Seizures -, Well Controlled,  PSYCHIATRIC DISORDERS Anxiety Depression     Neuromuscular disease    GI/Hepatic Neg liver ROS,GERD  Medicated,,  Endo/Other  diabetes    Renal/GU Dialysis and ESRFRenal disease     Musculoskeletal  (+) Arthritis , Osteoarthritis,    Abdominal   Peds  Hematology  (+) Blood dyscrasia, anemia   Anesthesia Other Findings Lupus   Reproductive/Obstetrics negative OB ROS                              Anesthesia Physical Anesthesia Plan  ASA: 3  Anesthesia Plan: General   Post-op Pain Management: Ofirmev IV (intra-op)*   Induction: Intravenous  PONV Risk Score and Plan: 3 and Ondansetron, Dexamethasone, Treatment may vary due to age or medical condition and Midazolam  Airway Management Planned: LMA  Additional Equipment:   Intra-op Plan:   Post-operative Plan: Extubation in OR  Informed Consent: I have reviewed the patients History and Physical, chart, labs and discussed the procedure including the risks, benefits and alternatives for the proposed anesthesia with the patient or authorized representative who has indicated his/her understanding and acceptance.     Dental advisory given  Plan Discussed with: CRNA  Anesthesia Plan Comments: (PAT note written 09/23/2022 by Shonna Chock, PA-C.  )         Anesthesia Quick Evaluation

## 2022-09-23 NOTE — Progress Notes (Signed)
Anesthesia Chart Review: [name]  Case: [id number] Date/Time: 09/25/22 0715   Procedures:      INSERTION OF LEFT FEMORAL LOOP GRAFT (Left)     REMOVAL OF LEFT INTERNAL JUGULAR HERO CATHETER (Left)   Anesthesia type: Choice   Pre-op diagnosis: ESRD   [facility]OM [facility] / [facility]   Surgeons: Nada Libman, MD       DISCUSSION: Patient is a 47 year old female scheduled for the above procedure. She had a long term LUE HeRO dialysis graft with multiple interventions that ultimately thrombosed earlier this year, s/p TDC. S/p exploration of right axilla 09/02/22 by Dr. Arbie Cookey and found to have inadequate right axillary vein for UE AVGG. She was referred to Dr. Myra Gianotti for consideration of left femoral graft versus RUE HeRo graft.   History includes smoking, HTN, Lupus (SLE), ESRD ([facility] MWF), anemia, DVT, seizure (2012). Notes indicate that she has an intellectual disability and resides at [facility].  Hold Plavix x 5 days per VVS.  Notes from 2014 indicate she is Plavix for history of SLE with possible prior TIA.  Anesthesia team to evaluate on the day of surgery.   VS: LMP  (LMP Unknown)  BP Readings from Last 3 Encounters:  09/02/22 111/67  08/26/22 (!) 130/53  08/13/22 98/65   Pulse Readings from Last 3 Encounters:  09/02/22 75  08/26/22 64  08/13/22 81    PROVIDERS: Benetta Spar, MD is PCP    LABS: For day of surgery. As of 09/02/22, H/H 12.7/37.8. PLT 216 07/02/22.   IMAGES: 1V PCXR 01/23/22: IMPRESSION: Left-sided vascular graft is in stable position and appears intact. No radiographic evidence of acute cardiopulmonary process.   EKG: 02/08/22: Sinus tachycardia at 104 bpm Biatrial enlargement RSR' in V1 or V2, probably normal variant Baseline wander in lead(s) V4 Confirmed by Eber Hong ([id number]) on 02/08/2022 4:55:59 PM  CV: Echo 01/17/08:  SUMMARY   -  Overall left ventricular systolic function was normal. There  were         no left ventricular regional wall motion abnormalities. Left         ventricular wall thickness was at the upper limits of normal.   -  small pericardial effusion over the posterolateral aspect of the         LV. There was a moderate-sized, left pleural effusion.    COMPARISONS   -  Compared to the previous study of 11-Jan-2008 :pericardial         effusion is slightly larger.    Past Medical History:  Diagnosis Date   Anemia    BV (bacterial vaginosis) 11/08/2013   Depression    Dialysis patient [facility])    DVT (deep venous thrombosis) (HCC)    Hypertension    Lupus (HCC)    Renal disorder    Vaginal odor 11/08/2013    Past Surgical History:  Procedure Laterality Date   A/V SHUNT INTERVENTION N/A 10/24/2016   Procedure: A/V SHUNT INTERVENTION;  Surgeon: Renford Dills, MD;  Location: [facility] INVASIVE CV LAB;  Service: Cardiovascular;  Laterality: N/A;   A/V SHUNTOGRAM Left 12/08/2017   Procedure: A/V SHUNTOGRAM;  Surgeon: Renford Dills, MD;  Location: [facility] INVASIVE CV LAB;  Service: Cardiovascular;  Laterality: Left;   A/V SHUNTOGRAM Left 01/28/2018   Procedure: A/V SHUNTOGRAM;  Surgeon: Annice Needy, MD;  Location: [facility] INVASIVE CV LAB;  Service: Cardiovascular;  Laterality: Left;   A/V SHUNTOGRAM Left 04/01/2018  Procedure: A/V SHUNTOGRAM;  Surgeon: Annice Needy, MD;  Location: [facility] INVASIVE CV LAB;  Service: Cardiovascular;  Laterality: Left;   A/V SHUNTOGRAM Left 04/28/2019   Procedure: A/V SHUNTOGRAM;  Surgeon: Annice Needy, MD;  Location: [facility] INVASIVE CV LAB;  Service: Cardiovascular;  Laterality: Left;   AV FISTULA PLACEMENT     AV FISTULA PLACEMENT Left 02/09/2022   Procedure: FISTULAGRAM, placement of left arm stent.;  Surgeon: Nada Libman, MD;  Location: [facility] OR;  Service: Vascular;  Laterality: Left;   AV FISTULA PLACEMENT Right 09/02/2022   Procedure: EXPLORATION OF RIGHT AXILLA, INADEQUATE VEIN ACCESS;  Surgeon: Larina Earthly, MD;  Location: [facility]S;   Service: Vascular;  Laterality: Right;   DIALYSIS/PERMA CATHETER INSERTION  10/23/2016   Procedure: DIALYSIS/PERMA CATHETER INSERTION;  Surgeon: Annice Needy, MD;  Location: [facility] INVASIVE CV LAB;  Service: Cardiovascular;;   IR REMOVAL TUN CV CATH W/O FL  09/22/2017   IR THROMBECTOMY AV FISTULA W/THROMBOLYSIS INC/SHUNT/IMG LEFT Left 09/04/2017   IR US GUIDE VASC ACCESS LEFT  09/04/2017   MASS EXCISION Left 06/01/2018   Procedure: EXCISION 5 X 3CM LIPOMA LEFT THIGH;  Surgeon: Franky Macho, MD;  Location: [facility];  Service: General;  Laterality: Left;   PERIPHERAL VASCULAR CATHETERIZATION Left 01/04/2015   Procedure: A/V Shuntogram/Fistulagram;  Surgeon: Annice Needy, MD;  Location: [facility] INVASIVE CV LAB;  Service: Cardiovascular;  Laterality: Left;   PERIPHERAL VASCULAR CATHETERIZATION N/A 01/04/2015   Procedure: A/V Shunt Intervention;  Surgeon: Annice Needy, MD;  Location: [facility] INVASIVE CV LAB;  Service: Cardiovascular;  Laterality: N/A;   PERIPHERAL VASCULAR CATHETERIZATION Left 04/13/2015   Procedure: A/V Shuntogram/Fistulagram;  Surgeon: Renford Dills, MD;  Location: [facility] INVASIVE CV LAB;  Service: Cardiovascular;  Laterality: Left;   PERIPHERAL VASCULAR CATHETERIZATION Left 04/13/2015   Procedure: A/V Shunt Intervention;  Surgeon: Renford Dills, MD;  Location: [facility] INVASIVE CV LAB;  Service: Cardiovascular;  Laterality: Left;   UPPER EXTREMITY VENOGRAPHY N/A 08/26/2022   Procedure: UPPER EXTREMITY VENOGRAPHY;  Surgeon: Nada Libman, MD;  [facility];  Service: Cardiovascular;  Laterality: N/A;    MEDICATIONS:  0.9 %  sodium chloride infusion   sodium chloride flush (NS) 0.9 % injection 3 mL    albuterol (PROVENTIL) (2.5 MG/3ML) 0.083% nebulizer solution   ALPRAZolam (XANAX) 0.25 MG tablet   aspirin EC 81 MG tablet   Calcium Carbonate Antacid (TUMS PO)   chlorhexidine (PERIDEX) 0.12 % solution   clopidogrel (PLAVIX) 75 MG tablet   docusate sodium (COLACE) 100 MG  capsule   epoetin alfa (EPOGEN,PROCRIT) 4000 UNIT/ML injection   guaiFENesin (ROBITUSSIN) 100 MG/5ML liquid   hydroxychloroquine (PLAQUENIL) 200 MG tablet   lidocaine-prilocaine (EMLA) cream   loratadine (CLARITIN) 10 MG tablet   midodrine (PROAMATINE) 10 MG tablet   mirtazapine (REMERON) 15 MG tablet   multivitamin (RENA-VIT) TABS tablet   oxyCODONE-acetaminophen (PERCOCET) 5-325 MG tablet   pantoprazole (PROTONIX) 40 MG tablet   phenytoin (DILANTIN) 100 MG ER capsule   RESTASIS 0.05 % ophthalmic emulsion   sertraline (ZOLOFT) 100 MG tablet   simvastatin (ZOCOR) 40 MG tablet    Shonna Chock, PA-C Surgical Short Stay/Anesthesiology [facility] Phone [phone] [facility] Phone [phone] 09/23/2022 1:22 PM

## 2022-09-23 NOTE — Progress Notes (Signed)
Surgical Instructions    Your procedure is scheduled on Thursday September 25, 2022.  Report to [facility] Main Entrance "A" at 5:30 A.M., then check in with the Admitting office.  Call this number if you have problems the morning of surgery:  [phone]   If you have any questions prior to your surgery date call [phone]: Open Monday-Friday 8am-4pm If you experience any cold or flu symptoms such as cough, fever, chills, shortness of breath, etc. between now and your scheduled surgery, please notify us at the above number    Remember:  Do not eat or drink after midnight the night before your surgery   Take these medicines the morning of surgery with A SIP OF WATER:  ALPRAZolam (XANAX)  hydroxychloroquine (PLAQUENIL)  midodrine (PROAMATINE)  pantoprazole (PROTONIX)  phenytoin (DILANTIN)  RESTASIS 0.05 % ophthalmic emulsion  sertraline (ZOLOFT)    As needed: albuterol (PROVENTIL)  loratadine (CLARITIN)    As of today, STOP taking any Aspirin (unless otherwise instructed by your surgeon) Aleve, Naproxen, Ibuprofen, Motrin, Advil, Goody's, BC's, all herbal medications, fish oil, and all vitamins.           Do NOT Smoke (Tobacco/Vaping)  24 hours prior to your procedure  If you use a CPAP at night, you may bring your mask for your overnight stay.   Contacts, glasses, hearing aids, dentures or partials may not be worn into surgery, please bring cases for these belongings   For patients admitted to the hospital, discharge time will be determined by your treatment team.   Patients discharged the day of surgery will not be allowed to drive home, and someone needs to stay with them for 24 hours.   SURGICAL WAITING ROOM VISITATION Patients having surgery or a procedure may have no more than 2 support people in the waiting area - these visitors may rotate.   Children under the age of 50 must have an adult with them who is not the patient. If the patient needs to stay at the  hospital during part of their recovery, the visitor guidelines for inpatient rooms apply. Pre-op nurse will coordinate an appropriate time for 1 support person to accompany patient in pre-op.  This support person may not rotate.   Please refer to [email or web address] for the visitor guidelines for Inpatients (after your surgery is over and you are in a regular room).    Special instructions:    Oral Hygiene is also important to reduce your risk of infection.  Remember - BRUSH YOUR TEETH THE MORNING OF SURGERY WITH YOUR REGULAR TOOTHPASTE   [place]- Preparing For Surgery  Before surgery, you can play an important role. Because skin is not sterile, your skin needs to be as free of germs as possible. You can reduce the number of germs on your skin by washing with Dial Soap before surgery.  Please follow these instructions carefully.    Shower the NIGHT BEFORE SURGERY and the MORNING OF SURGERY with Dial Soap.   Pat yourself dry with a CLEAN TOWEL.  Wear CLEAN PAJAMAS to bed the night before surgery  Place CLEAN SHEETS on your bed the night before your surgery  Day of Surgery:  Take a shower with Dial soap. Do not wear jewelry or makeup. Do not wear lotions, powders, perfume or deodorant. Do not shave 48 hours prior to surgery.  Do not bring valuables to the hospital. Do not wear nail polish, gel polish, artificial nails, or any other type of covering on natural  nails (fingers and toes) If you have artificial nails or gel coating that need to be removed by a nail salon, please have this removed prior to surgery. Artificial nails or gel coating may interfere with anesthesia's ability to adequately monitor your vital signs.  [facility] is not responsible for any belongings or valuables.   Wear Clean/Comfortable clothing the morning of surgery  Remember to brush your teeth WITH YOUR REGULAR TOOTHPASTE.    If you received a COVID  test during your pre-op visit, it is requested that you wear a mask when out in public, stay away from anyone that may not be feeling well, and notify your surgeon if you develop symptoms. If you have been in contact with anyone that has tested positive in the last 10 days, please notify your surgeon.    Please read over the following fact sheets that you were given.

## 2022-09-25 ENCOUNTER — Other Ambulatory Visit: Payer: Self-pay

## 2022-09-25 ENCOUNTER — Encounter (HOSPITAL_COMMUNITY): Payer: Self-pay | Admitting: Surgery

## 2022-09-25 ENCOUNTER — Ambulatory Visit (HOSPITAL_COMMUNITY)
Admission: RE | Admit: 2022-09-25 | Discharge: 2022-09-25 | Disposition: A | Discharge status: Home / Self Care | Payer: Medicare Other | Attending: Surgery | Admitting: Surgery

## 2022-09-25 ENCOUNTER — Ambulatory Visit (HOSPITAL_COMMUNITY): Payer: Medicare Other | Admitting: Vascular Surgery

## 2022-09-25 ENCOUNTER — Ambulatory Visit (HOSPITAL_BASED_OUTPATIENT_CLINIC_OR_DEPARTMENT_OTHER): Payer: Medicare Other | Admitting: Vascular Surgery

## 2022-09-25 ENCOUNTER — Encounter (HOSPITAL_COMMUNITY)
Admission: RE | Disposition: A | Discharge status: Home / Self Care | Payer: Self-pay | Source: Home / Self Care | Attending: Surgery

## 2022-09-25 DIAGNOSIS — Z86718 Personal history of other venous thrombosis and embolism: Secondary | ICD-10-CM | POA: Insufficient documentation

## 2022-09-25 DIAGNOSIS — I120 Hypertensive chronic kidney disease with stage 5 chronic kidney disease or end stage renal disease: Secondary | ICD-10-CM

## 2022-09-25 DIAGNOSIS — D649 Anemia, unspecified: Secondary | ICD-10-CM | POA: Insufficient documentation

## 2022-09-25 DIAGNOSIS — F32A Depression, unspecified: Secondary | ICD-10-CM | POA: Diagnosis not present

## 2022-09-25 DIAGNOSIS — N185 Chronic kidney disease, stage 5: Secondary | ICD-10-CM | POA: Diagnosis not present

## 2022-09-25 DIAGNOSIS — N186 End stage renal disease: Secondary | ICD-10-CM | POA: Insufficient documentation

## 2022-09-25 DIAGNOSIS — D759 Disease of blood and blood-forming organs, unspecified: Secondary | ICD-10-CM | POA: Insufficient documentation

## 2022-09-25 DIAGNOSIS — E1122 Type 2 diabetes mellitus with diabetic chronic kidney disease: Secondary | ICD-10-CM | POA: Insufficient documentation

## 2022-09-25 DIAGNOSIS — Z992 Dependence on renal dialysis: Secondary | ICD-10-CM | POA: Diagnosis not present

## 2022-09-25 DIAGNOSIS — F418 Other specified anxiety disorders: Secondary | ICD-10-CM | POA: Insufficient documentation

## 2022-09-25 DIAGNOSIS — K219 Gastro-esophageal reflux disease without esophagitis: Secondary | ICD-10-CM | POA: Insufficient documentation

## 2022-09-25 HISTORY — PX: REMOVAL OF A DIALYSIS CATHETER: SHX6053

## 2022-09-25 HISTORY — PX: AV FISTULA PLACEMENT: SHX1204

## 2022-09-25 LAB — POCT I-STAT, CHEM 8
BUN: 31 mg/dL — ABNORMAL HIGH (ref 6–20)
Calcium, Ion: 1.16 mmol/L (ref 1.15–1.40)
Chloride: 98 mmol/L (ref 98–111)
Creatinine, Ser: 6.6 mg/dL — ABNORMAL HIGH (ref 0.44–1.00)
Glucose, Bld: 80 mg/dL (ref 70–99)
HCT: 43 % (ref 36.0–46.0)
Hemoglobin: 14.6 g/dL (ref 12.0–15.0)
Potassium: 4.3 mmol/L (ref 3.5–5.1)
Sodium: 138 mmol/L (ref 135–145)
TCO2: 29 mmol/L (ref 22–32)

## 2022-09-25 LAB — HCG, SERUM, QUALITATIVE: Preg, Serum: NEGATIVE

## 2022-09-25 SURGERY — INSERTION OF ARTERIOVENOUS (AV) GORE-TEX GRAFT THIGH
Anesthesia: General | Site: Leg Upper | Laterality: Left

## 2022-09-25 MED ORDER — LIDOCAINE-EPINEPHRINE (PF) 1 %-1:200000 IJ SOLN
INTRAMUSCULAR | Status: AC
  Filled 2022-09-25: qty 30

## 2022-09-25 MED ORDER — 0.9 % SODIUM CHLORIDE (POUR BTL) OPTIME
TOPICAL | Status: DC | PRN
  Administered 2022-09-25: 2000 mL

## 2022-09-25 MED ORDER — HEPARIN 6000 UNIT IRRIGATION SOLUTION
Status: DC | PRN
  Administered 2022-09-25: 1

## 2022-09-25 MED ORDER — MEPERIDINE HCL 25 MG/ML IJ SOLN: 6.2500 mg | INTRAMUSCULAR | Status: DC | PRN

## 2022-09-25 MED ORDER — FENTANYL CITRATE (PF) 250 MCG/5ML IJ SOLN
INTRAMUSCULAR | Status: AC
  Filled 2022-09-25: qty 5

## 2022-09-25 MED ORDER — ONDANSETRON HCL 4 MG/2ML IJ SOLN
INTRAMUSCULAR | Status: AC
  Filled 2022-09-25: qty 2

## 2022-09-25 MED ORDER — OXYCODONE-ACETAMINOPHEN 5-325 MG PO TABS: 1.0000 | ORAL_TABLET | Freq: Four times a day (QID) | ORAL | 0 refills | Status: DC | PRN

## 2022-09-25 MED ORDER — CHLORHEXIDINE GLUCONATE 4 % EX SOLN: 60.0000 mL | Freq: Once | CUTANEOUS | Status: DC

## 2022-09-25 MED ORDER — ORAL CARE MOUTH RINSE: 15.0000 mL | Freq: Once | OROMUCOSAL | Status: DC

## 2022-09-25 MED ORDER — AMISULPRIDE (ANTIEMETIC) 5 MG/2ML IV SOLN: 10.0000 mg | Freq: Once | INTRAVENOUS | Status: DC | PRN

## 2022-09-25 MED ORDER — OXYCODONE HCL 5 MG PO TABS: 5.0000 mg | ORAL_TABLET | Freq: Once | ORAL | Status: DC | PRN

## 2022-09-25 MED ORDER — DEXAMETHASONE SODIUM PHOSPHATE 10 MG/ML IJ SOLN
INTRAMUSCULAR | Status: DC | PRN
  Administered 2022-09-25: 10 mg via INTRAVENOUS

## 2022-09-25 MED ORDER — OXYCODONE HCL 5 MG/5ML PO SOLN: 5.0000 mg | Freq: Once | ORAL | Status: DC | PRN

## 2022-09-25 MED ORDER — CHLORHEXIDINE GLUCONATE 0.12 % MT SOLN
15.0000 mL | Freq: Once | OROMUCOSAL | Status: DC
  Filled 2022-09-25: qty 15

## 2022-09-25 MED ORDER — EPHEDRINE 5 MG/ML INJ
INTRAVENOUS | Status: AC
  Filled 2022-09-25: qty 5

## 2022-09-25 MED ORDER — MIDAZOLAM HCL 2 MG/2ML IJ SOLN
INTRAMUSCULAR | Status: AC
  Filled 2022-09-25: qty 2

## 2022-09-25 MED ORDER — PROMETHAZINE HCL 25 MG/ML IJ SOLN: 6.2500 mg | INTRAMUSCULAR | Status: DC | PRN

## 2022-09-25 MED ORDER — SODIUM CHLORIDE 0.9 % IV BOLUS
250.0000 mL | Freq: Once | INTRAVENOUS | Status: AC
  Administered 2022-09-25: 250 mL via INTRAVENOUS

## 2022-09-25 MED ORDER — ONDANSETRON HCL 4 MG/2ML IJ SOLN
INTRAMUSCULAR | Status: DC | PRN
  Administered 2022-09-25: 4 mg via INTRAVENOUS

## 2022-09-25 MED ORDER — SODIUM CHLORIDE 0.9 % IV SOLN: INTRAVENOUS | Status: DC

## 2022-09-25 MED ORDER — FENTANYL CITRATE (PF) 250 MCG/5ML IJ SOLN
INTRAMUSCULAR | Status: DC | PRN
  Administered 2022-09-25 (×2): 25 ug via INTRAVENOUS
  Administered 2022-09-25: 50 ug via INTRAVENOUS

## 2022-09-25 MED ORDER — HYDROMORPHONE HCL 1 MG/ML IJ SOLN: 0.2500 mg | INTRAMUSCULAR | Status: DC | PRN

## 2022-09-25 MED ORDER — BUPIVACAINE HCL (PF) 0.5 % IJ SOLN
INTRAMUSCULAR | Status: AC
  Filled 2022-09-25: qty 30

## 2022-09-25 MED ORDER — EPHEDRINE SULFATE-NACL 50-0.9 MG/10ML-% IV SOSY
PREFILLED_SYRINGE | INTRAVENOUS | Status: DC | PRN
  Administered 2022-09-25: 10 mg via INTRAVENOUS

## 2022-09-25 MED ORDER — SODIUM CHLORIDE FLUSH 0.9 % IV SOLN
INTRAVENOUS | Status: DC | PRN
  Administered 2022-09-25: 75 mL

## 2022-09-25 MED ORDER — CEFAZOLIN SODIUM-DEXTROSE 2-4 GM/100ML-% IV SOLN
2.0000 g | INTRAVENOUS | Status: AC
  Administered 2022-09-25: 2 g via INTRAVENOUS
  Filled 2022-09-25: qty 100

## 2022-09-25 MED ORDER — DEXAMETHASONE SODIUM PHOSPHATE 10 MG/ML IJ SOLN
INTRAMUSCULAR | Status: AC
  Filled 2022-09-25: qty 1

## 2022-09-25 MED ORDER — PHENYLEPHRINE HCL-NACL 20-0.9 MG/250ML-% IV SOLN
INTRAVENOUS | Status: DC | PRN
  Administered 2022-09-25: 50 ug/min via INTRAVENOUS

## 2022-09-25 MED ORDER — LIDOCAINE 2% (20 MG/ML) 5 ML SYRINGE
INTRAMUSCULAR | Status: DC | PRN
  Administered 2022-09-25: 60 mg via INTRAVENOUS

## 2022-09-25 MED ORDER — BUPIVACAINE LIPOSOME 1.3 % IJ SUSP
INTRAMUSCULAR | Status: AC
  Filled 2022-09-25: qty 20

## 2022-09-25 MED ORDER — HEPARIN 6000 UNIT IRRIGATION SOLUTION
Status: AC
  Filled 2022-09-25: qty 500

## 2022-09-25 MED ORDER — PROPOFOL 10 MG/ML IV BOLUS
INTRAVENOUS | Status: AC
  Filled 2022-09-25: qty 20

## 2022-09-25 MED ORDER — SURGIFLO WITH THROMBIN (HEMOSTATIC MATRIX KIT) OPTIME
TOPICAL | Status: DC | PRN
  Administered 2022-09-25: 1 via TOPICAL

## 2022-09-25 MED ORDER — PROPOFOL 10 MG/ML IV BOLUS
INTRAVENOUS | Status: DC | PRN
  Administered 2022-09-25: 90 mg via INTRAVENOUS

## 2022-09-25 MED ORDER — MIDAZOLAM HCL 2 MG/2ML IJ SOLN
INTRAMUSCULAR | Status: DC | PRN
  Administered 2022-09-25: 2 mg via INTRAVENOUS

## 2022-09-25 MED ORDER — PHENYLEPHRINE 80 MCG/ML (10ML) SYRINGE FOR IV PUSH (FOR BLOOD PRESSURE SUPPORT)
PREFILLED_SYRINGE | INTRAVENOUS | Status: DC | PRN
  Administered 2022-09-25: 80 ug via INTRAVENOUS

## 2022-09-25 MED ORDER — LIDOCAINE 2% (20 MG/ML) 5 ML SYRINGE
INTRAMUSCULAR | Status: AC
  Filled 2022-09-25: qty 5

## 2022-09-25 SURGICAL SUPPLY — 58 items
ADH SKN CLS APL DERMABOND .7 (GAUZE/BANDAGES/DRESSINGS) ×4
AGENT HMST KT MTR STRL THRMB (HEMOSTASIS) ×2
BAG COUNTER SPONGE SURGICOUNT (BAG) ×3 IMPLANT
BAG DECANTER FOR FLEXI CONT (MISCELLANEOUS) ×3 IMPLANT
BAG SPNG CNTER NS LX DISP (BAG)
BIOPATCH RED 1 DISK 7.0 (GAUZE/BANDAGES/DRESSINGS) IMPLANT
CLIP TI MEDIUM 6 (CLIP) ×3 IMPLANT
CLIP TI WIDE RED SMALL 6 (CLIP) ×3 IMPLANT
COVER PROBE W GEL 5X96 (DRAPES) IMPLANT
COVER SURGICAL LIGHT HANDLE (MISCELLANEOUS) ×3 IMPLANT
DERMABOND ADVANCED .7 DNX12 (GAUZE/BANDAGES/DRESSINGS) ×3 IMPLANT
DRAPE C-ARM 42X72 X-RAY (DRAPES) IMPLANT
DRAPE CHEST BREAST 15X10 FENES (DRAPES) ×3 IMPLANT
DRAPE INCISE IOBAN 66X45 STRL (DRAPES) ×3 IMPLANT
DRAPE ORTHO SPLIT 77X108 STRL (DRAPES) ×4
DRAPE SURG ORHT 6 SPLT 77X108 (DRAPES) IMPLANT
DRSG TEGADERM 4X10 (GAUZE/BANDAGES/DRESSINGS) IMPLANT
ELECT REM PT RETURN 9FT ADLT (ELECTROSURGICAL) ×2
ELECTRODE REM PT RTRN 9FT ADLT (ELECTROSURGICAL) ×3 IMPLANT
GAUZE 4X4 16PLY ~~LOC~~+RFID DBL (SPONGE) ×3 IMPLANT
GAUZE SPONGE 2X2 8PLY STRL LF (GAUZE/BANDAGES/DRESSINGS) IMPLANT
GAUZE SPONGE 4X4 12PLY STRL (GAUZE/BANDAGES/DRESSINGS) IMPLANT
GLOVE SURG SS PI 7.5 STRL IVOR (GLOVE) ×9 IMPLANT
GOWN STRL REUS W/ TWL LRG LVL3 (GOWN DISPOSABLE) ×6 IMPLANT
GOWN STRL REUS W/ TWL XL LVL3 (GOWN DISPOSABLE) ×3 IMPLANT
GOWN STRL REUS W/TWL LRG LVL3 (GOWN DISPOSABLE) ×4
GOWN STRL REUS W/TWL XL LVL3 (GOWN DISPOSABLE) ×2
GRAFT GORETEX STRT 4-7X45 (Vascular Products) IMPLANT
HEMOSTAT SNOW SURGICEL 2X4 (HEMOSTASIS) IMPLANT
KIT BASIN OR (CUSTOM PROCEDURE TRAY) ×3 IMPLANT
KIT TURNOVER KIT B (KITS) ×3 IMPLANT
NDL 18GX1X1/2 (RX/OR ONLY) (NEEDLE) IMPLANT
NDL HYPO 25GX1X1/2 BEV (NEEDLE) ×3 IMPLANT
NEEDLE 18GX1X1/2 (RX/OR ONLY) (NEEDLE) ×2 IMPLANT
NEEDLE HYPO 25GX1X1/2 BEV (NEEDLE) ×2 IMPLANT
NS IRRIG 1000ML POUR BTL (IV SOLUTION) ×3 IMPLANT
PACK PERIPHERAL VASCULAR (CUSTOM PROCEDURE TRAY) IMPLANT
PAD ARMBOARD 7.5X6 YLW CONV (MISCELLANEOUS) ×6 IMPLANT
PENCIL BUTTON HOLSTER BLD 10FT (ELECTRODE) IMPLANT
SOAP 2 % CHG 4 OZ (WOUND CARE) ×3 IMPLANT
SURGIFLO W/THROMBIN 8M KIT (HEMOSTASIS) IMPLANT
SUT ETHILON 3 0 PS 1 (SUTURE) ×3 IMPLANT
SUT MNCRL AB 4-0 PS2 18 (SUTURE) IMPLANT
SUT PROLENE 5 0 C 1 24 (SUTURE) IMPLANT
SUT PROLENE 6 0 BV (SUTURE) ×3 IMPLANT
SUT SILK 2 0 SH (SUTURE) IMPLANT
SUT VIC AB 2-0 CT1 27 (SUTURE) ×2
SUT VIC AB 2-0 CT1 TAPERPNT 27 (SUTURE) IMPLANT
SUT VIC AB 3-0 SH 27 (SUTURE) ×4
SUT VIC AB 3-0 SH 27X BRD (SUTURE) ×3 IMPLANT
SUT VICRYL 4-0 PS2 18IN ABS (SUTURE) ×3 IMPLANT
SYR 10ML LL (SYRINGE) IMPLANT
SYR 20ML LL LF (SYRINGE) IMPLANT
SYR 5ML LL (SYRINGE) IMPLANT
SYR CONTROL 10ML LL (SYRINGE) ×3 IMPLANT
TOWEL GREEN STERILE (TOWEL DISPOSABLE) ×3 IMPLANT
UNDERPAD 30X36 HEAVY ABSORB (UNDERPADS AND DIAPERS) ×3 IMPLANT
WATER STERILE IRR 1000ML POUR (IV SOLUTION) ×3 IMPLANT

## 2022-09-25 NOTE — Discharge Instructions (Signed)
° °  [facility] ° °Discharge Instructions ° °AV Fistula or Graft Surgery for Dialysis Access ° °Please refer to the following instructions for your post-procedure care. Your surgeon or physician assistant will discuss any changes with you. ° °Activity ° °You may drive the day following your surgery, if you are comfortable and no longer taking prescription pain medication. Resume full activity as the soreness in your incision resolves. ° °Bathing/Showering ° °You may shower after you go home. Keep your incision dry for 48 hours. Do not soak in a bathtub, hot tub, or swim until the incision heals completely. You may not shower if you have a hemodialysis catheter. ° °Incision Care ° °Clean your incision with mild soap and water after 48 hours. Pat the area dry with a clean towel. You do not need a bandage unless otherwise instructed. Do not apply any ointments or creams to your incision. You may have skin glue on your incision. Do not peel it off. It will come off on its own in about one week. Your arm may swell a bit after surgery. To reduce swelling use pillows to elevate your arm so it is above your heart. Your doctor will tell you if you need to lightly wrap your arm with an ACE bandage. ° °Diet ° °Resume your normal diet. There are not special food restrictions following this procedure. In order to heal from your surgery, it is CRITICAL to get adequate nutrition. Your body requires vitamins, minerals, and protein. Vegetables are the best source of vitamins and minerals. Vegetables also provide the perfect balance of protein. Processed food has little nutritional value, so try to avoid this. ° °Medications ° °Resume taking all of your medications. If your incision is causing pain, you may take over-the counter pain relievers such as acetaminophen (Tylenol). If you were prescribed a stronger pain medication, please be aware these medications can cause nausea and constipation. Prevent  nausea by taking the medication with a snack or meal. Avoid constipation by drinking plenty of fluids and eating foods with high amount of fiber, such as fruits, vegetables, and grains. Do not take Tylenol if you are taking prescription pain medications. ° ° ° ° °Follow up °Your surgeon may want to see you in the office following your access surgery. If so, this will be arranged at the time of your surgery. ° °Please call us immediately for any of the following conditions: ° °Increased pain, redness, drainage (pus) from your incision site °Fever of 101 degrees or higher °Severe or worsening pain at your incision site °Hand pain or numbness. ° °Reduce your risk of vascular disease: ° °Stop smoking. If you would like help, call [facility] at 1-800-QUIT-NOW (1-800-784-8669) or [facility] at 336-586-4000 ° °Manage your cholesterol °Maintain a desired weight °Control your diabetes °Keep your blood pressure down ° °Dialysis ° °It will take several weeks to several months for your new dialysis access to be ready for use. Your surgeon will determine when it is OK to use it. Your nephrologist will continue to direct your dialysis. You can continue to use your Permcath until your new access is ready for use. ° °If you have any questions, please call the office at [phone]. ° °

## 2022-09-25 NOTE — Anesthesia Procedure Notes (Signed)
Procedure Name: LMA Insertion Date/Time: 09/25/2022 7:53 AM  Performed by: Rosiland Oz, CRNAPre-anesthesia Checklist: Patient identified, Emergency Drugs available, Suction available, Patient being monitored and Timeout performed Patient Re-evaluated:Patient Re-evaluated prior to induction Oxygen Delivery Method: Circle system utilized Preoxygenation: Pre-oxygenation with 100% oxygen Induction Type: IV induction LMA: LMA inserted LMA Size: 3.0 Number of attempts: 1 Placement Confirmation: positive ETCO2 and breath sounds checked- equal and bilateral Tube secured with: Tape Dental Injury: Teeth and Oropharynx as per pre-operative assessment

## 2022-09-25 NOTE — Op Note (Signed)
Patient name: [name] MRN: [id number] DOB: 08-Jun-1975 Sex: female  09/25/2022 Pre-operative Diagnosis: ESRD Post-operative diagnosis:  Same Surgeon:  Durene Cal Assistants:  Aggie Moats, PA Procedure:   #1: Left femoral loop graft (4 x 7 PTFE)   #2: Removal of hero catheter Anesthesia:  General Blood Loss:  minimal Specimens:  none  Findings: I used a 4 x 7 PTFE graft.  The anastomosis was to the left common femoral artery and the left common femoral vein.  Indications: This is a 47 year old female in need of new access.  She has a retained hero catheter.  This was removed.  The graft and the upper shoulder area was left but the catheter portion going into the right atrium was removed in its entirety as well as the metal connector  Procedure:  The patient was identified in the holding area and taken to [facility] OR ROOM 16  The patient was then placed supine on the table. general anesthesia was administered.  The patient was prepped and draped in the usual sterile fashion.  A time out was called and antibiotics were administered.  A PA was necessary to expedite the procedure and assist with technical details.  He helped with suction and retraction for exposure.  He helped follow the suture for the anastomoses.  He help with wound closure.  We first began by making an incision in the left shoulder over top of the metal connector for the hero graft.  Cautery was used to fully mobilized the connector.  Once this was fully exposed, the graft portion was clamped and then we removed the central venous catheter portion.  This came out in its entirety.  We then transected the graft and remove the metal cough and catheter.  The PA help with closure of the graft portion by oversewing this with 5-0 Prolene as well as a silk tie.  This incision was closed with a deep layer 3-0 Vicryl and 4-0 subcuticular sutures.  I then made an oblique incision in the left groin.  Cautery was used divide  subcutaneous tissue down to the femoral sheath.  I exposed the common femoral artery which was a 5 mm disease-free artery.  I then exposed the common femoral vein.  There were 2 branches that were ligated between silk ties.  I then infiltrated the anticipated area for the tunnel with Exparel.  I made a counterincision.  A tunnel was then created with a curved Gore tunneler.  The graft was brought through the tunnel.  The femoral artery was occluded with vascular clamps and a #11 blade was used to make an arteriotomy which was extended longitudinally with Potts scissors.  The 4 mm end of the graft was spatulated to fit the size the arteriotomy in a running anastomosis was created with 6-0 Prolene.  Prior to completion, the appropriate flushing maneuvers were performed and the anastomosis was completed.  There was excellent flow through the graft which was flushed with heparin saline and reoccluded.  I then occluded the femoral vein with vascular clamps and made a venotomy with a #11 blade which was extended longitudinally with Potts scissors.  The graft was then cut to the appropriate length and beveled to fit the size of the venotomy.  A running anastomosis was created with 6-0 Prolene.  Prior to completion the appropriate flushing maneuvers were performed and the anastomosis was completed.  There was an excellent thrill within the graft.  The wound was irrigated.  Hemostasis was achieved.  The counterincision in the left thigh was closed with a deep layer 3-0 Vicryl followed by subcuticular closure.  The groin incision was closed by reapproximating the deep tissue with 2-0 Vicryl.  The subcutaneous tissue was closed with 3-0 Vicryl followed by subcuticular closure.  Dermabond was applied.  There were no immediate complications.  She was successfully extubated and taken recovery in stable condition.   Disposition: To PACU stable.   Juleen China, M.D., [facility]  [place] Office: [phone] Pager:  [phone]

## 2022-09-25 NOTE — H&P (Signed)
   Patient name: [name] MRN: [id number] DOB: 11-08-1975 Sex: female    HISTORY OF PRESENT ILLNESS:   [name] is a 47 y.o. female with ESRD in need of new access  CURRENT MEDICATIONS:    Current Facility-Administered Medications  Medication Dose Route Frequency Provider Last Rate Last Admin   0.9 %  sodium chloride infusion   Intravenous Continuous Chamya Hunton, Fran Lowes, MD       0.9 %  sodium chloride infusion   Intravenous Continuous Battula, Rajamani C, MD       ceFAZolin (ANCEF) IVPB 2g/100 mL premix  2 g Intravenous 30 min Pre-Op Nada Libman, MD       chlorhexidine (HIBICLENS) 4 % liquid 4 Application  60 mL Topical Once Nada Libman, MD       And   [START ON 09/26/2022] chlorhexidine (HIBICLENS) 4 % liquid 4 Application  60 mL Topical Once Nada Libman, MD       chlorhexidine (PERIDEX) 0.12 % solution 15 mL  15 mL Mouth/Throat Once Battula, Levada Dy, MD       Or   Oral care mouth rinse  15 mL Mouth Rinse Once Battula, Rajamani C, MD        REVIEW OF SYSTEMS:   [X]  denotes positive finding, [ ]  denotes negative finding Cardiac  Comments:  Chest pain or chest pressure:    Shortness of breath upon exertion:    Short of breath when lying flat:    Irregular heart rhythm:    Constitutional    Fever or chills:      PHYSICAL EXAM:   Vitals:   09/25/22 0604  BP: 98/65  Pulse: (!) 56  Resp: 17  Temp: 98.6 F (37 C)  TempSrc: Oral  SpO2: 100%  Weight: 49.9 kg  Height: 5\' 3"  (1.6 m)    GENERAL: The patient is a well-nourished female, in no acute distress. The vital signs are documented above. CARDIOVASCULAR: There is a regular rate and rhythm. PULMONARY: Non-labored respirations   STUDIES:     MEDICAL ISSUES:   Plan for new left femoral AVGG and removal of retained left arm HeRO  Charlena Cross, MD, FACS [facility] [phone] Pager [phone]

## 2022-09-25 NOTE — Transfer of Care (Signed)
Immediate Anesthesia Transfer of Care Note  Patient: [name]  Procedure(s) Performed: INSERTION OF LEFT FEMORAL LOOP GRAFT USING 4-66mm GORETEX STRETCH GRAFT (Left: Leg Upper) REMOVAL OF LEFT INTERNAL JUGULAR HERO CATHETER (Left: Arm Upper)  Patient Location: PACU  Anesthesia Type:General  Level of Consciousness: drowsy and patient cooperative  Airway & Oxygen Therapy: Patient Spontanous Breathing  Post-op Assessment: Report given to RN and Post -op Vital signs reviewed and stable  Post vital signs: Reviewed and stable  Last Vitals:  Vitals Value Taken Time  BP 121/70 09/25/22 0936  Temp    Pulse 62 09/25/22 0937  Resp 12 09/25/22 0938  SpO2 100 % 09/25/22 0937  Vitals shown include unfiled device data.  Last Pain:  Vitals:   09/25/22 0646  TempSrc:   PainSc: 0-No pain         Complications: No notable events documented.

## 2022-09-26 ENCOUNTER — Encounter (HOSPITAL_COMMUNITY): Payer: Self-pay | Admitting: Surgery

## 2022-09-26 NOTE — Anesthesia Postprocedure Evaluation (Signed)
Anesthesia Post Note  Patient: [name]  Procedure(s) Performed: INSERTION OF LEFT FEMORAL LOOP GRAFT USING 4-63mm GORETEX STRETCH GRAFT (Left: Leg Upper) REMOVAL OF LEFT INTERNAL JUGULAR HERO CATHETER (Left: Arm Upper)     Patient location during evaluation: PACU Anesthesia Type: General Level of consciousness: sedated and patient cooperative Pain management: pain level controlled Vital Signs Assessment: post-procedure vital signs reviewed and stable Respiratory status: spontaneous breathing Cardiovascular status: stable Anesthetic complications: no   No notable events documented.  Last Vitals:  Vitals:   09/25/22 1015 09/25/22 1030  BP: (!) 80/58 (!) 94/56  Pulse:    Resp: 16 20  Temp:  (!) 36.3 C  SpO2: 100% 98%    Last Pain:  Vitals:   09/25/22 1030  TempSrc:   PainSc: 0-No pain                 Lewie Loron

## 2022-10-09 ENCOUNTER — Encounter (HOSPITAL_COMMUNITY): Payer: Self-pay

## 2022-10-09 ENCOUNTER — Ambulatory Visit (HOSPITAL_COMMUNITY)
Admission: RE | Admit: 2022-10-09 | Discharge: 2022-10-09 | Disposition: A | Discharge status: Home / Self Care | Payer: Medicare Other | Source: Ambulatory Visit | Attending: Internal Medicine | Admitting: Internal Medicine

## 2022-10-09 DIAGNOSIS — Z1231 Encounter for screening mammogram for malignant neoplasm of breast: Secondary | ICD-10-CM | POA: Diagnosis present

## 2022-11-12 NOTE — Progress Notes (Unsigned)
POST OPERATIVE OFFICE NOTE    CC:  F/u for surgery  HPI:  This is a 47 y.o. female who is s/p left thigh AVG and removal of hero catheter on 09/25/2022 by Dr. Myra Gianotti.    Pt returns today for follow up.  Pt states she is doing well.  She has a catheter in the right groin that she is using for dialysis.  She tells me that this was put in at  [facility].  She denies any pain in her left foot.  Her left thigh AVG has not been accessed yet.    Allergies  Allergen Reactions   Dust Mite Extract Other (See Comments)    sneezing    Current Outpatient Medications  Medication Sig Dispense Refill   albuterol (PROVENTIL) (2.5 MG/3ML) 0.083% nebulizer solution Take 2.5 mg by nebulization 4 (four) times daily as needed for wheezing or shortness of breath.      ALPRAZolam (XANAX) 0.25 MG tablet Take 0.25 mg by mouth 2 (two) times daily.     aspirin EC 81 MG tablet Take 81 mg by mouth daily.     Calcium Carbonate Antacid (TUMS PO) Take 1,000 mg by mouth 2 (two) times daily between meals.     chlorhexidine (PERIDEX) 0.12 % solution Use as directed 15 mLs in the mouth or throat 2 (two) times daily. After regular toothbrushing, rinse mouth     clopidogrel (PLAVIX) 75 MG tablet Take 75 mg by mouth daily.     cycloSPORINE (RESTASIS) 0.05 % ophthalmic emulsion Place 1 drop into both eyes 2 (two) times daily.     docusate sodium (COLACE) 100 MG capsule Take 100 mg by mouth daily.     epoetin alfa (EPOGEN,PROCRIT) 4000 UNIT/ML injection Inject 4,000 Units into the vein See admin instructions. Mon, Wed, and Friday at dialysis     guaiFENesin (ROBITUSSIN) 100 MG/5ML liquid Take 200 mg by mouth every 6 (six) hours as needed for cough.      hydroxychloroquine (PLAQUENIL) 200 MG tablet Take 200 mg by mouth daily.     ipratropium (ATROVENT) 0.02 % nebulizer solution Take 0.5 mg by nebulization 4 (four) times daily as needed for wheezing or shortness of breath (For Breathing).     lidocaine-prilocaine (EMLA) cream  Apply 1 application topically as needed (port access).      loratadine (CLARITIN) 10 MG tablet Take 10 mg by mouth daily as needed for allergies.     midodrine (PROAMATINE) 10 MG tablet Take 10 mg by mouth 2 (two) times daily.      mirtazapine (REMERON) 15 MG tablet Take 7.5 mg by mouth at bedtime.      multivitamin (RENA-VIT) TABS tablet Take 1 tablet by mouth daily.     oxyCODONE-acetaminophen (PERCOCET) 5-325 MG tablet Take 1 tablet by mouth every 6 (six) hours as needed for severe pain. 20 tablet 0   pantoprazole (PROTONIX) 40 MG tablet Take 40 mg by mouth daily.     phenytoin (DILANTIN) 100 MG ER capsule Take 100 mg by mouth 2 (two) times daily.      sertraline (ZOLOFT) 100 MG tablet Take 200 mg by mouth daily.      simvastatin (ZOCOR) 40 MG tablet Take 40 mg by mouth at bedtime.     Current Facility-Administered Medications  Medication Dose Route Frequency Provider Last Rate Last Admin   0.9 %  sodium chloride infusion  250 mL Intravenous PRN Nada Libman, MD       sodium chloride  flush (NS) 0.9 % injection 3 mL  3 mL Intravenous Q12H Nada Libman, MD         ROS:  See HPI  Physical Exam:  Today's Vitals   11/13/22 1414  BP: (!) 89/58  Pulse: 88  Resp: 18  Temp: 99.1 F (37.3 C)  TempSrc: Temporal  SpO2: 98%  Weight: 127 lb 6.4 oz (57.8 kg)  Height: 5\' 3"  (1.6 m)   Body mass index is 22.57 kg/m.   Incision:  left groin incision has healed nicely Extremities:  palpable left DP/PT pulses; TDC in right groin; AVG with + thrill and easily palpable.     Assessment/Plan:  This is a 47 y.o. female who is s/p: left thigh AVG and removal of hero catheter on 09/25/2022 by Dr. Myra Gianotti.   -left groin incision has healed.  Her thigh AVG has good thrill and easily palpable.  She does have palpable DP/PT pulses on the left.   -okay to access left thigh AVG now.  Once happy with graft, they can schedule with [facility] to remove TDC. -she will f/u as needed. -discussed  that access does not last forever and may need intervention or new access at some point in the future.  She expressed understanding.   Doreatha Massed, [facility] [phone]   Clinic MD:  Edilia Bo

## 2022-11-13 ENCOUNTER — Ambulatory Visit (INDEPENDENT_AMBULATORY_CARE_PROVIDER_SITE_OTHER): Payer: Medicare Other | Admitting: Physician Assistant

## 2022-11-13 VITALS — BP 89/58 | HR 88 | Temp 99.1°F | Resp 18 | Ht 63.0 in | Wt 127.4 lb

## 2022-11-13 DIAGNOSIS — N186 End stage renal disease: Secondary | ICD-10-CM

## 2022-12-22 ENCOUNTER — Other Ambulatory Visit (HOSPITAL_COMMUNITY): Payer: Self-pay | Admitting: Nurse Practitioner

## 2022-12-22 DIAGNOSIS — N186 End stage renal disease: Secondary | ICD-10-CM

## 2023-01-06 ENCOUNTER — Ambulatory Visit (HOSPITAL_COMMUNITY)
Admission: RE | Admit: 2023-01-06 | Discharge: 2023-01-06 | Disposition: A | Discharge status: Home / Self Care | Payer: Medicare Other | Source: Ambulatory Visit | Attending: Nurse Practitioner | Admitting: Nurse Practitioner

## 2023-01-06 DIAGNOSIS — N186 End stage renal disease: Secondary | ICD-10-CM | POA: Diagnosis not present

## 2023-01-06 DIAGNOSIS — Z452 Encounter for adjustment and management of vascular access device: Secondary | ICD-10-CM | POA: Diagnosis present

## 2023-01-06 HISTORY — PX: IR REMOVAL TUN CV CATH W/O FL: IMG2289

## 2023-01-06 MED ORDER — LIDOCAINE HCL 1 % IJ SOLN
INTRAMUSCULAR | Status: AC
  Filled 2023-01-06: qty 20

## 2023-01-06 MED ORDER — LIDOCAINE HCL 1 % IJ SOLN
10.0000 mL | Freq: Once | INTRAMUSCULAR | Status: AC
  Administered 2023-01-06: 10 mL via INTRADERMAL

## 2023-04-23 ENCOUNTER — Ambulatory Visit (INDEPENDENT_AMBULATORY_CARE_PROVIDER_SITE_OTHER): Payer: 59 | Admitting: Podiatry

## 2023-04-23 ENCOUNTER — Encounter: Payer: Self-pay | Admitting: Podiatry

## 2023-04-23 VITALS — Ht 63.0 in

## 2023-04-23 DIAGNOSIS — M24572 Contracture, left ankle: Secondary | ICD-10-CM

## 2023-04-23 DIAGNOSIS — B351 Tinea unguium: Secondary | ICD-10-CM

## 2023-04-23 DIAGNOSIS — L84 Corns and callosities: Secondary | ICD-10-CM

## 2023-04-23 DIAGNOSIS — M79674 Pain in right toe(s): Secondary | ICD-10-CM | POA: Diagnosis not present

## 2023-04-23 DIAGNOSIS — M79675 Pain in left toe(s): Secondary | ICD-10-CM

## 2023-04-23 MED ORDER — AMMONIUM LACTATE 12 % EX LOTN
1.0000 | TOPICAL_LOTION | CUTANEOUS | 0 refills | Status: AC | PRN
Start: 2023-04-23 — End: ?

## 2023-04-23 NOTE — Patient Instructions (Signed)
 Look for urea 40% cream or ointment and apply to the thickened dry skin / calluses. This can be bought over the counter, at a pharmacy or online such as [employer].  May look for combination product with salicylic acid.  AmLactin or Lac-Hydrin  are creams that are available with lower concentrations of urea that can also be applied to the calluses.  Can also scrub these areas with white vinegar to make it easier to manage.  For leg swelling, recommend the use of over-the-counter compression stockings.  Return in 3 months for nail trim.

## 2023-04-23 NOTE — Progress Notes (Signed)
  Subjective:  Patient ID: [name], female    DOB: Nov 02, 1975,  MRN: [id number]  Chief Complaint  Patient presents with   RFC    She is here for a nail and callous trim, She has noticed some swelling in the legs and affecting her feet from Dialysis     48 y.o. female presents with the above complaint. History confirmed with patient. Patient presenting with pain related to dystrophic thickened elongated nails. Patient is unable to trim own nails related to nail dystrophy and/or mobility issues. Patient does not have a history of T2DM. Patient does have callus present located at the left subfirst and subthird metatarsal head causing pain.  She does relate left lower extremity deformity secondary to dialysis access placed in the left leg and uses a walker to ambulate.  She does present from facility.  Objective:  Physical Exam: warm, good capillary refill nail exam onychomycosis of the toenails, onycholysis, and dystrophic nails DP pulses palpable, PT pulses palpable, and protective sensation intact Left Foot:  Pain with palpation of nails due to elongation and dystrophic growth.  Hyperkeratotic lesion subfirst and subthird metatarsal head, preulcerative in nature.  Equinus contracture of left ankle, unable to dorsiflex the foot to neutral with passive or active range of motion. Does appear to have rigid end point. Right Foot: Pain with palpation of nails due to elongation and dystrophic growth.   Assessment:   1. Pain due to onychomycosis of toenails of both feet   2. Pre-ulcerative calluses   3. Equinus contracture of left ankle      Plan:  Patient was evaluated and treated and all questions answered.  #Hyperkeratotic lesions/pre ulcerative calluses present left sub 1st and sub 3rd met head All symptomatic hyperkeratoses x 2 separate lesions were safely debrided as a courtesy with a sterile #312 blade to patient's level of comfort without incident. We discussed preventative and  palliative care of these lesions including supportive and accommodative shoegear, padding, prefabricated and custom molded accommodative orthoses, use of a pumice stone and lotions/creams daily. Did discuss that unfortunately would likely need an ABN for this in the future. -AmLactin cream 12% to be applied to the dry skin and calluses as needed  # Left foot equinus contracture of ankle -Discussed with patient that this is contributing to the formation of callus left forefoot. -Will obtain x-rays at follow-up -Does ambulate with a walker due to this -Patient believes it is secondary to dialysis access which was placed in the left leg over a year ago.  #Onychomycosis with pain  -Nails palliatively debrided as below. -Educated on self-care  Procedure: Nail Debridement Rationale: Pain Type of Debridement: manual, sharp debridement. Instrumentation: Nail nipper, rotary burr. Number of Nails: 10  Return in about 3 months (around 07/21/2023) for Routine Foot Care.         Ethan Saddler, DPM [facility] / [facility]

## 2023-05-04 ENCOUNTER — Other Ambulatory Visit (HOSPITAL_COMMUNITY): Payer: Self-pay | Admitting: Nurse Practitioner

## 2023-05-04 DIAGNOSIS — N186 End stage renal disease: Secondary | ICD-10-CM

## 2023-05-11 ENCOUNTER — Other Ambulatory Visit (HOSPITAL_COMMUNITY): Payer: Self-pay | Admitting: Radiology

## 2023-05-11 DIAGNOSIS — N186 End stage renal disease: Secondary | ICD-10-CM

## 2023-05-12 ENCOUNTER — Other Ambulatory Visit (HOSPITAL_COMMUNITY): Payer: Self-pay | Admitting: Nurse Practitioner

## 2023-05-12 ENCOUNTER — Other Ambulatory Visit: Payer: Self-pay

## 2023-05-12 ENCOUNTER — Encounter (HOSPITAL_COMMUNITY): Payer: Self-pay | Admitting: Nurse Practitioner

## 2023-05-12 ENCOUNTER — Ambulatory Visit (HOSPITAL_COMMUNITY)
Admission: RE | Admit: 2023-05-12 | Discharge: 2023-05-12 | Disposition: A | Discharge status: Home / Self Care | Payer: 59 | Source: Ambulatory Visit | Attending: Nurse Practitioner | Admitting: Nurse Practitioner

## 2023-05-12 ENCOUNTER — Encounter (HOSPITAL_COMMUNITY): Payer: Self-pay

## 2023-05-12 VITALS — BP 132/74 | HR 83 | Temp 98.2°F | Resp 17 | Ht 63.0 in | Wt 127.9 lb

## 2023-05-12 DIAGNOSIS — Z992 Dependence on renal dialysis: Secondary | ICD-10-CM | POA: Diagnosis not present

## 2023-05-12 DIAGNOSIS — N186 End stage renal disease: Secondary | ICD-10-CM

## 2023-05-12 DIAGNOSIS — Y832 Surgical operation with anastomosis, bypass or graft as the cause of abnormal reaction of the patient, or of later complication, without mention of misadventure at the time of the procedure: Secondary | ICD-10-CM | POA: Diagnosis not present

## 2023-05-12 DIAGNOSIS — I120 Hypertensive chronic kidney disease with stage 5 chronic kidney disease or end stage renal disease: Secondary | ICD-10-CM | POA: Insufficient documentation

## 2023-05-12 DIAGNOSIS — F17210 Nicotine dependence, cigarettes, uncomplicated: Secondary | ICD-10-CM | POA: Diagnosis not present

## 2023-05-12 DIAGNOSIS — T82858A Stenosis of vascular prosthetic devices, implants and grafts, initial encounter: Secondary | ICD-10-CM | POA: Insufficient documentation

## 2023-05-12 HISTORY — PX: IR US GUIDE VASC ACCESS LEFT: IMG2389

## 2023-05-12 HISTORY — PX: IR AV DIALY SHUNT INTRO NEEDLE/INTRACATH INITIAL W/PTA/IMG LEFT: IMG6103

## 2023-05-12 LAB — BASIC METABOLIC PANEL
Anion gap: 8 (ref 5–15)
BUN: 19 mg/dL (ref 6–20)
CO2: 31 mmol/L (ref 22–32)
Calcium: 9.8 mg/dL (ref 8.9–10.3)
Chloride: 100 mmol/L (ref 98–111)
Creatinine, Ser: 6.62 mg/dL — ABNORMAL HIGH (ref 0.44–1.00)
GFR, Estimated: 7 mL/min — ABNORMAL LOW (ref >60)
Glucose, Bld: 86 mg/dL (ref 70–99)
Potassium: 4.4 mmol/L (ref 3.5–5.1)
Sodium: 139 mmol/L (ref 135–145)

## 2023-05-12 LAB — HCG, QUANTITATIVE, PREGNANCY: hCG, Beta Chain, Quant, S: 10 m[IU]/mL — ABNORMAL HIGH (ref <5)

## 2023-05-12 MED ORDER — MIDAZOLAM HCL 2 MG/2ML IJ SOLN
INTRAMUSCULAR | Status: AC | PRN
  Administered 2023-05-12: 1 mg via INTRAVENOUS

## 2023-05-12 MED ORDER — FENTANYL CITRATE (PF) 100 MCG/2ML IJ SOLN
INTRAMUSCULAR | Status: AC
  Filled 2023-05-12: qty 2

## 2023-05-12 MED ORDER — MIDAZOLAM HCL 2 MG/2ML IJ SOLN
INTRAMUSCULAR | Status: AC
  Filled 2023-05-12: qty 2

## 2023-05-12 MED ORDER — IOHEXOL 300 MG/ML  SOLN
100.0000 mL | Freq: Once | INTRAMUSCULAR | Status: AC | PRN
  Administered 2023-05-12: 25 mL via INTRAVENOUS

## 2023-05-12 MED ORDER — FENTANYL CITRATE (PF) 100 MCG/2ML IJ SOLN
INTRAMUSCULAR | Status: AC | PRN
  Administered 2023-05-12: 50 ug via INTRAVENOUS

## 2023-05-12 MED ORDER — SODIUM CHLORIDE 0.9 % IV SOLN: INTRAVENOUS | Status: DC

## 2023-05-12 MED ORDER — LIDOCAINE-EPINEPHRINE 1 %-1:100000 IJ SOLN
INTRAMUSCULAR | Status: AC
  Filled 2023-05-12: qty 1

## 2023-05-12 NOTE — Progress Notes (Signed)
 Unable to get ahold of anyone from [facility] to ask about PTA meds. Brayton El, PA informed.

## 2023-05-12 NOTE — H&P (Signed)
 Chief Complaint: Patient was seen in consultation today for left thigh shuntogram   Referring Physician(s): Hyler,Rachel H  Supervising Physician: Gilmer Mor  Patient Status: [facility] - Out-pt  History of Present Illness: [name] is a 48 y.o. female with ESRD on HD. She has left thigh femoral loop AVG that was created 09/25/2022. This has been in use but recently she's developed some swelling in her thigh near her access sites.  She is referred for shuntogram.  PMHx, meds, labs, imaging, allergies reviewed. Feels well, no recent fevers, chills, illness. Has been NPO today as directed.    Past Medical History:  Diagnosis Date   Anemia    BV (bacterial vaginosis) 11/08/2013   Depression    Dialysis patient [facility])    DVT (deep venous thrombosis) (HCC)    Hypertension    Lupus    Renal disorder    Vaginal odor 11/08/2013    Past Surgical History:  Procedure Laterality Date   A/V SHUNT INTERVENTION N/A 10/24/2016   Procedure: A/V SHUNT INTERVENTION;  Surgeon: Renford Dills, MD;  Location: [facility] INVASIVE CV LAB;  Service: Cardiovascular;  Laterality: N/A;   A/V SHUNTOGRAM Left 12/08/2017   Procedure: A/V SHUNTOGRAM;  Surgeon: Renford Dills, MD;  Location: [facility] INV[facility] CV [facility];  Service: Cardiovascular;  Laterality: Left;   A/V SHUNTOGRAM Left 01/28/2018   Procedure: A/V SHUNTOGRAM;  Surgeon: Annice Needy, MD;  Location: [facility] INV[facility] CV [facility];  Service: Cardiovascular;  Laterality: Left;   A/V SHUNTOGRAM Left 04/01/2018   Procedure: A/V SHUNTOGRAM;  Surgeon: Annice Needy, MD;  Location: [facility];  Service: Cardiovascular;  Laterality: Left;   A/V SHUNTOGRAM Left 04/28/2019   Procedure: A/V SHUNTOGRAM;  Surgeon: Annice Needy, MD;  Location: [facility];  Service: Cardiovascular;  Laterality: Left;   AV FISTULA PLACEMENT     AV FISTULA PLACEMENT Left 02/09/2022   Procedure: FISTULAGRAM, placement of left arm stent.;  Surgeon: Nada Libman, MD;  Location: [facility] OR;  Service: Vascular;  Laterality: Left;   AV FISTULA PLACEMENT Right 09/02/2022   Procedure: EXPLORATION OF RIGHT AXILLA, INADEQUATE VEIN ACCESS;  Surgeon: Larina Earthly, MD;  Location: [facility] ORS;  Service: Vascular;  Laterality: Right;   AV FISTULA PLACEMENT Left 09/25/2022   Procedure: INSERTION OF LEFT FEMORAL LOOP GRAFT USING 4-60mm GORETEX STRETCH GRAFT;  Surgeon: Nada Libman, MD;  Location: [facility] OR;  Service: Vascular;  Laterality: Left;   DIALYSIS/PERMA CATHETER INSERTION  10/23/2016   Procedure: DIALYSIS/PERMA CATHETER INSERTION;  Surgeon: Annice Needy, MD;  Location: [facility];  Service: Cardiovascular;;   IR REMOVAL TUN CV CATH W/O FL  09/22/2017   IR REMOVAL TUN CV CATH W/O FL  01/06/2023   IR THROMBECTOMY AV FISTULA W/THROMBOLYSIS INC/SHUNT/IMG LEFT Left 09/04/2017   IR US GUIDE VASC ACCESS LEFT  09/04/2017   MASS EXCISION Left 06/01/2018   Procedure: EXCISION 5 X 3CM LIPOMA LEFT THIGH;  Surgeon: Franky Macho, MD;  Location: [facility] ORS;  Service: General;  Laterality: Left;   PERIPHERAL VASCULAR CATHETERIZATION Left 01/04/2015   Procedure: A/V Shuntogram/Fistulagram;  Surgeon: Annice Needy, MD;  Location: [facility];  Service: Cardiovascular;  Laterality: Left;   PERIPHERAL VASCULAR CATHETERIZATION N/A 01/04/2015   Procedure: A/V Shunt Intervention;  Surgeon: Annice Needy, MD;  Location: [facility];  Service: Cardiovascular;  Laterality: N/A;   PERIPHERAL VASCULAR CATHETERIZATION Left 04/13/2015   Procedure: A/V Shuntogram/Fistulagram;  Surgeon:  Renford Dills, MD;  Location: [facility];  Service: Cardiovascular;  Laterality: Left;   PERIPHERAL VASCULAR CATHETERIZATION Left 04/13/2015   Procedure: A/V Shunt Intervention;  Surgeon: Renford Dills, MD;  Location: [facility];  Service: Cardiovascular;  Laterality: Left;   REMOVAL OF A DIALYSIS CATHETER Left 09/25/2022   Procedure: REMOVAL OF LEFT INTERNAL JUGULAR HERO  CATHETER;  Surgeon: Nada Libman, MD;  Location: [facility] OR;  Service: Vascular;  Laterality: Left;   UPPER EXTREMITY VENOGRAPHY N/A 08/26/2022   Procedure: UPPER EXTREMITY VENOGRAPHY;  Surgeon: Nada Libman, MD;  Location: [facility];  Service: Cardiovascular;  Laterality: N/A;    Allergies: Dust mite extract  Medications: Prior to Admission medications   Medication Sig Start Date End Date Taking? Authorizing Provider  albuterol (PROVENTIL) (2.5 MG/3ML) 0.083% nebulizer solution Take 2.5 mg by nebulization 4 (four) times daily as needed for wheezing or shortness of breath.    Yes [provider]  ALPRAZolam (XANAX) 0.25 MG tablet Take 0.25 mg by mouth 2 (two) times daily.    [provider]  ammonium lactate (AMLACTIN DAILY) 12 % lotion Apply 1 Application topically as needed for dry skin. 04/23/23   Barbaraann Share, DPM  aspirin EC 81 MG tablet Take 81 mg by mouth daily.    [provider]  Calcium Carbonate Antacid (TUMS PO) Take 1,000 mg by mouth 2 (two) times daily between meals. 06/13/22   [provider]  chlorhexidine (PERIDEX) 0.12 % solution Use as directed 15 mLs in the mouth or throat 2 (two) times daily. After regular toothbrushing, rinse mouth 08/20/22   [provider]  clopidogrel (PLAVIX) 75 MG tablet Take 75 mg by mouth daily.    [provider]  cycloSPORINE (RESTASIS) 0.05 % ophthalmic emulsion Place 1 drop into both eyes 2 (two) times daily. 01/31/21   [provider]  docusate sodium (COLACE) 100 MG capsule Take 100 mg by mouth daily.    [provider]  epoetin alfa (EPOGEN,PROCRIT) 4000 UNIT/ML injection Inject 4,000 Units into the vein See admin instructions. Mon, Wed, and Friday at dialysis    [provider]  guaiFENesin (ROBITUSSIN) 100 MG/5ML liquid Take 200 mg by mouth every 6 (six) hours as needed for cough.     [provider]  hydroxychloroquine (PLAQUENIL) 200 MG tablet  Take 200 mg by mouth daily.    [provider]  ipratropium (ATROVENT) 0.02 % nebulizer solution Take 0.5 mg by nebulization 4 (four) times daily as needed for wheezing or shortness of breath (For Breathing).    [provider]  lidocaine-prilocaine (EMLA) cream Apply 1 application topically as needed (port access).  12/20/12   [provider]  loratadine (CLARITIN) 10 MG tablet Take 10 mg by mouth daily as needed for allergies.    [provider]  midodrine (PROAMATINE) 10 MG tablet Take 10 mg by mouth 2 (two) times daily.     [provider]  mirtazapine (REMERON) 15 MG tablet Take 7.5 mg by mouth at bedtime.  11/03/13   [provider]  multivitamin (RENA-VIT) TABS tablet Take 1 tablet by mouth daily.    [provider]  oxyCODONE-acetaminophen (PERCOCET) 5-325 MG tablet Take 1 tablet by mouth every 6 (six) hours as needed for severe pain. 09/25/22   Emilie Rutter, PA-C  pantoprazole (PROTONIX) 40 MG tablet Take 40 mg by mouth daily.    [provider]  phenytoin (DILANTIN) 100  MG ER capsule Take 100 mg by mouth 2 (two) times daily.     [provider]  sertraline (ZOLOFT) 100 MG tablet Take 200 mg by mouth daily.     [provider]  simvastatin (ZOCOR) 40 MG tablet Take 40 mg by mouth at bedtime.    [provider]     Family History  Problem Relation Age of Onset   Seizures Son    Kidney disease Mother        on dialysis   Lupus Mother     Social History   Socioeconomic History   Marital status: Single    Spouse name: Not on file   Number of children: Not on file   Years of education: Not on file   Highest education level: Not on file  Occupational History   Not on file  Tobacco Use   Smoking status: Every Day    Current packs/day: 0.10    Average packs/day: 0.1 packs/day for 15.0 years (1.5 ttl pk-yrs)    Types: Cigarettes    Passive exposure: Current   Smokeless tobacco:  Never  Vaping Use   Vaping status: Never Used  Substance and Sexual Activity   Alcohol use: No   Drug use: No   Sexual activity: Not Currently    Birth control/protection: None, Abstinence  Other Topics Concern   Not on file  Social History Narrative   Lives at [facility]   Social Drivers of Health   Financial Resource Strain: Low Risk  (07/01/2022)   Overall Financial Resource Strain (CARDIA)    Difficulty of Paying Living Expenses: Not hard at all  Food Insecurity: No Food Insecurity (07/01/2022)   Hunger Vital Sign    Worried About Running Out of Food in the Last Year: Never true    Ran Out of Food in the Last Year: Never true  Transportation Needs: No Transportation Needs (07/01/2022)   PRAPARE - Administrator, Civil Service (Medical): No    Lack of Transportation (Non-Medical): No  Physical Activity: Inactive (07/01/2022)   Exercise Vital Sign    Days of Exercise per Week: 0 days    Minutes of Exercise per Session: 0 min  Stress: No Stress Concern Present (07/01/2022)   [employer] of Occupational Health - Occupational Stress Questionnaire    Feeling of Stress : Not at all  Social Connections: Socially Isolated (07/01/2022)   Social Connection and Isolation Panel [NHANES]    Frequency of Communication with Friends and Family: Once a week    Frequency of Social Gatherings with Friends and Family: Once a week    Attends Religious Services: 1 to 4 times per year    Active Member of [employer] or Organizations: No    Attends Banker Meetings: Never    Marital Status: Never married    Review of Systems: A 12 point ROS discussed and pertinent positives are indicated in the HPI above.  All other systems are negative.  Review of Systems  Vital Signs: BP 125/75   Pulse 79   Temp 98.2 F (36.8 C) (Oral)   Resp 17   Ht 5\' 3"  (1.6 m)   Wt 127 lb 13.9 oz (58 kg)   LMP 04/13/2023 (Approximate)   SpO2 100%   BMI 22.65 kg/m   Physical  Exam Constitutional:      Appearance: She is not ill-appearing.  HENT:     Mouth/Throat:     Mouth: Mucous membranes  are moist.     Pharynx: Oropharynx is clear.  Cardiovascular:     Rate and Rhythm: Normal rate and regular rhythm.     Heart sounds: Normal heart sounds.  Pulmonary:     Effort: Pulmonary effort is normal. No respiratory distress.     Breath sounds: Normal breath sounds.  Musculoskeletal:     Comments: (L)thigh loop AVG palpable pulse.  Mildly edematous surrounding region of anterior thigh when compared to right leg. NT  Skin:    General: Skin is warm and dry.  Neurological:     General: No focal deficit present.     Mental Status: She is alert and oriented to person, place, and time.  Psychiatric:        Mood and Affect: Mood normal.        Thought Content: Thought content normal.     Imaging: No results found.  Labs:  CBC: Recent Labs    07/02/22 1352 08/26/22 1025 09/02/22 1130 09/25/22 0702  WBC 4.7  --   --   --   HGB 12.4 14.6 12.7 14.6  HCT 36.1 43.0 37.8 43.0  PLT 216  --   --   --     COAGS: Recent Labs    07/02/22 1352  INR 1.1  APTT 57*    BMP: Recent Labs    07/02/22 1352 08/26/22 1025 09/02/22 1130 09/25/22 0702  NA 135 135 134* 138  K 2.9* 3.6 4.0 4.3  CL 92* 95* 94* 98  CO2 31  --  24  --   GLUCOSE 108* 72 75 80  BUN 14 38* 45* 31*  CALCIUM 9.5  --  9.1  --   CREATININE 4.16* 6.80* 6.73* 6.60*  GFRNONAA 13*  --  7*  --     LIVER FUNCTION TESTS: No results for input(s): "BILITOT", "AST", "ALT", "ALKPHOS", "PROT", "ALBUMIN" in the last 8760 hours.   Assessment and Plan: ESRD on HD. Swelling at left femoral loop graft site. Plan for shuntogram Risks and benefits of shuntogram discussed with the patient including, but not limited to bleeding, infection, vascular injury, pulmonary embolism.  All of the patient's questions were answered, patient is agreeable to proceed. Consent signed and in  chart.    Electronically Signed: Brayton El, PA-C 05/12/2023, 1:45 PM   I spent a total of 20 minutes in face to face in clinical consultation, greater than 50% of which was counseling/coordinating care for shuntogram

## 2023-05-12 NOTE — Discharge Instructions (Signed)
 Activity Rest as told by your health care provider. Return to your normal activities as told by your health care provider. Ask your health care provider what activities are safe for you. May shower and remove bandage 24 hours after discharge. If you were given a sedative during the procedure, it can affect you for several hours. Do not drive or operate machinery until your health care provider says that it is safe. General instructions      Check your  insertion area every day for signs of infection. Check for: Redness, swelling, or pain. Fluid or blood. Warmth. Pus or a bad smell. Take over-the-counter and prescription medicines only as told by your health care provider. Contact a health care provider if: You have redness, swelling, warmth, pus, or pain around the insertion site.

## 2023-05-12 NOTE — Procedures (Signed)
 Interventional Radiology Procedure Note  Procedure:   US guided access left thigh loop graft.  Angiogram Treatment of CFV stenosis.    Findings: CFV stenosis ~80-90% Treated with 12mm x 40mm to less than 30% residual.  Complications: None  Recommendations:  - Ok to shower tomorrow - 1 hr dc home - routine wound care - ok to continue [facility] and antiplatelets  Signed,  Yvone Neu. Loreta Ave, DO, ABVM, RPVI

## 2023-07-23 ENCOUNTER — Ambulatory Visit: Payer: 59 | Admitting: Podiatry

## 2023-09-15 ENCOUNTER — Ambulatory Visit (HOSPITAL_COMMUNITY)
Admission: RE | Admit: 2023-09-15 | Discharge: 2023-09-15 | Disposition: A | Discharge status: Home / Self Care | Attending: Vascular Surgery | Admitting: Vascular Surgery

## 2023-09-15 ENCOUNTER — Encounter (HOSPITAL_COMMUNITY)
Admission: RE | Disposition: A | Discharge status: Home / Self Care | Payer: Self-pay | Source: Home / Self Care | Attending: Vascular Surgery

## 2023-09-15 ENCOUNTER — Other Ambulatory Visit: Payer: Self-pay

## 2023-09-15 DIAGNOSIS — N186 End stage renal disease: Secondary | ICD-10-CM

## 2023-09-15 DIAGNOSIS — T82838A Hemorrhage of vascular prosthetic devices, implants and grafts, initial encounter: Secondary | ICD-10-CM | POA: Insufficient documentation

## 2023-09-15 DIAGNOSIS — Y832 Surgical operation with anastomosis, bypass or graft as the cause of abnormal reaction of the patient, or of later complication, without mention of misadventure at the time of the procedure: Secondary | ICD-10-CM | POA: Insufficient documentation

## 2023-09-15 DIAGNOSIS — I120 Hypertensive chronic kidney disease with stage 5 chronic kidney disease or end stage renal disease: Secondary | ICD-10-CM | POA: Diagnosis not present

## 2023-09-15 DIAGNOSIS — F17210 Nicotine dependence, cigarettes, uncomplicated: Secondary | ICD-10-CM | POA: Insufficient documentation

## 2023-09-15 DIAGNOSIS — T82858A Stenosis of vascular prosthetic devices, implants and grafts, initial encounter: Secondary | ICD-10-CM

## 2023-09-15 DIAGNOSIS — Z992 Dependence on renal dialysis: Secondary | ICD-10-CM

## 2023-09-15 HISTORY — PX: VENOUS ANGIOPLASTY: CATH118376

## 2023-09-15 HISTORY — PX: A/V SHUNT INTERVENTION: CATH118220

## 2023-09-15 SURGERY — A/V SHUNT INTERVENTION
Anesthesia: LOCAL | Site: Thigh | Laterality: Left

## 2023-09-15 MED ORDER — LIDOCAINE HCL (PF) 1 % IJ SOLN
INTRAMUSCULAR | Status: DC | PRN
Start: 2023-09-15 — End: 2023-09-15
  Administered 2023-09-15: 5 mL

## 2023-09-15 MED ORDER — HEPARIN (PORCINE) IN NACL 1000-0.9 UT/500ML-% IV SOLN
INTRAVENOUS | Status: DC | PRN
Start: 2023-09-15 — End: 2023-09-15
  Administered 2023-09-15: 500 mL

## 2023-09-15 MED ORDER — LIDOCAINE HCL (PF) 1 % IJ SOLN
INTRAMUSCULAR | Status: AC
  Filled 2023-09-15: qty 30

## 2023-09-15 MED ORDER — HEPARIN SODIUM (PORCINE) 1000 UNIT/ML IJ SOLN
INTRAMUSCULAR | Status: DC | PRN
  Administered 2023-09-15: 2000 [IU] via INTRAVENOUS

## 2023-09-15 MED ORDER — HEPARIN SODIUM (PORCINE) 1000 UNIT/ML IJ SOLN
INTRAMUSCULAR | Status: AC
  Filled 2023-09-15: qty 10

## 2023-09-15 MED ORDER — IODIXANOL 320 MG/ML IV SOLN
INTRAVENOUS | Status: DC | PRN
  Administered 2023-09-15: 30 mL via INTRAVENOUS

## 2023-09-15 SURGICAL SUPPLY — 9 items
BALLOON MUSTANG 8.0X40 75 (BALLOONS) IMPLANT
KIT ENCORE 26 ADVANTAGE (KITS) IMPLANT
KIT MICROPUNCTURE NIT STIFF (SHEATH) IMPLANT
KIT PV (KITS) ×3 IMPLANT
SHEATH PINNACLE R/O II 6F 4CM (SHEATH) IMPLANT
SHEATH PROBE COVER 6X72 (BAG) IMPLANT
TRAY PV CATH (CUSTOM PROCEDURE TRAY) ×3 IMPLANT
TUBING CIL FLEX 10 FLL-RA (TUBING) IMPLANT
WIRE BENTSON .035X145CM (WIRE) IMPLANT

## 2023-09-15 NOTE — H&P (Signed)
 Hospital Consult    Reason for Consult: Left leg AV graft malfunction Requesting Physician: Nephrology MRN #:  [id number]  History of Present Illness: This is a 48 y.o. female who presents from dialysis due to left leg AV graft malfunction.  On exam, [name] was doing well.  She notes prolonged bleeding at dialysis.  No issues with flows.  Last dialysis session was yesterday.  She is not on anticoagulation.  The loop graft was placed in 2018 by my partner Dr. Serene.  Past Medical History:  Diagnosis Date   Anemia    BV (bacterial vaginosis) 11/08/2013   Depression    Dialysis patient [facility])    DVT (deep venous thrombosis) (HCC)    Hypertension    Lupus    Renal disorder    Vaginal odor 11/08/2013    Past Surgical History:  Procedure Laterality Date   A/V SHUNT INTERVENTION N/A 10/24/2016   Procedure: A/V SHUNT INTERVENTION;  Surgeon: Jama Cordella MATSU, MD;  Location: [facility] INVASIVE CV LAB;  Service: Cardiovascular;  Laterality: N/A;   A/V SHUNTOGRAM Left 12/08/2017   Procedure: A/V SHUNTOGRAM;  Surgeon: Jama Cordella MATSU, MD;  Location: [facility];  Service: Cardiovascular;  Laterality: Left;   A/V SHUNTOGRAM Left 01/28/2018   Procedure: A/V SHUNTOGRAM;  Surgeon: [name], MD;  Location: [facility];  Service: Cardiovascular;  Laterality: Left;   A/V SHUNTOGRAM Left 04/01/2018   Procedure: A/V SHUNTOGRAM;  Surgeon: [name], MD;  Location: [facility];  Service: Cardiovascular;  Laterality: Left;   A/V SHUNTOGRAM Left 04/28/2019   Procedure: A/V SHUNTOGRAM;  Surgeon: [name], MD;  Location: [facility];  Service: Cardiovascular;  Laterality: Left;   AV FISTULA PLACEMENT     AV FISTULA PLACEMENT Left 02/09/2022   Procedure: FISTULAGRAM, placement of left arm stent.;  Surgeon: [name], MD;  Location: [facility] OR;  Service: Vascular;  Laterality: Left;   AV FISTULA PLACEMENT Right 09/02/2022   Procedure: EXPLORATION OF RIGHT  AXILLA, INADEQUATE VEIN ACCESS;  Surgeon: Oris Krystal FALCON, MD;  Location: [facility];  Service: Vascular;  Laterality: Right;   AV FISTULA PLACEMENT Left 09/25/2022   Procedure: INSERTION OF LEFT FEMORAL LOOP GRAFT USING 4-42mm GORETEX STRETCH GRAFT;  Surgeon: [name], MD;  Location: [facility] OR;  Service: Vascular;  Laterality: Left;   DIALYSIS/PERMA CATHETER INSERTION  10/23/2016   Procedure: DIALYSIS/PERMA CATHETER INSERTION;  Surgeon: [name], MD;  Location: [facility];  Service: Cardiovascular;;   IR AV DIALY SHUNT INTRO NEEDLE/INTRACATH INITIAL W/PTA/IMG LEFT  05/12/2023   IR REMOVAL TUN CV CATH W/O FL  09/22/2017   IR REMOVAL TUN CV CATH W/O FL  01/06/2023   IR THROMBECTOMY AV FISTULA W/THROMBOLYSIS INC/SHUNT/IMG LEFT Left 09/04/2017   IR US  GUIDE VASC ACCESS LEFT  09/04/2017   IR US  GUIDE VASC ACCESS LEFT  05/12/2023   MASS EXCISION Left 06/01/2018   Procedure: EXCISION 5 X 3CM LIPOMA LEFT THIGH;  Surgeon: [name], MD;  Location: [facility];  Service: General;  Laterality: Left;   PERIPHERAL VASCULAR CATHETERIZATION Left 01/04/2015   Procedure: A/V Shuntogram/Fistulagram;  Surgeon: Selinda RAMAN Marea, MD;  Location: [facility];  Service: Cardiovascular;  Laterality: Left;   PERIPHERAL VASCULAR CATHETERIZATION N/A 01/04/2015   Procedure: A/V Shunt Intervention;  Surgeon: Selinda RAMAN Marea, MD;  Location: [facility];  Service: Cardiovascular;  Laterality: N/A;   PERIPHERAL VASCULAR CATHETERIZATION Left 04/13/2015  Procedure: A/V Shuntogram/Fistulagram;  Surgeon: [name], MD;  Location: [facility];  Service: Cardiovascular;  Laterality: Left;   PERIPHERAL VASCULAR CATHETERIZATION Left 04/13/2015   Procedure: A/V Shunt Intervention;  Surgeon: [name], MD;  Location: [facility];  Service: Cardiovascular;  Laterality: Left;   REMOVAL OF A DIALYSIS CATHETER Left 09/25/2022   Procedure: REMOVAL OF LEFT INTERNAL JUGULAR HERO CATHETER;  Surgeon:  [name], MD;  Location: [facility] OR;  Service: Vascular;  Laterality: Left;   UPPER EXTREMITY VENOGRAPHY N/A 08/26/2022   Procedure: UPPER EXTREMITY VENOGRAPHY;  Surgeon: [name], MD;  Location: [facility];  Service: Cardiovascular;  Laterality: N/A;    Allergies  Allergen Reactions   Dust Mite Extract Other (See Comments)    sneezing    Prior to Admission medications   Medication Sig Start Date End Date Taking? Authorizing Provider  albuterol  (PROVENTIL ) (2.5 MG/3ML) 0.083% nebulizer solution Take 2.5 mg by nebulization 4 (four) times daily as needed for wheezing or shortness of breath.     [provider]  ALPRAZolam  (XANAX ) 0.25 MG tablet Take 0.25 mg by mouth 2 (two) times daily.    [provider]  ammonium lactate  (AMLACTIN DAILY) 12 % lotion Apply 1 Application topically as needed for dry skin. 04/23/23   [name], DPM  aspirin EC 81 MG tablet Take 81 mg by mouth daily.    [provider]  Calcium Carbonate Antacid (TUMS PO) Take 1,000 mg by mouth 2 (two) times daily between meals. 06/13/22   [provider]  chlorhexidine  (PERIDEX ) 0.12 % solution Use as directed 15 mLs in the mouth or throat 2 (two) times daily. After regular toothbrushing, rinse mouth 08/20/22   [provider]  clopidogrel (PLAVIX) 75 MG tablet Take 75 mg by mouth daily.    [provider]  cycloSPORINE (RESTASIS) 0.05 % ophthalmic emulsion Place 1 drop into both eyes 2 (two) times daily. 01/31/21   [provider]  docusate sodium  (COLACE) 100 MG capsule Take 100 mg by mouth daily.    [provider]  epoetin  alfa (EPOGEN ,PROCRIT) 4000 UNIT/ML injection Inject 4,000 Units into the vein See admin instructions. Mon, Wed, and Friday at dialysis    [provider]  guaiFENesin  (ROBITUSSIN) 100 MG/5ML liquid Take 200 mg by mouth every 6 (six) hours as needed for cough.     [provider]  hydroxychloroquine   (PLAQUENIL ) 200 MG tablet Take 200 mg by mouth daily.    [provider]  ipratropium (ATROVENT ) 0.02 % nebulizer solution Take 0.5 mg by nebulization 4 (four) times daily as needed for wheezing or shortness of breath (For Breathing).    [provider]  lidocaine -prilocaine  (EMLA ) cream Apply 1 application topically as needed (port access).  12/20/12   [provider]  loratadine (CLARITIN) 10 MG tablet Take 10 mg by mouth daily as needed for allergies.    [provider]  midodrine  (PROAMATINE ) 10 MG tablet Take 10 mg by mouth 2 (two) times daily.     [provider]  mirtazapine  (REMERON ) 15 MG tablet Take 7.5 mg by mouth at bedtime.  11/03/13   [provider]  multivitamin (RENA-VIT) TABS tablet Take 1 tablet by mouth daily.    [provider]  oxyCODONE -acetaminophen  (PERCOCET) 5-325 MG tablet Take 1 tablet by mouth every 6 (six) hours as needed for severe pain. 09/25/22   Bethanie Cough, PA-C  pantoprazole  (PROTONIX ) 40  MG tablet Take 40 mg by mouth daily.    [provider]  phenytoin  (DILANTIN ) 100 MG ER capsule Take 100 mg by mouth 2 (two) times daily.     [provider]  sertraline  (ZOLOFT ) 100 MG tablet Take 200 mg by mouth daily.     [provider]  simvastatin  (ZOCOR ) 40 MG tablet Take 40 mg by mouth at bedtime.    [provider]    Social History   Socioeconomic History   Marital status: Single    Spouse name: Not on file   Number of children: Not on file   Years of education: Not on file   Highest education level: Not on file  Occupational History   Not on file  Tobacco Use   Smoking status: Every Day    Current packs/day: 0.10    Average packs/day: 0.1 packs/day for 15.0 years (1.5 ttl pk-yrs)    Types: Cigarettes    Passive exposure: Current   Smokeless tobacco: Never  Vaping Use   Vaping status: Never Used  Substance and Sexual Activity   Alcohol  use: No   Drug  use: No   Sexual activity: Not Currently    Birth control/protection: None, Abstinence  Other Topics Concern   Not on file  Social History Narrative   Lives at [facility]   Social Drivers of Health   Financial Resource Strain: Low Risk  (07/01/2022)   Overall Financial Resource Strain (CARDIA)    Difficulty of Paying Living Expenses: Not hard at all  Food Insecurity: No Food Insecurity (07/01/2022)   Hunger Vital Sign    Worried About Running Out of Food in the Last Year: Never true    Ran Out of Food in the Last Year: Never true  Transportation Needs: No Transportation Needs (07/01/2022)   PRAPARE - Administrator, Civil Service (Medical): No    Lack of Transportation (Non-Medical): No  Physical Activity: Inactive (07/01/2022)   Exercise Vital Sign    Days of Exercise per Week: 0 days    Minutes of Exercise per Session: 0 min  Stress: No Stress Concern Present (07/01/2022)   [employer] of Occupational Health - Occupational Stress Questionnaire    Feeling of Stress : Not at all  Social Connections: Socially Isolated (07/01/2022)   Social Connection and Isolation Panel    Frequency of Communication with Friends and Family: Once a week    Frequency of Social Gatherings with Friends and Family: Once a week    Attends Religious Services: 1 to 4 times per year    Active Member of [employer] or Organizations: No    Attends Banker Meetings: Never    Marital Status: Never married  Intimate Partner Violence: Not At Risk (07/01/2022)   Humiliation, Afraid, Rape, and Kick questionnaire    Fear of Current or Ex-Partner: No    Emotionally Abused: No    Physically Abused: No    Sexually Abused: No   Family History  Problem Relation Age of Onset   Seizures Son    Kidney disease Mother        on dialysis   Lupus Mother     ROS: Otherwise negative unless mentioned in HPI  Physical Examination  Vitals:   09/15/23 0954 09/15/23 1013  BP: 139/81  139/81  Pulse: 80 78  Resp: 12 14  Temp: 97.7 F (36.5 C)   SpO2: 98% 100%   There is no height or weight  on file to calculate BMI.  General:  WDWN in NAD Gait: Not observed HENT: WNL, normocephalic Pulmonary: normal non-labored breathing, without Rales, rhonchi,  wheezing Cardiac: regula Abdomen: soft, NT/ND, no masses Skin: without rashes Vascular Exam/Pulses: Lightly palpable pulse in the left thigh AV graft Extremities: without ischemic changes, without Gangrene , without cellulitis; without open wounds;  Musculoskeletal: no muscle wasting or atrophy  Neurologic: A&O X 3;  No focal weakness or paresthesias are detected; speech is fluent/normal Psychiatric:  The pt has Normal affect. Lymph:  Unremarkable  CBC    Component Value Date/Time   WBC 4.7 07/02/2022 1352   RBC 4.35 07/02/2022 1352   HGB 14.6 09/25/2022 0702   HGB 10.4 (L) 02/03/2013 1912   HCT 43.0 09/25/2022 0702   HCT 31.0 (L) 02/03/2013 1912   PLT 216 07/02/2022 1352   PLT 346 02/03/2013 1912   MCV 83.0 07/02/2022 1352   MCV 88 02/03/2013 1912   MCH 28.5 07/02/2022 1352   MCHC 34.3 07/02/2022 1352   RDW 16.1 (H) 07/02/2022 1352   RDW 14.4 02/03/2013 1912   LYMPHSABS 3.0 02/08/2022 1654   LYMPHSABS 1.8 02/03/2013 1912   MONOABS 1.1 (H) 02/08/2022 1654   MONOABS 0.5 02/03/2013 1912   EOSABS 0.2 02/08/2022 1654   EOSABS 0.4 02/03/2013 1912   BASOSABS 0.1 02/08/2022 1654   BASOSABS 0.1 02/03/2013 1912    BMET    Component Value Date/Time   NA 139 05/12/2023 1249   NA 137 02/18/2013 0440   K 4.4 05/12/2023 1249   K 4.9 07/06/2014 1308   CL 100 05/12/2023 1249   CL 100 02/18/2013 0440   CO2 31 05/12/2023 1249   CO2 33 (H) 02/18/2013 0440   GLUCOSE 86 05/12/2023 1249   GLUCOSE 73 02/18/2013 0440   BUN 19 05/12/2023 1249   BUN 9 02/18/2013 0440   CREATININE 6.62 (H) 05/12/2023 1249   CREATININE 1.93 (H) 02/18/2013 0440   CALCIUM 9.8 05/12/2023 1249   CALCIUM 8.2 (L) 02/18/2013 0440   CALCIUM  (LL) 04/08/2007 1300    6.1 Result repeated and verified. CRITICAL VALUE NOTED.  VALUE IS CONSISTENT WITH PREVIOUSLY REPORTED AND CALLED VALUE. CRITICAL RESULT CALLED TO, READ BACK BY AND VERIFIED WITH: MENIGOSTO,A RN [id number] S[place]ZIL[place] S.   GFRNONAA 7 (L) 05/12/2023 1249   GFRNONAA 32 (L) 02/18/2013 0440   GFRAA 8 (L) 06/01/2018 0822   GFRAA 37 (L) 02/18/2013 0440    COAGS: Lab Results  Component Value Date   INR 1.1 07/02/2022   INR 1.14 09/04/2017   INR 1.1 10/06/2008      ASSESSMENT/PLAN: This is a 48 y.o. female with malfunction of left thigh AV graft.  She has prolonged bleeding after dialysis.  After discussing the risks and benefits of left thigh fistulogram in an effort to define, and improve flow through the graft so that she can continue dialysis, and that elected to proceed.   Fonda FORBES Rim MD MS [facility] [phone] 09/15/2023  10:47 AM

## 2023-09-15 NOTE — Op Note (Signed)
    Patient name: [name] MRN: [id number] DOB: 1975/05/05 Sex: female  09/15/2023 Pre-operative Diagnosis: Left AV thigh graft malfunction-prolonged bleeding Post-operative diagnosis:  Same Surgeon:  Fonda FORBES Rim, MD Procedure Performed: 1.  Left AV thigh graft fistulogram 2.  Balloon venoplasty left common femoral vein 8 x 40 mm 3.  Contrast volume 30 mL 4.  Access manage with Monocryl suture   Indications: Patient is a 48 year old female with end-stage renal disease currently being dialyzed using the left thigh AV graft placed in 2018.  She has had prolonged bleeding after dialysis.  After discussing risks and benefits of left thigh fistulogram in an effort to define and improve flows for continued dialysis, the patient elected to proceed  Findings:   No flow-limiting stenosis at the arterial anastomosis No flow-limiting stenosis in the AV graft Greater than 60% stenosis at the left common femoral vein Widely patent left external iliac artery   Procedure:  The patient was identified in the holding area and taken to room 8.  The patient was then placed supine on the table and prepped and draped in the usual sterile fashion.  A time out was called.   The left thigh AV graft was accessed using a micropuncture needle.  This was exchanged for a micropuncture sheath.  Fistulogram followed.  I elected to attempt intervention on the left common femoral vein.  The patient was heparinized, and the micropuncture sheath was exchanged for a 6 French sheath.  Next, an 8 x 40 mm balloon was brought into the field and laid across the common femoral vein.  This was inflated initially to nominal atmospheres, and then inflated to burst pressure of 20 atm.  This was inflated for 2 minutes with follow-up angiography demonstrating significant improvement.  I elected to reinflate the balloon to burst pressure again for another 2 minutes.  Follow-up angiography demonstrated excellent result with  resolution of luminal stenosis.  Residual stenosis roughly 10%.  There was an excellent, palpable thrill within the left thigh graft.  Access was managed with Monocryl suture.  The graft remains amenable to future intervention. The AV graft can continue to be utilized for dialysis.    Fonda FORBES Rim MD [facility] [place] Office: [phone]

## 2023-09-16 ENCOUNTER — Encounter (HOSPITAL_COMMUNITY): Payer: Self-pay | Admitting: Vascular Surgery

## 2023-11-12 ENCOUNTER — Other Ambulatory Visit (HOSPITAL_COMMUNITY): Payer: Self-pay | Admitting: Internal Medicine

## 2023-11-12 DIAGNOSIS — Z1231 Encounter for screening mammogram for malignant neoplasm of breast: Secondary | ICD-10-CM

## 2023-12-03 ENCOUNTER — Ambulatory Visit (HOSPITAL_COMMUNITY)
Admission: RE | Admit: 2023-12-03 | Discharge: 2023-12-03 | Disposition: A | Discharge status: Home / Self Care | Source: Ambulatory Visit | Attending: Internal Medicine | Admitting: Internal Medicine

## 2023-12-03 ENCOUNTER — Encounter (HOSPITAL_COMMUNITY): Payer: Self-pay

## 2023-12-03 DIAGNOSIS — Z1231 Encounter for screening mammogram for malignant neoplasm of breast: Secondary | ICD-10-CM | POA: Diagnosis present

## 2023-12-07 ENCOUNTER — Other Ambulatory Visit: Payer: Self-pay

## 2023-12-07 ENCOUNTER — Encounter (HOSPITAL_COMMUNITY): Payer: Self-pay | Admitting: Emergency Medicine

## 2023-12-07 ENCOUNTER — Inpatient Hospital Stay (HOSPITAL_COMMUNITY)
Admission: EM | Admit: 2023-12-07 | Discharge: 2023-12-10 | DRG: 377 | Disposition: A | Discharge status: Home / Self Care | Attending: Family Medicine | Admitting: Family Medicine

## 2023-12-07 DIAGNOSIS — Z604 Social exclusion and rejection: Secondary | ICD-10-CM | POA: Diagnosis present

## 2023-12-07 DIAGNOSIS — I120 Hypertensive chronic kidney disease with stage 5 chronic kidney disease or end stage renal disease: Secondary | ICD-10-CM | POA: Diagnosis present

## 2023-12-07 DIAGNOSIS — K2971 Gastritis, unspecified, with bleeding: Secondary | ICD-10-CM | POA: Diagnosis not present

## 2023-12-07 DIAGNOSIS — N186 End stage renal disease: Secondary | ICD-10-CM

## 2023-12-07 DIAGNOSIS — Z86718 Personal history of other venous thrombosis and embolism: Secondary | ICD-10-CM

## 2023-12-07 DIAGNOSIS — M3210 Systemic lupus erythematosus, organ or system involvement unspecified: Secondary | ICD-10-CM | POA: Diagnosis present

## 2023-12-07 DIAGNOSIS — Z7902 Long term (current) use of antithrombotics/antiplatelets: Secondary | ICD-10-CM

## 2023-12-07 DIAGNOSIS — M329 Systemic lupus erythematosus, unspecified: Secondary | ICD-10-CM | POA: Diagnosis present

## 2023-12-07 DIAGNOSIS — F32A Depression, unspecified: Secondary | ICD-10-CM | POA: Diagnosis present

## 2023-12-07 DIAGNOSIS — Z723 Lack of physical exercise: Secondary | ICD-10-CM

## 2023-12-07 DIAGNOSIS — D62 Acute posthemorrhagic anemia: Secondary | ICD-10-CM | POA: Diagnosis present

## 2023-12-07 DIAGNOSIS — R051 Acute cough: Secondary | ICD-10-CM | POA: Diagnosis not present

## 2023-12-07 DIAGNOSIS — R059 Cough, unspecified: Secondary | ICD-10-CM | POA: Diagnosis present

## 2023-12-07 DIAGNOSIS — K921 Melena: Secondary | ICD-10-CM | POA: Diagnosis not present

## 2023-12-07 DIAGNOSIS — D649 Anemia, unspecified: Secondary | ICD-10-CM | POA: Diagnosis not present

## 2023-12-07 DIAGNOSIS — I959 Hypotension, unspecified: Secondary | ICD-10-CM | POA: Diagnosis present

## 2023-12-07 DIAGNOSIS — F17210 Nicotine dependence, cigarettes, uncomplicated: Secondary | ICD-10-CM | POA: Diagnosis present

## 2023-12-07 DIAGNOSIS — K922 Gastrointestinal hemorrhage, unspecified: Principal | ICD-10-CM | POA: Diagnosis present

## 2023-12-07 DIAGNOSIS — F71 Moderate intellectual disabilities: Secondary | ICD-10-CM | POA: Diagnosis present

## 2023-12-07 DIAGNOSIS — Z992 Dependence on renal dialysis: Secondary | ICD-10-CM

## 2023-12-07 DIAGNOSIS — I10 Essential (primary) hypertension: Secondary | ICD-10-CM | POA: Diagnosis present

## 2023-12-07 DIAGNOSIS — E119 Type 2 diabetes mellitus without complications: Secondary | ICD-10-CM

## 2023-12-07 DIAGNOSIS — J45909 Unspecified asthma, uncomplicated: Secondary | ICD-10-CM | POA: Diagnosis present

## 2023-12-07 DIAGNOSIS — G40909 Epilepsy, unspecified, not intractable, without status epilepticus: Secondary | ICD-10-CM | POA: Diagnosis present

## 2023-12-07 DIAGNOSIS — E876 Hypokalemia: Secondary | ICD-10-CM | POA: Diagnosis present

## 2023-12-07 DIAGNOSIS — Z841 Family history of disorders of kidney and ureter: Secondary | ICD-10-CM

## 2023-12-07 DIAGNOSIS — Z79899 Other long term (current) drug therapy: Secondary | ICD-10-CM

## 2023-12-07 DIAGNOSIS — Z8269 Family history of other diseases of the musculoskeletal system and connective tissue: Secondary | ICD-10-CM

## 2023-12-07 DIAGNOSIS — R7401 Elevation of levels of liver transaminase levels: Secondary | ICD-10-CM | POA: Diagnosis present

## 2023-12-07 DIAGNOSIS — E1122 Type 2 diabetes mellitus with diabetic chronic kidney disease: Secondary | ICD-10-CM | POA: Diagnosis present

## 2023-12-07 DIAGNOSIS — U071 COVID-19: Secondary | ICD-10-CM | POA: Diagnosis present

## 2023-12-07 DIAGNOSIS — E785 Hyperlipidemia, unspecified: Secondary | ICD-10-CM | POA: Diagnosis present

## 2023-12-07 DIAGNOSIS — Z7982 Long term (current) use of aspirin: Secondary | ICD-10-CM

## 2023-12-07 LAB — CBC
HCT: 20.5 % — ABNORMAL LOW (ref 36.0–46.0)
Hemoglobin: 7 g/dL — ABNORMAL LOW (ref 12.0–15.0)
MCH: 27.5 pg (ref 26.0–34.0)
MCHC: 34.1 g/dL (ref 30.0–36.0)
MCV: 80.4 fL (ref 80.0–100.0)
Platelets: 189 K/uL (ref 150–400)
RBC: 2.55 MIL/uL — ABNORMAL LOW (ref 3.87–5.11)
RDW: 17.4 % — ABNORMAL HIGH (ref 11.5–15.5)
WBC: 4.7 K/uL (ref 4.0–10.5)
nRBC: 0 % (ref 0.0–0.2)

## 2023-12-07 LAB — POC OCCULT BLOOD, ED

## 2023-12-07 LAB — COMPREHENSIVE METABOLIC PANEL WITH GFR
ALT: 56 U/L — ABNORMAL HIGH (ref 0–44)
AST: 97 U/L — ABNORMAL HIGH (ref 15–41)
Albumin: 3.5 g/dL (ref 3.5–5.0)
Alkaline Phosphatase: 58 U/L (ref 38–126)
Anion gap: 15 (ref 5–15)
BUN: 17 mg/dL (ref 6–20)
CO2: 27 mmol/L (ref 22–32)
Calcium: 8.7 mg/dL — ABNORMAL LOW (ref 8.9–10.3)
Chloride: 96 mmol/L — ABNORMAL LOW (ref 98–111)
Creatinine, Ser: 4 mg/dL — ABNORMAL HIGH (ref 0.44–1.00)
GFR, Estimated: 13 mL/min — ABNORMAL LOW (ref >60)
Glucose, Bld: 96 mg/dL (ref 70–99)
Potassium: 2.7 mmol/L — CL (ref 3.5–5.1)
Sodium: 138 mmol/L (ref 135–145)
Total Bilirubin: 0.5 mg/dL (ref 0.0–1.2)
Total Protein: 6.7 g/dL (ref 6.5–8.1)

## 2023-12-07 LAB — PREPARE RBC (CROSSMATCH)

## 2023-12-07 LAB — MAGNESIUM: Magnesium: 1.8 mg/dL (ref 1.7–2.4)

## 2023-12-07 MED ORDER — POTASSIUM CHLORIDE CRYS ER 20 MEQ PO TBCR
20.0000 meq | EXTENDED_RELEASE_TABLET | Freq: Once | ORAL | Status: AC
Start: 2023-12-07 — End: 2023-12-07
  Administered 2023-12-07: 20 meq via ORAL
  Filled 2023-12-07: qty 1

## 2023-12-07 MED ORDER — PANTOPRAZOLE SODIUM 40 MG IV SOLR
40.0000 mg | Freq: Two times a day (BID) | INTRAVENOUS | Status: DC
  Administered 2023-12-07 – 2023-12-10 (×6): 40 mg via INTRAVENOUS
  Filled 2023-12-07 (×6): qty 10

## 2023-12-07 MED ORDER — SODIUM CHLORIDE 0.9% IV SOLUTION: Freq: Once | INTRAVENOUS | Status: AC

## 2023-12-07 NOTE — ED Provider Notes (Signed)
 [place] EMERGENCY DEPARTMENT AT [facility] Provider Note   CSN: [id number] Arrival date & time: 12/07/23  1641     Patient presents with: Rectal Bleeding and Dark Colored Stools   Helon Wisinski is a 48 y.o. female.  {Add pertinent medical, surgical, social history, OB history to YEP:67052}  Rectal Bleeding This patient is a 48 year old female, she is on dialysis Monday Wednesday and Friday and states that she has been on dialysis for too long but refuses to give me any other information.  She is on Plavix  as well as a baby aspirin.  She presents with several days of feeling progressively weak with some dark-colored stools.  She states they have been kind of liquid like, she has access in her leg, based on the medical record the patient has been admitted to the hospital many times over the years for different types of bleeding problems, anemia, AV graft bleeding, she was at dialysis today and after finishing dialysis she was transported here for evaluation of her dark stools.  She denies vomiting abdominal pain chest pain coughing or shortness of breath, she does feel generally weak and fatigued     Prior to Admission medications   Medication Sig Start Date End Date Taking? Authorizing Provider  albuterol  (PROVENTIL ) (2.5 MG/3ML) 0.083% nebulizer solution Take 2.5 mg by nebulization 4 (four) times daily as needed for wheezing or shortness of breath.     [provider]  ALPRAZolam  (XANAX ) 0.25 MG tablet Take 0.25 mg by mouth 2 (two) times daily.    [provider]  ammonium lactate  (AMLACTIN DAILY) 12 % lotion Apply 1 Application topically as needed for dry skin. 04/23/23   [name], DPM  aspirin EC 81 MG tablet Take 81 mg by mouth daily.    [provider]  Calcium Carbonate Antacid (TUMS PO) Take 1,000 mg by mouth 2 (two) times daily between meals. 06/13/22   [provider]  chlorhexidine  (PERIDEX ) 0.12 % solution Use as directed 15  mLs in the mouth or throat 2 (two) times daily. After regular toothbrushing, rinse mouth 08/20/22   [provider]  clopidogrel  (PLAVIX ) 75 MG tablet Take 75 mg by mouth daily.    [provider]  cycloSPORINE (RESTASIS) 0.05 % ophthalmic emulsion Place 1 drop into both eyes 2 (two) times daily. 01/31/21   [provider]  docusate sodium  (COLACE) 100 MG capsule Take 100 mg by mouth daily.    [provider]  epoetin  alfa (EPOGEN ,PROCRIT) 4000 UNIT/ML injection Inject 4,000 Units into the vein See admin instructions. Mon, Wed, and Friday at dialysis    [provider]  guaiFENesin  (ROBITUSSIN) 100 MG/5ML liquid Take 200 mg by mouth every 6 (six) hours as needed for cough.     [provider]  hydroxychloroquine  (PLAQUENIL ) 200 MG tablet Take 200 mg by mouth daily.    [provider]  ipratropium (ATROVENT ) 0.02 % nebulizer solution Take 0.5 mg by nebulization 4 (four) times daily as needed for wheezing or shortness of breath (For Breathing).    [provider]  lidocaine -prilocaine  (EMLA ) cream Apply 1 application topically as needed (port access).  12/20/12   [provider]  loratadine (CLARITIN) 10 MG tablet Take 10 mg by mouth daily as needed for allergies.    [provider]  midodrine  (PROAMATINE ) 10 MG tablet Take 10 mg by mouth 2 (two) times daily.     [provider]  mirtazapine  (REMERON ) 15 MG tablet  Take 7.5 mg by mouth at bedtime.  11/03/13   [provider]  multivitamin (RENA-VIT) TABS tablet Take 1 tablet by mouth daily.    [provider]  oxyCODONE -acetaminophen  (PERCOCET) 5-325 MG tablet Take 1 tablet by mouth every 6 (six) hours as needed for severe pain. 09/25/22   Bethanie Cough, PA-C  pantoprazole  (PROTONIX ) 40 MG tablet Take 40 mg by mouth daily.    [provider]  phenytoin  (DILANTIN ) 100 MG ER capsule Take 100 mg by mouth 2 (two) times daily.      [provider]  sertraline  (ZOLOFT ) 100 MG tablet Take 200 mg by mouth daily.     [provider]  simvastatin  (ZOCOR ) 40 MG tablet Take 40 mg by mouth at bedtime.    [provider]    Allergies: Dust mite extract    Review of Systems  Gastrointestinal:  Positive for hematochezia.  All other systems reviewed and are negative.   Updated Vital Signs BP (!) 142/64 (BP Location: Right Arm)   Pulse 97   Temp 99.8 F (37.7 C) (Oral)   Resp 18   Ht 1.6 m (5' 3)   Wt 58 kg   LMP 10/28/2023 (Approximate)   SpO2 100%   BMI 22.65 kg/m   Physical Exam Vitals and nursing note reviewed.  Constitutional:      General: She is not in acute distress.    Appearance: She is well-developed.  HENT:     Head: Normocephalic and atraumatic.     Mouth/Throat:     Mouth: Mucous membranes are moist.     Pharynx: No oropharyngeal exudate.  Eyes:     General: No scleral icterus.       Right eye: No discharge.        Left eye: No discharge.     Pupils: Pupils are equal, round, and reactive to light.     Comments: Pale conjunctive a  Neck:     Thyroid : No thyromegaly.     Vascular: No JVD.  Cardiovascular:     Rate and Rhythm: Normal rate and regular rhythm.     Heart sounds: Murmur heard.     No friction rub. No gallop.  Pulmonary:     Effort: Pulmonary effort is normal. No respiratory distress.     Breath sounds: Normal breath sounds. No wheezing or rales.  Abdominal:     General: Bowel sounds are normal. There is no distension.     Palpations: Abdomen is soft. There is no mass.     Tenderness: There is no abdominal tenderness.  Musculoskeletal:        General: No tenderness. Normal range of motion.     Cervical back: Normal range of motion and neck supple.     Right lower leg: No edema.     Left lower leg: No edema.  Lymphadenopathy:     Cervical: No cervical adenopathy.  Skin:    General: Skin is warm and dry.     Findings: No erythema or rash.   Neurological:     Mental Status: She is alert.     Coordination: Coordination normal.  Psychiatric:        Behavior: Behavior normal.     (all labs ordered are listed, but only abnormal results are displayed) Labs Reviewed  COMPREHENSIVE METABOLIC PANEL WITH GFR  CBC  POC OCCULT BLOOD, ED  POC URINE PREG, ED  TYPE AND SCREEN    EKG: None  Radiology: No results found.  {  Document cardiac monitor, telemetry assessment procedure when appropriate:32947} Procedures   Medications Ordered in the ED - No data to display    {Click here for ABCD2, HEART and other calculators REFRESH Note before signing:1}                              Medical Decision Making Amount and/or Complexity of Data Reviewed Labs: ordered.    This patient presents to the ED for concern of dark-colored stools, this involves an extensive number of treatment options, and is a complaint that carries with it a high risk of complications and morbidity.  The differential diagnosis includes diarrhea, infection, gastrointestinal hemorrhage   Co morbidities / Chronic conditions that complicate the patient evaluation  End-stage renal disease on dialysis   Additional history obtained:  Additional history obtained from EMR External records from outside source obtained and reviewed including medical record including vascular note, nephrology notes and multiple prior episodes of dialysis   Lab Tests:  I Ordered, and personally interpreted labs.  The pertinent results include: CBC with a hemoglobin of 7, most recently it was 14.6 but that was just over 1 year ago, she had been as low as 7.9 the year prior to that.  Hemoccult positive   Cardiac Monitoring: / EKG:  The patient was maintained on a cardiac monitor.  I personally viewed and interpreted the cardiac monitored which showed an underlying rhythm of: Sinus rhythm, borderline tachycardia   Problem List / ED Course / Critical interventions / Medication  management  Started Protonix  given the likelihood of upper GI bleed I ordered medication including pantoprazole  IV Reevaluation of the patient after these medicines showed that the patient the patient remains hemodynamically stable I have reviewed the patients home medicines and have made adjustments as needed   Consultations Obtained:  I requested consultation with the gastroenterology and hospitalist,  and discussed lab and imaging findings as well as pertinent plan - they recommend: Admission, likely needs endoscopy   Social Determinants of Health:  End-stage renal disease on dialysis   Test / Admission - Considered:  Admit to the hospital   {Document critical care time when appropriate  Document review of labs and clinical decision tools ie CHADS2VASC2, etc  Document your independent review of radiology images and any outside records  Document your discussion with family members, caretakers and with consultants  Document social determinants of health affecting pt's care  Document your decision making why or why not admission, treatments were needed:32947:::1}   Final diagnoses:  None    ED Discharge Orders     None

## 2023-12-07 NOTE — ED Notes (Signed)
 Date and time results received: 12/07/23 1837   Test: K+ Critical Value: 2.7  Name of Provider Notified: Cleotilde, MD

## 2023-12-07 NOTE — H&P (Addendum)
 History and Physical    Quanda Pavlicek FMW:[id number] DOB: [birth year] DOA: 12/07/2023  PCP: Gammon, Chrystal, NP   Patient coming from: Home  I have personally briefly reviewed patient's old medical records in [facility]  Chief Complaint: Black stools   HPI: Alfhild Partch is a 48 y.o. female with medical history significant for ESRD on dialysis, diabetes mellitus, DVT, hypertension, seizure, SLE, moderate intellectual disabilities. Patient presented to the ED with complaints of black stools-this started 2 days ago.  She denies vomiting, no actual blood in stools, no abdominal pain.  Aspirin and Plavix  are listed on her home medication list but she is unable to confirm to me if she is actually taking them.  She denies NSAID use.  She denies difficulty breathing with exertion or at rest, no chest pain.  She reports similar history of GI bleed in the past.  She reports history of GI bleed in the past.  She reports a mild cough over the past few days. Which she attributes to weather change.  ED Course: Tmax 99.8.  Respiratory rate 15-22.  Blood pressure systolic 135-142.  O2 sats greater than 99% on room air. Hemoglobin 7 down from baseline 12-14 last checked a year ago.  Mild elevation in liver enzymes, AST 97, ALT 56. Low potassium at  2.7. 1 U PRBC ordered for transfusion. EDP talked to GI Dr. Cindie, liquid diet, n.p.o. from midnight, Protonix , will see in consult in the morning.  Review of Systems: As per HPI all other systems reviewed and negative.  Past Medical History:  Diagnosis Date   Anemia    BV (bacterial vaginosis) 11/08/2013   Depression    Dialysis patient    DVT (deep venous thrombosis) (HCC)    Hypertension    Lupus    Renal disorder    Vaginal odor 11/08/2013    Past Surgical History:  Procedure Laterality Date   A/V SHUNT INTERVENTION N/A 10/24/2016   Procedure: A/V SHUNT INTERVENTION;  Surgeon: Jama Cordella MATSU, MD;  Location: [facility];   Service: Cardiovascular;  Laterality: N/A;   A/V SHUNT INTERVENTION Left 09/15/2023   Procedure: A/V SHUNT INTERVENTION;  Surgeon: Lanis Fonda BRAVO, MD;  Location: [facility];  Service: Cardiovascular;  Laterality: Left;   A/V SHUNTOGRAM Left 12/08/2017   Procedure: A/V SHUNTOGRAM;  Surgeon: Jama Cordella MATSU, MD;  Location: [facility];  Service: Cardiovascular;  Laterality: Left;   A/V SHUNTOGRAM Left 01/28/2018   Procedure: A/V SHUNTOGRAM;  Surgeon: [name], MD;  Location: [facility];  Service: Cardiovascular;  Laterality: Left;   A/V SHUNTOGRAM Left 04/01/2018   Procedure: A/V SHUNTOGRAM;  Surgeon: [name], MD;  Location: [facility];  Service: Cardiovascular;  Laterality: Left;   A/V SHUNTOGRAM Left 04/28/2019   Procedure: A/V SHUNTOGRAM;  Surgeon: [name], MD;  Location: [facility];  Service: Cardiovascular;  Laterality: Left;   AV FISTULA PLACEMENT     AV FISTULA PLACEMENT Left 02/09/2022   Procedure: FISTULAGRAM, placement of left arm stent.;  Surgeon: [name], MD;  Location: [facility] OR;  Service: Vascular;  Laterality: Left;   AV FISTULA PLACEMENT Right 09/02/2022   Procedure: EXPLORATION OF RIGHT AXILLA, INADEQUATE VEIN ACCESS;  Surgeon: Oris Krystal FALCON, MD;  Location: [facility];  Service: Vascular;  Laterality: Right;   AV FISTULA PLACEMENT Left 09/25/2022   Procedure: INSERTION OF LEFT FEMORAL LOOP GRAFT USING 4-89mm GORETEX STRETCH GRAFT;  Surgeon: Serene,  Gaile ORN, MD;  Location: [facility] OR;  Service: Vascular;  Laterality: Left;   DIALYSIS/PERMA CATHETER INSERTION  10/23/2016   Procedure: DIALYSIS/PERMA CATHETER INSERTION;  Surgeon: [name], MD;  Location: [facility];  Service: Cardiovascular;;   IR AV DIALY SHUNT INTRO NEEDLE/INTRACATH INITIAL W/PTA/IMG LEFT  05/12/2023   IR REMOVAL TUN CV CATH W/O FL  09/22/2017   IR REMOVAL TUN CV CATH W/O FL  01/06/2023   IR THROMBECTOMY AV FISTULA W/THROMBOLYSIS INC/SHUNT/IMG LEFT Left  09/04/2017   IR US  GUIDE VASC ACCESS LEFT  09/04/2017   IR US  GUIDE VASC ACCESS LEFT  05/12/2023   MASS EXCISION Left 06/01/2018   Procedure: EXCISION 5 X 3CM LIPOMA LEFT THIGH;  Surgeon: [name], MD;  Location: [facility];  Service: General;  Laterality: Left;   PERIPHERAL VASCULAR CATHETERIZATION Left 01/04/2015   Procedure: A/V Shuntogram/Fistulagram;  Surgeon: Selinda RAMAN Marea, MD;  Location: [facility];  Service: Cardiovascular;  Laterality: Left;   PERIPHERAL VASCULAR CATHETERIZATION N/A 01/04/2015   Procedure: A/V Shunt Intervention;  Surgeon: Selinda RAMAN Marea, MD;  Location: [facility];  Service: Cardiovascular;  Laterality: N/A;   PERIPHERAL VASCULAR CATHETERIZATION Left 04/13/2015   Procedure: A/V Shuntogram/Fistulagram;  Surgeon: [name], MD;  Location: [facility];  Service: Cardiovascular;  Laterality: Left;   PERIPHERAL VASCULAR CATHETERIZATION Left 04/13/2015   Procedure: A/V Shunt Intervention;  Surgeon: [name], MD;  Location: [facility];  Service: Cardiovascular;  Laterality: Left;   REMOVAL OF A DIALYSIS CATHETER Left 09/25/2022   Procedure: REMOVAL OF LEFT INTERNAL JUGULAR HERO CATHETER;  Surgeon: [name], MD;  Location: [facility];  Service: Vascular;  Laterality: Left;   UPPER EXTREMITY VENOGRAPHY N/A 08/26/2022   Procedure: UPPER EXTREMITY VENOGRAPHY;  Surgeon: [name], MD;  Location: [facility];  Service: Cardiovascular;  Laterality: N/A;   VENOUS ANGIOPLASTY  09/15/2023   Procedure: VENOUS ANGIOPLASTY;  Surgeon: Lanis Fonda BRAVO, MD;  Location: [facility];  Service: Cardiovascular;;  leg loop graft     reports that she has been smoking cigarettes. She has a 1.5 pack-year smoking history. She has been exposed to tobacco smoke. She has never used smokeless tobacco. She reports that she does not drink alcohol  and does not use drugs.  Allergies  Allergen Reactions   Dust Mite Extract Other (See Comments)     sneezing    Family History  Problem Relation Age of Onset   Seizures Son    Kidney disease Mother        on dialysis   Lupus Mother     Prior to Admission medications   Medication Sig Start Date End Date Taking? Authorizing Provider  albuterol  (PROVENTIL ) (2.5 MG/3ML) 0.083% nebulizer solution Take 2.5 mg by nebulization 4 (four) times daily as needed for wheezing or shortness of breath.     [provider]  ALPRAZolam  (XANAX ) 0.25 MG tablet Take 0.25 mg by mouth 2 (two) times daily.    [provider]  ammonium lactate  (AMLACTIN DAILY) 12 % lotion Apply 1 Application topically as needed for dry skin. 04/23/23   [name], DPM  aspirin EC 81 MG tablet Take 81 mg by mouth daily.    [provider]  Calcium Carbonate Antacid (TUMS PO) Take 1,000 mg by mouth 2 (two) times daily between meals. 06/13/22   [provider]  chlorhexidine  (PERIDEX ) 0.12 % solution Use as directed 15 mLs  in the mouth or throat 2 (two) times daily. After regular toothbrushing, rinse mouth 08/20/22   [provider]  clopidogrel  (PLAVIX ) 75 MG tablet Take 75 mg by mouth daily.    [provider]  cycloSPORINE (RESTASIS) 0.05 % ophthalmic emulsion Place 1 drop into both eyes 2 (two) times daily. 01/31/21   [provider]  docusate sodium  (COLACE) 100 MG capsule Take 100 mg by mouth daily.    [provider]  epoetin  alfa (EPOGEN ,PROCRIT) 4000 UNIT/ML injection Inject 4,000 Units into the vein See admin instructions. Mon, Wed, and Friday at dialysis    [provider]  guaiFENesin  (ROBITUSSIN) 100 MG/5ML liquid Take 200 mg by mouth every 6 (six) hours as needed for cough.     [provider]  hydroxychloroquine  (PLAQUENIL ) 200 MG tablet Take 200 mg by mouth daily.    [provider]  ipratropium (ATROVENT ) 0.02 % nebulizer solution Take 0.5 mg by nebulization 4 (four) times daily as needed for wheezing or shortness of  breath (For Breathing).    [provider]  lidocaine -prilocaine  (EMLA ) cream Apply 1 application topically as needed (port access).  12/20/12   [provider]  loratadine (CLARITIN) 10 MG tablet Take 10 mg by mouth daily as needed for allergies.    [provider]  midodrine  (PROAMATINE ) 10 MG tablet Take 10 mg by mouth 2 (two) times daily.     [provider]  mirtazapine  (REMERON ) 15 MG tablet Take 7.5 mg by mouth at bedtime.  11/03/13   [provider]  multivitamin (RENA-VIT) TABS tablet Take 1 tablet by mouth daily.    [provider]  oxyCODONE -acetaminophen  (PERCOCET) 5-325 MG tablet Take 1 tablet by mouth every 6 (six) hours as needed for severe pain. 09/25/22   Bethanie Cough, PA-C  pantoprazole  (PROTONIX ) 40 MG tablet Take 40 mg by mouth daily.    [provider]  phenytoin  (DILANTIN ) 100 MG ER capsule Take 100 mg by mouth 2 (two) times daily.     [provider]  sertraline  (ZOLOFT ) 100 MG tablet Take 200 mg by mouth daily.     [provider]  simvastatin  (ZOCOR ) 40 MG tablet Take 40 mg by mouth at bedtime.    [provider]    Physical Exam: Vitals:   12/07/23 1701 12/07/23 1900 12/07/23 1918 12/07/23 1935  BP: (!) 142/64 135/63 135/63 138/67  Pulse: 97 94 96 96  Resp: 18 15 (!) 22 18  Temp:   99.1 F (37.3 C) 99.2 F (37.3 C)  TempSrc:   Oral Oral  SpO2: 100% 99% 100% 100%  Weight:      Height:        Constitutional: NAD, calm, comfortable Vitals:   12/07/23 1701 12/07/23 1900 12/07/23 1918 12/07/23 1935  BP: (!) 142/64 135/63 135/63 138/67  Pulse: 97 94 96 96  Resp: 18 15 (!) 22 18  Temp:   99.1 F (37.3 C) 99.2 F (37.3 C)  TempSrc:   Oral Oral  SpO2: 100% 99% 100% 100%  Weight:      Height:       Eyes: PERRL, lids and conjunctivae normal ENMT: Mucous membranes are moist.  Neck: normal, supple, no masses, no thyromegaly Respiratory: clear to auscultation  bilaterally, no wheezing, no crackles. Normal respiratory effort. No accessory muscle use.  Cardiovascular: Regular rate and rhythm, no murmurs / rubs / gallops. No extremity edema.  Extremities warm. Abdomen: no tenderness, no masses palpated. No hepatosplenomegaly.  Bowel sounds positive.  Musculoskeletal: no clubbing / cyanosis. No joint deformity upper and lower extremities.  HD access left thigh. Skin: no rashes, lesions, ulcers. No induration Neurologic: No facial asymmetry, moves extremities spontaneously, speech fluent.SABRA  Psychiatric: Normal judgment and insight. Alert and oriented x 3. Normal mood.   Labs on Admission: I have personally reviewed following labs and imaging studies  CBC: Recent Labs  Lab 12/07/23 1722  WBC 4.7  HGB 7.0*  HCT 20.5*  MCV 80.4  PLT 189   Basic Metabolic Panel: Recent Labs  Lab 12/07/23 1722  NA 138  K 2.7*  CL 96*  CO2 27  GLUCOSE 96  BUN 17  CREATININE 4.00*  CALCIUM 8.7*   GFR: Estimated Creatinine Clearance: 14.2 mL/min (A) (by C-G formula based on SCr of 4 mg/dL (H)). Liver Function Tests: Recent Labs  Lab 12/07/23 1722  AST 97*  ALT 56*  ALKPHOS 58  BILITOT 0.5  PROT 6.7  ALBUMIN  3.5    Radiological Exams on Admission: No results found.  EKG:  None  Assessment/Plan Principal Problem:   Acute GI bleeding Active Problems:   Acute anemia   Cough   ESRD on hemodialysis (HCC)   MENTAL RETARDATION, MODERATE   Essential (primary) hypertension   Asthma   Seizure disorder (HCC)   Systemic lupus erythematosus, organ or system involvement unspecified (HCC)   Diabetes (HCC)  Assessment and Plan: No notes have been filed under this hospital service. Service: Hospitalist   Acute GI bleed/acute anemia-hemoglobin down to 7, baseline 12-14 last checked a year ago.  She reports melanotic stools of 2 days duration.  No hematemesis or hematochezia.  No abdominal pain.  Denies NSAID use.  Aspirin and Plavix  listed on home med  list but patient unable to confirm. - EDP talked with Dr. Cindie, clear liquid diet, n.p.o. midnight, IV Protonix  - 1 unit PRBC transfused - Trend H&H - Hold aspirin, Plavix   Hypokalemia-potassium 2.7. -Check magnesium, cautious K repletion with ESRD  Transaminitis - She denies hx of alcohol  abuse history.  AST 97, ALT 56.  Asthma/Cough- clear lung exam.  Mild cough for the past few days she attributes to weather change.  No other respiratory symptoms. -Check COVID/influenza/RSV  ESRD on hemodialysis-every Monday Wednesday Friday.  Reports compliance with HD sessions.  Last HD was today.  History of SLE and diabetes.  Still makes some urine. -Consult nephrology for HD.  Hypertension-stable.  Not on antihypertensives. -Resume midodrine   Seizure disorder -Reports compliance with Dilantin , resume  Diabetes mellitus-not on medication.   DVT prophylaxis: SCDS Code Status: FULL code Family Communication: None at bedside Disposition Plan: ~ 2 days Consults called: GI Admission status:  Obs tele   Author: Tully FORBES Carwin, MD 12/07/2023 10:05 PM  For on call review [email or web address].

## 2023-12-07 NOTE — ED Notes (Signed)
 Date and time results received: 12/07/23 1758   Test: Fecal Occult Blood Critical Value: Positive  Name of Provider Notified: Cleotilde Rogue, MD

## 2023-12-07 NOTE — ED Triage Notes (Addendum)
 Pt at dialysis and received full tx. Pt states feeling weak and dark colored stools x 2 days. Pt endorses a cough and congestion; denies being around anyone sick and fever.   CBG 150 144/74 92 HR 18-20 RR

## 2023-12-08 ENCOUNTER — Observation Stay (HOSPITAL_COMMUNITY)

## 2023-12-08 ENCOUNTER — Observation Stay (HOSPITAL_COMMUNITY): Admitting: Certified Registered Nurse Anesthetist

## 2023-12-08 ENCOUNTER — Encounter (HOSPITAL_COMMUNITY)
Admission: EM | Disposition: A | Discharge status: Home / Self Care | Payer: Self-pay | Source: Home / Self Care | Attending: Family Medicine

## 2023-12-08 DIAGNOSIS — Z992 Dependence on renal dialysis: Secondary | ICD-10-CM | POA: Diagnosis not present

## 2023-12-08 DIAGNOSIS — F17210 Nicotine dependence, cigarettes, uncomplicated: Secondary | ICD-10-CM | POA: Diagnosis present

## 2023-12-08 DIAGNOSIS — G40909 Epilepsy, unspecified, not intractable, without status epilepticus: Secondary | ICD-10-CM | POA: Diagnosis present

## 2023-12-08 DIAGNOSIS — U071 COVID-19: Secondary | ICD-10-CM | POA: Diagnosis present

## 2023-12-08 DIAGNOSIS — K921 Melena: Secondary | ICD-10-CM

## 2023-12-08 DIAGNOSIS — E876 Hypokalemia: Secondary | ICD-10-CM | POA: Diagnosis present

## 2023-12-08 DIAGNOSIS — Z604 Social exclusion and rejection: Secondary | ICD-10-CM | POA: Diagnosis present

## 2023-12-08 DIAGNOSIS — M329 Systemic lupus erythematosus, unspecified: Secondary | ICD-10-CM | POA: Diagnosis present

## 2023-12-08 DIAGNOSIS — Z841 Family history of disorders of kidney and ureter: Secondary | ICD-10-CM | POA: Diagnosis not present

## 2023-12-08 DIAGNOSIS — K2971 Gastritis, unspecified, with bleeding: Secondary | ICD-10-CM | POA: Diagnosis present

## 2023-12-08 DIAGNOSIS — K2970 Gastritis, unspecified, without bleeding: Secondary | ICD-10-CM

## 2023-12-08 DIAGNOSIS — K922 Gastrointestinal hemorrhage, unspecified: Secondary | ICD-10-CM | POA: Diagnosis present

## 2023-12-08 DIAGNOSIS — J45909 Unspecified asthma, uncomplicated: Secondary | ICD-10-CM | POA: Diagnosis present

## 2023-12-08 DIAGNOSIS — Z7982 Long term (current) use of aspirin: Secondary | ICD-10-CM | POA: Diagnosis not present

## 2023-12-08 DIAGNOSIS — E1122 Type 2 diabetes mellitus with diabetic chronic kidney disease: Secondary | ICD-10-CM | POA: Diagnosis present

## 2023-12-08 DIAGNOSIS — F71 Moderate intellectual disabilities: Secondary | ICD-10-CM | POA: Diagnosis present

## 2023-12-08 DIAGNOSIS — Z723 Lack of physical exercise: Secondary | ICD-10-CM | POA: Diagnosis not present

## 2023-12-08 DIAGNOSIS — R7401 Elevation of levels of liver transaminase levels: Secondary | ICD-10-CM | POA: Diagnosis present

## 2023-12-08 DIAGNOSIS — Z86718 Personal history of other venous thrombosis and embolism: Secondary | ICD-10-CM | POA: Diagnosis not present

## 2023-12-08 DIAGNOSIS — N186 End stage renal disease: Secondary | ICD-10-CM | POA: Diagnosis present

## 2023-12-08 DIAGNOSIS — I10 Essential (primary) hypertension: Secondary | ICD-10-CM

## 2023-12-08 DIAGNOSIS — Z79899 Other long term (current) drug therapy: Secondary | ICD-10-CM | POA: Diagnosis not present

## 2023-12-08 DIAGNOSIS — E785 Hyperlipidemia, unspecified: Secondary | ICD-10-CM | POA: Diagnosis present

## 2023-12-08 DIAGNOSIS — Z8269 Family history of other diseases of the musculoskeletal system and connective tissue: Secondary | ICD-10-CM | POA: Diagnosis not present

## 2023-12-08 DIAGNOSIS — D649 Anemia, unspecified: Secondary | ICD-10-CM | POA: Diagnosis not present

## 2023-12-08 DIAGNOSIS — F32A Depression, unspecified: Secondary | ICD-10-CM | POA: Diagnosis present

## 2023-12-08 DIAGNOSIS — D62 Acute posthemorrhagic anemia: Secondary | ICD-10-CM | POA: Diagnosis present

## 2023-12-08 DIAGNOSIS — Z7902 Long term (current) use of antithrombotics/antiplatelets: Secondary | ICD-10-CM | POA: Diagnosis not present

## 2023-12-08 DIAGNOSIS — I120 Hypertensive chronic kidney disease with stage 5 chronic kidney disease or end stage renal disease: Secondary | ICD-10-CM | POA: Diagnosis present

## 2023-12-08 HISTORY — PX: ESOPHAGOGASTRODUODENOSCOPY: SHX5428

## 2023-12-08 LAB — RESP PANEL BY RT-PCR (RSV, FLU A&B, COVID)  RVPGX2
Influenza A by PCR: NEGATIVE
Influenza B by PCR: NEGATIVE
Resp Syncytial Virus by PCR: NEGATIVE
SARS Coronavirus 2 by RT PCR: POSITIVE — AB

## 2023-12-08 LAB — BPAM RBC
Blood Product Expiration Date: 202510182359
ISSUE DATE / TIME: 202509221917
Unit Type and Rh: 5100

## 2023-12-08 LAB — CBC
HCT: 23.9 % — ABNORMAL LOW (ref 36.0–46.0)
Hemoglobin: 8.3 g/dL — ABNORMAL LOW (ref 12.0–15.0)
MCH: 27.6 pg (ref 26.0–34.0)
MCHC: 34.7 g/dL (ref 30.0–36.0)
MCV: 79.4 fL — ABNORMAL LOW (ref 80.0–100.0)
Platelets: 175 K/uL (ref 150–400)
RBC: 3.01 MIL/uL — ABNORMAL LOW (ref 3.87–5.11)
RDW: 16.8 % — ABNORMAL HIGH (ref 11.5–15.5)
WBC: 6.9 K/uL (ref 4.0–10.5)
nRBC: 0 % (ref 0.0–0.2)

## 2023-12-08 LAB — HEMOGLOBIN AND HEMATOCRIT, BLOOD
HCT: 23.3 % — ABNORMAL LOW (ref 36.0–46.0)
Hemoglobin: 8 g/dL — ABNORMAL LOW (ref 12.0–15.0)

## 2023-12-08 LAB — TYPE AND SCREEN
ABO/RH(D): O POS
Antibody Screen: NEGATIVE
Unit division: 0

## 2023-12-08 LAB — COMPREHENSIVE METABOLIC PANEL WITH GFR
ALT: 65 U/L — ABNORMAL HIGH (ref 0–44)
AST: 114 U/L — ABNORMAL HIGH (ref 15–41)
Albumin: 3.1 g/dL — ABNORMAL LOW (ref 3.5–5.0)
Alkaline Phosphatase: 48 U/L (ref 38–126)
Anion gap: 11 (ref 5–15)
BUN: 20 mg/dL (ref 6–20)
CO2: 26 mmol/L (ref 22–32)
Calcium: 8.4 mg/dL — ABNORMAL LOW (ref 8.9–10.3)
Chloride: 100 mmol/L (ref 98–111)
Creatinine, Ser: 5.03 mg/dL — ABNORMAL HIGH (ref 0.44–1.00)
GFR, Estimated: 10 mL/min — ABNORMAL LOW (ref >60)
Glucose, Bld: 90 mg/dL (ref 70–99)
Potassium: 2.9 mmol/L — ABNORMAL LOW (ref 3.5–5.1)
Sodium: 137 mmol/L (ref 135–145)
Total Bilirubin: 0.6 mg/dL (ref 0.0–1.2)
Total Protein: 6.2 g/dL — ABNORMAL LOW (ref 6.5–8.1)

## 2023-12-08 LAB — HIV ANTIBODY (ROUTINE TESTING W REFLEX): HIV Screen 4th Generation wRfx: NONREACTIVE

## 2023-12-08 SURGERY — EGD (ESOPHAGOGASTRODUODENOSCOPY)
Anesthesia: General

## 2023-12-08 MED ORDER — POLYETHYLENE GLYCOL 3350 17 G PO PACK: 17.0000 g | PACK | Freq: Every day | ORAL | Status: DC | PRN

## 2023-12-08 MED ORDER — ONDANSETRON HCL 4 MG PO TABS: 4.0000 mg | ORAL_TABLET | Freq: Four times a day (QID) | ORAL | Status: DC | PRN

## 2023-12-08 MED ORDER — ACETAMINOPHEN 325 MG PO TABS
650.0000 mg | ORAL_TABLET | Freq: Four times a day (QID) | ORAL | Status: DC | PRN
  Administered 2023-12-08: 650 mg via ORAL
  Filled 2023-12-08: qty 2

## 2023-12-08 MED ORDER — PROPOFOL 10 MG/ML IV BOLUS
INTRAVENOUS | Status: DC | PRN
Start: 2023-12-08 — End: 2023-12-08
  Administered 2023-12-08: 50 mg via INTRAVENOUS
  Administered 2023-12-08: 25 mg via INTRAVENOUS
  Administered 2023-12-08: 50 mg via INTRAVENOUS

## 2023-12-08 MED ORDER — SIMVASTATIN 20 MG PO TABS
40.0000 mg | ORAL_TABLET | Freq: Every day | ORAL | Status: DC
Start: 2023-12-08 — End: 2023-12-08

## 2023-12-08 MED ORDER — ACETAMINOPHEN 650 MG RE SUPP: 650.0000 mg | Freq: Four times a day (QID) | RECTAL | Status: DC | PRN

## 2023-12-08 MED ORDER — STERILE WATER FOR IRRIGATION IR SOLN
Status: DC | PRN
  Administered 2023-12-08: 60 mL

## 2023-12-08 MED ORDER — SODIUM CHLORIDE 0.9 % IV SOLN: INTRAVENOUS | Status: DC

## 2023-12-08 MED ORDER — MIDODRINE HCL 5 MG PO TABS
10.0000 mg | ORAL_TABLET | Freq: Two times a day (BID) | ORAL | Status: DC
  Administered 2023-12-08 – 2023-12-10 (×5): 10 mg via ORAL
  Filled 2023-12-08 (×5): qty 2

## 2023-12-08 MED ORDER — CALCITRIOL 0.25 MCG PO CAPS
1.7500 ug | ORAL_CAPSULE | ORAL | Status: DC
Start: 2023-12-09 — End: 2023-12-10

## 2023-12-08 MED ORDER — SERTRALINE HCL 50 MG PO TABS
200.0000 mg | ORAL_TABLET | Freq: Every day | ORAL | Status: DC
  Administered 2023-12-08 – 2023-12-10 (×3): 200 mg via ORAL
  Filled 2023-12-08 (×3): qty 4

## 2023-12-08 MED ORDER — PHENYTOIN SODIUM EXTENDED 100 MG PO CAPS
100.0000 mg | ORAL_CAPSULE | Freq: Two times a day (BID) | ORAL | Status: DC
  Administered 2023-12-08 – 2023-12-10 (×5): 100 mg via ORAL
  Filled 2023-12-08 (×6): qty 1

## 2023-12-08 MED ORDER — RENA-VITE PO TABS
1.0000 | ORAL_TABLET | Freq: Every day | ORAL | Status: DC
Start: 2023-12-08 — End: 2023-12-10
  Administered 2023-12-08 – 2023-12-10 (×3): 1 via ORAL
  Filled 2023-12-08 (×3): qty 1

## 2023-12-08 MED ORDER — CHLORHEXIDINE GLUCONATE CLOTH 2 % EX PADS
6.0000 | MEDICATED_PAD | Freq: Every day | CUTANEOUS | Status: DC
Start: 2023-12-08 — End: 2023-12-10
  Administered 2023-12-08 – 2023-12-09 (×2): 6 via TOPICAL

## 2023-12-08 MED ORDER — MIDODRINE HCL 5 MG PO TABS
10.0000 mg | ORAL_TABLET | Freq: Two times a day (BID) | ORAL | Status: DC
Start: 2023-12-08 — End: 2023-12-08

## 2023-12-08 MED ORDER — ONDANSETRON HCL 4 MG/2ML IJ SOLN: 4.0000 mg | Freq: Four times a day (QID) | INTRAMUSCULAR | Status: DC | PRN

## 2023-12-08 MED ORDER — POTASSIUM CHLORIDE CRYS ER 20 MEQ PO TBCR
20.0000 meq | EXTENDED_RELEASE_TABLET | Freq: Two times a day (BID) | ORAL | Status: AC
  Administered 2023-12-08 (×2): 20 meq via ORAL
  Filled 2023-12-08 (×2): qty 1

## 2023-12-08 NOTE — TOC Initial Note (Signed)
 Transition of Care [facility]) - Initial/Assessment Note    Patient Details  Name: [name] MRN: [id number] Date of Birth: 11-20-1975  Transition of [facility]) CM/SW Contact:    Hoy DELENA Bigness, LCSW Phone Number: 12/08/2023, 12:26 PM  Clinical Narrative:                 Pt from [facility] group home #[facility]. CSW spoke with pt's legal guardian, Ms Dewayne who confirmed plan for pt to return to her [facility] at discharge. She shares pt has a rollator and denies pt receiving and home services.  Spoke with Randie who is over pt's group home and confirmed pt is able to return at discharge. Pt will need FL-2 and will need to confirm that she can ride on [facility] to HD at discharge with COVID + status.   Expected Discharge Plan: Group Home Barriers to Discharge: Continued Medical Work up   Patient Goals and CMS Choice Patient states their goals for this hospitalization and ongoing recovery are:: For pt to return to her group home CMS Medicare.gov Compare Post Acute Care list provided to:: Legal Guardian Choice offered to / list presented to : [facility] POA / Guardian [place] ownership interest in [facility].provided to::  (NA)    Expected Discharge Plan and Services In-house Referral: Clinical Social Work Discharge Planning Services: NA Post Acute Care Choice: NA Living arrangements for the past 2 months: Group Home                 DME Arranged: N/A DME Agency: NA                  Prior Living Arrangements/Services Living arrangements for the past 2 months: Group Home Lives with:: Facility Resident Patient language and need for interpreter reviewed:: Yes Do you feel safe going back to the place where you live?: Yes      Need for Family Participation in Patient Care: Yes (Comment) Care giver support system in place?: Yes (comment) Current home services: DME (Rollator) Criminal Activity/Legal Involvement Pertinent to Current Situation/Hospitalization: No - Comment  as needed  Activities of Daily Living   ADL Screening (condition at time of admission) Independently performs ADLs?: Yes (appropriate for developmental age) Is the patient deaf or have difficulty hearing?: No Does the patient have difficulty seeing, even when wearing glasses/contacts?: No Does the patient have difficulty concentrating, remembering, or making decisions?: No  Permission Sought/Granted Permission sought to share information with : Guardian, Magazine features editor Permission granted to share information with : Yes, Verbal Permission Granted     Permission granted to share info w AGENCY: [facility]        Emotional Assessment Appearance:: Appears stated age Attitude/Demeanor/Rapport: Unable to Assess Affect (typically observed): Unable to Assess Orientation: : Oriented to Self, Oriented to Place, Oriented to Situation, Oriented to  Time Alcohol  / Substance Use: Not Applicable Psych Involvement: No (comment)  Admission diagnosis:  Acute GI bleeding [K92.2] Patient Active Problem List   Diagnosis Date Noted   Acute GI bleeding 12/07/2023   Acute anemia 12/07/2023   Encounter for screening fecal occult blood testing 07/01/2022   Encounter for gynecological examination with Papanicolaou smear of cervix 07/01/2022   Hemorrhage from arteriovenous dialysis graft 02/08/2022   Chronic hypotension 02/08/2022   Diabetes (HCC) 11/02/2018   Lipoma of extremity    Hyperkalemia 10/23/2016   Complication of vascular access for dialysis 04/01/2016   Seizure disorder (HCC) 10/01/2015  Generalized muscle weakness 10/01/2015   Vaginal discharge 11/08/2013   Vaginal odor 11/08/2013   BV (bacterial vaginosis) 11/08/2013   ESRD on hemodialysis (HCC) 12/27/2010   Anemia in chronic kidney disease 12/27/2010   Major depressive disorder with single episode 12/27/2010   Secondary hyperparathyroidism of renal origin 12/27/2010   Systemic lupus erythematosus,  organ or system involvement unspecified (HCC) 12/27/2010   Type 2 diabetes mellitus without complication 12/27/2010   SWELLING, NECK 04/02/2007   Asthma 03/15/2007   FOOT PAIN, RIGHT 03/15/2007   Cough 03/15/2007   MENTAL RETARDATION, MODERATE 01/19/2007   STATUS, MENTAL, ALTERED 07/16/2006   ANOREXIA 05/26/2006   Disease of pericardium 05/15/2006   Tachycardia 05/15/2006   Lupus 04/01/2006   ANEMIA-NOS 03/31/2006   LEUKOCYTOSIS 03/31/2006   Depression with anxiety 03/31/2006   Migraine headache 03/31/2006   Hereditary and idiopathic peripheral neuropathy 03/31/2006   Essential (primary) hypertension 03/31/2006   GERD 03/31/2006   Osteoarthritis 03/31/2006   Other specified abnormal findings of blood chemistry 03/31/2006   PCP:  Wilmon Penton, NP Pharmacy:   [facility], [place] - [address] [place] [address] Phone: [phone] Fax: [phone]     Social Drivers of Health (SDOH) Social History: SDOH Screenings   Food Insecurity: No Food Insecurity (12/08/2023)  Housing: Low Risk  (12/08/2023)  Transportation Needs: No Transportation Needs (12/08/2023)  Utilities: Not At Risk (12/08/2023)  Alcohol  Screen: Low Risk  (07/01/2022)  Depression (PHQ2-9): Low Risk  (07/01/2022)  Financial Resource Strain: Low Risk  (07/01/2022)  Physical Activity: Inactive (07/01/2022)  Social Connections: Socially Isolated (07/01/2022)  Stress: No Stress Concern Present (07/01/2022)  Tobacco Use: High Risk (12/07/2023)   SDOH Interventions: Social Connections Interventions: Inpatient TOC, Intervention Not Indicated   Readmission Risk Interventions     No data to display

## 2023-12-08 NOTE — Progress Notes (Signed)
 Pt receives out-pt HD at [facility] dialysis, mwf, 0600 chair time. Wilc ontinue to assist as needed.

## 2023-12-08 NOTE — Plan of Care (Signed)

## 2023-12-08 NOTE — Progress Notes (Signed)
 PROGRESS NOTE    [name]  FMW:[id number] DOB: June 23, 1975 DOA: 12/07/2023 PCP: Wilmon Penton, NP   Brief Narrative:  48 year old female with history of  ESRD on dialysis, diabetes mellitus, DVT, hypertension, seizure, SLE, moderate intellectual disabilities presented with black stools.  On presentation, hemoglobin was 7 down from baseline of more than 12, last checked a year ago with AST of 97 8, ALT of 56.  She was transfused 1 unit packed red cells.  GI was consulted.  He was started on IV Protonix .  Assessment & Plan:   Possible upper GI bleeding Acute blood loss anemia -On presentation, hemoglobin was 7 down from baseline of more than 12, last checked a year ago.  Unclear what her recent baseline is. -Transfused 1 unit packed red cells on admission.  Hemoglobin 8.3 today.  Continue IV Protonix .  Continue NPO.  Await GI recommendations.  Aspirin and Plavix  on hold  Hypokalemia - Potassium 2.9 this morning.  Cautious potassium repletion in patient with ESRD  ESRD on hemodialysis - Last hemodialysis was yesterday.  Consult nephrology to continue inpatient dialysis  Hypertension - Monitor blood pressure.  Not on any antihypertensives.  Looks like she is on midodrine  for intermittent hypotension which has been continued  Acute transaminitis - Questionable cause.  Slightly worsening today.  Monitor.  Check hepatitis panel in AM.  Hold statin  Hyperlipidemia - Hold statin as above  COVID-19 infection - Tested positive for COVID on 12/03/2023 as an outpatient.  Will check chest x-ray.  Currently on room air.  Check inflammatory markers in AM.  Start isolation.  Seizure disorder - Continue phenytoin   Diabetes mellitus type 2 -Possibly diet controlled.  Blood sugars currently stable.  DVT prophylaxis: SCDs Code Status: Full Family Communication: None at bedside Disposition Plan: Status is: Observation The patient will require care spanning > 2 midnights and should be  moved to inpatient because: Of severity of illness    Consultants: GI called by ED provider.  Consult nephrology  Procedures: None  Antimicrobials: None   Subjective: Patient seen and examined at bedside.  Has intermittent cough.  No fever, vomiting, abdominal pain reported.  Denies any black or bloody stools overnight.  Objective: Vitals:   12/08/23 0111 12/08/23 0130 12/08/23 0307 12/08/23 0643  BP:  115/87 (!) 142/72 123/68  Pulse:  93 98 91  Resp:  20 18 14   Temp:   99.8 F (37.7 C) 99 F (37.2 C)  TempSrc:   Oral Oral  SpO2: 100% 100% 93% 92%  Weight:      Height:        Intake/Output Summary (Last 24 hours) at 12/08/2023 0748 Last data filed at 12/08/2023 0111 Gross per 24 hour  Intake 373.5 ml  Output --  Net 373.5 ml   Filed Weights   12/07/23 1656  Weight: 58 kg    Examination:  General exam: Appears calm and comfortable  Respiratory system: Bilateral decreased breath sounds at bases with scattered crackles Cardiovascular system: S1 & S2 heard, Rate controlled Gastrointestinal system: Abdomen is nondistended, soft and nontender. Normal bowel sounds heard. Extremities: No cyanosis, clubbing, edema  Central nervous system: Alert and oriented. No focal neurological deficits. Moving extremities Skin: No rashes, lesions or ulcers Psychiatry: Flat affect.  Not agitated.    Data Reviewed: I have personally reviewed following labs and imaging studies  CBC: Recent Labs  Lab 12/07/23 1722 12/08/23 0001 12/08/23 0536  WBC 4.7  --  6.9  HGB 7.0* 8.0* 8.3*  HCT 20.5* 23.3* 23.9*  MCV 80.4  --  79.4*  PLT 189  --  175   Basic Metabolic Panel: Recent Labs  Lab 12/07/23 1722 12/08/23 0536  NA 138 137  K 2.7* 2.9*  CL 96* 100  CO2 27 26  GLUCOSE 96 90  BUN 17 20  CREATININE 4.00* 5.03*  CALCIUM 8.7* 8.4*  MG 1.8  --    GFR: Estimated Creatinine Clearance: 11.3 mL/min (A) (by C-G formula based on SCr of 5.03 mg/dL (H)). Liver Function  Tests: Recent Labs  Lab 12/07/23 1722 12/08/23 0536  AST 97* 114*  ALT 56* 65*  ALKPHOS 58 48  BILITOT 0.5 0.6  PROT 6.7 6.2*  ALBUMIN  3.5 3.1*   No results for input(s): LIPASE, AMYLASE in the last 168 hours. No results for input(s): AMMONIA in the last 168 hours. Coagulation Profile: No results for input(s): INR, PROTIME in the last 168 hours. Cardiac Enzymes: No results for input(s): CKTOTAL, CKMB, CKMBINDEX, TROPONINI in the last 168 hours. BNP (last 3 results) No results for input(s): PROBNP in the last 8760 hours. HbA1C: No results for input(s): HGBA1C in the last 72 hours. CBG: No results for input(s): GLUCAP in the last 168 hours. Lipid Profile: No results for input(s): CHOL, HDL, LDLCALC, TRIG, CHOLHDL, LDLDIRECT in the last 72 hours. Thyroid  Function Tests: No results for input(s): TSH, T4TOTAL, FREET4, T3FREE, THYROIDAB in the last 72 hours. Anemia Panel: No results for input(s): VITAMINB12, FOLATE, FERRITIN, TIBC, IRON, RETICCTPCT in the last 72 hours. Sepsis Labs: No results for input(s): PROCALCITON, LATICACIDVEN in the last 168 hours.  Recent Results (from the past 240 hours)  Resp panel by RT-PCR (RSV, Flu A&B, Covid) Anterior Nasal Swab     Status: Abnormal   Collection Time: 12/07/23  8:16 PM   Specimen: Anterior Nasal Swab  Result Value Ref Range Status   SARS Coronavirus 2 by RT PCR POSITIVE (A) NEGATIVE Final    Comment: (NOTE) SARS-CoV-2 target nucleic acids are DETECTED.  The SARS-CoV-2 RNA is generally detectable in upper respiratory specimens during the acute phase of infection. Positive results are indicative of the presence of the identified virus, but do not rule out bacterial infection or co-infection with other pathogens not detected by the test. Clinical correlation with patient history and other diagnostic information is necessary to determine patient infection status. The  expected result is Negative.  Fact Sheet for Patients: BloggerCourse.com  Fact Sheet for Healthcare Providers: SeriousBroker.it  This test is not yet approved or cleared by the [place]  FDA and  has been authorized for detection and/or diagnosis of SARS-CoV-2 by FDA under an Emergency Use Authorization (EUA).  This EUA will remain in effect (meaning this test can be used) for the duration of  the COVID-19 declaration under Section 564(b)(1) of the A ct, 21 U.S.C. section 360bbb-3(b)(1), unless the authorization is terminated or revoked sooner.     Influenza A by PCR NEGATIVE NEGATIVE Final   Influenza B by PCR NEGATIVE NEGATIVE Final    Comment: (NOTE) The Xpert Xpress SARS-CoV-2/FLU/RSV plus assay is intended as an aid in the diagnosis of influenza from Nasopharyngeal swab specimens and should not be used as a sole basis for treatment. Nasal washings and aspirates are unacceptable for Xpert Xpress SARS-CoV-2/FLU/RSV testing.  Fact Sheet for Patients: BloggerCourse.com  Fact Sheet for Healthcare Providers: SeriousBroker.it  This test is not yet approved or cleared by the [place]  FDA and has been authorized for detection and/or diagnosis of  SARS-CoV-2 by FDA under an Emergency Use Authorization (EUA). This EUA will remain in effect (meaning this test can be used) for the duration of the COVID-19 declaration under Section 564(b)(1) of the Act, 21 U.S.C. section 360bbb-3(b)(1), unless the authorization is terminated or revoked.     Resp Syncytial Virus by PCR NEGATIVE NEGATIVE Final    Comment: (NOTE) Fact Sheet for Patients: BloggerCourse.com  Fact Sheet for Healthcare Providers: SeriousBroker.it  This test is not yet approved or cleared by the [place]  FDA and has been authorized for detection and/or  diagnosis of SARS-CoV-2 by FDA under an Emergency Use Authorization (EUA). This EUA will remain in effect (meaning this test can be used) for the duration of the COVID-19 declaration under Section 564(b)(1) of the Act, 21 U.S.C. section 360bbb-3(b)(1), unless the authorization is terminated or revoked.  Performed at [facility], [address], [place], [place] [address]          Radiology Studies: No results found.      Scheduled Meds:  midodrine   10 mg Oral BID WC   multivitamin  1 tablet Oral Daily   pantoprazole  (PROTONIX ) IV  40 mg Intravenous Q12H   phenytoin   100 mg Oral BID   sertraline   200 mg Oral Daily   simvastatin   40 mg Oral QHS   Continuous Infusions:        Sophie Mao, MD [facility] 12/08/2023, 7:48 AM

## 2023-12-08 NOTE — Brief Op Note (Signed)
 12/08/2023  3:14 PM  PATIENT:  [name]  48 y.o. female  PRE-OPERATIVE DIAGNOSIS:  anemia, melena, heme positive stool  POST-OPERATIVE DIAGNOSIS:  gastritis  PROCEDURE:  Procedure(s): EGD (ESOPHAGOGASTRODUODENOSCOPY) (N/A)  SURGEON:  Surgeons and Role:    * Eartha Angelia Sieving, MD - Primary  Patient underwent EGD under propofol  sedation.  Tolerated the procedure adequately.   FINDINGS: - Normal esophagus.  - Gastritis.  - Normal examined duodenum.   RECOMMENDATIONS - Return patient to hospital ward for ongoing care.  - Resume previous diet.  - Await pathology results.  - Use Protonix  (pantoprazole ) 40 mg PO BID.  - May restart Plavix  tomorrow AM. - If presenting recurrent melena/hematochezia, may consider inpatient colonoscopy - otherwise may need to perform this as outpatient. - Daily H/H  Sieving Eartha, MD Gastroenterology and Hepatology [facility]

## 2023-12-08 NOTE — Op Note (Signed)
 [facility] Patient Name: [name] Procedure Date: 12/08/2023 2:41 PM MRN: [id number] Date of Birth: [birth year] Attending MD: Toribio Fortune , , [id number] CSN: [id number] Age: 48 Admit Type: Inpatient Procedure:                Upper GI endoscopy Indications:              Melena Providers:                Toribio Fortune, Madelin Hunter, RN, Jon Loge Referring MD:              Medicines:                Monitored Anesthesia Care Complications:            No immediate complications. Estimated Blood Loss:     Estimated blood loss: none. Procedure:                Pre-Anesthesia Assessment:                           - Prior to the procedure, a History and Physical                            was performed, and patient medications, allergies                            and sensitivities were reviewed. The patient's                            tolerance of previous anesthesia was reviewed.                           - The risks and benefits of the procedure and the                            sedation options and risks were discussed with the                            patient. All questions were answered and informed                            consent was obtained.                           - ASA Grade Assessment: III - A patient with severe                            systemic disease.                           After obtaining informed consent, the endoscope was                            passed under direct vision. Throughout the                            procedure, the patient's blood pressure, pulse,  and                            oxygen saturations were monitored continuously. The                            [id number] ([id number]) Upper was introduced through                            the mouth, and advanced to the second part of                            duodenum. The upper GI endoscopy was accomplished                            without difficulty. The patient  tolerated the                            procedure well. Scope In: 2:59:28 PM Scope Out: 3:05:44 PM Total Procedure Duration: 0 hours 6 minutes 16 seconds  Findings:      The examined esophagus was normal.      Patchy moderate inflammation characterized by congestion (edema) and       erythema was found in the gastric body.      The examined duodenum was normal. Impression:               - Normal esophagus.                           - Gastritis.                           - Normal examined duodenum.                           - No specimens collected. Moderate Sedation:      Per Anesthesia Care Recommendation:           - Return patient to hospital ward for ongoing care.                           - Resume previous diet.                           - Await pathology results.                           - Use Protonix  (pantoprazole ) 40 mg PO BID.                           - May restart Plavix  tomorrow AM.                           - If presenting recurrent melena/hematochezia, may                            consider inpatient colonoscopy - otherwise may need  to perform this as outpatient.                           - Daily H/H Procedure Code(s):        --- Professional ---                           [phone], Esophagogastroduodenoscopy, flexible,                            transoral; diagnostic, including collection of                            specimen(s) by brushing or washing, when performed                            (separate procedure) Diagnosis Code(s):        --- Professional ---                           K29.70, Gastritis, unspecified, without bleeding                           K92.1, Melena (includes Hematochezia) CPT copyright 2022 American Medical Association. All rights reserved. The codes documented in this report are preliminary and upon coder review may  be revised to meet current compliance requirements. Toribio Fortune, MD Toribio Fortune,   12/08/2023 3:15:42 PM This report has been signed electronically. Number of Addenda: 0

## 2023-12-08 NOTE — Care Management Obs Status (Addendum)
 MEDICARE OBSERVATION STATUS NOTIFICATION   Patient Details  Name: [name] MRN: [id number] Date of Birth: Aug 21, 1975   Medicare Observation Status Notification Given:  Yes  Certified Copy mailed to [facility]    [address], [place], [place] [address]  Duwaine LITTIE Ada 12/08/2023, 4:48 PM

## 2023-12-08 NOTE — ED Notes (Signed)
 ED TO INPATIENT HANDOFF REPORT  ED Nurse Name and Phone #: [place] [phone]  S Name/Age/Gender [name] 48 y.o. female Room/Bed: APA18/APA18  Code Status   Code Status: Full Code  Home/SNF/Other Home Patient oriented to: self, place, time, and situation Is this baseline? Yes   Triage Complete: Triage complete  Chief Complaint Acute GI bleeding [K92.2]  Triage Note Pt at dialysis and received full tx. Pt states feeling weak and dark colored stools x 2 days. Pt endorses a cough and congestion; denies being around anyone sick and fever.   CBG 150 144/74 92 HR 18-20 RR   Allergies Allergies  Allergen Reactions   Dust Mite Extract Other (See Comments)    sneezing    Level of Care/Admitting Diagnosis ED Disposition     ED Disposition  Admit   Condition  --   Comment  Hospital Area: [facility] [[id number]]  Level of Care: Telemetry [5]  Covid Evaluation: Asymptomatic - no recent exposure (last 10 days) testing not required  Diagnosis: Acute GI bleeding [746831]  Admitting Physician: EMOKPAE, EJIROGHENE E [[id number]  Attending Physician: EMOKPAE, EJIROGHENE E [phone]  For patients discharging to extended facilities (i.e. SNF, AL, group homes or LTAC) initiate:: Discharge to SNF/Facility Placement COVID-19 Lab Testing Protocol          B Medical/Surgery History Past Medical History:  Diagnosis Date   Anemia    BV (bacterial vaginosis) 11/08/2013   Depression    Dialysis patient    DVT (deep venous thrombosis) (HCC)    Hypertension    Lupus    Renal disorder    Vaginal odor 11/08/2013   Past Surgical History:  Procedure Laterality Date   A/V SHUNT INTERVENTION N/A 10/24/2016   Procedure: A/V SHUNT INTERVENTION;  Surgeon: Jama Cordella MATSU, MD;  Location: [facility];  Service: Cardiovascular;  Laterality: N/A;   A/V SHUNT INTERVENTION Left 09/15/2023   Procedure: A/V SHUNT INTERVENTION;  Surgeon: Lanis Fonda BRAVO, MD;  Location: [facility];  Service: Cardiovascular;  Laterality: Left;   A/V SHUNTOGRAM Left 12/08/2017   Procedure: A/V SHUNTOGRAM;  Surgeon: Jama Cordella MATSU, MD;  Location: [facility];  Service: Cardiovascular;  Laterality: Left;   A/V SHUNTOGRAM Left 01/28/2018   Procedure: A/V SHUNTOGRAM;  Surgeon: [name], MD;  Location: [facility];  Service: Cardiovascular;  Laterality: Left;   A/V SHUNTOGRAM Left 04/01/2018   Procedure: A/V SHUNTOGRAM;  Surgeon: [name], MD;  Location: [facility];  Service: Cardiovascular;  Laterality: Left;   A/V SHUNTOGRAM Left 04/28/2019   Procedure: A/V SHUNTOGRAM;  Surgeon: [name], MD;  Location: [facility];  Service: Cardiovascular;  Laterality: Left;   AV FISTULA PLACEMENT     AV FISTULA PLACEMENT Left 02/09/2022   Procedure: FISTULAGRAM, placement of left arm stent.;  Surgeon: [name], MD;  Location: [facility] OR;  Service: Vascular;  Laterality: Left;   AV FISTULA PLACEMENT Right 09/02/2022   Procedure: EXPLORATION OF RIGHT AXILLA, INADEQUATE VEIN ACCESS;  Surgeon: Oris Krystal FALCON, MD;  Location: [facility];  Service: Vascular;  Laterality: Right;   AV FISTULA PLACEMENT Left 09/25/2022   Procedure: INSERTION OF LEFT FEMORAL LOOP GRAFT USING 4-85mm GORETEX STRETCH GRAFT;  Surgeon: [name], MD;  Location: [facility];  Service: Vascular;  Laterality: Left;   DIALYSIS/PERMA CATHETER INSERTION  10/23/2016   Procedure: DIALYSIS/PERMA CATHETER INSERTION;  Surgeon: [name], MD;  Location: [facility];  Service: Cardiovascular;;   IR AV DIALY SHUNT INTRO NEEDLE/INTRACATH INITIAL W/PTA/IMG LEFT  05/12/2023   IR REMOVAL TUN CV CATH W/O FL  09/22/2017   IR REMOVAL TUN CV CATH W/O FL  01/06/2023   IR THROMBECTOMY AV FISTULA W/THROMBOLYSIS INC/SHUNT/IMG LEFT Left 09/04/2017   IR US  GUIDE VASC ACCESS LEFT  09/04/2017   IR US  GUIDE VASC ACCESS LEFT  05/12/2023   MASS EXCISION Left 06/01/2018   Procedure: EXCISION 5 X 3CM LIPOMA LEFT  THIGH;  Surgeon: [name], MD;  Location: [facility];  Service: General;  Laterality: Left;   PERIPHERAL VASCULAR CATHETERIZATION Left 01/04/2015   Procedure: A/V Shuntogram/Fistulagram;  Surgeon: Selinda GORMAN Gu, MD;  Location: [facility];  Service: Cardiovascular;  Laterality: Left;   PERIPHERAL VASCULAR CATHETERIZATION N/A 01/04/2015   Procedure: A/V Shunt Intervention;  Surgeon: Selinda GORMAN Gu, MD;  Location: [facility];  Service: Cardiovascular;  Laterality: N/A;   PERIPHERAL VASCULAR CATHETERIZATION Left 04/13/2015   Procedure: A/V Shuntogram/Fistulagram;  Surgeon: [name], MD;  Location: [facility];  Service: Cardiovascular;  Laterality: Left;   PERIPHERAL VASCULAR CATHETERIZATION Left 04/13/2015   Procedure: A/V Shunt Intervention;  Surgeon: [name], MD;  Location: [facility];  Service: Cardiovascular;  Laterality: Left;   REMOVAL OF A DIALYSIS CATHETER Left 09/25/2022   Procedure: REMOVAL OF LEFT INTERNAL JUGULAR HERO CATHETER;  Surgeon: [name], MD;  Location: [facility];  Service: Vascular;  Laterality: Left;   UPPER EXTREMITY VENOGRAPHY N/A 08/26/2022   Procedure: UPPER EXTREMITY VENOGRAPHY;  Surgeon: [name], MD;  [facility];  Service: Cardiovascular;  Laterality: N/A;   VENOUS ANGIOPLASTY  09/15/2023   Procedure: VENOUS ANGIOPLASTY;  Surgeon: Lanis Fonda BRAVO, MD;  Location: [facility];  Service: Cardiovascular;;  leg loop graft     A IV Location/Drains/Wounds Patient Lines/Drains/Airways Status     Active Line/Drains/Airways     Name Placement date Placement time Site Days   Peripheral IV 12/07/23 20 G 1 Anterior;Right Forearm 12/07/23  1727  Forearm  1   Fistula / Graft Left Upper arm Arteriovenous vein graft --  --  Upper arm  --   Fistula / Graft Left Upper arm --  --  Upper arm  --   Fistula / Graft Left Upper arm --  --  Upper arm  --   Fistula / Graft Left Femoral vein Arteriovenous vein  graft 09/25/22  0900  Femoral vein  439            Intake/Output Last 24 hours  Intake/Output Summary (Last 24 hours) at 12/08/2023 0148 Last data filed at 12/08/2023 0111 Gross per 24 hour  Intake 373.5 ml  Output --  Net 373.5 ml    Labs/Imaging Results for orders placed or performed during the hospital encounter of 12/07/23 (from the past 48 hours)  Comprehensive metabolic panel     Status: Abnormal   Collection Time: 12/07/23  5:22 PM  Result Value Ref Range   Sodium 138 135 - 145 mmol/L   Potassium 2.7 (LL) 3.5 - 5.1 mmol/L    Comment: CRITICAL RESULT CALLED TO, READ BACK BY AND VERIFIED WITH SAPPELT,J ON 12/07/23 AT 1837 BY LOY,C   Chloride 96 (L) 98 - 111 mmol/L   CO2 27 22 - 32 mmol/L   Glucose, Bld 96 70 - 99 mg/dL    Comment: Glucose reference range applies only to samples taken  after fasting for at least 8 hours.   BUN 17 6 - 20 mg/dL   Creatinine, Ser 5.99 (H) 0.44 - 1.00 mg/dL   Calcium 8.7 (L) 8.9 - 10.3 mg/dL   Total Protein 6.7 6.5 - 8.1 g/dL   Albumin  3.5 3.5 - 5.0 g/dL   AST 97 (H) 15 - 41 U/L   ALT 56 (H) 0 - 44 U/L   Alkaline Phosphatase 58 38 - 126 U/L   Total Bilirubin 0.5 0.0 - 1.2 mg/dL   GFR, Estimated 13 (L) >60 mL/min    Comment: (NOTE) Calculated using the CKD-EPI Creatinine Equation (2021)    Anion gap 15 5 - 15    Comment: Performed at [facility], [address] [place], [place] [address]  CBC     Status: Abnormal   Collection Time: 12/07/23  5:22 PM  Result Value Ref Range   WBC 4.7 4.0 - 10.5 K/uL   RBC 2.55 (L) 3.87 - 5.11 MIL/uL   Hemoglobin 7.0 (L) 12.0 - 15.0 g/dL   HCT 79.4 (L) 63.9 - 53.9 %   MCV 80.4 80.0 - 100.0 fL   MCH 27.5 26.0 - 34.0 pg   MCHC 34.1 30.0 - 36.0 g/dL   RDW 82.5 (H) 88.4 - 84.4 %   Platelets 189 150 - 400 K/uL   nRBC 0.0 0.0 - 0.2 %    Comment: Performed at [facility], [address], [place], [place] [address]  Type and screen [facility]     Status: None (Preliminary result)   Collection  Time: 12/07/23  5:22 PM  Result Value Ref Range   ABO/RH(D) O POS    Antibody Screen NEG    Sample Expiration 12/10/2023,2359    Unit Number [id number]    Blood Component Type RBC LR PHER1    Unit division 00    Status of Unit ISSUED    Transfusion Status OK TO TRANSFUSE    Crossmatch Result      Compatible Performed at [facility], [address] [place], [place] [address]   Magnesium     Status: None   Collection Time: 12/07/23  5:22 PM  Result Value Ref Range   Magnesium 1.8 1.7 - 2.4 mg/dL    Comment: Performed at [facility], [address], [place], [place] [address]  POC occult blood, ED     Status: Abnormal   Collection Time: 12/07/23  5:55 PM  Result Value Ref Range   Positive    Prepare RBC (crossmatch)     Status: None   Collection Time: 12/07/23  6:30 PM  Result Value Ref Range   Order Confirmation      ORDER PROCESSED BY BLOOD BANK Performed at [facility], [address] [place], [place] [address]   Hemoglobin and hematocrit, blood     Status: Abnormal   Collection Time: 12/08/23 12:01 AM  Result Value Ref Range   Hemoglobin 8.0 (L) 12.0 - 15.0 g/dL   HCT 76.6 (L) 63.9 - 53.9 %    Comment: Performed at [facility], [address] [place], [place] [address]   No results found.  Pending Labs Unresulted Labs (From admission, onward)     Start     Ordered   12/08/23 0500  HIV Antibody (routine testing w rflx)  (HIV Antibody (Routine testing w reflex) panel)  Tomorrow morning,   R        12/08/23 0110   12/08/23 0500  Comprehensive metabolic panel  Tomorrow morning,   R        12/08/23 0110   12/08/23 0500  CBC  Tomorrow morning,   R        12/08/23 0110   12/07/23 2016  Resp panel by RT-PCR (RSV, Flu A&B, Covid) Anterior Nasal Swab  (Resp Panel by RT-PCR (RSV, Flu A&B, Covid) with precautions)  Once,   R        12/07/23 2015            Vitals/Pain Today's Vitals   12/07/23 1935 12/07/23 2155 12/07/23 2230 12/08/23 0130  BP: 138/67 136/66  126/65 115/87  Pulse: 96 94 90 93  Resp: 18 (!) 21 (!) 22 20  Temp: 99.2 F (37.3 C) 98.9 F (37.2 C)    TempSrc: Oral     SpO2: 100% 100% 100% 100%  Weight:      Height:      PainSc:        Isolation Precautions Airborne and Contact precautions  Medications Medications  pantoprazole  (PROTONIX ) injection 40 mg (40 mg Intravenous Given 12/07/23 2149)  simvastatin  (ZOCOR ) tablet 40 mg (40 mg Oral Not Given 12/08/23 0110)  sertraline  (ZOLOFT ) tablet 200 mg (has no administration in time range)  phenytoin  (DILANTIN ) ER capsule 100 mg (has no administration in time range)  multivitamin (RENA-VIT) tablet 1 tablet (has no administration in time range)  acetaminophen  (TYLENOL ) tablet 650 mg (has no administration in time range)    Or  acetaminophen  (TYLENOL ) suppository 650 mg (has no administration in time range)  ondansetron  (ZOFRAN ) tablet 4 mg (has no administration in time range)    Or  ondansetron  (ZOFRAN ) injection 4 mg (has no administration in time range)  polyethylene glycol (MIRALAX  / GLYCOLAX ) packet 17 g (has no administration in time range)  midodrine  (PROAMATINE ) tablet 10 mg (has no administration in time range)  0.9 %  sodium chloride  infusion (Manually program via Guardrails IV Fluids) (0 mLs Intravenous Stopped 12/08/23 0111)  potassium chloride  SA (KLOR-CON  M) CR tablet 20 mEq (20 mEq Oral Given 12/07/23 2149)    Mobility walks with person assist     Focused Assessments Renal Assessment Handoff:  Hemodialysis Schedule:  Last Hemodialysis date and time:    Restricted appendage: left arm   R Recommendations: See Admitting Provider Note  Report given to:   Additional Notes:

## 2023-12-08 NOTE — Progress Notes (Signed)
 Voicemail left for pts guardian- Mliss Rings for notification of admission. Contact left on voicemail for any questions or concerns.

## 2023-12-08 NOTE — Anesthesia Preprocedure Evaluation (Addendum)
 Anesthesia Evaluation  Patient identified by MRN, date of birth, ID band Patient awake    Reviewed: Allergy & Precautions, H&P , NPO status , Patient's Chart, lab work & pertinent test results, reviewed documented beta blocker date and time   Airway Mallampati: II  TM Distance: >3 FB Neck ROM: full    Dental no notable dental hx.    Pulmonary asthma , Current Smoker   Pulmonary exam normal breath sounds clear to auscultation       Cardiovascular Exercise Tolerance: Good hypertension,  Rhythm:regular Rate:Normal     Neuro/Psych  Headaches, Seizures -,  PSYCHIATRIC DISORDERS Anxiety Depression     Neuromuscular disease negative neurological ROS  negative psych ROS   GI/Hepatic negative GI ROS, Neg liver ROS,GERD  ,,  Endo/Other  negative endocrine ROSdiabetes    Renal/GU Renal diseasenegative Renal ROS  negative genitourinary   Musculoskeletal   Abdominal   Peds  Hematology negative hematology ROS (+) Blood dyscrasia, anemia   Anesthesia Other Findings   Reproductive/Obstetrics negative OB ROS                              Anesthesia Physical Anesthesia Plan  ASA: 3 and emergent  Anesthesia Plan: General   Post-op Pain Management:    Induction:   PONV Risk Score and Plan: Propofol  infusion  Airway Management Planned:   Additional Equipment:   Intra-op Plan:   Post-operative Plan:   Informed Consent: I have reviewed the patients History and Physical, chart, labs and discussed the procedure including the risks, benefits and alternatives for the proposed anesthesia with the patient or authorized representative who has indicated his/her understanding and acceptance.     Dental Advisory Given  Plan Discussed with: CRNA  Anesthesia Plan Comments:          Anesthesia Quick Evaluation

## 2023-12-08 NOTE — Plan of Care (Signed)
   Problem: Education: Goal: Knowledge of General Education information will improve Description: Including pain rating scale, medication(s)/side effects and non-pharmacologic comfort measures Outcome: Progressing   Problem: Activity: Goal: Risk for activity intolerance will decrease Outcome: Progressing   Problem: Nutrition: Goal: Adequate nutrition will be maintained Outcome: Progressing

## 2023-12-08 NOTE — Consult Note (Signed)
 Gastroenterology Consult   Referring Provider: No ref. provider found Primary Care Physician:  Gammon, Chrystal, NP Primary Gastroenterologist:  not established   Patient ID: [name]; [id number]; 11-09-1975   Admit date: 12/07/2023  LOS: 0 days   Date of Consultation: 12/08/2023  Reason for Consultation:  melena  History of Present Illness   [name] is a 48 y.o. year old female with history of ESRD on dialysis MWF, anemia, depression, DVT, HTN, lupus, who presented to the ED with progressive weakness and dark stools/diarrhea. Hgb 7 on arrival, FOBT positive. GI consulted for further evaluation  ED Course: Hgb 7 FOBT + K+2.7, sodium 138 BUn 17, creat 4, calcium 8.7  AST 97 ALT 56 COVID + T max 99.8, BP 142/64 HR 97 100% on RA  Consult: Patient reports dark stools for about 1 week without abdominal pain, nausea, vomiting. Denies BRBPR. She reports looser stools. Denies any new medications recently. Denies SOB or dizziness. She endorses generalized weakness. Denies any known history of GI bleed. Appetite is good. Denies any NSAID use. Last dose of plavix  was yesterday morning.   No etoh Smokes 1 PPD  Last EGD: never Last Colonoscopy: never    Past Medical History:  Diagnosis Date   Anemia    BV (bacterial vaginosis) 11/08/2013   Depression    Dialysis patient    DVT (deep venous thrombosis) (HCC)    Hypertension    Lupus    Renal disorder    Vaginal odor 11/08/2013    Past Surgical History:  Procedure Laterality Date   A/V SHUNT INTERVENTION N/A 10/24/2016   Procedure: A/V SHUNT INTERVENTION;  Surgeon: Jama Cordella MATSU, MD;  Location: [facility];  Service: Cardiovascular;  Laterality: N/A;   A/V SHUNT INTERVENTION Left 09/15/2023   Procedure: A/V SHUNT INTERVENTION;  Surgeon: Lanis Fonda BRAVO, MD;  Location: [facility];  Service: Cardiovascular;  Laterality: Left;   A/V SHUNTOGRAM Left 12/08/2017   Procedure: A/V SHUNTOGRAM;  Surgeon:  Jama Cordella MATSU, MD;  Location: [facility];  Service: Cardiovascular;  Laterality: Left;   A/V SHUNTOGRAM Left 01/28/2018   Procedure: A/V SHUNTOGRAM;  Surgeon: [name], MD;  Location: [facility];  Service: Cardiovascular;  Laterality: Left;   A/V SHUNTOGRAM Left 04/01/2018   Procedure: A/V SHUNTOGRAM;  Surgeon: [name], MD;  Location: [facility];  Service: Cardiovascular;  Laterality: Left;   A/V SHUNTOGRAM Left 04/28/2019   Procedure: A/V SHUNTOGRAM;  Surgeon: [name], MD;  Location: [facility];  Service: Cardiovascular;  Laterality: Left;   AV FISTULA PLACEMENT     AV FISTULA PLACEMENT Left 02/09/2022   Procedure: FISTULAGRAM, placement of left arm stent.;  Surgeon: [name], MD;  Location: [facility];  Service: Vascular;  Laterality: Left;   AV FISTULA PLACEMENT Right 09/02/2022   Procedure: EXPLORATION OF RIGHT AXILLA, INADEQUATE VEIN ACCESS;  Surgeon: Oris Krystal FALCON, MD;  Location: [facility];  Service: Vascular;  Laterality: Right;   AV FISTULA PLACEMENT Left 09/25/2022   Procedure: INSERTION OF LEFT FEMORAL LOOP GRAFT USING 4-4mm GORETEX STRETCH GRAFT;  Surgeon: [name], MD;  Location: [facility];  Service: Vascular;  Laterality: Left;   DIALYSIS/PERMA CATHETER INSERTION  10/23/2016   Procedure: DIALYSIS/PERMA CATHETER INSERTION;  Surgeon: [name], MD;  Location: [facility];  Service: Cardiovascular;;   IR AV DIALY SHUNT INTRO NEEDLE/INTRACATH INITIAL W/PTA/IMG LEFT  05/12/2023   IR REMOVAL  TUN CV CATH W/O FL  09/22/2017   IR REMOVAL TUN CV CATH W/O FL  01/06/2023   IR THROMBECTOMY AV FISTULA W/THROMBOLYSIS INC/SHUNT/IMG LEFT Left 09/04/2017   IR US  GUIDE VASC ACCESS LEFT  09/04/2017   IR US  GUIDE VASC ACCESS LEFT  05/12/2023   MASS EXCISION Left 06/01/2018   Procedure: EXCISION 5 X 3CM LIPOMA LEFT THIGH;  Surgeon: [name], MD;  Location: [facility];  Service: General;  Laterality: Left;   PERIPHERAL VASCULAR  CATHETERIZATION Left 01/04/2015   Procedure: A/V Shuntogram/Fistulagram;  Surgeon: Selinda GORMAN Gu, MD;  Location: [facility];  Service: Cardiovascular;  Laterality: Left;   PERIPHERAL VASCULAR CATHETERIZATION N/A 01/04/2015   Procedure: A/V Shunt Intervention;  Surgeon: Selinda GORMAN Gu, MD;  Location: [facility];  Service: Cardiovascular;  Laterality: N/A;   PERIPHERAL VASCULAR CATHETERIZATION Left 04/13/2015   Procedure: A/V Shuntogram/Fistulagram;  Surgeon: [name], MD;  Location: [facility];  Service: Cardiovascular;  Laterality: Left;   PERIPHERAL VASCULAR CATHETERIZATION Left 04/13/2015   Procedure: A/V Shunt Intervention;  Surgeon: [name], MD;  Location: [facility];  Service: Cardiovascular;  Laterality: Left;   REMOVAL OF A DIALYSIS CATHETER Left 09/25/2022   Procedure: REMOVAL OF LEFT INTERNAL JUGULAR HERO CATHETER;  Surgeon: [name], MD;  Location: [facility];  Service: Vascular;  Laterality: Left;   UPPER EXTREMITY VENOGRAPHY N/A 08/26/2022   Procedure: UPPER EXTREMITY VENOGRAPHY;  Surgeon: [name], MD;  Location: [facility];  Service: Cardiovascular;  Laterality: N/A;   VENOUS ANGIOPLASTY  09/15/2023   Procedure: VENOUS ANGIOPLASTY;  Surgeon: Lanis Fonda BRAVO, MD;  Location: [facility];  Service: Cardiovascular;;  leg loop graft    Prior to Admission medications   Medication Sig Start Date End Date Taking? Authorizing Provider  albuterol  (PROVENTIL ) (2.5 MG/3ML) 0.083% nebulizer solution Take 2.5 mg by nebulization 4 (four) times daily as needed for wheezing or shortness of breath.    Yes [provider]  ALPRAZolam  (XANAX ) 0.25 MG tablet Take 0.25 mg by mouth 2 (two) times daily.   Yes [provider]  aspirin EC 81 MG tablet Take 81 mg by mouth daily.   Yes [provider]  Calcium Carbonate Antacid (TUMS PO) Take 1,000 mg by mouth 2 (two) times daily between meals. 06/13/22  Yes [provider]  clopidogrel  (PLAVIX ) 75 MG tablet Take 75 mg by mouth daily.   Yes [provider]  docusate sodium  (COLACE) 100 MG capsule Take 100 mg by mouth daily.   Yes [provider]  epoetin  alfa (EPOGEN ,PROCRIT) 4000 UNIT/ML injection Inject 4,000 Units into the vein See admin instructions. Mon, Wed, and Friday at dialysis   Yes [provider]  guaiFENesin  (ROBITUSSIN) 100 MG/5ML liquid Take 200 mg by mouth every 6 (six) hours as needed for cough.    Yes [provider]  ipratropium (ATROVENT ) 0.02 % nebulizer solution Take 0.5 mg by nebulization 4 (four) times daily as needed for wheezing or shortness of breath (For Breathing).   Yes [provider]  lidocaine -prilocaine  (EMLA ) cream Apply 1 application topically as needed (port access).  12/20/12  Yes [provider]  loratadine (CLARITIN) 10 MG tablet Take 10 mg by mouth daily as needed for allergies.   Yes [provider]  midodrine  (PROAMATINE ) 10 MG tablet Take 10 mg by mouth 2 (two) times daily.    Yes [provider]  mirtazapine  (REMERON )  15 MG tablet Take 7.5 mg by mouth at bedtime.  11/03/13  Yes [provider]  multivitamin (RENA-VIT) TABS tablet Take 1 tablet by mouth daily.   Yes [provider]  pantoprazole  (PROTONIX ) 40 MG tablet Take 40 mg by mouth daily.   Yes [provider]  phenytoin  (DILANTIN ) 100 MG ER capsule Take 100 mg by mouth 2 (two) times daily.    Yes [provider]  sertraline  (ZOLOFT ) 100 MG tablet Take 200 mg by mouth daily.    Yes [provider]  simvastatin  (ZOCOR ) 40 MG tablet Take 40 mg by mouth at bedtime.   Yes [provider]  ammonium lactate  (AMLACTIN DAILY) 12 % lotion Apply 1 Application topically as needed for dry skin. 04/23/23   [name], DPM  chlorhexidine  (PERIDEX ) 0.12 % solution Use as directed 15 mLs in the mouth or throat 2 (two) times daily. After regular  toothbrushing, rinse mouth 08/20/22   [provider]  cycloSPORINE (RESTASIS) 0.05 % ophthalmic emulsion Place 1 drop into both eyes 2 (two) times daily. 01/31/21   [provider]  hydroxychloroquine  (PLAQUENIL ) 200 MG tablet Take 200 mg by mouth daily.    [provider]    Current Facility-Administered Medications  Medication Dose Route Frequency Provider Last Rate Last Admin   acetaminophen  (TYLENOL ) tablet 650 mg  650 mg Oral Q6H PRN Emokpae, Ejiroghene E, MD       Or   acetaminophen  (TYLENOL ) suppository 650 mg  650 mg Rectal Q6H PRN Emokpae, Ejiroghene E, MD       [START ON 12/09/2023] calcitRIOL  (ROCALTROL ) capsule 1.75 mcg  1.75 mcg Oral Q M,W,F-HD Norine Manuelita LABOR, MD       Chlorhexidine  Gluconate Cloth 2 % PADS 6 each  6 each Topical Q0600 Norine Manuelita LABOR, MD       midodrine  (PROAMATINE ) tablet 10 mg  10 mg Oral BID WC Emokpae, Ejiroghene E, MD   10 mg at 12/08/23 9166   multivitamin (RENA-VIT) tablet 1 tablet  1 tablet Oral Daily Emokpae, Ejiroghene E, MD   1 tablet at 12/08/23 9166   ondansetron  (ZOFRAN ) tablet 4 mg  4 mg Oral Q6H PRN Emokpae, Ejiroghene E, MD       Or   ondansetron  (ZOFRAN ) injection 4 mg  4 mg Intravenous Q6H PRN Emokpae, Ejiroghene E, MD       pantoprazole  (PROTONIX ) injection 40 mg  40 mg Intravenous Q12H Emokpae, Ejiroghene E, MD   40 mg at 12/08/23 9170   phenytoin  (DILANTIN ) ER capsule 100 mg  100 mg Oral BID Emokpae, Ejiroghene E, MD   100 mg at 12/08/23 0645   polyethylene glycol (MIRALAX  / GLYCOLAX ) packet 17 g  17 g Oral Daily PRN Emokpae, Ejiroghene E, MD       potassium chloride  SA (KLOR-CON  M) CR tablet 20 mEq  20 mEq Oral BID Norine Manuelita LABOR, MD   20 mEq at 12/08/23 9162   sertraline  (ZOLOFT ) tablet 200 mg  200 mg Oral Daily Emokpae, Ejiroghene E, MD   200 mg at 12/08/23 9165   simvastatin  (ZOCOR ) tablet 40 mg  40 mg Oral QHS Emokpae, Ejiroghene E, MD        Allergies as of 12/07/2023 - Review Complete 12/07/2023   Allergen Reaction Noted   Dust mite extract Other (See Comments) 11/08/2013    Family History  Problem Relation Age of Onset   Seizures Son    Kidney disease Mother  on dialysis   Lupus Mother     Social History   Socioeconomic History   Marital status: Single    Spouse name: Not on file   Number of children: Not on file   Years of education: Not on file   Highest education level: Not on file  Occupational History   Not on file  Tobacco Use   Smoking status: Every Day    Current packs/day: 0.10    Average packs/day: 0.1 packs/day for 15.0 years (1.5 ttl pk-yrs)    Types: Cigarettes    Passive exposure: Current   Smokeless tobacco: Never  Vaping Use   Vaping status: Never Used  Substance and Sexual Activity   Alcohol  use: No   Drug use: No   Sexual activity: Not Currently    Birth control/protection: None, Abstinence  Other Topics Concern   Not on file  Social History Narrative   Lives at [facility]   Social Drivers of Health   Financial Resource Strain: Low Risk  (07/01/2022)   Overall Financial Resource Strain (CARDIA)    Difficulty of Paying Living Expenses: Not hard at all  Food Insecurity: No Food Insecurity (12/08/2023)   Hunger Vital Sign    Worried About Running Out of Food in the Last Year: Never true    Ran Out of Food in the Last Year: Never true  Transportation Needs: No Transportation Needs (12/08/2023)   PRAPARE - Administrator, Civil Service (Medical): No    Lack of Transportation (Non-Medical): No  Physical Activity: Inactive (07/01/2022)   Exercise Vital Sign    Days of Exercise per Week: 0 days    Minutes of Exercise per Session: 0 min  Stress: No Stress Concern Present (07/01/2022)   [employer] of Occupational Health - Occupational Stress Questionnaire    Feeling of Stress : Not at all  Social Connections: Socially Isolated (07/01/2022)   Social Connection and Isolation Panel    Frequency of  Communication with Friends and Family: Once a week    Frequency of Social Gatherings with Friends and Family: Once a week    Attends Religious Services: 1 to 4 times per year    Active Member of [employer] or Organizations: No    Attends Banker Meetings: Never    Marital Status: Never married  Intimate Partner Violence: Not At Risk (12/08/2023)   Humiliation, Afraid, Rape, and Kick questionnaire    Fear of Current or Ex-Partner: No    Emotionally Abused: No    Physically Abused: No    Sexually Abused: No     Review of Systems   Gen: Denies any fever, chills, loss of appetite, change in weight or weight loss CV: Denies chest pain, heart palpitations, syncope, edema  Resp: Denies shortness of breath with rest, cough, wheezing, coughing up blood, and pleurisy. GI: +melena denies hematochezia, nausea, vomiting, diarrhea, constipation, dysphagia, odyonophagia, early satiety or weight loss.  GU : Denies urinary burning, blood in urine, urinary frequency, and urinary incontinence. MS: Denies joint pain, limitation of movement, swelling, cramps, and atrophy.  Derm: Denies rash, itching, dry skin, hives. Psych: Denies depression, anxiety, memory loss, hallucinations, and confusion. Heme: Denies bruising or bleeding Neuro:  Denies any headaches, dizziness, paresthesias, shaking  Physical Exam   Vital Signs in last 24 hours: Temp:  [98.9 F (37.2 C)-99.8 F (37.7 C)] 99.2 F (37.3 C) (09/23 0800) Pulse Rate:  [90-98] 97 (09/23 0800) Resp:  [14-22] 18 (09/23 0800)  BP: (115-142)/(63-87) 138/81 (09/23 0800) SpO2:  [92 %-100 %] 92 % (09/23 0643) FiO2 (%):  [21 %] 21 % (09/23 0111) Weight:  [58 kg] 58 kg (09/22 1656)    General:   Alert,  Well-developed, well-nourished, pleasant and cooperative in NAD Head:  Normocephalic and atraumatic. Eyes:  Sclera clear, no icterus.   Conjunctiva pink. Ears:  Normal auditory acuity. Mouth:  No deformity or lesions, dentition normal. Neck:   Supple; no masses Lungs:  Clear throughout to auscultation.   No wheezes, crackles, or rhonchi. No acute distress. Heart:  Regular rate and rhythm; no murmurs, clicks, rubs,  or gallops. Abdomen:  Soft, nontender and nondistended. No masses, hepatosplenomegaly or hernias noted. Normal bowel sounds, without guarding, and without rebound.   Msk:  Symmetrical without gross deformities. Normal posture. Extremities:  Without clubbing or edema. Neurologic:  Alert and  oriented x4. Skin:  Intact without significant lesions or rashes. Psych:  Alert and cooperative. Normal mood and affect.   Labs/Studies   Recent Labs Recent Labs    12/07/23 1722 12/08/23 0001 12/08/23 0536  WBC 4.7  --  6.9  HGB 7.0* 8.0* 8.3*  HCT 20.5* 23.3* 23.9*  PLT 189  --  175   BMET Recent Labs    12/07/23 1722 12/08/23 0536  NA 138 137  K 2.7* 2.9*  CL 96* 100  CO2 27 26  GLUCOSE 96 90  BUN 17 20  CREATININE 4.00* 5.03*  CALCIUM 8.7* 8.4*   LFT Recent Labs    12/07/23 1722 12/08/23 0536  PROT 6.7 6.2*  ALBUMIN  3.5 3.1*  AST 97* 114*  ALT 56* 65*  ALKPHOS 58 48  BILITOT 0.5 0.6    Assessment   [name] is a 48 y.o. year old female with history of ESRD on dialysis MWF, anemia, depression, DVT, HTN, lupus, who presented to the ED with progressive weakness and dark stools/diarrhea. Hgb 7 on arrival, FOBT positive. GI consulted for further evaluation  Anemia/melena/heme positive stool:  -melena x1 week -no other associated GI symptoms -denies NSAID use -hgb 7 on arrival, up to 8.3, stool heme positive -denies any history of GI bleed in the past -BUN notably is WNL  Recommend proceeding with EGD for further evaluation as I cannot rule out PUD, gastritis, duodenitis, AVMs, malignancy. Indications, risks and benefits of procedure discussed in detail with patient. Patient verbalized understanding and is in agreement to proceed with EGD   Plan / Recommendations   PIP BID Trend  h&h,transfuse for hgb <7 Monitor for overt GI bleeding Hold plavix  Remain NPO EGD later today   12/08/2023, 8:53 AM  Asuncion Tapscott L. Leisha Trinkle, MSN, APRN, AGNP-C Adult-Gerontology Nurse Practitioner [facility] at [facility]

## 2023-12-08 NOTE — Transfer of Care (Deleted)
 Immediate Anesthesia Transfer of Care Note  Patient: [name]  Procedure(s) Performed: EGD (ESOPHAGOGASTRODUODENOSCOPY)  Patient Location: Short Stay  Anesthesia Type:General  Level of Consciousness: awake  Airway & Oxygen Therapy: Patient Spontanous Breathing  Post-op Assessment: Report given to RN  Post vital signs: Reviewed and stable  Last Vitals:  Vitals Value Taken Time  BP    Temp    Pulse    Resp    SpO2      Last Pain:  Vitals:   12/08/23 1401  TempSrc:   PainSc: 0-No pain         Complications: No notable events documented.

## 2023-12-08 NOTE — Progress Notes (Signed)
   12/08/23 0307  Vitals  Temp 99.8 F (37.7 C)  Temp Source Oral  BP (!) 142/72  MAP (mmHg) 93  BP Location Right Arm  BP Method Automatic  Patient Position (if appropriate) Lying  Pulse Rate 98  Pulse Rate Source Dinamap  Resp 18  Level of Consciousness  Level of Consciousness Alert  MEWS COLOR  MEWS Score Color Green  Oxygen Therapy  SpO2 93 %  O2 Device Room Air  Pain Assessment  Pain Scale 0-10  Pain Score 0  MEWS Score  MEWS Temp 0  MEWS Systolic 0  MEWS Pulse 0  MEWS RR 0  MEWS LOC 0  MEWS Score 0   Pt admitted to room 334, alert and oriented. Oriented to room, unit, staff and call light system. Pt educated on fall risk protocol and instructed to call for assist when needed. Pt is currently NPO for GI consult in am. No concerns verbalized at this time.

## 2023-12-08 NOTE — Consult Note (Addendum)
 [facility]  INPATIENT CONSULTATION  Reason for Consultation: ESRD co management Requesting Provider: Dr Cheryle  HPI: [name] is an 48 y.o. female with ESRD on HD, HTN, SLE, DVT, seizure, mod intellectual disabilities, depression currently admitted for GIB and nephrology is consulted for co management of ESRD and associated conditions.   Admitted to [facility] yesterday PM after presenting with 2d h/o black stools.  Her Hb was 7, last at dialysis 9/7 was 8.7.  Plt 189.  She was transfused 1u pRBC with Hb 8 > 8.3 this AM.  GI consult pending.   She had a full HD yesterday.  BUN 17 on admission.  K has been in the 2.7 > 2.9 range during admission. Mild transaminitis. Tm 99.8, BP 110-140s, HR 90s.   This AM she offers little history.  Denies any dyspnea but does have a dry cough.  RN reports no stools overnight.  She is COVID +.  In speaking with outpt dialysis she had a + COVID test from 9/19 done due to symptoms.   PMH: Past Medical History:  Diagnosis Date   Anemia    BV (bacterial vaginosis) 11/08/2013   Depression    Dialysis patient    DVT (deep venous thrombosis) (HCC)    Hypertension    Lupus    Renal disorder    Vaginal odor 11/08/2013   PSH: Past Surgical History:  Procedure Laterality Date   A/V SHUNT INTERVENTION N/A 10/24/2016   Procedure: A/V SHUNT INTERVENTION;  Surgeon: Jama Cordella MATSU, MD;  Location: [facility];  Service: Cardiovascular;  Laterality: N/A;   A/V SHUNT INTERVENTION Left 09/15/2023   Procedure: A/V SHUNT INTERVENTION;  Surgeon: Lanis Fonda BRAVO, MD;  Location: [facility];  Service: Cardiovascular;  Laterality: Left;   A/V SHUNTOGRAM Left 12/08/2017   Procedure: A/V SHUNTOGRAM;  Surgeon: Jama Cordella MATSU, MD;  Location: [facility];  Service: Cardiovascular;  Laterality: Left;   A/V SHUNTOGRAM Left 01/28/2018   Procedure: A/V SHUNTOGRAM;  Surgeon: [name], MD;  Location: [facility];  Service:  Cardiovascular;  Laterality: Left;   A/V SHUNTOGRAM Left 04/01/2018   Procedure: A/V SHUNTOGRAM;  Surgeon: [name], MD;  Location: [facility];  Service: Cardiovascular;  Laterality: Left;   A/V SHUNTOGRAM Left 04/28/2019   Procedure: A/V SHUNTOGRAM;  Surgeon: [name], MD;  Location: [facility];  Service: Cardiovascular;  Laterality: Left;   AV FISTULA PLACEMENT     AV FISTULA PLACEMENT Left 02/09/2022   Procedure: FISTULAGRAM, placement of left arm stent.;  Surgeon: [name], MD;  Location: [facility];  Service: Vascular;  Laterality: Left;   AV FISTULA PLACEMENT Right 09/02/2022   Procedure: EXPLORATION OF RIGHT AXILLA, INADEQUATE VEIN ACCESS;  Surgeon: Oris Krystal FALCON, MD;  Location: [facility];  Service: Vascular;  Laterality: Right;   AV FISTULA PLACEMENT Left 09/25/2022   Procedure: INSERTION OF LEFT FEMORAL LOOP GRAFT USING 4-23mm GORETEX STRETCH GRAFT;  Surgeon: [name], MD;  Location[facility];  Service: Vascular;  Laterality: Left;   DIALYSIS/PERMA CATHETER INSERTION  10/23/2016   Procedure: DIALYSIS/PERMA CATHETER INSERTION;  Surgeon: [name], MD;  Location: [facility];  Service: Cardiovascular;;   IR AV DIALY SHUNT INTRO NEEDLE/INTRACATH INITIAL W/PTA/IMG LEFT  05/12/2023   IR REMOVAL TUN CV CATH W/O FL  09/22/2017   IR REMOVAL TUN CV CATH W/O FL  01/06/2023   IR THROMBECTOMY AV FISTULA W/THROMBOLYSIS  INC/SHUNT/IMG LEFT Left 09/04/2017   IR US  GUIDE VASC ACCESS LEFT  09/04/2017   IR US  GUIDE VASC ACCESS LEFT  05/12/2023   MASS EXCISION Left 06/01/2018   Procedure: EXCISION 5 X 3CM LIPOMA LEFT THIGH;  Surgeon: [name], MD;  Location: [facility];  Service: General;  Laterality: Left;   PERIPHERAL VASCULAR CATHETERIZATION Left 01/04/2015   Procedure: A/V Shuntogram/Fistulagram;  Surgeon: Selinda GORMAN Gu, MD;  Location: [facility];  Service: Cardiovascular;  Laterality: Left;   PERIPHERAL VASCULAR CATHETERIZATION N/A 01/04/2015   Procedure:  A/V Shunt Intervention;  Surgeon: Selinda GORMAN Gu, MD;  Location: [facility];  Service: Cardiovascular;  Laterality: N/A;   PERIPHERAL VASCULAR CATHETERIZATION Left 04/13/2015   Procedure: A/V Shuntogram/Fistulagram;  Surgeon: [name], MD;  Location: [facility];  Service: Cardiovascular;  Laterality: Left;   PERIPHERAL VASCULAR CATHETERIZATION Left 04/13/2015   Procedure: A/V Shunt Intervention;  Surgeon: [name], MD;  Location: [facility];  Service: Cardiovascular;  Laterality: Left;   REMOVAL OF A DIALYSIS CATHETER Left 09/25/2022   Procedure: REMOVAL OF LEFT INTERNAL JUGULAR HERO CATHETER;  Surgeon: [name], MD;  Location[facility];  Service: Vascular;  Laterality: Left;   UPPER EXTREMITY VENOGRAPHY N/A 08/26/2022   Procedure: UPPER EXTREMITY VENOGRAPHY;  Surgeon: [name], MD;  [facility]: [facility];  Service: Cardiovascular;  Laterality: N/A;   VENOUS ANGIOPLASTY  09/15/2023   Procedure: VENOUS ANGIOPLASTY;  Surgeon: Lanis Fonda BRAVO, MD;  Location: [facility];  Service: Cardiovascular;;  leg loop graft    Past Medical History:  Diagnosis Date   Anemia    BV (bacterial vaginosis) 11/08/2013   Depression    Dialysis patient    DVT (deep venous thrombosis) (HCC)    Hypertension    Lupus    Renal disorder    Vaginal odor 11/08/2013    Medications:  I have reviewed the patient's current medications.  Facility-Administered Medications Prior to Admission  Medication Dose Route Frequency Provider Last Rate Last Admin   0.9 %  sodium chloride  infusion  250 mL Intravenous PRN Brabham, Vance W, MD       sodium chloride  flush (NS) 0.9 % injection 3 mL  3 mL Intravenous Q12H [name], MD       Medications Prior to Admission  Medication Sig Dispense Refill   albuterol  (PROVENTIL ) (2.5 MG/3ML) 0.083% nebulizer solution Take 2.5 mg by nebulization 4 (four) times daily as needed for wheezing or shortness of breath.       ALPRAZolam  (XANAX ) 0.25 MG tablet Take 0.25 mg by mouth 2 (two) times daily.     aspirin EC 81 MG tablet Take 81 mg by mouth daily.     Calcium Carbonate Antacid (TUMS PO) Take 1,000 mg by mouth 2 (two) times daily between meals.     clopidogrel  (PLAVIX ) 75 MG tablet Take 75 mg by mouth daily.     docusate sodium  (COLACE) 100 MG capsule Take 100 mg by mouth daily.     epoetin  alfa (EPOGEN ,PROCRIT) 4000 UNIT/ML injection Inject 4,000 Units into the vein See admin instructions. Mon, Wed, and Friday at dialysis     guaiFENesin  (ROBITUSSIN) 100 MG/5ML liquid Take 200 mg by mouth every 6 (six) hours as needed for cough.      ipratropium (ATROVENT ) 0.02 % nebulizer solution Take 0.5 mg by nebulization 4 (four) times daily as needed for wheezing or shortness of breath (For Breathing).  lidocaine -prilocaine  (EMLA ) cream Apply 1 application topically as needed (port access).      loratadine (CLARITIN) 10 MG tablet Take 10 mg by mouth daily as needed for allergies.     midodrine  (PROAMATINE ) 10 MG tablet Take 10 mg by mouth 2 (two) times daily.      mirtazapine  (REMERON ) 15 MG tablet Take 7.5 mg by mouth at bedtime.      multivitamin (RENA-VIT) TABS tablet Take 1 tablet by mouth daily.     pantoprazole  (PROTONIX ) 40 MG tablet Take 40 mg by mouth daily.     phenytoin  (DILANTIN ) 100 MG ER capsule Take 100 mg by mouth 2 (two) times daily.      sertraline  (ZOLOFT ) 100 MG tablet Take 200 mg by mouth daily.      simvastatin  (ZOCOR ) 40 MG tablet Take 40 mg by mouth at bedtime.     ammonium lactate  (AMLACTIN DAILY) 12 % lotion Apply 1 Application topically as needed for dry skin. 400 g 0   chlorhexidine  (PERIDEX ) 0.12 % solution Use as directed 15 mLs in the mouth or throat 2 (two) times daily. After regular toothbrushing, rinse mouth     cycloSPORINE (RESTASIS) 0.05 % ophthalmic emulsion Place 1 drop into both eyes 2 (two) times daily.     hydroxychloroquine  (PLAQUENIL ) 200 MG tablet Take 200 mg by mouth  daily.      ALLERGIES:   Allergies  Allergen Reactions   Dust Mite Extract Other (See Comments)    sneezing    FAM HX: Family History  Problem Relation Age of Onset   Seizures Son    Kidney disease Mother        on dialysis   Lupus Mother     Social History:   reports that she has been smoking cigarettes. She has a 1.5 pack-year smoking history. She has been exposed to tobacco smoke. She has never used smokeless tobacco. She reports that she does not drink alcohol  and does not use drugs.  ROS: 12 system ROS neg except per HPI  Blood pressure 123/68, pulse 91, temperature 99 F (37.2 C), temperature source Oral, resp. rate 14, height 5' 3 (1.6 m), weight 58 kg, last menstrual period 10/28/2023, SpO2 92%. PHYSICAL EXAM: Gen: nontoxic appearing in bed  Eyes: EOMI ENT: NNN Neck: supple CV: RRR Abd: soft Lungs: clear, frequent dry cough, normal WOB at rest Extr: no edema, L thigh AVG +t/b removed dressing no bleeding noted Neuro: grossly nonfocal   Results for orders placed or performed during the hospital encounter of 12/07/23 (from the past 48 hours)  Comprehensive metabolic panel     Status: Abnormal   Collection Time: 12/07/23  5:22 PM  Result Value Ref Range   Sodium 138 135 - 145 mmol/L   Potassium 2.7 (LL) 3.5 - 5.1 mmol/L    Comment: CRITICAL RESULT CALLED TO, READ BACK BY AND VERIFIED WITH SAPPELT,J ON 12/07/23 AT 1837 BY LOY,C   Chloride 96 (L) 98 - 111 mmol/L   CO2 27 22 - 32 mmol/L   Glucose, Bld 96 70 - 99 mg/dL    Comment: Glucose reference range applies only to samples taken after fasting for at least 8 hours.   BUN 17 6 - 20 mg/dL   Creatinine, Ser 5.99 (H) 0.44 - 1.00 mg/dL   Calcium 8.7 (L) 8.9 - 10.3 mg/dL   Total Protein 6.7 6.5 - 8.1 g/dL   Albumin  3.5 3.5 - 5.0 g/dL   AST 97 (H) 15 - 41  U/L   ALT 56 (H) 0 - 44 U/L   Alkaline Phosphatase 58 38 - 126 U/L   Total Bilirubin 0.5 0.0 - 1.2 mg/dL   GFR, Estimated 13 (L) >60 mL/min    Comment:  (NOTE) Calculated using the CKD-EPI Creatinine Equation (2021)    Anion gap 15 5 - 15    Comment: Performed at [facility], [address], [place], [place] [address]  CBC     Status: Abnormal   Collection Time: 12/07/23  5:22 PM  Result Value Ref Range   WBC 4.7 4.0 - 10.5 K/uL   RBC 2.55 (L) 3.87 - 5.11 MIL/uL   Hemoglobin 7.0 (L) 12.0 - 15.0 g/dL   HCT 79.4 (L) 63.9 - 53.9 %   MCV 80.4 80.0 - 100.0 fL   MCH 27.5 26.0 - 34.0 pg   MCHC 34.1 30.0 - 36.0 g/dL   RDW 82.5 (H) 88.4 - 84.4 %   Platelets 189 150 - 400 K/uL   nRBC 0.0 0.0 - 0.2 %    Comment: Performed at [facility], [address], [place], [place] [address]  Type and screen [facility]     Status: None (Preliminary result)   Collection Time: 12/07/23  5:22 PM  Result Value Ref Range   ABO/RH(D) O POS    Antibody Screen NEG    Sample Expiration 12/10/2023,2359    Unit Number [id number]    Blood Component Type RBC LR PHER1    Unit division 00    Status of Unit ISSUED    Transfusion Status OK TO TRANSFUSE    Crossmatch Result      Compatible Performed at [facility], [address], [place], [place] [address]   Magnesium     Status: None   Collection Time: 12/07/23  5:22 PM  Result Value Ref Range   Magnesium 1.8 1.7 - 2.4 mg/dL    Comment: Performed at [facility], [address] [place], [place] [address]  POC occult blood, ED     Status: Abnormal   Collection Time: 12/07/23  5:55 PM  Result Value Ref Range   Positive    Prepare RBC (crossmatch)     Status: None   Collection Time: 12/07/23  6:30 PM  Result Value Ref Range   Order Confirmation      ORDER PROCESSED BY BLOOD BANK Performed at [facility], [address], [place], [place] [address]   Resp panel by RT-PCR (RSV, Flu A&B, Covid) Anterior Nasal Swab     Status: Abnormal   Collection Time: 12/07/23  8:16 PM   Specimen: Anterior Nasal Swab  Result Value Ref Range   SARS Coronavirus 2 by RT PCR POSITIVE (A) NEGATIVE    Comment:  (NOTE) SARS-CoV-2 target nucleic acids are DETECTED.  The SARS-CoV-2 RNA is generally detectable in upper respiratory specimens during the acute phase of infection. Positive results are indicative of the presence of the identified virus, but do not rule out bacterial infection or co-infection with other pathogens not detected by the test. Clinical correlation with patient history and other diagnostic information is necessary to determine patient infection status. The expected result is Negative.  Fact Sheet for Patients: BloggerCourse.com  Fact Sheet for Healthcare Providers: SeriousBroker.it  This test is not yet approved or cleared by the [place]  FDA and  has been authorized for detection and/or diagnosis of SARS-CoV-2 by FDA under an Emergency Use Authorization (EUA).  This EUA will remain in effect (meaning this  test can be used) for the duration of  the COVID-19 declaration under Section 564(b)(1) of the A ct, 21 U.S.C. section 360bbb-3(b)(1), unless the authorization is terminated or revoked sooner.     Influenza A by PCR NEGATIVE NEGATIVE   Influenza B by PCR NEGATIVE NEGATIVE    Comment: (NOTE) The Xpert Xpress SARS-CoV-2/FLU/RSV plus assay is intended as an aid in the diagnosis of influenza from Nasopharyngeal swab specimens and should not be used as a sole basis for treatment. Nasal washings and aspirates are unacceptable for Xpert Xpress SARS-CoV-2/FLU/RSV testing.  Fact Sheet for Patients: BloggerCourse.com  Fact Sheet for Healthcare Providers: SeriousBroker.it  This test is not yet approved or cleared by the [place]  FDA and has been authorized for detection and/or diagnosis of SARS-CoV-2 by FDA under an Emergency Use Authorization (EUA). This EUA will remain in effect (meaning this test can be used) for the duration of the COVID-19 declaration under  Section 564(b)(1) of the Act, 21 U.S.C. section 360bbb-3(b)(1), unless the authorization is terminated or revoked.     Resp Syncytial Virus by PCR NEGATIVE NEGATIVE    Comment: (NOTE) Fact Sheet for Patients: BloggerCourse.com  Fact Sheet for Healthcare Providers: SeriousBroker.it  This test is not yet approved or cleared by the [place]  FDA and has been authorized for detection and/or diagnosis of SARS-CoV-2 by FDA under an Emergency Use Authorization (EUA). This EUA will remain in effect (meaning this test can be used) for the duration of the COVID-19 declaration under Section 564(b)(1) of the Act, 21 U.S.C. section 360bbb-3(b)(1), unless the authorization is terminated or revoked.  Performed at [facility], [address], [place], [place] [address]   Hemoglobin and hematocrit, blood     Status: Abnormal   Collection Time: 12/08/23 12:01 AM  Result Value Ref Range   Hemoglobin 8.0 (L) 12.0 - 15.0 g/dL   HCT 76.6 (L) 63.9 - 53.9 %    Comment: Performed at [facility], [address] [place], [place] [address]  Comprehensive metabolic panel with GFR     Status: Abnormal   Collection Time: 12/08/23  5:36 AM  Result Value Ref Range   Sodium 137 135 - 145 mmol/L   Potassium 2.9 (L) 3.5 - 5.1 mmol/L   Chloride 100 98 - 111 mmol/L   CO2 26 22 - 32 mmol/L   Glucose, Bld 90 70 - 99 mg/dL    Comment: Glucose reference range applies only to samples taken after fasting for at least 8 hours.   BUN 20 6 - 20 mg/dL   Creatinine, Ser 4.96 (H) 0.44 - 1.00 mg/dL   Calcium 8.4 (L) 8.9 - 10.3 mg/dL   Total Protein 6.2 (L) 6.5 - 8.1 g/dL   Albumin  3.1 (L) 3.5 - 5.0 g/dL   AST 885 (H) 15 - 41 U/L   ALT 65 (H) 0 - 44 U/L   Alkaline Phosphatase 48 38 - 126 U/L   Total Bilirubin 0.6 0.0 - 1.2 mg/dL   GFR, Estimated 10 (L) >60 mL/min    Comment: (NOTE) Calculated using the CKD-EPI Creatinine Equation (2021)    Anion gap 11 5 - 15     Comment: Performed at [facility], [address] [place], [place] [address]  CBC     Status: Abnormal   Collection Time: 12/08/23  5:36 AM  Result Value Ref Range   WBC 6.9 4.0 - 10.5 K/uL   RBC 3.01 (L) 3.87 - 5.11 MIL/uL   Hemoglobin 8.3 (L) 12.0 -  15.0 g/dL   HCT 76.0 (L) 63.9 - 53.9 %   MCV 79.4 (L) 80.0 - 100.0 fL   MCH 27.6 26.0 - 34.0 pg   MCHC 34.7 30.0 - 36.0 g/dL   RDW 83.1 (H) 88.4 - 84.4 %   Platelets 175 150 - 400 K/uL   nRBC 0.0 0.0 - 0.2 %    Comment: Performed at [facility], [address], [place], [place] [address]    No results found. Larue Chester MWF Last tx 9/22 3:30h, EDW 53kg, AVG L thigh 15g, 17H elisio BFR 400, DFR 500, 2/2.5 dialysate RN reports commonly has oozing around needle site Venofer  50 weekly last 9/22 Mircera last dosed last week  Heparing 2500 loading + 1000 hourly Calictriol 1.75 three times per week  9/8 Hb 8.7   Assessment/PlanAnnette [name] is an 48 y.o. female with ESRD on HD, HTN, SLE, DVT, seizure, mod intellectual disabilities, depression currently admitted for GIB and nephrology is consulted for co management of ESRD and associated conditions.   **GIB: Hb 7s, s/p 1u pRBC 9/22 with improvement.  On PPI. GI consult pending.   **ESRD on HD: MWF, had HD yesterday. Will dialyze here tomorrow if she remains admitted.  She is on midodrine  outpt BID, continued here.    **COVID +:  PCR positive here also outpt 9/18, symptoms mild and on RA; per primary. CXR to be obtained.   **Anemia: ABLA + ESRD. Last outpt Hb 8.7, 7 on presentation here.  Transfused with appropriate increase, GI w/u pending.  Rec'd mircera last week, can dose here if remains admitted but not due yet.  Will hold outpt heparin  given with HD in setting of GIB.  **BMM: does not appear to be on a binder outpt, will check phos here.  Corr ca ok. Cont outpt VDRA dose.   **Hypokalemia:  will supplement today and check in AM to allow proper dialysate use.   Other  chronic issues per primary.  Reach out with concerns.   Manuelita DELENA Barters 12/08/2023, 8:07 AM

## 2023-12-08 NOTE — Transfer of Care (Signed)
 Immediate Anesthesia Transfer of Care Note  Patient: [name]  Procedure(s) Performed: EGD (ESOPHAGOGASTRODUODENOSCOPY)  Patient Location: Endoscopy Unit  Anesthesia Type:General  Level of Consciousness: awake  Airway & Oxygen Therapy: Patient Spontanous Breathing  Post-op Assessment: Report given to RN  Post vital signs: Reviewed and stable  Last Vitals:  Vitals Value Taken Time  BP    Temp    Pulse    Resp    SpO2      Last Pain:  Vitals:   12/08/23 1457  TempSrc:   PainSc: 0-No pain         Complications: No notable events documented.

## 2023-12-09 ENCOUNTER — Encounter (HOSPITAL_COMMUNITY): Payer: Self-pay | Admitting: Gastroenterology

## 2023-12-09 ENCOUNTER — Inpatient Hospital Stay (HOSPITAL_COMMUNITY)

## 2023-12-09 DIAGNOSIS — Z992 Dependence on renal dialysis: Secondary | ICD-10-CM | POA: Diagnosis not present

## 2023-12-09 DIAGNOSIS — N186 End stage renal disease: Secondary | ICD-10-CM | POA: Diagnosis not present

## 2023-12-09 DIAGNOSIS — K922 Gastrointestinal hemorrhage, unspecified: Secondary | ICD-10-CM | POA: Diagnosis not present

## 2023-12-09 DIAGNOSIS — D649 Anemia, unspecified: Secondary | ICD-10-CM | POA: Diagnosis not present

## 2023-12-09 LAB — HEPATITIS B SURFACE ANTIGEN: Hepatitis B Surface Ag: NONREACTIVE

## 2023-12-09 LAB — COMPREHENSIVE METABOLIC PANEL WITH GFR
ALT: 59 U/L — ABNORMAL HIGH (ref 0–44)
AST: 88 U/L — ABNORMAL HIGH (ref 15–41)
Albumin: 3 g/dL — ABNORMAL LOW (ref 3.5–5.0)
Alkaline Phosphatase: 50 U/L (ref 38–126)
Anion gap: 14 (ref 5–15)
BUN: 30 mg/dL — ABNORMAL HIGH (ref 6–20)
CO2: 24 mmol/L (ref 22–32)
Calcium: 8.5 mg/dL — ABNORMAL LOW (ref 8.9–10.3)
Chloride: 99 mmol/L (ref 98–111)
Creatinine, Ser: 6.83 mg/dL — ABNORMAL HIGH (ref 0.44–1.00)
GFR, Estimated: 7 mL/min — ABNORMAL LOW (ref >60)
Glucose, Bld: 89 mg/dL (ref 70–99)
Potassium: 4 mmol/L (ref 3.5–5.1)
Sodium: 137 mmol/L (ref 135–145)
Total Bilirubin: 0.7 mg/dL (ref 0.0–1.2)
Total Protein: 6 g/dL — ABNORMAL LOW (ref 6.5–8.1)

## 2023-12-09 LAB — CBC WITH DIFFERENTIAL/PLATELET
Abs Immature Granulocytes: 0.03 K/uL (ref 0.00–0.07)
Basophils Absolute: 0 K/uL (ref 0.0–0.1)
Basophils Relative: 1 %
Eosinophils Absolute: 0.1 K/uL (ref 0.0–0.5)
Eosinophils Relative: 2 %
HCT: 25.7 % — ABNORMAL LOW (ref 36.0–46.0)
Hemoglobin: 8.6 g/dL — ABNORMAL LOW (ref 12.0–15.0)
Immature Granulocytes: 1 %
Lymphocytes Relative: 16 %
Lymphs Abs: 1 K/uL (ref 0.7–4.0)
MCH: 26.6 pg (ref 26.0–34.0)
MCHC: 33.5 g/dL (ref 30.0–36.0)
MCV: 79.6 fL — ABNORMAL LOW (ref 80.0–100.0)
Monocytes Absolute: 0.4 K/uL (ref 0.1–1.0)
Monocytes Relative: 6 %
Neutro Abs: 4.5 K/uL (ref 1.7–7.7)
Neutrophils Relative %: 74 %
Platelets: 153 K/uL (ref 150–400)
RBC: 3.23 MIL/uL — ABNORMAL LOW (ref 3.87–5.11)
RDW: 16.9 % — ABNORMAL HIGH (ref 11.5–15.5)
WBC: 6 K/uL (ref 4.0–10.5)
nRBC: 0 % (ref 0.0–0.2)

## 2023-12-09 LAB — LACTATE DEHYDROGENASE: LDH: 228 U/L — ABNORMAL HIGH (ref 98–192)

## 2023-12-09 LAB — MAGNESIUM: Magnesium: 1.9 mg/dL (ref 1.7–2.4)

## 2023-12-09 LAB — FERRITIN: Ferritin: 689 ng/mL — ABNORMAL HIGH (ref 11–307)

## 2023-12-09 LAB — HEPATITIS PANEL, ACUTE
HCV Ab: NONREACTIVE
Hep A IgM: NONREACTIVE
Hep B C IgM: NONREACTIVE
Hepatitis B Surface Ag: NONREACTIVE

## 2023-12-09 LAB — C-REACTIVE PROTEIN: CRP: 11.1 mg/dL — ABNORMAL HIGH (ref <1.0)

## 2023-12-09 LAB — PHOSPHORUS: Phosphorus: 1.4 mg/dL — ABNORMAL LOW (ref 2.5–4.6)

## 2023-12-09 MED ORDER — HEPARIN SODIUM (PORCINE) 1000 UNIT/ML DIALYSIS: 1000.0000 [IU] | INTRAMUSCULAR | Status: DC | PRN

## 2023-12-09 MED ORDER — CLOPIDOGREL BISULFATE 75 MG PO TABS
75.0000 mg | ORAL_TABLET | Freq: Every day | ORAL | Status: DC
  Administered 2023-12-09 – 2023-12-10 (×2): 75 mg via ORAL
  Filled 2023-12-09 (×2): qty 1

## 2023-12-09 MED ORDER — LIDOCAINE HCL (PF) 1 % IJ SOLN: 5.0000 mL | INTRAMUSCULAR | Status: DC | PRN

## 2023-12-09 MED ORDER — NEPRO/CARBSTEADY PO LIQD
237.0000 mL | Freq: Three times a day (TID) | ORAL | Status: DC
  Administered 2023-12-10: 237 mL via ORAL

## 2023-12-09 MED ORDER — ANTICOAGULANT SODIUM CITRATE 4% (200MG/5ML) IV SOLN: 5.0000 mL | Status: DC | PRN

## 2023-12-09 MED ORDER — PENTAFLUOROPROP-TETRAFLUOROETH EX AERO: 1.0000 | INHALATION_SPRAY | CUTANEOUS | Status: DC | PRN

## 2023-12-09 MED ORDER — ALTEPLASE 2 MG IJ SOLR: 2.0000 mg | Freq: Once | INTRAMUSCULAR | Status: DC | PRN

## 2023-12-09 MED ORDER — LIDOCAINE-PRILOCAINE 2.5-2.5 % EX CREA: 1.0000 | TOPICAL_CREAM | CUTANEOUS | Status: DC | PRN

## 2023-12-09 NOTE — Progress Notes (Signed)
 PROGRESS NOTE   [name]  FMW:[id number] DOB: 1975-09-22 DOA: 12/07/2023 PCP: Gammon, Chrystal, NP   Chief Complaint  Patient presents with   Rectal Bleeding   Dark Colored Stools   Level of care: Telemetry  Brief Admission History:  48 year old female with history of  ESRD on dialysis, diabetes mellitus, DVT, hypertension, seizure, SLE, moderate intellectual disabilities presented with black stools.  On presentation, hemoglobin was 7 down from baseline of more than 12, last checked a year ago with AST of 97 8, ALT of 56.  She was transfused 1 unit packed red cells.  GI was consulted.  He was started on IV Protonix .    Assessment and Plan:  upper GI bleeding Acute blood loss anemia -On presentation, hemoglobin was 7 down from baseline of more than 12, last checked a year ago.  Unclear what her recent baseline is. -Transfused 1 unit packed red cells on admission.  Hemoglobin 8.3 today.  Continue IV Protonix .  Continue NPO.  Await GI recommendations.  Aspirin and Plavix  held initially, per GI can restart plavix  today which has been done.    Hypokalemia - Repleted    ESRD on hemodialysis - HD today per nephrology.  Consult nephrology to continue inpatient dialysis   Hypertension - Monitor blood pressure.  Not on any antihypertensives.  Looks like she is on midodrine  for intermittent hypotension which has been continued   Acute transaminitis - Questionable cause.  Slightly worsening today.  Monitor.  Check hepatitis panel in AM.  Hold statin   Hyperlipidemia - Hold statin as above   COVID-19 infection - Tested positive for COVID on 12/03/2023 as an outpatient.  CXR showing infiltrates, recheck in AM after dialysis today; remains on room air oxygen and asymptomatic.    Seizure disorder - Continue phenytoin    Diabetes mellitus type 2 -diet controlled.  Blood sugars currently stable.   DVT prophylaxis: SCDs Code Status: Full  Family Communication:   Disposition:anticipating home in next 1-2 days   Consultants:  GI Nephrology  Procedures:   Antimicrobials:    Subjective: Pt reports no evidence of recurrent GI bleeding.  Objective: Vitals:   12/09/23 1400 12/09/23 1417 12/09/23 1442 12/09/23 1500  BP: 119/69 120/67 125/69 120/67  Pulse: 82 85 85 86  Resp: 17 (!) 22 20 16   Temp:   98 F (36.7 C) 97.9 F (36.6 C)  TempSrc:   Oral Oral  SpO2: 100% 100% 100% 100%  Weight:   50.2 kg   Height:        Intake/Output Summary (Last 24 hours) at 12/09/2023 1643 Last data filed at 12/09/2023 1417 Gross per 24 hour  Intake --  Output 500 ml  Net -500 ml   Filed Weights   12/07/23 1656 12/09/23 1047 12/09/23 1442  Weight: 58 kg 50.8 kg 50.2 kg   Examination:  General exam: Appears calm and comfortable  Respiratory system: Clear to auscultation. Respiratory effort normal. Cardiovascular system: normal S1 & S2 heard. No JVD, murmurs, rubs, gallops or clicks. No pedal edema. Gastrointestinal system: Abdomen is nondistended, soft and nontender. No organomegaly or masses felt. Normal bowel sounds heard. Central nervous system: Alert and oriented. No focal neurological deficits. Extremities: Symmetric 5 x 5 power. Skin: No rashes, lesions or ulcers. Psychiatry: Judgement and insight appear normal. Mood & affect appropriate.   Data Reviewed: I have personally reviewed following labs and imaging studies  CBC: Recent Labs  Lab 12/07/23 1722 12/08/23 0001 12/08/23 0536 12/09/23 0429  WBC 4.7  --  6.9 6.0  NEUTROABS  --   --   --  4.5  HGB 7.0* 8.0* 8.3* 8.6*  HCT 20.5* 23.3* 23.9* 25.7*  MCV 80.4  --  79.4* 79.6*  PLT 189  --  175 153    Basic Metabolic Panel: Recent Labs  Lab 12/07/23 1722 12/08/23 0536 12/09/23 0429  NA 138 137 137  K 2.7* 2.9* 4.0  CL 96* 100 99  CO2 27 26 24   GLUCOSE 96 90 89  BUN 17 20 30*  CREATININE 4.00* 5.03* 6.83*  CALCIUM 8.7* 8.4* 8.5*  MG 1.8  --  1.9  PHOS  --   --  1.4*     CBG: No results for input(s): GLUCAP in the last 168 hours.  Recent Results (from the past 240 hours)  Resp panel by RT-PCR (RSV, Flu A&B, Covid) Anterior Nasal Swab     Status: Abnormal   Collection Time: 12/07/23  8:16 PM   Specimen: Anterior Nasal Swab  Result Value Ref Range Status   SARS Coronavirus 2 by RT PCR POSITIVE (A) NEGATIVE Final    Comment: (NOTE) SARS-CoV-2 target nucleic acids are DETECTED.  The SARS-CoV-2 RNA is generally detectable in upper respiratory specimens during the acute phase of infection. Positive results are indicative of the presence of the identified virus, but do not rule out bacterial infection or co-infection with other pathogens not detected by the test. Clinical correlation with patient history and other diagnostic information is necessary to determine patient infection status. The expected result is Negative.  Fact Sheet for Patients: BloggerCourse.com  Fact Sheet for Healthcare Providers: SeriousBroker.it  This test is not yet approved or cleared by the [place]  FDA and  has been authorized for detection and/or diagnosis of SARS-CoV-2 by FDA under an Emergency Use Authorization (EUA).  This EUA will remain in effect (meaning this test can be used) for the duration of  the COVID-19 declaration under Section 564(b)(1) of the A ct, 21 U.S.C. section 360bbb-3(b)(1), unless the authorization is terminated or revoked sooner.     Influenza A by PCR NEGATIVE NEGATIVE Final   Influenza B by PCR NEGATIVE NEGATIVE Final    Comment: (NOTE) The Xpert Xpress SARS-CoV-2/FLU/RSV plus assay is intended as an aid in the diagnosis of influenza from Nasopharyngeal swab specimens and should not be used as a sole basis for treatment. Nasal washings and aspirates are unacceptable for Xpert Xpress SARS-CoV-2/FLU/RSV testing.  Fact Sheet for  Patients: BloggerCourse.com  Fact Sheet for Healthcare Providers: SeriousBroker.it  This test is not yet approved or cleared by the [place]  FDA and has been authorized for detection and/or diagnosis of SARS-CoV-2 by FDA under an Emergency Use Authorization (EUA). This EUA will remain in effect (meaning this test can be used) for the duration of the COVID-19 declaration under Section 564(b)(1) of the Act, 21 U.S.C. section 360bbb-3(b)(1), unless the authorization is terminated or revoked.     Resp Syncytial Virus by PCR NEGATIVE NEGATIVE Final    Comment: (NOTE) Fact Sheet for Patients: BloggerCourse.com  Fact Sheet for Healthcare Providers: SeriousBroker.it  This test is not yet approved or cleared by the [place]  FDA and has been authorized for detection and/or diagnosis of SARS-CoV-2 by FDA under an Emergency Use Authorization (EUA). This EUA will remain in effect (meaning this test can be used) for the duration of the COVID-19 declaration under Section 564(b)(1) of the Act, 21 U.S.C. section 360bbb-3(b)(1), unless the authorization is terminated or revoked.  Performed at [facility]  [facility], [address] [place], [place] [address]      Radiology Studies: US  Abdomen Limited RUQ (LIVER/GB) Result Date: 12/09/2023 CLINICAL DATA:  Elevated LFTs EXAM: ULTRASOUND ABDOMEN LIMITED RIGHT UPPER QUADRANT COMPARISON:  None Available. FINDINGS: Gallbladder: No gallstones or wall thickening visualized. No sonographic Murphy sign noted by sonographer. Possible small gallbladder polyp measuring up to 0.5 cm versus nonshadowing gallstone along the dependent portion of the gallbladder. Gallbladder is relatively decompressed. Common bile duct: Diameter: 2.0 mm. Liver: No focal lesion identified. Within normal limits in parenchymal echogenicity. Portal vein is patent on color Doppler imaging  with normal direction of blood flow towards the liver. Other: None. IMPRESSION: Tiny 0.5 cm gallbladder polyp versus nonshadowing gallstone. No follow-up is indicated if patient has no prior history of malignancy. Electronically Signed   By: Michaeline Blanch M.D.   On: 12/09/2023 15:07   DG CHEST PORT 1 VIEW Result Date: 12/08/2023 CLINICAL DATA:  COVID-19 infection fall EXAM: PORTABLE CHEST 1 VIEW COMPARISON:  January 23, 2022 FINDINGS: Patchy opacity at the right lung base with small to moderate right pleural effusion. Left mid lung and lower lung patchy opacities as well as right midlung opacity. No pneumothorax. Unchanged cardiomediastinal silhouette. Vascular calcification. Left upper extremity vascular stent. IMPRESSION: Consolidation at the right lung base and additional multifocal opacities bilaterally likely representing multifocal infection. Small to moderate right pleural effusion. Electronically Signed   By: Michaeline Blanch M.D.   On: 12/08/2023 13:49    Scheduled Meds:  calcitRIOL   1.75 mcg Oral Q M,W,F-HD   Chlorhexidine  Gluconate Cloth  6 each Topical Q0600   clopidogrel   75 mg Oral Daily   feeding supplement (NEPRO CARB STEADY)  237 mL Oral TID BM   midodrine   10 mg Oral BID WC   multivitamin  1 tablet Oral Daily   pantoprazole  (PROTONIX ) IV  40 mg Intravenous Q12H   phenytoin   100 mg Oral BID   sertraline   200 mg Oral Daily   Continuous Infusions:   LOS: 1 day   Time spent: 57 mins  Miklos Bidinger Vicci, MD How to contact the [facility] Attending or Consulting provider 7A - 7P or covering provider during after hours 7P -7A, for this patient?  Check the care team in [facility] and look for a) attending/consulting [facility] provider listed and b) the [facility] team listed Log into [email or web address] to find provider on call.  Locate the [facility] provider you are looking for under [facility] and page to a number that you can be directly reached. If you still have difficulty reaching the provider, please page the  [facility] (Director on Call) for the Hospitalists listed on amion for assistance.  12/09/2023, 4:43 PM

## 2023-12-09 NOTE — Plan of Care (Signed)

## 2023-12-09 NOTE — Hospital Course (Signed)
 48 year old female with history of  ESRD on dialysis, diabetes mellitus, DVT, hypertension, seizure, SLE, moderate intellectual disabilities presented with black stools.  On presentation, hemoglobin was 7 down from baseline of more than 12, last checked a year ago with AST of 97 8, ALT of 56.  She was transfused 1 unit packed red cells.  GI was consulted.  He was started on IV Protonix .

## 2023-12-09 NOTE — Procedures (Signed)
 Received patient in bed to unit.  Alert and oriented.  Informed consent signed and in chart.  L thigh graft cannulated x 2 with 15g needles per policy without difficulty. Tx initiated per MD order. Dr Norine notify of pt wt, UF goal of 500 ml as tolerated , tx time decreased to 3 hours.  TX duration:3 hours Tx complete. Blood returned. Needles removed, sites held x 2 until hemostasis achieved, gauze changed prior to taping.  Patient tolerated well.  Transported back to the room  Alert, without acute distress.  Hand-off given to patient's nurse.   Access used: L thigh AVG Access issues: none  Total UF removed: 500 ml Medication(s) given: See MAR   [facility]

## 2023-12-09 NOTE — Progress Notes (Signed)
 Subjective: Feeling okay this afternoon after dialysis. No abdominal pain, nausea or vomiting. No BMs today.   Objective: Vital signs in last 24 hours: Temp:  [98.4 F (36.9 C)-99 F (37.2 C)] 98.9 F (37.2 C) (09/24 0438) Pulse Rate:  [80-94] 94 (09/24 0438) Resp:  [18-22] 18 (09/24 0438) BP: (108-139)/(61-77) 139/70 (09/24 0438) SpO2:  [90 %-100 %] 91 % (09/24 0438)   General:   Alert and oriented, pleasant Head:  Normocephalic and atraumatic. Eyes:  No icterus, sclera clear. Conjuctiva pink.  Mouth:  Without lesions, mucosa pink and moist.   Heart:  S1, S2 present, no murmurs noted.  Lungs: Clear to auscultation bilaterally, without wheezing, rales, or rhonchi.  Abdomen:  Bowel sounds present, soft, non-tender, non-distended. No HSM or hernias noted. No rebound or guarding. No masses appreciated  Neurologic:  Alert and  oriented x4;  grossly normal neurologically. Skin:  Warm and dry, intact without significant lesions.  Psych:  Alert and cooperative. Normal mood and affect.  Intake/Output from previous day: No intake/output data recorded. Intake/Output this shift: No intake/output data recorded.  Lab Results: Recent Labs    12/07/23 1722 12/08/23 0001 12/08/23 0536 12/09/23 0429  WBC 4.7  --  6.9 6.0  HGB 7.0* 8.0* 8.3* 8.6*  HCT 20.5* 23.3* 23.9* 25.7*  PLT 189  --  175 153   BMET Recent Labs    12/07/23 1722 12/08/23 0536 12/09/23 0429  NA 138 137 137  K 2.7* 2.9* 4.0  CL 96* 100 99  CO2 27 26 24   GLUCOSE 96 90 89  BUN 17 20 30*  CREATININE 4.00* 5.03* 6.83*  CALCIUM 8.7* 8.4* 8.5*   LFT Recent Labs    12/07/23 1722 12/08/23 0536 12/09/23 0429  PROT 6.7 6.2* 6.0*  ALBUMIN  3.5 3.1* 3.0*  AST 97* 114* 88*  ALT 56* 65* 59*  ALKPHOS 58 48 50  BILITOT 0.5 0.6 0.7     Assessment: [name] is a 48 year old female with history of ESRD on dialysis MWF, anemia, depression, DVT, HTN, lupus, who presented to the ED with progressive weakness  and dark stools/diarrhea. Hgb 7 on arrival, FOBT positive. GI consulted for further evaluation   Anemia/melena/heme positive stool:  -melena x1 week -no other associated GI symptoms -denies NSAID use -hgb 7 on arrival, up to 8.3, stool heme positive -1 unit PRBCs given on 9/22, hgb this morning is 8.6 -denies any history of GI bleed in the past -BUN notably is WNL -EGD yesterday with gastritis   Etiology of melena, acute on chronic anemia is unclear at this time given EGD yesterday was without source of bleeding. Will need to consider colonoscopy for further evaluation, likely as an outpatient If hgb remains stable and no further bleeding occurs.   Elevated LFTs: -elevated on admission with AST 97 and ALT 56 -peaked yesterday with AST 114, ALT 65, though trending down today, AST 88 and ALT 59 -AP and T bili WNL -ferritin 689 (appears to be chronically elevated since atleast 2009) -platelet count 153k -acute hepatitis panel negative -Hep BsAg negative, Hep BsAb pending  Acutely elevated LFTs, viral hepatitis panel pending. Ferritin remains chronically elevated which can be seen as acute phase reactant in ESRD though cannot rule out underlying hemochromatosis as well. Patient has history of Lupus, elevated LFTs could be secondary to disease state. COVID infection could also be playing a role here. consider further serologies if LFTs remain elevated though this could be done as outpatient given only  mild elevation here. RUQ US  showed a few GB polyps vs nonshadowing gallstone, but no overt liver changes.     Plan: Trend LFTs Consider further serologies if LFTs remain elevated/viral hep panel is negative, can also be done as outpatient given only mild LFT elevation Trend h&h, transfuse for hgb 7 or less Monitor for overt GI bleeding Consider inpatient colonoscopy if hgb declines or further melena/rectal bleeding occurs, otherwise plan for outpatient colonoscopy Can resume plavix   today Soft/renal diet  GI will sign off today.    LOS: 1 day    12/09/2023, 9:05 AM   Karmen Altamirano L. Tameko Halder, MSN, APRN, AGNP-C Adult-Gerontology Nurse Practitioner [facility] Gastroenterology at [facility]

## 2023-12-09 NOTE — Progress Notes (Addendum)
 [facility] Progress Note   Subjective:   underwent EGD yesterday - normal. HB stable this AM at 8.6.  Remains on RA.  She denies new complaints this morning.  Ate some breakfast.  For HD today.   Objective Vitals:   12/08/23 1530 12/08/23 1548 12/08/23 2112 12/09/23 0438  BP: 111/77 122/65 129/66 139/70  Pulse: 80 84 91 94  Resp:  20 18 18   Temp:  98.4 F (36.9 C) 99 F (37.2 C) 98.9 F (37.2 C)  TempSrc:  Oral Oral Oral  SpO2: 100%  90% 91%  Weight:      Height:       Physical Exam General: nontoxic appearing in bed Heart: RRR Lungs: frequency dry cough but O2 sat 92% on RA Abdomen: soft Extremities: no edema Dialysis Access: thigh AVG +t/b  Additional Objective Labs: Basic Metabolic Panel: Recent Labs  Lab 12/07/23 1722 12/08/23 0536 12/09/23 0429  NA 138 137 137  K 2.7* 2.9* 4.0  CL 96* 100 99  CO2 27 26 24   GLUCOSE 96 90 89  BUN 17 20 30*  CREATININE 4.00* 5.03* 6.83*  CALCIUM 8.7* 8.4* 8.5*  PHOS  --   --  1.4*   Liver Function Tests: Recent Labs  Lab 12/07/23 1722 12/08/23 0536 12/09/23 0429  AST 97* 114* 88*  ALT 56* 65* 59*  ALKPHOS 58 48 50  BILITOT 0.5 0.6 0.7  PROT 6.7 6.2* 6.0*  ALBUMIN  3.5 3.1* 3.0*   No results for input(s): LIPASE, AMYLASE in the last 168 hours. CBC: Recent Labs  Lab 12/07/23 1722 12/08/23 0001 12/08/23 0536 12/09/23 0429  WBC 4.7  --  6.9 6.0  NEUTROABS  --   --   --  4.5  HGB 7.0* 8.0* 8.3* 8.6*  HCT 20.5* 23.3* 23.9* 25.7*  MCV 80.4  --  79.4* 79.6*  PLT 189  --  175 153   Blood Culture    Component Value Date/Time   SDES BLOOD RIGHT HAND 02/09/2008 1600   SPECREQUEST BOTTLES DRAWN AEROBIC ONLY 5CC 02/09/2008 1600   CULT NO GROWTH 5 DAYS 02/09/2008 1600   REPTSTATUS [id number] FINAL 02/09/2008 1600    Cardiac Enzymes: No results for input(s): CKTOTAL, CKMB, CKMBINDEX, TROPONINI in the last 168 hours. CBG: No results for input(s): GLUCAP in the last 168 hours. Iron  Studies:  Recent Labs    12/09/23 0429  FERRITIN 689*   @[email or web address] @ Studies/Results: DG CHEST PORT 1 VIEW Result Date: 12/08/2023 CLINICAL DATA:  COVID-19 infection fall EXAM: PORTABLE CHEST 1 VIEW COMPARISON:  January 23, 2022 FINDINGS: Patchy opacity at the right lung base with small to moderate right pleural effusion. Left mid lung and lower lung patchy opacities as well as right midlung opacity. No pneumothorax. Unchanged cardiomediastinal silhouette. Vascular calcification. Left upper extremity vascular stent. IMPRESSION: Consolidation at the right lung base and additional multifocal opacities bilaterally likely representing multifocal infection. Small to moderate right pleural effusion. Electronically Signed   By: Michaeline Blanch M.D.   On: 12/08/2023 13:49   Medications:   calcitRIOL   1.75 mcg Oral Q M,W,F-HD   Chlorhexidine  Gluconate Cloth  6 each Topical Q0600   clopidogrel   75 mg Oral Daily   midodrine   10 mg Oral BID WC   multivitamin  1 tablet Oral Daily   pantoprazole  (PROTONIX ) IV  40 mg Intravenous Q12H   phenytoin   100 mg Oral BID   sertraline   200 mg Oral Daily    [facility] [place] MWF Last tx  9/22 3:30h, EDW 53kg, AVG L thigh 15g, 17H elisio BFR 400, DFR 500, 2/2.5 dialysate RN reports commonly has oozing around needle site Venofer  50 weekly last 9/22 Mircera last dosed last week  Heparing 2500 loading + 1000 hourly Calictriol 1.75 three times per week  9/8 Hb 8.7     Assessment/PlanAnnette Festa is an 48 y.o. female with ESRD on HD, HTN, SLE, DVT, seizure, mod intellectual disabilities, depression currently admitted for GIB and nephrology is consulted for co management of ESRD and associated conditions.    **GIB: Hb 7s, s/p 1u pRBC 9/22 with improvement to 8s stable this AM.  On PPI. GI consult did EGD yesterday and plans outpt colonoscopy if counts stable this admission.  Holding heparin  w dialysis for now.   **ESRD on HD: MWF, had HD last 9/22. Will  dialyze here today.  She is on midodrine  outpt BID, continued here.     **COVID +:  PCR positive here also outpt 9/18, symptoms mild and on RA; per primary.    **Anemia: ABLA + ESRD. Last outpt Hb 8.7, 7 on presentation here.  Transfused with appropriate increase, GI w/u with normal WGD.  Rec'd mircera last week, can dose here if remains admitted but not due yet.  Will hold outpt heparin  given with HD in setting of GIB.   **BMM: does not appear to be on a binder outpt, phos low at 1.4.  Corr ca ok. Cont outpt VDRA dose.     **Hypokalemia:  After oral supplement K 4 this AM, use 4K dialysate today.    **nutrition: phos and k low, suspect poor po intake, added nepro TID between meals. Encouraged meals.    Other chronic issues per primary.  Reach out with concerns.    [name] 12/08/2023, 8:07 AM

## 2023-12-09 NOTE — TOC Progression Note (Signed)
 Transition of Care [facility]) - Progression Note    Patient Details  Name: [name] MRN: [id number] Date of Birth: August 20, 1975  [facility] of [facility]) CM/SW Contact  Hoy DELENA Bigness, LCSW Phone Number: 12/09/2023, 12:47 PM  Clinical Narrative:    Left VM w/ [facility] to confirm pt will be able to use their transportation for HD appointments with COVID + status. Currently awaiting return call.   Expected Discharge Plan: Group Home Barriers to Discharge: Continued Medical Work up               Expected Discharge Plan and Services In-house Referral: Clinical Social Work Discharge Planning Services: NA Post Acute Care Choice: NA Living arrangements for the past 2 months: Group Home                 DME Arranged: N/A DME Agency: NA                   Social Drivers of Health (SDOH) Interventions SDOH Screenings   Food Insecurity: No Food Insecurity (12/08/2023)  Housing: Low Risk  (12/08/2023)  Transportation Needs: No Transportation Needs (12/08/2023)  Utilities: Not At Risk (12/08/2023)  Alcohol  Screen: Low Risk  (07/01/2022)  Depression (PHQ2-9): Low Risk  (07/01/2022)  Financial Resource Strain: Low Risk  (07/01/2022)  Physical Activity: Inactive (07/01/2022)  Social Connections: Socially Isolated (07/01/2022)  Stress: No Stress Concern Present (07/01/2022)  Tobacco Use: High Risk (12/07/2023)    Readmission Risk Interventions     No data to display

## 2023-12-10 ENCOUNTER — Inpatient Hospital Stay (HOSPITAL_COMMUNITY)

## 2023-12-10 DIAGNOSIS — G40909 Epilepsy, unspecified, not intractable, without status epilepticus: Secondary | ICD-10-CM | POA: Diagnosis not present

## 2023-12-10 DIAGNOSIS — D649 Anemia, unspecified: Secondary | ICD-10-CM | POA: Diagnosis not present

## 2023-12-10 DIAGNOSIS — K922 Gastrointestinal hemorrhage, unspecified: Secondary | ICD-10-CM | POA: Diagnosis not present

## 2023-12-10 DIAGNOSIS — F71 Moderate intellectual disabilities: Secondary | ICD-10-CM

## 2023-12-10 LAB — COMPREHENSIVE METABOLIC PANEL WITH GFR
ALT: 48 U/L — ABNORMAL HIGH (ref 0–44)
AST: 67 U/L — ABNORMAL HIGH (ref 15–41)
Albumin: 2.8 g/dL — ABNORMAL LOW (ref 3.5–5.0)
Alkaline Phosphatase: 49 U/L (ref 38–126)
Anion gap: 12 (ref 5–15)
BUN: 21 mg/dL — ABNORMAL HIGH (ref 6–20)
CO2: 27 mmol/L (ref 22–32)
Calcium: 7.9 mg/dL — ABNORMAL LOW (ref 8.9–10.3)
Chloride: 95 mmol/L — ABNORMAL LOW (ref 98–111)
Creatinine, Ser: 4.72 mg/dL — ABNORMAL HIGH (ref 0.44–1.00)
GFR, Estimated: 11 mL/min — ABNORMAL LOW (ref >60)
Glucose, Bld: 86 mg/dL (ref 70–99)
Potassium: 3.6 mmol/L (ref 3.5–5.1)
Sodium: 134 mmol/L — ABNORMAL LOW (ref 135–145)
Total Bilirubin: 0.5 mg/dL (ref 0.0–1.2)
Total Protein: 5.9 g/dL — ABNORMAL LOW (ref 6.5–8.1)

## 2023-12-10 LAB — CBC WITH DIFFERENTIAL/PLATELET
Basophils Absolute: 0 K/uL (ref 0.0–0.1)
Basophils Relative: 0 %
Eosinophils Absolute: 0.1 K/uL (ref 0.0–0.5)
Eosinophils Relative: 2 %
HCT: 24.2 % — ABNORMAL LOW (ref 36.0–46.0)
Hemoglobin: 8.4 g/dL — ABNORMAL LOW (ref 12.0–15.0)
Lymphocytes Relative: 24 %
Lymphs Abs: 1.2 K/uL (ref 0.7–4.0)
MCH: 27.3 pg (ref 26.0–34.0)
MCHC: 34.7 g/dL (ref 30.0–36.0)
MCV: 78.6 fL — ABNORMAL LOW (ref 80.0–100.0)
Monocytes Absolute: 0.1 K/uL (ref 0.1–1.0)
Monocytes Relative: 2 %
Neutro Abs: 3.6 K/uL (ref 1.7–7.7)
Neutrophils Relative %: 72 %
Platelets: 171 K/uL (ref 150–400)
RBC: 3.08 MIL/uL — ABNORMAL LOW (ref 3.87–5.11)
RDW: 16.7 % — ABNORMAL HIGH (ref 11.5–15.5)
WBC: 5 K/uL (ref 4.0–10.5)
nRBC: 0 % (ref 0.0–0.2)

## 2023-12-10 LAB — HEPATITIS B SURFACE ANTIBODY, QUANTITATIVE: Hep B S AB Quant (Post): 91.1 m[IU]/mL

## 2023-12-10 LAB — FERRITIN: Ferritin: 601 ng/mL — ABNORMAL HIGH (ref 11–307)

## 2023-12-10 LAB — SEDIMENTATION RATE: Sed Rate: 33 mm/h — ABNORMAL HIGH (ref 0–20)

## 2023-12-10 LAB — C-REACTIVE PROTEIN: CRP: 9.7 mg/dL — ABNORMAL HIGH (ref <1.0)

## 2023-12-10 NOTE — Anesthesia Postprocedure Evaluation (Signed)
 Anesthesia Post Note  Patient: [name]  Procedure(s) Performed: EGD (ESOPHAGOGASTRODUODENOSCOPY)  Patient location during evaluation: Phase II Anesthesia Type: General Level of consciousness: awake Pain management: pain level controlled Vital Signs Assessment: post-procedure vital signs reviewed and stable Respiratory status: spontaneous breathing and respiratory function stable Cardiovascular status: blood pressure returned to baseline and stable Postop Assessment: no headache and no apparent nausea or vomiting Anesthetic complications: no Comments: Late entry   No notable events documented.   Last Vitals:  Vitals:   12/10/23 0506 12/10/23 0812  BP: 118/72 114/75  Pulse: 85 87  Resp: 18 18  Temp: 36.9 C 36.8 C  SpO2:  99%    Last Pain:  Vitals:   12/10/23 0812  TempSrc: Oral  PainSc: 0-No pain                 Yvonna JINNY Bosworth

## 2023-12-10 NOTE — Progress Notes (Signed)
 D/c orders noted. Contacted out-pt H[facility] clinic, [facility] [place], to inform of pt d//c and anticipated arrival back tomorrow per schedule. D/c summ and last note have been faxed, no further support needed.   Mustaf Antonacci Dialysis Navigator [phone]

## 2023-12-10 NOTE — Discharge Instructions (Signed)
IMPORTANT INFORMATION: PAY CLOSE ATTENTION   PHYSICIAN DISCHARGE INSTRUCTIONS  Follow with Primary care provider  Virginia Rochester, NP  and other consultants as instructed by your Hospitalist Physician  [place] IF SYMPTOMS COME BACK, WORSEN OR NEW PROBLEM DEVELOPS   Please note: You were cared for by a hospitalist during your hospital stay. Every effort will be made to forward records to your primary care provider.  You can request that your primary care provider send for your hospital records if they have not received them.  Once you are discharged, your primary care physician will handle any further medical issues. Please note that NO REFILLS for any discharge medications will be authorized once you are discharged, as it is imperative that you return to your primary care physician (or establish a relationship with a primary care physician if you do not have one) for your post hospital discharge needs so that they can reassess your need for medications and monitor your lab values.  Please get a complete blood count and chemistry panel checked by your Primary MD at your next visit, and again as instructed by your Primary MD.  Get Medicines reviewed and adjusted: Please take all your medications with you for your next visit with your Primary MD  Laboratory/radiological data: Please request your Primary MD to go over all hospital tests and procedure/radiological results at the follow up, please ask your primary care provider to get all Hospital records sent to his/her office.  In some cases, they will be blood work, cultures and biopsy results pending at the time of your discharge. Please request that your primary care provider follow up on these results.  If you are diabetic, please bring your blood sugar readings with you to your follow up appointment with primary care.    Please call and make your follow up appointments as soon as possible.    Also Note  the following: If you experience worsening of your admission symptoms, develop shortness of breath, life threatening emergency, suicidal or homicidal thoughts you must seek medical attention immediately by calling 911 or calling your MD immediately  if symptoms less severe.  You must read complete instructions/literature along with all the possible adverse reactions/side effects for all the Medicines you take and that have been prescribed to you. Take any new Medicines after you have completely understood and accpet all the possible adverse reactions/side effects.   Do not drive when taking Pain medications or sleeping medications (Benzodiazepines)  Do not take more than prescribed Pain, Sleep and Anxiety Medications. It is not advisable to combine anxiety,sleep and pain medications without talking with your primary care practitioner  Special Instructions: If you have smoked or chewed Tobacco  in the last 2 yrs please stop smoking, stop any regular Alcohol  and or any Recreational drug use.  Wear Seat belts while driving.  Do not drive if taking any narcotic, mind altering or controlled substances or recreational drugs or alcohol.

## 2023-12-10 NOTE — NC FL2 (Signed)
 [place]  MEDICAID FL2 LEVEL OF CARE FORM     IDENTIFICATION  Patient Name: [name] Birthdate: 05/21/1975 Sex: female Admission Date (Current Location): 12/07/2023  [place] and [place] Number:  Raynaldo [id number] Facility and Address:  [facility],  [address], [place] [address]      Provider Number: [id number]  Attending Physician Name and Address:  Vicci Afton CROME, MD  Relative Name and Phone Number:  Dewayne Clarity (Legal Guardian)  [phone]    Current Level of Care: Hospital Recommended Level of Care: [facility], Other (Comment) (Group Home) Prior Approval Number:    Date Approved/Denied:   PASRR Number:    Discharge Plan: Other (Comment) ([facility])    Current Diagnoses: Patient Active Problem List   Diagnosis Date Noted   Acute GI bleeding 12/08/2023   Upper GI bleed 12/07/2023   Acute anemia 12/07/2023   Encounter for screening fecal occult blood testing 07/01/2022   Encounter for gynecological examination with Papanicolaou smear of cervix 07/01/2022   Hemorrhage from arteriovenous dialysis graft 02/08/2022   Chronic hypotension 02/08/2022   Diabetes (HCC) 11/02/2018   Lipoma of extremity    Hyperkalemia 10/23/2016   Complication of vascular access for dialysis 04/01/2016   Seizure disorder (HCC) 10/01/2015   Generalized muscle weakness 10/01/2015   Vaginal discharge 11/08/2013   Vaginal odor 11/08/2013   BV (bacterial vaginosis) 11/08/2013   ESRD on hemodialysis (HCC) 12/27/2010   Anemia in chronic kidney disease 12/27/2010   Major depressive disorder with single episode 12/27/2010   Secondary hyperparathyroidism of renal origin 12/27/2010   Systemic lupus erythematosus, organ or system involvement unspecified (HCC) 12/27/2010   Type 2 diabetes mellitus without complication 12/27/2010   SWELLING, NECK 04/02/2007   Asthma 03/15/2007   FOOT PAIN, RIGHT 03/15/2007   Cough 03/15/2007   MENTAL RETARDATION,  MODERATE 01/19/2007   STATUS, MENTAL, ALTERED 07/16/2006   ANOREXIA 05/26/2006   Disease of pericardium 05/15/2006   Tachycardia 05/15/2006   Lupus 04/01/2006   ANEMIA-NOS 03/31/2006   LEUKOCYTOSIS 03/31/2006   Depression with anxiety 03/31/2006   Migraine headache 03/31/2006   Hereditary and idiopathic peripheral neuropathy 03/31/2006   Essential (primary) hypertension 03/31/2006   GERD 03/31/2006   Osteoarthritis 03/31/2006   Other specified abnormal findings of blood chemistry 03/31/2006    Orientation RESPIRATION BLADDER Height & Weight     Self, Time, Situation, Place  Normal Continent Weight: 110 lb 10.7 oz (50.2 kg) Height:  5' 3 (160 cm)  BEHAVIORAL SYMPTOMS/MOOD NEUROLOGICAL BOWEL NUTRITION STATUS      Continent Diet (See DC summary)  AMBULATORY STATUS COMMUNICATION OF NEEDS Skin   Supervision Verbally Normal                       Personal Care Assistance Level of Assistance  Bathing, Feeding, Dressing Bathing Assistance: Independent Feeding assistance: Independent Dressing Assistance: Independent     Functional Limitations Info  Sight, Hearing, Speech Sight Info: Impaired (eyeglasses) Hearing Info: Adequate Speech Info: Adequate    SPECIAL CARE FACTORS FREQUENCY                       Contractures Contractures Info: Not present    Additional Factors Info  Code Status, Allergies, Psychotropic Code Status Info: FULL Allergies Info: Dust Mite Extract Psychotropic Info: Zoloft          Current Medications (12/10/2023):  This is the current hospital active medication list Current Facility-Administered Medications  Medication Dose Route Frequency  Provider Last Rate Last Admin   acetaminophen  (TYLENOL ) tablet 650 mg  650 mg Oral Q6H PRN Emokpae, Ejiroghene E, MD   650 mg at 12/08/23 1809   Or   acetaminophen  (TYLENOL ) suppository 650 mg  650 mg Rectal Q6H PRN Emokpae, Ejiroghene E, MD       calcitRIOL  (ROCALTROL ) capsule 1.75 mcg  1.75 mcg  Oral Q M,W,F-HD Norine Manuelita LABOR, MD       Chlorhexidine  Gluconate Cloth 2 % PADS 6 each  6 each Topical Q0600 Norine Manuelita LABOR, MD   6 each at 12/09/23 0537   clopidogrel  (PLAVIX ) tablet 75 mg  75 mg Oral Daily Johnson, Clanford L, MD   75 mg at 12/10/23 0813   feeding supplement (NEPRO CARB STEADY) liquid 237 mL  237 mL Oral TID BM Kruska, Lindsay A, MD   237 mL at 12/10/23 0813   midodrine  (PROAMATINE ) tablet 10 mg  10 mg Oral BID WC Emokpae, Ejiroghene E, MD   10 mg at 12/10/23 0813   multivitamin (RENA-VIT) tablet 1 tablet  1 tablet Oral Daily Emokpae, Ejiroghene E, MD   1 tablet at 12/10/23 0813   ondansetron  (ZOFRAN ) tablet 4 mg  4 mg Oral Q6H PRN Emokpae, Ejiroghene E, MD       Or   ondansetron  (ZOFRAN ) injection 4 mg  4 mg Intravenous Q6H PRN Emokpae, Ejiroghene E, MD       pantoprazole  (PROTONIX ) injection 40 mg  40 mg Intravenous Q12H Emokpae, Ejiroghene E, MD   40 mg at 12/10/23 0813   phenytoin  (DILANTIN ) ER capsule 100 mg  100 mg Oral BID Emokpae, Ejiroghene E, MD   100 mg at 12/10/23 0813   polyethylene glycol (MIRALAX  / GLYCOLAX ) packet 17 g  17 g Oral Daily PRN Emokpae, Ejiroghene E, MD       sertraline  (ZOLOFT ) tablet 200 mg  200 mg Oral Daily Emokpae, Ejiroghene E, MD   200 mg at 12/10/23 0813     Discharge Medications: TAKE these medications     albuterol  (2.5 MG/3ML) 0.083% nebulizer solution Commonly known as: PROVENTIL  Take 2.5 mg by nebulization 4 (four) times daily as needed for wheezing or shortness of breath.    ALPRAZolam  0.25 MG tablet Commonly known as: XANAX  Take 0.25 mg by mouth 2 (two) times daily.    ammonium lactate  12 % lotion Commonly known as: Amlactin Daily Apply 1 Application topically as needed for dry skin.    chlorhexidine  0.12 % solution Commonly known as: PERIDEX  Use as directed 15 mLs in the mouth or throat 2 (two) times daily. After regular toothbrushing, rinse mouth    clopidogrel  75 MG tablet Commonly known as: PLAVIX  Take 75 mg  by mouth daily.    cycloSPORINE 0.05 % ophthalmic emulsion Commonly known as: RESTASIS Place 1 drop into both eyes 2 (two) times daily.    docusate sodium  100 MG capsule Commonly known as: COLACE Take 100 mg by mouth daily.    epoetin  alfa 4000 UNIT/ML injection Commonly known as: EPOGEN  Inject 4,000 Units into the vein See admin instructions. Mon, Wed, and Friday at dialysis    guaiFENesin  100 MG/5ML liquid Commonly known as: ROBITUSSIN Take 200 mg by mouth every 6 (six) hours as needed for cough.    hydroxychloroquine  200 MG tablet Commonly known as: PLAQUENIL  Take 200 mg by mouth daily.    ipratropium 0.02 % nebulizer solution Commonly known as: ATROVENT  Take 0.5 mg by nebulization 4 (four) times daily as needed for wheezing  or shortness of breath (For Breathing).    lidocaine -prilocaine  cream Commonly known as: EMLA  Apply 1 application topically as needed (port access).    loratadine 10 MG tablet Commonly known as: CLARITIN Take 10 mg by mouth daily as needed for allergies.    midodrine  10 MG tablet Commonly known as: PROAMATINE  Take 10 mg by mouth 2 (two) times daily.    mirtazapine  15 MG tablet Commonly known as: REMERON  Take 7.5 mg by mouth at bedtime.    multivitamin Tabs tablet Take 1 tablet by mouth daily.    pantoprazole  40 MG tablet Commonly known as: PROTONIX  Take 40 mg by mouth daily.    phenytoin  100 MG ER capsule Commonly known as: DILANTIN  Take 100 mg by mouth 2 (two) times daily.    sertraline  100 MG tablet Commonly known as: ZOLOFT  Take 200 mg by mouth daily.    simvastatin  40 MG tablet Commonly known as: ZOCOR  Take 40 mg by mouth at bedtime.    TUMS PO Take 1,000 mg by mouth 2 (two) times daily between meals.    Relevant Imaging Results:  Relevant Lab Results:   Additional Information SSN: [id number]  Hoy DELENA Bigness, LCSW

## 2023-12-10 NOTE — TOC Transition Note (Signed)
 Transition of Care [facility]) - Discharge Note   Patient Details  Name: [name] MRN: [id number] Date of Birth: Jul 18, 1975  [facility] CM/SW Contact:  Hoy DELENA Bigness, LCSW Phone Number: 12/10/2023, 1:08 PM   Clinical Narrative:    Pt to return to [facility] group home. Spoke with pt's Legal Guardian, to inform of discharge plans. Spoke with Randie at pt's group home and confirmed her return. FL-2 faxed to [phone] for review. [facility] staff unable to provide transport for pt. [facility] has been called at 1:13pm for transportation.   Final next level of care: Group Home Barriers to Discharge: Barriers Resolved   Patient Goals and CMS Choice Patient states their goals for this hospitalization and ongoing recovery are:: For pt to return to her group home CMS Medicare.gov Compare Post Acute Care list provided to:: Legal Guardian Choice offered to / list presented to : [facility] POA / Guardian [place] ownership interest in [facility].provided to::  (NA)    Discharge Placement                       Discharge Plan and Services Additional resources added to the After Visit Summary for   In-house Referral: Clinical Social Work Discharge Planning Services: NA Post Acute Care Choice: NA          DME Arranged: N/A DME Agency: NA                  Social Drivers of Health (SDOH) Interventions SDOH Screenings   Food Insecurity: No Food Insecurity (12/08/2023)  Housing: Low Risk  (12/08/2023)  Transportation Needs: No Transportation Needs (12/08/2023)  Utilities: Not At Risk (12/08/2023)  Alcohol  Screen: Low Risk  (07/01/2022)  Depression (PHQ2-9): Low Risk  (07/01/2022)  Financial Resource Strain: Low Risk  (07/01/2022)  Physical Activity: Inactive (07/01/2022)  Social Connections: Socially Isolated (07/01/2022)  Stress: No Stress Concern Present (07/01/2022)  Tobacco Use: High Risk (12/07/2023)     Readmission Risk Interventions     12/10/2023    1:07 PM  Readmission Risk Prevention Plan  Transportation Screening Complete  PCP or Specialist Appt within 5-7 Days Complete  Home Care Screening Complete  Medication Review (RN CM) Complete

## 2023-12-10 NOTE — Progress Notes (Signed)
 Admit: 12/07/2023 LOS: 2   [name] is an 48 y.o. female with ESRD on HD, HTN, SLE, DVT, seizure, mod intellectual disabilities, depression currently admitted for GIB and nephrology is consulted for co management of ESRD and associated conditions.    Admitted to [facility] yesterday PM after presenting with 2d h/o black stools.  Her Hb was 7, last at dialysis 9/7 was 8.7.  Plt 189.  She was transfused 1u pRBC with Hb 8 > 8.3 this AM.  GI consult pending.    She had a full HD yesterday.  BUN 17 on admission.  K has been in the 2.7 > 2.9 range during admission. Mild transaminitis. Tm 99.8, BP 110-140s, HR 90s.   Subjective:  No events reported overnight.  Seen in bed.  At baseline.  On room air.  Conversational.  No issues with dialysis yesterday.  3-hour, UF 500.  No issues.  09/24 0701 - 09/25 0700 In: 480 [P.O.:480] Out: 500   Filed Weights   12/07/23 1656 12/09/23 1047 12/09/23 1442  Weight: 58 kg 50.8 kg 50.2 kg    Scheduled Meds:  calcitRIOL   1.75 mcg Oral Q M,W,F-HD   Chlorhexidine  Gluconate Cloth  6 each Topical Q0600   clopidogrel   75 mg Oral Daily   feeding supplement (NEPRO CARB STEADY)  237 mL Oral TID BM   midodrine   10 mg Oral BID WC   multivitamin  1 tablet Oral Daily   pantoprazole  (PROTONIX ) IV  40 mg Intravenous Q12H   phenytoin   100 mg Oral BID   sertraline   200 mg Oral Daily   Continuous Infusions: PRN Meds:.acetaminophen  **OR** acetaminophen , ondansetron  **OR** ondansetron  (ZOFRAN ) IV, polyethylene glycol   Outpt HD Orders [facility] MWF Last tx 9/22 3:30h, EDW 53kg, AVG L thigh 15g, 17H elisio BFR 400, DFR 500, 2/2.5 dialysate RN reports commonly has oozing around needle site Venofer  50 weekly last 9/22 Mircera last dosed last week  Heparin  2500 loading + 1000 hourly Calictriol 1.75 three times per week    Physical Exam:  Blood pressure 114/75, pulse 87, temperature 98.2 F (36.8 C), temperature source Oral, resp. rate 18, height 5' 3 (1.6  m), weight 50.2 kg, last menstrual period 10/28/2023, SpO2 99%. GEN: wdwn, sitting in bed, nad ENT: no nasal discharge, mmm EYES: no scleral icterus, eomi CV: normal rate, no murmurs PULM: no iwob, bilateral chest rise ABD: NABS, non-distended SKIN: no rashes or jaundice EXT: no edema, warm and well perfused  Assessment/Plan GIB: GI consulted.  S/p pRBC. On PPI ESKD on HD: MWF: Plan HD today. Cont midodrine   COVID: PCR +; mild sx. Per primary  Anemia: GIB as above.  Rec'd Mircera last week.  Hold heparin  2/2 GIB.   BMM: Not on outpt binders.  cCa good.  Phos low.  Replace per primary.  Cont OP VDRA dose  Hypokalemia: Resolved. Replaced per primary   Dr. Evalene Lanes 12/10/2023, 8:54 AM  Recent Labs  Lab 12/08/23 0536 12/09/23 0429 12/10/23 0434  NA 137 137 134*  K 2.9* 4.0 3.6  CL 100 99 95*  CO2 26 24 27   GLUCOSE 90 89 86  BUN 20 30* 21*  CREATININE 5.03* 6.83* 4.72*  CALCIUM 8.4* 8.5* 7.9*  PHOS  --  1.4*  --    Recent Labs  Lab 12/08/23 0536 12/09/23 0429 12/10/23 0434  WBC 6.9 6.0 5.0  NEUTROABS  --  4.5 3.6  HGB 8.3* 8.6* 8.4*  HCT 23.9* 25.7* 24.2*  MCV 79.4* 79.6*  78.6*  PLT 175 153 171

## 2023-12-10 NOTE — Discharge Summary (Signed)
 Physician Discharge Summary  [name] FMW:[id number] DOB: [birth year] DOA: 12/07/2023  PCP: Gammon, Chrystal, NP  Admit date: 12/07/2023 Discharge date: 12/10/2023  Disposition: Home   Recommendations for Outpatient Follow-up:  Follow up with PCP in 1 weeks Follow up with [facility] regarding colonoscopy in 3-4 weeks Please obtain BMP/CBC in 1 week Please resume regular hemodialysis schedule  Discharge Condition: STABLE   CODE STATUS: FULL DIET: renal    Brief Hospitalization Summary: Please see all hospital notes, images, labs for full details of the hospitalization. HPI:  48 year old female with history of  ESRD on dialysis, diabetes mellitus, DVT, hypertension, seizure, SLE, moderate intellectual disabilities presented with black stools.  On presentation, hemoglobin was 7 down from baseline of more than 12, last checked a year ago with AST of 97 8, ALT of 56.  She was transfused 1 unit packed red cells.  GI was consulted.  He was started on IV Protonix .   Hospital course by listed problems addressed  upper GI bleeding - - RESOLVED  Acute blood loss anemia -On presentation, hemoglobin was 7 down from baseline of more than 12, last checked a year ago.  Unclear what her recent baseline was. -Transfused 1 unit packed red cells on admission.  Hemoglobin 8.4 today.  Continue IV Protonix .  Plavix  held initially, per GI can restart plavix  9/24 and rechecked Hg has been stable after restarting plavix  -- etiology of melena, and anemia was unclear after EGD with no clear source of bleeding found, GI has recommended outpatient colonoscopy for further evaluation.  Pt to follow up with [facility] in 3-4 weeks for colonoscopy arrangements.    Hypokalemia - Repleted    ESRD on hemodialysis - HD completed 9/24 per nephrology.  Consulted nephrology for inpatient dialysis   Hypertension - Monitored blood pressure.  Not on any antihypertensives.  Looks like she is on midodrine  for  intermittent hypotension which has been continued   Acute transaminitis - Questionable cause.  Acute hepatitis panel negative, LFTs trending down, outpatient follow up with [facility] for recheck of labs and further work up as needed.    Hyperlipidemia - resume home statin as LFTs are trending down   COVID-19 infection - Tested positive for COVID on 12/03/2023 as an outpatient.  CXR showing infiltrates, recheck in AM after dialysis today; remains on room air oxygen and asymptomatic. Inflammatory markers trending down;    Seizure disorder - Continue phenytoin    Diabetes mellitus type 2 -diet controlled.  Blood sugars currently stable.  Discharge Diagnoses:  Principal Problem:   Upper GI bleed Active Problems:   MENTAL RETARDATION, MODERATE   Essential (primary) hypertension   Asthma   Cough   ESRD on hemodialysis (HCC)   Seizure disorder (HCC)   Systemic lupus erythematosus, organ or system involvement unspecified (HCC)   Diabetes (HCC)   Acute anemia   Acute GI bleeding   Discharge Instructions:  Allergies as of 12/10/2023       Reactions   Dust Mite Extract Other (See Comments)   sneezing        Medication List     STOP taking these medications    aspirin EC 81 MG tablet       TAKE these medications    albuterol  (2.5 MG/3ML) 0.083% nebulizer solution Commonly known as: PROVENTIL  Take 2.5 mg by nebulization 4 (four) times daily as needed for wheezing or shortness of breath.   ALPRAZolam  0.25 MG tablet Commonly known as: XANAX  Take 0.25 mg by mouth  2 (two) times daily.   ammonium lactate  12 % lotion Commonly known as: Amlactin Daily Apply 1 Application topically as needed for dry skin.   chlorhexidine  0.12 % solution Commonly known as: PERIDEX  Use as directed 15 mLs in the mouth or throat 2 (two) times daily. After regular toothbrushing, rinse mouth   clopidogrel  75 MG tablet Commonly known as: PLAVIX  Take 75 mg by mouth daily.    cycloSPORINE 0.05 % ophthalmic emulsion Commonly known as: RESTASIS Place 1 drop into both eyes 2 (two) times daily.   docusate sodium  100 MG capsule Commonly known as: COLACE Take 100 mg by mouth daily.   epoetin  alfa 4000 UNIT/ML injection Commonly known as: EPOGEN  Inject 4,000 Units into the vein See admin instructions. Mon, Wed, and Friday at dialysis   guaiFENesin  100 MG/5ML liquid Commonly known as: ROBITUSSIN Take 200 mg by mouth every 6 (six) hours as needed for cough.   hydroxychloroquine  200 MG tablet Commonly known as: PLAQUENIL  Take 200 mg by mouth daily.   ipratropium 0.02 % nebulizer solution Commonly known as: ATROVENT  Take 0.5 mg by nebulization 4 (four) times daily as needed for wheezing or shortness of breath (For Breathing).   lidocaine -prilocaine  cream Commonly known as: EMLA  Apply 1 application topically as needed (port access).   loratadine 10 MG tablet Commonly known as: CLARITIN Take 10 mg by mouth daily as needed for allergies.   midodrine  10 MG tablet Commonly known as: PROAMATINE  Take 10 mg by mouth 2 (two) times daily.   mirtazapine  15 MG tablet Commonly known as: REMERON  Take 7.5 mg by mouth at bedtime.   multivitamin Tabs tablet Take 1 tablet by mouth daily.   pantoprazole  40 MG tablet Commonly known as: PROTONIX  Take 40 mg by mouth daily.   phenytoin  100 MG ER capsule Commonly known as: DILANTIN  Take 100 mg by mouth 2 (two) times daily.   sertraline  100 MG tablet Commonly known as: ZOLOFT  Take 200 mg by mouth daily.   simvastatin  40 MG tablet Commonly known as: ZOCOR  Take 40 mg by mouth at bedtime.   TUMS PO Take 1,000 mg by mouth 2 (two) times daily between meals.        Follow-up Information     Gammon, Chrystal, NP. Schedule an appointment as soon as possible for a visit in 1 week(s).   Specialty: Nurse Practitioner Why: Hospital Follow Up Contact information: [address] [place]  [address] [phone]                Allergies  Allergen Reactions   Dust Mite Extract Other (See Comments)    sneezing   Allergies as of 12/10/2023       Reactions   Dust Mite Extract Other (See Comments)   sneezing        Medication List     STOP taking these medications    aspirin EC 81 MG tablet       TAKE these medications    albuterol  (2.5 MG/3ML) 0.083% nebulizer solution Commonly known as: PROVENTIL  Take 2.5 mg by nebulization 4 (four) times daily as needed for wheezing or shortness of breath.   ALPRAZolam  0.25 MG tablet Commonly known as: XANAX  Take 0.25 mg by mouth 2 (two) times daily.   ammonium lactate  12 % lotion Commonly known as: Amlactin Daily Apply 1 Application topically as needed for dry skin.   chlorhexidine  0.12 % solution Commonly known as: PERIDEX  Use as directed 15 mLs in the mouth or throat 2 (  two) times daily. After regular toothbrushing, rinse mouth   clopidogrel  75 MG tablet Commonly known as: PLAVIX  Take 75 mg by mouth daily.   cycloSPORINE 0.05 % ophthalmic emulsion Commonly known as: RESTASIS Place 1 drop into both eyes 2 (two) times daily.   docusate sodium  100 MG capsule Commonly known as: COLACE Take 100 mg by mouth daily.   epoetin  alfa 4000 UNIT/ML injection Commonly known as: EPOGEN  Inject 4,000 Units into the vein See admin instructions. Mon, Wed, and Friday at dialysis   guaiFENesin  100 MG/5ML liquid Commonly known as: ROBITUSSIN Take 200 mg by mouth every 6 (six) hours as needed for cough.   hydroxychloroquine  200 MG tablet Commonly known as: PLAQUENIL  Take 200 mg by mouth daily.   ipratropium 0.02 % nebulizer solution Commonly known as: ATROVENT  Take 0.5 mg by nebulization 4 (four) times daily as needed for wheezing or shortness of breath (For Breathing).   lidocaine -prilocaine  cream Commonly known as: EMLA  Apply 1 application topically as needed (port access).   loratadine 10 MG  tablet Commonly known as: CLARITIN Take 10 mg by mouth daily as needed for allergies.   midodrine  10 MG tablet Commonly known as: PROAMATINE  Take 10 mg by mouth 2 (two) times daily.   mirtazapine  15 MG tablet Commonly known as: REMERON  Take 7.5 mg by mouth at bedtime.   multivitamin Tabs tablet Take 1 tablet by mouth daily.   pantoprazole  40 MG tablet Commonly known as: PROTONIX  Take 40 mg by mouth daily.   phenytoin  100 MG ER capsule Commonly known as: DILANTIN  Take 100 mg by mouth 2 (two) times daily.   sertraline  100 MG tablet Commonly known as: ZOLOFT  Take 200 mg by mouth daily.   simvastatin  40 MG tablet Commonly known as: ZOCOR  Take 40 mg by mouth at bedtime.   TUMS PO Take 1,000 mg by mouth 2 (two) times daily between meals.        Procedures/Studies: DG CHEST PORT 1 VIEW Result Date: 12/10/2023 EXAM: 1 VIEW(S) XRAY OF THE CHEST 12/10/2023 04:30:00 AM COMPARISON: 12/08/2023 CLINICAL HISTORY: COVID-19 virus infection. 3rd SHIFT MORNING ROUTINE follow-up IMAGE timed for 0600 FOR: COVID-19 virus infection. FINDINGS: LINES, TUBES AND DEVICES: There is a stent identified within the left innominate vein. LUNGS AND PLEURA: Persistent diffuse interstitial opacities along with scattered patchy airspace opacities in the right upper and right lower lung. Unchanged appearance of small to moderate right pleural effusion. No pulmonary edema. No pneumothorax. HEART AND MEDIASTINUM: No acute abnormality of the cardiac and mediastinal silhouettes. BONES AND SOFT TISSUES: Bilateral avascular necrosis identified within the humeral heads. IMPRESSION: 1. Persistent diffuse interstitial opacities which may be seen with pulmonary edema and/or atypical viral infection. 2. Patchy airspace opacities in the right upper and right lower lobes unchanged from prior. 3. Unchanged small to moderate right pleural effusion. Electronically signed by: Waddell Calk MD 12/10/2023 06:07 AM EDT RP  Workstation: [id number]   US  Abdomen Limited RUQ (LIVER/GB) Result Date: 12/09/2023 CLINICAL DATA:  Elevated LFTs EXAM: ULTRASOUND ABDOMEN LIMITED RIGHT UPPER QUADRANT COMPARISON:  None Available. FINDINGS: Gallbladder: No gallstones or wall thickening visualized. No sonographic Murphy sign noted by sonographer. Possible small gallbladder polyp measuring up to 0.5 cm versus nonshadowing gallstone along the dependent portion of the gallbladder. Gallbladder is relatively decompressed. Common bile duct: Diameter: 2.0 mm. Liver: No focal lesion identified. Within normal limits in parenchymal echogenicity. Portal vein is patent on color Doppler imaging with normal direction of blood flow towards the liver. Other: None. IMPRESSION:  Tiny 0.5 cm gallbladder polyp versus nonshadowing gallstone. No follow-up is indicated if patient has no prior history of malignancy. Electronically Signed   By: Michaeline Blanch M.D.   On: 12/09/2023 15:07   DG CHEST PORT 1 VIEW Result Date: 12/08/2023 CLINICAL DATA:  COVID-19 infection fall EXAM: PORTABLE CHEST 1 VIEW COMPARISON:  January 23, 2022 FINDINGS: Patchy opacity at the right lung base with small to moderate right pleural effusion. Left mid lung and lower lung patchy opacities as well as right midlung opacity. No pneumothorax. Unchanged cardiomediastinal silhouette. Vascular calcification. Left upper extremity vascular stent. IMPRESSION: Consolidation at the right lung base and additional multifocal opacities bilaterally likely representing multifocal infection. Small to moderate right pleural effusion. Electronically Signed   By: Michaeline Blanch M.D.   On: 12/08/2023 13:49   MM 3D SCREENING MAMMOGRAM BILATERAL BREAST Result Date: 12/07/2023 CLINICAL DATA:  Screening. EXAM: DIGITAL SCREENING BILATERAL MAMMOGRAM WITH TOMOSYNTHESIS AND CAD TECHNIQUE: Bilateral screening digital craniocaudal and mediolateral oblique mammograms were obtained. Bilateral screening digital breast  tomosynthesis was performed. The images were evaluated with computer-aided detection. COMPARISON:  Previous exam(s). ACR Breast Density Category d: The breasts are extremely dense, which lowers the sensitivity of mammography. FINDINGS: There are no findings suspicious for malignancy. Limited visualization of the left axilla due to difficulty with patient positioning. IMPRESSION: No mammographic evidence of malignancy. A result letter of this screening mammogram will be mailed directly to the patient. RECOMMENDATION: Screening mammogram in one year. (Code:[id number]) BI-RADS CATEGORY  1: Negative. Electronically Signed   By: Inocente Ast M.D.   On: 12/07/2023 12:03     Subjective:   Discharge Exam: Vitals:   12/10/23 0506 12/10/23 0812  BP: 118/72 114/75  Pulse: 85 87  Resp: 18 18  Temp: 98.4 F (36.9 C) 98.2 F (36.8 C)  SpO2:  99%   Vitals:   12/09/23 1500 12/09/23 1932 12/10/23 0506 12/10/23 0812  BP: 120/67 116/70 118/72 114/75  Pulse: 86 91 85 87  Resp: 16 17 18 18   Temp: 97.9 F (36.6 C) 99.5 F (37.5 C) 98.4 F (36.9 C) 98.2 F (36.8 C)  TempSrc: Oral Oral Oral Oral  SpO2: 100% 98%  99%  Weight:      Height:        General: Pt is alert, awake, not in acute distress Cardiovascular: RRR, S1/S2 +, no rubs, no gallops Respiratory: CTA bilaterally, no wheezing, no rhonchi Abdominal: Soft, NT, ND, bowel sounds + Extremities: no edema, no cyanosis   The results of significant diagnostics from this hospitalization (including imaging, microbiology, ancillary and laboratory) are listed below for reference.     Microbiology: Recent Results (from the past 240 hours)  Resp panel by RT-PCR (RSV, Flu A&B, Covid) Anterior Nasal Swab     Status: Abnormal   Collection Time: 12/07/23  8:16 PM   Specimen: Anterior Nasal Swab  Result Value Ref Range Status   SARS Coronavirus 2 by RT PCR POSITIVE (A) NEGATIVE Final    Comment: (NOTE) SARS-CoV-2 target nucleic acids are  DETECTED.  The SARS-CoV-2 RNA is generally detectable in upper respiratory specimens during the acute phase of infection. Positive results are indicative of the presence of the identified virus, but do not rule out bacterial infection or co-infection with other pathogens not detected by the test. Clinical correlation with patient history and other diagnostic information is necessary to determine patient infection status. The expected result is Negative.  Fact Sheet for Patients: BloggerCourse.com  Fact Sheet for Healthcare Providers:  SeriousBroker.it  This test is not yet approved or cleared by the [place]  FDA and  has been authorized for detection and/or diagnosis of SARS-CoV-2 by FDA under an Emergency Use Authorization (EUA).  This EUA will remain in effect (meaning this test can be used) for the duration of  the COVID-19 declaration under Section 564(b)(1) of the A ct, 21 U.S.C. section 360bbb-3(b)(1), unless the authorization is terminated or revoked sooner.     Influenza A by PCR NEGATIVE NEGATIVE Final   Influenza B by PCR NEGATIVE NEGATIVE Final    Comment: (NOTE) The Xpert Xpress SARS-CoV-2/FLU/RSV plus assay is intended as an aid in the diagnosis of influenza from Nasopharyngeal swab specimens and should not be used as a sole basis for treatment. Nasal washings and aspirates are unacceptable for Xpert Xpress SARS-CoV-2/FLU/RSV testing.  Fact Sheet for Patients: BloggerCourse.com  Fact Sheet for Healthcare Providers: SeriousBroker.it  This test is not yet approved or cleared by the [place]  FDA and has been authorized for detection and/or diagnosis of SARS-CoV-2 by FDA under an Emergency Use Authorization (EUA). This EUA will remain in effect (meaning this test can be used) for the duration of the COVID-19 declaration under Section 564(b)(1) of the Act, 21  U.S.C. section 360bbb-3(b)(1), unless the authorization is terminated or revoked.     Resp Syncytial Virus by PCR NEGATIVE NEGATIVE Final    Comment: (NOTE) Fact Sheet for Patients: BloggerCourse.com  Fact Sheet for Healthcare Providers: SeriousBroker.it  This test is not yet approved or cleared by the [place]  FDA and has been authorized for detection and/or diagnosis of SARS-CoV-2 by FDA under an Emergency Use Authorization (EUA). This EUA will remain in effect (meaning this test can be used) for the duration of the COVID-19 declaration under Section 564(b)(1) of the Act, 21 U.S.C. section 360bbb-3(b)(1), unless the authorization is terminated or revoked.  Performed at [facility], [address] [place], [place] [address]      Labs: BNP (last 3 results) No results for input(s): BNP in the last 8760 hours. Basic Metabolic Panel: Recent Labs  Lab 12/07/23 1722 12/08/23 0536 12/09/23 0429 12/10/23 0434  NA 138 137 137 134*  K 2.7* 2.9* 4.0 3.6  CL 96* 100 99 95*  CO2 27 26 24 27   GLUCOSE 96 90 89 86  BUN 17 20 30* 21*  CREATININE 4.00* 5.03* 6.83* 4.72*  CALCIUM 8.7* 8.4* 8.5* 7.9*  MG 1.8  --  1.9  --   PHOS  --   --  1.4*  --    Liver Function Tests: Recent Labs  Lab 12/07/23 1722 12/08/23 0536 12/09/23 0429 12/10/23 0434  AST 97* 114* 88* 67*  ALT 56* 65* 59* 48*  ALKPHOS 58 48 50 49  BILITOT 0.5 0.6 0.7 0.5  PROT 6.7 6.2* 6.0* 5.9*  ALBUMIN  3.5 3.1* 3.0* 2.8*   No results for input(s): LIPASE, AMYLASE in the last 168 hours. No results for input(s): AMMONIA in the last 168 hours. CBC: Recent Labs  Lab 12/07/23 1722 12/08/23 0001 12/08/23 0536 12/09/23 0429 12/10/23 0434  WBC 4.7  --  6.9 6.0 5.0  NEUTROABS  --   --   --  4.5 3.6  HGB 7.0* 8.0* 8.3* 8.6* 8.4*  HCT 20.5* 23.3* 23.9* 25.7* 24.2*  MCV 80.4  --  79.4* 79.6* 78.6*  PLT 189  --  175 153 171   Cardiac Enzymes: No  results for input(s): CKTOTAL, CKMB, CKMBINDEX, TROPONINI in the last 168 hours.  BNP: Invalid input(s): POCBNP CBG: No results for input(s): GLUCAP in the last 168 hours. D-Dimer No results for input(s): DDIMER in the last 72 hours. Hgb A1c No results for input(s): HGBA1C in the last 72 hours. Lipid Profile No results for input(s): CHOL, HDL, LDLCALC, TRIG, CHOLHDL, LDLDIRECT in the last 72 hours. Thyroid  function studies No results for input(s): TSH, T4TOTAL, T3FREE, THYROIDAB in the last 72 hours.  Invalid input(s): FREET3 Anemia work up Recent Labs    12/09/23 0429 12/10/23 0434  FERRITIN 689* 601*   Urinalysis    Component Value Date/Time   COLORURINE YELLOW 02/09/2008 1437   APPEARANCEUR TURBID (A) 02/09/2008 1437   LABSPEC 1.025 02/09/2008 1437   PHURINE 6.0 02/09/2008 1437   GLUCOSEU NEGATIVE 02/09/2008 1437   HGBUR LARGE (A) 02/09/2008 1437   BILIRUBINUR NEGATIVE 02/09/2008 1437   KETONESUR 15 (A) 02/09/2008 1437   PROTEINUR >300 (A) 02/09/2008 1437   UROBILINOGEN 0.2 02/09/2008 1437   NITRITE NEGATIVE 02/09/2008 1437   LEUKOCYTESUR LARGE (A) 02/09/2008 1437   Sepsis Labs Recent Labs  Lab 12/07/23 1722 12/08/23 0536 12/09/23 0429 12/10/23 0434  WBC 4.7 6.9 6.0 5.0   Microbiology Recent Results (from the past 240 hours)  Resp panel by RT-PCR (RSV, Flu A&B, Covid) Anterior Nasal Swab     Status: Abnormal   Collection Time: 12/07/23  8:16 PM   Specimen: Anterior Nasal Swab  Result Value Ref Range Status   SARS Coronavirus 2 by RT PCR POSITIVE (A) NEGATIVE Final    Comment: (NOTE) SARS-CoV-2 target nucleic acids are DETECTED.  The SARS-CoV-2 RNA is generally detectable in upper respiratory specimens during the acute phase of infection. Positive results are indicative of the presence of the identified virus, but do not rule out bacterial infection or co-infection with other pathogens not detected by the test.  Clinical correlation with patient history and other diagnostic information is necessary to determine patient infection status. The expected result is Negative.  Fact Sheet for Patients: BloggerCourse.com  Fact Sheet for Healthcare Providers: SeriousBroker.it  This test is not yet approved or cleared by the [place]  FDA and  has been authorized for detection and/or diagnosis of SARS-CoV-2 by FDA under an Emergency Use Authorization (EUA).  This EUA will remain in effect (meaning this test can be used) for the duration of  the COVID-19 declaration under Section 564(b)(1) of the A ct, 21 U.S.C. section 360bbb-3(b)(1), unless the authorization is terminated or revoked sooner.     Influenza A by PCR NEGATIVE NEGATIVE Final   Influenza B by PCR NEGATIVE NEGATIVE Final    Comment: (NOTE) The Xpert Xpress SARS-CoV-2/FLU/RSV plus assay is intended as an aid in the diagnosis of influenza from Nasopharyngeal swab specimens and should not be used as a sole basis for treatment. Nasal washings and aspirates are unacceptable for Xpert Xpress SARS-CoV-2/FLU/RSV testing.  Fact Sheet for Patients: BloggerCourse.com  Fact Sheet for Healthcare Providers: SeriousBroker.it  This test is not yet approved or cleared by the [place]  FDA and has been authorized for detection and/or diagnosis of SARS-CoV-2 by FDA under an Emergency Use Authorization (EUA). This EUA will remain in effect (meaning this test can be used) for the duration of the COVID-19 declaration under Section 564(b)(1) of the Act, 21 U.S.C. section 360bbb-3(b)(1), unless the authorization is terminated or revoked.     Resp Syncytial Virus by PCR NEGATIVE NEGATIVE Final    Comment: (NOTE) Fact Sheet for Patients: BloggerCourse.com  Fact Sheet for Healthcare  Providers:  SeriousBroker.it  This test is not yet approved or cleared by the [place]  FDA and has been authorized for detection and/or diagnosis of SARS-CoV-2 by FDA under an Emergency Use Authorization (EUA). This EUA will remain in effect (meaning this test can be used) for the duration of the COVID-19 declaration under Section 564(b)(1) of the Act, 21 U.S.C. section 360bbb-3(b)(1), unless the authorization is terminated or revoked.  Performed at [facility], [address] [place], [place] [address]     Time coordinating discharge: 33 mins  SIGNED:  Afton Louder, MD  [facility] 12/10/2023, 11:53 AM How to contact the [facility] Attending or Consulting provider 7A - 7P or covering provider during after hours 7P -7A, for this patient?  Check the care team in [facility] and look for a) attending/consulting [facility] provider listed and b) the [facility] team listed Log into [email or web address] and use [place]'s universal password to access. If you do not have the password, please contact the hospital operator. Locate the [facility] provider you are looking for under [facility] and page to a number that you can be directly reached. If you still have difficulty reaching the provider, please page the [facility] (Director on Call) for the Hospitalists listed on amion for assistance.

## 2023-12-23 ENCOUNTER — Emergency Department (HOSPITAL_COMMUNITY)

## 2023-12-23 ENCOUNTER — Other Ambulatory Visit: Payer: Self-pay

## 2023-12-23 ENCOUNTER — Inpatient Hospital Stay (HOSPITAL_COMMUNITY)
Admission: EM | Admit: 2023-12-23 | Discharge: 2023-12-26 | DRG: 377 | Disposition: A | Discharge status: Skilled Nursing Facility | Attending: Internal Medicine | Admitting: Internal Medicine

## 2023-12-23 ENCOUNTER — Encounter (HOSPITAL_COMMUNITY): Payer: Self-pay | Admitting: *Deleted

## 2023-12-23 DIAGNOSIS — I120 Hypertensive chronic kidney disease with stage 5 chronic kidney disease or end stage renal disease: Secondary | ICD-10-CM | POA: Diagnosis present

## 2023-12-23 DIAGNOSIS — M3210 Systemic lupus erythematosus, organ or system involvement unspecified: Secondary | ICD-10-CM | POA: Diagnosis present

## 2023-12-23 DIAGNOSIS — D649 Anemia, unspecified: Principal | ICD-10-CM | POA: Diagnosis present

## 2023-12-23 DIAGNOSIS — Z992 Dependence on renal dialysis: Secondary | ICD-10-CM

## 2023-12-23 DIAGNOSIS — N2581 Secondary hyperparathyroidism of renal origin: Secondary | ICD-10-CM | POA: Diagnosis present

## 2023-12-23 DIAGNOSIS — E876 Hypokalemia: Secondary | ICD-10-CM | POA: Diagnosis not present

## 2023-12-23 DIAGNOSIS — Z8616 Personal history of COVID-19: Secondary | ICD-10-CM

## 2023-12-23 DIAGNOSIS — N186 End stage renal disease: Secondary | ICD-10-CM | POA: Diagnosis present

## 2023-12-23 DIAGNOSIS — D631 Anemia in chronic kidney disease: Secondary | ICD-10-CM | POA: Diagnosis present

## 2023-12-23 DIAGNOSIS — F418 Other specified anxiety disorders: Secondary | ICD-10-CM | POA: Diagnosis present

## 2023-12-23 DIAGNOSIS — K5521 Angiodysplasia of colon with hemorrhage: Principal | ICD-10-CM | POA: Diagnosis present

## 2023-12-23 DIAGNOSIS — G40909 Epilepsy, unspecified, not intractable, without status epilepticus: Secondary | ICD-10-CM | POA: Diagnosis present

## 2023-12-23 DIAGNOSIS — Z7902 Long term (current) use of antithrombotics/antiplatelets: Secondary | ICD-10-CM

## 2023-12-23 DIAGNOSIS — Z8269 Family history of other diseases of the musculoskeletal system and connective tissue: Secondary | ICD-10-CM

## 2023-12-23 DIAGNOSIS — Z79899 Other long term (current) drug therapy: Secondary | ICD-10-CM

## 2023-12-23 DIAGNOSIS — F32A Depression, unspecified: Secondary | ICD-10-CM | POA: Diagnosis present

## 2023-12-23 DIAGNOSIS — D62 Acute posthemorrhagic anemia: Secondary | ICD-10-CM | POA: Diagnosis present

## 2023-12-23 DIAGNOSIS — K648 Other hemorrhoids: Secondary | ICD-10-CM | POA: Diagnosis present

## 2023-12-23 DIAGNOSIS — F17210 Nicotine dependence, cigarettes, uncomplicated: Secondary | ICD-10-CM | POA: Diagnosis present

## 2023-12-23 DIAGNOSIS — I10 Essential (primary) hypertension: Secondary | ICD-10-CM | POA: Diagnosis present

## 2023-12-23 DIAGNOSIS — F71 Moderate intellectual disabilities: Secondary | ICD-10-CM | POA: Diagnosis present

## 2023-12-23 DIAGNOSIS — F419 Anxiety disorder, unspecified: Secondary | ICD-10-CM | POA: Diagnosis present

## 2023-12-23 DIAGNOSIS — N189 Chronic kidney disease, unspecified: Secondary | ICD-10-CM

## 2023-12-23 LAB — CBC WITH DIFFERENTIAL/PLATELET
Abs Immature Granulocytes: 0.02 K/uL (ref 0.00–0.07)
Basophils Absolute: 0.1 K/uL (ref 0.0–0.1)
Basophils Relative: 1 %
Eosinophils Absolute: 0.1 K/uL (ref 0.0–0.5)
Eosinophils Relative: 2 %
HCT: 16.7 % — ABNORMAL LOW (ref 36.0–46.0)
Hemoglobin: 5.3 g/dL — CL (ref 12.0–15.0)
Immature Granulocytes: 0 %
Lymphocytes Relative: 25 %
Lymphs Abs: 1.7 K/uL (ref 0.7–4.0)
MCH: 27.2 pg (ref 26.0–34.0)
MCHC: 31.7 g/dL (ref 30.0–36.0)
MCV: 85.6 fL (ref 80.0–100.0)
Monocytes Absolute: 0.6 K/uL (ref 0.1–1.0)
Monocytes Relative: 8 %
Neutro Abs: 4.4 K/uL (ref 1.7–7.7)
Neutrophils Relative %: 64 %
Platelets: 305 K/uL (ref 150–400)
RBC: 1.95 MIL/uL — ABNORMAL LOW (ref 3.87–5.11)
RDW: 20.7 % — ABNORMAL HIGH (ref 11.5–15.5)
WBC: 6.9 K/uL (ref 4.0–10.5)
nRBC: 0 % (ref 0.0–0.2)

## 2023-12-23 LAB — COMPREHENSIVE METABOLIC PANEL WITH GFR
ALT: 25 U/L (ref 0–44)
AST: 35 U/L (ref 15–41)
Albumin: 3.9 g/dL (ref 3.5–5.0)
Alkaline Phosphatase: 67 U/L (ref 38–126)
Anion gap: 10 (ref 5–15)
BUN: 10 mg/dL (ref 6–20)
CO2: 31 mmol/L (ref 22–32)
Calcium: 8.7 mg/dL — ABNORMAL LOW (ref 8.9–10.3)
Chloride: 99 mmol/L (ref 98–111)
Creatinine, Ser: 2.62 mg/dL — ABNORMAL HIGH (ref 0.44–1.00)
GFR, Estimated: 22 mL/min — ABNORMAL LOW (ref >60)
Glucose, Bld: 84 mg/dL (ref 70–99)
Potassium: 3.2 mmol/L — ABNORMAL LOW (ref 3.5–5.1)
Sodium: 141 mmol/L (ref 135–145)
Total Bilirubin: <0.2 mg/dL (ref 0.0–1.2)
Total Protein: 6.2 g/dL — ABNORMAL LOW (ref 6.5–8.1)

## 2023-12-23 LAB — VITAMIN B12: Vitamin B-12: 1056 pg/mL — ABNORMAL HIGH (ref 180–914)

## 2023-12-23 LAB — RETICULOCYTES
Immature Retic Fract: 39.6 % — ABNORMAL HIGH (ref 2.3–15.9)
RBC.: 1.96 MIL/uL — ABNORMAL LOW (ref 3.87–5.11)
Retic Count, Absolute: 102.9 K/uL (ref 19.0–186.0)
Retic Ct Pct: 5.3 % — ABNORMAL HIGH (ref 0.4–3.1)

## 2023-12-23 LAB — IRON AND TIBC
Iron: 82 ug/dL (ref 28–170)
Saturation Ratios: 40 % — ABNORMAL HIGH (ref 10.4–31.8)
TIBC: 203 ug/dL — ABNORMAL LOW (ref 250–450)
UIBC: 121 ug/dL

## 2023-12-23 LAB — HEMOGLOBIN AND HEMATOCRIT, BLOOD
HCT: 27.6 % — ABNORMAL LOW (ref 36.0–46.0)
Hemoglobin: 9.4 g/dL — ABNORMAL LOW (ref 12.0–15.0)

## 2023-12-23 LAB — PREPARE RBC (CROSSMATCH)

## 2023-12-23 LAB — FERRITIN: Ferritin: 763 ng/mL — ABNORMAL HIGH (ref 11–307)

## 2023-12-23 LAB — MAGNESIUM: Magnesium: 1.8 mg/dL (ref 1.7–2.4)

## 2023-12-23 LAB — FOLATE: Folate: 18.6 ng/mL (ref >5.9)

## 2023-12-23 LAB — PHOSPHORUS: Phosphorus: 1.7 mg/dL — ABNORMAL LOW (ref 2.5–4.6)

## 2023-12-23 MED ORDER — GUAIFENESIN 100 MG/5ML PO LIQD
200.0000 mg | Freq: Four times a day (QID) | ORAL | Status: DC | PRN
Start: 2023-12-23 — End: 2023-12-26

## 2023-12-23 MED ORDER — ADULT MULTIVITAMIN W/MINERALS CH
1.0000 | ORAL_TABLET | Freq: Every day | ORAL | Status: DC
  Administered 2023-12-23 – 2023-12-26 (×4): 1 via ORAL
  Filled 2023-12-23 (×4): qty 1

## 2023-12-23 MED ORDER — ONDANSETRON HCL 4 MG PO TABS: 4.0000 mg | ORAL_TABLET | Freq: Four times a day (QID) | ORAL | Status: DC | PRN

## 2023-12-23 MED ORDER — POTASSIUM CHLORIDE CRYS ER 20 MEQ PO TBCR
40.0000 meq | EXTENDED_RELEASE_TABLET | Freq: Once | ORAL | Status: AC
  Administered 2023-12-23: 40 meq via ORAL
  Filled 2023-12-23: qty 2

## 2023-12-23 MED ORDER — ACETAMINOPHEN 325 MG PO TABS: 650.0000 mg | ORAL_TABLET | Freq: Four times a day (QID) | ORAL | Status: DC | PRN

## 2023-12-23 MED ORDER — PEG 3350-KCL-NA BICARB-NACL 420 G PO SOLR
4000.0000 mL | Freq: Once | ORAL | Status: AC
  Administered 2023-12-23: 4000 mL via ORAL

## 2023-12-23 MED ORDER — ONDANSETRON HCL 4 MG/2ML IJ SOLN: 4.0000 mg | Freq: Four times a day (QID) | INTRAMUSCULAR | Status: DC | PRN

## 2023-12-23 MED ORDER — SERTRALINE HCL 50 MG PO TABS
200.0000 mg | ORAL_TABLET | Freq: Every day | ORAL | Status: DC
Start: 2023-12-24 — End: 2023-12-26
  Administered 2023-12-24 – 2023-12-26 (×3): 200 mg via ORAL
  Filled 2023-12-23 (×4): qty 4

## 2023-12-23 MED ORDER — SIMVASTATIN 20 MG PO TABS
40.0000 mg | ORAL_TABLET | Freq: Every day | ORAL | Status: DC
  Administered 2023-12-23 – 2023-12-25 (×3): 40 mg via ORAL
  Filled 2023-12-23 (×3): qty 2

## 2023-12-23 MED ORDER — MIDODRINE HCL 5 MG PO TABS
10.0000 mg | ORAL_TABLET | Freq: Two times a day (BID) | ORAL | Status: DC
  Administered 2023-12-23 – 2023-12-26 (×6): 10 mg via ORAL
  Filled 2023-12-23 (×6): qty 2

## 2023-12-23 MED ORDER — CALCIUM CARBONATE ANTACID 500 MG PO CHEW
1000.0000 mg | CHEWABLE_TABLET | Freq: Two times a day (BID) | ORAL | Status: DC
  Administered 2023-12-24 – 2023-12-26 (×4): 400 mg via ORAL
  Filled 2023-12-23 (×4): qty 2

## 2023-12-23 MED ORDER — IPRATROPIUM BROMIDE 0.02 % IN SOLN
0.5000 mg | Freq: Four times a day (QID) | RESPIRATORY_TRACT | Status: DC | PRN
Start: 2023-12-23 — End: 2023-12-26

## 2023-12-23 MED ORDER — K PHOS MONO-SOD PHOS DI & MONO 155-852-130 MG PO TABS
500.0000 mg | ORAL_TABLET | Freq: Every day | ORAL | Status: AC
  Administered 2023-12-24: 500 mg via ORAL
  Filled 2023-12-23: qty 2

## 2023-12-23 MED ORDER — SODIUM CHLORIDE 0.9% IV SOLUTION: Freq: Once | INTRAVENOUS | Status: AC

## 2023-12-23 MED ORDER — HYDROMORPHONE HCL 1 MG/ML IJ SOLN: 0.5000 mg | INTRAMUSCULAR | Status: DC | PRN

## 2023-12-23 MED ORDER — ACETAMINOPHEN 650 MG RE SUPP: 650.0000 mg | Freq: Four times a day (QID) | RECTAL | Status: DC | PRN

## 2023-12-23 MED ORDER — ALBUTEROL SULFATE (2.5 MG/3ML) 0.083% IN NEBU: 2.5000 mg | INHALATION_SOLUTION | Freq: Four times a day (QID) | RESPIRATORY_TRACT | Status: DC | PRN

## 2023-12-23 MED ORDER — ALPRAZOLAM 0.25 MG PO TABS
0.2500 mg | ORAL_TABLET | Freq: Two times a day (BID) | ORAL | Status: DC
  Administered 2023-12-23 – 2023-12-26 (×6): 0.25 mg via ORAL
  Filled 2023-12-23 (×6): qty 1

## 2023-12-23 MED ORDER — SODIUM CHLORIDE 0.9 % IV SOLN: INTRAVENOUS | Status: AC

## 2023-12-23 MED ORDER — PHENYTOIN SODIUM EXTENDED 100 MG PO CAPS
100.0000 mg | ORAL_CAPSULE | Freq: Two times a day (BID) | ORAL | Status: DC
  Administered 2023-12-23 – 2023-12-26 (×6): 100 mg via ORAL
  Filled 2023-12-23 (×6): qty 1

## 2023-12-23 MED ORDER — PANTOPRAZOLE SODIUM 40 MG IV SOLR
40.0000 mg | INTRAVENOUS | Status: DC
  Administered 2023-12-23 – 2023-12-26 (×4): 40 mg via INTRAVENOUS
  Filled 2023-12-23 (×4): qty 10

## 2023-12-23 MED ORDER — MIRTAZAPINE 15 MG PO TABS
7.5000 mg | ORAL_TABLET | Freq: Every day | ORAL | Status: DC
  Administered 2023-12-23 – 2023-12-25 (×3): 7.5 mg via ORAL
  Filled 2023-12-23 (×3): qty 1

## 2023-12-23 NOTE — ED Provider Notes (Signed)
 [place] EMERGENCY DEPARTMENT AT [facility] Provider Note   CSN: [id number] Arrival date & time: 12/23/23  9042     Patient presents with: low hemoglobin   [name] is a 48 y.o. female.   Patient is a 48 year old female who presents to the emergency department with a chief complaint of low hemoglobin.  She was at dialysis this morning and notes that her hemoglobin was 5.9.  She was sent to the emergency department for further evaluation.  She did not receive dialysis today.  She denies any current chest pain, shortness of breath, abdominal pain.  She notes that she has not noticed any melena or hematochezia but was recently discharged from the hospital secondary to GI bleed.  During that time endoscopy was performed which was unremarkable.  Patient denies any associated increased edema.        Prior to Admission medications   Medication Sig Start Date End Date Taking? Authorizing Provider  albuterol  (PROVENTIL ) (2.5 MG/3ML) 0.083% nebulizer solution Take 2.5 mg by nebulization 4 (four) times daily as needed for wheezing or shortness of breath.     [provider]  ALPRAZolam  (XANAX ) 0.25 MG tablet Take 0.25 mg by mouth 2 (two) times daily.    [provider]  ammonium lactate  (AMLACTIN DAILY) 12 % lotion Apply 1 Application topically as needed for dry skin. 04/23/23   [name], DPM  Calcium Carbonate Antacid (TUMS PO) Take 1,000 mg by mouth 2 (two) times daily between meals. 06/13/22   [provider]  chlorhexidine  (PERIDEX ) 0.12 % solution Use as directed 15 mLs in the mouth or throat 2 (two) times daily. After regular toothbrushing, rinse mouth 08/20/22   [provider]  clopidogrel  (PLAVIX ) 75 MG tablet Take 75 mg by mouth daily.    [provider]  cycloSPORINE (RESTASIS) 0.05 % ophthalmic emulsion Place 1 drop into both eyes 2 (two) times daily. 01/31/21   [provider]  docusate sodium  (COLACE)  100 MG capsule Take 100 mg by mouth daily.    [provider]  epoetin  alfa (EPOGEN ,PROCRIT) 4000 UNIT/ML injection Inject 4,000 Units into the vein See admin instructions. Mon, Wed, and Friday at dialysis    [provider]  guaiFENesin  (ROBITUSSIN) 100 MG/5ML liquid Take 200 mg by mouth every 6 (six) hours as needed for cough.     [provider]  hydroxychloroquine  (PLAQUENIL ) 200 MG tablet Take 200 mg by mouth daily.    [provider]  ipratropium (ATROVENT ) 0.02 % nebulizer solution Take 0.5 mg by nebulization 4 (four) times daily as needed for wheezing or shortness of breath (For Breathing).    [provider]  lidocaine -prilocaine  (EMLA ) cream Apply 1 application topically as needed (port access).  12/20/12   [provider]  loratadine (CLARITIN) 10 MG tablet Take 10 mg by mouth daily as needed for allergies.    [provider]  midodrine  (PROAMATINE ) 10 MG tablet Take 10 mg by mouth 2 (two) times daily.     [provider]  mirtazapine  (REMERON ) 15 MG tablet Take 7.5 mg by mouth at bedtime.  11/03/13   [provider]  multivitamin (RENA-VIT) TABS tablet Take 1 tablet by mouth daily.    [provider]  pantoprazole  (PROTONIX ) 40 MG tablet Take 40 mg by mouth daily.    [provider]  phenytoin  (DILANTIN ) 100 MG ER capsule Take 100 mg by mouth 2 (two) times daily.  [provider]  sertraline  (ZOLOFT ) 100 MG tablet Take 200 mg by mouth daily.     [provider]  simvastatin  (ZOCOR ) 40 MG tablet Take 40 mg by mouth at bedtime.    [provider]    Allergies: Dust mite extract    Review of Systems  All other systems reviewed and are negative.   Updated Vital Signs BP 122/86 (BP Location: Left Arm)   Pulse 81   Temp 98.1 F (36.7 C) (Oral)   Resp 14   Ht 5' 3 (1.6 m)   Wt 64.9 kg   LMP 10/28/2023 (Approximate)   SpO2 96%   BMI 25.33 kg/m    Physical Exam Vitals and nursing note reviewed.  Constitutional:      General: She is not in acute distress.    Appearance: Normal appearance. She is not ill-appearing.  HENT:     Head: Normocephalic and atraumatic.     Nose: Nose normal.     Mouth/Throat:     Mouth: Mucous membranes are moist.  Eyes:     Extraocular Movements: Extraocular movements intact.     Conjunctiva/sclera: Conjunctivae normal.     Pupils: Pupils are equal, round, and reactive to light.  Cardiovascular:     Rate and Rhythm: Normal rate and regular rhythm.     Pulses: Normal pulses.     Heart sounds: Normal heart sounds. No murmur heard.    No gallop.  Pulmonary:     Effort: Pulmonary effort is normal. No respiratory distress.     Breath sounds: Normal breath sounds. No stridor. No wheezing, rhonchi or rales.  Abdominal:     General: Abdomen is flat. Bowel sounds are normal. There is no distension.     Palpations: Abdomen is soft.     Tenderness: There is no abdominal tenderness. There is no guarding.  Musculoskeletal:        General: Normal range of motion.     Cervical back: Normal range of motion and neck supple. No rigidity or tenderness.  Skin:    General: Skin is warm and dry.     Findings: No bruising or rash.  Neurological:     General: No focal deficit present.     Mental Status: She is alert and oriented to person, place, and time. Mental status is at baseline.     Cranial Nerves: No cranial nerve deficit.     Sensory: No sensory deficit.     Motor: No weakness.     Coordination: Coordination normal.     Gait: Gait normal.  Psychiatric:        Mood and Affect: Mood normal.        Behavior: Behavior normal.        Thought Content: Thought content normal.        Judgment: Judgment normal.     (all labs ordered are listed, but only abnormal results are displayed) Labs Reviewed  COMPREHENSIVE METABOLIC PANEL WITH GFR  CBC WITH DIFFERENTIAL/PLATELET  MAGNESIUM  PHOSPHORUS  TYPE AND  SCREEN    EKG: None  Radiology: No results found.   .Critical Care  Performed by: Daralene Lonni BIRCH, PA-C Authorized by: Daralene Lonni BIRCH, PA-C   Critical care provider statement:    Critical care time (minutes):  35   Critical care was necessary to treat or prevent imminent or life-threatening deterioration of the following conditions: Anemia, requiring transfusion.   Critical care was time spent personally by me on the following activities:  Development of treatment plan with patient or surrogate, discussions with consultants, evaluation of patient's response to treatment, examination of patient, ordering and review of laboratory studies, ordering and review of radiographic studies, ordering and performing treatments and interventions, pulse oximetry, re-evaluation of patient's condition and review of old charts   I assumed direction of critical care for this patient from another provider in my specialty: no     Care discussed with: admitting provider      Medications Ordered in the ED - No data to display  Clinical Course as of 12/23/23 1155  Wed Dec 23, 2023  1030 Patient has refused rectal exam and Hemoccult testing at this time. [CR]    Clinical Course User Index [CR] Daralene Lonni BIRCH, PA-C                                 Medical Decision Making Amount and/or Complexity of Data Reviewed Labs: ordered. Radiology: ordered.  Risk Prescription drug management. Decision regarding hospitalization.   This patient presents to the ED for concern of anemia, this involves an extensive number of treatment options, and is a complaint that carries with it a high risk of complications and morbidity.  The differential diagnosis includes upper GI bleed, lower GI bleed, anemia of chronic disease, electrolyte derangement, acute kidney injury   Co morbidities that complicate the patient evaluation  Chronic kidney disease, dialysis dependent   Additional history  obtained:  Additional history obtained from medical records External records from outside source obtained and reviewed including medical records   Lab Tests:  I Ordered, and personally interpreted labs.  The pertinent results include: No leukocytosis, worsening anemia, normal platelet count, creatinine at baseline, mild hypokalemia, normal magnesium, normal liver function, low phosphorus   Imaging Studies ordered:  I ordered imaging studies including chest x-ray I independently visualized and interpreted imaging which showed increased right-sided pleural effusion I agree with the radiologist interpretation   Cardiac Monitoring: / EKG:  The patient was maintained on a cardiac monitor.  I personally viewed and interpreted the cardiac monitored which showed an underlying rhythm of: Normal sinus rhythm, no ST/T wave changes, no ischemic changes, no STEMI   Consultations Obtained:  I requested consultation with the gastroenterology, Dr. Eartha, nephrology, Dr. Rayburn,  and discussed lab and imaging findings as well as pertinent plan - they recommend: Will see patient in consult, admission to the hospital service, transfusion   Problem List / ED Course / Critical interventions / Medication management  Patient is doing well at this time and does remain stable.  Discussed with patient we will plan for admission to the hospital service.  Did discuss the patient case with gastroenterology who will see the patient in consult and plan for possible colonoscopy.  Did discuss the patient case with the on-call nephrologist who will evaluate the patient for dialysis.  Patient has mild hypokalemia at this point and potassium was repleted in the emergency department.  Patient has a normal magnesium.  Vital signs are stable at this time with no tachycardia and no hypotension.  Have discussed the patient case with Dr. Sonjia with the hospital service who has excepted for admission. I ordered  medication including packed red blood cells, potassium for anemia and hypokalemia Reevaluation of the patient after these medicines showed that the patient improved I have reviewed the patients home medicines and have made adjustments as needed   Social Determinants of Health:  None   Test / Admission - Considered:  Admission     Final diagnoses:  None    ED Discharge Orders     None          Daralene Lonni JONETTA DEVONNA 12/23/23 1158    Suzette Pac, MD 12/24/23 1103

## 2023-12-23 NOTE — Consult Note (Signed)
 [facility] Renal Consultation Note    Indication for Consultation:  Management of ESRD/hemodialysis; anemia, hypertension/volume and secondary hyperparathyroidism  HPI: [name] is a 48 y.o. female with ESRD on HD, HTN, SLE, DVT, seizure, mod intellectual disabilities, depression currently admitted for ABLA due to GIB and nephrology is consulted for co management of ESRD and associated conditions.  She was admitted with similar presentation 9/22-9/25/25.  Please see consult note dated 12/08/23 for further details.  Pt was sent from her HD center via EMS due to low hgb level of 5.9 at the outpatient center.  In the ED, Temp 98.1, Bp 122/86, HR 81, SpO2 96%.  Labs notable for Hgb 5.3, K 3.2, BUN 10, Cr 2.62.  She denies any hematochezia, melena, or BRBPR.  She did complete 3 hours of her 3:30 treatment today.  Past Medical History:  Diagnosis Date   Anemia    BV (bacterial vaginosis) 11/08/2013   Depression    Dialysis patient    DVT (deep venous thrombosis) (HCC)    Hypertension    Lupus    Renal disorder    Vaginal odor 11/08/2013   Past Surgical History:  Procedure Laterality Date   A/V SHUNT INTERVENTION N/A 10/24/2016   Procedure: A/V SHUNT INTERVENTION;  Surgeon: Jama Cordella MATSU, MD;  Location: [facility];  Service: Cardiovascular;  Laterality: N/A;   A/V SHUNT INTERVENTION Left 09/15/2023   Procedure: A/V SHUNT INTERVENTION;  Surgeon: Lanis Fonda BRAVO, MD;  Location: [facility];  Service: Cardiovascular;  Laterality: Left;   A/V SHUNTOGRAM Left 12/08/2017   Procedure: A/V SHUNTOGRAM;  Surgeon: Jama Cordella MATSU, MD;  Location: [facility];  Service: Cardiovascular;  Laterality: Left;   A/V SHUNTOGRAM Left 01/28/2018   Procedure: A/V SHUNTOGRAM;  Surgeon: [name], MD;  Location: [facility];  Service: Cardiovascular;  Laterality: Left;   A/V SHUNTOGRAM Left 04/01/2018   Procedure: A/V SHUNTOGRAM;  Surgeon: [name],  MD;  Location: [facility];  Service: Cardiovascular;  Laterality: Left;   A/V SHUNTOGRAM Left 04/28/2019   Procedure: A/V SHUNTOGRAM;  Surgeon: [name], MD;  Location: [facility];  Service: Cardiovascular;  Laterality: Left;   AV FISTULA PLACEMENT     AV FISTULA PLACEMENT Left 02/09/2022   Procedure: FISTULAGRAM, placement of left arm stent.;  Surgeon: [name], MD;  Location: [facility] OR;  Service: Vascular;  Laterality: Left;   AV FISTULA PLACEMENT Right 09/02/2022   Procedure: EXPLORATION OF RIGHT AXILLA, INADEQUATE VEIN ACCESS;  Surgeon: Oris Krystal FALCON, MD;  Location: [facility];  Service: Vascular;  Laterality: Right;   AV FISTULA PLACEMENT Left 09/25/2022   Procedure: INSERTION OF LEFT FEMORAL LOOP GRAFT USING 4-57mm GORETEX STRETCH GRAFT;  Surgeon: [name], MD;  Location: [facility];  Service: Vascular;  Laterality: Left;   DIALYSIS/PERMA CATHETER INSERTION  10/23/2016   Procedure: DIALYSIS/PERMA CATHETER INSERTION;  Surgeon: [name], MD;  Location: [facility];  Service: Cardiovascular;;   ESOPHAGOGASTRODUODENOSCOPY N/A 12/08/2023   Procedure: EGD (ESOPHAGOGASTRODUODENOSCOPY);  Surgeon: Eartha Flavors, Toribio, MD;  Location: [facility];  Service: Gastroenterology;  Laterality: N/A;   IR AV DIALY SHUNT INTRO NEEDLE/INTRACATH INITIAL W/PTA/IMG LEFT  05/12/2023   IR REMOVAL TUN CV CATH W/O FL  09/22/2017   IR REMOVAL TUN CV CATH W/O FL  01/06/2023   IR THROMBECTOMY AV FISTULA W/THROMBOLYSIS INC/SHUNT/IMG LEFT Left 09/04/2017   IR US  GUIDE VASC ACCESS  LEFT  09/04/2017   IR US  GUIDE VASC ACCESS LEFT  05/12/2023   MASS EXCISION Left 06/01/2018   Procedure: EXCISION 5 X 3CM LIPOMA LEFT THIGH;  Surgeon: [name], MD;  Location: [facility];  Service: General;  Laterality: Left;   PERIPHERAL VASCULAR CATHETERIZATION Left 01/04/2015   Procedure: A/V Shuntogram/Fistulagram;  Surgeon: Selinda GORMAN Gu, MD;  Location: [facility];  Service: Cardiovascular;   Laterality: Left;   PERIPHERAL VASCULAR CATHETERIZATION N/A 01/04/2015   Procedure: A/V Shunt Intervention;  Surgeon: Selinda GORMAN Gu, MD;  Location: [facility];  Service: Cardiovascular;  Laterality: N/A;   PERIPHERAL VASCULAR CATHETERIZATION Left 04/13/2015   Procedure: A/V Shuntogram/Fistulagram;  Surgeon: [name], MD;  Location: [facility];  Service: Cardiovascular;  Laterality: Left;   PERIPHERAL VASCULAR CATHETERIZATION Left 04/13/2015   Procedure: A/V Shunt Intervention;  Surgeon: [name], MD;  Location: [facility];  Service: Cardiovascular;  Laterality: Left;   REMOVAL OF A DIALYSIS CATHETER Left 09/25/2022   Procedure: REMOVAL OF LEFT INTERNAL JUGULAR HERO CATHETER;  Surgeon: [name], MD;  Location: [facility];  Service: Vascular;  Laterality: Left;   UPPER EXTREMITY VENOGRAPHY N/A 08/26/2022   Procedure: UPPER EXTREMITY VENOGRAPHY;  Surgeon: [name], MD;  Location: [facility];  Service: Cardiovascular;  Laterality: N/A;   VENOUS ANGIOPLASTY  09/15/2023   Procedure: VENOUS ANGIOPLASTY;  Surgeon: Lanis Fonda BRAVO, MD;  Location: [facility];  Service: Cardiovascular;;  leg loop graft   Family History:   Family History  Problem Relation Age of Onset   Seizures Son    Kidney disease Mother        on dialysis   Lupus Mother    Social History:  reports that she has been smoking cigarettes. She has a 1.5 pack-year smoking history. She has been exposed to tobacco smoke. She has never used smokeless tobacco. She reports that she does not drink alcohol  and does not use drugs. Allergies  Allergen Reactions   Dust Mite Extract Other (See Comments)    sneezing   Prior to Admission medications   Medication Sig Start Date End Date Taking? Authorizing Provider  albuterol  (PROVENTIL ) (2.5 MG/3ML) 0.083% nebulizer solution Take 2.5 mg by nebulization 4 (four) times daily as needed for wheezing or shortness of breath.     [provider]  ALPRAZolam  (XANAX ) 0.25 MG tablet Take 0.25 mg by mouth 2 (two) times daily.    [provider]  ammonium lactate  (AMLACTIN DAILY) 12 % lotion Apply 1 Application topically as needed for dry skin. 04/23/23   [name], DPM  Calcium Carbonate Antacid (TUMS PO) Take 1,000 mg by mouth 2 (two) times daily between meals. 06/13/22   [provider]  chlorhexidine  (PERIDEX ) 0.12 % solution Use as directed 15 mLs in the mouth or throat 2 (two) times daily. After regular toothbrushing, rinse mouth 08/20/22   [provider]  clopidogrel  (PLAVIX ) 75 MG tablet Take 75 mg by mouth daily.    [provider]  cycloSPORINE (RESTASIS) 0.05 % ophthalmic emulsion Place 1 drop into both eyes 2 (two) times daily. 01/31/21   [provider]  docusate sodium  (COLACE) 100 MG capsule Take 100 mg by mouth daily.    [provider]  epoetin  alfa (EPOGEN ,PROCRIT) 4000 UNIT/ML injection Inject 4,000 Units into the vein See admin instructions. Mon, Wed, and Friday at dialysis    [provider]  guaiFENesin  (  ROBITUSSIN) 100 MG/5ML liquid Take 200 mg by mouth every 6 (six) hours as needed for cough.     [provider]  hydroxychloroquine  (PLAQUENIL ) 200 MG tablet Take 200 mg by mouth daily.    [provider]  ipratropium (ATROVENT ) 0.02 % nebulizer solution Take 0.5 mg by nebulization 4 (four) times daily as needed for wheezing or shortness of breath (For Breathing).    [provider]  lidocaine -prilocaine  (EMLA ) cream Apply 1 application topically as needed (port access).  12/20/12   [provider]  loratadine (CLARITIN) 10 MG tablet Take 10 mg by mouth daily as needed for allergies.    [provider]  midodrine  (PROAMATINE ) 10 MG tablet Take 10 mg by mouth 2 (two) times daily.     [provider]  mirtazapine  (REMERON ) 15 MG tablet Take 7.5 mg by mouth at bedtime.  11/03/13   [provider]  multivitamin (RENA-VIT) TABS tablet Take 1 tablet by mouth daily.    [provider]  pantoprazole  (PROTONIX ) 40 MG tablet Take 40 mg by mouth daily.    [provider]  phenytoin  (DILANTIN ) 100 MG ER capsule Take 100 mg by mouth 2 (two) times daily.     [provider]  sertraline  (ZOLOFT ) 100 MG tablet Take 200 mg by mouth daily.     [provider]  simvastatin  (ZOCOR ) 40 MG tablet Take 40 mg by mouth at bedtime.    [provider]   Current Facility-Administered Medications  Medication Dose Route Frequency Provider Last Rate Last Admin   0.9 %  sodium chloride  infusion (Manually program via Guardrails IV Fluids)   Intravenous Once Rigney, Christopher D, PA-C       potassium chloride  SA (KLOR-CON  M) CR tablet 40 mEq  40 mEq Oral Once Rigney, Christopher D, PA-C       Current Outpatient Medications  Medication Sig Dispense Refill   albuterol  (PROVENTIL ) (2.5 MG/3ML) 0.083% nebulizer solution Take 2.5 mg by nebulization 4 (four) times daily as needed for wheezing or shortness of breath.      ALPRAZolam  (XANAX ) 0.25 MG tablet Take 0.25 mg by mouth 2 (two) times daily.     ammonium lactate  (AMLACTIN DAILY) 12 % lotion Apply 1 Application topically as needed for dry skin. 400 g 0   Calcium Carbonate Antacid (TUMS PO) Take 1,000 mg by mouth 2 (two) times daily between meals.     chlorhexidine  (PERIDEX ) 0.12 % solution Use as directed 15 mLs in the mouth or throat 2 (two) times daily. After regular toothbrushing, rinse mouth     clopidogrel  (PLAVIX ) 75 MG tablet Take 75 mg by mouth daily.     cycloSPORINE (RESTASIS) 0.05 % ophthalmic emulsion Place 1 drop into both eyes 2 (two) times daily.     docusate sodium  (COLACE) 100 MG capsule Take 100 mg by mouth daily.     epoetin  alfa (EPOGEN ,PROCRIT) 4000 UNIT/ML injection Inject 4,000 Units into the vein See admin instructions. Mon, Wed, and Friday at dialysis     guaiFENesin  (ROBITUSSIN) 100  MG/5ML liquid Take 200 mg by mouth every 6 (six) hours as needed for cough.      hydroxychloroquine  (PLAQUENIL ) 200 MG tablet Take 200 mg by mouth daily.     ipratropium (ATROVENT ) 0.02 % nebulizer solution Take 0.5 mg by nebulization 4 (four) times daily as needed for wheezing or shortness of breath (For Breathing).     lidocaine -prilocaine  (EMLA ) cream Apply 1 application topically as needed (  port access).      loratadine (CLARITIN) 10 MG tablet Take 10 mg by mouth daily as needed for allergies.     midodrine  (PROAMATINE ) 10 MG tablet Take 10 mg by mouth 2 (two) times daily.      mirtazapine  (REMERON ) 15 MG tablet Take 7.5 mg by mouth at bedtime.      multivitamin (RENA-VIT) TABS tablet Take 1 tablet by mouth daily.     pantoprazole  (PROTONIX ) 40 MG tablet Take 40 mg by mouth daily.     phenytoin  (DILANTIN ) 100 MG ER capsule Take 100 mg by mouth 2 (two) times daily.      sertraline  (ZOLOFT ) 100 MG tablet Take 200 mg by mouth daily.      simvastatin  (ZOCOR ) 40 MG tablet Take 40 mg by mouth at bedtime.     Labs: Basic Metabolic Panel: Recent Labs  Lab 12/23/23 1046 12/23/23 1047  NA 141  --   K 3.2*  --   CL 99  --   CO2 31  --   GLUCOSE 84  --   BUN 10  --   CREATININE 2.62*  --   CALCIUM 8.7*  --   PHOS  --  1.7*   Liver Function Tests: Recent Labs  Lab 12/23/23 1046  AST 35  ALT 25  ALKPHOS 67  BILITOT <0.2  PROT 6.2*  ALBUMIN  3.9   No results for input(s): LIPASE, AMYLASE in the last 168 hours. No results for input(s): AMMONIA in the last 168 hours. CBC: Recent Labs  Lab 12/23/23 1046  WBC 6.9  NEUTROABS 4.4  HGB 5.3*  HCT 16.7*  MCV 85.6  PLT 305   Cardiac Enzymes: No results for input(s): CKTOTAL, CKMB, CKMBINDEX, TROPONINI in the last 168 hours. CBG: No results for input(s): GLUCAP in the last 168 hours. Iron Studies: No results for input(s): IRON, TIBC, TRANSFERRIN, FERRITIN in the last 72 hours. Studies/Results: DG Chest Port  1 View Result Date: 12/23/2023 EXAM: 1 VIEW(S) XRAY OF THE CHEST 12/23/2023 10:40:00 AM COMPARISON: 12/10/2023 CLINICAL HISTORY: weakness. Pt BIB [facility] from dialysis for c/o low hemoglobin; dialysis states pt's hemoglobin is 5.9; pt was seen here last week for same complaint and received blood; pt is unsure how many; ; Pt denies any sob or pain. Hx of DVT, hypertension. Current everyday smoker FINDINGS: LUNGS AND PLEURA: Interval increase in right pleural effusion which is now moderate in volume compared to small. No focal pulmonary opacity. No pulmonary edema. No pneumothorax. HEART AND MEDIASTINUM: Vascular stents noted. No acute abnormality of the cardiac and mediastinal silhouettes. BONES AND SOFT TISSUES: No acute osseous abnormality. IMPRESSION: 1. Interval increase in right pleural effusion, now moderate in volume compared to small. 2. No focal pulmonary opacity, pulmonary edema, or pneumothorax. Electronically signed by: Norleen Boxer MD 12/23/2023 11:12 AM EDT RP Workstation: [id number]    ROS: Pertinent items noted in HPI and remainder of comprehensive ROS otherwise negative. Physical Exam: Vitals:   12/23/23 1018 12/23/23 1019  BP: 122/86   Pulse: 81   Resp: 14   Temp: 98.1 F (36.7 C)   TempSrc: Oral   SpO2: 96%   Weight:  64.9 kg  Height:  5' 3 (1.6 m)      Weight change:  No intake or output data in the 24 hours ending 12/23/23 1147 BP 122/86 (BP Location: Left Arm)   Pulse 81   Temp 98.1 F (36.7 C) (Oral)   Resp 14   Ht 5' 3 (1.6  m)   Wt 64.9 kg   LMP 10/28/2023 (Approximate)   SpO2 96%   BMI 25.33 kg/m  General appearance: no distress and slowed mentation Head: Normocephalic, without obvious abnormality, atraumatic Resp: clear to auscultation bilaterally Cardio: regular rate and rhythm, S1, S2 normal, no murmur, click, rub or gallop GI: soft, non-tender; bowel sounds normal; no masses,  no organomegaly Extremities: extremities normal, atraumatic, no cyanosis or  edema and left thigh AVG +T/B Dialysis Access:  Dialysis Orders: Center: [facility] MWF Last tx 9/22 3:30h, EDW 53kg, AVG L thigh 15g, 17H elisio BFR 400, DFR 500, 2/2.5 dialysate RN reports commonly has oozing around needle site Venofer  50 weekly last 9/22 Mircera last dosed last week  Heparing 2500 loading + 1000 hourly Calictriol 1.75 three times per week  9/25 Hb 8.4 today 5.3     Assessment/PlanAnnette Pilger is an 48 y.o. female with ESRD on HD, HTN, SLE, DVT, seizure, mod intellectual disabilities, depression currently admitted for GIB and nephrology is consulted for co management of ESRD and associated conditions.   Assessment/Plan:  ABLA and h/o GIB: Hb 5.3 and to have blood transfusion in ED.  On PPI. GI consult pending.  Had EGD last admission without finding etiology.  Plan was four outpatient colonoscopy but may need to be done while an inpatient given recurrence of ABLA.  ESRD -  She did complete 3 hours of her HD session today so no need for further HD.  Volume and labs stable.  Will keep on MWF schedule while she remains an inpatient.  No heparin  with HD.   Hypertension/volume  - She left her HD unit at her edw today and had UF of 0.7kg.  No evidence of edema or volume overload.  UF as bp tolerates.  Continue with midodrine  10 mg bid with HD   Anemia  - as above  Metabolic bone disease -  no binders prescribed.  Will check phos level.  Ca 8.7.  Nutrition - renal diet  H/o Covid-19 + during last hospitalization (12/03/23 as an outpatient).  No symptoms.  Hypokalemia:  will supplement today and check in AM to allow proper dialysate use.   Fairy RONAL Sellar, MD [facility], [facility]  12/23/2023, 11:47 AM

## 2023-12-23 NOTE — ED Notes (Signed)
 Restricted band applied to pt left arm

## 2023-12-23 NOTE — H&P (Signed)
 History and Physical    Patient: [name] FMW:[id number] DOB: 21-Sep-1975 DOA: 12/23/2023 DOS: the patient was seen and examined on 12/23/2023 PCP: Wilmon Penton, NP  Patient coming from: Home  Chief Complaint:  Chief Complaint  Patient presents with   low hemoglobin   HPI: [name] is a 48 y.o. female with medical history significant of  ESRD on dialysis, diabetes mellitus, DVT, hypertension, seizure, SLE, moderate intellectual disabilities presented to the emergency department for evaluation of low hemoglobin.  Patient was at dialysis center where she was noted to have hemoglobin of 5.9 advised to come to the emergency department for further management evaluation.  Patient denies any chest pain, shortness of breath, dizziness or lightheadedness.  She is a poor historian given intellectual disabilities.  She denies active rectal bleeding or dark stools. Refused rectal exam. Patient had prior admission with dark stools 12/07/2023 where the hemoglobin was down to 7, GI did endoscopy which showed gastritis, hemoglobin improved and she was discharged. In the emergency department, laboratory workup significant for potassium of 3.2, Phos 1.7, hemoglobin 5.3.  2 units of blood transfusion ordered, potassium supplementation ordered.  ED physician requested GI, nephrology evaluation.  Hospitalist consulted for inpatient admission for further management evaluation of acute on chronic anemia, possible GI bleed.  Review of Systems: As mentioned in the history of present illness. All other systems reviewed and are negative. Past Medical History:  Diagnosis Date   Anemia    BV (bacterial vaginosis) 11/08/2013   Depression    Dialysis patient    DVT (deep venous thrombosis) (HCC)    Hypertension    Lupus    Renal disorder    Vaginal odor 11/08/2013   Past Surgical History:  Procedure Laterality Date   A/V SHUNT INTERVENTION N/A 10/24/2016   Procedure: A/V SHUNT  INTERVENTION;  Surgeon: Jama Cordella MATSU, MD;  Location: [facility];  Service: Cardiovascular;  Laterality: N/A;   A/V SHUNT INTERVENTION Left 09/15/2023   Procedure: A/V SHUNT INTERVENTION;  Surgeon: Lanis Fonda BRAVO, MD;  Location: [facility];  Service: Cardiovascular;  Laterality: Left;   A/V SHUNTOGRAM Left 12/08/2017   Procedure: A/V SHUNTOGRAM;  Surgeon: Jama Cordella MATSU, MD;  Location: [facility];  Service: Cardiovascular;  Laterality: Left;   A/V SHUNTOGRAM Left 01/28/2018   Procedure: A/V SHUNTOGRAM;  Surgeon: [name], MD;  Location: [facility];  Service: Cardiovascular;  Laterality: Left;   A/V SHUNTOGRAM Left 04/01/2018   Procedure: A/V SHUNTOGRAM;  Surgeon: [name], MD;  Location: [facility];  Service: Cardiovascular;  Laterality: Left;   A/V SHUNTOGRAM Left 04/28/2019   Procedure: A/V SHUNTOGRAM;  Surgeon: [name], MD;  Location: [facility];  Service: Cardiovascular;  Laterality: Left;   AV FISTULA PLACEMENT     AV FISTULA PLACEMENT Left 02/09/2022   Procedure: FISTULAGRAM, placement of left arm stent.;  Surgeon: [name], MD;  Location: [facility];  Service: Vascular;  Laterality: Left;   AV FISTULA PLACEMENT Right 09/02/2022   Procedure: EXPLORATION OF RIGHT AXILLA, INADEQUATE VEIN ACCESS;  Surgeon: Oris Krystal FALCON, MD;  Location: [facility];  Service: Vascular;  Laterality: Right;   AV FISTULA PLACEMENT Left 09/25/2022   Procedure: INSERTION OF LEFT FEMORAL LOOP GRAFT USING 4-38mm GORETEX STRETCH GRAFT;  Surgeon: [name], MD;  Location: [facility];  Service: Vascular;  Laterality: Left;   DIALYSIS/PERMA CATHETER INSERTION  10/23/2016   Procedure: DIALYSIS/PERMA CATHETER  INSERTION;  Surgeon: [name], MD;  Location: [facility];  Service: Cardiovascular;;   ESOPHAGOGASTRODUODENOSCOPY N/A 12/08/2023   Procedure: EGD (ESOPHAGOGASTRODUODENOSCOPY);  Surgeon: Eartha Flavors, Toribio, MD;  Location: [facility];   Service: Gastroenterology;  Laterality: N/A;   IR AV DIALY SHUNT INTRO NEEDLE/INTRACATH INITIAL W/PTA/IMG LEFT  05/12/2023   IR REMOVAL TUN CV CATH W/O FL  09/22/2017   IR REMOVAL TUN CV CATH W/O FL  01/06/2023   IR THROMBECTOMY AV FISTULA W/THROMBOLYSIS INC/SHUNT/IMG LEFT Left 09/04/2017   IR US  GUIDE VASC ACCESS LEFT  09/04/2017   IR US  GUIDE VASC ACCESS LEFT  05/12/2023   MASS EXCISION Left 06/01/2018   Procedure: EXCISION 5 X 3CM LIPOMA LEFT THIGH;  Surgeon: [name], MD;  Location: [facility];  Service: General;  Laterality: Left;   PERIPHERAL VASCULAR CATHETERIZATION Left 01/04/2015   Procedure: A/V Shuntogram/Fistulagram;  Surgeon: Selinda RAMAN Marea, MD;  Location: [facility];  Service: Cardiovascular;  Laterality: Left;   PERIPHERAL VASCULAR CATHETERIZATION N/A 01/04/2015   Procedure: A/V Shunt Intervention;  Surgeon: Selinda RAMAN Marea, MD;  Location: [facility];  Service: Cardiovascular;  Laterality: N/A;   PERIPHERAL VASCULAR CATHETERIZATION Left 04/13/2015   Procedure: A/V Shuntogram/Fistulagram;  Surgeon: [name], MD;  Location: [facility];  Service: Cardiovascular;  Laterality: Left;   PERIPHERAL VASCULAR CATHETERIZATION Left 04/13/2015   Procedure: A/V Shunt Intervention;  Surgeon: [name], MD;  Location: [facility];  Service: Cardiovascular;  Laterality: Left;   REMOVAL OF A DIALYSIS CATHETER Left 09/25/2022   Procedure: REMOVAL OF LEFT INTERNAL JUGULAR HERO CATHETER;  Surgeon: [name], MD;  Location: [facility];  Service: Vascular;  Laterality: Left;   UPPER EXTREMITY VENOGRAPHY N/A 08/26/2022   Procedure: UPPER EXTREMITY VENOGRAPHY;  Surgeon: [name], MD;  Location: [facility];  Service: Cardiovascular;  Laterality: N/A;   VENOUS ANGIOPLASTY  09/15/2023   Procedure: VENOUS ANGIOPLASTY;  Surgeon: Lanis Fonda BRAVO, MD;  Location: [facility];  Service: Cardiovascular;;  leg loop graft   Social History:  reports that she  has been smoking cigarettes. She has a 1.5 pack-year smoking history. She has been exposed to tobacco smoke. She has never used smokeless tobacco. She reports that she does not drink alcohol  and does not use drugs.  Allergies  Allergen Reactions   Dust Mite Extract Other (See Comments)    sneezing    Family History  Problem Relation Age of Onset   Seizures Son    Kidney disease Mother        on dialysis   Lupus Mother     Prior to Admission medications   Medication Sig Start Date End Date Taking? Authorizing Provider  albuterol  (PROVENTIL ) (2.5 MG/3ML) 0.083% nebulizer solution Take 2.5 mg by nebulization 4 (four) times daily as needed for wheezing or shortness of breath.     [provider]  ALPRAZolam  (XANAX ) 0.25 MG tablet Take 0.25 mg by mouth 2 (two) times daily.    [provider]  ammonium lactate  (AMLACTIN DAILY) 12 % lotion Apply 1 Application topically as needed for dry skin. 04/23/23   [name], DPM  Calcium Carbonate Antacid (TUMS PO) Take 1,000 mg by mouth 2 (two) times daily between meals. 06/13/22   [provider]  chlorhexidine  (PERIDEX ) 0.12 % solution Use as directed 15 mLs in the mouth or throat 2 (two) times daily. After regular toothbrushing, rinse mouth 08/20/22  [provider]  clopidogrel  (PLAVIX ) 75 MG tablet Take 75 mg by mouth daily.    [provider]  cycloSPORINE (RESTASIS) 0.05 % ophthalmic emulsion Place 1 drop into both eyes 2 (two) times daily. 01/31/21   [provider]  docusate sodium  (COLACE) 100 MG capsule Take 100 mg by mouth daily.    [provider]  epoetin  alfa (EPOGEN ,PROCRIT) 4000 UNIT/ML injection Inject 4,000 Units into the vein See admin instructions. Mon, Wed, and Friday at dialysis    [provider]  guaiFENesin  (ROBITUSSIN) 100 MG/5ML liquid Take 200 mg by mouth every 6 (six) hours as needed for cough.     [provider]  hydroxychloroquine  (PLAQUENIL )  200 MG tablet Take 200 mg by mouth daily.    [provider]  ipratropium (ATROVENT ) 0.02 % nebulizer solution Take 0.5 mg by nebulization 4 (four) times daily as needed for wheezing or shortness of breath (For Breathing).    [provider]  lidocaine -prilocaine  (EMLA ) cream Apply 1 application topically as needed (port access).  12/20/12   [provider]  loratadine (CLARITIN) 10 MG tablet Take 10 mg by mouth daily as needed for allergies.    [provider]  midodrine  (PROAMATINE ) 10 MG tablet Take 10 mg by mouth 2 (two) times daily.     [provider]  mirtazapine  (REMERON ) 15 MG tablet Take 7.5 mg by mouth at bedtime.  11/03/13   [provider]  multivitamin (RENA-VIT) TABS tablet Take 1 tablet by mouth daily.    [provider]  pantoprazole  (PROTONIX ) 40 MG tablet Take 40 mg by mouth daily.    [provider]  phenytoin  (DILANTIN ) 100 MG ER capsule Take 100 mg by mouth 2 (two) times daily.     [provider]  sertraline  (ZOLOFT ) 100 MG tablet Take 200 mg by mouth daily.     [provider]  simvastatin  (ZOCOR ) 40 MG tablet Take 40 mg by mouth at bedtime.    [provider]    Physical Exam: Vitals:   12/23/23 1018 12/23/23 1019  BP: 122/86   Pulse: 81   Resp: 14   Temp: 98.1 F (36.7 C)   TempSrc: Oral   SpO2: 96%   Weight:  64.9 kg  Height:  5' 3 (1.6 m)   General - Middle aged African American ill looking female, no apparent distress HEENT - PERRLA, EOMI, atraumatic head, non tender sinuses. Lung - Clear, basal rales, no rhonchi, wheezes. Heart - S1, S2 heard, no murmurs, rubs, trace pedal edema. Abdomen - Soft, non tender, bowel sounds good Neuro - Alert, awake and oriented, non focal exam. Skin - Warm and dry.  Data Reviewed:     Latest Ref Rng & Units 12/23/2023   10:46 AM 12/10/2023    4:34 AM 12/09/2023    4:29 AM  CBC  WBC 4.0 - 10.5 K/uL 6.9  5.0  6.0    Hemoglobin 12.0 - 15.0 g/dL 5.3  8.4  8.6   Hematocrit 36.0 - 46.0 % 16.7  24.2  25.7   Platelets 150 - 400 K/uL 305  171  153       Latest Ref Rng & Units 12/23/2023   10:46 AM 12/10/2023    4:34 AM 12/09/2023    4:29 AM  BMP  Glucose 70 - 99 mg/dL 84  86  89   BUN 6 - 20 mg/dL 10  21  30    Creatinine 0.44 - 1.00  mg/dL 7.37  5.27  3.16   Sodium 135 - 145 mmol/L 141  134  137   Potassium 3.5 - 5.1 mmol/L 3.2  3.6  4.0   Chloride 98 - 111 mmol/L 99  95  99   CO2 22 - 32 mmol/L 31  27  24    Calcium 8.9 - 10.3 mg/dL 8.7  7.9  8.5    DG Chest Port 1 View Result Date: 12/23/2023 EXAM: 1 VIEW(S) XRAY OF THE CHEST 12/23/2023 10:40:00 AM COMPARISON: 12/10/2023 CLINICAL HISTORY: weakness. Pt BIB [facility] from dialysis for c/o low hemoglobin; dialysis states pt's hemoglobin is 5.9; pt was seen here last week for same complaint and received blood; pt is unsure how many; ; Pt denies any sob or pain. Hx of DVT, hypertension. Current everyday smoker FINDINGS: LUNGS AND PLEURA: Interval increase in right pleural effusion which is now moderate in volume compared to small. No focal pulmonary opacity. No pulmonary edema. No pneumothorax. HEART AND MEDIASTINUM: Vascular stents noted. No acute abnormality of the cardiac and mediastinal silhouettes. BONES AND SOFT TISSUES: No acute osseous abnormality. IMPRESSION: 1. Interval increase in right pleural effusion, now moderate in volume compared to small. 2. No focal pulmonary opacity, pulmonary edema, or pneumothorax. Electronically signed by: Norleen Boxer MD 12/23/2023 11:12 AM EDT RP Workstation: [id number]   Assessment and Plan:  [name] is a 48 y.o. female with medical history significant for ESRD on dialysis, diabetes mellitus, DVT, hypertension, seizure, SLE, moderate intellectual disabilities presented to the emergency department for evaluation of low hemoglobin.  Patient had recent EGD which showed gastritis.  GI, nephrology consulted, she is being  admitted for further management and evaluation of acute on chronic anemia, possible GI bleed.  Plan: Acute on chronic anemia Possible GI bleed/acute blood loss anemia- Presented with hemoglobin of 5.3, baseline around 8.5 Admit the patient to medicine telemetry service. Patient will receive 2 units of blood in the emergency department. Close monitoring of H&H, transfuse for hemoglobin less than 7. Continue to hold Plavix , no heparin  for DVT.  Continue IV PPI. Patient had recent GI bleed, EGD showed gastritis no active bleeding. She is seen by gastroenterology, recommended proceeding with colonoscopy. Will get anemia panel. Bowel prep for colonoscopy as per GI.  ESRD on hemodialysis: Nephrology on board for hemodialysis needs. Correction of K with HD.  Hypokalemia: Oral repletion ordered. Further correction with dialysis.  Hypophosphatemia: Will give K-Phos one-time dose.  Hypertension: Not on hypertensives Continue midodrine  home dose.  Seizure disorder- Continue Dilantin  therapy.  COVID-positive 12/07/2023- No symptoms of COVID. No need for isolation as it is more than 10 days.    Advance Care Planning:   Code Status: Full Code discussed with patient.  Consults: GI, nephrology.  Family Communication: none  Severity of Illness: The appropriate patient status for this patient is INPATIENT. Inpatient status is judged to be reasonable and necessary in order to provide the required intensity of service to ensure the patient's safety. The patient's presenting symptoms, physical exam findings, and initial radiographic and laboratory data in the context of their chronic comorbidities is felt to place them at high risk for further clinical deterioration. Furthermore, it is not anticipated that the patient will be medically stable for discharge from the hospital within 2 midnights of admission.   * I certify that at the point of admission it is my clinical judgment that the  patient will require inpatient hospital care spanning beyond 2 midnights from the point of admission due to  high intensity of service, high risk for further deterioration and high frequency of surveillance required.*  Author: Concepcion Riser, MD 12/23/2023 11:59 AM  For on call review [email or web address].

## 2023-12-23 NOTE — ED Triage Notes (Signed)
 Pt BIB [facility] from dialysis for c/o low hemoglobin; dialysis states pt's hemoglobin is 5.9; pt was seen here last week for same complaint and received blood; pt is unsure how many  Pt denies any sob or pain  VS with EMS 121/63 P 90 O2 97% on RA

## 2023-12-23 NOTE — Consult Note (Addendum)
 Gastroenterology Consult   Referring Provider: No ref. provider found Primary Care Physician:  Gammon, Chrystal, NP Primary Gastroenterologist:  Dr. Eartha   Patient ID: [name]; [id number]; [birth year]   Admit date: 12/23/2023  LOS: 0 days   Date of Consultation: 12/23/2023  Reason for Consultation:  anemia   History of Present Illness   Hisayo Delossantos is a 48 y.o. year old female ESRD on dialysis MWF, anemia, depression, DVT, HTN, lupus, with recent admission at the end of September for symptomatic anemia, who presented to the ED with low hemoglobin in outpatient setting (5.9), denied any other GI symptoms. GI consulted for further evaluation  ED Course:  Potassium 3.2 Hgb 5.3 (8.4 on 9/25) Afebrile BP 114/75 HR 91   Consult:  Patient with recent admission on 9/23 for significant anemia, EGD at that time unremarkable, plans for outpatient colonoscopy as she had no overt bleeding.   Patient states she is feeling good, was unaware her hgb may have dropped. Denies any GI symptoms. Reported last dose of plavix  was this AM. Denies NSAID use. No constipation, diarrhea, rectal bleeding, melena, nausea, vomiting, early satiety or changes in appetite. She does not want to have a colonoscopy but cannot tell me why she does not wish to have this done.    Last EGD:12/08/23 - Normal esophagus.                           - Gastritis.                           - Normal examined duodenum.                           - No specimens collected. Last TCS:  never    Past Medical History:  Diagnosis Date   Anemia    BV (bacterial vaginosis) 11/08/2013   Depression    Dialysis patient    DVT (deep venous thrombosis) (HCC)    Hypertension    Lupus    Renal disorder    Vaginal odor 11/08/2013    Past Surgical History:  Procedure Laterality Date   A/V SHUNT INTERVENTION N/A 10/24/2016   Procedure: A/V SHUNT INTERVENTION;  Surgeon: Jama Cordella MATSU, MD;  Location:  [facility];  Service: Cardiovascular;  Laterality: N/A;   A/V SHUNT INTERVENTION Left 09/15/2023   Procedure: A/V SHUNT INTERVENTION;  Surgeon: Lanis Fonda BRAVO, MD;  Location: [facility];  Service: Cardiovascular;  Laterality: Left;   A/V SHUNTOGRAM Left 12/08/2017   Procedure: A/V SHUNTOGRAM;  Surgeon: Jama Cordella MATSU, MD;  Location: [facility];  Service: Cardiovascular;  Laterality: Left;   A/V SHUNTOGRAM Left 01/28/2018   Procedure: A/V SHUNTOGRAM;  Surgeon: [name], MD;  Location: [facility];  Service: Cardiovascular;  Laterality: Left;   A/V SHUNTOGRAM Left 04/01/2018   Procedure: A/V SHUNTOGRAM;  Surgeon: [name], MD;  Location: [facility];  Service: Cardiovascular;  Laterality: Left;   A/V SHUNTOGRAM Left 04/28/2019   Procedure: A/V SHUNTOGRAM;  Surgeon: [name], MD;  Location: [facility];  Service: Cardiovascular;  Laterality: Left;   AV FISTULA PLACEMENT     AV FISTULA PLACEMENT Left 02/09/2022   Procedure: FISTULAGRAM, placement of left arm stent.;  Surgeon: [name], MD;  Location: [facility];  Service: Vascular;  Laterality: Left;   AV FISTULA PLACEMENT Right 09/02/2022   Procedure: EXPLORATION OF RIGHT AXILLA, INADEQUATE VEIN ACCESS;  Surgeon: Oris Krystal FALCON, MD;  Location: [facility];  Service: Vascular;  Laterality: Right;   AV FISTULA PLACEMENT Left 09/25/2022   Procedure: INSERTION OF LEFT FEMORAL LOOP GRAFT USING 4-46mm GORETEX STRETCH GRAFT;  Surgeon: [name], MD;  Location: [facility];  Service: Vascular;  Laterality: Left;   DIALYSIS/PERMA CATHETER INSERTION  10/23/2016   Procedure: DIALYSIS/PERMA CATHETER INSERTION;  Surgeon: [name], MD;  Location: [facility];  Service: Cardiovascular;;   ESOPHAGOGASTRODUODENOSCOPY N/A 12/08/2023   Procedure: EGD (ESOPHAGOGASTRODUODENOSCOPY);  Surgeon: Eartha Flavors, Toribio, MD;  Location: [facility];  Service: Gastroenterology;  Laterality: N/A;   IR AV  DIALY SHUNT INTRO NEEDLE/INTRACATH INITIAL W/PTA/IMG LEFT  05/12/2023   IR REMOVAL TUN CV CATH W/O FL  09/22/2017   IR REMOVAL TUN CV CATH W/O FL  01/06/2023   IR THROMBECTOMY AV FISTULA W/THROMBOLYSIS INC/SHUNT/IMG LEFT Left 09/04/2017   IR US  GUIDE VASC ACCESS LEFT  09/04/2017   IR US  GUIDE VASC ACCESS LEFT  05/12/2023   MASS EXCISION Left 06/01/2018   Procedure: EXCISION 5 X 3CM LIPOMA LEFT THIGH;  Surgeon: [name], MD;  Location: [facility];  Service: General;  Laterality: Left;   PERIPHERAL VASCULAR CATHETERIZATION Left 01/04/2015   Procedure: A/V Shuntogram/Fistulagram;  Surgeon: Selinda RAMAN Marea, MD;  Location: [facility];  Service: Cardiovascular;  Laterality: Left;   PERIPHERAL VASCULAR CATHETERIZATION N/A 01/04/2015   Procedure: A/V Shunt Intervention;  Surgeon: Selinda RAMAN Marea, MD;  Location: [facility];  Service: Cardiovascular;  Laterality: N/A;   PERIPHERAL VASCULAR CATHETERIZATION Left 04/13/2015   Procedure: A/V Shuntogram/Fistulagram;  Surgeon: [name], MD;  Location: [facility];  Service: Cardiovascular;  Laterality: Left;   PERIPHERAL VASCULAR CATHETERIZATION Left 04/13/2015   Procedure: A/V Shunt Intervention;  Surgeon: [name], MD;  Location: [facility];  Service: Cardiovascular;  Laterality: Left;   REMOVAL OF A DIALYSIS CATHETER Left 09/25/2022   Procedure: REMOVAL OF LEFT INTERNAL JUGULAR HERO CATHETER;  Surgeon: [name], MD;  [facility];  Service: Vascular;  Laterality: Left;   UPPER EXTREMITY VENOGRAPHY N/A 08/26/2022   Procedure: UPPER EXTREMITY VENOGRAPHY;  Surgeon: [name], MD;  [facility];  Service: Cardiovascular;  Laterality: N/A;   VENOUS ANGIOPLASTY  09/15/2023   Procedure: VENOUS ANGIOPLASTY;  Surgeon: Lanis Fonda BRAVO, MD;  Location: [facility];  Service: Cardiovascular;;  leg loop graft    Prior to Admission medications   Medication Sig Start Date End Date Taking? Authorizing  Provider  albuterol  (PROVENTIL ) (2.5 MG/3ML) 0.083% nebulizer solution Take 2.5 mg by nebulization 4 (four) times daily as needed for wheezing or shortness of breath.     [provider]  ALPRAZolam  (XANAX ) 0.25 MG tablet Take 0.25 mg by mouth 2 (two) times daily.    [provider]  ammonium lactate  (AMLACTIN DAILY) 12 % lotion Apply 1 Application topically as needed for dry skin. 04/23/23   [name], DPM  Calcium Carbonate Antacid (TUMS PO) Take 1,000 mg by mouth 2 (two) times daily between meals. 06/13/22   [provider]  chlorhexidine  (PERIDEX ) 0.12 % solution Use as directed 15 mLs in the mouth or throat 2 (two) times daily. After regular toothbrushing, rinse mouth 08/20/22   [provider]  clopidogrel  (PLAVIX ) 75 MG tablet Take 75  mg by mouth daily.    [provider]  cycloSPORINE (RESTASIS) 0.05 % ophthalmic emulsion Place 1 drop into both eyes 2 (two) times daily. 01/31/21   [provider]  docusate sodium  (COLACE) 100 MG capsule Take 100 mg by mouth daily.    [provider]  epoetin  alfa (EPOGEN ,PROCRIT) 4000 UNIT/ML injection Inject 4,000 Units into the vein See admin instructions. Mon, Wed, and Friday at dialysis    [provider]  guaiFENesin  (ROBITUSSIN) 100 MG/5ML liquid Take 200 mg by mouth every 6 (six) hours as needed for cough.     [provider]  hydroxychloroquine  (PLAQUENIL ) 200 MG tablet Take 200 mg by mouth daily.    [provider]  ipratropium (ATROVENT ) 0.02 % nebulizer solution Take 0.5 mg by nebulization 4 (four) times daily as needed for wheezing or shortness of breath (For Breathing).    [provider]  lidocaine -prilocaine  (EMLA ) cream Apply 1 application topically as needed (port access).  12/20/12   [provider]  loratadine (CLARITIN) 10 MG tablet Take 10 mg by mouth daily as needed for allergies.    [provider]  midodrine  (PROAMATINE ) 10  MG tablet Take 10 mg by mouth 2 (two) times daily.     [provider]  mirtazapine  (REMERON ) 15 MG tablet Take 7.5 mg by mouth at bedtime.  11/03/13   [provider]  multivitamin (RENA-VIT) TABS tablet Take 1 tablet by mouth daily.    [provider]  pantoprazole  (PROTONIX ) 40 MG tablet Take 40 mg by mouth daily.    [provider]  phenytoin  (DILANTIN ) 100 MG ER capsule Take 100 mg by mouth 2 (two) times daily.     [provider]  sertraline  (ZOLOFT ) 100 MG tablet Take 200 mg by mouth daily.     [provider]  simvastatin  (ZOCOR ) 40 MG tablet Take 40 mg by mouth at bedtime.    [provider]    Current Facility-Administered Medications  Medication Dose Route Frequency Provider Last Rate Last Admin   0.9 %  sodium chloride  infusion (Manually program via Guardrails IV Fluids)   Intravenous Once Rigney, Christopher D, PA-C       potassium chloride  SA (KLOR-CON  M) CR tablet 40 mEq  40 mEq Oral Once Rigney, Christopher D, PA-C       Current Outpatient Medications  Medication Sig Dispense Refill   albuterol  (PROVENTIL ) (2.5 MG/3ML) 0.083% nebulizer solution Take 2.5 mg by nebulization 4 (four) times daily as needed for wheezing or shortness of breath.      ALPRAZolam  (XANAX ) 0.25 MG tablet Take 0.25 mg by mouth 2 (two) times daily.     ammonium lactate  (AMLACTIN DAILY) 12 % lotion Apply 1 Application topically as needed for dry skin. 400 g 0   Calcium Carbonate Antacid (TUMS PO) Take 1,000 mg by mouth 2 (two) times daily between meals.     chlorhexidine  (PERIDEX ) 0.12 % solution Use as directed 15 mLs in the mouth or throat 2 (two) times daily. After regular toothbrushing, rinse mouth     clopidogrel  (PLAVIX ) 75 MG tablet Take 75 mg by mouth daily.     cycloSPORINE (RESTASIS) 0.05 % ophthalmic emulsion Place 1 drop into both eyes 2 (two) times daily.     docusate sodium  (COLACE) 100 MG capsule Take 100 mg by mouth daily.      epoetin  alfa (EPOGEN ,PROCRIT) 4000 UNIT/ML injection Inject 4,000 Units into the vein See admin instructions. [place], Wed, and  Friday at dialysis     guaiFENesin  (ROBITUSSIN) 100 MG/5ML liquid Take 200 mg by mouth every 6 (six) hours as needed for cough.      hydroxychloroquine  (PLAQUENIL ) 200 MG tablet Take 200 mg by mouth daily.     ipratropium (ATROVENT ) 0.02 % nebulizer solution Take 0.5 mg by nebulization 4 (four) times daily as needed for wheezing or shortness of breath (For Breathing).     lidocaine -prilocaine  (EMLA ) cream Apply 1 application topically as needed (port access).      loratadine (CLARITIN) 10 MG tablet Take 10 mg by mouth daily as needed for allergies.     midodrine  (PROAMATINE ) 10 MG tablet Take 10 mg by mouth 2 (two) times daily.      mirtazapine  (REMERON ) 15 MG tablet Take 7.5 mg by mouth at bedtime.      multivitamin (RENA-VIT) TABS tablet Take 1 tablet by mouth daily.     pantoprazole  (PROTONIX ) 40 MG tablet Take 40 mg by mouth daily.     phenytoin  (DILANTIN ) 100 MG ER capsule Take 100 mg by mouth 2 (two) times daily.      sertraline  (ZOLOFT ) 100 MG tablet Take 200 mg by mouth daily.      simvastatin  (ZOCOR ) 40 MG tablet Take 40 mg by mouth at bedtime.      Allergies as of 12/23/2023 - Review Complete 12/23/2023  Allergen Reaction Noted   Dust mite extract Other (See Comments) 11/08/2013    Family History  Problem Relation Age of Onset   Seizures Son    Kidney disease Mother        on dialysis   Lupus Mother     Social History   Socioeconomic History   Marital status: Single    Spouse name: Not on file   Number of children: Not on file   Years of education: Not on file   Highest education level: Not on file  Occupational History   Not on file  Tobacco Use   Smoking status: Every Day    Current packs/day: 0.10    Average packs/day: 0.1 packs/day for 15.0 years (1.5 ttl pk-yrs)    Types: Cigarettes    Passive exposure: Current   Smokeless tobacco:  Never  Vaping Use   Vaping status: Never Used  Substance and Sexual Activity   Alcohol  use: No   Drug use: No   Sexual activity: Not Currently    Birth control/protection: None, Abstinence  Other Topics Concern   Not on file  Social History Narrative   Lives at [facility]2   Social Drivers of Health   Financial Resource Strain: Low Risk  (07/01/2022)   Overall Financial Resource Strain (CARDIA)    Difficulty of Paying Living Expenses: Not hard at all  Food Insecurity: No Food Insecurity (12/08/2023)   Hunger Vital Sign    Worried About Running Out of Food in the Last Year: Never true    Ran Out of Food in the Last Year: Never true  Transportation Needs: No Transportation Needs (12/08/2023)   PRAPARE - Administrator, Civil Service (Medical): No    Lack of Transportation (Non-Medical): No  Physical Activity: Inactive (07/01/2022)   Exercise Vital Sign    Days of Exercise per Week: 0 days    Minutes of Exercise per Session: 0 min  Stress: No Stress Concern Present (07/01/2022)   [employer] of Occupational Health - Occupational Stress Questionnaire    Feeling of Stress : Not at all  Social Connections: Socially Isolated (07/01/2022)   Social Connection and Isolation Panel    Frequency of Communication with Friends and Family: Once a week    Frequency of Social Gatherings with Friends and Family: Once a week    Attends Religious Services: 1 to 4 times per year    Active Member of [employer] or Organizations: No    Attends Banker Meetings: Never    Marital Status: Never married  Intimate Partner Violence: Not At Risk (12/08/2023)   Humiliation, Afraid, Rape, and Kick questionnaire    Fear of Current or Ex-Partner: No    Emotionally Abused: No    Physically Abused: No    Sexually Abused: No     Review of Systems   Gen: Denies any fever, chills, loss of appetite, change in weight or weight loss CV: Denies chest pain, heart palpitations,  syncope, edema  Resp: Denies shortness of breath with rest, cough, wheezing, coughing up blood, and pleurisy. GI: Denies melena, hematochezia, nausea, vomiting, diarrhea, constipation, dysphagia, odyonophagia, early satiety or weight loss.  GU : Denies urinary burning, blood in urine, urinary frequency, and urinary incontinence. MS: Denies joint pain, limitation of movement, swelling, cramps, and atrophy.  Derm: Denies rash, itching, dry skin, hives. Psych: Denies depression, anxiety, memory loss, hallucinations, and confusion. Heme: Denies bruising or bleeding Neuro:  Denies any headaches, dizziness, paresthesias, shaking  Physical Exam   Vital Signs in last 24 hours: Temp:  [98.1 F (36.7 C)] 98.1 F (36.7 C) (10/08 1018) Pulse Rate:  [81] 81 (10/08 1018) Resp:  [14] 14 (10/08 1018) BP: (122)/(86) 122/86 (10/08 1018) SpO2:  [96 %] 96 % (10/08 1018) Weight:  [64.9 kg] 64.9 kg (10/08 1019)   General:   Alert,  Well-developed, well-nourished, pleasant and cooperative in NAD Head:  Normocephalic and atraumatic. Eyes:  Sclera clear, no icterus.   Conjunctiva pink. Ears:  Normal auditory acuity. Mouth:  No deformity or lesions, dentition normal. Neck:  Supple; no masses Lungs:  Clear throughout to auscultation.   No wheezes, crackles, or rhonchi. No acute distress. Heart:  Regular rate and rhythm; no murmurs, clicks, rubs,  or gallops. Abdomen:  Soft, nontender and nondistended. No masses, hepatosplenomegaly or hernias noted. Normal bowel sounds, without guarding, and without rebound.   Msk:  Symmetrical without gross deformities. Normal posture. Extremities:  Without clubbing or edema. Neurologic:  Alert and  oriented x4. Skin:  Intact without significant lesions or rashes. Psych:  Alert and cooperative. Flat affect   Labs/Studies   Recent Labs Recent Labs    12/23/23 1046  WBC 6.9  HGB 5.3*  HCT 16.7*  PLT 305   BMET Recent Labs    12/23/23 1046  NA 141  K 3.2*   CL 99  CO2 31  GLUCOSE 84  BUN 10  CREATININE 2.62*  CALCIUM 8.7*   LFT Recent Labs    12/23/23 1046  PROT 6.2*  ALBUMIN  3.9  AST 35  ALT 25  ALKPHOS 67  BILITOT <0.2    Radiology/Studies DG Chest Port 1 View Result Date: 12/23/2023 EXAM: 1 VIEW(S) XRAY OF THE CHEST 12/23/2023 10:40:00 AM COMPARISON: 12/10/2023 CLINICAL HISTORY: weakness. Pt BIB [facility] from dialysis for c/o low hemoglobin; dialysis states pt's hemoglobin is 5.9; pt was seen here last week for same complaint and received blood; pt is unsure how many; ; Pt denies any sob or pain. Hx of DVT, hypertension. Current everyday smoker FINDINGS: LUNGS AND PLEURA: Interval increase in right pleural effusion  which is now moderate in volume compared to small. No focal pulmonary opacity. No pulmonary edema. No pneumothorax. HEART AND MEDIASTINUM: Vascular stents noted. No acute abnormality of the cardiac and mediastinal silhouettes. BONES AND SOFT TISSUES: No acute osseous abnormality. IMPRESSION: 1. Interval increase in right pleural effusion, now moderate in volume compared to small. 2. No focal pulmonary opacity, pulmonary edema, or pneumothorax. Electronically signed by: Norleen Boxer MD 12/23/2023 11:12 AM EDT RP Workstation: [id number]    Assessment   Arthurine Oleary is a 48 year old female with history of ESRD on dialysis MWF, anemia, depression, DVT, HTN, lupus, with recent admission for anemia who underwent EGD which was unremarkable other than gastritis, now presenting to the ED from dialysis with significant anemia as outpatient. GI consulted for further evaluation.    Anemia/melena/heme positive stool:  -recurrent significant decline in hemoglobin -no associated GI symptoms -denies NSAID use -hgb 5.3 on arrival, 8.4 on 9/25  -denies any history of GI bleed in the past -BUN is WNL -EGD last month with gastritis  -no previous colonoscopy -heme positive stool on 9/22 -on plavix , last dose this morning   Etiology of  acute on chronic anemia is unclear, suspect some aspect of anemia of chronic disease given ESRD, though consider significant decline in hemoglobin over the last 2 weeks and no findings on EGD last month to explain any GI looses, I am recommending Colonoscopy this admission for evaluation of GI losses in the LGI tract which I discussed with the patient. Patient tells me she does not wish to undergo colonoscopy, cannot give me a reason why she does not want to pursue this but states she is not going to have the colonoscopy. Thorough discussion held with the patient regarding risks vs. Benefits and potential adverse events if she does have GI source of bleeding that is not identified, however, at this time she has declined to undergo colonoscopy.    Plan / Recommendations   -PPI BID -transfusion of PRBCs -trend h&h -monitor for overt GI bleeding -recommending inpatient colonoscopy though patient has declined.  -clear liquid diet -can consider capsule endoscopy to help further evaluate for GI losses patient continues to refuse colonoscopy  -hold plavix     12/23/2023, 11:24 AM  Zahi Plaskett L. Seabron Iannello, MSN, APRN, AGNP-C Adult-Gerontology Nurse Practitioner [facility]

## 2023-12-23 NOTE — Progress Notes (Signed)
 This Clinical research associate as well as the assigned tech continue to encourage the patient to drink bowel prep. She chuckles, says it tastes nasty and that she isn't drinking it. Patient was educated on the need and encouraged again. Will continue to assess.

## 2023-12-23 NOTE — ED Notes (Signed)
 Date and time results received: 12/23/23 1105  Test: Hgb Critical Value: 5.3  Name of Provider Notified: Suzette, MD

## 2023-12-23 NOTE — Plan of Care (Signed)

## 2023-12-24 ENCOUNTER — Encounter (HOSPITAL_COMMUNITY): Payer: Self-pay

## 2023-12-24 ENCOUNTER — Encounter (HOSPITAL_COMMUNITY): Payer: Self-pay | Admitting: Internal Medicine

## 2023-12-24 ENCOUNTER — Encounter (HOSPITAL_COMMUNITY)
Admission: EM | Disposition: A | Discharge status: Skilled Nursing Facility | Payer: Self-pay | Attending: Internal Medicine

## 2023-12-24 DIAGNOSIS — Z992 Dependence on renal dialysis: Secondary | ICD-10-CM | POA: Diagnosis not present

## 2023-12-24 DIAGNOSIS — E876 Hypokalemia: Secondary | ICD-10-CM | POA: Diagnosis not present

## 2023-12-24 DIAGNOSIS — N189 Chronic kidney disease, unspecified: Secondary | ICD-10-CM

## 2023-12-24 DIAGNOSIS — N186 End stage renal disease: Secondary | ICD-10-CM | POA: Diagnosis not present

## 2023-12-24 DIAGNOSIS — I10 Essential (primary) hypertension: Secondary | ICD-10-CM | POA: Diagnosis not present

## 2023-12-24 DIAGNOSIS — D649 Anemia, unspecified: Secondary | ICD-10-CM | POA: Diagnosis not present

## 2023-12-24 LAB — TYPE AND SCREEN
ABO/RH(D): O POS
Antibody Screen: NEGATIVE
Unit division: 0
Unit division: 0

## 2023-12-24 LAB — CBC
HCT: 30.2 % — ABNORMAL LOW (ref 36.0–46.0)
Hemoglobin: 10 g/dL — ABNORMAL LOW (ref 12.0–15.0)
MCH: 27.7 pg (ref 26.0–34.0)
MCHC: 33.1 g/dL (ref 30.0–36.0)
MCV: 83.7 fL (ref 80.0–100.0)
Platelets: 303 K/uL (ref 150–400)
RBC: 3.61 MIL/uL — ABNORMAL LOW (ref 3.87–5.11)
RDW: 19.9 % — ABNORMAL HIGH (ref 11.5–15.5)
WBC: 9.8 K/uL (ref 4.0–10.5)
nRBC: 0 % (ref 0.0–0.2)

## 2023-12-24 LAB — BASIC METABOLIC PANEL WITH GFR
Anion gap: 10 (ref 5–15)
BUN: 19 mg/dL (ref 6–20)
CO2: 29 mmol/L (ref 22–32)
Calcium: 9.6 mg/dL (ref 8.9–10.3)
Chloride: 101 mmol/L (ref 98–111)
Creatinine, Ser: 4.13 mg/dL — ABNORMAL HIGH (ref 0.44–1.00)
GFR, Estimated: 13 mL/min — ABNORMAL LOW (ref >60)
Glucose, Bld: 77 mg/dL (ref 70–99)
Potassium: 4.4 mmol/L (ref 3.5–5.1)
Sodium: 140 mmol/L (ref 135–145)

## 2023-12-24 LAB — PHOSPHORUS: Phosphorus: 3.6 mg/dL (ref 2.5–4.6)

## 2023-12-24 LAB — BPAM RBC
Blood Product Expiration Date: 202511052359
Blood Product Expiration Date: 202511092359
ISSUE DATE / TIME: 202510081222
ISSUE DATE / TIME: 202510081607
Unit Type and Rh: 5100
Unit Type and Rh: 5100

## 2023-12-24 LAB — HEPATITIS B SURFACE ANTIGEN: Hepatitis B Surface Ag: NONREACTIVE

## 2023-12-24 LAB — MRSA NEXT GEN BY PCR, NASAL: MRSA by PCR Next Gen: NOT DETECTED

## 2023-12-24 SURGERY — COLONOSCOPY
Anesthesia: Choice

## 2023-12-24 MED ORDER — BISACODYL 5 MG PO TBEC
15.0000 mg | DELAYED_RELEASE_TABLET | ORAL | Status: AC
  Administered 2023-12-24: 15 mg via ORAL
  Filled 2023-12-24: qty 3

## 2023-12-24 MED ORDER — SODIUM CHLORIDE 0.9 % IV SOLN: INTRAVENOUS | Status: AC

## 2023-12-24 MED ORDER — BISACODYL 5 MG PO TBEC
10.0000 mg | DELAYED_RELEASE_TABLET | Freq: Once | ORAL | Status: AC
  Administered 2023-12-24: 10 mg via ORAL
  Filled 2023-12-24: qty 2

## 2023-12-24 MED ORDER — POLYETHYLENE GLYCOL 3350 17 GM/SCOOP PO POWD
238.0000 g | Freq: Once | ORAL | Status: AC
  Administered 2023-12-24: 238 g via ORAL
  Filled 2023-12-24: qty 238

## 2023-12-24 MED ORDER — LINACLOTIDE 145 MCG PO CAPS
290.0000 ug | ORAL_CAPSULE | Freq: Once | ORAL | Status: AC
  Administered 2023-12-24: 290 ug via ORAL
  Filled 2023-12-24: qty 2

## 2023-12-24 NOTE — Progress Notes (Signed)
 Admit: 12/23/2023 LOS: 1  3F ESRD MWF [facility] using thigh AVG with recurrent severe anemia  Subjective:  No overnight events, patient without complaints this morning, Issued 2 units of PRBCs overnight, hemoglobin went from 5.3 to 10.0. Potentially for colonoscopy today but struggled with the bowel prep Afebrile, vital signs stable Receives most of her outpatient dialysis yesterday before presentation  10/08 0701 - 10/09 0700 In: 1403.7 [I.V.:250; Blood:1153.7] Out: -   Filed Weights   12/23/23 1019 12/23/23 1619  Weight: 64.9 kg 53 kg    Scheduled Meds:  ALPRAZolam   0.25 mg Oral BID   bisacodyl  15 mg Oral STAT   calcium carbonate  1,000 mg Oral BID BM   midodrine   10 mg Oral BID   mirtazapine   7.5 mg Oral QHS   multivitamin with minerals  1 tablet Oral Daily   pantoprazole  (PROTONIX ) IV  40 mg Intravenous Q24H   phenytoin   100 mg Oral BID   phosphorus  500 mg Oral Daily   polyethylene glycol powder  238 g Oral Once   sertraline   200 mg Oral Daily   simvastatin   40 mg Oral QHS   Continuous Infusions:  sodium chloride  20 mL/hr at 12/23/23 2006   PRN Meds:.acetaminophen  **OR** acetaminophen , albuterol , guaiFENesin , HYDROmorphone  (DILAUDID ) injection, ipratropium, ondansetron  **OR** ondansetron  (ZOFRAN ) IV  Current Labs: reviewed  CBC reviewed 10/9: Hemoglobin 10.0, stable WBC and platelet count BMP reviewed from 10/9 with K of 4.4, bicarbonate 29, BUN 19  Physical Exam:  Blood pressure 122/67, pulse 88, temperature (!) 97.5 F (36.4 C), temperature source Oral, resp. rate 20, height 5' 2 (1.575 m), weight 53 kg, last menstrual period 10/28/2023, SpO2 97%. NAD Left thigh AVG with bruit and thrill No significant edema Regular, normal S1 and S2 Clear bilaterally  A Acute anemia with concern for GI bleed; baseline anemia of CKD for colonoscopy, GI following.  Transfused 2 units at presentation with appropriate increase in hemoglobin.  Holding heparin  with  dialysis.  Outpatient ESA dosing not available at this time ESRD on HD MWF at [facility] using thigh AVG: On schedule, next treatment 10/10 Hypertension/volume: Normotensive, not overtly hypervolemic, uses midodrine  with HD.  Trend CKD-BMD: Phosphorus 3.6 and calcium 9.6, stable.  Outpatient management.  No active issues Hypokalemia: Resolved.  Supplemented 10/8.  P HD tomorrow, orders written, goal UF 1 to 2 L; no heparin . Medication Issues; Preferred narcotic agents for pain control are hydromorphone , fentanyl , and methadone. Morphine  should not be used.  Baclofen should be avoided Avoid oral sodium phosphate  and magnesium citrate based laxatives / bowel preps    Bernardino Gasman MD 12/24/2023, 10:03 AM  Recent Labs  Lab 12/23/23 1046 12/23/23 1047 12/24/23 0532  NA 141  --  140  K 3.2*  --  4.4  CL 99  --  101  CO2 31  --  29  GLUCOSE 84  --  77  BUN 10  --  19  CREATININE 2.62*  --  4.13*  CALCIUM 8.7*  --  9.6  PHOS  --  1.7* 3.6   Recent Labs  Lab 12/23/23 1046 12/23/23 2101 12/24/23 0532  WBC 6.9  --  9.8  NEUTROABS 4.4  --   --   HGB 5.3* 9.4* 10.0*  HCT 16.7* 27.6* 30.2*  MCV 85.6  --  83.7  PLT 305  --  303

## 2023-12-24 NOTE — Progress Notes (Signed)
  Pt receives out-pt HD at [facility] dialysis, mwf, 0600 chair time. Clinic informed of pt arrival. Will continue to assist as needed.   Lilyona Richner Dialysis Navigator [phone] [facility] #[phone]

## 2023-12-24 NOTE — [place] FL2 (Signed)
 [place]  MEDICAID FL2 LEVEL OF CARE FORM     IDENTIFICATION  Patient Name: [name] Birthdate: 07-Nov-1975 Sex: female Admission Date (Current Location): 12/23/2023  [place] and [place] Number:  Ray[place]do [id number] Facility and Address:  [facility],  [address], [place] [address]      Provider Number: [id number]  Attending Physician Name and Address:  Darci Pore, MD  Relative Name and Phone Number:  Dewayne Clarity (Legal Guardian) [phone]    Current Level of Care: Hospital Recommended Level of Care: [facility], Other (Comment) (Group Home) Prior Approval Number:    Date Approved/Denied:   PASRR Number:    Discharge Plan: Other (Comment) (Group Home)    Current Diagnoses: Patient Active Problem List   Diagnosis Date Noted   Acute on chronic anemia 12/23/2023   Hypokalemia 12/23/2023   Hypophosphatemia 12/23/2023   Acute GI bleeding 12/08/2023   Upper GI bleed 12/07/2023   Acute anemia 12/07/2023   Encounter for screening fecal occult blood testing 07/01/2022   Encounter for gynecological examination with Papanicolaou smear of cervix 07/01/2022   Hemorrhage from arteriovenous dialysis graft 02/08/2022   Chronic hypotension 02/08/2022   Diabetes (HCC) 11/02/2018   Lipoma of extremity    Hyperkalemia 10/23/2016   Complication of vascular access for dialysis 04/01/2016   Seizure disorder (HCC) 10/01/2015   Generalized muscle weakness 10/01/2015   Vaginal discharge 11/08/2013   Vaginal odor 11/08/2013   BV (bacterial vaginosis) 11/08/2013   ESRD on dialysis (HCC) 12/27/2010   Anemia in chronic kidney disease 12/27/2010   Major depressive disorder with single episode 12/27/2010   Secondary hyperparathyroidism of renal origin 12/27/2010   Systemic lupus erythematosus, organ or system involvement unspecified (HCC) 12/27/2010   Type 2 diabetes mellitus without complication 12/27/2010   SWELLING, NECK  04/02/2007   Asthma 03/15/2007   FOOT PAIN, RIGHT 03/15/2007   Cough 03/15/2007   MENTAL RETARDATION, MODERATE 01/19/2007   STATUS, MENTAL, ALTERED 07/16/2006   ANOREXIA 05/26/2006   Disease of pericardium 05/15/2006   Tachycardia 05/15/2006   Lupus 04/01/2006   ANEMIA-NOS 03/31/2006   LEUKOCYTOSIS 03/31/2006   Depression with anxiety 03/31/2006   Migraine headache 03/31/2006   Hereditary and idiopathic peripheral neuropathy 03/31/2006   Essential (primary) hypertension 03/31/2006   GERD 03/31/2006   Osteoarthritis 03/31/2006   Other specified abnormal findings of blood chemistry 03/31/2006    Orientation RESPIRATION BLADDER Height & Weight     Time, Self, Situation, Place  Normal Continent Weight: 116 lb 13.5 oz (53 kg) Height:  5' 2 (157.5 cm)  BEHAVIORAL SYMPTOMS/MOOD NEUROLOGICAL BOWEL NUTRITION STATUS      Continent Diet (See DC summary)  AMBULATORY STATUS COMMUNICATION OF NEEDS Skin   Supervision Verbally Normal                       Personal Care Assistance Level of Assistance  Bathing, Feeding, Dressing Bathing Assistance: Independent Feeding assistance: Independent Dressing Assistance: Independent     Functional Limitations Info  Sight, Hearing, Speech Sight Info: Impaired Hearing Info: Adequate Speech Info: Adequate    SPECIAL CARE FACTORS FREQUENCY                       Contractures Contractures Info: Not present    Additional Factors Info  Code Status, Allergies, Psychotropic Code Status Info: FULL Allergies Info: Dust Mite Extract Psychotropic Info: Zoloft          Current Medications (12/24/2023):  This is  the current hospital active medication list Current Facility-Administered Medications  Medication Dose Route Frequency Provider Last Rate Last Admin   0.9 %  sodium chloride  infusion   Intravenous Continuous Carlan, Chelsea L, NP 20 mL/hr at 12/23/23 2006 New Bag at 12/23/23 2006   acetaminophen  (TYLENOL ) tablet 650 mg  650 mg  Oral Q6H PRN Darci Pore, MD       Or   acetaminophen  (TYLENOL ) suppository 650 mg  650 mg Rectal Q6H PRN Darci Pore, MD       albuterol  (PROVENTIL ) (2.5 MG/3ML) 0.083% nebulizer solution 2.5 mg  2.5 mg Nebulization Q6H PRN Darci Pore, MD       ALPRAZolam  (XANAX ) tablet 0.25 mg  0.25 mg Oral BID Sreeram, Narendranath, MD   0.25 mg at 12/24/23 0916   calcium carbonate (TUMS - dosed in mg elemental calcium) chewable tablet 400 mg of elemental calcium  1,000 mg Oral BID BM Sreeram, Narendranath, MD   400 mg of elemental calcium at 12/24/23 0917   guaiFENesin  (ROBITUSSIN) 100 MG/5ML liquid 200 mg  200 mg Oral Q6H PRN Darci Pore, MD       HYDROmorphone  (DILAUDID ) injection 0.5 mg  0.5 mg Intravenous Q4H PRN Sreeram, Narendranath, MD       ipratropium (ATROVENT ) nebulizer solution 0.5 mg  0.5 mg Nebulization QID PRN Darci Pore, MD       midodrine  (PROAMATINE ) tablet 10 mg  10 mg Oral BID Sreeram, Narendranath, MD   10 mg at 12/24/23 0915   mirtazapine  (REMERON ) tablet 7.5 mg  7.5 mg Oral QHS Sreeram, Pore, MD   7.5 mg at 12/23/23 2115   multivitamin with minerals tablet 1 tablet  1 tablet Oral Daily Sreeram, Narendranath, MD   1 tablet at 12/24/23 0916   ondansetron  (ZOFRAN ) tablet 4 mg  4 mg Oral Q6H PRN Darci Pore, MD       Or   ondansetron  (ZOFRAN ) injection 4 mg  4 mg Intravenous Q6H PRN Darci Pore, MD       pantoprazole  (PROTONIX ) injection 40 mg  40 mg Intravenous Q24H Sreeram, Narendranath, MD   40 mg at 12/24/23 1020   phenytoin  (DILANTIN ) ER capsule 100 mg  100 mg Oral BID Sreeram, Narendranath, MD   100 mg at 12/24/23 0916   phosphorus (K PHOS NEUTRAL) tablet 500 mg  500 mg Oral Daily Sreeram, Narendranath, MD   500 mg at 12/24/23 0915   sertraline  (ZOLOFT ) tablet 200 mg  200 mg Oral Daily Sreeram, Narendranath, MD   200 mg at 12/24/23 0916   simvastatin  (ZOCOR ) tablet 40 mg  40 mg Oral QHS Sreeram,  Narendranath, MD   40 mg at 12/23/23 2115     Discharge Medications: Please see discharge summary for a list of discharge medications.  Relevant Imaging Results:  Relevant Lab Results:   Additional Information SSN: [id number]  Noreen KATHEE Pinal, LCSWA

## 2023-12-24 NOTE — Progress Notes (Signed)
 Dayshift Nurse reported attempt to call legal guardian [name]) to obtain consent for colonoscopy tomorrow. No Answer. Awaiting call back at this time.

## 2023-12-24 NOTE — H&P (View-Only) (Signed)
 Gastroenterology Progress Note   Referring Provider: No ref. provider found Primary Care Physician:  Gammon, Chrystal, NP Primary Gastroenterologist:  Dr. Eartha  Patient ID: [name]; [id number]; [birth year]   Subjective:    Patient drank 1/2 a cup of prep all night. She did not tolerate the taste. She has not had a BM. She is willing to try miralax  in Gatorade. She denies abdominal pain or vomiting. Nurse at bedside and aware of plan.  Objective:   Vital signs in last 24 hours: Temp:  [97.5 F (36.4 C)-98.7 F (37.1 C)] 97.5 F (36.4 C) (10/08 1947) Pulse Rate:  [85-90] 88 (10/08 1947) Resp:  [1-20] 20 (10/08 1947) BP: (122-138)/(66-75) 122/67 (10/08 1947) SpO2:  [97 %-100 %] 97 % (10/08 1947) Weight:  [53 kg] 53 kg (10/08 1619) Last BM Date : 12/23/23 General:   Alert,  Well-developed, well-nourished, pleasant and cooperative in NAD Head:  Normocephalic and atraumatic. Eyes:  Sclera clear, no icterus.   Abdomen:  Soft, nontender and nondistended. No masses, hepatosplenomegaly or hernias noted. Normal bowel sounds, without guarding, and without rebound.   Extremities:  Without clubbing, deformity or edema. Neurologic:  Alert and  oriented x4;  grossly normal neurologically. Skin:  Intact without significant lesions or rashes. Psych:  Alert and cooperative. Normal mood and affect.  Intake/Output from previous day: 10/08 0701 - 10/09 0700 In: 1403.7 [I.V.:250; Blood:1153.7] Out: -  Intake/Output this shift: No intake/output data recorded.  Lab Results: CBC Recent Labs    12/23/23 1046 12/23/23 2101 12/24/23 0532  WBC 6.9  --  9.8  HGB 5.3* 9.4* 10.0*  HCT 16.7* 27.6* 30.2*  MCV 85.6  --  83.7  PLT 305  --  303   BMET Recent Labs    12/23/23 1046 12/24/23 0532  NA 141 140  K 3.2* 4.4  CL 99 101  CO2 31 29  GLUCOSE 84 77  BUN 10 19  CREATININE 2.62* 4.13*  CALCIUM 8.7* 9.6   LFTs Recent Labs    12/23/23 1046  BILITOT <0.2   ALKPHOS 67  AST 35  ALT 25  PROT 6.2*  ALBUMIN  3.9   No results for input(s): LIPASE in the last 72 hours. PT/INR No results for input(s): LABPROT, INR in the last 72 hours.     Lab Results  Component Value Date   IRON 82 12/23/2023   TIBC 203 (L) 12/23/2023   FERRITIN 763 (H) 12/23/2023   Lab Results  Component Value Date   VITAMINB12 1,056 (H) 12/23/2023   Lab Results  Component Value Date   FOLATE 18.6 12/23/2023     Imaging Studies: DG Chest Port 1 View Result Date: 12/23/2023 EXAM: 1 VIEW(S) XRAY OF THE CHEST 12/23/2023 10:40:00 AM COMPARISON: 12/10/2023 CLINICAL HISTORY: weakness. Pt BIB [facility] from dialysis for c/o low hemoglobin; dialysis states pt's hemoglobin is 5.9; pt was seen here last week for same complaint and received blood; pt is unsure how many; ; Pt denies any sob or pain. Hx of DVT, hypertension. Current everyday smoker FINDINGS: LUNGS AND PLEURA: Interval increase in right pleural effusion which is now moderate in volume compared to small. No focal pulmonary opacity. No pulmonary edema. No pneumothorax. HEART AND MEDIASTINUM: Vascular stents noted. No acute abnormality of the cardiac and mediastinal silhouettes. BONES AND SOFT TISSUES: No acute osseous abnormality. IMPRESSION: 1. Interval increase in right pleural effusion, now moderate in volume compared to small. 2. No focal pulmonary opacity, pulmonary edema, or pneumothorax. Electronically signed  by: Norleen Boxer MD 12/23/2023 11:12 AM EDT RP Workstation: [id number]   DG CHEST PORT 1 VIEW Result Date: 12/10/2023 EXAM: 1 VIEW(S) XRAY OF THE CHEST 12/10/2023 04:30:00 AM COMPARISON: 12/08/2023 CLINICAL HISTORY: COVID-19 virus infection. 3rd SHIFT MORNING ROUTINE follow-up IMAGE timed for 0600 FOR: COVID-19 virus infection. FINDINGS: LINES, TUBES AND DEVICES: There is a stent identified within the left innominate vein. LUNGS AND PLEURA: Persistent diffuse interstitial opacities along with scattered patchy  airspace opacities in the right upper and right lower lung. Unchanged appearance of small to moderate right pleural effusion. No pulmonary edema. No pneumothorax. HEART AND MEDIASTINUM: No acute abnormality of the cardiac and mediastinal silhouettes. BONES AND SOFT TISSUES: Bilateral avascular necrosis identified within the humeral heads. IMPRESSION: 1. Persistent diffuse interstitial opacities which may be seen with pulmonary edema and/or atypical viral infection. 2. Patchy airspace opacities in the right upper and right lower lobes unchanged from prior. 3. Unchanged small to moderate right pleural effusion. Electronically signed by: Waddell Calk MD 12/10/2023 06:07 AM EDT RP Workstation: [id number]   US  Abdomen Limited RUQ (LIVER/GB) Result Date: 12/09/2023 CLINICAL DATA:  Elevated LFTs EXAM: ULTRASOUND ABDOMEN LIMITED RIGHT UPPER QUADRANT COMPARISON:  None Available. FINDINGS: Gallbladder: No gallstones or wall thickening visualized. No sonographic Murphy sign noted by sonographer. Possible small gallbladder polyp measuring up to 0.5 cm versus nonshadowing gallstone along the dependent portion of the gallbladder. Gallbladder is relatively decompressed. Common bile duct: Diameter: 2.0 mm. Liver: No focal lesion identified. Within normal limits in parenchymal echogenicity. Portal vein is patent on color Doppler imaging with normal direction of blood flow towards the liver. Other: None. IMPRESSION: Tiny 0.5 cm gallbladder polyp versus nonshadowing gallstone. No follow-up is indicated if patient has no prior history of malignancy. Electronically Signed   By: Michaeline Blanch M.D.   On: 12/09/2023 15:07   DG CHEST PORT 1 VIEW Result Date: 12/08/2023 CLINICAL DATA:  COVID-19 infection fall EXAM: PORTABLE CHEST 1 VIEW COMPARISON:  January 23, 2022 FINDINGS: Patchy opacity at the right lung base with small to moderate right pleural effusion. Left mid lung and lower lung patchy opacities as well as right midlung  opacity. No pneumothorax. Unchanged cardiomediastinal silhouette. Vascular calcification. Left upper extremity vascular stent. IMPRESSION: Consolidation at the right lung base and additional multifocal opacities bilaterally likely representing multifocal infection. Small to moderate right pleural effusion. Electronically Signed   By: Michaeline Blanch M.D.   On: 12/08/2023 13:49   MM 3D SCREENING MAMMOGRAM BILATERAL BREAST Result Date: 12/07/2023 CLINICAL DATA:  Screening. EXAM: DIGITAL SCREENING BILATERAL MAMMOGRAM WITH TOMOSYNTHESIS AND CAD TECHNIQUE: Bilateral screening digital craniocaudal and mediolateral oblique mammograms were obtained. Bilateral screening digital breast tomosynthesis was performed. The images were evaluated with computer-aided detection. COMPARISON:  Previous exam(s). ACR Breast Density Category d: The breasts are extremely dense, which lowers the sensitivity of mammography. FINDINGS: There are no findings suspicious for malignancy. Limited visualization of the left axilla due to difficulty with patient positioning. IMPRESSION: No mammographic evidence of malignancy. A result letter of this screening mammogram will be mailed directly to the patient. RECOMMENDATION: Screening mammogram in one year. (Code:SM-B-01Y) BI-RADS CATEGORY  1: Negative. Electronically Signed   By: Inocente Ast M.D.   On: 12/07/2023 12:03  [2 weeks]  Assessment:   [name] is a 48 year old female with history of ESRD on dialysis MWF, anemia, depression, DVT, HTN, lupus, with recent admission for anemia who underwent EGD which was unremarkable other than gastritis, now presenting to the ED from  dialysis with significant anemia as outpatient. GI consulted for further evaluation.    Anemia/melena/heme positive stool:  -recurrent significant decline in hemoglobin -no associated GI symptoms -denies NSAID use -hgb 5.3 on arrival, 8.4 on 9/25  -denies any history of GI bleed in the past -BUN is  WNL -EGD last month with gastritis  -no previous colonoscopy -heme positive stool on 9/22 -on plavix , last dose this morning  -she received 2 units of prbcs and her Hgb rose to 9.4 (higher than expected), today Hgb is 10. Likely fluid shifts.    Etiology of acute on chronic anemia is unclear, suspect some aspect of anemia of chronic disease given ESRD, though consider significant decline in hemoglobin over the last 2 weeks and no findings on EGD last month to explain any GI looses, recommending Colonoscopy this admission for evaluation of GI losses in the LGI tract which was discussed with the patient. Patient initially did not agree to colonoscopy but after further discussion with Dr. Eartha yesterday she agreed   She did not prep last night due to taste of prep. She is currently prepping with miralax /gatorade prep but will not complete in time for today.   Plan:   Reschedule colonoscopy for tomorrow. To obtain consent from legal guardian.  Continue miralax  prep. Clear liquids now. Npo after midnight. Tap water  enema tomorrow.     LOS: 1 day   Sonny RAMAN. Ezzard RIGGERS [facility] [phone] 10/9/202512:36 PM

## 2023-12-24 NOTE — Care Management Obs Status (Signed)
 MEDICARE OBSERVATION STATUS NOTIFICATION   Patient Details  Name: [name] MRN: [id number] Date of Birth: 1975/12/14   Medicare Observation Status Notification Given:  Yes    Noreen KATHEE Pinal, LCSWA 12/24/2023, 1:18 PM

## 2023-12-24 NOTE — Progress Notes (Signed)
 The patient drank 0.5 cup of bowel prep the entirety of the shift. This RN continued to encourage her to drink the prep and advised that her colonoscopy would be delayed. She laughed each time and said I'm not drinking that stuff. It's nasty.

## 2023-12-24 NOTE — Plan of Care (Signed)
  Problem: Education: Goal: Knowledge of General Education information will improve Description: Including pain rating scale, medication(s)/side effects and non-pharmacologic comfort measures Outcome: Progressing   Problem: Clinical Measurements: Goal: Cardiovascular complication will be avoided Outcome: Progressing   Problem: Coping: Goal: Level of anxiety will decrease Outcome: Progressing   Problem: Elimination: Goal: Will not experience complications related to bowel motility Outcome: Progressing   Problem: Safety: Goal: Ability to remain free from injury will improve Outcome: Progressing

## 2023-12-24 NOTE — Progress Notes (Signed)
 Progress Note   Patient: [name] FMW:[id number] DOB: Mar 13, 1976 DOA: 12/23/2023     1 DOS: the patient was seen and examined on 12/24/2023   Brief hospital course: [name] is a 48 y.o. female with medical history significant for ESRD on dialysis, diabetes mellitus, DVT, hypertension, seizure, SLE, moderate intellectual disabilities presented to the emergency department for evaluation of low hemoglobin.  Patient had recent EGD which showed gastritis.  GI, nephrology consulted, she is being admitted for further management and evaluation of acute on chronic anemia, possible GI bleed.   Assessment and Plan: Acute on chronic anemia Possible GI bleed/acute blood loss anemia- Presented with hemoglobin of 5.3, baseline around 8.5 S/p 2 units of blood, Hb today 10. Close monitoring of H&H, transfuse for hemoglobin less than 7. Continue to hold Plavix , no heparin  for DVT.  Continue IV PPI. Patient had recent GI bleed, EGD showed gastritis no active bleeding. Anemia panel of high ferritin shows anemia of ESRD. GI follow up appreciated. Bowel prep for colonoscopy as per GI for tomorrow.   ESRD on hemodialysis: Nephrology on board for hemodialysis needs. Correction of K with HD.   Hypokalemia: Improved with oral repletion. Further correction with dialysis.   Hypophosphatemia: Phos improved with K-Phos dose.   Hypertension: Not on hypertensives Continue midodrine  home dose.   Seizure disorder- Continue Dilantin  therapy.   COVID-positive 12/07/2023- No symptoms of COVID. No need for isolation as it is more than 10 days.      Out of bed to chair. Incentive spirometry. Nursing supportive care. Fall, aspiration precautions. Diet:  Diet Orders (From admission, onward)     Start     Ordered   12/25/23 0001  Diet NPO time specified Except for: Sips with Meds  Diet effective midnight       Question:  Except for  Answer:  Sips with Meds   12/24/23 1245   12/24/23  1244  Diet clear liquid Fluid consistency: Thin  Diet effective now       Question:  Fluid consistency:  Answer:  Thin   12/24/23 1243           DVT prophylaxis: SCDs Start: 12/23/23 1157  Level of care: Telemetry   Code Status: Full Code  Subjective: Patient is seen and examined today morning. She is lying comfortably. BP elevated. Denies any complaints. No active bleeding.  Physical Exam: Vitals:   12/23/23 1629 12/23/23 1934 12/23/23 1947 12/24/23 1255  BP: 138/74 128/75 122/67 (!) 148/78  Pulse: 86 86 88 82  Resp:   20 17  Temp: 98.5 F (36.9 C) 98.7 F (37.1 C) (!) 97.5 F (36.4 C) 98 F (36.7 C)  TempSrc: Oral Oral Oral   SpO2: 97% 99% 97% 98%  Weight:      Height:        General - Middle aged African American female, no apparent distress HEENT - PERRLA, EOMI, atraumatic head, non tender sinuses. Lung - Clear, basal rales, no rhonchi, wheezes. Heart - S1, S2 heard, no murmurs, rubs, trace pedal edema. Abdomen - Soft, non tender, bowel sounds good Neuro - Alert, awake and oriented, non focal exam. Skin - Warm and dry.  Data Reviewed:      Latest Ref Rng & Units 12/24/2023    5:32 AM 12/23/2023    9:01 PM 12/23/2023   10:46 AM  CBC  WBC 4.0 - 10.5 K/uL 9.8   6.9   Hemoglobin 12.0 - 15.0 g/dL 89.9  9.4  5.3  Hematocrit 36.0 - 46.0 % 30.2  27.6  16.7   Platelets 150 - 400 K/uL 303   305       Latest Ref Rng & Units 12/24/2023    5:32 AM 12/23/2023   10:46 AM 12/10/2023    4:34 AM  BMP  Glucose 70 - 99 mg/dL 77  84  86   BUN 6 - 20 mg/dL 19  10  21    Creatinine 0.44 - 1.00 mg/dL 5.86  7.37  5.27   Sodium 135 - 145 mmol/L 140  141  134   Potassium 3.5 - 5.1 mmol/L 4.4  3.2  3.6   Chloride 98 - 111 mmol/L 101  99  95   CO2 22 - 32 mmol/L 29  31  27    Calcium 8.9 - 10.3 mg/dL 9.6  8.7  7.9    DG Chest Port 1 View Result Date: 12/23/2023 EXAM: 1 VIEW(S) XRAY OF THE CHEST 12/23/2023 10:40:00 AM COMPARISON: 12/10/2023 CLINICAL HISTORY: weakness. Pt BIB  [facility] from dialysis for c/o low hemoglobin; dialysis states pt's hemoglobin is 5.9; pt was seen here last week for same complaint and received blood; pt is unsure how many; ; Pt denies any sob or pain. Hx of DVT, hypertension. Current everyday smoker FINDINGS: LUNGS AND PLEURA: Interval increase in right pleural effusion which is now moderate in volume compared to small. No focal pulmonary opacity. No pulmonary edema. No pneumothorax. HEART AND MEDIASTINUM: Vascular stents noted. No acute abnormality of the cardiac and mediastinal silhouettes. BONES AND SOFT TISSUES: No acute osseous abnormality. IMPRESSION: 1. Interval increase in right pleural effusion, now moderate in volume compared to small. 2. No focal pulmonary opacity, pulmonary edema, or pneumothorax. Electronically signed by: Norleen Boxer MD 12/23/2023 11:12 AM EDT RP Workstation: [id number]    Family Communication: Discussed with patient, understand and agree. All questions answered.  Disposition: Status is: Observation The patient remains OBS appropriate and will d/c before 2 midnights.  Planned Discharge Destination: Home     Time spent: 44 minutes  Author: Concepcion Riser, MD 12/24/2023 1:38 PM Secure chat 7am to 7pm For on call review [email or web address].

## 2023-12-24 NOTE — Care Management CC44 (Signed)
 Condition Code 44 Documentation Completed  Patient Details  Name: [name] MRN: [id number] Date of Birth: December 09, 1975   Condition Code 44 given:  Yes Patient signature on Condition Code 44 notice:  Yes Documentation of 2 MD's agreement:  Yes Code 44 added to claim:  Yes    Noreen KATHEE Pinal, LCSWA 12/24/2023, 1:19 PM

## 2023-12-24 NOTE — Progress Notes (Signed)
 Gastroenterology Progress Note   Referring Provider: No ref. provider found Primary Care Physician:  Gammon, Chrystal, NP Primary Gastroenterologist:  Dr. Eartha  Patient ID: [name]; [id number]; [birth year]   Subjective:    Patient drank 1/2 a cup of prep all night. She did not tolerate the taste. She has not had a BM. She is willing to try miralax  in Gatorade. She denies abdominal pain or vomiting. Nurse at bedside and aware of plan.  Objective:   Vital signs in last 24 hours: Temp:  [97.5 F (36.4 C)-98.7 F (37.1 C)] 97.5 F (36.4 C) (10/08 1947) Pulse Rate:  [85-90] 88 (10/08 1947) Resp:  [1-20] 20 (10/08 1947) BP: (122-138)/(66-75) 122/67 (10/08 1947) SpO2:  [97 %-100 %] 97 % (10/08 1947) Weight:  [53 kg] 53 kg (10/08 1619) Last BM Date : 12/23/23 General:   Alert,  Well-developed, well-nourished, pleasant and cooperative in NAD Head:  Normocephalic and atraumatic. Eyes:  Sclera clear, no icterus.   Abdomen:  Soft, nontender and nondistended. No masses, hepatosplenomegaly or hernias noted. Normal bowel sounds, without guarding, and without rebound.   Extremities:  Without clubbing, deformity or edema. Neurologic:  Alert and  oriented x4;  grossly normal neurologically. Skin:  Intact without significant lesions or rashes. Psych:  Alert and cooperative. Normal mood and affect.  Intake/Output from previous day: 10/08 0701 - 10/09 0700 In: 1403.7 [I.V.:250; Blood:1153.7] Out: -  Intake/Output this shift: No intake/output data recorded.  Lab Results: CBC Recent Labs    12/23/23 1046 12/23/23 2101 12/24/23 0532  WBC 6.9  --  9.8  HGB 5.3* 9.4* 10.0*  HCT 16.7* 27.6* 30.2*  MCV 85.6  --  83.7  PLT 305  --  303   BMET Recent Labs    12/23/23 1046 12/24/23 0532  NA 141 140  K 3.2* 4.4  CL 99 101  CO2 31 29  GLUCOSE 84 77  BUN 10 19  CREATININE 2.62* 4.13*  CALCIUM 8.7* 9.6   LFTs Recent Labs    12/23/23 1046  BILITOT <0.2   ALKPHOS 67  AST 35  ALT 25  PROT 6.2*  ALBUMIN  3.9   No results for input(s): LIPASE in the last 72 hours. PT/INR No results for input(s): LABPROT, INR in the last 72 hours.     Lab Results  Component Value Date   IRON 82 12/23/2023   TIBC 203 (L) 12/23/2023   FERRITIN 763 (H) 12/23/2023   Lab Results  Component Value Date   VITAMINB12 1,056 (H) 12/23/2023   Lab Results  Component Value Date   FOLATE 18.6 12/23/2023     Imaging Studies: DG Chest Port 1 View Result Date: 12/23/2023 EXAM: 1 VIEW(S) XRAY OF THE CHEST 12/23/2023 10:40:00 AM COMPARISON: 12/10/2023 CLINICAL HISTORY: weakness. Pt BIB [facility] from dialysis for c/o low hemoglobin; dialysis states pt's hemoglobin is 5.9; pt was seen here last week for same complaint and received blood; pt is unsure how many; ; Pt denies any sob or pain. Hx of DVT, hypertension. Current everyday smoker FINDINGS: LUNGS AND PLEURA: Interval increase in right pleural effusion which is now moderate in volume compared to small. No focal pulmonary opacity. No pulmonary edema. No pneumothorax. HEART AND MEDIASTINUM: Vascular stents noted. No acute abnormality of the cardiac and mediastinal silhouettes. BONES AND SOFT TISSUES: No acute osseous abnormality. IMPRESSION: 1. Interval increase in right pleural effusion, now moderate in volume compared to small. 2. No focal pulmonary opacity, pulmonary edema, or pneumothorax. Electronically signed  by: Norleen Boxer MD 12/23/2023 11:12 AM EDT RP Workstation: [id number]   DG CHEST PORT 1 VIEW Result Date: 12/10/2023 EXAM: 1 VIEW(S) XRAY OF THE CHEST 12/10/2023 04:30:00 AM COMPARISON: 12/08/2023 CLINICAL HISTORY: COVID-19 virus infection. 3rd SHIFT MORNING ROUTINE follow-up IMAGE timed for 0600 FOR: COVID-19 virus infection. FINDINGS: LINES, TUBES AND DEVICES: There is a stent identified within the left innominate vein. LUNGS AND PLEURA: Persistent diffuse interstitial opacities along with scattered patchy  airspace opacities in the right upper and right lower lung. Unchanged appearance of small to moderate right pleural effusion. No pulmonary edema. No pneumothorax. HEART AND MEDIASTINUM: No acute abnormality of the cardiac and mediastinal silhouettes. BONES AND SOFT TISSUES: Bilateral avascular necrosis identified within the humeral heads. IMPRESSION: 1. Persistent diffuse interstitial opacities which may be seen with pulmonary edema and/or atypical viral infection. 2. Patchy airspace opacities in the right upper and right lower lobes unchanged from prior. 3. Unchanged small to moderate right pleural effusion. Electronically signed by: Waddell Calk MD 12/10/2023 06:07 AM EDT RP Workstation: [id number]   US  Abdomen Limited RUQ (LIVER/GB) Result Date: 12/09/2023 CLINICAL DATA:  Elevated LFTs EXAM: ULTRASOUND ABDOMEN LIMITED RIGHT UPPER QUADRANT COMPARISON:  None Available. FINDINGS: Gallbladder: No gallstones or wall thickening visualized. No sonographic Murphy sign noted by sonographer. Possible small gallbladder polyp measuring up to 0.5 cm versus nonshadowing gallstone along the dependent portion of the gallbladder. Gallbladder is relatively decompressed. Common bile duct: Diameter: 2.0 mm. Liver: No focal lesion identified. Within normal limits in parenchymal echogenicity. Portal vein is patent on color Doppler imaging with normal direction of blood flow towards the liver. Other: None. IMPRESSION: Tiny 0.5 cm gallbladder polyp versus nonshadowing gallstone. No follow-up is indicated if patient has no prior history of malignancy. Electronically Signed   By: Michaeline Blanch M.D.   On: 12/09/2023 15:07   DG CHEST PORT 1 VIEW Result Date: 12/08/2023 CLINICAL DATA:  COVID-19 infection fall EXAM: PORTABLE CHEST 1 VIEW COMPARISON:  January 23, 2022 FINDINGS: Patchy opacity at the right lung base with small to moderate right pleural effusion. Left mid lung and lower lung patchy opacities as well as right midlung  opacity. No pneumothorax. Unchanged cardiomediastinal silhouette. Vascular calcification. Left upper extremity vascular stent. IMPRESSION: Consolidation at the right lung base and additional multifocal opacities bilaterally likely representing multifocal infection. Small to moderate right pleural effusion. Electronically Signed   By: Michaeline Blanch M.D.   On: 12/08/2023 13:49   MM 3D SCREENING MAMMOGRAM BILATERAL BREAST Result Date: 12/07/2023 CLINICAL DATA:  Screening. EXAM: DIGITAL SCREENING BILATERAL MAMMOGRAM WITH TOMOSYNTHESIS AND CAD TECHNIQUE: Bilateral screening digital craniocaudal and mediolateral oblique mammograms were obtained. Bilateral screening digital breast tomosynthesis was performed. The images were evaluated with computer-aided detection. COMPARISON:  Previous exam(s). ACR Breast Density Category d: The breasts are extremely dense, which lowers the sensitivity of mammography. FINDINGS: There are no findings suspicious for malignancy. Limited visualization of the left axilla due to difficulty with patient positioning. IMPRESSION: No mammographic evidence of malignancy. A result letter of this screening mammogram will be mailed directly to the patient. RECOMMENDATION: Screening mammogram in one year. (Code:SM-B-01Y) BI-RADS CATEGORY  1: Negative. Electronically Signed   By: Inocente Ast M.D.   On: 12/07/2023 12:03  [2 weeks]  Assessment:   [name] is a 48 year old female with history of ESRD on dialysis MWF, anemia, depression, DVT, HTN, lupus, with recent admission for anemia who underwent EGD which was unremarkable other than gastritis, now presenting to the ED from  dialysis with significant anemia as outpatient. GI consulted for further evaluation.    Anemia/melena/heme positive stool:  -recurrent significant decline in hemoglobin -no associated GI symptoms -denies NSAID use -hgb 5.3 on arrival, 8.4 on 9/25  -denies any history of GI bleed in the past -BUN is  WNL -EGD last month with gastritis  -no previous colonoscopy -heme positive stool on 9/22 -on plavix , last dose this morning  -she received 2 units of prbcs and her Hgb rose to 9.4 (higher than expected), today Hgb is 10. Likely fluid shifts.    Etiology of acute on chronic anemia is unclear, suspect some aspect of anemia of chronic disease given ESRD, though consider significant decline in hemoglobin over the last 2 weeks and no findings on EGD last month to explain any GI looses, recommending Colonoscopy this admission for evaluation of GI losses in the LGI tract which was discussed with the patient. Patient initially did not agree to colonoscopy but after further discussion with Dr. Eartha yesterday she agreed   She did not prep last night due to taste of prep. She is currently prepping with miralax /gatorade prep but will not complete in time for today.   Plan:   Reschedule colonoscopy for tomorrow. To obtain consent from legal guardian.  Continue miralax  prep. Clear liquids now. Npo after midnight. Tap water  enema tomorrow.     LOS: 1 day   Sonny RAMAN. Ezzard RIGGERS [facility] [phone] 10/9/202512:36 PM

## 2023-12-24 NOTE — TOC Initial Note (Addendum)
 Transition of Care [facility]) - Initial/Assessment Note    Patient Details  Name: [name] MRN: [id number] Date of Birth: [birth year]  Transition of Care [facility]) CM/SW Contact:    [name], LCSWA Phone Number: 12/24/2023, 12:21 PM  Clinical Narrative:                  CSW gave patient [name] a call and assess patient since she came from a Group Home call [facility]. Patient has been there for years per [name], and for equipment has a rollator and possibly a WC. [name] reports that staff does assist with patient needs and the plans are for her to return back, once stable . CSW will continue to follow.   Addendum 2:41 pm   CSW spoke with [facility] manager with [facility] and shared with her that patient should be ready to DC tomorrow. Patient is able to return back to Group Home. CSW asked [name] about what she would need, and she confirmed saying, FL2 and DC summary. Fax: [phone].  Expected Discharge Plan: Group Home Barriers to Discharge: Continued Medical Work up   Patient Goals and CMS Choice Patient states their goals for this hospitalization and ongoing recovery are:: return back to group home CMS Medicare.gov Compare Post Acute Care list provided to:: Legal Guardian Choice offered to / list presented to : [facility] POA / Guardian      Expected Discharge Plan and Services In-house Referral: Clinical Social Work Discharge Planning Services: NA Post Acute Care Choice: Durable Medical Equipment Living arrangements for the past 2 months: Group Home                                      Prior Living Arrangements/Services Living arrangements for the past 2 months: Group Home Lives with:: Facility Resident Patient language and need for interpreter reviewed:: Yes Do you feel safe going back to the place where you live?: Yes      Need for Family Participation in Patient Care: Yes (Comment) Care giver support system in place?: Yes  (comment) Current home services: DME Criminal Activity/Legal Involvement Pertinent to Current Situation/Hospitalization: No - Comment as needed  Activities of Daily Living   ADL Screening (condition at time of admission) Independently performs ADLs?: Yes (appropriate for developmental age) Is the patient deaf or have difficulty hearing?: No Does the patient have difficulty seeing, even when wearing glasses/contacts?: No Does the patient have difficulty concentrating, remembering, or making decisions?: No  Permission Sought/Granted      Share Information with NAME: [name]     Permission granted to share info w Relationship: [name]     Emotional Assessment Appearance:: Appears stated age Attitude/Demeanor/Rapport: Unable to Assess Affect (typically observed): Unable to Assess Orientation: : Oriented to Self, Oriented to Place, Oriented to Situation, Oriented to  Time Alcohol  / Substance Use: Tobacco Use Psych Involvement: No (comment)  Admission diagnosis:  Hypokalemia [E87.6] Anemia, unspecified type [D64.9] Chronic kidney disease, unspecified CKD stage [N18.9] Acute on chronic anemia [D64.9] Patient Active Problem List   Diagnosis Date Noted   Acute on chronic anemia 12/23/2023   Hypokalemia 12/23/2023   Hypophosphatemia 12/23/2023   Acute GI bleeding 12/08/2023   Upper GI bleed 12/07/2023   Acute anemia 12/07/2023   Encounter for screening fecal occult blood testing 07/01/2022   Encounter for gynecological examination with Papanicolaou smear of cervix 07/01/2022  Hemorrhage from arteriovenous dialysis graft 02/08/2022   Chronic hypotension 02/08/2022   Diabetes (HCC) 11/02/2018   Lipoma of extremity    Hyperkalemia 10/23/2016   Complication of vascular access for dialysis 04/01/2016   Seizure disorder (HCC) 10/01/2015   Generalized muscle weakness 10/01/2015   Vaginal discharge 11/08/2013   Vaginal odor 11/08/2013   BV (bacterial vaginosis) 11/08/2013   ESRD on  dialysis (HCC) 12/27/2010   Anemia in chronic kidney disease 12/27/2010   Major depressive disorder with single episode 12/27/2010   Secondary hyperparathyroidism of renal origin 12/27/2010   Systemic lupus erythematosus, organ or system involvement unspecified (HCC) 12/27/2010   Type 2 diabetes mellitus without complication 12/27/2010   SWELLING, NECK 04/02/2007   Asthma 03/15/2007   FOOT PAIN, RIGHT 03/15/2007   Cough 03/15/2007   MENTAL RETARDATION, MODERATE 01/19/2007   STATUS, MENTAL, ALTERED 07/16/2006   ANOREXIA 05/26/2006   Disease of pericardium 05/15/2006   Tachycardia 05/15/2006   Lupus 04/01/2006   ANEMIA-NOS 03/31/2006   LEUKOCYTOSIS 03/31/2006   Depression with anxiety 03/31/2006   Migraine headache 03/31/2006   Hereditary and idiopathic peripheral neuropathy 03/31/2006   Essential (primary) hypertension 03/31/2006   GERD 03/31/2006   Osteoarthritis 03/31/2006   Other specified abnormal findings of blood chemistry 03/31/2006   PCP:  Erica Penton, NP Pharmacy:   [name], [place] - [address] [place] [address] Phone: [phone] Fax: [phone]     Social Drivers of Health (SDOH) Social History: SDOH Screenings   Food Insecurity: No Food Insecurity (12/23/2023)  Housing: Low Risk  (12/23/2023)  Transportation Needs: No Transportation Needs (12/23/2023)  Utilities: Not At Risk (12/23/2023)  Alcohol  Screen: Low Risk  (07/01/2022)  Depression (PHQ2-9): Low Risk  (07/01/2022)  Financial Resource Strain: Low Risk  (07/01/2022)  Physical Activity: Inactive (07/01/2022)  Social Connections: Moderately Isolated (12/23/2023)  Stress: No Stress Concern Present (07/01/2022)  Tobacco Use: High Risk (12/24/2023)   SDOH Interventions:     Readmission Risk Interventions    12/24/2023   12:18 PM 12/10/2023    1:07 PM  Readmission Risk Prevention Plan  Transportation Screening Complete Complete  PCP or Specialist Appt within 5-7  Days  Complete  Home Care Screening Complete Complete  Medication Review (RN CM) Complete Complete

## 2023-12-25 ENCOUNTER — Observation Stay (HOSPITAL_COMMUNITY): Admitting: Certified Registered Nurse Anesthetist

## 2023-12-25 ENCOUNTER — Encounter (HOSPITAL_COMMUNITY): Payer: Self-pay | Admitting: Internal Medicine

## 2023-12-25 ENCOUNTER — Encounter (HOSPITAL_COMMUNITY)
Admission: EM | Disposition: A | Discharge status: Skilled Nursing Facility | Payer: Self-pay | Attending: Internal Medicine

## 2023-12-25 DIAGNOSIS — I10 Essential (primary) hypertension: Secondary | ICD-10-CM | POA: Diagnosis not present

## 2023-12-25 DIAGNOSIS — F71 Moderate intellectual disabilities: Secondary | ICD-10-CM | POA: Diagnosis present

## 2023-12-25 DIAGNOSIS — Z79899 Other long term (current) drug therapy: Secondary | ICD-10-CM | POA: Diagnosis not present

## 2023-12-25 DIAGNOSIS — F419 Anxiety disorder, unspecified: Secondary | ICD-10-CM | POA: Diagnosis present

## 2023-12-25 DIAGNOSIS — D649 Anemia, unspecified: Secondary | ICD-10-CM | POA: Diagnosis not present

## 2023-12-25 DIAGNOSIS — I120 Hypertensive chronic kidney disease with stage 5 chronic kidney disease or end stage renal disease: Secondary | ICD-10-CM | POA: Diagnosis present

## 2023-12-25 DIAGNOSIS — Z8269 Family history of other diseases of the musculoskeletal system and connective tissue: Secondary | ICD-10-CM | POA: Diagnosis not present

## 2023-12-25 DIAGNOSIS — K648 Other hemorrhoids: Secondary | ICD-10-CM | POA: Diagnosis present

## 2023-12-25 DIAGNOSIS — K5521 Angiodysplasia of colon with hemorrhage: Secondary | ICD-10-CM

## 2023-12-25 DIAGNOSIS — F17210 Nicotine dependence, cigarettes, uncomplicated: Secondary | ICD-10-CM | POA: Diagnosis present

## 2023-12-25 DIAGNOSIS — D631 Anemia in chronic kidney disease: Secondary | ICD-10-CM | POA: Diagnosis present

## 2023-12-25 DIAGNOSIS — F418 Other specified anxiety disorders: Secondary | ICD-10-CM | POA: Diagnosis not present

## 2023-12-25 DIAGNOSIS — N186 End stage renal disease: Secondary | ICD-10-CM | POA: Diagnosis present

## 2023-12-25 DIAGNOSIS — M3210 Systemic lupus erythematosus, organ or system involvement unspecified: Secondary | ICD-10-CM | POA: Diagnosis present

## 2023-12-25 DIAGNOSIS — G40909 Epilepsy, unspecified, not intractable, without status epilepticus: Secondary | ICD-10-CM | POA: Diagnosis present

## 2023-12-25 DIAGNOSIS — N2581 Secondary hyperparathyroidism of renal origin: Secondary | ICD-10-CM | POA: Diagnosis present

## 2023-12-25 DIAGNOSIS — Z992 Dependence on renal dialysis: Secondary | ICD-10-CM | POA: Diagnosis not present

## 2023-12-25 DIAGNOSIS — E876 Hypokalemia: Secondary | ICD-10-CM | POA: Diagnosis present

## 2023-12-25 DIAGNOSIS — Z8616 Personal history of COVID-19: Secondary | ICD-10-CM | POA: Diagnosis not present

## 2023-12-25 DIAGNOSIS — Z7902 Long term (current) use of antithrombotics/antiplatelets: Secondary | ICD-10-CM | POA: Diagnosis not present

## 2023-12-25 DIAGNOSIS — F32A Depression, unspecified: Secondary | ICD-10-CM | POA: Diagnosis present

## 2023-12-25 DIAGNOSIS — D62 Acute posthemorrhagic anemia: Secondary | ICD-10-CM | POA: Diagnosis present

## 2023-12-25 HISTORY — PX: COLONOSCOPY: SHX5424

## 2023-12-25 LAB — RENAL FUNCTION PANEL
Albumin: 3.6 g/dL (ref 3.5–5.0)
Anion gap: 14 (ref 5–15)
BUN: 24 mg/dL — ABNORMAL HIGH (ref 6–20)
CO2: 26 mmol/L (ref 22–32)
Calcium: 9.4 mg/dL (ref 8.9–10.3)
Chloride: 99 mmol/L (ref 98–111)
Creatinine, Ser: 5.8 mg/dL — ABNORMAL HIGH (ref 0.44–1.00)
GFR, Estimated: 8 mL/min — ABNORMAL LOW (ref >60)
Glucose, Bld: 80 mg/dL (ref 70–99)
Phosphorus: 3.9 mg/dL (ref 2.5–4.6)
Potassium: 4.1 mmol/L (ref 3.5–5.1)
Sodium: 139 mmol/L (ref 135–145)

## 2023-12-25 LAB — HCG, QUANTITATIVE, PREGNANCY: hCG, Beta Chain, Quant, S: 12 m[IU]/mL — ABNORMAL HIGH (ref <5)

## 2023-12-25 LAB — CBC
HCT: 28.2 % — ABNORMAL LOW (ref 36.0–46.0)
Hemoglobin: 9.4 g/dL — ABNORMAL LOW (ref 12.0–15.0)
MCH: 28.3 pg (ref 26.0–34.0)
MCHC: 33.3 g/dL (ref 30.0–36.0)
MCV: 84.9 fL (ref 80.0–100.0)
Platelets: 241 K/uL (ref 150–400)
RBC: 3.32 MIL/uL — ABNORMAL LOW (ref 3.87–5.11)
RDW: 20.2 % — ABNORMAL HIGH (ref 11.5–15.5)
WBC: 8.7 K/uL (ref 4.0–10.5)
nRBC: 0 % (ref 0.0–0.2)

## 2023-12-25 LAB — HEMOGLOBIN AND HEMATOCRIT, BLOOD
HCT: 28.9 % — ABNORMAL LOW (ref 36.0–46.0)
Hemoglobin: 9.6 g/dL — ABNORMAL LOW (ref 12.0–15.0)

## 2023-12-25 LAB — HEPATITIS B SURFACE ANTIBODY, QUANTITATIVE: Hep B S AB Quant (Post): 116 m[IU]/mL

## 2023-12-25 SURGERY — COLONOSCOPY
Anesthesia: General

## 2023-12-25 MED ORDER — SODIUM CHLORIDE 0.9 % IV SOLN: INTRAVENOUS | Status: DC | PRN

## 2023-12-25 MED ORDER — ALTEPLASE 2 MG IJ SOLR: 2.0000 mg | Freq: Once | INTRAMUSCULAR | Status: DC | PRN

## 2023-12-25 MED ORDER — LACTATED RINGERS IV SOLN: INTRAVENOUS | Status: DC

## 2023-12-25 MED ORDER — PROPOFOL 10 MG/ML IV BOLUS
INTRAVENOUS | Status: DC | PRN
  Administered 2023-12-25: 50 mg via INTRAVENOUS
  Administered 2023-12-25: 100 ug/kg/min via INTRAVENOUS
  Administered 2023-12-25: 30 mg via INTRAVENOUS

## 2023-12-25 MED ORDER — LIDOCAINE HCL (PF) 1 % IJ SOLN: 5.0000 mL | INTRAMUSCULAR | Status: DC | PRN

## 2023-12-25 MED ORDER — PENTAFLUOROPROP-TETRAFLUOROETH EX AERO
1.0000 | INHALATION_SPRAY | CUTANEOUS | Status: DC | PRN
Start: 2023-12-25 — End: 2023-12-25
  Administered 2023-12-25: 1 via TOPICAL
  Filled 2023-12-25: qty 30

## 2023-12-25 MED ORDER — HEPARIN SODIUM (PORCINE) 1000 UNIT/ML DIALYSIS: 1000.0000 [IU] | INTRAMUSCULAR | Status: DC | PRN

## 2023-12-25 MED ORDER — LIDOCAINE-PRILOCAINE 2.5-2.5 % EX CREA: 1.0000 | TOPICAL_CREAM | CUTANEOUS | Status: DC | PRN

## 2023-12-25 MED ORDER — PENTAFLUOROPROP-TETRAFLUOROETH EX AERO: 1.0000 | INHALATION_SPRAY | CUTANEOUS | Status: DC | PRN

## 2023-12-25 NOTE — Procedures (Signed)
 Received patient in bed to unit.  Alert and oriented.  Informed consent signed and in chart.  L thigh AVG cannulated x 2 with 15 g needles per policy, without difficulty. Tx initiated per MD order. UF goal 2000 ml, as tolerated  TX duration:3 hours  Tx complete. Blood returned. Needles removed. Sites held x 2 until hemostasis achieved. Gauze changed prior to taping.  Patient tolerated well.  Transported back to the room  Alert, without acute distress.  Hand-off given to patient's nurse.   Access used: L thigh AVG Access issues: none  Total UF removed: 2000 ml Medication(s) given: See MAR   [name]

## 2023-12-25 NOTE — Op Note (Signed)
 [facility] Patient Name: [name] Procedure Date: 12/25/2023 1:46 PM MRN: [id number] Date of Birth: 01-14-1976 Attending MD: Carlin POUR. Cindie , [place], [id number] CSN: [id number] Age: 48 Admit Type: Inpatient Procedure:                Colonoscopy Indications:              Rectal bleeding, Acute post hemorrhagic anemia Providers:                Carlin POUR. Cindie, DO, Harlene Lips, Bascom Blush Referring MD:              Medicines:                See the Anesthesia note for documentation of the                            administered medications Complications:            No immediate complications. Estimated Blood Loss:     Estimated blood loss was minimal. Procedure:                Pre-Anesthesia Assessment:                           - The anesthesia plan was to use monitored                            anesthesia care (MAC).                           After obtaining informed consent, the colonoscope                            was passed under direct vision. Throughout the                            procedure, the patient's blood pressure, pulse, and                            oxygen saturations were monitored continuously. The                            [id number]-HQ190L ([id number]) Peds Colon was introduced                            through the anus and advanced to the the cecum,                            identified by appendiceal orifice and ileocecal                            valve. The colonoscopy was performed without                            difficulty. The patient tolerated the procedure  well. The quality of the bowel preparation was                            evaluated using the BBPS [facility] Bowel Preparation                            Scale) with scores of: Right Colon = 2 (minor                            amount of residual staining, small fragments of                            stool and/or opaque liquid,  but mucosa seen well),                            Transverse Colon = 2 (minor amount of residual                            staining, small fragments of stool and/or opaque                            liquid, but mucosa seen well) and Left Colon = 2                            (minor amount of residual staining, small fragments                            of stool and/or opaque liquid, but mucosa seen                            well). The total BBPS score equals 6. The quality                            of the bowel preparation was good. Scope In: 2:36:16 PM Scope Out: 3:07:10 PM Scope Withdrawal Time: 0 hours 25 minutes 8 seconds  Total Procedure Duration: 0 hours 30 minutes 54 seconds  Findings:      Non-bleeding internal hemorrhoids were found during retroflexion.      A single small angiodysplastic lesion with stigmata of recent bleeding       was found in the transverse colon. Coagulation for hemostasis using       argon plasma was successful.      Four small angiodysplastic lesions without bleeding were found in the       descending colon and in the transverse colon. Coagulation for bleeding       prevention using argon plasma was successful.      A single lesion with bleeding was found in the sigmoid colon. ?Dieulafoy       vs small ulcer. For hemostasis, one hemostatic clip was successfully       placed (MR safe). Clip manufacturer: [employer]. There was no       bleeding at the end of the procedure. Impression:               - Non-bleeding internal hemorrhoids.                           -  A single recently bleeding colonic                            angiodysplastic lesion. Treated with argon plasma                            coagulation (APC).                           - Four non-bleeding colonic angiodysplastic                            lesions. Treated with argon plasma coagulation                            (APC).                           - A single bleeding colonic  angiodysplastic lesion.                            Clip manufacturer: [employer]. Clip (MR                            safe) was placed.                           - No specimens collected. Moderate Sedation:      Per Anesthesia Care Recommendation:           - Return patient to hospital ward for ongoing care.                           - Resume regular diet.                           - Okay to resume Plavix  in 2 days Procedure Code(s):        --- Professional ---                           [phone], Colonoscopy, flexible; with control of                            bleeding, any method Diagnosis Code(s):        --- Professional ---                           K55.21, Angiodysplasia of colon with hemorrhage                           K64.8, Other hemorrhoids                           K62.5, Hemorrhage of anus and rectum                           D62, Acute posthemorrhagic anemia CPT copyright 2022 American Medical Association. All rights reserved. The codes  documented in this report are preliminary and upon coder review may  be revised to meet current compliance requirements. Carlin POUR. Cindie, DO Carlin POUR. Tulip Meharg, DO 12/25/2023 3:17:10 PM This report has been signed electronically. Number of Addenda: 0

## 2023-12-25 NOTE — Anesthesia Preprocedure Evaluation (Addendum)
 Anesthesia Evaluation  Patient identified by MRN, date of birth, ID band Patient confused    Reviewed: Allergy & Precautions, H&P , NPO status , Patient's Chart, lab work & pertinent test results  Airway Mallampati: II  TM Distance: >3 FB Neck ROM: Full    Dental  (+) Missing, Poor Dentition   Pulmonary asthma , Current Smoker and Patient abstained from smoking.   Pulmonary exam normal breath sounds clear to auscultation       Cardiovascular hypertension, Normal cardiovascular exam Rhythm:Regular Rate:Normal     Neuro/Psych  Headaches, Seizures -,  PSYCHIATRIC DISORDERS Anxiety Depression     Neuromuscular disease    GI/Hepatic Neg liver ROS,GERD  ,,  Endo/Other  diabetes    Renal/GU Dialysis and ESRFRenal diseaseDialysis yesterday  negative genitourinary   Musculoskeletal  (+) Arthritis ,    Abdominal   Peds negative pediatric ROS (+)  Hematology  (+) Blood dyscrasia, anemia   Anesthesia Other Findings   Reproductive/Obstetrics negative OB ROS                              Anesthesia Physical Anesthesia Plan  ASA: 4 and emergent  Anesthesia Plan: General   Post-op Pain Management:    Induction: Intravenous  PONV Risk Score and Plan:   Airway Management Planned: Nasal Cannula  Additional Equipment:   Intra-op Plan:   Post-operative Plan:   Informed Consent: I have reviewed the patients History and Physical, chart, labs and discussed the procedure including the risks, benefits and alternatives for the proposed anesthesia with the patient or authorized representative who has indicated his/her understanding and acceptance.     Dental advisory given  Plan Discussed with: CRNA  Anesthesia Plan Comments: (Hcg came back at 12 Pt unable to sign consent form or answer questions about possible pregnancy Caregiver not available Risks of not doing procedure outweigh potential  risks)         Anesthesia Quick Evaluation

## 2023-12-25 NOTE — Plan of Care (Signed)
  Problem: Education: Goal: Knowledge of General Education information will improve Description: Including pain rating scale, medication(s)/side effects and non-pharmacologic comfort measures Outcome: Progressing   Problem: Clinical Measurements: Goal: Cardiovascular complication will be avoided Outcome: Progressing   Problem: Nutrition: Goal: Adequate nutrition will be maintained Outcome: Progressing   Problem: Coping: Goal: Level of anxiety will decrease Outcome: Progressing   Problem: Elimination: Goal: Will not experience complications related to bowel motility Outcome: Progressing   Problem: Pain Managment: Goal: General experience of comfort will improve and/or be controlled Outcome: Progressing   Problem: Safety: Goal: Ability to remain free from injury will improve Outcome: Progressing

## 2023-12-25 NOTE — Progress Notes (Signed)
 Progress Note   Patient: [name] FMW:[id number] DOB: 1975-12-13 DOA: 12/23/2023     1 DOS: the patient was seen and examined on 12/25/2023   Brief hospital course: [name] is a 48 y.o. female with medical history significant for ESRD on dialysis, diabetes mellitus, DVT, hypertension, seizure, SLE, moderate intellectual disabilities presented to the emergency department for evaluation of low hemoglobin.  Patient had recent EGD which showed gastritis.  GI, nephrology consulted, she is being admitted for further management and evaluation of acute on chronic anemia, possible GI bleed.   Assessment and Plan: Acute on chronic anemia Possible GI bleed/acute blood loss anemia- Presented with hemoglobin of 5.3, baseline around 8.5 S/p 2 units of blood, Hb today 10. Close monitoring of H&H, transfuse for hemoglobin less than 7. Continue to hold Plavix , no heparin  for DVT.  Continue IV PPI. Recent EGD showed gastritis no active bleeding. Colonoscopy today showed- Multiple AVMs, 1 actively bleeding. Treated all with coagulation. Lesion in sigmoid colon oozing blood, clip placed with hemostasis. Per GI started on soft diet, wait 2 more days before restarting Plavix .    ESRD on hemodialysis: Nephrology on board for hemodialysis needs. Correction of K with HD.   Hypokalemia: Improved with oral repletion. Further correction with dialysis.   Hypophosphatemia: Phos improved with K-Phos dose.   Hypertension: Not on hypertensives Continue midodrine  home dose.   Seizure disorder- Continue Dilantin  therapy.   COVID-positive 12/07/2023- No symptoms of COVID. No need for isolation as it is more than 10 days.      Out of bed to chair. Incentive spirometry. Nursing supportive care. Fall, aspiration precautions. Diet:  Diet Orders (From admission, onward)     Start     Ordered   12/25/23 1520  Diet renal with fluid restriction Fluid restriction: 1200 mL Fluid; Room  service appropriate? Yes; Fluid consistency: Thin  Diet effective now       Question Answer Comment  Fluid restriction: 1200 mL Fluid   Room service appropriate? Yes   Fluid consistency: Thin      12/25/23 1520           DVT prophylaxis: SCDs Start: 12/23/23 1157  Level of care: Telemetry   Code Status: Full Code  Subjective: Patient is seen and examined today morning. She is lying comfortably. Denies any complaints. No active bleeding. Awaiting colonoscopy.  Physical Exam: Vitals:   12/25/23 1550 12/25/23 1636 12/25/23 1655 12/25/23 1700  BP: (!) 146/77 (!) 140/76 (!) 143/81 (!) 141/74  Pulse: 78 72 73 74  Resp: 20 10 10 13   Temp: (!) 97.4 F (36.3 C) (!) 97.5 F (36.4 C)    TempSrc: Oral Oral    SpO2: 100% 99% 96% 98%  Weight:  41.8 kg    Height:        General - Middle aged [place] American female, no apparent distress HEENT - PERRLA, EOMI, atraumatic head, non tender sinuses. Lung - Clear, basal rales, no rhonchi, wheezes. Heart - S1, S2 heard, no murmurs, rubs, trace pedal edema. Abdomen - Soft, non tender, bowel sounds good Neuro - Alert, awake and oriented, non focal exam. Skin - Warm and dry.  Data Reviewed:      Latest Ref Rng & Units 12/25/2023   11:55 AM 12/25/2023    8:47 AM 12/24/2023    5:32 AM  CBC  WBC 4.0 - 10.5 K/uL 8.7   9.8   Hemoglobin 12.0 - 15.0 g/dL 9.4  9.6  89.9   Hematocrit  36.0 - 46.0 % 28.2  28.9  30.2   Platelets 150 - 400 K/uL 241   303       Latest Ref Rng & Units 12/25/2023    8:47 AM 12/24/2023    5:32 AM 12/23/2023   10:46 AM  BMP  Glucose 70 - 99 mg/dL 80  77  84   BUN 6 - 20 mg/dL 24  19  10    Creatinine 0.44 - 1.00 mg/dL 4.19  5.86  7.37   Sodium 135 - 145 mmol/L 139  140  141   Potassium 3.5 - 5.1 mmol/L 4.1  4.4  3.2   Chloride 98 - 111 mmol/L 99  101  99   CO2 22 - 32 mmol/L 26  29  31    Calcium 8.9 - 10.3 mg/dL 9.4  9.6  8.7    No results found.   Family Communication: Discussed with patient, understand  and agree. All questions answered.  Disposition: Status is: Observation The patient will require care spanning > 2 midnights and should be moved to inpatient because: active bleeding lesions on colonoscopy, monitor Hb.  Planned Discharge Destination: Home     Time spent: 43 minutes  Author: Concepcion Riser, MD 12/25/2023 5:16 PM Secure chat 7am to 7pm For on call review [email or web address].

## 2023-12-25 NOTE — Progress Notes (Signed)
 Admit: 12/23/2023 LOS: 1  67F ESRD MWF [facility] using thigh AVG with recurrent severe anemia  Subjective:  No overnight events, patient without complaints this morning, For dialysis today For colonoscopy today Hemoglobin stable overnight, 9.6 this morning  10/09 0701 - 10/10 0700 In: 1104.5 [P.O.:360; I.V.:744.5] Out: -   Filed Weights   12/23/23 1019 12/23/23 1619  Weight: 64.9 kg 53 kg    Scheduled Meds:  ALPRAZolam   0.25 mg Oral BID   calcium carbonate  1,000 mg Oral BID BM   midodrine   10 mg Oral BID   mirtazapine   7.5 mg Oral QHS   multivitamin with minerals  1 tablet Oral Daily   pantoprazole  (PROTONIX ) IV  40 mg Intravenous Q24H   phenytoin   100 mg Oral BID   sertraline   200 mg Oral Daily   simvastatin   40 mg Oral QHS   Continuous Infusions:  sodium chloride  20 mL/hr at 12/25/23 0532   PRN Meds:.acetaminophen  **OR** acetaminophen , albuterol , guaiFENesin , HYDROmorphone  (DILAUDID ) injection, ipratropium, ondansetron  **OR** ondansetron  (ZOFRAN ) IV  Current Labs: reviewed  Hemoglobin reviewed, stable 9.6  Physical Exam:  Blood pressure (!) 140/79, pulse 82, temperature 97.6 F (36.4 C), resp. rate 16, height 5' 2 (1.575 m), weight 53 kg, last menstrual period 10/28/2023, SpO2 99%. NAD Left thigh AVG with bruit and thrill No significant edema Regular, normal S1 and S2 Clear bilaterally  A Acute anemia with concern for GI bleed; baseline anemia of CKD for colonoscopy, GI following.  Transfused 2 units at presentation with appropriate increase in hemoglobin.  Holding heparin  with dialysis.  Outpatient ESA dosing not available at this time ESRD on HD MWF at [facility] using thigh AVG: On schedule, next treatment today Hypertension/volume: Normotensive, not overtly hypervolemic, uses midodrine  with HD.  Trend with dialysis today CKD-BMD: Calcium and phosphorus at target at admission.  Outpatient management.  No active issues Hypokalemia: Resolved.   Supplemented 10/8.  P HD today, orders written, goal UF 1 to 2 L; no heparin . Next HD 10/13 if remains inpatient Medication Issues; Preferred narcotic agents for pain control are hydromorphone , fentanyl , and methadone. Morphine  should not be used.  Baclofen should be avoided Avoid oral sodium phosphate  and magnesium citrate based laxatives / bowel preps   Will not physically see the patient over the weekend.  Please reach out for any questions or concerns to the nephrologist on-call.   Bernardino Gasman MD 12/25/2023, 10:04 AM  Recent Labs  Lab 12/23/23 1046 12/23/23 1047 12/24/23 0532  NA 141  --  140  K 3.2*  --  4.4  CL 99  --  101  CO2 31  --  29  GLUCOSE 84  --  77  BUN 10  --  19  CREATININE 2.62*  --  4.13*  CALCIUM 8.7*  --  9.6  PHOS  --  1.7* 3.6   Recent Labs  Lab 12/23/23 1046 12/23/23 2101 12/24/23 0532 12/25/23 0847  WBC 6.9  --  9.8  --   NEUTROABS 4.4  --   --   --   HGB 5.3* 9.4* 10.0* 9.6*  HCT 16.7* 27.6* 30.2* 28.9*  MCV 85.6  --  83.7  --   PLT 305  --  303  --

## 2023-12-25 NOTE — Transfer of Care (Signed)
 Immediate Anesthesia Transfer of Care Note  Patient: [name]  Procedure(s) Performed: COLONOSCOPY  Patient Location: PACU  Anesthesia Type:General  Level of Consciousness: drowsy  Airway & Oxygen Therapy: Patient Spontanous Breathing  Post-op Assessment: Report given to RN and Post -op Vital signs reviewed and stable  Post vital signs: Reviewed and stable  Last Vitals:  Vitals Value Taken Time  BP 130/69   Temp    Pulse 81   Resp 21   SpO2 100%     Last Pain:  Vitals:   12/25/23 1341  TempSrc: Oral  PainSc: 0-No pain         Complications: No notable events documented.

## 2023-12-25 NOTE — Interval H&P Note (Signed)
 History and Physical Interval Note:  12/25/2023 2:05 PM  [name]  has presented today for surgery, with the diagnosis of acute on chronic anemia, hemoccult positive stool.  The various methods of treatment have been discussed with the patient and family. After consideration of risks, benefits and other options for treatment, the patient has consented to  Procedure(s): COLONOSCOPY (N/A) as a surgical intervention.  The patient's history has been reviewed, patient examined, no change in status, stable for surgery.  I have reviewed the patient's chart and labs.  Questions were answered to the patient's satisfaction.     Carlin MARLA Hasty

## 2023-12-26 ENCOUNTER — Telehealth: Payer: Self-pay | Admitting: Internal Medicine

## 2023-12-26 DIAGNOSIS — D649 Anemia, unspecified: Secondary | ICD-10-CM | POA: Diagnosis not present

## 2023-12-26 DIAGNOSIS — K5521 Angiodysplasia of colon with hemorrhage: Secondary | ICD-10-CM

## 2023-12-26 DIAGNOSIS — N186 End stage renal disease: Secondary | ICD-10-CM | POA: Diagnosis not present

## 2023-12-26 DIAGNOSIS — F418 Other specified anxiety disorders: Secondary | ICD-10-CM | POA: Diagnosis not present

## 2023-12-26 LAB — CBC
HCT: 29.5 % — ABNORMAL LOW (ref 36.0–46.0)
Hemoglobin: 9.8 g/dL — ABNORMAL LOW (ref 12.0–15.0)
MCH: 28.2 pg (ref 26.0–34.0)
MCHC: 33.2 g/dL (ref 30.0–36.0)
MCV: 85 fL (ref 80.0–100.0)
Platelets: 238 K/uL (ref 150–400)
RBC: 3.47 MIL/uL — ABNORMAL LOW (ref 3.87–5.11)
RDW: 19.9 % — ABNORMAL HIGH (ref 11.5–15.5)
WBC: 7.5 K/uL (ref 4.0–10.5)
nRBC: 0 % (ref 0.0–0.2)

## 2023-12-26 LAB — BASIC METABOLIC PANEL WITH GFR
Anion gap: 9 (ref 5–15)
BUN: 13 mg/dL (ref 6–20)
CO2: 30 mmol/L (ref 22–32)
Calcium: 8.9 mg/dL (ref 8.9–10.3)
Chloride: 98 mmol/L (ref 98–111)
Creatinine, Ser: 3.99 mg/dL — ABNORMAL HIGH (ref 0.44–1.00)
GFR, Estimated: 13 mL/min — ABNORMAL LOW (ref >60)
Glucose, Bld: 85 mg/dL (ref 70–99)
Potassium: 3.5 mmol/L (ref 3.5–5.1)
Sodium: 137 mmol/L (ref 135–145)

## 2023-12-26 MED ORDER — CLOPIDOGREL BISULFATE 75 MG PO TABS: 75.0000 mg | ORAL_TABLET | Freq: Every day | ORAL | Status: DC

## 2023-12-26 NOTE — TOC Transition Note (Signed)
 Transition of Care [facility]) - Discharge Note   Patient Details  Name: [name] MRN: [id number] Date of Birth: 19-Dec-1975  [facility] CM/SW Contact:  Sharlyne Stabs, RN Phone Number: 12/26/2023, 12:50 PM   Clinical Narrative:   Patient is medically ready to discharge back to group home.  DC summary faxed to Legal Guardian at [phone] as requested. Faxed FL2 and DC summary to [facility] at [phone]. CM called Randie, she states they do not have transportation on weekend.  Discussed with RN, patient is ambulatory, CM scheduled [facility] for transportation. Team updated.     Final next level of care: Group Home Barriers to Discharge: Barriers Resolved   Patient Goals and CMS Choice Patient states their goals for this hospitalization and ongoing recovery are:: return back to group home CMS Medicare.gov Compare Post Acute Care list provided to:: Legal Guardian Choice offered to / list presented to : [facility] POA / Guardian     Discharge Placement               Patient to be transferred to facility by: [facility] Name of family member notified: Randie Patient and family notified of of transfer: 12/26/23  Discharge Plan and Services Additional resources added to the After Visit Summary for   In-house Referral: Clinical Social Work Discharge Planning Services: NA Post Acute Care Choice: Durable Medical Equipment              Social Drivers of Health (SDOH) Interventions SDOH Screenings   Food Insecurity: No Food Insecurity (12/23/2023)  Housing: Low Risk  (12/23/2023)  Transportation Needs: No Transportation Needs (12/23/2023)  Utilities: Not At Risk (12/23/2023)  Alcohol  Screen: Low Risk  (07/01/2022)  Depression (PHQ2-9): Low Risk  (07/01/2022)  Financial Resource Strain: Low Risk  (07/01/2022)  Physical Activity: Inactive (07/01/2022)  Social Connections: Moderately Isolated (12/23/2023)  Stress: No Stress Concern Present  (07/01/2022)  Tobacco Use: High Risk (12/25/2023)     Readmission Risk Interventions    12/24/2023   12:18 PM 12/10/2023    1:07 PM  Readmission Risk Prevention Plan  Transportation Screening Complete Complete  PCP or Specialist Appt within 5-7 Days  Complete  Home Care Screening Complete Complete  Medication Review (RN CM) Complete Complete

## 2023-12-26 NOTE — Anesthesia Postprocedure Evaluation (Signed)
 Anesthesia Post Note  Patient: Marice Guidone  Procedure(s) Performed: COLONOSCOPY  Patient location during evaluation: PACU Anesthesia Type: General Level of consciousness: awake and alert Pain management: pain level controlled Vital Signs Assessment: post-procedure vital signs reviewed and stable Respiratory status: spontaneous breathing, nonlabored ventilation, respiratory function stable and patient connected to nasal cannula oxygen Cardiovascular status: blood pressure returned to baseline and stable Postop Assessment: no apparent nausea or vomiting Anesthetic complications: no   No notable events documented.   Last Vitals:  Vitals:   12/25/23 2059 12/26/23 0325  BP: 121/70 129/69  Pulse: 78 83  Resp: 12 14  Temp: 36.7 C 36.7 C  SpO2: 94% 100%    Last Pain:  Vitals:   12/26/23 0325  TempSrc: Oral  PainSc:                  Andrea Limes

## 2023-12-26 NOTE — Discharge Summary (Addendum)
 Physician Discharge Summary   Patient: [name] MRN: [id number] DOB: 1975-06-24  Admit date:     12/23/2023  Discharge date: 12/26/23  Discharge Physician: Concepcion Riser   PCP: Wilmon Penton, NP   Recommendations at discharge:    PCP follow up in 1 week. Restart Plavix  after 2 days.  Follow up with GI clinic as scheduled.  Discharge Diagnoses: Principal Problem:   Acute on chronic anemia Active Problems:   AVM (arteriovenous malformation) of colon with hemorrhage   ESRD on dialysis [facility])   Depression with anxiety   MENTAL RETARDATION, MODERATE   Essential (primary) hypertension   Systemic lupus erythematosus, organ or system involvement unspecified (HCC)   Hypokalemia   Hypophosphatemia   Chronic kidney disease  Resolved Problems:   * No resolved hospital problems. [facility] Course: [name] is a 48 y.o. female with medical history significant for ESRD on dialysis, diabetes mellitus, DVT, hypertension, seizure, SLE, moderate intellectual disabilities presented to the emergency department for evaluation of low hemoglobin.  Her hemoglobin upon presentation 5.3, patient had recent EGD which showed gastritis.  GI, nephrology consulted, she is admitted for further management and evaluation of acute on chronic anemia, possible GI bleed.   Patient got 2 units of blood transfusion, had Colonoscopy which showed multiple AVMs, bleeding ones coagulated. Lesion in sigmoid colon oozing blood, clip placed with hemostasis.  GI advised to restart Plavix  in 2 days.  Her hemoglobin remained stable, able to tolerate diet and is hemodynamically stable to be discharged home.   Assessment and Plan: Acute on chronic anemia Possible GI bleed/acute blood loss anemia- Presented with hemoglobin of 5.3, baseline around 8.5 S/p 2 units of blood, Hb stable above 9. Patient's hemoglobin closely monitored, Plavix  held, continued on IV PPI. Recent EGD showed gastritis no  active bleeding. Colonoscopy today showed- Multiple AVMs, 1 actively bleeding. Treated all with coagulation. Lesion in sigmoid colon oozing blood, clip placed with hemostasis. Per GI started on soft diet, advised to wait 2 more days before restarting Plavix .  I advised her to follow-up with PCP, GI upon discharge as instructed.  She understands and agrees with the discharge plan.   ESRD on hemodialysis: Nephrology on board for hemodialysis needs.   Hypokalemia: Improved with oral repletion. Further correction with dialysis.   Hypophosphatemia: Phos improved with K-Phos dose.   Hypertension: Not on hypertensives Continue midodrine  home dose.   Seizure disorder- Continue Dilantin  therapy.   COVID-positive 12/07/2023- No symptoms of COVID. No need for isolation as it is more than 10 days.      Consultants: GI, nephrology Procedures performed: Colonoscopy Disposition: Assisted living Diet recommendation:  Discharge Diet Orders (From admission, onward)     Start     Ordered   12/26/23 0000  Diet - low sodium heart healthy        12/26/23 1050   12/26/23 0000  Diet renal with fluid restriction        12/26/23 1050           Renal diet DISCHARGE MEDICATION: Allergies as of 12/26/2023       Reactions   Dust Mite Extract Other (See Comments)   sneezing        Medication List     TAKE these medications    albuterol  (2.5 MG/3ML) 0.083% nebulizer solution Commonly known as: PROVENTIL  Take 2.5 mg by nebulization 4 (four) times daily as needed for wheezing or shortness of breath.   ALPRAZolam  0.25 MG tablet Commonly known  as: XANAX  Take 0.25 mg by mouth 2 (two) times daily.   ammonium lactate  12 % lotion Commonly known as: Amlactin Daily Apply 1 Application topically as needed for dry skin.   aspirin EC 81 MG tablet Take 81 mg by mouth daily. Swallow whole.   chlorhexidine  0.12 % solution Commonly known as: PERIDEX  Use as directed 15 mLs in the mouth or  throat 2 (two) times daily. After regular toothbrushing, rinse mouth   clopidogrel  75 MG tablet Commonly known as: PLAVIX  Take 1 tablet (75 mg total) by mouth daily. Start taking on: December 28, 2023 What changed: These instructions start on December 28, 2023. If you are unsure what to do until then, ask your doctor or other care provider.   cycloSPORINE 0.05 % ophthalmic emulsion Commonly known as: RESTASIS Place 1 drop into both eyes 2 (two) times daily.   docusate sodium  100 MG capsule Commonly known as: COLACE Take 100 mg by mouth daily.   epoetin  alfa 4000 UNIT/ML injection Commonly known as: EPOGEN  Inject 4,000 Units into the vein See admin instructions. Mon, Wed, and Friday at dialysis   guaiFENesin  100 MG/5ML liquid Commonly known as: ROBITUSSIN Take 200 mg by mouth every 6 (six) hours as needed for cough.   hydroxychloroquine  200 MG tablet Commonly known as: PLAQUENIL  Take 200 mg by mouth daily.   ipratropium 0.02 % nebulizer solution Commonly known as: ATROVENT  Take 0.5 mg by nebulization 4 (four) times daily as needed for wheezing or shortness of breath (For Breathing).   lidocaine -prilocaine  cream Commonly known as: EMLA  Apply 1 application topically as needed (port access).   loratadine 10 MG tablet Commonly known as: CLARITIN Take 10 mg by mouth daily as needed for allergies.   midodrine  10 MG tablet Commonly known as: PROAMATINE  Take 10 mg by mouth 2 (two) times daily.   mirtazapine  15 MG tablet Commonly known as: REMERON  Take 7.5 mg by mouth at bedtime.   multivitamin Tabs tablet Take 1 tablet by mouth daily.   pantoprazole  40 MG tablet Commonly known as: PROTONIX  Take 40 mg by mouth daily.   phenytoin  100 MG ER capsule Commonly known as: DILANTIN  Take 100 mg by mouth 2 (two) times daily.   sertraline  100 MG tablet Commonly known as: ZOLOFT  Take 200 mg by mouth daily.   simvastatin  40 MG tablet Commonly known as: ZOCOR  Take 40 mg by  mouth at bedtime.   TUMS PO Take 1,000 mg by mouth 2 (two) times daily between meals.        Follow-up Information     Gammon, Chrystal, NP. Schedule an appointment as soon as possible for a visit in 1 week(s).   Specialty: Nurse Practitioner Contact information: [address] [place] [address] [phone]                Discharge Exam: Fredricka Weights   12/23/23 1619 12/25/23 1636 12/25/23 2030  Weight: 53 kg 41.8 kg 39.8 kg      12/26/2023    3:25 AM 12/25/2023    8:59 PM 12/25/2023    8:30 PM  Vitals with BMI  Weight   87 lbs 12 oz  BMI   16.04  Systolic 129 121 866  Diastolic 69 70 68  Pulse 83 78 73    General - Middle aged [place] American female, no apparent distress HEENT - PERRLA, EOMI, atraumatic head, non tender sinuses. Lung - Clear, basal rales, no rhonchi, wheezes. Heart - S1, S2 heard, no murmurs, rubs,  trace pedal edema. Abdomen - Soft, non tender, bowel sounds good Neuro - Alert, awake and oriented, non focal exam. Skin - Warm and dry.  Condition at discharge: stable  The results of significant diagnostics from this hospitalization (including imaging, microbiology, ancillary and laboratory) are listed below for reference.   Imaging Studies: DG Chest Port 1 View Result Date: 12/23/2023 EXAM: 1 VIEW(S) XRAY OF THE CHEST 12/23/2023 10:40:00 AM COMPARISON: 12/10/2023 CLINICAL HISTORY: weakness. Pt BIB [facility] from dialysis for c/o low hemoglobin; dialysis states pt's hemoglobin is 5.9; pt was seen here last week for same complaint and received blood; pt is unsure how many; ; Pt denies any sob or pain. Hx of DVT, hypertension. Current everyday smoker FINDINGS: LUNGS AND PLEURA: Interval increase in right pleural effusion which is now moderate in volume compared to small. No focal pulmonary opacity. No pulmonary edema. No pneumothorax. HEART AND MEDIASTINUM: Vascular stents noted. No acute abnormality of the cardiac and mediastinal silhouettes.  BONES AND SOFT TISSUES: No acute osseous abnormality. IMPRESSION: 1. Interval increase in right pleural effusion, now moderate in volume compared to small. 2. No focal pulmonary opacity, pulmonary edema, or pneumothorax. Electronically signed by: Norleen Boxer MD 12/23/2023 11:12 AM EDT RP Workstation: [id number]   DG CHEST PORT 1 VIEW Result Date: 12/10/2023 EXAM: 1 VIEW(S) XRAY OF THE CHEST 12/10/2023 04:30:00 AM COMPARISON: 12/08/2023 CLINICAL HISTORY: COVID-19 virus infection. 3rd SHIFT MORNING ROUTINE follow-up IMAGE timed for 0600 FOR: COVID-19 virus infection. FINDINGS: LINES, TUBES AND DEVICES: There is a stent identified within the left innominate vein. LUNGS AND PLEURA: Persistent diffuse interstitial opacities along with scattered patchy airspace opacities in the right upper and right lower lung. Unchanged appearance of small to moderate right pleural effusion. No pulmonary edema. No pneumothorax. HEART AND MEDIASTINUM: No acute abnormality of the cardiac and mediastinal silhouettes. BONES AND SOFT TISSUES: Bilateral avascular necrosis identified within the humeral heads. IMPRESSION: 1. Persistent diffuse interstitial opacities which may be seen with pulmonary edema and/or atypical viral infection. 2. Patchy airspace opacities in the right upper and right lower lobes unchanged from prior. 3. Unchanged small to moderate right pleural effusion. Electronically signed by: Waddell Calk MD 12/10/2023 06:07 AM EDT RP Workstation: [id number]   US  Abdomen Limited RUQ (LIVER/GB) Result Date: 12/09/2023 CLINICAL DATA:  Elevated LFTs EXAM: ULTRASOUND ABDOMEN LIMITED RIGHT UPPER QUADRANT COMPARISON:  None Available. FINDINGS: Gallbladder: No gallstones or wall thickening visualized. No sonographic Murphy sign noted by sonographer. Possible small gallbladder polyp measuring up to 0.5 cm versus nonshadowing gallstone along the dependent portion of the gallbladder. Gallbladder is relatively decompressed. Common  bile duct: Diameter: 2.0 mm. Liver: No focal lesion identified. Within normal limits in parenchymal echogenicity. Portal vein is patent on color Doppler imaging with normal direction of blood flow towards the liver. Other: None. IMPRESSION: Tiny 0.5 cm gallbladder polyp versus nonshadowing gallstone. No follow-up is indicated if patient has no prior history of malignancy. Electronically Signed   By: Michaeline Blanch M.D.   On: 12/09/2023 15:07   DG CHEST PORT 1 VIEW Result Date: 12/08/2023 CLINICAL DATA:  COVID-19 infection fall EXAM: PORTABLE CHEST 1 VIEW COMPARISON:  January 23, 2022 FINDINGS: Patchy opacity at the right lung base with small to moderate right pleural effusion. Left mid lung and lower lung patchy opacities as well as right midlung opacity. No pneumothorax. Unchanged cardiomediastinal silhouette. Vascular calcification. Left upper extremity vascular stent. IMPRESSION: Consolidation at the right lung base and additional multifocal opacities bilaterally likely representing multifocal infection. Small to  moderate right pleural effusion. Electronically Signed   By: Michaeline Blanch M.D.   On: 12/08/2023 13:49   MM 3D SCREENING MAMMOGRAM BILATERAL BREAST Result Date: 12/07/2023 CLINICAL DATA:  Screening. EXAM: DIGITAL SCREENING BILATERAL MAMMOGRAM WITH TOMOSYNTHESIS AND CAD TECHNIQUE: Bilateral screening digital craniocaudal and mediolateral oblique mammograms were obtained. Bilateral screening digital breast tomosynthesis was performed. The images were evaluated with computer-aided detection. COMPARISON:  Previous exam(s). ACR Breast Density Category d: The breasts are extremely dense, which lowers the sensitivity of mammography. FINDINGS: There are no findings suspicious for malignancy. Limited visualization of the left axilla due to difficulty with patient positioning. IMPRESSION: No mammographic evidence of malignancy. A result letter of this screening mammogram will be mailed directly to the patient.  RECOMMENDATION: Screening mammogram in one year. (Code:SM-B-01Y) BI-RADS CATEGORY  1: Negative. Electronically Signed   By: Inocente Ast M.D.   On: 12/07/2023 12:03    Microbiology: Results for orders placed or performed during the hospital encounter of 12/23/23  MRSA Next Gen by PCR, Nasal     Status: None   Collection Time: 12/24/23  5:28 AM   Specimen: Nasal Mucosa; Nasal Swab  Result Value Ref Range Status   MRSA by PCR Next Gen NOT DETECTED NOT DETECTED Final    Comment: (NOTE) The GeneXpert MRSA Assay (FDA approved for NASAL specimens only), is one component of a comprehensive MRSA colonization surveillance program. It is not intended to diagnose MRSA infection nor to guide or monitor treatment for MRSA infections. Test performance is not FDA approved in patients less than 89 years old. Performed at [facility], [address], [place], [place] [address]     Labs: CBC: Recent Labs  Lab 12/23/23 1046 12/23/23 2101 12/24/23 0532 12/25/23 0847 12/25/23 1155 12/26/23 0349  WBC 6.9  --  9.8  --  8.7 7.5  NEUTROABS 4.4  --   --   --   --   --   HGB 5.3* 9.4* 10.0* 9.6* 9.4* 9.8*  HCT 16.7* 27.6* 30.2* 28.9* 28.2* 29.5*  MCV 85.6  --  83.7  --  84.9 85.0  PLT 305  --  303  --  241 238   Basic Metabolic Panel: Recent Labs  Lab 12/23/23 1046 12/23/23 1047 12/24/23 0532 12/25/23 0847 12/26/23 0349  NA 141  --  140 139 137  K 3.2*  --  4.4 4.1 3.5  CL 99  --  101 99 98  CO2 31  --  29 26 30   GLUCOSE 84  --  77 80 85  BUN 10  --  19 24* 13  CREATININE 2.62*  --  4.13* 5.80* 3.99*  CALCIUM 8.7*  --  9.6 9.4 8.9  MG  --  1.8  --   --   --   PHOS  --  1.7* 3.6 3.9  --    Liver Function Tests: Recent Labs  Lab 12/23/23 1046 12/25/23 0847  AST 35  --   ALT 25  --   ALKPHOS 67  --   BILITOT <0.2  --   PROT 6.2*  --   ALBUMIN  3.9 3.6   CBG: No results for input(s): GLUCAP in the last 168 hours.  Discharge time spent: 36  minutes.  Signed: Concepcion Riser, MD [facility] 12/26/2023

## 2023-12-26 NOTE — Telephone Encounter (Signed)
 Please arrange hospital follow-up visit for this patient with [facility] or Dr. Eartha.  Thank you

## 2023-12-26 NOTE — NC FL2 (Signed)
 [place]  MEDICAID FL2 LEVEL OF CARE FORM     IDENTIFICATION  Patient Name: [name] Birthdate: 06/03/1975 Sex: female Admission Date (Current Location): 12/23/2023  [place] and [place] Number:  Raynaldo [id number] R Facility and Address:  [facility],  [address] [place] [address]      Provider Number: [id number]  Attending Physician Name and Address:  Darci Pore, MD  Relative Name and Phone Number:  Dewayne Clarity (Legal Guardian) [phone]    Current Level of Care: Hospital Recommended Level of Care: [facility], Other (Comment) (Group Home) Prior Approval Number:    Date Approved/Denied:   PASRR Number:    Discharge Plan: Other (Comment) (Group Home)    Current Diagnoses: Patient Active Problem List   Diagnosis Date Noted   AVM (arteriovenous malformation) of colon with hemorrhage 12/26/2023   Chronic kidney disease 12/24/2023   Acute on chronic anemia 12/23/2023   Hypokalemia 12/23/2023   Hypophosphatemia 12/23/2023   Acute GI bleeding 12/08/2023   Upper GI bleed 12/07/2023   Acute anemia 12/07/2023   Encounter for screening fecal occult blood testing 07/01/2022   Encounter for gynecological examination with Papanicolaou smear of cervix 07/01/2022   Hemorrhage from arteriovenous dialysis graft 02/08/2022   Chronic hypotension 02/08/2022   Diabetes (HCC) 11/02/2018   Lipoma of extremity    Hyperkalemia 10/23/2016   Complication of vascular access for dialysis 04/01/2016   Seizure disorder (HCC) 10/01/2015   Generalized muscle weakness 10/01/2015   Vaginal discharge 11/08/2013   Vaginal odor 11/08/2013   BV (bacterial vaginosis) 11/08/2013   ESRD on dialysis (HCC) 12/27/2010   Anemia in chronic kidney disease 12/27/2010   Major depressive disorder with single episode 12/27/2010   Secondary hyperparathyroidism of renal origin 12/27/2010   Systemic lupus erythematosus, organ or system involvement  unspecified (HCC) 12/27/2010   Type 2 diabetes mellitus without complication 12/27/2010   SWELLING, NECK 04/02/2007   Asthma 03/15/2007   FOOT PAIN, RIGHT 03/15/2007   Cough 03/15/2007   MENTAL RETARDATION, MODERATE 01/19/2007   STATUS, MENTAL, ALTERED 07/16/2006   ANOREXIA 05/26/2006   Disease of pericardium 05/15/2006   Tachycardia 05/15/2006   Lupus 04/01/2006   ANEMIA-NOS 03/31/2006   LEUKOCYTOSIS 03/31/2006   Depression with anxiety 03/31/2006   Migraine headache 03/31/2006   Hereditary and idiopathic peripheral neuropathy 03/31/2006   Essential (primary) hypertension 03/31/2006   GERD 03/31/2006   Osteoarthritis 03/31/2006   Other specified abnormal findings of blood chemistry 03/31/2006    Orientation RESPIRATION BLADDER Height & Weight     Time, Self, Situation, Place  Normal Continent Weight: 39.8 kg Height:  5' 2 (157.5 cm)  BEHAVIORAL SYMPTOMS/MOOD NEUROLOGICAL BOWEL NUTRITION STATUS      Continent Diet (See DC summary)  AMBULATORY STATUS COMMUNICATION OF NEEDS Skin   Supervision Verbally Normal                       Personal Care Assistance Level of Assistance  Bathing, Feeding, Dressing Bathing Assistance: Independent Feeding assistance: Independent Dressing Assistance: Independent     Functional Limitations Info  Sight, Hearing, Speech Sight Info: Impaired Hearing Info: Adequate Speech Info: Adequate    SPECIAL CARE FACTORS FREQUENCY                       Contractures Contractures Info: Not present    Additional Factors Info  Code Status, Allergies, Psychotropic Code Status Info: FULL Allergies Info: Dust Mite Extract Psychotropic Info: Zoloft   Current Medications (12/26/2023):  This is the current hospital active medication list Current Facility-Administered Medications  Medication Dose Route Frequency Provider Last Rate Last Admin   acetaminophen  (TYLENOL ) tablet 650 mg  650 mg Oral Q6H PRN Darci Pore, MD        Or   acetaminophen  (TYLENOL ) suppository 650 mg  650 mg Rectal Q6H PRN Darci Pore, MD       albuterol  (PROVENTIL ) (2.5 MG/3ML) 0.083% nebulizer solution 2.5 mg  2.5 mg Nebulization Q6H PRN Darci Pore, MD       ALPRAZolam  (XANAX ) tablet 0.25 mg  0.25 mg Oral BID Sreeram, Narendranath, MD   0.25 mg at 12/26/23 1116   calcium carbonate (TUMS - dosed in mg elemental calcium) chewable tablet 400 mg of elemental calcium  1,000 mg Oral BID BM Sreeram, Narendranath, MD   400 mg of elemental calcium at 12/26/23 1115   guaiFENesin  (ROBITUSSIN) 100 MG/5ML liquid 200 mg  200 mg Oral Q6H PRN Darci Pore, MD       HYDROmorphone  (DILAUDID ) injection 0.5 mg  0.5 mg Intravenous Q4H PRN Darci Pore, MD       ipratropium (ATROVENT ) nebulizer solution 0.5 mg  0.5 mg Nebulization QID PRN Darci Pore, MD       midodrine  (PROAMATINE ) tablet 10 mg  10 mg Oral BID Sreeram, Narendranath, MD   10 mg at 12/26/23 1115   mirtazapine  (REMERON ) tablet 7.5 mg  7.5 mg Oral QHS Sreeram, Narendranath, MD   7.5 mg at 12/25/23 2229   multivitamin with minerals tablet 1 tablet  1 tablet Oral Daily Sreeram, Narendranath, MD   1 tablet at 12/26/23 1115   ondansetron  (ZOFRAN ) tablet 4 mg  4 mg Oral Q6H PRN Darci Pore, MD       Or   ondansetron  (ZOFRAN ) injection 4 mg  4 mg Intravenous Q6H PRN Darci Pore, MD       pantoprazole  (PROTONIX ) injection 40 mg  40 mg Intravenous Q24H Sreeram, Narendranath, MD   40 mg at 12/26/23 1140   phenytoin  (DILANTIN ) ER capsule 100 mg  100 mg Oral BID Sreeram, Narendranath, MD   100 mg at 12/26/23 1116   sertraline  (ZOLOFT ) tablet 200 mg  200 mg Oral Daily Sreeram, Narendranath, MD   200 mg at 12/26/23 1115   simvastatin  (ZOCOR ) tablet 40 mg  40 mg Oral QHS Sreeram, Narendranath, MD   40 mg at 12/25/23 2229     Discharge Medications: Allergies as of 12/26/2023       Reactions   Dust Mite Extract Other (See Comments)    sneezing        Medication List     TAKE these medications    albuterol  (2.5 MG/3ML) 0.083% nebulizer solution Commonly known as: PROVENTIL  Take 2.5 mg by nebulization 4 (four) times daily as needed for wheezing or shortness of breath.   ALPRAZolam  0.25 MG tablet Commonly known as: XANAX  Take 0.25 mg by mouth 2 (two) times daily.   ammonium lactate  12 % lotion Commonly known as: Amlactin Daily Apply 1 Application topically as needed for dry skin.   aspirin EC 81 MG tablet Take 81 mg by mouth daily. Swallow whole.   chlorhexidine  0.12 % solution Commonly known as: PERIDEX  Use as directed 15 mLs in the mouth or throat 2 (two) times daily. After regular toothbrushing, rinse mouth   clopidogrel  75 MG tablet Commonly known as: PLAVIX  Take 1 tablet (75 mg total) by mouth daily. Start taking on: December 28, 2023 What changed:  These instructions start on December 28, 2023. If you are unsure what to do until then, ask your doctor or other care provider.   cycloSPORINE 0.05 % ophthalmic emulsion Commonly known as: RESTASIS Place 1 drop into both eyes 2 (two) times daily.   docusate sodium  100 MG capsule Commonly known as: COLACE Take 100 mg by mouth daily.   epoetin  alfa 4000 UNIT/ML injection Commonly known as: EPOGEN  Inject 4,000 Units into the vein See admin instructions. Mon, Wed, and Friday at dialysis   guaiFENesin  100 MG/5ML liquid Commonly known as: ROBITUSSIN Take 200 mg by mouth every 6 (six) hours as needed for cough.   hydroxychloroquine  200 MG tablet Commonly known as: PLAQUENIL  Take 200 mg by mouth daily.   ipratropium 0.02 % nebulizer solution Commonly known as: ATROVENT  Take 0.5 mg by nebulization 4 (four) times daily as needed for wheezing or shortness of breath (For Breathing).   lidocaine -prilocaine  cream Commonly known as: EMLA  Apply 1 application topically as needed (port access).   loratadine 10 MG tablet Commonly known as: CLARITIN Take 10  mg by mouth daily as needed for allergies.   midodrine  10 MG tablet Commonly known as: PROAMATINE  Take 10 mg by mouth 2 (two) times daily.   mirtazapine  15 MG tablet Commonly known as: REMERON  Take 7.5 mg by mouth at bedtime.   multivitamin Tabs tablet Take 1 tablet by mouth daily.   pantoprazole  40 MG tablet Commonly known as: PROTONIX  Take 40 mg by mouth daily.   phenytoin  100 MG ER capsule Commonly known as: DILANTIN  Take 100 mg by mouth 2 (two) times daily.   sertraline  100 MG tablet Commonly known as: ZOLOFT  Take 200 mg by mouth daily.   simvastatin  40 MG tablet Commonly known as: ZOCOR  Take 40 mg by mouth at bedtime.   TUMS PO Take 1,000 mg by mouth 2 (two) times daily between meals.        Relevant Imaging Results:  Relevant Lab Results:   Additional Information SSN: [id number]  Sharlyne Stabs, RN

## 2023-12-28 ENCOUNTER — Encounter (HOSPITAL_COMMUNITY): Payer: Self-pay | Admitting: Internal Medicine

## 2023-12-28 NOTE — Progress Notes (Signed)
 Late note entry, Contacted out-pt HD clinic, [facility] to inform of pt d/c and that pt should have arrived back to clinic today. Faxed d/c summ and last note at this time. No further support needed.   Worley [facility] Nav [phone]

## 2024-01-13 ENCOUNTER — Observation Stay (HOSPITAL_COMMUNITY)
Admission: EM | Admit: 2024-01-13 | Discharge: 2024-01-16 | Disposition: A | Discharge status: Skilled Nursing Facility | Source: Ambulatory Visit | Attending: Gastroenterology | Admitting: Gastroenterology

## 2024-01-13 ENCOUNTER — Encounter (HOSPITAL_COMMUNITY): Payer: Self-pay

## 2024-01-13 ENCOUNTER — Other Ambulatory Visit: Payer: Self-pay

## 2024-01-13 DIAGNOSIS — J45909 Unspecified asthma, uncomplicated: Secondary | ICD-10-CM | POA: Diagnosis not present

## 2024-01-13 DIAGNOSIS — F419 Anxiety disorder, unspecified: Secondary | ICD-10-CM | POA: Insufficient documentation

## 2024-01-13 DIAGNOSIS — K648 Other hemorrhoids: Secondary | ICD-10-CM | POA: Diagnosis not present

## 2024-01-13 DIAGNOSIS — K2970 Gastritis, unspecified, without bleeding: Secondary | ICD-10-CM | POA: Diagnosis not present

## 2024-01-13 DIAGNOSIS — K5521 Angiodysplasia of colon with hemorrhage: Secondary | ICD-10-CM

## 2024-01-13 DIAGNOSIS — K5520 Angiodysplasia of colon without hemorrhage: Secondary | ICD-10-CM

## 2024-01-13 DIAGNOSIS — E1122 Type 2 diabetes mellitus with diabetic chronic kidney disease: Secondary | ICD-10-CM | POA: Insufficient documentation

## 2024-01-13 DIAGNOSIS — Z7982 Long term (current) use of aspirin: Secondary | ICD-10-CM | POA: Diagnosis not present

## 2024-01-13 DIAGNOSIS — F418 Other specified anxiety disorders: Secondary | ICD-10-CM | POA: Diagnosis not present

## 2024-01-13 DIAGNOSIS — I10 Essential (primary) hypertension: Secondary | ICD-10-CM | POA: Diagnosis present

## 2024-01-13 DIAGNOSIS — Z79899 Other long term (current) drug therapy: Secondary | ICD-10-CM | POA: Diagnosis not present

## 2024-01-13 DIAGNOSIS — R195 Other fecal abnormalities: Secondary | ICD-10-CM | POA: Diagnosis not present

## 2024-01-13 DIAGNOSIS — F71 Moderate intellectual disabilities: Secondary | ICD-10-CM | POA: Diagnosis present

## 2024-01-13 DIAGNOSIS — F17210 Nicotine dependence, cigarettes, uncomplicated: Secondary | ICD-10-CM | POA: Insufficient documentation

## 2024-01-13 DIAGNOSIS — F78A1 SYNGAP1-related intellectual disability: Secondary | ICD-10-CM | POA: Diagnosis not present

## 2024-01-13 DIAGNOSIS — G40909 Epilepsy, unspecified, not intractable, without status epilepticus: Secondary | ICD-10-CM

## 2024-01-13 DIAGNOSIS — Z992 Dependence on renal dialysis: Secondary | ICD-10-CM | POA: Diagnosis not present

## 2024-01-13 DIAGNOSIS — F32A Depression, unspecified: Secondary | ICD-10-CM | POA: Insufficient documentation

## 2024-01-13 DIAGNOSIS — K31819 Angiodysplasia of stomach and duodenum without bleeding: Secondary | ICD-10-CM

## 2024-01-13 DIAGNOSIS — N186 End stage renal disease: Secondary | ICD-10-CM

## 2024-01-13 DIAGNOSIS — I120 Hypertensive chronic kidney disease with stage 5 chronic kidney disease or end stage renal disease: Secondary | ICD-10-CM | POA: Insufficient documentation

## 2024-01-13 DIAGNOSIS — D62 Acute posthemorrhagic anemia: Secondary | ICD-10-CM | POA: Diagnosis present

## 2024-01-13 DIAGNOSIS — E119 Type 2 diabetes mellitus without complications: Secondary | ICD-10-CM

## 2024-01-13 DIAGNOSIS — D649 Anemia, unspecified: Secondary | ICD-10-CM | POA: Diagnosis not present

## 2024-01-13 DIAGNOSIS — M329 Systemic lupus erythematosus, unspecified: Secondary | ICD-10-CM | POA: Diagnosis present

## 2024-01-13 LAB — VITAMIN B12: Vitamin B-12: 1207 pg/mL — ABNORMAL HIGH (ref 180–914)

## 2024-01-13 LAB — CBC WITH DIFFERENTIAL/PLATELET
Abs Immature Granulocytes: 0.01 K/uL (ref 0.00–0.07)
Basophils Absolute: 0.1 K/uL (ref 0.0–0.1)
Basophils Relative: 1 %
Eosinophils Absolute: 0.1 K/uL (ref 0.0–0.5)
Eosinophils Relative: 2 %
HCT: 18 % — ABNORMAL LOW (ref 36.0–46.0)
Hemoglobin: 5.9 g/dL — CL (ref 12.0–15.0)
Immature Granulocytes: 0 %
Lymphocytes Relative: 20 %
Lymphs Abs: 1.3 K/uL (ref 0.7–4.0)
MCH: 28.1 pg (ref 26.0–34.0)
MCHC: 32.8 g/dL (ref 30.0–36.0)
MCV: 85.7 fL (ref 80.0–100.0)
Monocytes Absolute: 0.6 K/uL (ref 0.1–1.0)
Monocytes Relative: 8 %
Neutro Abs: 4.7 K/uL (ref 1.7–7.7)
Neutrophils Relative %: 69 %
Platelets: 232 K/uL (ref 150–400)
RBC: 2.1 MIL/uL — ABNORMAL LOW (ref 3.87–5.11)
RDW: 15.9 % — ABNORMAL HIGH (ref 11.5–15.5)
WBC: 6.8 K/uL (ref 4.0–10.5)
nRBC: 0 % (ref 0.0–0.2)

## 2024-01-13 LAB — RETICULOCYTES
Immature Retic Fract: 25.8 % — ABNORMAL HIGH (ref 2.3–15.9)
RBC.: 2.07 MIL/uL — ABNORMAL LOW (ref 3.87–5.11)
Retic Count, Absolute: 64.8 K/uL (ref 19.0–186.0)
Retic Ct Pct: 3.1 % (ref 0.4–3.1)

## 2024-01-13 LAB — COMPREHENSIVE METABOLIC PANEL WITH GFR
ALT: 22 U/L (ref 0–44)
AST: 37 U/L (ref 15–41)
Albumin: 3.9 g/dL (ref 3.5–5.0)
Alkaline Phosphatase: 76 U/L (ref 38–126)
Anion gap: 9 (ref 5–15)
BUN: 10 mg/dL (ref 6–20)
CO2: 32 mmol/L (ref 22–32)
Calcium: 8.5 mg/dL — ABNORMAL LOW (ref 8.9–10.3)
Chloride: 97 mmol/L — ABNORMAL LOW (ref 98–111)
Creatinine, Ser: 2.52 mg/dL — ABNORMAL HIGH (ref 0.44–1.00)
GFR, Estimated: 23 mL/min — ABNORMAL LOW (ref >60)
Glucose, Bld: 77 mg/dL (ref 70–99)
Potassium: 3.3 mmol/L — ABNORMAL LOW (ref 3.5–5.1)
Sodium: 138 mmol/L (ref 135–145)
Total Bilirubin: <0.2 mg/dL (ref 0.0–1.2)
Total Protein: 6.3 g/dL — ABNORMAL LOW (ref 6.5–8.1)

## 2024-01-13 LAB — IRON AND TIBC
Iron: 45 ug/dL (ref 28–170)
Saturation Ratios: 20 % (ref 10.4–31.8)
TIBC: 224 ug/dL — ABNORMAL LOW (ref 250–450)
UIBC: 179 ug/dL

## 2024-01-13 LAB — HEMOGLOBIN AND HEMATOCRIT, BLOOD
HCT: 23.2 % — ABNORMAL LOW (ref 36.0–46.0)
Hemoglobin: 7.8 g/dL — ABNORMAL LOW (ref 12.0–15.0)

## 2024-01-13 LAB — FERRITIN: Ferritin: 549 ng/mL — ABNORMAL HIGH (ref 11–307)

## 2024-01-13 LAB — FOLATE: Folate: >20 ng/mL (ref >5.9)

## 2024-01-13 LAB — PREPARE RBC (CROSSMATCH)

## 2024-01-13 MED ORDER — PANTOPRAZOLE SODIUM 40 MG IV SOLR: 40.0000 mg | Freq: Once | INTRAVENOUS | Status: DC

## 2024-01-13 MED ORDER — PHENYTOIN SODIUM EXTENDED 100 MG PO CAPS
100.0000 mg | ORAL_CAPSULE | Freq: Two times a day (BID) | ORAL | Status: DC
  Administered 2024-01-13 – 2024-01-16 (×5): 100 mg via ORAL
  Filled 2024-01-13 (×6): qty 1

## 2024-01-13 MED ORDER — SODIUM CHLORIDE 0.9% IV SOLUTION: Freq: Once | INTRAVENOUS | Status: AC

## 2024-01-13 MED ORDER — ONDANSETRON HCL 4 MG PO TABS: 4.0000 mg | ORAL_TABLET | Freq: Four times a day (QID) | ORAL | Status: DC | PRN

## 2024-01-13 MED ORDER — FUROSEMIDE 10 MG/ML IJ SOLN: 20.0000 mg | Freq: Once | INTRAMUSCULAR | Status: DC

## 2024-01-13 MED ORDER — MIDODRINE HCL 5 MG PO TABS
10.0000 mg | ORAL_TABLET | Freq: Two times a day (BID) | ORAL | Status: DC
  Administered 2024-01-14 – 2024-01-16 (×5): 10 mg via ORAL
  Filled 2024-01-13 (×5): qty 2

## 2024-01-13 MED ORDER — ACETAMINOPHEN 325 MG PO TABS: 650.0000 mg | ORAL_TABLET | Freq: Four times a day (QID) | ORAL | Status: DC | PRN

## 2024-01-13 MED ORDER — RENA-VITE PO TABS
1.0000 | ORAL_TABLET | Freq: Every day | ORAL | Status: DC
  Administered 2024-01-14 – 2024-01-16 (×2): 1 via ORAL
  Filled 2024-01-13 (×3): qty 1

## 2024-01-13 MED ORDER — CHLORHEXIDINE GLUCONATE CLOTH 2 % EX PADS
6.0000 | MEDICATED_PAD | Freq: Every day | CUTANEOUS | Status: DC
  Administered 2024-01-14 – 2024-01-16 (×3): 6 via TOPICAL

## 2024-01-13 MED ORDER — ACETAMINOPHEN 650 MG RE SUPP: 650.0000 mg | Freq: Four times a day (QID) | RECTAL | Status: DC | PRN

## 2024-01-13 MED ORDER — FUROSEMIDE 10 MG/ML IJ SOLN
20.0000 mg | Freq: Once | INTRAMUSCULAR | Status: AC
  Administered 2024-01-13: 20 mg via INTRAVENOUS
  Filled 2024-01-13: qty 2

## 2024-01-13 MED ORDER — PANTOPRAZOLE SODIUM 40 MG IV SOLR
40.0000 mg | Freq: Once | INTRAVENOUS | Status: AC
  Administered 2024-01-13: 40 mg via INTRAVENOUS
  Filled 2024-01-13: qty 10

## 2024-01-13 MED ORDER — ONDANSETRON HCL 4 MG/2ML IJ SOLN
4.0000 mg | Freq: Four times a day (QID) | INTRAMUSCULAR | Status: DC | PRN
  Administered 2024-01-14: 4 mg via INTRAVENOUS
  Filled 2024-01-13: qty 2

## 2024-01-13 MED ORDER — POLYETHYLENE GLYCOL 3350 17 G PO PACK: 17.0000 g | PACK | Freq: Every day | ORAL | Status: DC | PRN

## 2024-01-13 MED ORDER — PANTOPRAZOLE SODIUM 40 MG IV SOLR
40.0000 mg | Freq: Two times a day (BID) | INTRAVENOUS | Status: DC
  Administered 2024-01-14 – 2024-01-16 (×4): 40 mg via INTRAVENOUS
  Filled 2024-01-13 (×5): qty 10

## 2024-01-13 NOTE — Progress Notes (Addendum)
 Pt receives out-pt HD at [facility] dialysis, mwf, 0600 chair time. Clinic informed of pt arrival. Did well on tx, states that pt is confused and poor historian. Per [facility], she received her full tx today in clinic.  Will continue to assist as needed.    Massiah Minjares Dialysis Navigator [phone] [facility]  #[phone]

## 2024-01-13 NOTE — ED Triage Notes (Signed)
 Pt BIB [facility] from [facility]. Pt's labs drawn on the 27th of October indicated hemoglobin of 5.5. Pt unable to state month or year upon arrival, oriented to self and place. Lives at assisted living facility.

## 2024-01-13 NOTE — ED Notes (Signed)
 CSW reviewed patient chart. ICM if familiar with this patient. CSW attempted to contact [facility]  [facility] and had to leave a confidential VM. Unable to assess. Patient is from a Group Home called [facility]. ICM will follow patient and attempt to reach [facility].

## 2024-01-13 NOTE — ED Provider Notes (Signed)
 [place] [facility] AT [facility] Provider Note  CSN: [id number] Arrival date & time: 01/13/24 1146  Chief Complaint(s) Low Hemoglobin  HPI Arnold Depinto is a 48 y.o. female with past medical history as below, significant for ESRD on HD Monday Wednesday Friday, hypertension, depression, GI bleed, depression who presents to the ED with complaint of low hemoglobin  HD Monday Wednesday Friday.  Pt reports she did not receive her dialysis this morning, reports that her hemoglobin was checked and she was told it was low and was sent to the ER for evaluation.  Patient has no complaints other than being hungry/tired.  No dyspnea or lightheadedness, syncope, denies any bleeding that she is aware of.  No melena.  No increased bruising.  No hemoptysis.  She denies prior blood transfusion in the past.  Per chart review pt admitted earlier this month with GIB, anemia, given 2uPRBC and gi did eval pt, see GI consult note but pt was found to have multiple AVMs on colonoscopy that were repaired. She also apparently did receive HD this am    Past Medical History Past Medical History:  Diagnosis Date   Anemia    BV (bacterial vaginosis) 11/08/2013   Depression    Dialysis patient    DVT (deep venous thrombosis) (HCC)    Hypertension    Lupus    Renal disorder    Vaginal odor 11/08/2013   Patient Active Problem List   Diagnosis Date Noted   AVM (arteriovenous malformation) of colon with hemorrhage 12/26/2023   Chronic kidney disease 12/24/2023   Acute on chronic anemia 12/23/2023   Hypokalemia 12/23/2023   Hypophosphatemia 12/23/2023   Acute GI bleeding 12/08/2023   Upper GI bleed 12/07/2023   Acute anemia 12/07/2023   Encounter for screening fecal occult blood testing 07/01/2022   Encounter for gynecological examination with Papanicolaou smear of cervix 07/01/2022   Hemorrhage from arteriovenous dialysis graft 02/08/2022   Chronic hypotension 02/08/2022    Diabetes (HCC) 11/02/2018   Lipoma of extremity    Hyperkalemia 10/23/2016   Complication of vascular access for dialysis 04/01/2016   Seizure disorder (HCC) 10/01/2015   Generalized muscle weakness 10/01/2015   Vaginal discharge 11/08/2013   Vaginal odor 11/08/2013   BV (bacterial vaginosis) 11/08/2013   ESRD on dialysis (HCC) 12/27/2010   Anemia in chronic kidney disease 12/27/2010   Major depressive disorder with single episode 12/27/2010   Secondary hyperparathyroidism of renal origin 12/27/2010   Systemic lupus erythematosus, organ or system involvement unspecified (HCC) 12/27/2010   Type 2 diabetes mellitus without complication 12/27/2010   SWELLING, NECK 04/02/2007   Asthma 03/15/2007   FOOT PAIN, RIGHT 03/15/2007   Cough 03/15/2007   MENTAL RETARDATION, MODERATE 01/19/2007   STATUS, MENTAL, ALTERED 07/16/2006   ANOREXIA 05/26/2006   Disease of pericardium 05/15/2006   Tachycardia 05/15/2006   Lupus 04/01/2006   ANEMIA-NOS 03/31/2006   LEUKOCYTOSIS 03/31/2006   Depression with anxiety 03/31/2006   Migraine headache 03/31/2006   Hereditary and idiopathic peripheral neuropathy 03/31/2006   Essential (primary) hypertension 03/31/2006   GERD 03/31/2006   Osteoarthritis 03/31/2006   Other specified abnormal findings of blood chemistry 03/31/2006   Home Medication(s) Prior to Admission medications   Medication Sig Start Date End Date Taking? Authorizing Provider  albuterol  (PROVENTIL ) (2.5 MG/3ML) 0.083% nebulizer solution Take 2.5 mg by nebulization 4 (four) times daily as needed for wheezing or shortness of breath.     [provider]  ALPRAZolam  (XANAX ) 0.25 MG tablet  Take 0.25 mg by mouth 2 (two) times daily.    [provider]  ammonium lactate  (AMLACTIN DAILY) 12 % lotion Apply 1 Application topically as needed for dry skin. 04/23/23   [name], DPM  aspirin EC 81 MG tablet Take 81 mg by mouth daily. Swallow whole.    [provider]   Calcium Carbonate Antacid (TUMS PO) Take 1,000 mg by mouth 2 (two) times daily between meals. 06/13/22   [provider]  chlorhexidine  (PERIDEX ) 0.12 % solution Use as directed 15 mLs in the mouth or throat 2 (two) times daily. After regular toothbrushing, rinse mouth 08/20/22   [provider]  clopidogrel  (PLAVIX ) 75 MG tablet Take 1 tablet (75 mg total) by mouth daily. 12/28/23   Darci Pore, MD  cycloSPORINE (RESTASIS) 0.05 % ophthalmic emulsion Place 1 drop into both eyes 2 (two) times daily. 01/31/21   [provider]  docusate sodium  (COLACE) 100 MG capsule Take 100 mg by mouth daily.    [provider]  epoetin  alfa (EPOGEN ,PROCRIT) 4000 UNIT/ML injection Inject 4,000 Units into the vein See admin instructions. Mon, Wed, and Friday at dialysis    [provider]  guaiFENesin  (ROBITUSSIN) 100 MG/5ML liquid Take 200 mg by mouth every 6 (six) hours as needed for cough.     [provider]  hydroxychloroquine  (PLAQUENIL ) 200 MG tablet Take 200 mg by mouth daily.    [provider]  ipratropium (ATROVENT ) 0.02 % nebulizer solution Take 0.5 mg by nebulization 4 (four) times daily as needed for wheezing or shortness of breath (For Breathing).    [provider]  lidocaine -prilocaine  (EMLA ) cream Apply 1 application topically as needed (port access).  12/20/12   [provider]  loratadine (CLARITIN) 10 MG tablet Take 10 mg by mouth daily as needed for allergies.    [provider]  midodrine  (PROAMATINE ) 10 MG tablet Take 10 mg by mouth 2 (two) times daily.     [provider]  mirtazapine  (REMERON ) 15 MG tablet Take 7.5 mg by mouth at bedtime.  11/03/13   [provider]  multivitamin (RENA-VIT) TABS tablet Take 1 tablet by mouth daily.    [provider]  pantoprazole  (PROTONIX ) 40 MG tablet Take 40 mg by mouth daily.    [provider]  phenytoin  (DILANTIN ) 100 MG  ER capsule Take 100 mg by mouth 2 (two) times daily.     [provider]  sertraline  (ZOLOFT ) 100 MG tablet Take 200 mg by mouth daily.     [provider]  simvastatin  (ZOCOR ) 40 MG tablet Take 40 mg by mouth at bedtime.    [provider]                                                                                                                                    Past Surgical History Past Surgical History:  Procedure  Laterality Date   A/V SHUNT INTERVENTION N/A 10/24/2016   Procedure: A/V SHUNT INTERVENTION;  Surgeon: Jama Cordella MATSU, MD;  Location: [facility];  Service: Cardiovascular;  Laterality: N/A;   A/V SHUNT INTERVENTION Left 09/15/2023   Procedure: A/V SHUNT INTERVENTION;  Surgeon: Lanis Fonda BRAVO, MD;  Location: [facility];  Service: Cardiovascular;  Laterality: Left;   A/V SHUNTOGRAM Left 12/08/2017   Procedure: A/V SHUNTOGRAM;  Surgeon: Jama Cordella MATSU, MD;  Location: [facility];  Service: Cardiovascular;  Laterality: Left;   A/V SHUNTOGRAM Left 01/28/2018   Procedure: A/V SHUNTOGRAM;  Surgeon: [name], MD;  Location: [facility];  Service: Cardiovascular;  Laterality: Left;   A/V SHUNTOGRAM Left 04/01/2018   Procedure: A/V SHUNTOGRAM;  Surgeon: [name], MD;  Location: [facility];  Service: Cardiovascular;  Laterality: Left;   A/V SHUNTOGRAM Left 04/28/2019   Procedure: A/V SHUNTOGRAM;  Surgeon: [name], MD;  Location: [facility];  Service: Cardiovascular;  Laterality: Left;   AV FISTULA PLACEMENT     AV FISTULA PLACEMENT Left 02/09/2022   Procedure: FISTULAGRAM, placement of left arm stent.;  Surgeon: [name], MD;  Location: [facility];  Service: Vascular;  Laterality: Left;   AV FISTULA PLACEMENT Right 09/02/2022   Procedure: EXPLORATION OF RIGHT AXILLA, INADEQUATE VEIN ACCESS;  Surgeon: Oris Krystal FALCON, MD;  Location: [facility];  Service: Vascular;  Laterality: Right;   AV FISTULA  PLACEMENT Left 09/25/2022   Procedure: INSERTION OF LEFT FEMORAL LOOP GRAFT USING 4-38mm GORETEX STRETCH GRAFT;  Surgeon: [name], MD;  Location: [facility];  Service: Vascular;  Laterality: Left;   COLONOSCOPY N/A 12/25/2023   Procedure: COLONOSCOPY;  Surgeon: Cindie Carlin POUR, DO;  Location: [facility];  Service: Endoscopy;  Laterality: N/A;   DIALYSIS/PERMA CATHETER INSERTION  10/23/2016   Procedure: DIALYSIS/PERMA CATHETER INSERTION;  Surgeon: [name], MD;  Location: [facility];  Service: Cardiovascular;;   ESOPHAGOGASTRODUODENOSCOPY N/A 12/08/2023   Procedure: EGD (ESOPHAGOGASTRODUODENOSCOPY);  Surgeon: Eartha Flavors, Toribio, MD;  Location: [facility];  Service: Gastroenterology;  Laterality: N/A;   IR AV DIALY SHUNT INTRO NEEDLE/INTRACATH INITIAL W/PTA/IMG LEFT  05/12/2023   IR REMOVAL TUN CV CATH W/O FL  09/22/2017   IR REMOVAL TUN CV CATH W/O FL  01/06/2023   IR THROMBECTOMY AV FISTULA W/THROMBOLYSIS INC/SHUNT/IMG LEFT Left 09/04/2017   IR US  GUIDE VASC ACCESS LEFT  09/04/2017   IR US  GUIDE VASC ACCESS LEFT  05/12/2023   MASS EXCISION Left 06/01/2018   Procedure: EXCISION 5 X 3CM LIPOMA LEFT THIGH;  Surgeon: [name], MD;  Location: [facility];  Service: General;  Laterality: Left;   PERIPHERAL VASCULAR CATHETERIZATION Left 01/04/2015   Procedure: A/V Shuntogram/Fistulagram;  Surgeon: Selinda RAMAN Marea, MD;  Location: [facility];  Service: Cardiovascular;  Laterality: Left;   PERIPHERAL VASCULAR CATHETERIZATION N/A 01/04/2015   Procedure: A/V Shunt Intervention;  Surgeon: Selinda RAMAN Marea, MD;  Location: [facility];  Service: Cardiovascular;  Laterality: N/A;   PERIPHERAL VASCULAR CATHETERIZATION Left 04/13/2015   Procedure: A/V Shuntogram/Fistulagram;  Surgeon: Cordella MATSU Jama, MD;  Location: [facility];  Service: Cardiovascular;  Laterality: Left;   PERIPHERAL VASCULAR CATHETERIZATION Left 04/13/2015   Procedure: A/V Shunt Intervention;  Surgeon:  Cordella MATSU Jama, MD;  Location: [facility];  Service: Cardiovascular;  Laterality: Left;   REMOVAL OF A DIALYSIS CATHETER Left 09/25/2022  Procedure: REMOVAL OF LEFT INTERNAL JUGULAR HERO CATHETER;  Surgeon: [name], MD;  Location: [facility];  Service: Vascular;  Laterality: Left;   UPPER EXTREMITY VENOGRAPHY N/A 08/26/2022   Procedure: UPPER EXTREMITY VENOGRAPHY;  Surgeon: [name], MD;  Location: [facility];  Service: Cardiovascular;  Laterality: N/A;   VENOUS ANGIOPLASTY  09/15/2023   Procedure: VENOUS ANGIOPLASTY;  Surgeon: Lanis Fonda BRAVO, MD;  Location: [facility];  Service: Cardiovascular;;  leg loop graft   Family History Family History  Problem Relation Age of Onset   Seizures Son    Kidney disease Mother        on dialysis   Lupus Mother     Social History Social History   Tobacco Use   Smoking status: Every Day    Current packs/day: 0.10    Average packs/day: 0.1 packs/day for 15.0 years (1.5 ttl pk-yrs)    Types: Cigarettes    Passive exposure: Current   Smokeless tobacco: Never  Vaping Use   Vaping status: Never Used  Substance Use Topics   Alcohol  use: No   Drug use: No   Allergies Dust mite extract  Review of Systems A thorough review of systems was obtained and all systems are negative except as noted in the HPI and PMH.   Physical Exam Vital Signs  I have reviewed the triage vital signs BP 113/80   Pulse 77   Temp 98.1 F (36.7 C) (Oral)   Resp 13   Ht 5' 2 (1.575 m)   Wt 39.8 kg   SpO2 100%   BMI 16.05 kg/m  Physical Exam Vitals and nursing note reviewed.  Constitutional:      General: She is not in acute distress.    Appearance: Normal appearance.  HENT:     Head: Normocephalic and atraumatic.     Right Ear: External ear normal.     Left Ear: External ear normal.     Nose: Nose normal.     Mouth/Throat:     Mouth: Mucous membranes are moist.  Eyes:     General: No scleral icterus.       Right eye: No  discharge.        Left eye: No discharge.  Cardiovascular:     Rate and Rhythm: Normal rate and regular rhythm.     Pulses: Normal pulses.     Heart sounds: Normal heart sounds.  Pulmonary:     Effort: Pulmonary effort is normal. No respiratory distress.     Breath sounds: Normal breath sounds. No stridor.  Abdominal:     General: Abdomen is flat. There is no distension.     Palpations: Abdomen is soft.     Tenderness: There is no abdominal tenderness.  Musculoskeletal:     Cervical back: No rigidity.     Right lower leg: No edema.     Left lower leg: No edema.  Skin:    General: Skin is warm and dry.     Capillary Refill: Capillary refill takes less than 2 seconds.  Neurological:     Mental Status: She is oriented to person, place, and time. She is lethargic.     GCS: GCS eye subscore is 4. GCS verbal subscore is 5. GCS motor subscore is 6.     Comments: Sleepy but appears to answer questions appropriately   Psychiatric:        Mood and Affect: Mood normal.        Behavior:  Behavior normal. Behavior is cooperative.     ED Results and Treatments Labs (all labs ordered are listed, but only abnormal results are displayed) Labs Reviewed  CBC WITH DIFFERENTIAL/PLATELET - Abnormal; Notable for the following components:      Result Value   RBC 2.10 (*)    Hemoglobin 5.9 (*)    HCT 18.0 (*)    RDW 15.9 (*)    All other components within normal limits  COMPREHENSIVE METABOLIC PANEL WITH GFR - Abnormal; Notable for the following components:   Potassium 3.3 (*)    Chloride 97 (*)    Creatinine, Ser 2.52 (*)    Calcium 8.5 (*)    Total Protein 6.3 (*)    GFR, Estimated 23 (*)    All other components within normal limits  VITAMIN B12 - Abnormal; Notable for the following components:   Vitamin B-12 1,207 (*)    All other components within normal limits  IRON AND TIBC - Abnormal; Notable for the following components:   TIBC 224 (*)    All other components within normal limits   FERRITIN - Abnormal; Notable for the following components:   Ferritin 549 (*)    All other components within normal limits  RETICULOCYTES - Abnormal; Notable for the following components:   RBC. 2.07 (*)    Immature Retic Fract 25.8 (*)    All other components within normal limits  FOLATE  OCCULT BLOOD X 1 CARD TO LAB, STOOL  HEPATITIS B SURFACE ANTIGEN  HEPATITIS B SURFACE ANTIBODY, QUANTITATIVE  TYPE AND SCREEN  PREPARE RBC (CROSSMATCH)                                                                                                                          Radiology No results found.  Pertinent labs & imaging results that were available during my care of the patient were reviewed by me and considered in my medical decision making (see MDM for details).  Medications Ordered in ED Medications  Chlorhexidine  Gluconate Cloth 2 % PADS 6 each (has no administration in time range)  furosemide (LASIX) injection 20 mg (has no administration in time range)  pantoprazole  (PROTONIX ) injection 40 mg (has no administration in time range)  0.9 %  sodium chloride  infusion (Manually program via Guardrails IV Fluids) ( Intravenous New Bag/Given 01/13/24 1502)  Procedures .Critical Care  Performed by: Elnor Jayson LABOR, DO Authorized by: Elnor Jayson LABOR, DO   Critical care provider statement:    Critical care time (minutes):  35   Critical care time was exclusive of:  Separately billable procedures and treating other patients   Critical care was necessary to treat or prevent imminent or life-threatening deterioration of the following conditions:  Circulatory failure   Critical care was time spent personally by me on the following activities:  Development of treatment plan with patient or surrogate, discussions with consultants, evaluation of patient's response to  treatment, examination of patient, ordering and review of laboratory studies, ordering and review of radiographic studies, ordering and performing treatments and interventions, pulse oximetry, re-evaluation of patient's condition, review of old charts and obtaining history from patient or surrogate   Care discussed with: admitting provider     (including critical care time)  Medical Decision Making / ED Course    Medical Decision Making:    Kylea Berrong is a 48 y.o. female  with past medical history as below, significant for ESRD on HD Monday Wednesday Friday, hypertension, depression, GI bleed, depression who presents to the ED with complaint of low hemoglobin. The complaint involves an extensive differential diagnosis and also carries with it a high risk of complications and morbidity.  Serious etiology was considered. Ddx includes but is not limited to: Acute blood loss anemia, GI bleed, iron deficiency, folate or B12 deficiency, anemia chronic disease, etc.  Complete initial physical exam performed, notably the patient was in no acute distress.    Reviewed and confirmed nursing documentation for past medical history, family history, social history.  Vital signs reviewed.    Symptomatic anemia  Presumed GIB > - Patient history of GI bleed, recent admission for the same, colonoscopy with multiple AVMs that were evaluated by GI. - Patient denies any evidence of bleeding but she is a poor historian, she refused rectal exam. - Hemoglobin is low, send anemia panel, order PRBC, patient is agreeable.  Start prophylactic PPI. - Spoke with Dr. Oliver / renal; will put pt on list for HD, nonemergent - admit [facility] for symptomatic anemia, presumed GI bleed - will hold off on GI consult at this time   Clinical Course as of 01/13/24 1644  Wed Jan 13, 2024  1457 Pt refused rectal exam  Denies melena  She has hx of GIB, given protonix  ppx  Pt reports she will notify staff when she  needs to have a BM [SG]  1501   During recent admit hgb 5.3,  Patient got 2 units of blood transfusion, had Colonoscopy which showed multiple AVMs, bleeding ones coagulated. Lesion in sigmoid colon oozing blood, clip placed with hemostasis.  GI advised to restart Plavix  in 2 days.  Her hemoglobin remained stable, able to tolerate diet and is hemodynamically stable to be discharged home.  [SG]    Clinical Course User Index [SG] Elnor Jayson LABOR, DO                    Additional history obtained: -Additional history obtained from na -External records from outside source obtained and reviewed including: Chart review including previous notes, labs, imaging, consultation notes including  Recent admit Prior labs Recent gi documentation   Lab Tests: -I ordered, reviewed, and interpreted labs.   The pertinent results include:   Labs Reviewed  CBC WITH DIFFERENTIAL/PLATELET - Abnormal; Notable for the following components:      Result Value  RBC 2.10 (*)    Hemoglobin 5.9 (*)    HCT 18.0 (*)    RDW 15.9 (*)    All other components within normal limits  COMPREHENSIVE METABOLIC PANEL WITH GFR - Abnormal; Notable for the following components:   Potassium 3.3 (*)    Chloride 97 (*)    Creatinine, Ser 2.52 (*)    Calcium 8.5 (*)    Total Protein 6.3 (*)    GFR, Estimated 23 (*)    All other components within normal limits  VITAMIN B12 - Abnormal; Notable for the following components:   Vitamin B-12 1,207 (*)    All other components within normal limits  IRON AND TIBC - Abnormal; Notable for the following components:   TIBC 224 (*)    All other components within normal limits  FERRITIN - Abnormal; Notable for the following components:   Ferritin 549 (*)    All other components within normal limits  RETICULOCYTES - Abnormal; Notable for the following components:   RBC. 2.07 (*)    Immature Retic Fract 25.8 (*)    All other components within normal limits  FOLATE   OCCULT BLOOD X 1 CARD TO LAB, STOOL  HEPATITIS B SURFACE ANTIGEN  HEPATITIS B SURFACE ANTIBODY, QUANTITATIVE  TYPE AND SCREEN  PREPARE RBC (CROSSMATCH)    Notable for as above, hgb low  EKG   EKG Interpretation Date/Time:    Ventricular Rate:    PR Interval:    QRS Duration:    QT Interval:    QTC Calculation:   R Axis:      Text Interpretation:           Imaging Studies ordered: na   Medicines ordered and prescription drug management: Meds ordered this encounter  Medications   0.9 %  sodium chloride  infusion (Manually program via Guardrails IV Fluids)   DISCONTD: furosemide (LASIX) injection 20 mg   DISCONTD: pantoprazole  (PROTONIX ) injection 40 mg   Chlorhexidine  Gluconate Cloth 2 % PADS 6 each   furosemide (LASIX) injection 20 mg   pantoprazole  (PROTONIX ) injection 40 mg    -I have reviewed the patients home medicines and have made adjustments as needed   Consultations Obtained: I requested consultation with the renal, [facility],  and discussed lab and imaging findings as well as pertinent plan    Cardiac Monitoring: The patient was maintained on a cardiac monitor.  I personally viewed and interpreted the cardiac monitored which showed an underlying rhythm of: nsr Continuous pulse oximetry interpreted by myself, 100% on RA.    Social Determinants of Health:  Diagnosis or treatment significantly limited by social determinants of health: current smoker   Reevaluation: After the interventions noted above, I reevaluated the patient and found that they have stayed the same  Co morbidities that complicate the patient evaluation  Past Medical History:  Diagnosis Date   Anemia    BV (bacterial vaginosis) 11/08/2013   Depression    Dialysis patient    DVT (deep venous thrombosis) (HCC)    Hypertension    Lupus    Renal disorder    Vaginal odor 11/08/2013      Dispostion: Disposition decision including need for hospitalization was considered, and  patient admitted to the hospital.    Final Clinical Impression(s) / ED Diagnoses Final diagnoses:  Symptomatic anemia  ESRD on hemodialysis (HCC)        Elnor Jayson LABOR, DO 01/13/24 1644

## 2024-01-13 NOTE — ED Notes (Signed)
 Date and time results received: 01/13/24 1411   Test: Heme Critical Value: 5.9  Name of Provider Notified: Elnor

## 2024-01-13 NOTE — H&P (Signed)
 History and Physical    [name] FMW:[id number] DOB: [birth year] DOA: 01/13/2024  PCP: Gammon, Chrystal, NP   Patient coming from: ALF >> [facility]  I have personally briefly reviewed patient's old medical records in [facility]  Chief Complaint: Low hemoglobin  HPI: [name] is a 48 y.o. female with medical history significant for anemia, AVMs, ESRD on dialysis Monday Wednesday Friday, diabetes mellitus, lupus, seizure disorder, intellectual disabilities. Patient was sent to the ED from dialysis center with reports of low hemoglobin of 5.5.  Social work confirmed that patient completed dialysis today, and patient is confused and a poor historian, as patient stated otherwise on arrival to the ED and on my evaluation.  Patient denies any difficulty breathing or fatigue at rest or with exertion, no chest pain no lower extremity swelling.  She denies black stools, no blood in stools, no vomiting, no abdominal pain.  She denies NSAID use.  Recent hospitalization 10/8 to 10/11, she was sent to the ED with hemoglobin of 5.9.  She received 2 units of blood, hemoglobin was 9 on discharge.  Colonoscopy done showed multiple AVMs, 1 actively bleeding.  Treated with coagulation.  Lesion in sigmoid colon oozing blood clip placed with hemostasis.  Was also hospitalized 9/22- 9/25, hemoglobin was down to 7, had endoscopy which showed gastritis, hemoglobin improved and she was discharged.  ED Course: Stable vitals.  Hemoglobin 5.9.  1 unit PRBC transfused. Nephrology consulted.  Review of Systems: As per HPI all other systems reviewed and negative.  Past Medical History:  Diagnosis Date   Anemia    BV (bacterial vaginosis) 11/08/2013   Depression    Dialysis patient    DVT (deep venous thrombosis) (HCC)    Hypertension    Lupus    Renal disorder    Vaginal odor 11/08/2013    Past Surgical History:  Procedure Laterality Date   A/V SHUNT INTERVENTION N/A  10/24/2016   Procedure: A/V SHUNT INTERVENTION;  Surgeon: Jama Cordella MATSU, MD;  Location: [facility];  Service: Cardiovascular;  Laterality: N/A;   A/V SHUNT INTERVENTION Left 09/15/2023   Procedure: A/V SHUNT INTERVENTION;  Surgeon: Lanis Fonda BRAVO, MD;  Location: [facility];  Service: Cardiovascular;  Laterality: Left;   A/V SHUNTOGRAM Left 12/08/2017   Procedure: A/V SHUNTOGRAM;  Surgeon: Jama Cordella MATSU, MD;  Location: [facility];  Service: Cardiovascular;  Laterality: Left;   A/V SHUNTOGRAM Left 01/28/2018   Procedure: A/V SHUNTOGRAM;  Surgeon: [name], MD;  Location: [facility];  Service: Cardiovascular;  Laterality: Left;   A/V SHUNTOGRAM Left 04/01/2018   Procedure: A/V SHUNTOGRAM;  Surgeon: [name], MD;  Location: [facility];  Service: Cardiovascular;  Laterality: Left;   A/V SHUNTOGRAM Left 04/28/2019   Procedure: A/V SHUNTOGRAM;  Surgeon: [name], MD;  Location: [facility];  Service: Cardiovascular;  Laterality: Left;   AV FISTULA PLACEMENT     AV FISTULA PLACEMENT Left 02/09/2022   Procedure: FISTULAGRAM, placement of left arm stent.;  Surgeon: [name], MD;  Location: [facility] OR;  Service: Vascular;  Laterality: Left;   AV FISTULA PLACEMENT Right 09/02/2022   Procedure: EXPLORATION OF RIGHT AXILLA, INADEQUATE VEIN ACCESS;  Surgeon: Oris Krystal FALCON, MD;  Location: [facility]S;  Service: Vascular;  Laterality: Right;   AV FISTULA PLACEMENT Left 09/25/2022   Procedure: INSERTION OF LEFT FEMORAL LOOP GRAFT USING 4-20mm GORETEX STRETCH GRAFT;  Surgeon: Serene,  Gaile ORN, MD;  Location: [facility] OR;  Service: Vascular;  Laterality: Left;   COLONOSCOPY N/A 12/25/2023   Procedure: COLONOSCOPY;  Surgeon: Cindie Carlin POUR, DO;  Location: [facility];  Service: Endoscopy;  Laterality: N/A;   DIALYSIS/PERMA CATHETER INSERTION  10/23/2016   Procedure: DIALYSIS/PERMA CATHETER INSERTION;  Surgeon: [name], MD;  Location: [facility]  [facility];  Service: Cardiovascular;;   ESOPHAGOGASTRODUODENOSCOPY N/A 12/08/2023   Procedure: EGD (ESOPHAGOGASTRODUODENOSCOPY);  Surgeon: Eartha Flavors, Toribio, MD;  Location: [facility];  Service: Gastroenterology;  Laterality: N/A;   IR AV DIALY SHUNT INTRO NEEDLE/INTRACATH INITIAL W/PTA/IMG LEFT  05/12/2023   IR REMOVAL TUN CV CATH W/O FL  09/22/2017   IR REMOVAL TUN CV CATH W/O FL  01/06/2023   IR THROMBECTOMY AV FISTULA W/THROMBOLYSIS INC/SHUNT/IMG LEFT Left 09/04/2017   IR US  GUIDE VASC ACCESS LEFT  09/04/2017   IR US  GUIDE VASC ACCESS LEFT  05/12/2023   MASS EXCISION Left 06/01/2018   Procedure: EXCISION 5 X 3CM LIPOMA LEFT THIGH;  Surgeon: [name], MD;  Location: [facility];  Service: General;  Laterality: Left;   PERIPHERAL VASCULAR CATHETERIZATION Left 01/04/2015   Procedure: A/V Shuntogram/Fistulagram;  Surgeon: Selinda RAMAN Marea, MD;  Location: [facility];  Service: Cardiovascular;  Laterality: Left;   PERIPHERAL VASCULAR CATHETERIZATION N/A 01/04/2015   Procedure: A/V Shunt Intervention;  Surgeon: Selinda RAMAN Marea, MD;  Location: [facility];  Service: Cardiovascular;  Laterality: N/A;   PERIPHERAL VASCULAR CATHETERIZATION Left 04/13/2015   Procedure: A/V Shuntogram/Fistulagram;  Surgeon: [name], MD;  Location: [facility];  Service: Cardiovascular;  Laterality: Left;   PERIPHERAL VASCULAR CATHETERIZATION Left 04/13/2015   Procedure: A/V Shunt Intervention;  Surgeon: [name], MD;  Location: [facility];  Service: Cardiovascular;  Laterality: Left;   REMOVAL OF A DIALYSIS CATHETER Left 09/25/2022   Procedure: REMOVAL OF LEFT INTERNAL JUGULAR HERO CATHETER;  Surgeon: [name], MD;  Location: [facility] OR;  Service: Vascular;  Laterality: Left;   UPPER EXTREMITY VENOGRAPHY N/A 08/26/2022   Procedure: UPPER EXTREMITY VENOGRAPHY;  Surgeon: [name], MD;  Location: [facility];  Service: Cardiovascular;  Laterality: N/A;   VENOUS  ANGIOPLASTY  09/15/2023   Procedure: VENOUS ANGIOPLASTY;  Surgeon: Lanis Fonda BRAVO, MD;  Location: [facility];  Service: Cardiovascular;;  leg loop graft     reports that she has been smoking cigarettes. She has a 1.5 pack-year smoking history. She has been exposed to tobacco smoke. She has never used smokeless tobacco. She reports that she does not drink alcohol  and does not use drugs.  Allergies  Allergen Reactions   Dust Mite Extract Other (See Comments)    sneezing    Family History  Problem Relation Age of Onset   Seizures Son    Kidney disease Mother        on dialysis   Lupus Mother     Prior to Admission medications   Medication Sig Start Date End Date Taking? Authorizing Provider  albuterol  (PROVENTIL ) (2.5 MG/3ML) 0.083% nebulizer solution Take 2.5 mg by nebulization 4 (four) times daily as needed for wheezing or shortness of breath.     [provider]  ALPRAZolam  (XANAX ) 0.25 MG tablet Take 0.25 mg by mouth 2 (two) times daily.    [provider]  ammonium lactate  (AMLACTIN DAILY) 12 % lotion Apply 1 Application topically as needed for dry skin. 04/23/23   [name],  DPM  aspirin EC 81 MG tablet Take 81 mg by mouth daily. Swallow whole.    [provider]  Calcium Carbonate Antacid (TUMS PO) Take 1,000 mg by mouth 2 (two) times daily between meals. 06/13/22   [provider]  chlorhexidine  (PERIDEX ) 0.12 % solution Use as directed 15 mLs in the mouth or throat 2 (two) times daily. After regular toothbrushing, rinse mouth 08/20/22   [provider]  clopidogrel  (PLAVIX ) 75 MG tablet Take 1 tablet (75 mg total) by mouth daily. 12/28/23   Darci Pore, MD  cycloSPORINE (RESTASIS) 0.05 % ophthalmic emulsion Place 1 drop into both eyes 2 (two) times daily. 01/31/21   [provider]  docusate sodium  (COLACE) 100 MG capsule Take 100 mg by mouth daily.    [provider]  epoetin  alfa (EPOGEN ,PROCRIT) 4000  UNIT/ML injection Inject 4,000 Units into the vein See admin instructions. Mon, Wed, and Friday at dialysis    [provider]  guaiFENesin  (ROBITUSSIN) 100 MG/5ML liquid Take 200 mg by mouth every 6 (six) hours as needed for cough.     [provider]  hydroxychloroquine  (PLAQUENIL ) 200 MG tablet Take 200 mg by mouth daily.    [provider]  ipratropium (ATROVENT ) 0.02 % nebulizer solution Take 0.5 mg by nebulization 4 (four) times daily as needed for wheezing or shortness of breath (For Breathing).    [provider]  lidocaine -prilocaine  (EMLA ) cream Apply 1 application topically as needed (port access).  12/20/12   [provider]  loratadine (CLARITIN) 10 MG tablet Take 10 mg by mouth daily as needed for allergies.    [provider]  midodrine  (PROAMATINE ) 10 MG tablet Take 10 mg by mouth 2 (two) times daily.     [provider]  mirtazapine  (REMERON ) 15 MG tablet Take 7.5 mg by mouth at bedtime.  11/03/13   [provider]  multivitamin (RENA-VIT) TABS tablet Take 1 tablet by mouth daily.    [provider]  pantoprazole  (PROTONIX ) 40 MG tablet Take 40 mg by mouth daily.    [provider]  phenytoin  (DILANTIN ) 100 MG ER capsule Take 100 mg by mouth 2 (two) times daily.     [provider]  sertraline  (ZOLOFT ) 100 MG tablet Take 200 mg by mouth daily.     [provider]  simvastatin  (ZOCOR ) 40 MG tablet Take 40 mg by mouth at bedtime.    [provider]    Physical Exam: Vitals:   01/13/24 1445 01/13/24 1500 01/13/24 1516 01/13/24 1517  BP: (!) 106/52 108/75 113/80 113/80  Pulse: 80 78 77   Resp: 14 12 13    Temp: 97.9 F (36.6 C) 97.9 F (36.6 C) 98.1 F (36.7 C) 98.1 F (36.7 C)  TempSrc: Oral Oral Oral Oral  SpO2: 100% 100% 100%   Weight:      Height:        Constitutional: NAD, calm, comfortable Vitals:   01/13/24 1445 01/13/24 1500 01/13/24 1516 01/13/24  1517  BP: (!) 106/52 108/75 113/80 113/80  Pulse: 80 78 77   Resp: 14 12 13    Temp: 97.9 F (36.6 C) 97.9 F (36.6 C) 98.1 F (36.7 C) 98.1 F (36.7 C)  TempSrc: Oral Oral Oral Oral  SpO2: 100% 100% 100%   Weight:      Height:       Eyes: PERRL, lids and conjunctivae normal ENMT: Mucous membranes are moist.  Neck: normal, supple, no masses, no thyromegaly  Respiratory: clear to auscultation bilaterally, no wheezing, no crackles.  Cardiovascular: Regular rate and rhythm, no murmurs / rubs / gallops.  Extremities warm. Abdomen: no tenderness, no masses palpated. No hepatosplenomegaly. Bowel sounds positive.  Musculoskeletal: no clubbing / cyanosis. No joint deformity upper and lower extremities.  Skin: no rashes, lesions, ulcers. No induration Neurologic: No facial asymmetry, moving extremity spontaneously, speech fluent.  Psychiatric: Normal judgment and insight. Alert and oriented x 3. Normal mood.   Labs on Admission: I have personally reviewed following labs and imaging studies  CBC: Recent Labs  Lab 01/13/24 1258  WBC 6.8  NEUTROABS 4.7  HGB 5.9*  HCT 18.0*  MCV 85.7  PLT 232   Basic Metabolic Panel: Recent Labs  Lab 01/13/24 1258  NA 138  K 3.3*  CL 97*  CO2 32  GLUCOSE 77  BUN 10  CREATININE 2.52*  CALCIUM 8.5*   GFR: Estimated Creatinine Clearance: 17 mL/min (A) (by C-G formula based on SCr of 2.52 mg/dL (H)). Liver Function Tests: Recent Labs  Lab 01/13/24 1258  AST 37  ALT 22  ALKPHOS 76  BILITOT <0.2  PROT 6.3*  ALBUMIN  3.9   Anemia Panel: Recent Labs    01/13/24 1258  VITAMINB12 1,207*  FOLATE >20.0  FERRITIN 549*  TIBC 224*  IRON 45  RETICCTPCT 3.1   Urine analysis:    Component Value Date/Time   COLORURINE YELLOW 02/09/2008 1437   APPEARANCEUR TURBID (A) 02/09/2008 1437   LABSPEC 1.025 02/09/2008 1437   PHURINE 6.0 02/09/2008 1437   GLUCOSEU NEGATIVE 02/09/2008 1437   HGBUR LARGE (A) 02/09/2008 1437   BILIRUBINUR NEGATIVE  02/09/2008 1437   KETONESUR 15 (A) 02/09/2008 1437   PROTEINUR >300 (A) 02/09/2008 1437   UROBILINOGEN 0.2 02/09/2008 1437   NITRITE NEGATIVE 02/09/2008 1437   LEUKOCYTESUR LARGE (A) 02/09/2008 1437    Radiological Exams on Admission: No results found.  EKG: None   Assessment/Plan Principal Problem:   Acute on chronic anemia Active Problems:   AVM (arteriovenous malformation) of colon with hemorrhage   ESRD on dialysis [facility])   Depression with anxiety   MENTAL RETARDATION, MODERATE   Essential (primary) hypertension   Lupus (systemic lupus erythematosus) (HCC)   Seizure disorder (HCC)   Diabetes (HCC)  Assessment and Plan:  Acute on chronic anemia, history of AVM-hemoglobin down to 5.9, hemoglobin 9.8 on recent discharge from hospital.  Denies melena hematochezia or hematemesis.  Denies NSAID use.  2 recent hospitalizations also for anemia.  Recent Colonoscopy 10/10, showed multiple AVMs, 1 actively bleeding.  Treated with coagulation.  Lesion in sigmoid colon oozing blood clip placed with hemostasis.  EGD 12/08/2023- gastritis.  - 1 unit transfused - CBC in a.m. - GI consult - IV Protonix  40 twice daily - N.p.o. midnight - Hold Plavix , patient with intellectual disabilities, unable to tell me indication for Plavix , I do not see recent documentation showing indication for Plavix . Per notes in epic- remote documentation suggest history of CVA.   I asked patient if Plavix  was for her legs, she states yes.  ESRD on dialysis-Monday Wednesday Friday.  Per child psychotherapist, patient completed dialysis today. - Nephrology to see in consult - Resume renal meds  Hypertension-stable. - Not on antihypertensives, resume midodrine  10 mg twice daily  Diabetes mellitus-diet controlled - Monitor CBGs daily  Mental retardation, seizure disorder-denies recent seizures. - Resume phenytoin   Lupus -Resume Plaquenil    DVT prophylaxis: SCDs Code Status: Full code Family Communication: None  at bedside Disposition Plan: ~  2 days Consults called: GI Admission status:  Obs tele   Author: Tully FORBES Carwin, MD 01/13/2024 5:30 PM  For on call review [email or web address].

## 2024-01-14 ENCOUNTER — Ambulatory Visit (INDEPENDENT_AMBULATORY_CARE_PROVIDER_SITE_OTHER): Admitting: Gastroenterology

## 2024-01-14 DIAGNOSIS — M3219 Other organ or system involvement in systemic lupus erythematosus: Secondary | ICD-10-CM

## 2024-01-14 DIAGNOSIS — N186 End stage renal disease: Secondary | ICD-10-CM | POA: Diagnosis not present

## 2024-01-14 DIAGNOSIS — D62 Acute posthemorrhagic anemia: Secondary | ICD-10-CM | POA: Diagnosis not present

## 2024-01-14 DIAGNOSIS — K5521 Angiodysplasia of colon with hemorrhage: Secondary | ICD-10-CM | POA: Diagnosis not present

## 2024-01-14 DIAGNOSIS — D649 Anemia, unspecified: Secondary | ICD-10-CM | POA: Diagnosis not present

## 2024-01-14 DIAGNOSIS — Z992 Dependence on renal dialysis: Secondary | ICD-10-CM | POA: Diagnosis not present

## 2024-01-14 LAB — CBC
HCT: 22 % — ABNORMAL LOW (ref 36.0–46.0)
Hemoglobin: 7.4 g/dL — ABNORMAL LOW (ref 12.0–15.0)
MCH: 28.1 pg (ref 26.0–34.0)
MCHC: 33.6 g/dL (ref 30.0–36.0)
MCV: 83.7 fL (ref 80.0–100.0)
Platelets: 225 K/uL (ref 150–400)
RBC: 2.63 MIL/uL — ABNORMAL LOW (ref 3.87–5.11)
RDW: 14.8 % (ref 11.5–15.5)
WBC: 6.9 K/uL (ref 4.0–10.5)
nRBC: 0 % (ref 0.0–0.2)

## 2024-01-14 LAB — TYPE AND SCREEN
ABO/RH(D): O POS
Antibody Screen: NEGATIVE
Unit division: 0

## 2024-01-14 LAB — BPAM RBC
Blood Product Expiration Date: 202511172359
ISSUE DATE / TIME: 202510291456
Unit Type and Rh: 5100

## 2024-01-14 LAB — HEPATITIS B SURFACE ANTIGEN: Hepatitis B Surface Ag: NONREACTIVE

## 2024-01-14 MED ORDER — ALPRAZOLAM 0.25 MG PO TABS
0.2500 mg | ORAL_TABLET | Freq: Two times a day (BID) | ORAL | Status: DC
  Administered 2024-01-14 – 2024-01-16 (×4): 0.25 mg via ORAL
  Filled 2024-01-14 (×5): qty 1

## 2024-01-14 MED ORDER — BISACODYL 5 MG PO TBEC
10.0000 mg | DELAYED_RELEASE_TABLET | Freq: Once | ORAL | Status: AC
  Administered 2024-01-14: 10 mg via ORAL
  Filled 2024-01-14: qty 2

## 2024-01-14 MED ORDER — POTASSIUM CHLORIDE 20 MEQ PO PACK
20.0000 meq | PACK | Freq: Every day | ORAL | Status: DC
  Filled 2024-01-14: qty 1

## 2024-01-14 MED ORDER — POLYETHYLENE GLYCOL 3350 17 GM/SCOOP PO POWD
238.0000 g | Freq: Once | ORAL | Status: AC
  Administered 2024-01-14: 238 g via ORAL

## 2024-01-14 MED ORDER — CALCITRIOL 0.25 MCG PO CAPS: 1.7500 ug | ORAL_CAPSULE | ORAL | Status: DC

## 2024-01-14 MED ORDER — SODIUM CHLORIDE 0.9 % IV SOLN: INTRAVENOUS | Status: AC

## 2024-01-14 MED ORDER — HYDROXYCHLOROQUINE SULFATE 200 MG PO TABS
200.0000 mg | ORAL_TABLET | Freq: Every day | ORAL | Status: DC
  Administered 2024-01-14 – 2024-01-16 (×2): 200 mg via ORAL
  Filled 2024-01-14 (×3): qty 1

## 2024-01-14 MED ORDER — POTASSIUM CHLORIDE CRYS ER 10 MEQ PO TBCR
10.0000 meq | EXTENDED_RELEASE_TABLET | Freq: Once | ORAL | Status: AC
  Administered 2024-01-14: 10 meq via ORAL
  Filled 2024-01-14: qty 1

## 2024-01-14 NOTE — Plan of Care (Signed)

## 2024-01-14 NOTE — Progress Notes (Signed)
 Patient non complaint drinking bowel prep for colonoscopy, needs constant reminding. Has drank 4 cups so far and had a clear liquid emesis.   Zofran  given.  [name] NP notified.  Will wait a half hour and proceed with colon prep.

## 2024-01-14 NOTE — Progress Notes (Signed)
 Attempted to call Dr Cinderella from GI to update on bowel prep not being able for patient to keep down.  Message left to return call.

## 2024-01-14 NOTE — Plan of Care (Signed)
  Problem: Education: Goal: Knowledge of General Education information will improve Description: Including pain rating scale, medication(s)/side effects and non-pharmacologic comfort measures Outcome: Progressing   Problem: Health Behavior/Discharge Planning: Goal: Ability to manage health-related needs will improve Outcome: Progressing   Problem: Clinical Measurements: Goal: Ability to maintain clinical measurements within normal limits will improve Outcome: Progressing   Problem: Pain Managment: Goal: General experience of comfort will improve and/or be controlled Outcome: Progressing   Problem: Safety: Goal: Ability to remain free from injury will improve Outcome: Progressing   Problem: Skin Integrity: Goal: Risk for impaired skin integrity will decrease Outcome: Progressing

## 2024-01-14 NOTE — Progress Notes (Signed)
 PROGRESS NOTE  [name] FMW:[id number] DOB: 1975/09/04 DOA: 01/13/2024 PCP: Gammon, Chrystal, NP  Brief History:  48 year old female with a history of blood loss anemia with colonic AVMs, ESRD (MWF), diabetes mellitus type 2, lupus, neurological disability, seizure disorder presenting with abnormal labs showing hemoglobin 5.5.  The patient was sent to the emergency department from the dialysis unit. The patient is a poor historian.  However she is able to answer basic questions.  She denies any fever, chills, chest pain, shortness breath, abdominal pain, vomiting, diarrhea, hematochezia, melena.  Recent hospitalization 10/8 to 10/11, she was sent to the ED with hemoglobin of 5.9.  She received 2 units of blood, hemoglobin was 9 on discharge.  Colonoscopy done showed multiple AVMs, 1 actively bleeding.  Treated with coagulation.  Lesion in sigmoid colon oozing blood clip placed with hemostasis.   Was also hospitalized 9/22- 9/25, hemoglobin was down to 7, had endoscopy which showed gastritis, hemoglobin improved and she was discharged.  Colonoscopy 12/25/2023 --Nonbleeding internal hemorrhoids --Single recently bleeding colonic angiodysplastic lesion treated with APC --4 nonbleeding colonic angiodysplastic lesions treated with APC --1 single bleeding angiodysplastic lesion--Hemoclip placed --Instructions to resume Plavix  in 2 days after colonoscopy  12/08/2023 EGD --Normal esophagus, gastritis, normal duodenum   In the ED, the patient was afebrile and hemodynamically stable with oxygen saturation 99% room air.  WBC 6.8, hemoglobin 5.9, platelets 225.  1 unit PRBC was transfused.   Assessment/Plan: Acute blood loss anemia -Hemoglobin down to 5.9 - Hemoglobin 9.8 at the time of last hospital discharge - Recent colonoscopy and EGD as discussed above - 1 unit PRBC ordered - Continue pantoprazole  twice daily - GI consult - Holding Plavix   ESRD - Nephrology  consulted for maintenance dialysis - She dialyzes Monday, Wednesday, Friday - Patient received dialysis 01/13/2024  Diet-controlled diabetes mellitus type 2 - Monitor CBGs daily  Intellectual disability/seizure disorder - Resume phenytoin   Systemic lupus - Resume Plaquenil   Anxiety/depression - Resume sertraline  and Xanax  - PDMP reviewed - Xanax  0.25 mg, #60, last refill 12/18/2023        Family Communication:  no Family at bedside  Consultants:  renal, GI  Code Status:  FULL   DVT Prophylaxis:  SCD   Procedures: As Listed in Progress Note Above  Antibiotics: None     Subjective: Patient denies fevers, chills, headache, chest pain, dyspnea, nausea, vomiting, diarrhea, abdominal pain, dysuria, hematuria, hematochezia, and melena.   Objective: Vitals:   01/13/24 2214 01/14/24 0203 01/14/24 0604 01/14/24 0805  BP: (!) 115/58 124/71 122/65 122/65  Pulse: 88 88 87 84  Resp: 16 17 18 18   Temp: 98.3 F (36.8 C) 97.7 F (36.5 C) 97.8 F (36.6 C) 97.7 F (36.5 C)  TempSrc: Oral Oral Oral Oral  SpO2: 100% 99% 99% 100%  Weight:      Height:        Intake/Output Summary (Last 24 hours) at 01/14/2024 9167 Last data filed at 01/13/2024 2247 Gross per 24 hour  Intake 590 ml  Output --  Net 590 ml   Weight change:  Exam:  General:  Pt is alert, follows commands appropriately, not in acute distress HEENT: No icterus, No thrush, No neck mass, [place]/AT Cardiovascular: RRR, S1/S2, no rubs, no gallops Respiratory: CTA bilaterally, no wheezing, no crackles, no rhonchi Abdomen: Soft/+BS, non tender, non distended, no guarding Extremities: No edema, No lymphangitis, No petechiae, No rashes, no synovitis   Data Reviewed: I  have personally reviewed following labs and imaging studies Basic Metabolic Panel: Recent Labs  Lab 01/13/24 1258  NA 138  K 3.3*  CL 97*  CO2 32  GLUCOSE 77  BUN 10  CREATININE 2.52*  CALCIUM 8.5*   Liver Function Tests: Recent  Labs  Lab 01/13/24 1258  AST 37  ALT 22  ALKPHOS 76  BILITOT <0.2  PROT 6.3*  ALBUMIN  3.9   No results for input(s): LIPASE, AMYLASE in the last 168 hours. No results for input(s): AMMONIA in the last 168 hours. Coagulation Profile: No results for input(s): INR, PROTIME in the last 168 hours. CBC: Recent Labs  Lab 01/13/24 1258 01/13/24 2002 01/14/24 0438  WBC 6.8  --  6.9  NEUTROABS 4.7  --   --   HGB 5.9* 7.8* 7.4*  HCT 18.0* 23.2* 22.0*  MCV 85.7  --  83.7  PLT 232  --  225   Cardiac Enzymes: No results for input(s): CKTOTAL, CKMB, CKMBINDEX, TROPONINI in the last 168 hours. BNP: Invalid input(s): POCBNP CBG: No results for input(s): GLUCAP in the last 168 hours. HbA1C: No results for input(s): HGBA1C in the last 72 hours. Urine analysis:    Component Value Date/Time   COLORURINE YELLOW 02/09/2008 1437   APPEARANCEUR TURBID (A) 02/09/2008 1437   LABSPEC 1.025 02/09/2008 1437   PHURINE 6.0 02/09/2008 1437   GLUCOSEU NEGATIVE 02/09/2008 1437   HGBUR LARGE (A) 02/09/2008 1437   BILIRUBINUR NEGATIVE 02/09/2008 1437   KETONESUR 15 (A) 02/09/2008 1437   PROTEINUR >300 (A) 02/09/2008 1437   UROBILINOGEN 0.2 02/09/2008 1437   NITRITE NEGATIVE 02/09/2008 1437   LEUKOCYTESUR LARGE (A) 02/09/2008 1437   Sepsis Labs: @LABRCNTIP (procalcitonin:4,lacticidven:4) )No results found for this or any previous visit (from the past 240 hours).   Scheduled Meds:  [START ON 01/15/2024] calcitRIOL   1.75 mcg Oral Q M,W,F-HD   Chlorhexidine  Gluconate Cloth  6 each Topical Q0600   midodrine   10 mg Oral BID WC   multivitamin  1 tablet Oral Daily   pantoprazole  (PROTONIX ) IV  40 mg Intravenous Q12H   phenytoin   100 mg Oral BID   potassium chloride   20 mEq Oral Daily   Continuous Infusions:  Procedures/Studies: DG Chest Port 1 View Result Date: 12/23/2023 EXAM: 1 VIEW(S) XRAY OF THE CHEST 12/23/2023 10:40:00 AM COMPARISON: 12/10/2023 CLINICAL HISTORY:  weakness. Pt BIB [facility] from dialysis for c/o low hemoglobin; dialysis states pt's hemoglobin is 5.9; pt was seen here last week for same complaint and received blood; pt is unsure how many; ; Pt denies any sob or pain. Hx of DVT, hypertension. Current everyday smoker FINDINGS: LUNGS AND PLEURA: Interval increase in right pleural effusion which is now moderate in volume compared to small. No focal pulmonary opacity. No pulmonary edema. No pneumothorax. HEART AND MEDIASTINUM: Vascular stents noted. No acute abnormality of the cardiac and mediastinal silhouettes. BONES AND SOFT TISSUES: No acute osseous abnormality. IMPRESSION: 1. Interval increase in right pleural effusion, now moderate in volume compared to small. 2. No focal pulmonary opacity, pulmonary edema, or pneumothorax. Electronically signed by: Norleen Boxer MD 12/23/2023 11:12 AM EDT RP Workstation: KIERAN Alm Schneider, DO  [facility]  If 7PM-7AM, please contact night-coverage [email or web address] Password [email or web address] 01/14/2024, 8:32 AM   LOS: 0 days

## 2024-01-14 NOTE — Care Management Obs Status (Signed)
 MEDICARE OBSERVATION STATUS NOTIFICATION   Patient Details  Name: [name] MRN: [id number] Date of Birth: November 28, 1975   Medicare Observation Status Notification Given:  Yes    Jolynda Townley L Cosette Prindle 01/14/2024, 4:21 PM

## 2024-01-14 NOTE — Hospital Course (Addendum)
 48 year old female with a history of blood loss anemia with colonic AVMs, ESRD (MWF), diabetes mellitus type 2, lupus, neurological disability, seizure disorder presenting with abnormal labs showing hemoglobin 5.5.  The patient was sent to the emergency department from the dialysis unit. The patient is a poor historian.  However she is able to answer basic questions.  She denies any fever, chills, chest pain, shortness breath, abdominal pain, vomiting, diarrhea, hematochezia, melena.  Recent hospitalization 10/8 to 10/11, she was sent to the ED with hemoglobin of 5.9.  She received 2 units of blood, hemoglobin was 9 on discharge.  Colonoscopy done showed multiple AVMs, 1 actively bleeding.  Treated with coagulation.  Lesion in sigmoid colon oozing blood clip placed with hemostasis.   Was also hospitalized 9/22- 9/25, hemoglobin was down to 7, had endoscopy which showed gastritis, hemoglobin improved and she was discharged.  Colonoscopy 12/25/2023 --Nonbleeding internal hemorrhoids --Single recently bleeding colonic angiodysplastic lesion treated with APC --4 nonbleeding colonic angiodysplastic lesions treated with APC --1 single bleeding angiodysplastic lesion--Hemoclip placed --Instructions to resume Plavix  in 2 days after colonoscopy  12/08/2023 EGD --Normal esophagus, gastritis, normal duodenum   In the ED, the patient was afebrile and hemodynamically stable with oxygen saturation 99% room air.  WBC 6.8, hemoglobin 5.9, platelets 225.  1 unit PRBC was transfused.

## 2024-01-14 NOTE — Consult Note (Signed)
 [facility]  INPATIENT CONSULTATION  Reason for Consultation: comanagement ESRD and assoc conditions Requesting Provider: Dr. Evonnie  HPI: [name] is an 48 y.o. female with ESRD on HD MWF, h/o DVT, seizures, AVMs, DM, developmental delay, SLE currently admitted for ABLA secondary to GIB who nephrology will follow for dialysis and comanagement of assoc conditions.   Presented yesterday in setting of anemia noted on routine labs at HD.  She completed a full HD Wed but labs returned from 10/27 showing Hb 5.5 so she was transported to [facility] after dialysis.  Found to have Hb in 5s and rec'd 1u pRBC.  GI consulted.  She's been hemodynamically stable.  Overnight Hb 7.8 this AM 7.4.   She had a similar admissions earlier this month as well as sept 2025.   She is alone in room this AM.  Only c/o being cold and the room is chilly.    PMH: Past Medical History:  Diagnosis Date   Anemia    BV (bacterial vaginosis) 11/08/2013   Depression    Dialysis patient    DVT (deep venous thrombosis) (HCC)    Hypertension    Lupus    Renal disorder    Vaginal odor 11/08/2013   PSH: Past Surgical History:  Procedure Laterality Date   A/V SHUNT INTERVENTION N/A 10/24/2016   Procedure: A/V SHUNT INTERVENTION;  Surgeon: Jama Cordella MATSU, MD;  Location: [facility];  Service: Cardiovascular;  Laterality: N/A;   A/V SHUNT INTERVENTION Left 09/15/2023   Procedure: A/V SHUNT INTERVENTION;  Surgeon: Lanis Fonda BRAVO, MD;  Location: [facility];  Service: Cardiovascular;  Laterality: Left;   A/V SHUNTOGRAM Left 12/08/2017   Procedure: A/V SHUNTOGRAM;  Surgeon: Jama Cordella MATSU, MD;  Location: [facility];  Service: Cardiovascular;  Laterality: Left;   A/V SHUNTOGRAM Left 01/28/2018   Procedure: A/V SHUNTOGRAM;  Surgeon: [name], MD;  Location: [facility];  Service: Cardiovascular;  Laterality: Left;   A/V SHUNTOGRAM Left 04/01/2018   Procedure: A/V  SHUNTOGRAM;  Surgeon: [name], MD;  Location: [facility];  Service: Cardiovascular;  Laterality: Left;   A/V SHUNTOGRAM Left 04/28/2019   Procedure: A/V SHUNTOGRAM;  Surgeon: [name], MD;  Location: [facility];  Service: Cardiovascular;  Laterality: Left;   AV FISTULA PLACEMENT     AV FISTULA PLACEMENT Left 02/09/2022   Procedure: FISTULAGRAM, placement of left arm stent.;  Surgeon: [name], MD;  Location: [facility];  Service: Vascular;  Laterality: Left;   AV FISTULA PLACEMENT Right 09/02/2022   Procedure: EXPLORATION OF RIGHT AXILLA, INADEQUATE VEIN ACCESS;  Surgeon: Oris Krystal FALCON, MD;  Location: [facility];  Service: Vascular;  Laterality: Right;   AV FISTULA PLACEMENT Left 09/25/2022   Procedure: INSERTION OF LEFT FEMORAL LOOP GRAFT USING 4-7mm GORETEX STRETCH GRAFT;  Surgeon: [name], MD;  [facility]: [facility];  Service: Vascular;  Laterality: Left;   COLONOSCOPY N/A 12/25/2023   Procedure: COLONOSCOPY;  Surgeon: Cindie Carlin POUR, DO;  [facility];  Service: Endoscopy;  Laterality: N/A;   DIALYSIS/PERMA CATHETER INSERTION  10/23/2016   Procedure: DIALYSIS/PERMA CATHETER INSERTION;  Surgeon: [name], MD;  Location: [facility];  Service: Cardiovascular;;   ESOPHAGOGASTRODUODENOSCOPY N/A 12/08/2023   Procedure: EGD (ESOPHAGOGASTRODUODENOSCOPY);  Surgeon: Eartha Flavors, Toribio, MD;  [facility];  Service: Gastroenterology;  Laterality: N/A;   IR AV DIALY SHUNT INTRO NEEDLE/INTRACATH INITIAL W/PTA/IMG  LEFT  05/12/2023   IR REMOVAL TUN CV CATH W/O FL  09/22/2017   IR REMOVAL TUN CV CATH W/O FL  01/06/2023   IR THROMBECTOMY AV FISTULA W/THROMBOLYSIS INC/SHUNT/IMG LEFT Left 09/04/2017   IR US  GUIDE VASC ACCESS LEFT  09/04/2017   IR US  GUIDE VASC ACCESS LEFT  05/12/2023   MASS EXCISION Left 06/01/2018   Procedure: EXCISION 5 X 3CM LIPOMA LEFT THIGH;  Surgeon: [name], MD;  Location: [facility];  Service: General;  Laterality:  Left;   PERIPHERAL VASCULAR CATHETERIZATION Left 01/04/2015   Procedure: A/V Shuntogram/Fistulagram;  Surgeon: Selinda GORMAN Gu, MD;  Location: [facility];  Service: Cardiovascular;  Laterality: Left;   PERIPHERAL VASCULAR CATHETERIZATION N/A 01/04/2015   Procedure: A/V Shunt Intervention;  Surgeon: Selinda GORMAN Gu, MD;  Location: [facility];  Service: Cardiovascular;  Laterality: N/A;   PERIPHERAL VASCULAR CATHETERIZATION Left 04/13/2015   Procedure: A/V Shuntogram/Fistulagram;  Surgeon: [name], MD;  Location: [facility];  Service: Cardiovascular;  Laterality: Left;   PERIPHERAL VASCULAR CATHETERIZATION Left 04/13/2015   Procedure: A/V Shunt Intervention;  Surgeon: [name], MD;  Location: [facility];  Service: Cardiovascular;  Laterality: Left;   REMOVAL OF A DIALYSIS CATHETER Left 09/25/2022   Procedure: REMOVAL OF LEFT INTERNAL JUGULAR HERO CATHETER;  Surgeon: [name], MD;  Location: [facility];  Service: Vascular;  Laterality: Left;   UPPER EXTREMITY VENOGRAPHY N/A 08/26/2022   Procedure: UPPER EXTREMITY VENOGRAPHY;  Surgeon: [name], MD;  [facility];  Service: Cardiovascular;  Laterality: N/A;   VENOUS ANGIOPLASTY  09/15/2023   Procedure: VENOUS ANGIOPLASTY;  Surgeon: Lanis Fonda BRAVO, MD;  Location: [facility];  Service: Cardiovascular;;  leg loop graft    Past Medical History:  Diagnosis Date   Anemia    BV (bacterial vaginosis) 11/08/2013   Depression    Dialysis patient    DVT (deep venous thrombosis) (HCC)    Hypertension    Lupus    Renal disorder    Vaginal odor 11/08/2013    Medications:  I have reviewed the patient's current medications.  Medications Prior to Admission  Medication Sig Dispense Refill   albuterol  (PROVENTIL ) (2.5 MG/3ML) 0.083% nebulizer solution Take 2.5 mg by nebulization 4 (four) times daily as needed for wheezing or shortness of breath.      ALPRAZolam  (XANAX ) 0.25 MG  tablet Take 0.25 mg by mouth 2 (two) times daily.     ammonium lactate  (AMLACTIN DAILY) 12 % lotion Apply 1 Application topically as needed for dry skin. 400 g 0   aspirin EC 81 MG tablet Take 81 mg by mouth daily. Swallow whole.     Calcium Carbonate Antacid (TUMS PO) Take 1,000 mg by mouth 2 (two) times daily between meals.     chlorhexidine  (PERIDEX ) 0.12 % solution Use as directed 15 mLs in the mouth or throat 2 (two) times daily. After regular toothbrushing, rinse mouth     clopidogrel  (PLAVIX ) 75 MG tablet Take 1 tablet (75 mg total) by mouth daily.     cycloSPORINE (RESTASIS) 0.05 % ophthalmic emulsion Place 1 drop into both eyes 2 (two) times daily.     docusate sodium  (COLACE) 100 MG capsule Take 100 mg by mouth daily.     epoetin  alfa (EPOGEN ,PROCRIT) 4000 UNIT/ML injection Inject 4,000 Units into the vein See admin instructions. Mon, Wed, and Friday at dialysis     guaiFENesin  (ROBITUSSIN) 100 MG/5ML  liquid Take 200 mg by mouth every 6 (six) hours as needed for cough.      hydroxychloroquine  (PLAQUENIL ) 200 MG tablet Take 200 mg by mouth daily.     ipratropium (ATROVENT ) 0.02 % nebulizer solution Take 0.5 mg by nebulization 4 (four) times daily as needed for wheezing or shortness of breath (For Breathing).     lidocaine -prilocaine  (EMLA ) cream Apply 1 application topically as needed (port access).      loratadine (CLARITIN) 10 MG tablet Take 10 mg by mouth daily as needed for allergies.     midodrine  (PROAMATINE ) 10 MG tablet Take 10 mg by mouth 2 (two) times daily.      mirtazapine  (REMERON ) 15 MG tablet Take 7.5 mg by mouth at bedtime.      multivitamin (RENA-VIT) TABS tablet Take 1 tablet by mouth daily.     pantoprazole  (PROTONIX ) 40 MG tablet Take 40 mg by mouth daily.     phenytoin  (DILANTIN ) 100 MG ER capsule Take 100 mg by mouth 2 (two) times daily.      sertraline  (ZOLOFT ) 100 MG tablet Take 200 mg by mouth daily.      simvastatin  (ZOCOR ) 40 MG tablet Take 40 mg by mouth at  bedtime.      ALLERGIES:   Allergies  Allergen Reactions   Dust Mite Extract Other (See Comments)    sneezing    FAM HX: Family History  Problem Relation Age of Onset   Seizures Son    Kidney disease Mother        on dialysis   Lupus Mother     Social History:   reports that she has been smoking cigarettes. She has a 1.5 pack-year smoking history. She has been exposed to tobacco smoke. She has never used smokeless tobacco. She reports that she does not drink alcohol  and does not use drugs.  ROS: 12 system ROS neg except per HPI above  Blood pressure 122/65, pulse 84, temperature 97.7 F (36.5 C), temperature source Oral, resp. rate 18, height 5' 2 (1.575 m), weight 39.8 kg, SpO2 100%. PHYSICAL EXAM: General appearance: no distress and awake and alert Head: Normocephalic, without obvious abnormality, atraumatic Resp: clear to auscultation bilaterally Cardio: regular rate and rhythm, S1, S2 normal, no murmur, click, rub or gallop GI: soft, non-tender; bowel sounds normal; no masses,  no organomegaly Extremities: extremities normal, atraumatic, no cyanosis or edema and left thigh AVG +T/B   Results for orders placed or performed during the hospital encounter of 01/13/24 (from the past 48 hours)  CBC with Differential     Status: Abnormal   Collection Time: 01/13/24 12:58 PM  Result Value Ref Range   WBC 6.8 4.0 - 10.5 K/uL   RBC 2.10 (L) 3.87 - 5.11 MIL/uL   Hemoglobin 5.9 (LL) 12.0 - 15.0 g/dL    Comment: REPEATED TO VERIFY This critical result has been called to T DEVOLT RN by Karl Carolin on 01/13/2024 13:54:16, and has been read back.    HCT 18.0 (L) 36.0 - 46.0 %   MCV 85.7 80.0 - 100.0 fL   MCH 28.1 26.0 - 34.0 pg   MCHC 32.8 30.0 - 36.0 g/dL   RDW 84.0 (H) 88.4 - 84.4 %   Platelets 232 150 - 400 K/uL   nRBC 0.0 0.0 - 0.2 %   Neutrophils Relative % 69 %   Neutro Abs 4.7 1.7 - 7.7 K/uL   Lymphocytes Relative 20 %   Lymphs Abs 1.3 0.7 - 4.0  K/uL    Monocytes Relative 8 %   Monocytes Absolute 0.6 0.1 - 1.0 K/uL   Eosinophils Relative 2 %   Eosinophils Absolute 0.1 0.0 - 0.5 K/uL   Basophils Relative 1 %   Basophils Absolute 0.1 0.0 - 0.1 K/uL   Immature Granulocytes 0 %   Abs Immature Granulocytes 0.01 0.00 - 0.07 K/uL    Comment: Performed at [facility], [address] [place], [place] [address]  Comprehensive metabolic panel     Status: Abnormal   Collection Time: 01/13/24 12:58 PM  Result Value Ref Range   Sodium 138 135 - 145 mmol/L   Potassium 3.3 (L) 3.5 - 5.1 mmol/L   Chloride 97 (L) 98 - 111 mmol/L   CO2 32 22 - 32 mmol/L   Glucose, Bld 77 70 - 99 mg/dL    Comment: Glucose reference range applies only to samples taken after fasting for at least 8 hours.   BUN 10 6 - 20 mg/dL   Creatinine, Ser 7.47 (H) 0.44 - 1.00 mg/dL   Calcium 8.5 (L) 8.9 - 10.3 mg/dL   Total Protein 6.3 (L) 6.5 - 8.1 g/dL   Albumin  3.9 3.5 - 5.0 g/dL   AST 37 15 - 41 U/L   ALT 22 0 - 44 U/L   Alkaline Phosphatase 76 38 - 126 U/L   Total Bilirubin <0.2 0.0 - 1.2 mg/dL   GFR, Estimated 23 (L) >60 mL/min    Comment: (NOTE) Calculated using the CKD-EPI Creatinine Equation (2021)    Anion gap 9 5 - 15    Comment: Performed at [facility], [address] [place], [place] [address]  Vitamin B12     Status: Abnormal   Collection Time: 01/13/24 12:58 PM  Result Value Ref Range   Vitamin B-12 1,207 (H) 180 - 914 pg/mL    Comment: Performed at [facility], [address] [place], [place] [address]  Folate     Status: None   Collection Time: 01/13/24 12:58 PM  Result Value Ref Range   Folate >20.0 >5.9 ng/mL    Comment: Performed at [facility], [address], [place], [place] [address]  Iron and TIBC     Status: Abnormal   Collection Time: 01/13/24 12:58 PM  Result Value Ref Range   Iron 45 28 - 170 ug/dL   TIBC 775 (L) 749 - 549 ug/dL   Saturation Ratios 20 10.4 - 31.8 %   UIBC 179 ug/dL    Comment: Performed at [facility], [address] [place], [place] [address]  Ferritin     Status: Abnormal   Collection Time: 01/13/24 12:58 PM  Result Value Ref Range   Ferritin 549 (H) 11 - 307 ng/mL    Comment: Performed at [facility], [address] [place], [place] [address]  Reticulocytes     Status: Abnormal   Collection Time: 01/13/24 12:58 PM  Result Value Ref Range   Retic Ct Pct 3.1 0.4 - 3.1 %   RBC. 2.07 (L) 3.87 - 5.11 MIL/uL   Retic Count, Absolute 64.8 19.0 - 186.0 K/uL   Immature Retic Fract 25.8 (H) 2.3 - 15.9 %    Comment: Performed at [facility], [address] [place], [place] [address]  Type and screen [facility]     Status: None   Collection Time: 01/13/24 12:58 PM  Result Value Ref Range   ABO/RH(D) O POS    Antibody Screen NEG    Sample  Expiration 01/16/2024,2359    Unit Number [id number]    Blood Component Type RBC LR PHER1    Unit division 00    Status of Unit ISSUED,FINAL    Transfusion Status OK TO TRANSFUSE    Crossmatch Result      Compatible Performed at [facility], [address] [place], [place] [address]   Prepare RBC (crossmatch)     Status: None   Collection Time: 01/13/24  2:20 PM  Result Value Ref Range   Order Confirmation      ORDER PROCESSED BY BLOOD BANK Performed at [facility], [address], [place], [place] [address]   Hemoglobin and hematocrit, blood     Status: Abnormal   Collection Time: 01/13/24  8:02 PM  Result Value Ref Range   Hemoglobin 7.8 (L) 12.0 - 15.0 g/dL    Comment: REPEATED TO VERIFY POST TRANSFUSION SPECIMEN    HCT 23.2 (L) 36.0 - 46.0 %    Comment: Performed at [facility], [address], [place], [place] [address]  CBC     Status: Abnormal   Collection Time: 01/14/24  4:38 AM  Result Value Ref Range   WBC 6.9 4.0 - 10.5 K/uL   RBC 2.63 (L) 3.87 - 5.11 MIL/uL   Hemoglobin 7.4 (L) 12.0 - 15.0 g/dL   HCT 77.9 (L) 63.9 - 53.9 %   MCV 83.7 80.0 - 100.0 fL   MCH 28.1 26.0 - 34.0 pg   MCHC 33.6 30.0 - 36.0 g/dL   RDW 85.1 88.4 -  84.4 %   Platelets 225 150 - 400 K/uL   nRBC 0.0 0.0 - 0.2 %    Comment: Performed at [facility], [address], [place], [place] [address]    No results found.  HD orders: Larue Chester MWF Last tx 9/22 3:30h, EDW 53kg, AVG L thigh 15g, 17H elisio BFR 400, DFR 500, 2/2.5 dialysate RN reports commonly has oozing around needle site Heparin  2500 loading + 1000 hourly Calictriol 1.75 three times per week    Assessment/Plan Marcelyn Ruppe is an 48 y.o. female with ESRD on HD, HTN, SLE, DVT, seizure, mod intellectual disabilities, depression currently admitted for GIB and nephrology is consulted for co management of ESRD and associated conditions.    Assessment/Plan:  ABLA + h/o GIB + anemia of ESRD: Hb 5s s/p pRBC now with Hb in 7s.   PPI. GI consult pending - recurrent issues in the past few months.   ESA given outpt HD, not due yet.  Hold heparin  with HD.  ESRD -  She did complete 3 hours of her HD session yesterday and lytes/volume ok today.  Will plan for HD here tomorrow if remains admitted.   Hypertension/volume  - No evidence of edema or volume overload.  UF as bp tolerates.  Continue with midodrine  10 mg bid with HD   Metabolic bone disease -  Phos controlled w/o binders.  Corr ca ok.  On calcitriol  with HD.   Nutrition - renal diet  Hypokalemia:  will supplement today and check in AM to allow proper dialysate use.   [name] 01/14/2024, 8:06 AM

## 2024-01-14 NOTE — Progress Notes (Signed)
 Patients legal guardian was contacted to obtain consent for procedure of colonoscopy tomorrow consent was signed with a second verify from Rayfield Ned RN and is in patients paper chart

## 2024-01-14 NOTE — H&P (View-Only) (Signed)
 Gastroenterology Consult   Referring Provider: No ref. provider found Primary Care Physician:  Gammon, Chrystal, NP Primary Gastroenterologist:  Toribio Fortune, MD Patient ID: [name]; [id number]; 11-01-1975   Admit date: 01/13/2024  LOS: 0 days   Date of Consultation: 01/14/2024  Reason for Consultation:  recurrent anemia, likely due to gi bleed with history of colon AVMS. On plavix  for unknown indication.    History of Present Illness   [name] is a 48 y.o. female with history of lupus, HTN, seizure disorder, intellectual disabilities, DVT, ESRD on HD, anemia, depression with recent admissions for anemia found to have multiple colonic AVMs and ?Dieulafoy vs small ulcer in the sigmoid colon on colonoscopy 12/25/23, presenting by EMS from [facility] due to Hgb of 5.5.   ED course: Patient refused rectal exam. Hgb 5.9 (down from 9.8 on 12/26/23) K 3.3 B12 1207, Folate >20, iron 45, TIBC 224, iron sat 20, ferritin 549 Hgb up to 7.8 post one unit prbcs Hgb 7.4 this AM  GI consult: Patient with moderate intellectual disabilities.  She has a legal guardian with [facility] Department of Social Services. Seen September 23 during her first hospitalization with hemoglobin of 7, stools reported to be dark, Hemoccult positive.  Chronically on Plavix .  EGD with gastritis.  Received 1 unit of packed red blood cells.  Presented back on October 8 with hemoglobin of 5.3 (down from 8.4 on 9/25).  Completed colonoscopy, 5 nonbleeding colonic angiodysplastic lesions treated with APC.  Single bleeding lesion noted in the sigmoid colon possible Dieulafoy versus small ulcer status post clip.  Plavix  restarted 2 days later.  Patient denies any black or bloody stools.  Denies blood in the urine, vaginal bleeding, nosebleeds.  Denies abdominal pain.  No heartburn or dysphagia.  No vomiting.  Denies lightheadedness, dizziness.  EGD 12/08/23: -normal  esophagus -gastritis -normal examined duodenum  Colonoscopy 12/25/23: -non-bleeding internal hemorrhoids -single recently bleeding colonic angiodysplastic lesion treated with APC -four non-bleeding colonic angiodysplastic lesions treated with APC -single bleeding lesion in sigmoid colon ?Dieulafoy vs small ulcer s/p clip  Prior to Admission medications   Medication Sig Start Date End Date Taking? Authorizing Provider  albuterol  (PROVENTIL ) (2.5 MG/3ML) 0.083% nebulizer solution Take 2.5 mg by nebulization 4 (four) times daily as needed for wheezing or shortness of breath.     [provider]  ALPRAZolam  (XANAX ) 0.25 MG tablet Take 0.25 mg by mouth 2 (two) times daily.    [provider]  ammonium lactate  (AMLACTIN DAILY) 12 % lotion Apply 1 Application topically as needed for dry skin. 04/23/23   [name], DPM  aspirin EC 81 MG tablet Take 81 mg by mouth daily. Swallow whole.    [provider]  Calcium Carbonate Antacid (TUMS PO) Take 1,000 mg by mouth 2 (two) times daily between meals. 06/13/22   [provider]  chlorhexidine  (PERIDEX ) 0.12 % solution Use as directed 15 mLs in the mouth or throat 2 (two) times daily. After regular toothbrushing, rinse mouth 08/20/22   [provider]  clopidogrel  (PLAVIX ) 75 MG tablet Take 1 tablet (75 mg total) by mouth daily. 12/28/23   Darci Pore, MD  cycloSPORINE (RESTASIS) 0.05 % ophthalmic emulsion Place 1 drop into both eyes 2 (two) times daily. 01/31/21   [provider]  docusate sodium  (COLACE) 100 MG capsule Take 100 mg by mouth daily.    [provider]  epoetin  alfa (EPOGEN ,PROCRIT) 4000 UNIT/ML injection Inject 4,000 Units  into the vein See admin instructions. Mon, Wed, and Friday at dialysis    [provider]  guaiFENesin  (ROBITUSSIN) 100 MG/5ML liquid Take 200 mg by mouth every 6 (six) hours as needed for cough.     [provider]   hydroxychloroquine  (PLAQUENIL ) 200 MG tablet Take 200 mg by mouth daily.    [provider]  ipratropium (ATROVENT ) 0.02 % nebulizer solution Take 0.5 mg by nebulization 4 (four) times daily as needed for wheezing or shortness of breath (For Breathing).    [provider]  lidocaine -prilocaine  (EMLA ) cream Apply 1 application topically as needed (port access).  12/20/12   [provider]  loratadine (CLARITIN) 10 MG tablet Take 10 mg by mouth daily as needed for allergies.    [provider]  midodrine  (PROAMATINE ) 10 MG tablet Take 10 mg by mouth 2 (two) times daily.     [provider]  mirtazapine  (REMERON ) 15 MG tablet Take 7.5 mg by mouth at bedtime.  11/03/13   [provider]  multivitamin (RENA-VIT) TABS tablet Take 1 tablet by mouth daily.    [provider]  pantoprazole  (PROTONIX ) 40 MG tablet Take 40 mg by mouth daily.    [provider]  phenytoin  (DILANTIN ) 100 MG ER capsule Take 100 mg by mouth 2 (two) times daily.     [provider]  sertraline  (ZOLOFT ) 100 MG tablet Take 200 mg by mouth daily.     [provider]  simvastatin  (ZOCOR ) 40 MG tablet Take 40 mg by mouth at bedtime.    [provider]    Current Facility-Administered Medications  Medication Dose Route Frequency Provider Last Rate Last Admin   acetaminophen  (TYLENOL ) tablet 650 mg  650 mg Oral Q6H PRN Emokpae, Ejiroghene E, MD       Or   acetaminophen  (TYLENOL ) suppository 650 mg  650 mg Rectal Q6H PRN Emokpae, Ejiroghene E, MD       [START ON 01/15/2024] calcitRIOL  (ROCALTROL ) capsule 1.75 mcg  1.75 mcg Oral Q M,W,F-HD Norine Manuelita LABOR, MD       Chlorhexidine  Gluconate Cloth 2 % PADS 6 each  6 each Topical Q0600 Rayburn Pac, MD   6 each at 01/14/24 0457   midodrine  (PROAMATINE ) tablet 10 mg  10 mg Oral BID WC Emokpae, Ejiroghene E, MD   10 mg at 01/14/24 0809   multivitamin (RENA-VIT) tablet 1 tablet  1 tablet  Oral Daily Emokpae, Ejiroghene E, MD       ondansetron  (ZOFRAN ) tablet 4 mg  4 mg Oral Q6H PRN Emokpae, Ejiroghene E, MD       Or   ondansetron  (ZOFRAN ) injection 4 mg  4 mg Intravenous Q6H PRN Emokpae, Ejiroghene E, MD       pantoprazole  (PROTONIX ) injection 40 mg  40 mg Intravenous Q12H Emokpae, Ejiroghene E, MD   40 mg at 01/14/24 0802   phenytoin  (DILANTIN ) ER capsule 100 mg  100 mg Oral BID Emokpae, Ejiroghene E, MD   100 mg at 01/13/24 2103   polyethylene glycol (MIRALAX  / GLYCOLAX ) packet 17 g  17 g Oral Daily PRN Emokpae, Ejiroghene E, MD       potassium chloride  (KLOR-CON ) packet 20 mEq  20 mEq Oral Daily Norine Manuelita LABOR, MD        Allergies as of 01/13/2024 - Review Complete 01/13/2024  Allergen Reaction Noted   Dust mite extract Other (See Comments) 11/08/2013    Past Medical History:  Diagnosis Date  Anemia    BV (bacterial vaginosis) 11/08/2013   Depression    Dialysis patient    DVT (deep venous thrombosis) (HCC)    Hypertension    Lupus    Renal disorder    Vaginal odor 11/08/2013    Past Surgical History:  Procedure Laterality Date   A/V SHUNT INTERVENTION N/A 10/24/2016   Procedure: A/V SHUNT INTERVENTION;  Surgeon: Jama Cordella MATSU, MD;  Location: [facility];  Service: Cardiovascular;  Laterality: N/A;   A/V SHUNT INTERVENTION Left 09/15/2023   Procedure: A/V SHUNT INTERVENTION;  Surgeon: Lanis Fonda BRAVO, MD;  Location: [facility];  Service: Cardiovascular;  Laterality: Left;   A/V SHUNTOGRAM Left 12/08/2017   Procedure: A/V SHUNTOGRAM;  Surgeon: Jama Cordella MATSU, MD;  Location: [facility];  Service: Cardiovascular;  Laterality: Left;   A/V SHUNTOGRAM Left 01/28/2018   Procedure: A/V SHUNTOGRAM;  Surgeon: [name], MD;  Location: [facility];  Service: Cardiovascular;  Laterality: Left;   A/V SHUNTOGRAM Left 04/01/2018   Procedure: A/V SHUNTOGRAM;  Surgeon: [name], MD;  Location: [facility];  Service:  Cardiovascular;  Laterality: Left;   A/V SHUNTOGRAM Left 04/28/2019   Procedure: A/V SHUNTOGRAM;  Surgeon: [name], MD;  Location: [facility];  Service: Cardiovascular;  Laterality: Left;   AV FISTULA PLACEMENT     AV FISTULA PLACEMENT Left 02/09/2022   Procedure: FISTULAGRAM, placement of left arm stent.;  Surgeon: [name], MD;  Location: [facility];  Service: Vascular;  Laterality: Left;   AV FISTULA PLACEMENT Right 09/02/2022   Procedure: EXPLORATION OF RIGHT AXILLA, INADEQUATE VEIN ACCESS;  Surgeon: Oris Krystal FALCON, MD;  Location: [facility];  Service: Vascular;  Laterality: Right;   AV FISTULA PLACEMENT Left 09/25/2022   Procedure: INSERTION OF LEFT FEMORAL LOOP GRAFT USING 4-5mm GORETEX STRETCH GRAFT;  Surgeon: [name], MD;  Location: [facility];  Service: Vascular;  Laterality: Left;   COLONOSCOPY N/A 12/25/2023   Procedure: COLONOSCOPY;  Surgeon: Cindie Carlin POUR, DO;  Location: [facility];  Service: Endoscopy;  Laterality: N/A;   DIALYSIS/PERMA CATHETER INSERTION  10/23/2016   Procedure: DIALYSIS/PERMA CATHETER INSERTION;  Surgeon: [name], MD;  Location: [facility];  Service: Cardiovascular;;   ESOPHAGOGASTRODUODENOSCOPY N/A 12/08/2023   Procedure: EGD (ESOPHAGOGASTRODUODENOSCOPY);  Surgeon: Eartha Flavors, Toribio, MD;  Location: [facility];  Service: Gastroenterology;  Laterality: N/A;   IR AV DIALY SHUNT INTRO NEEDLE/INTRACATH INITIAL W/PTA/IMG LEFT  05/12/2023   IR REMOVAL TUN CV CATH W/O FL  09/22/2017   IR REMOVAL TUN CV CATH W/O FL  01/06/2023   IR THROMBECTOMY AV FISTULA W/THROMBOLYSIS INC/SHUNT/IMG LEFT Left 09/04/2017   IR US  GUIDE VASC ACCESS LEFT  09/04/2017   IR US  GUIDE VASC ACCESS LEFT  05/12/2023   MASS EXCISION Left 06/01/2018   Procedure: EXCISION 5 X 3CM LIPOMA LEFT THIGH;  Surgeon: [name], MD;  Location: [facility];  Service: General;  Laterality: Left;   PERIPHERAL VASCULAR CATHETERIZATION Left 01/04/2015   Procedure: A/V  Shuntogram/Fistulagram;  Surgeon: Selinda RAMAN Marea, MD;  Location: [facility];  Service: Cardiovascular;  Laterality: Left;   PERIPHERAL VASCULAR CATHETERIZATION N/A 01/04/2015   Procedure: A/V Shunt Intervention;  Surgeon: Selinda RAMAN Marea, MD;  Location: [facility];  Service: Cardiovascular;  Laterality: N/A;   PERIPHERAL VASCULAR CATHETERIZATION Left 04/13/2015   Procedure: A/V Shuntogram/Fistulagram;  Surgeon: Cordella MATSU Jama, MD;  Location:  [facility];  Service: Cardiovascular;  Laterality: Left;   PERIPHERAL VASCULAR CATHETERIZATION Left 04/13/2015   Procedure: A/V Shunt Intervention;  Surgeon: [name], MD;  Location: [facility];  Service: Cardiovascular;  Laterality: Left;   REMOVAL OF A DIALYSIS CATHETER Left 09/25/2022   Procedure: REMOVAL OF LEFT INTERNAL JUGULAR HERO CATHETER;  Surgeon: [name], MD;  Location: [facility];  Service: Vascular;  Laterality: Left;   UPPER EXTREMITY VENOGRAPHY N/A 08/26/2022   Procedure: UPPER EXTREMITY VENOGRAPHY;  Surgeon: [name], MD;  Location: [facility];  Service: Cardiovascular;  Laterality: N/A;   VENOUS ANGIOPLASTY  09/15/2023   Procedure: VENOUS ANGIOPLASTY;  Surgeon: Lanis Fonda BRAVO, MD;  Location: [facility];  Service: Cardiovascular;;  leg loop graft    Family History  Problem Relation Age of Onset   Seizures Son    Kidney disease Mother        on dialysis   Lupus Mother     Social History   Socioeconomic History   Marital status: Single    Spouse name: Not on file   Number of children: Not on file   Years of education: Not on file   Highest education level: Not on file  Occupational History   Not on file  Tobacco Use   Smoking status: Every Day    Current packs/day: 0.10    Average packs/day: 0.1 packs/day for 15.0 years (1.5 ttl pk-yrs)    Types: Cigarettes    Passive exposure: Current   Smokeless tobacco: Never  Vaping Use   Vaping status: Never Used  Substance  and Sexual Activity   Alcohol  use: No   Drug use: No   Sexual activity: Not Currently    Birth control/protection: None, Abstinence  Other Topics Concern   Not on file  Social History Narrative   Lives at [facility]   Social Drivers of Health   Financial Resource Strain: Low Risk  (07/01/2022)   Overall Financial Resource Strain (CARDIA)    Difficulty of Paying Living Expenses: Not hard at all  Food Insecurity: No Food Insecurity (01/13/2024)   Hunger Vital Sign    Worried About Running Out of Food in the Last Year: Never true    Ran Out of Food in the Last Year: Never true  Transportation Needs: No Transportation Needs (01/13/2024)   PRAPARE - Administrator, Civil Service (Medical): No    Lack of Transportation (Non-Medical): No  Physical Activity: Inactive (07/01/2022)   Exercise Vital Sign    Days of Exercise per Week: 0 days    Minutes of Exercise per Session: 0 min  Stress: No Stress Concern Present (07/01/2022)   [employer] of Occupational Health - Occupational Stress Questionnaire    Feeling of Stress : Not at all  Social Connections: Moderately Isolated (12/23/2023)   Social Connection and Isolation Panel    Frequency of Communication with Friends and Family: Three times a week    Frequency of Social Gatherings with Friends and Family: Twice a week    Attends Religious Services: 1 to 4 times per year    Active Member of [employer] or Organizations: No    Attends Banker Meetings: Never    Marital Status: Never married  Intimate Partner Violence: Not At Risk (01/13/2024)   Humiliation, Afraid, Rape, and Kick questionnaire    Fear of Current or Ex-Partner: No    Emotionally Abused: No  Physically Abused: No    Sexually Abused: No     Review of System:   General: Negative for anorexia, weight loss, fever, chills, fatigue, weakness. Eyes: Negative for vision changes.  ENT: Negative for hoarseness, difficulty swallowing ,  nasal congestion. CV: Negative for chest pain, angina, palpitations, dyspnea on exertion, peripheral edema.  Respiratory: Negative for dyspnea at rest, dyspnea on exertion, cough, sputum, wheezing.  GI: See history of present illness. GU:  Negative for dysuria, hematuria, urinary incontinence, urinary frequency, nocturnal urination.  MS: Negative for joint pain, low back pain.  Derm: Negative for rash or itching.  Neuro: Negative for weakness, abnormal sensation, seizure, frequent headaches, memory loss, confusion.  Psych: Negative for anxiety, depression, suicidal ideation, hallucinations.  Endo: Negative for unusual weight change.  Heme: Negative for bruising or bleeding. Allergy: Negative for rash or hives.      Physical Examination:   Vital signs in last 24 hours: Temp:  [97.7 F (36.5 C)-98.9 F (37.2 C)] 97.7 F (36.5 C) (10/30 0805) Pulse Rate:  [77-88] 84 (10/30 0805) Resp:  [12-18] 18 (10/30 0805) BP: (100-134)/(52-83) 122/65 (10/30 0805) SpO2:  [97 %-100 %] 100 % (10/30 0805) Weight:  [39.8 kg] 39.8 kg (10/29 1158) Last BM Date : 01/13/24  General: Well-nourished, well-developed in no acute distress.  Head: Normocephalic, atraumatic.   Eyes: Conjunctiva pink, no icterus. Mouth: Oropharyngeal mucosa moist and pink , no lesions erythema or exudate. Neck: Supple without thyromegaly, masses, or lymphadenopathy.  Lungs: Clear to auscultation bilaterally.  Heart: 2/6 SEM, regular rate and rhythm.  Abdomen: Bowel sounds are normal, nontender, nondistended, no hepatosplenomegaly or masses, no abdominal bruits or hernia , no rebound or guarding.   Rectal: not performed Extremities: No lower extremity edema, clubbing, deformity.  Neuro: Alert and oriented x 4 , grossly normal neurologically.  Skin: Warm and dry, no rash or jaundice.   Psych: Alert and cooperative, normal mood and affect.        Intake/Output from previous day: 10/29 0701 - 10/30 0700 In: 590 [P.O.:240;  Blood:350] Out: -  Intake/Output this shift: No intake/output data recorded.  Lab Results:   CBC Recent Labs    01/13/24 1258 01/13/24 2002 01/14/24 0438  WBC 6.8  --  6.9  HGB 5.9* 7.8* 7.4*  HCT 18.0* 23.2* 22.0*  MCV 85.7  --  83.7  PLT 232  --  225   BMET Recent Labs    01/13/24 1258  NA 138  K 3.3*  CL 97*  CO2 32  GLUCOSE 77  BUN 10  CREATININE 2.52*  CALCIUM 8.5*   LFT Recent Labs    01/13/24 1258  BILITOT <0.2  ALKPHOS 76  AST 37  ALT 22  PROT 6.3*  ALBUMIN  3.9    Lipase No results for input(s): LIPASE in the last 72 hours.  PT/INR No results for input(s): LABPROT, INR in the last 72 hours.   Hepatitis Panel No results for input(s): HEPBSAG, HCVAB, HEPAIGM, HEPBIGM in the last 72 hours.   Imaging Studies:   DG Chest Port 1 View Result Date: 12/23/2023 EXAM: 1 VIEW(S) XRAY OF THE CHEST 12/23/2023 10:40:00 AM COMPARISON: 12/10/2023 CLINICAL HISTORY: weakness. Pt BIB [facility] from dialysis for c/o low hemoglobin; dialysis states pt's hemoglobin is 5.9; pt was seen here last week for same complaint and received blood; pt is unsure how many; ; Pt denies any sob or pain. Hx of DVT, hypertension. Current everyday smoker FINDINGS: LUNGS AND PLEURA: Interval increase in right  pleural effusion which is now moderate in volume compared to small. No focal pulmonary opacity. No pulmonary edema. No pneumothorax. HEART AND MEDIASTINUM: Vascular stents noted. No acute abnormality of the cardiac and mediastinal silhouettes. BONES AND SOFT TISSUES: No acute osseous abnormality. IMPRESSION: 1. Interval increase in right pleural effusion, now moderate in volume compared to small. 2. No focal pulmonary opacity, pulmonary edema, or pneumothorax. Electronically signed by: Norleen Boxer MD 12/23/2023 11:12 AM EDT RP Workstation: [id number]  [5 week]  Assessment:   48 y/o female with history of lupus, HTN, seizure disorder, intellectual disabilities, DVT, ESRD on  HD, anemia, depression with recent admissions for anemia found to have multiple colonic AVMs and ?Dieulafoy vs small ulcer in the sigmiod colon on colonoscopy 12/25/23, presenting by EMS from [facility] due to Hgb of 5.5.   Acute on chronic anemia/transfusion dependent: -in setting of Lupus, ESRD, colonic AVMs, recently treated sigmoid lesion as outlined above, Plavix  -history somewhat limited by her cognitive capabilities but she denies GI symptoms, no reported bleeding from GI or non-GI sources -Hgb 5.9 on arrive (9.8 on 10/11) -Hgb 7.8 after one unit prbcs -Hgb 7.4 today -B12, folate normal. Iron panel c/w anemia chronic disease -indication of Plavix  questioned on admission. She has been on this for many years. In remote documentation, stroke listed as indication?  Discussed with Dr. Cinderella, recommend look back at sigmoid colon site and small bowel enteroscopy as next step. She likely has additional small bowel angiodysplastic lesions   Plan:   Continue PPI.  Clear liquids today. Plan for colonoscopy with small bowel enteroscopy tomorrow.  I have discussed the risks, alternatives, benefits with regards to but not limited to the risk of reaction to medication, bleeding, infection, perforation and the patient is agreeable to proceed. Written consent to be obtained. Obtain consent from legal guardian.     LOS: 0 days   We would like to thank you for the opportunity to participate in the care of [name].  Sonny RAMAN. Ezzard RIGGERS [facility] [phone] 10/30/20258:26 AM

## 2024-01-14 NOTE — Consult Note (Signed)
 Gastroenterology Consult   Referring Provider: No ref. provider found Primary Care Physician:  Gammon, Chrystal, NP Primary Gastroenterologist:  Toribio Fortune, MD Patient ID: [name]; [id number]; 11-01-1975   Admit date: 01/13/2024  LOS: 0 days   Date of Consultation: 01/14/2024  Reason for Consultation:  recurrent anemia, likely due to gi bleed with history of colon AVMS. On plavix  for unknown indication.    History of Present Illness   [name] is a 48 y.o. female with history of lupus, HTN, seizure disorder, intellectual disabilities, DVT, ESRD on HD, anemia, depression with recent admissions for anemia found to have multiple colonic AVMs and ?Dieulafoy vs small ulcer in the sigmoid colon on colonoscopy 12/25/23, presenting by EMS from [facility] due to Hgb of 5.5.   ED course: Patient refused rectal exam. Hgb 5.9 (down from 9.8 on 12/26/23) K 3.3 B12 1207, Folate >20, iron 45, TIBC 224, iron sat 20, ferritin 549 Hgb up to 7.8 post one unit prbcs Hgb 7.4 this AM  GI consult: Patient with moderate intellectual disabilities.  She has a legal guardian with [facility] Department of Social Services. Seen September 23 during her first hospitalization with hemoglobin of 7, stools reported to be dark, Hemoccult positive.  Chronically on Plavix .  EGD with gastritis.  Received 1 unit of packed red blood cells.  Presented back on October 8 with hemoglobin of 5.3 (down from 8.4 on 9/25).  Completed colonoscopy, 5 nonbleeding colonic angiodysplastic lesions treated with APC.  Single bleeding lesion noted in the sigmoid colon possible Dieulafoy versus small ulcer status post clip.  Plavix  restarted 2 days later.  Patient denies any black or bloody stools.  Denies blood in the urine, vaginal bleeding, nosebleeds.  Denies abdominal pain.  No heartburn or dysphagia.  No vomiting.  Denies lightheadedness, dizziness.  EGD 12/08/23: -normal  esophagus -gastritis -normal examined duodenum  Colonoscopy 12/25/23: -non-bleeding internal hemorrhoids -single recently bleeding colonic angiodysplastic lesion treated with APC -four non-bleeding colonic angiodysplastic lesions treated with APC -single bleeding lesion in sigmoid colon ?Dieulafoy vs small ulcer s/p clip  Prior to Admission medications   Medication Sig Start Date End Date Taking? Authorizing Provider  albuterol  (PROVENTIL ) (2.5 MG/3ML) 0.083% nebulizer solution Take 2.5 mg by nebulization 4 (four) times daily as needed for wheezing or shortness of breath.     [provider]  ALPRAZolam  (XANAX ) 0.25 MG tablet Take 0.25 mg by mouth 2 (two) times daily.    [provider]  ammonium lactate  (AMLACTIN DAILY) 12 % lotion Apply 1 Application topically as needed for dry skin. 04/23/23   [name], DPM  aspirin EC 81 MG tablet Take 81 mg by mouth daily. Swallow whole.    [provider]  Calcium Carbonate Antacid (TUMS PO) Take 1,000 mg by mouth 2 (two) times daily between meals. 06/13/22   [provider]  chlorhexidine  (PERIDEX ) 0.12 % solution Use as directed 15 mLs in the mouth or throat 2 (two) times daily. After regular toothbrushing, rinse mouth 08/20/22   [provider]  clopidogrel  (PLAVIX ) 75 MG tablet Take 1 tablet (75 mg total) by mouth daily. 12/28/23   Darci Pore, MD  cycloSPORINE (RESTASIS) 0.05 % ophthalmic emulsion Place 1 drop into both eyes 2 (two) times daily. 01/31/21   [provider]  docusate sodium  (COLACE) 100 MG capsule Take 100 mg by mouth daily.    [provider]  epoetin  alfa (EPOGEN ,PROCRIT) 4000 UNIT/ML injection Inject 4,000 Units  into the vein See admin instructions. Mon, Wed, and Friday at dialysis    [provider]  guaiFENesin  (ROBITUSSIN) 100 MG/5ML liquid Take 200 mg by mouth every 6 (six) hours as needed for cough.     [provider]   hydroxychloroquine  (PLAQUENIL ) 200 MG tablet Take 200 mg by mouth daily.    [provider]  ipratropium (ATROVENT ) 0.02 % nebulizer solution Take 0.5 mg by nebulization 4 (four) times daily as needed for wheezing or shortness of breath (For Breathing).    [provider]  lidocaine -prilocaine  (EMLA ) cream Apply 1 application topically as needed (port access).  12/20/12   [provider]  loratadine (CLARITIN) 10 MG tablet Take 10 mg by mouth daily as needed for allergies.    [provider]  midodrine  (PROAMATINE ) 10 MG tablet Take 10 mg by mouth 2 (two) times daily.     [provider]  mirtazapine  (REMERON ) 15 MG tablet Take 7.5 mg by mouth at bedtime.  11/03/13   [provider]  multivitamin (RENA-VIT) TABS tablet Take 1 tablet by mouth daily.    [provider]  pantoprazole  (PROTONIX ) 40 MG tablet Take 40 mg by mouth daily.    [provider]  phenytoin  (DILANTIN ) 100 MG ER capsule Take 100 mg by mouth 2 (two) times daily.     [provider]  sertraline  (ZOLOFT ) 100 MG tablet Take 200 mg by mouth daily.     [provider]  simvastatin  (ZOCOR ) 40 MG tablet Take 40 mg by mouth at bedtime.    [provider]    Current Facility-Administered Medications  Medication Dose Route Frequency Provider Last Rate Last Admin   acetaminophen  (TYLENOL ) tablet 650 mg  650 mg Oral Q6H PRN Emokpae, Ejiroghene E, MD       Or   acetaminophen  (TYLENOL ) suppository 650 mg  650 mg Rectal Q6H PRN Emokpae, Ejiroghene E, MD       [START ON 01/15/2024] calcitRIOL  (ROCALTROL ) capsule 1.75 mcg  1.75 mcg Oral Q M,W,F-HD Norine Manuelita LABOR, MD       Chlorhexidine  Gluconate Cloth 2 % PADS 6 each  6 each Topical Q0600 Rayburn Pac, MD   6 each at 01/14/24 0457   midodrine  (PROAMATINE ) tablet 10 mg  10 mg Oral BID WC Emokpae, Ejiroghene E, MD   10 mg at 01/14/24 0809   multivitamin (RENA-VIT) tablet 1 tablet  1 tablet  Oral Daily Emokpae, Ejiroghene E, MD       ondansetron  (ZOFRAN ) tablet 4 mg  4 mg Oral Q6H PRN Emokpae, Ejiroghene E, MD       Or   ondansetron  (ZOFRAN ) injection 4 mg  4 mg Intravenous Q6H PRN Emokpae, Ejiroghene E, MD       pantoprazole  (PROTONIX ) injection 40 mg  40 mg Intravenous Q12H Emokpae, Ejiroghene E, MD   40 mg at 01/14/24 0802   phenytoin  (DILANTIN ) ER capsule 100 mg  100 mg Oral BID Emokpae, Ejiroghene E, MD   100 mg at 01/13/24 2103   polyethylene glycol (MIRALAX  / GLYCOLAX ) packet 17 g  17 g Oral Daily PRN Emokpae, Ejiroghene E, MD       potassium chloride  (KLOR-CON ) packet 20 mEq  20 mEq Oral Daily Norine Manuelita LABOR, MD        Allergies as of 01/13/2024 - Review Complete 01/13/2024  Allergen Reaction Noted   Dust mite extract Other (See Comments) 11/08/2013    Past Medical History:  Diagnosis Date  Anemia    BV (bacterial vaginosis) 11/08/2013   Depression    Dialysis patient    DVT (deep venous thrombosis) (HCC)    Hypertension    Lupus    Renal disorder    Vaginal odor 11/08/2013    Past Surgical History:  Procedure Laterality Date   A/V SHUNT INTERVENTION N/A 10/24/2016   Procedure: A/V SHUNT INTERVENTION;  Surgeon: Jama Cordella MATSU, MD;  Location: [facility];  Service: Cardiovascular;  Laterality: N/A;   A/V SHUNT INTERVENTION Left 09/15/2023   Procedure: A/V SHUNT INTERVENTION;  Surgeon: Lanis Fonda BRAVO, MD;  Location: [facility];  Service: Cardiovascular;  Laterality: Left;   A/V SHUNTOGRAM Left 12/08/2017   Procedure: A/V SHUNTOGRAM;  Surgeon: Jama Cordella MATSU, MD;  Location: [facility];  Service: Cardiovascular;  Laterality: Left;   A/V SHUNTOGRAM Left 01/28/2018   Procedure: A/V SHUNTOGRAM;  Surgeon: [name], MD;  Location: [facility];  Service: Cardiovascular;  Laterality: Left;   A/V SHUNTOGRAM Left 04/01/2018   Procedure: A/V SHUNTOGRAM;  Surgeon: [name], MD;  Location: [facility];  Service:  Cardiovascular;  Laterality: Left;   A/V SHUNTOGRAM Left 04/28/2019   Procedure: A/V SHUNTOGRAM;  Surgeon: [name], MD;  Location: [facility];  Service: Cardiovascular;  Laterality: Left;   AV FISTULA PLACEMENT     AV FISTULA PLACEMENT Left 02/09/2022   Procedure: FISTULAGRAM, placement of left arm stent.;  Surgeon: [name], MD;  Location: [facility];  Service: Vascular;  Laterality: Left;   AV FISTULA PLACEMENT Right 09/02/2022   Procedure: EXPLORATION OF RIGHT AXILLA, INADEQUATE VEIN ACCESS;  Surgeon: Oris Krystal FALCON, MD;  Location: [facility];  Service: Vascular;  Laterality: Right;   AV FISTULA PLACEMENT Left 09/25/2022   Procedure: INSERTION OF LEFT FEMORAL LOOP GRAFT USING 4-5mm GORETEX STRETCH GRAFT;  Surgeon: [name], MD;  Location: [facility];  Service: Vascular;  Laterality: Left;   COLONOSCOPY N/A 12/25/2023   Procedure: COLONOSCOPY;  Surgeon: Cindie Carlin POUR, DO;  Location: [facility];  Service: Endoscopy;  Laterality: N/A;   DIALYSIS/PERMA CATHETER INSERTION  10/23/2016   Procedure: DIALYSIS/PERMA CATHETER INSERTION;  Surgeon: [name], MD;  Location: [facility];  Service: Cardiovascular;;   ESOPHAGOGASTRODUODENOSCOPY N/A 12/08/2023   Procedure: EGD (ESOPHAGOGASTRODUODENOSCOPY);  Surgeon: Eartha Flavors, Toribio, MD;  Location: [facility];  Service: Gastroenterology;  Laterality: N/A;   IR AV DIALY SHUNT INTRO NEEDLE/INTRACATH INITIAL W/PTA/IMG LEFT  05/12/2023   IR REMOVAL TUN CV CATH W/O FL  09/22/2017   IR REMOVAL TUN CV CATH W/O FL  01/06/2023   IR THROMBECTOMY AV FISTULA W/THROMBOLYSIS INC/SHUNT/IMG LEFT Left 09/04/2017   IR US  GUIDE VASC ACCESS LEFT  09/04/2017   IR US  GUIDE VASC ACCESS LEFT  05/12/2023   MASS EXCISION Left 06/01/2018   Procedure: EXCISION 5 X 3CM LIPOMA LEFT THIGH;  Surgeon: [name], MD;  Location: [facility];  Service: General;  Laterality: Left;   PERIPHERAL VASCULAR CATHETERIZATION Left 01/04/2015   Procedure: A/V  Shuntogram/Fistulagram;  Surgeon: Selinda RAMAN Marea, MD;  Location: [facility];  Service: Cardiovascular;  Laterality: Left;   PERIPHERAL VASCULAR CATHETERIZATION N/A 01/04/2015   Procedure: A/V Shunt Intervention;  Surgeon: Selinda RAMAN Marea, MD;  Location: [facility];  Service: Cardiovascular;  Laterality: N/A;   PERIPHERAL VASCULAR CATHETERIZATION Left 04/13/2015   Procedure: A/V Shuntogram/Fistulagram;  Surgeon: Cordella MATSU Jama, MD;  Location:  [facility];  Service: Cardiovascular;  Laterality: Left;   PERIPHERAL VASCULAR CATHETERIZATION Left 04/13/2015   Procedure: A/V Shunt Intervention;  Surgeon: [name], MD;  Location: [facility];  Service: Cardiovascular;  Laterality: Left;   REMOVAL OF A DIALYSIS CATHETER Left 09/25/2022   Procedure: REMOVAL OF LEFT INTERNAL JUGULAR HERO CATHETER;  Surgeon: [name], MD;  Location: [facility];  Service: Vascular;  Laterality: Left;   UPPER EXTREMITY VENOGRAPHY N/A 08/26/2022   Procedure: UPPER EXTREMITY VENOGRAPHY;  Surgeon: [name], MD;  Location: [facility];  Service: Cardiovascular;  Laterality: N/A;   VENOUS ANGIOPLASTY  09/15/2023   Procedure: VENOUS ANGIOPLASTY;  Surgeon: Lanis Fonda BRAVO, MD;  Location: [facility];  Service: Cardiovascular;;  leg loop graft    Family History  Problem Relation Age of Onset   Seizures Son    Kidney disease Mother        on dialysis   Lupus Mother     Social History   Socioeconomic History   Marital status: Single    Spouse name: Not on file   Number of children: Not on file   Years of education: Not on file   Highest education level: Not on file  Occupational History   Not on file  Tobacco Use   Smoking status: Every Day    Current packs/day: 0.10    Average packs/day: 0.1 packs/day for 15.0 years (1.5 ttl pk-yrs)    Types: Cigarettes    Passive exposure: Current   Smokeless tobacco: Never  Vaping Use   Vaping status: Never Used  Substance  and Sexual Activity   Alcohol  use: No   Drug use: No   Sexual activity: Not Currently    Birth control/protection: None, Abstinence  Other Topics Concern   Not on file  Social History Narrative   Lives at [facility]   Social Drivers of Health   Financial Resource Strain: Low Risk  (07/01/2022)   Overall Financial Resource Strain (CARDIA)    Difficulty of Paying Living Expenses: Not hard at all  Food Insecurity: No Food Insecurity (01/13/2024)   Hunger Vital Sign    Worried About Running Out of Food in the Last Year: Never true    Ran Out of Food in the Last Year: Never true  Transportation Needs: No Transportation Needs (01/13/2024)   PRAPARE - Administrator, Civil Service (Medical): No    Lack of Transportation (Non-Medical): No  Physical Activity: Inactive (07/01/2022)   Exercise Vital Sign    Days of Exercise per Week: 0 days    Minutes of Exercise per Session: 0 min  Stress: No Stress Concern Present (07/01/2022)   [employer] of Occupational Health - Occupational Stress Questionnaire    Feeling of Stress : Not at all  Social Connections: Moderately Isolated (12/23/2023)   Social Connection and Isolation Panel    Frequency of Communication with Friends and Family: Three times a week    Frequency of Social Gatherings with Friends and Family: Twice a week    Attends Religious Services: 1 to 4 times per year    Active Member of [employer] or Organizations: No    Attends Banker Meetings: Never    Marital Status: Never married  Intimate Partner Violence: Not At Risk (01/13/2024)   Humiliation, Afraid, Rape, and Kick questionnaire    Fear of Current or Ex-Partner: No    Emotionally Abused: No  Physically Abused: No    Sexually Abused: No     Review of System:   General: Negative for anorexia, weight loss, fever, chills, fatigue, weakness. Eyes: Negative for vision changes.  ENT: Negative for hoarseness, difficulty swallowing ,  nasal congestion. CV: Negative for chest pain, angina, palpitations, dyspnea on exertion, peripheral edema.  Respiratory: Negative for dyspnea at rest, dyspnea on exertion, cough, sputum, wheezing.  GI: See history of present illness. GU:  Negative for dysuria, hematuria, urinary incontinence, urinary frequency, nocturnal urination.  MS: Negative for joint pain, low back pain.  Derm: Negative for rash or itching.  Neuro: Negative for weakness, abnormal sensation, seizure, frequent headaches, memory loss, confusion.  Psych: Negative for anxiety, depression, suicidal ideation, hallucinations.  Endo: Negative for unusual weight change.  Heme: Negative for bruising or bleeding. Allergy: Negative for rash or hives.      Physical Examination:   Vital signs in last 24 hours: Temp:  [97.7 F (36.5 C)-98.9 F (37.2 C)] 97.7 F (36.5 C) (10/30 0805) Pulse Rate:  [77-88] 84 (10/30 0805) Resp:  [12-18] 18 (10/30 0805) BP: (100-134)/(52-83) 122/65 (10/30 0805) SpO2:  [97 %-100 %] 100 % (10/30 0805) Weight:  [39.8 kg] 39.8 kg (10/29 1158) Last BM Date : 01/13/24  General: Well-nourished, well-developed in no acute distress.  Head: Normocephalic, atraumatic.   Eyes: Conjunctiva pink, no icterus. Mouth: Oropharyngeal mucosa moist and pink , no lesions erythema or exudate. Neck: Supple without thyromegaly, masses, or lymphadenopathy.  Lungs: Clear to auscultation bilaterally.  Heart: 2/6 SEM, regular rate and rhythm.  Abdomen: Bowel sounds are normal, nontender, nondistended, no hepatosplenomegaly or masses, no abdominal bruits or hernia , no rebound or guarding.   Rectal: not performed Extremities: No lower extremity edema, clubbing, deformity.  Neuro: Alert and oriented x 4 , grossly normal neurologically.  Skin: Warm and dry, no rash or jaundice.   Psych: Alert and cooperative, normal mood and affect.        Intake/Output from previous day: 10/29 0701 - 10/30 0700 In: 590 [P.O.:240;  Blood:350] Out: -  Intake/Output this shift: No intake/output data recorded.  Lab Results:   CBC Recent Labs    01/13/24 1258 01/13/24 2002 01/14/24 0438  WBC 6.8  --  6.9  HGB 5.9* 7.8* 7.4*  HCT 18.0* 23.2* 22.0*  MCV 85.7  --  83.7  PLT 232  --  225   BMET Recent Labs    01/13/24 1258  NA 138  K 3.3*  CL 97*  CO2 32  GLUCOSE 77  BUN 10  CREATININE 2.52*  CALCIUM 8.5*   LFT Recent Labs    01/13/24 1258  BILITOT <0.2  ALKPHOS 76  AST 37  ALT 22  PROT 6.3*  ALBUMIN  3.9    Lipase No results for input(s): LIPASE in the last 72 hours.  PT/INR No results for input(s): LABPROT, INR in the last 72 hours.   Hepatitis Panel No results for input(s): HEPBSAG, HCVAB, HEPAIGM, HEPBIGM in the last 72 hours.   Imaging Studies:   DG Chest Port 1 View Result Date: 12/23/2023 EXAM: 1 VIEW(S) XRAY OF THE CHEST 12/23/2023 10:40:00 AM COMPARISON: 12/10/2023 CLINICAL HISTORY: weakness. Pt BIB [facility] from dialysis for c/o low hemoglobin; dialysis states pt's hemoglobin is 5.9; pt was seen here last week for same complaint and received blood; pt is unsure how many; ; Pt denies any sob or pain. Hx of DVT, hypertension. Current everyday smoker FINDINGS: LUNGS AND PLEURA: Interval increase in right  pleural effusion which is now moderate in volume compared to small. No focal pulmonary opacity. No pulmonary edema. No pneumothorax. HEART AND MEDIASTINUM: Vascular stents noted. No acute abnormality of the cardiac and mediastinal silhouettes. BONES AND SOFT TISSUES: No acute osseous abnormality. IMPRESSION: 1. Interval increase in right pleural effusion, now moderate in volume compared to small. 2. No focal pulmonary opacity, pulmonary edema, or pneumothorax. Electronically signed by: Norleen Boxer MD 12/23/2023 11:12 AM EDT RP Workstation: [id number]  [5 week]  Assessment:   48 y/o female with history of lupus, HTN, seizure disorder, intellectual disabilities, DVT, ESRD on  HD, anemia, depression with recent admissions for anemia found to have multiple colonic AVMs and ?Dieulafoy vs small ulcer in the sigmiod colon on colonoscopy 12/25/23, presenting by EMS from [facility] due to Hgb of 5.5.   Acute on chronic anemia/transfusion dependent: -in setting of Lupus, ESRD, colonic AVMs, recently treated sigmoid lesion as outlined above, Plavix  -history somewhat limited by her cognitive capabilities but she denies GI symptoms, no reported bleeding from GI or non-GI sources -Hgb 5.9 on arrive (9.8 on 10/11) -Hgb 7.8 after one unit prbcs -Hgb 7.4 today -B12, folate normal. Iron panel c/w anemia chronic disease -indication of Plavix  questioned on admission. She has been on this for many years. In remote documentation, stroke listed as indication?  Discussed with Dr. Cinderella, recommend look back at sigmoid colon site and small bowel enteroscopy as next step. She likely has additional small bowel angiodysplastic lesions   Plan:   Continue PPI.  Clear liquids today. Plan for colonoscopy with small bowel enteroscopy tomorrow.  I have discussed the risks, alternatives, benefits with regards to but not limited to the risk of reaction to medication, bleeding, infection, perforation and the patient is agreeable to proceed. Written consent to be obtained. Obtain consent from legal guardian.     LOS: 0 days   We would like to thank you for the opportunity to participate in the care of [name].  Sonny RAMAN. Ezzard RIGGERS [facility] [phone] 10/30/20258:26 AM

## 2024-01-15 ENCOUNTER — Encounter (HOSPITAL_COMMUNITY): Payer: Self-pay | Admitting: Internal Medicine

## 2024-01-15 ENCOUNTER — Encounter (HOSPITAL_COMMUNITY)
Admission: EM | Disposition: A | Discharge status: Skilled Nursing Facility | Payer: Self-pay | Source: Ambulatory Visit | Attending: Emergency Medicine

## 2024-01-15 ENCOUNTER — Observation Stay (HOSPITAL_COMMUNITY): Admitting: Certified Registered"

## 2024-01-15 DIAGNOSIS — N186 End stage renal disease: Secondary | ICD-10-CM | POA: Diagnosis not present

## 2024-01-15 DIAGNOSIS — D649 Anemia, unspecified: Principal | ICD-10-CM

## 2024-01-15 DIAGNOSIS — K648 Other hemorrhoids: Secondary | ICD-10-CM | POA: Diagnosis not present

## 2024-01-15 DIAGNOSIS — G40909 Epilepsy, unspecified, not intractable, without status epilepticus: Secondary | ICD-10-CM | POA: Diagnosis not present

## 2024-01-15 DIAGNOSIS — I120 Hypertensive chronic kidney disease with stage 5 chronic kidney disease or end stage renal disease: Secondary | ICD-10-CM | POA: Diagnosis not present

## 2024-01-15 DIAGNOSIS — K5520 Angiodysplasia of colon without hemorrhage: Secondary | ICD-10-CM | POA: Diagnosis not present

## 2024-01-15 DIAGNOSIS — K31819 Angiodysplasia of stomach and duodenum without bleeding: Secondary | ICD-10-CM

## 2024-01-15 DIAGNOSIS — D62 Acute posthemorrhagic anemia: Principal | ICD-10-CM

## 2024-01-15 DIAGNOSIS — Z992 Dependence on renal dialysis: Secondary | ICD-10-CM | POA: Diagnosis not present

## 2024-01-15 HISTORY — PX: COLONOSCOPY: SHX5424

## 2024-01-15 HISTORY — PX: ENTEROSCOPY: SHX5533

## 2024-01-15 LAB — RENAL FUNCTION PANEL
Albumin: 3.8 g/dL (ref 3.5–5.0)
Anion gap: 12 (ref 5–15)
BUN: 18 mg/dL (ref 6–20)
CO2: 27 mmol/L (ref 22–32)
Calcium: 8.9 mg/dL (ref 8.9–10.3)
Chloride: 94 mmol/L — ABNORMAL LOW (ref 98–111)
Creatinine, Ser: 5.31 mg/dL — ABNORMAL HIGH (ref 0.44–1.00)
GFR, Estimated: 9 mL/min — ABNORMAL LOW (ref >60)
Glucose, Bld: 78 mg/dL (ref 70–99)
Phosphorus: 3.2 mg/dL (ref 2.5–4.6)
Potassium: 4.1 mmol/L (ref 3.5–5.1)
Sodium: 133 mmol/L — ABNORMAL LOW (ref 135–145)

## 2024-01-15 LAB — CBC
HCT: 25.7 % — ABNORMAL LOW (ref 36.0–46.0)
Hemoglobin: 8.3 g/dL — ABNORMAL LOW (ref 12.0–15.0)
MCH: 28.6 pg (ref 26.0–34.0)
MCHC: 32.3 g/dL (ref 30.0–36.0)
MCV: 88.6 fL (ref 80.0–100.0)
Platelets: 267 K/uL (ref 150–400)
RBC: 2.9 MIL/uL — ABNORMAL LOW (ref 3.87–5.11)
RDW: 15.7 % — ABNORMAL HIGH (ref 11.5–15.5)
WBC: 8.2 K/uL (ref 4.0–10.5)
nRBC: 0 % (ref 0.0–0.2)

## 2024-01-15 LAB — HEPATITIS B SURFACE ANTIBODY, QUANTITATIVE: Hep B S AB Quant (Post): 92.6 m[IU]/mL

## 2024-01-15 LAB — OCCULT BLOOD X 1 CARD TO LAB, STOOL: Fecal Occult Bld: NEGATIVE

## 2024-01-15 SURGERY — COLONOSCOPY
Anesthesia: Monitor Anesthesia Care

## 2024-01-15 MED ORDER — FENTANYL CITRATE (PF) 100 MCG/2ML IJ SOLN
INTRAMUSCULAR | Status: AC
  Filled 2024-01-15: qty 2

## 2024-01-15 MED ORDER — SODIUM CHLORIDE 0.9 % IV SOLN: Freq: Once | INTRAVENOUS | Status: AC

## 2024-01-15 MED ORDER — FENTANYL CITRATE (PF) 100 MCG/2ML IJ SOLN
INTRAMUSCULAR | Status: DC | PRN
  Administered 2024-01-15 (×2): 50 ug via INTRAVENOUS

## 2024-01-15 MED ORDER — LIDOCAINE-PRILOCAINE 2.5-2.5 % EX CREA: 1.0000 | TOPICAL_CREAM | CUTANEOUS | Status: DC | PRN

## 2024-01-15 MED ORDER — STERILE WATER FOR IRRIGATION IR SOLN
Status: DC | PRN
  Administered 2024-01-15: 60 mL

## 2024-01-15 MED ORDER — SODIUM CHLORIDE 0.9 % IV SOLN
INTRAVENOUS | Status: DC | PRN
Start: 2024-01-15 — End: 2024-01-15

## 2024-01-15 MED ORDER — LACTATED RINGERS IV SOLN: INTRAVENOUS | Status: DC

## 2024-01-15 MED ORDER — PROPOFOL 10 MG/ML IV BOLUS
INTRAVENOUS | Status: DC | PRN
Start: 2024-01-15 — End: 2024-01-15
  Administered 2024-01-15: 50 mg via INTRAVENOUS

## 2024-01-15 MED ORDER — LIDOCAINE HCL (PF) 1 % IJ SOLN: 5.0000 mL | INTRAMUSCULAR | Status: DC | PRN

## 2024-01-15 MED ORDER — ALTEPLASE 2 MG IJ SOLR: 2.0000 mg | Freq: Once | INTRAMUSCULAR | Status: DC | PRN

## 2024-01-15 MED ORDER — MIDAZOLAM HCL (PF) 2 MG/2ML IJ SOLN
INTRAMUSCULAR | Status: DC | PRN
  Administered 2024-01-15: 2 mg via INTRAVENOUS

## 2024-01-15 MED ORDER — SERTRALINE HCL 50 MG PO TABS
100.0000 mg | ORAL_TABLET | Freq: Every day | ORAL | Status: DC
  Administered 2024-01-15: 100 mg via ORAL
  Filled 2024-01-15: qty 2

## 2024-01-15 MED ORDER — NEPRO/CARBSTEADY PO LIQD: 237.0000 mL | ORAL | Status: DC | PRN

## 2024-01-15 MED ORDER — ANTICOAGULANT SODIUM CITRATE 4% (200MG/5ML) IV SOLN: 5.0000 mL | Status: DC | PRN

## 2024-01-15 MED ORDER — HEPARIN SODIUM (PORCINE) 1000 UNIT/ML DIALYSIS
1000.0000 [IU] | INTRAMUSCULAR | Status: DC | PRN
Start: 2024-01-15 — End: 2024-01-15

## 2024-01-15 MED ORDER — PROPOFOL 500 MG/50ML IV EMUL
INTRAVENOUS | Status: AC
  Filled 2024-01-15: qty 50

## 2024-01-15 MED ORDER — MIDAZOLAM HCL 2 MG/2ML IJ SOLN
INTRAMUSCULAR | Status: AC
  Filled 2024-01-15: qty 2

## 2024-01-15 MED ORDER — PENTAFLUOROPROP-TETRAFLUOROETH EX AERO
1.0000 | INHALATION_SPRAY | CUTANEOUS | Status: DC | PRN
  Administered 2024-01-15: 1 via TOPICAL
  Filled 2024-01-15: qty 30

## 2024-01-15 NOTE — Op Note (Signed)
 [facility] Patient Name: [name] Procedure Date: 01/15/2024 2:48 PM MRN: [id number] Date of Birth: [birth year] Attending MD: Deatrice Dine , MD, [id number] CSN: [id number] Age: 48 Admit Type: Inpatient Procedure:                Colonoscopy Indications:              Gastrointestinal occult blood loss, Acute post                            hemorrhagic anemia Providers:                Deatrice Dine, MD, Madelin Hunter, RN, Dorcas Lenis, Technician Referring MD:              Medicines:                Monitored Anesthesia Care Complications:            No immediate complications. Estimated Blood Loss:     Estimated blood loss: none. Procedure:                Pre-Anesthesia Assessment:                           - Prior to the procedure, a History and Physical                            was performed, and patient medications and                            allergies were reviewed. The patient's tolerance of                            previous anesthesia was also reviewed. The risks                            and benefits of the procedure and the sedation                            options and risks were discussed with the patient.                            All questions were answered, and informed consent                            was obtained. Prior Anticoagulants: The patient has                            taken Plavix  (clopidogrel ), last dose was 3 days                            prior to procedure. ASA Grade Assessment: III - A  patient with severe systemic disease. After                            reviewing the risks and benefits, the patient was                            deemed in satisfactory condition to undergo the                            procedure.                           After obtaining informed consent, the colonoscope                            was passed under direct vision. Throughout the                             procedure, the patient's blood pressure, pulse, and                            oxygen saturations were monitored continuously. The                            PCF-HQ190L ([id number]) Peds Colon was introduced                            through the anus and advanced to the the terminal                            ileum. The colonoscopy was performed without                            difficulty. The patient tolerated the procedure                            well. The quality of the bowel preparation was                            evaluated using the [facility] Bowel Preparation                            Scale) with scores of: Right Colon = 3, Transverse                            Colon = 3 and Left Colon = 3 (entire mucosa seen                            well with no residual staining, small fragments of                            stool or opaque liquid). The total BBPS score  equals 9. The terminal ileum, ileocecal valve,                            appendiceal orifice, and rectum were photographed. Scope In: 3:35:53 PM Scope Out: 3:56:08 PM Scope Withdrawal Time: 0 hours 13 minutes 39 seconds  Total Procedure Duration: 0 hours 20 minutes 15 seconds  Findings:      The perianal and digital rectal examinations were normal.      A single small angioectasia without bleeding was found in the ascending       colon. Coagulation for hemostasis using argon plasma at 0.3       liters/minute and 20 watts was successful.      Two small angioectasias without bleeding were found in the cecum.       Coagulation for hemostasis using argon plasma at 0.3 liters/minute and       20 watts was successful. Estimated blood loss: none.      Non-bleeding internal hemorrhoids were found during retroflexion. The       hemorrhoids were small.      The terminal ileum appeared normal. Impression:               - A single non-bleeding colonic angioectasia.                             Treated with argon plasma coagulation (APC).                           - Two non-bleeding colonic angioectasias. Treated                            with argon plasma coagulation (APC).                           - Non-bleeding internal hemorrhoids.                           - The examined portion of the ileum was normal.                           _hemoclip seen in the sigmoid colon                           - No specimens collected. Moderate Sedation:      Per Anesthesia Care Recommendation:           Reconsider continued clinical need for                            antiplatelets; but no contraindication from GI                            perspective to continue                           restart diet                           may consider somatostatin as outpatient given  patient angioectasias Procedure Code(s):        --- Professional ---                           [phone], Colonoscopy, flexible; with control of                            bleeding, any method Diagnosis Code(s):        --- Professional ---                           K55.20, Angiodysplasia of colon without hemorrhage                           K64.8, Other hemorrhoids                           R19.5, Other fecal abnormalities                           D62, Acute posthemorrhagic anemia CPT copyright 2022 American Medical Association. All rights reserved. The codes documented in this report are preliminary and upon coder review may  be revised to meet current compliance requirements. Deatrice Dine, MD Deatrice Dine, MD 01/15/2024 4:09:11 PM This report has been signed electronically. Number of Addenda: 0

## 2024-01-15 NOTE — Transfer of Care (Signed)
 Immediate Anesthesia Transfer of Care Note  Patient: [name]  Procedure(s) Performed: COLONOSCOPY ENTEROSCOPY  Patient Location: PACU  Anesthesia Type:MAC  Level of Consciousness: awake, oriented, and drowsy  Airway & Oxygen Therapy: Patient Spontanous Breathing  Post-op Assessment: Report given to RN and Post -op Vital signs reviewed and stable  Post vital signs: Reviewed and stable  Last Vitals:  Vitals Value Taken Time  BP 101/75 01/15/24 16:02  Temp    Pulse    Resp 13 01/15/24 16:04  SpO2    Vitals shown include unfiled device data.  Last Pain:  Vitals:   01/15/24 1500  TempSrc:   PainSc: 0-No pain         Complications: No notable events documented.

## 2024-01-15 NOTE — Interval H&P Note (Signed)
 History and Physical Interval Note:  01/15/2024 2:48 PM  [name]  has presented today for surgery, with the diagnosis of Acute on chronic anemia due to obscure gastrointestinal bleeding, sigmoid colon lesion with previous bleeding.  The various methods of treatment have been discussed with the patient and family. After consideration of risks, benefits and other options for treatment, the patient has consented to  Procedure(s): COLONOSCOPY (N/A) ENTEROSCOPY (N/A) as a surgical intervention.  The patient's history has been reviewed, patient examined, no change in status, stable for surgery.  I have reviewed the patient's chart and labs.  Questions were answered to the patient's satisfaction.     We will proceed with EGD with push enteroscopy /Colonoscopy as scheduled.    I thoroughly discussed with the patients legal guardian [name] [phone] over the phone the procedure, including the risks involved. patients legal guardian [name] understands what the procedure involves including the benefits and any risks. patients legal guardian [name] understands alternatives to the proposed procedure. Risks including (but not limited to) bleeding, tearing of the lining (perforation), rupture of adjacent organs, problems with heart and lung function, infection, and medication reactions. A small percentage of complications may require surgery, hospitalization, repeat endoscopic procedure, and/or transfusion. patients legal guardian [name] understood and agreed. Also patient seem to have understanding and explained as much as possible and is agreeable    [name]

## 2024-01-15 NOTE — Op Note (Signed)
 [facility] Patient Name: [name] Procedure Date: 01/15/2024 2:49 PM MRN: [id number] Date of Birth: 09-29-1975 Attending MD: Deatrice Dine , MD, [id number] CSN: [id number] Age: 48 Admit Type: Inpatient Procedure:                Small bowel enteroscopy Indications:              Acute post hemorrhagic anemia, Occult blood in stool Providers:                Deatrice Dine, MD, Madelin Hunter, RN, Dorcas Lenis, Technician Referring MD:              Medicines:                Monitored Anesthesia Care Complications:            No immediate complications. Estimated Blood Loss:     Estimated blood loss: none. Procedure:                Pre-Anesthesia Assessment:                           - Prior to the procedure, a History and Physical                            was performed, and patient medications and                            allergies were reviewed. The patient's tolerance of                            previous anesthesia was also reviewed. The risks                            and benefits of the procedure and the sedation                            options and risks were discussed with the patient.                            All questions were answered, and informed consent                            was obtained. Prior Anticoagulants: The patient has                            taken Plavix  (clopidogrel ), last dose was 3 days                            prior to procedure. ASA Grade Assessment: III - A                            patient with severe systemic disease. After  reviewing the risks and benefits, the patient was                            deemed in satisfactory condition to undergo the                            procedure.                           After obtaining informed consent, the endoscope was                            passed under direct vision. Throughout the                            procedure, the  patient's blood pressure, pulse, and                            oxygen saturations were monitored continuously. The                            PCF-PH190L ([id number]) Ultra Slim Colon was                            introduced through the mouth and advanced to the                            proximal jejunum. The small bowel enteroscopy was                            accomplished without difficulty. The patient                            tolerated the procedure well. Scope In: 3:14:35 PM Scope Out: 3:29:39 PM Total Procedure Duration: 0 hours 15 minutes 4 seconds  Findings:      The esophagus was normal.      The stomach was normal.      There was no evidence of significant pathology in the duodenal bulb and       in the second portion of the duodenum.      A single angioectasia with no bleeding was found in the proximal       jejunum. Coagulation for hemostasis using argon plasma at 0.3       liters/minute and 20 watts was successful. Impression:               - Normal esophagus.                           - Normal stomach.                           - Normal duodenal bulb and second portion of the                            duodenum.                           -  A single non-bleeding angioectasia in the                            jejunum. Treated with argon plasma coagulation                            (APC).                           - No specimens collected. Moderate Sedation:      Per Anesthesia Care Recommendation:           Proceed with colonoscopy Procedure Code(s):        --- Professional ---                           [phone], Small intestinal endoscopy, enteroscopy                            beyond second portion of duodenum, not including                            ileum; with control of bleeding (eg, injection,                            bipolar cautery, unipolar cautery, laser, heater                            probe, stapler, plasma coagulator) Diagnosis Code(s):        ---  Professional ---                           X44.79, Angiodysplasia of colon without hemorrhage                           D62, Acute posthemorrhagic anemia                           R19.5, Other fecal abnormalities CPT copyright 2022 American Medical Association. All rights reserved. The codes documented in this report are preliminary and upon coder review may  be revised to meet current compliance requirements. Deatrice Dine, MD Deatrice Dine, MD 01/15/2024 3:34:21 PM This report has been signed electronically. Number of Addenda: 0

## 2024-01-15 NOTE — Procedures (Signed)
 Received patient in bed to unit.  Alert and oriented.  Informed consent signed and in chart.  All procedures explained. L thigh AVG cannulated x 2 with 15 g needles per policy. Tx initiated per MD order. UF goal 2500 ml as tolerated. 1200 B/P trending down, UF goal reduced to 2000 ml.  TX duration:3.5 hours  Tx complete. Blood returned. Needles removed, sites held x 2 until hemostasis achieved. Guaze changed prior to taping.  Patient tolerated well.  Transported back to Short Stay Alert, without acute distress.  Hand-off given to patient's nurse.   Access used: L thigh AVG Access issues: none  Total UF removed: 2000 ml Medication(s) given: See MAR   [name]

## 2024-01-15 NOTE — Progress Notes (Signed)
 [facility] Progress Note   Subjective:   No new issues.  Hb stable this AM.  GI plan is push enterosocopy today eval small bowel AVMs ameable to ablation + colonoscopy if she can do prep.  HD for this AM.  She has no c/o.  Objective Vitals:   01/14/24 1252 01/14/24 1918 01/15/24 0300 01/15/24 0550  BP: 116/69 (!) 140/66 (!) 125/54 125/64  Pulse: 77 81 79 78  Resp:  18 18 18   Temp: 97.7 F (36.5 C) 97.7 F (36.5 C) (!) 97.3 F (36.3 C) 97.7 F (36.5 C)  TempSrc: Oral Oral Oral Oral  SpO2: 100% 98% 100% 100%  Weight:      Height:       Physical Exam General appearance: no distress and awake and alert Head: Normocephalic, without obvious abnormality, atraumatic Resp: clear to auscultation bilaterally Cardio: regular rate and rhythm, S1, S2 normal, no murmur, click, rub or gallop GI: soft, non-tender; bowel sounds normal; no masses,  no organomegaly Extremities: extremities normal, atraumatic, no cyanosis or edema and left thigh AVG +T/B  Additional Objective Labs: Basic Metabolic Panel: Recent Labs  Lab 01/13/24 1258 01/15/24 0441  NA 138 133*  K 3.3* 4.1  CL 97* 94*  CO2 32 27  GLUCOSE 77 78  BUN 10 18  CREATININE 2.52* 5.31*  CALCIUM 8.5* 8.9  PHOS  --  3.2   Liver Function Tests: Recent Labs  Lab 01/13/24 1258 01/15/24 0441  AST 37  --   ALT 22  --   ALKPHOS 76  --   BILITOT <0.2  --   PROT 6.3*  --   ALBUMIN  3.9 3.8   No results for input(s): LIPASE, AMYLASE in the last 168 hours. CBC: Recent Labs  Lab 01/13/24 1258 01/13/24 2002 01/14/24 0438 01/15/24 0441  WBC 6.8  --  6.9 8.2  NEUTROABS 4.7  --   --   --   HGB 5.9* 7.8* 7.4* 8.3*  HCT 18.0* 23.2* 22.0* 25.7*  MCV 85.7  --  83.7 88.6  PLT 232  --  225 267   Blood Culture    Component Value Date/Time   SDES BLOOD RIGHT HAND 02/09/2008 1600   SPECREQUEST BOTTLES DRAWN AEROBIC ONLY 5CC 02/09/2008 1600   CULT NO GROWTH 5 DAYS 02/09/2008 1600   REPTSTATUS [id number]  FINAL 02/09/2008 1600    Cardiac Enzymes: No results for input(s): CKTOTAL, CKMB, CKMBINDEX, TROPONINI in the last 168 hours. CBG: No results for input(s): GLUCAP in the last 168 hours. Iron Studies:  Recent Labs    01/13/24 1258  IRON 45  TIBC 224*  FERRITIN 549*   @lablastinr3 @ Studies/Results: No results found. Medications:  sodium chloride  15 mL/hr at 01/14/24 1507   anticoagulant sodium citrate       ALPRAZolam   0.25 mg Oral BID   calcitRIOL   1.75 mcg Oral Q M,W,F-HD   Chlorhexidine  Gluconate Cloth  6 each Topical Q0600   hydroxychloroquine   200 mg Oral Daily   midodrine   10 mg Oral BID WC   multivitamin  1 tablet Oral Daily   pantoprazole  (PROTONIX ) IV  40 mg Intravenous Q12H   phenytoin   100 mg Oral BID   HD orders: [facility] MWF Last tx 9/22 3:30h, EDW 53kg, AVG L thigh 15g, 17H elisio BFR 400, DFR 500, 2/2.5 dialysate RN reports commonly has oozing around needle site Heparin  2500 loading + 1000 hourly Calictriol 1.75 three times per week      Assessment/Plan [name]  is an 48 y.o. female with ESRD on HD, HTN, SLE, DVT, seizure, mod intellectual disabilities, depression currently admitted for GIB and nephrology is consulted for co management of ESRD and associated conditions.    Assessment/Plan:  ABLA + h/o GIB + anemia of ESRD: Hb 5s s/p pRBC now with Hb in 7s.   PPI. GI consult - push enteroscopy +/- colonoscopy today given recurrent issues in the past few months.   ESA given outpt HD, not due yet.  Hold heparin  with HD.  ESRD -  HD today, K per protocol  Next Mon.  Hypertension/volume  - No evidence of edema or volume overload.  UF as bp tolerates.  Continue with midodrine  10 mg bid with HD   Metabolic bone disease -  Phos controlled w/o binders.  Corr ca ok.  On calcitriol  with HD.   Nutrition - renal diet  Hypokalemia:  K 4 this AM after po supplement yesterday, dialysate K 4 today.   Nephrology will not be planning to see  patient this weekend but if need arises or we can help with anything please don't hesitate to reach out to the provider listed on Amion.  If patient remains admitted Monday will plan next HD then and MD visit.   Erica Barters MD 01/15/2024, 8:46 AM  [facility] Pager: [phone]

## 2024-01-15 NOTE — Plan of Care (Signed)

## 2024-01-15 NOTE — Anesthesia Preprocedure Evaluation (Addendum)
 Anesthesia Evaluation  Patient identified by MRN, date of birth, ID band Patient awake    Reviewed: Allergy & Precautions, H&P , NPO status , Patient's Chart, lab work & pertinent test results  Airway Mallampati: II  TM Distance: >3 FB Neck ROM: Full    Dental no notable dental hx.    Pulmonary asthma , Current Smoker and Patient abstained from smoking.   Pulmonary exam normal breath sounds clear to auscultation       Cardiovascular hypertension, Normal cardiovascular exam Rhythm:Regular Rate:Normal     Neuro/Psych  Headaches, Seizures -,  PSYCHIATRIC DISORDERS Anxiety Depression     Neuromuscular disease    GI/Hepatic Neg liver ROS,GERD  ,,  Endo/Other  diabetes    Renal/GU ESRF and DialysisRenal disease  negative genitourinary   Musculoskeletal  (+) Arthritis ,    Abdominal   Peds negative pediatric ROS (+)  Hematology  (+) Blood dyscrasia, anemia   Anesthesia Other Findings Pt unable to proovide urine sample due to ESRD Denies being pregnant as she just finishd her period States she is willing to proceed without pregnancy test  Reproductive/Obstetrics negative OB ROS                              Anesthesia Physical Anesthesia Plan  ASA: 4  Anesthesia Plan: MAC   Post-op Pain Management:    Induction:   PONV Risk Score and Plan:   Airway Management Planned: Nasal Cannula  Additional Equipment:   Intra-op Plan:   Post-operative Plan:   Informed Consent: I have reviewed the patients History and Physical, chart, labs and discussed the procedure including the risks, benefits and alternatives for the proposed anesthesia with the patient or authorized representative who has indicated his/her understanding and acceptance.     Dental advisory given  Plan Discussed with: Surgeon  Anesthesia Plan Comments:          Anesthesia Quick Evaluation

## 2024-01-15 NOTE — Progress Notes (Signed)
 Patient completed bowel prep at 0400 has had tap water  enema and has clear returns at this time.

## 2024-01-15 NOTE — Brief Op Note (Signed)
 Patient underwent Enteroscopy and Colonoscopy  under propofol  sedation.  Tolerated the procedure adequately.   FINDINGS:  Enteroscopy   - Normal esophagus. - Normal stomach. - Normal duodenal bulb and second portion of the duodenum. - A single non- bleeding angioectasia in the jejunum. Treated with argon plasma coagulation ( APC) . - No specimens collected.  Colonoscopy   - A single non- bleeding colonic angioectasia. Treated with argon plasma coagulation ( APC) . - Two non- bleeding colonic angioectasias. Treated with argon plasma coagulation ( APC) . - Non- bleeding internal hemorrhoids. - The examined portion of the ileum was normal. _ hemoclip seen in the sigmoid colon - No specimens collected.   RECOMMENDATIONS  -Reconsider continued clinical need for antiplatelets; but no contraindication from GI perspective to continue  -restart diet  -may consider somatostatin as outpatient given multiple angiectasias and  transfusion requirements  -Follow up as outpatient in GI clinic - Spoke with patient legal guardian : Dewayne [phone]   Please recall Gi with any concerns ; sign off   Emmanuelle Hibbitts Faizan Keitra Carusone, MD Gastroenterology and Hepatology [facility]

## 2024-01-15 NOTE — Progress Notes (Addendum)
 PROGRESS NOTE  [name] FMW:[id number] DOB: 1975/06/09 DOA: 01/13/2024 PCP: Gammon, Chrystal, NP  Brief History:  48 year old female with a history of blood loss anemia with colonic AVMs, ESRD (MWF), diabetes mellitus type 2, lupus, neurological disability, seizure disorder presenting with abnormal labs showing hemoglobin 5.5.  The patient was sent to the emergency department from the dialysis unit. The patient is a poor historian.  However she is able to answer basic questions.  She denies any fever, chills, chest pain, shortness breath, abdominal pain, vomiting, diarrhea, hematochezia, melena.  Recent hospitalization 10/8 to 10/11, she was sent to the ED with hemoglobin of 5.9.  She received 2 units of blood, hemoglobin was 9 on discharge.  Colonoscopy done showed multiple AVMs, 1 actively bleeding.  Treated with coagulation.  Lesion in sigmoid colon oozing blood clip placed with hemostasis.   Was also hospitalized 9/22- 9/25, hemoglobin was down to 7, had endoscopy which showed gastritis, hemoglobin improved and she was discharged.  Colonoscopy 12/25/2023 --Nonbleeding internal hemorrhoids --Single recently bleeding colonic angiodysplastic lesion treated with APC --4 nonbleeding colonic angiodysplastic lesions treated with APC --1 single bleeding angiodysplastic lesion--Hemoclip placed --Instructions to resume Plavix  in 2 days after colonoscopy  12/08/2023 EGD --Normal esophagus, gastritis, normal duodenum   In the ED, the patient was afebrile and hemodynamically stable with oxygen saturation 99% room air.  WBC 6.8, hemoglobin 5.9, platelets 225.  1 unit PRBC was transfused.   Assessment/Plan:  Acute blood loss anemia -Hemoglobin down to 5.9 - Hemoglobin 9.8 at the time of last hospital discharge - Recent colonoscopy and EGD as discussed above - 1 unit PRBC given - Continue pantoprazole  twice daily - GI consult apprecated - Holding Plavix  - 01/15/24  colonoscopy--non-bleeding angioectasia ascending colon treated with APC;  two non-bleeding angioectasias cecum tx with APC; nonbleeding int hemorrhoids - 01/15/24 push enteroscopy--single nonbleeding angioectasia jejunum tx with APC   ESRD - Nephrology consulted for maintenance dialysis - She dialyzes Monday, Wednesday, Friday - Patient received dialysis 01/13/2024, 01/15/24   Diet-controlled diabetes mellitus type 2 - Monitor CBGs daily   Intellectual disability/seizure disorder - Resume phenytoin    Systemic lupus - Resume Plaquenil    Anxiety/depression - Resume sertraline  and Xanax  - PDMP reviewed - Xanax  0.25 mg, #60, last refill 12/18/2023               Family Communication:  no Family at bedside   Consultants:  renal, GI   Code Status:  FULL    DVT Prophylaxis:  SCD     Procedures: As Listed in Progress Note Above   Antibiotics: None        Subjective: Patient denies fevers, chills, headache, chest pain, dyspnea, nausea, vomiting, diarrhea, abdominal pain, dysuria, hematuria, hematochezia, and melena.   Objective: Vitals:   01/15/24 1415 01/15/24 1448 01/15/24 1602 01/15/24 1615  BP: (!) 107/59 (!) 115/57 101/75 93/69  Pulse: 77 73  80  Resp: 15 15 12 14   Temp: 98.8 F (37.1 C) 98.4 F (36.9 C) 97.7 F (36.5 C)   TempSrc: Oral Oral    SpO2: 98% 98% 100% 100%  Weight: 52.6 kg 52.6 kg    Height:  5' 2 (1.575 m)      Intake/Output Summary (Last 24 hours) at 01/15/2024 1631 Last data filed at 01/15/2024 1354 Gross per 24 hour  Intake 240 ml  Output 2000 ml  Net -1760 ml   Weight change:  Exam:  General:  Pt  is alert, follows commands appropriately, not in acute distress HEENT: No icterus, No thrush, No neck mass, [place]/AT Cardiovascular: RRR, S1/S2, no rubs, no gallops Respiratory: CTA bilaterally, no wheezing, no crackles, no rhonchi Abdomen: Soft/+BS, non tender, non distended, no guarding Extremities: No edema, No lymphangitis, No  petechiae, No rashes, no synovitis   Data Reviewed: I have personally reviewed following labs and imaging studies Basic Metabolic Panel: Recent Labs  Lab 01/13/24 1258 01/15/24 0441  NA 138 133*  K 3.3* 4.1  CL 97* 94*  CO2 32 27  GLUCOSE 77 78  BUN 10 18  CREATININE 2.52* 5.31*  CALCIUM 8.5* 8.9  PHOS  --  3.2   Liver Function Tests: Recent Labs  Lab 01/13/24 1258 01/15/24 0441  AST 37  --   ALT 22  --   ALKPHOS 76  --   BILITOT <0.2  --   PROT 6.3*  --   ALBUMIN  3.9 3.8   No results for input(s): LIPASE, AMYLASE in the last 168 hours. No results for input(s): AMMONIA in the last 168 hours. Coagulation Profile: No results for input(s): INR, PROTIME in the last 168 hours. CBC: Recent Labs  Lab 01/13/24 1258 01/13/24 2002 01/14/24 0438 01/15/24 0441  WBC 6.8  --  6.9 8.2  NEUTROABS 4.7  --   --   --   HGB 5.9* 7.8* 7.4* 8.3*  HCT 18.0* 23.2* 22.0* 25.7*  MCV 85.7  --  83.7 88.6  PLT 232  --  225 267   Cardiac Enzymes: No results for input(s): CKTOTAL, CKMB, CKMBINDEX, TROPONINI in the last 168 hours. BNP: Invalid input(s): POCBNP CBG: No results for input(s): GLUCAP in the last 168 hours. HbA1C: No results for input(s): HGBA1C in the last 72 hours. Urine analysis:    Component Value Date/Time   COLORURINE YELLOW 02/09/2008 1437   APPEARANCEUR TURBID (A) 02/09/2008 1437   LABSPEC 1.025 02/09/2008 1437   PHURINE 6.0 02/09/2008 1437   GLUCOSEU NEGATIVE 02/09/2008 1437   HGBUR LARGE (A) 02/09/2008 1437   BILIRUBINUR NEGATIVE 02/09/2008 1437   KETONESUR 15 (A) 02/09/2008 1437   PROTEINUR >300 (A) 02/09/2008 1437   UROBILINOGEN 0.2 02/09/2008 1437   NITRITE NEGATIVE 02/09/2008 1437   LEUKOCYTESUR LARGE (A) 02/09/2008 1437   Sepsis Labs: @LABRCNTIP (procalcitonin:4,lacticidven:4) )No results found for this or any previous visit (from the past 240 hours).   Scheduled Meds:  [MAR Hold] ALPRAZolam   0.25 mg Oral BID   [MAR  Hold] calcitRIOL   1.75 mcg Oral Q M,W,F-HD   [MAR Hold] Chlorhexidine  Gluconate Cloth  6 each Topical Q0600   [MAR Hold] hydroxychloroquine   200 mg Oral Daily   [MAR Hold] midodrine   10 mg Oral BID WC   [MAR Hold] multivitamin  1 tablet Oral Daily   [MAR Hold] pantoprazole  (PROTONIX ) IV  40 mg Intravenous Q12H   [MAR Hold] phenytoin   100 mg Oral BID   Continuous Infusions:  lactated ringers       Procedures/Studies: DG Chest Port 1 View Result Date: 12/23/2023 EXAM: 1 VIEW(S) XRAY OF THE CHEST 12/23/2023 10:40:00 AM COMPARISON: 12/10/2023 CLINICAL HISTORY: weakness. Pt BIB [facility] from dialysis for c/o low hemoglobin; dialysis states pt's hemoglobin is 5.9; pt was seen here last week for same complaint and received blood; pt is unsure how many; ; Pt denies any sob or pain. Hx of DVT, hypertension. Current everyday smoker FINDINGS: LUNGS AND PLEURA: Interval increase in right pleural effusion which is now moderate in volume compared to small. No focal  pulmonary opacity. No pulmonary edema. No pneumothorax. HEART AND MEDIASTINUM: Vascular stents noted. No acute abnormality of the cardiac and mediastinal silhouettes. BONES AND SOFT TISSUES: No acute osseous abnormality. IMPRESSION: 1. Interval increase in right pleural effusion, now moderate in volume compared to small. 2. No focal pulmonary opacity, pulmonary edema, or pneumothorax. Electronically signed by: Norleen Boxer MD 12/23/2023 11:12 AM EDT RP Workstation: KIERAN Alm Schneider, DO  [facility]  If 7PM-7AM, please contact night-coverage [email or web address] Password [email or web address] 01/15/2024, 4:31 PM   LOS: 0 days

## 2024-01-15 NOTE — Anesthesia Postprocedure Evaluation (Signed)
 Anesthesia Post Note  Patient: [name]  Procedure(s) Performed: COLONOSCOPY ENTEROSCOPY  Patient location during evaluation: PACU Anesthesia Type: MAC Level of consciousness: awake and alert Pain management: pain level controlled Vital Signs Assessment: post-procedure vital signs reviewed and stable Respiratory status: spontaneous breathing, nonlabored ventilation, respiratory function stable and patient connected to nasal cannula oxygen Cardiovascular status: stable and blood pressure returned to baseline Postop Assessment: no apparent nausea or vomiting Anesthetic complications: no   No notable events documented.   Last Vitals:  Vitals:   01/15/24 1415 01/15/24 1448  BP: (!) 107/59 (!) 115/57  Pulse: 77 73  Resp: 15 15  Temp: 37.1 C 36.9 C  SpO2: 98% 98%    Last Pain:  Vitals:   01/15/24 1500  TempSrc:   PainSc: 0-No pain                 Andrea Limes

## 2024-01-15 NOTE — Plan of Care (Signed)

## 2024-01-16 DIAGNOSIS — N186 End stage renal disease: Secondary | ICD-10-CM | POA: Diagnosis not present

## 2024-01-16 DIAGNOSIS — D62 Acute posthemorrhagic anemia: Secondary | ICD-10-CM | POA: Diagnosis not present

## 2024-01-16 DIAGNOSIS — D649 Anemia, unspecified: Secondary | ICD-10-CM | POA: Diagnosis not present

## 2024-01-16 DIAGNOSIS — K5520 Angiodysplasia of colon without hemorrhage: Secondary | ICD-10-CM | POA: Diagnosis not present

## 2024-01-16 DIAGNOSIS — G40909 Epilepsy, unspecified, not intractable, without status epilepticus: Secondary | ICD-10-CM | POA: Diagnosis not present

## 2024-01-16 LAB — CBC
HCT: 25 % — ABNORMAL LOW (ref 36.0–46.0)
Hemoglobin: 8.2 g/dL — ABNORMAL LOW (ref 12.0–15.0)
MCH: 28.9 pg (ref 26.0–34.0)
MCHC: 32.8 g/dL (ref 30.0–36.0)
MCV: 88 fL (ref 80.0–100.0)
Platelets: 263 K/uL (ref 150–400)
RBC: 2.84 MIL/uL — ABNORMAL LOW (ref 3.87–5.11)
RDW: 15.6 % — ABNORMAL HIGH (ref 11.5–15.5)
WBC: 7.4 K/uL (ref 4.0–10.5)
nRBC: 0 % (ref 0.0–0.2)

## 2024-01-16 MED ORDER — CLOPIDOGREL BISULFATE 75 MG PO TABS: 75.0000 mg | ORAL_TABLET | Freq: Every day | ORAL | Status: AC

## 2024-01-16 NOTE — NC FL2 (Signed)
 [place]  MEDICAID FL2 LEVEL OF CARE FORM     IDENTIFICATION  Patient Name: [name] Birthdate: 03/26/1975 Sex: female Admission Date (Current Location): 01/13/2024  [facility] and [place] Number:  [employer] and Address:  [facility],  [address], [place] [address]      Provider Number: [id number]  Attending Physician Name and Address:  No att. providers found  Relative Name and Phone Number:  Dewayne Clarity (Legal Guardian)  [phone] (Mobile)    Current Level of Care: Hospital Recommended Level of Care: [facility] Prior Approval Number:    Date Approved/Denied:   PASRR Number:    Discharge Plan: Domiciliary (Rest home) (Group Home)    Current Diagnoses: Patient Active Problem List   Diagnosis Date Noted   Angiectasia of large intestine 01/15/2024   Symptomatic anemia 01/15/2024   Angiectasia of gastrointestinal tract 01/15/2024   AVM (arteriovenous malformation) of colon with hemorrhage 12/26/2023   Chronic kidney disease 12/24/2023   Acute on chronic anemia 12/23/2023   Hypokalemia 12/23/2023   Hypophosphatemia 12/23/2023   Acute GI bleeding 12/08/2023   Upper GI bleed 12/07/2023   Acute anemia 12/07/2023   Encounter for screening fecal occult blood testing 07/01/2022   Encounter for gynecological examination with Papanicolaou smear of cervix 07/01/2022   Hemorrhage from arteriovenous dialysis graft 02/08/2022   Chronic hypotension 02/08/2022   Diabetes (HCC) 11/02/2018   Lipoma of extremity    Hyperkalemia 10/23/2016   Complication of vascular access for dialysis 04/01/2016   Seizure disorder (HCC) 10/01/2015   Generalized muscle weakness 10/01/2015   Vaginal discharge 11/08/2013   Vaginal odor 11/08/2013   BV (bacterial vaginosis) 11/08/2013   ESRD on dialysis (HCC) 12/27/2010   Anemia in chronic kidney disease 12/27/2010   Major depressive disorder with single episode 12/27/2010   Secondary  hyperparathyroidism of renal origin 12/27/2010   Systemic lupus erythematosus, organ or system involvement unspecified (HCC) 12/27/2010   Type 2 diabetes mellitus without complication 12/27/2010   SWELLING, NECK 04/02/2007   Asthma 03/15/2007   FOOT PAIN, RIGHT 03/15/2007   Cough 03/15/2007   MENTAL RETARDATION, MODERATE 01/19/2007   STATUS, MENTAL, ALTERED 07/16/2006   ANOREXIA 05/26/2006   Disease of pericardium 05/15/2006   Tachycardia 05/15/2006   Lupus (systemic lupus erythematosus) (HCC) 04/01/2006   ANEMIA-NOS 03/31/2006   LEUKOCYTOSIS 03/31/2006   Depression with anxiety 03/31/2006   Migraine headache 03/31/2006   Hereditary and idiopathic peripheral neuropathy 03/31/2006   Essential (primary) hypertension 03/31/2006   GERD 03/31/2006   Osteoarthritis 03/31/2006   Other specified abnormal findings of blood chemistry 03/31/2006    Orientation RESPIRATION BLADDER Height & Weight     Self, Time, Situation, Place  Normal Continent Weight: 52.6 kg Height:  5' 2 (157.5 cm)  BEHAVIORAL SYMPTOMS/MOOD NEUROLOGICAL BOWEL NUTRITION STATUS      Continent  (Renal Diet)  AMBULATORY STATUS COMMUNICATION OF NEEDS Skin   Supervision Verbally Normal                       Personal Care Assistance Level of Assistance  Bathing, Feeding, Dressing Bathing Assistance: Limited assistance Feeding assistance: Limited assistance Dressing Assistance: Limited assistance     Functional Limitations Info  Sight, Hearing, Speech Sight Info: Impaired Hearing Info: Adequate Speech Info: Adequate    SPECIAL CARE FACTORS FREQUENCY                       Contractures Contractures Info: Not  present    Additional Factors Info  Code Status, Allergies Code Status Info: Full Allergies Info: Dust mite extract Psychotropic Info: Zoloft          Current Medications (01/16/2024):  This is the current hospital active medication list Current Facility-Administered Medications   Medication Dose Route Frequency Provider Last Rate Last Admin   acetaminophen  (TYLENOL ) tablet 650 mg  650 mg Oral Q6H PRN Emokpae, Ejiroghene E, MD       Or   acetaminophen  (TYLENOL ) suppository 650 mg  650 mg Rectal Q6H PRN Emokpae, Ejiroghene E, MD       ALPRAZolam  (XANAX ) tablet 0.25 mg  0.25 mg Oral BID Tat, David, MD   0.25 mg at 01/16/24 9093   calcitRIOL  (ROCALTROL ) capsule 1.75 mcg  1.75 mcg Oral Q M,W,F-HD Norine Manuelita LABOR, MD       Chlorhexidine  Gluconate Cloth 2 % PADS 6 each  6 each Topical Q0600 Rayburn Pac, MD   6 each at 01/16/24 0530   hydroxychloroquine  (PLAQUENIL ) tablet 200 mg  200 mg Oral Daily Tat, Alm, MD   200 mg at 01/16/24 9093   midodrine  (PROAMATINE ) tablet 10 mg  10 mg Oral BID WC Emokpae, Ejiroghene E, MD   10 mg at 01/16/24 9093   multivitamin (RENA-VIT) tablet 1 tablet  1 tablet Oral Daily Emokpae, Ejiroghene E, MD   1 tablet at 01/16/24 0906   ondansetron  (ZOFRAN ) tablet 4 mg  4 mg Oral Q6H PRN Emokpae, Ejiroghene E, MD       Or   ondansetron  (ZOFRAN ) injection 4 mg  4 mg Intravenous Q6H PRN Emokpae, Ejiroghene E, MD   4 mg at 01/14/24 2054   pantoprazole  (PROTONIX ) injection 40 mg  40 mg Intravenous Q12H Emokpae, Ejiroghene E, MD   40 mg at 01/16/24 9094   phenytoin  (DILANTIN ) ER capsule 100 mg  100 mg Oral BID Emokpae, Ejiroghene E, MD   100 mg at 01/16/24 0906   polyethylene glycol (MIRALAX  / GLYCOLAX ) packet 17 g  17 g Oral Daily PRN Emokpae, Ejiroghene E, MD       sertraline  (ZOLOFT ) tablet 100 mg  100 mg Oral QHS Tat, David, MD   100 mg at 01/15/24 2118   Current Outpatient Medications  Medication Sig Dispense Refill   albuterol  (PROVENTIL ) (2.5 MG/3ML) 0.083% nebulizer solution Take 2.5 mg by nebulization 4 (four) times daily as needed for wheezing or shortness of breath.      ALPRAZolam  (XANAX ) 0.25 MG tablet Take 0.25 mg by mouth 2 (two) times daily.     ammonium lactate  (AMLACTIN DAILY) 12 % lotion Apply 1 Application topically as needed  for dry skin. 400 g 0   aspirin EC 81 MG tablet Take 81 mg by mouth daily. Swallow whole.     calcium carbonate (TUMS - DOSED IN MG ELEMENTAL CALCIUM) 500 MG chewable tablet Chew 1,000 mg by mouth 2 (two) times daily.     chlorhexidine  (PERIDEX ) 0.12 % solution Use as directed 15 mLs in the mouth or throat 2 (two) times daily. After regular toothbrushing, rinse mouth     cycloSPORINE (RESTASIS) 0.05 % ophthalmic emulsion Place 1 drop into both eyes 2 (two) times daily.     docusate sodium  (COLACE) 100 MG capsule Take 100 mg by mouth daily.     guaiFENesin  (GERI-TUSSIN) 100 MG/5ML liquid Take 200 mg by mouth every 6 (six) hours as needed for cough.     hydroxychloroquine  (PLAQUENIL ) 200 MG tablet Take 200 mg by mouth  daily.     midodrine  (PROAMATINE ) 10 MG tablet Take 10 mg by mouth 2 (two) times daily.      mirtazapine  (REMERON ) 15 MG tablet Take 7.5 mg by mouth at bedtime.      multivitamin (RENA-VIT) TABS tablet Take 1 tablet by mouth daily.     pantoprazole  (PROTONIX ) 40 MG tablet Take 40 mg by mouth daily.     phenytoin  (DILANTIN ) 100 MG ER capsule Take 100 mg by mouth 2 (two) times daily.      sertraline  (ZOLOFT ) 100 MG tablet Take 200 mg by mouth daily.      simvastatin  (ZOCOR ) 40 MG tablet Take 40 mg by mouth at bedtime.     clopidogrel  (PLAVIX ) 75 MG tablet Take 1 tablet (75 mg total) by mouth daily. Restart on 01/18/2024     epoetin  alfa (EPOGEN ,PROCRIT) 4000 UNIT/ML injection Inject 4,000 Units into the vein See admin instructions. Mon, Wed, and Friday at dialysis       Discharge Medications: Allergies as of 01/16/2024       Reactions   Dust Mite Extract Other (See Comments)   Sneezing- allergy not listed on MAR         Medication List     TAKE these medications    albuterol  (2.5 MG/3ML) 0.083% nebulizer solution Commonly known as: PROVENTIL  Take 2.5 mg by nebulization 4 (four) times daily as needed for wheezing or shortness of breath.   ALPRAZolam  0.25 MG  tablet Commonly known as: XANAX  Take 0.25 mg by mouth 2 (two) times daily.   ammonium lactate  12 % lotion Commonly known as: Amlactin Daily Apply 1 Application topically as needed for dry skin.   aspirin EC 81 MG tablet Take 81 mg by mouth daily. Swallow whole.   calcium carbonate 500 MG chewable tablet Commonly known as: TUMS - dosed in mg elemental calcium Chew 1,000 mg by mouth 2 (two) times daily.   chlorhexidine  0.12 % solution Commonly known as: PERIDEX  Use as directed 15 mLs in the mouth or throat 2 (two) times daily. After regular toothbrushing, rinse mouth   clopidogrel  75 MG tablet Commonly known as: PLAVIX  Take 1 tablet (75 mg total) by mouth daily. Restart on 01/18/2024 What changed: additional instructions   cycloSPORINE 0.05 % ophthalmic emulsion Commonly known as: RESTASIS Place 1 drop into both eyes 2 (two) times daily.   docusate sodium  100 MG capsule Commonly known as: COLACE Take 100 mg by mouth daily.   epoetin  alfa 4000 UNIT/ML injection Commonly known as: EPOGEN  Inject 4,000 Units into the vein See admin instructions. Mon, Wed, and Friday at dialysis   Geri-Tussin 100 MG/5ML liquid Generic drug: guaiFENesin  Take 200 mg by mouth every 6 (six) hours as needed for cough.   hydroxychloroquine  200 MG tablet Commonly known as: PLAQUENIL  Take 200 mg by mouth daily.   midodrine  10 MG tablet Commonly known as: PROAMATINE  Take 10 mg by mouth 2 (two) times daily.   mirtazapine  15 MG tablet Commonly known as: REMERON  Take 7.5 mg by mouth at bedtime.   multivitamin Tabs tablet Take 1 tablet by mouth daily.   pantoprazole  40 MG tablet Commonly known as: PROTONIX  Take 40 mg by mouth daily.   phenytoin  100 MG ER capsule Commonly known as: DILANTIN  Take 100 mg by mouth 2 (two) times daily.   sertraline  100 MG tablet Commonly known as: ZOLOFT  Take 200 mg by mouth daily.   simvastatin  40 MG tablet Commonly known as: ZOCOR  Take 40 mg by mouth  at  bedtime.         Relevant Imaging Results:  Relevant Lab Results:   Additional Information SSN: [id number]  Sharlyne Stabs, RN

## 2024-01-16 NOTE — Discharge Summary (Signed)
 Physician Discharge Summary   Patient: [name] MRN: [id number] DOB: October 10, 1975  Admit date:     01/13/2024  Discharge date: 01/16/24  Discharge Physician: Alm Lovinia Snare   PCP: Gammon, Chrystal, NP   Recommendations at discharge:   Please follow up with primary care provider within 1-2 weeks  Please repeat CBC in one week      Hospital Course: 48 year old female with a history of blood loss anemia with colonic AVMs, ESRD (MWF), diabetes mellitus type 2, lupus, neurological disability, seizure disorder presenting with abnormal labs showing hemoglobin 5.5.  The patient was sent to the emergency department from the dialysis unit. The patient is a poor historian.  However she is able to answer basic questions.  She denies any fever, chills, chest pain, shortness breath, abdominal pain, vomiting, diarrhea, hematochezia, melena.  Recent hospitalization 10/8 to 10/11, she was sent to the ED with hemoglobin of 5.9.  She received 2 units of blood, hemoglobin was 9 on discharge.  Colonoscopy done showed multiple AVMs, 1 actively bleeding.  Treated with coagulation.  Lesion in sigmoid colon oozing blood clip placed with hemostasis.   Was also hospitalized 9/22- 9/25, hemoglobin was down to 7, had endoscopy which showed gastritis, hemoglobin improved and she was discharged.  Colonoscopy 12/25/2023 --Nonbleeding internal hemorrhoids --Single recently bleeding colonic angiodysplastic lesion treated with APC --4 nonbleeding colonic angiodysplastic lesions treated with APC --1 single bleeding angiodysplastic lesion--Hemoclip placed --Instructions to resume Plavix  in 2 days after colonoscopy  12/08/2023 EGD --Normal esophagus, gastritis, normal duodenum   In the ED, the patient was afebrile and hemodynamically stable with oxygen saturation 99% room air.  WBC 6.8, hemoglobin 5.9, platelets 225.  1 unit PRBC was transfused.  Assessment and Plan: Acute blood loss anemia -Hemoglobin  down to 5.9 - Hemoglobin 9.8 at the time of last hospital discharge - Recent colonoscopy and EGD as discussed above - 1 unit PRBC given - Continue pantoprazole  twice daily - GI consult apprecated - Holding Plavix --restart plavix  on 01/18/2024 - 01/15/24 colonoscopy--non-bleeding angioectasia ascending colon treated with APC;  two non-bleeding angioectasias cecum tx with APC; nonbleeding int hemorrhoids - 01/15/24 push enteroscopy--single nonbleeding angioectasia jejunum tx with APC - renal diet started after endoscopy and pt tolerated - Hgb remained stable without any signs of active blood loss - Hemoccult negative during this admission - iron saturation 20%, ferritin 549, folate >20, B12--1207   ESRD - Nephrology consulted for maintenance dialysis - She dialyzes Monday, Wednesday, Friday - Patient received dialysis 01/13/2024, 01/15/24   Diet-controlled diabetes mellitus type 2 - Monitor CBGs daily   Intellectual disability/seizure disorder - Resume phenytoin    Systemic lupus - Resume Plaquenil    Anxiety/depression - Resume sertraline  and Xanax  - PDMP reviewed - Xanax  0.25 mg, #60, last refill 12/18/2023         Consultants: renal Procedures performed: colonoscopy, push enteroscopy  Disposition: Assisted living--[facility] Diet recommendation:  Renal diet DISCHARGE MEDICATION: Allergies as of 01/16/2024       Reactions   Dust Mite Extract Other (See Comments)   Sneezing- allergy not listed on MAR         Medication List     TAKE these medications    albuterol  (2.5 MG/3ML) 0.083% nebulizer solution Commonly known as: PROVENTIL  Take 2.5 mg by nebulization 4 (four) times daily as needed for wheezing or shortness of breath.   ALPRAZolam  0.25 MG tablet Commonly known as: XANAX  Take 0.25 mg by mouth 2 (two) times daily.   ammonium lactate  12 %  lotion Commonly known as: Amlactin Daily Apply 1 Application topically as needed for dry skin.   aspirin EC 81 MG  tablet Take 81 mg by mouth daily. Swallow whole.   calcium carbonate 500 MG chewable tablet Commonly known as: TUMS - dosed in mg elemental calcium Chew 1,000 mg by mouth 2 (two) times daily.   chlorhexidine  0.12 % solution Commonly known as: PERIDEX  Use as directed 15 mLs in the mouth or throat 2 (two) times daily. After regular toothbrushing, rinse mouth   clopidogrel  75 MG tablet Commonly known as: PLAVIX  Take 1 tablet (75 mg total) by mouth daily. Restart on 01/18/2024 What changed: additional instructions   cycloSPORINE 0.05 % ophthalmic emulsion Commonly known as: RESTASIS Place 1 drop into both eyes 2 (two) times daily.   docusate sodium  100 MG capsule Commonly known as: COLACE Take 100 mg by mouth daily.   epoetin  alfa 4000 UNIT/ML injection Commonly known as: EPOGEN  Inject 4,000 Units into the vein See admin instructions. Mon, Wed, and Friday at dialysis   Geri-Tussin 100 MG/5ML liquid Generic drug: guaiFENesin  Take 200 mg by mouth every 6 (six) hours as needed for cough.   hydroxychloroquine  200 MG tablet Commonly known as: PLAQUENIL  Take 200 mg by mouth daily.   midodrine  10 MG tablet Commonly known as: PROAMATINE  Take 10 mg by mouth 2 (two) times daily.   mirtazapine  15 MG tablet Commonly known as: REMERON  Take 7.5 mg by mouth at bedtime.   multivitamin Tabs tablet Take 1 tablet by mouth daily.   pantoprazole  40 MG tablet Commonly known as: PROTONIX  Take 40 mg by mouth daily.   phenytoin  100 MG ER capsule Commonly known as: DILANTIN  Take 100 mg by mouth 2 (two) times daily.   sertraline  100 MG tablet Commonly known as: ZOLOFT  Take 200 mg by mouth daily.   simvastatin  40 MG tablet Commonly known as: ZOCOR  Take 40 mg by mouth at bedtime.        Discharge Exam: Filed Weights   01/15/24 9044 01/15/24 1415 01/15/24 1448  Weight: 54.7 kg 52.6 kg 52.6 kg   HEENT:  [place]/AT, No thrush, no icterus CV:  RRR, no rub, no S3, no S4 Lung:  CTA, no  wheeze, no rhonchi Abd:  soft/+BS, NT Ext:  No edema, no lymphangitis, no synovitis, no rash   Condition at discharge: stable  The results of significant diagnostics from this hospitalization (including imaging, microbiology, ancillary and laboratory) are listed below for reference.   Imaging Studies: DG Chest Port 1 View Result Date: 12/23/2023 EXAM: 1 VIEW(S) XRAY OF THE CHEST 12/23/2023 10:40:00 AM COMPARISON: 12/10/2023 CLINICAL HISTORY: weakness. Pt BIB [facility] from dialysis for c/o low hemoglobin; dialysis states pt's hemoglobin is 5.9; pt was seen here last week for same complaint and received blood; pt is unsure how many; ; Pt denies any sob or pain. Hx of DVT, hypertension. Current everyday smoker FINDINGS: LUNGS AND PLEURA: Interval increase in right pleural effusion which is now moderate in volume compared to small. No focal pulmonary opacity. No pulmonary edema. No pneumothorax. HEART AND MEDIASTINUM: Vascular stents noted. No acute abnormality of the cardiac and mediastinal silhouettes. BONES AND SOFT TISSUES: No acute osseous abnormality. IMPRESSION: 1. Interval increase in right pleural effusion, now moderate in volume compared to small. 2. No focal pulmonary opacity, pulmonary edema, or pneumothorax. Electronically signed by: Norleen Boxer MD 12/23/2023 11:12 AM EDT RP Workstation: [id number]    Microbiology: Results for orders placed or performed during the hospital encounter of  12/23/23  MRSA Next Gen by PCR, Nasal     Status: None   Collection Time: 12/24/23  5:28 AM   Specimen: Nasal Mucosa; Nasal Swab  Result Value Ref Range Status   MRSA by PCR Next Gen NOT DETECTED NOT DETECTED Final    Comment: (NOTE) The GeneXpert MRSA Assay (FDA approved for NASAL specimens only), is one component of a comprehensive MRSA colonization surveillance program. It is not intended to diagnose MRSA infection nor to guide or monitor treatment for MRSA infections. Test performance is not FDA  approved in patients less than 31 years old. Performed at [facility], [address], [place], [place] [address]     Labs: CBC: Recent Labs  Lab 01/13/24 1258 01/13/24 2002 01/14/24 0438 01/15/24 0441 01/16/24 0331  WBC 6.8  --  6.9 8.2 7.4  NEUTROABS 4.7  --   --   --   --   HGB 5.9* 7.8* 7.4* 8.3* 8.2*  HCT 18.0* 23.2* 22.0* 25.7* 25.0*  MCV 85.7  --  83.7 88.6 88.0  PLT 232  --  225 267 263   Basic Metabolic Panel: Recent Labs  Lab 01/13/24 1258 01/15/24 0441  NA 138 133*  K 3.3* 4.1  CL 97* 94*  CO2 32 27  GLUCOSE 77 78  BUN 10 18  CREATININE 2.52* 5.31*  CALCIUM 8.5* 8.9  PHOS  --  3.2   Liver Function Tests: Recent Labs  Lab 01/13/24 1258 01/15/24 0441  AST 37  --   ALT 22  --   ALKPHOS 76  --   BILITOT <0.2  --   PROT 6.3*  --   ALBUMIN  3.9 3.8   CBG: No results for input(s): GLUCAP in the last 168 hours.  Discharge time spent: greater than 30 minutes.  Signed: Alm Schneider, MD [facility] 01/16/2024

## 2024-01-16 NOTE — Plan of Care (Signed)
  Problem: Education: Goal: Knowledge of General Education information will improve Description: Including pain rating scale, medication(s)/side effects and non-pharmacologic comfort measures Outcome: Progressing   Problem: Health Behavior/Discharge Planning: Goal: Ability to manage health-related needs will improve Outcome: Progressing   Problem: Clinical Measurements: Goal: Ability to maintain clinical measurements within normal limits will improve Outcome: Progressing Goal: Will remain free from infection Outcome: Progressing Goal: Diagnostic test results will improve Outcome: Progressing Goal: Respiratory complications will improve Outcome: Progressing Goal: Cardiovascular complication will be avoided Outcome: Progressing   Problem: Activity: Goal: Risk for activity intolerance will decrease Outcome: Progressing   Problem: Elimination: Goal: Will not experience complications related to bowel motility Outcome: Progressing Goal: Will not experience complications related to urinary retention Outcome: Progressing   Problem: Pain Managment: Goal: General experience of comfort will improve and/or be controlled Outcome: Progressing   Problem: Safety: Goal: Ability to remain free from injury will improve Outcome: Progressing   Problem: Skin Integrity: Goal: Risk for impaired skin integrity will decrease Outcome: Progressing

## 2024-01-16 NOTE — Plan of Care (Signed)
   Problem: Education: Goal: Knowledge of General Education information will improve Description Including pain rating scale, medication(s)/side effects and non-pharmacologic comfort measures Outcome: Progressing   Problem: Health Behavior/Discharge Planning: Goal: Ability to manage health-related needs will improve Outcome: Progressing

## 2024-01-16 NOTE — TOC Transition Note (Addendum)
 Transition of Care [facility]) - Discharge Note   Patient Details  Name: [name] MRN: [id number] Date of Birth: 04-12-1975  [facility] CM/SW Contact:  Sharlyne Stabs, RN Phone Number: 01/16/2024, 10:58 AM   Clinical Narrative:   Medically ready to discharge home. CM called [facility] they will send a driver. RN printing paper work and getting her ready for transportation.   FL2 faxed to [facility]   Patient Goals and CMS Choice               Patient to be transferred to facility by: [facility] hands staff   Patient and family notified of of transfer: 01/16/24  Discharge Plan and Services Additional resources added to the After Visit Summary for            Social Drivers of Health (SDOH) Interventions SDOH Screenings   Food Insecurity: No Food Insecurity (01/13/2024)  Housing: Low Risk  (01/13/2024)  Transportation Needs: No Transportation Needs (01/13/2024)  Utilities: Not At Risk (01/13/2024)  Alcohol  Screen: Low Risk  (07/01/2022)  Depression (PHQ2-9): Low Risk  (07/01/2022)  Financial Resource Strain: Low Risk  (07/01/2022)  Physical Activity: Inactive (07/01/2022)  Social Connections: Moderately Isolated (12/23/2023)  Stress: No Stress Concern Present (07/01/2022)  Tobacco Use: High Risk (01/15/2024)     Readmission Risk Interventions    12/24/2023   12:18 PM 12/10/2023    1:07 PM  Readmission Risk Prevention Plan  Transportation Screening Complete Complete  PCP or Specialist Appt within 5-7 Days  Complete  Home Care Screening Complete Complete  Medication Review (RN CM) Complete Complete

## 2024-01-18 ENCOUNTER — Encounter (HOSPITAL_COMMUNITY): Payer: Self-pay | Admitting: Gastroenterology

## 2024-01-18 NOTE — Progress Notes (Signed)
 Late note entry 11/3 0902  Noted that pt d/c over weekend. Contacted out-pt HD clinic, [facility], to inform of pt d/c and that pt should have returned this morning, they stated she did. D/c summary and last note faxed over at this time. No further support needed.   Lavanda Amera [facility] Dialysis navigator [phone]

## 2024-01-21 ENCOUNTER — Encounter (INDEPENDENT_AMBULATORY_CARE_PROVIDER_SITE_OTHER): Payer: Self-pay | Admitting: Gastroenterology

## 2024-01-22 ENCOUNTER — Encounter (HOSPITAL_COMMUNITY)
Admission: RE | Disposition: A | Discharge status: Home / Self Care | Payer: Self-pay | Source: Home / Self Care | Attending: Surgery

## 2024-01-22 ENCOUNTER — Ambulatory Visit (HOSPITAL_COMMUNITY)
Admission: RE | Admit: 2024-01-22 | Discharge: 2024-01-22 | Disposition: A | Discharge status: Home / Self Care | Attending: Surgery | Admitting: Surgery

## 2024-01-22 ENCOUNTER — Other Ambulatory Visit: Payer: Self-pay

## 2024-01-22 DIAGNOSIS — I871 Compression of vein: Secondary | ICD-10-CM | POA: Diagnosis not present

## 2024-01-22 DIAGNOSIS — I120 Hypertensive chronic kidney disease with stage 5 chronic kidney disease or end stage renal disease: Secondary | ICD-10-CM | POA: Diagnosis not present

## 2024-01-22 DIAGNOSIS — N186 End stage renal disease: Secondary | ICD-10-CM | POA: Insufficient documentation

## 2024-01-22 DIAGNOSIS — Z992 Dependence on renal dialysis: Secondary | ICD-10-CM | POA: Diagnosis not present

## 2024-01-22 DIAGNOSIS — Y832 Surgical operation with anastomosis, bypass or graft as the cause of abnormal reaction of the patient, or of later complication, without mention of misadventure at the time of the procedure: Secondary | ICD-10-CM | POA: Insufficient documentation

## 2024-01-22 DIAGNOSIS — T82858A Stenosis of vascular prosthetic devices, implants and grafts, initial encounter: Secondary | ICD-10-CM | POA: Diagnosis present

## 2024-01-22 DIAGNOSIS — F17210 Nicotine dependence, cigarettes, uncomplicated: Secondary | ICD-10-CM | POA: Diagnosis not present

## 2024-01-22 HISTORY — PX: VENOUS ANGIOPLASTY: CATH118376

## 2024-01-22 HISTORY — PX: A/V FISTULAGRAM: CATH118298

## 2024-01-22 SURGERY — A/V FISTULAGRAM
Anesthesia: LOCAL | Site: Thigh | Laterality: Left

## 2024-01-22 MED ORDER — IODIXANOL 320 MG/ML IV SOLN
INTRAVENOUS | Status: DC | PRN
  Administered 2024-01-22: 25 mL

## 2024-01-22 MED ORDER — HEPARIN (PORCINE) IN NACL 1000-0.9 UT/500ML-% IV SOLN
INTRAVENOUS | Status: DC | PRN
  Administered 2024-01-22: 500 mL

## 2024-01-22 MED ORDER — LIDOCAINE HCL (PF) 1 % IJ SOLN
INTRAMUSCULAR | Status: DC | PRN
  Administered 2024-01-22: 5 mL via INTRADERMAL

## 2024-01-22 MED ORDER — LIDOCAINE HCL (PF) 1 % IJ SOLN
INTRAMUSCULAR | Status: AC
  Filled 2024-01-22: qty 30

## 2024-01-22 SURGICAL SUPPLY — 9 items
BALLOON MUSTANG 9X60X75 (BALLOONS) IMPLANT
DEVICE INFLATION ENCORE 26 (MISCELLANEOUS) IMPLANT
KIT MICROPUNCTURE NIT STIFF (SHEATH) IMPLANT
KIT PV (KITS) ×3 IMPLANT
SHEATH PINNACLE R/O II 6F 4CM (SHEATH) IMPLANT
SHEATH PROBE COVER 6X72 (BAG) IMPLANT
TRAY PV CATH (CUSTOM PROCEDURE TRAY) ×3 IMPLANT
TUBING CIL FLEX 10 FLL-RA (TUBING) IMPLANT
WIRE BENTSON .035X145CM (WIRE) IMPLANT

## 2024-01-22 NOTE — H&P (Signed)
 [facility] [place]  Patient name: [name] MRN: [id number] DOB: Jul 29, 1975 Sex: female   REASON FOR VISIT:    Access for HD pissues  HISOTRY OF PRESENT ILLNESS:    [name] is a 48 y.o. female who is status post placement of a left femoral loop graft on 09/25/2022.  The anastomosis was to the common femoral artery and common femoral vein.  She required balloon venoplasty at the venous outflow tract on 09/15/2023.  She was recently discharged from the hospital for anemia.  She was found to have multiple AVMs on colonoscopy treated with coagulation   PAST MEDICAL HISTORY:   Past Medical History:  Diagnosis Date   Anemia    BV (bacterial vaginosis) 11/08/2013   Depression    Dialysis patient    DVT (deep venous thrombosis) (HCC)    Hypertension    Lupus    Renal disorder    Vaginal odor 11/08/2013     FAMILY HISTORY:   Family History  Problem Relation Age of Onset   Seizures Son    Kidney disease Mother        on dialysis   Lupus Mother     SOCIAL HISTORY:   Social History   Tobacco Use   Smoking status: Every Day    Current packs/day: 0.10    Average packs/day: 0.1 packs/day for 15.0 years (1.5 ttl pk-yrs)    Types: Cigarettes    Passive exposure: Current   Smokeless tobacco: Never  Substance Use Topics   Alcohol  use: No     ALLERGIES:   Allergies  Allergen Reactions   Dust Mite Extract Other (See Comments)    Sneezing- allergy not listed on MAR      CURRENT MEDICATIONS:   No current facility-administered medications for this encounter.    REVIEW OF SYSTEMS:   [X]  denotes positive finding, [ ]  denotes negative finding Cardiac  Comments:  Chest pain or chest pressure:    Shortness of breath upon exertion:    Short of breath when lying flat:    Irregular heart rhythm:        Vascular    Pain in calf, thigh, or hip brought on by ambulation:    Pain in  feet at night that wakes you up from your sleep:     Blood clot in your veins:    Leg swelling:         Pulmonary    Oxygen at home:    Productive cough:     Wheezing:         Neurologic    Sudden weakness in arms or legs:     Sudden numbness in arms or legs:     Sudden onset of difficulty speaking or slurred speech:    Temporary loss of vision in one eye:     Problems with dizziness:         Gastrointestinal    Blood in stool:     Vomited blood:         Genitourinary    Burning when urinating:     Blood in urine:        Psychiatric    Major depression:         Hematologic    Bleeding problems:    Problems with blood clotting too easily:        Skin    Rashes or ulcers:        Constitutional    Fever  or chills:      PHYSICAL EXAM:   Vitals:   01/22/24 0726 01/22/24 0731  BP: 124/65 124/65  Pulse: 76 68  Resp: 12 14  Temp: 97.7 F (36.5 C)   TempSrc: Oral   SpO2: 99% 98%    GENERAL: The patient is a well-nourished female, in no acute distress. The vital signs are documented above. CARDIAC: There is a regular rate and rhythm.  VASCULAR: Pulsatility in left thigh dialysis graft PULMONARY: Non-labored respirations ABDOMEN: Soft and non-tender with normal pitched bowel sounds.  MUSCULOSKELETAL: There are no major deformities or cyanosis. NEUROLOGIC: No focal weakness or paresthesias are detected. SKIN: There are no ulcers or rashes noted. PSYCHIATRIC: The patient has a normal affect.  STUDIES:     MEDICAL ISSUES:   ESRD: This is a 48 year old female with left thigh dialysis graft and is having trouble with her access.  On examination, the graft appears pulsatile.  She has undergone venoplasty of the outflow tract in July of this year.  I think this needs to be repeated.  I discussed the details of the procedure.  She wishes to proceed.    Malvina Serene CLORE, MD, FACS [facility] [phone] Pager [phone]

## 2024-01-22 NOTE — Op Note (Signed)
    Patient name: [name] MRN: [id number] DOB: 1975-11-06 Sex: female  01/22/2024 Pre-operative Diagnosis: ESRD Post-operative diagnosis:  Same Surgeon:  Malvina New Procedure Performed:  1.  Ultrasound-guided access of the left thigh dialysis graft  2.  Shuntogram  3.  Balloon venoplasty, left common femoral vein (peripheral)   Indications: This is a 48 year old with end-stage renal disease who is having trouble with access flow rates.  She comes in today for further evaluation and possible intervention.  Procedure:  The patient was identified in the holding area and taken to room 8.  The patient was then placed supine on the table and prepped and draped in the usual sterile fashion.  A time out was called.  Ultrasound was used to evaluate the graft.  The vein was patent and compressible.  A digital ultrasound image was acquired.  The graft was then accessed under ultrasound guidance using a micropuncture needle.  An 018 wire was then asvanced without resistance and a micropuncture sheath was placed.  Contrast injections were then performed through the sheath.  Findings: Moderate segment stenosis at the venous outflow track in the common femoral vein.  The remaining portion of the fistula is widely patent   Intervention: After the above images were acquired the decision was made to proceed with intervention.  Over a Bentson wire a 6 French sheath was placed.  I selected a 9 x 60 Mustang balloon and performed balloon venoplasty of the venous anastomosis in the common femoral vein.  The balloon was taken to greater than 20 atm.  Completion imaging revealed significantly improved results.  Residual stenosis was approximately 15%.  At this time I elected not to upsize the balloon.  Catheters and wires were removed.  Monocryl was used for sheath removal.  There are no complications  Impression:  #1  Successful balloon venoplasty of the venous outflow tract using a 9 mm balloon  #2   Access remains amenable to future percutaneous intervention     V. Malvina New, M.D., [facility] [place] Office: [phone] Pager:  [phone]

## 2024-01-25 ENCOUNTER — Encounter (HOSPITAL_COMMUNITY): Payer: Self-pay | Admitting: Surgery

## 2024-03-03 ENCOUNTER — Encounter (INDEPENDENT_AMBULATORY_CARE_PROVIDER_SITE_OTHER): Payer: Self-pay | Admitting: Gastroenterology

## 2024-03-03 ENCOUNTER — Ambulatory Visit (INDEPENDENT_AMBULATORY_CARE_PROVIDER_SITE_OTHER): Admitting: Gastroenterology
